# Patient Record
Sex: Male | Born: 1955 | Race: White | Hispanic: No | Smoking: Current every day smoker
Health system: Southern US, Community
[De-identification: ages and names within clinical notes are randomized; demographics above are authoritative.]

## PROBLEM LIST (undated history)

## (undated) ENCOUNTER — Emergency Department (HOSPITAL_COMMUNITY): Admission: EM | Source: Home / Self Care

## (undated) DIAGNOSIS — Z91148 Patient's other noncompliance with medication regimen for other reason: Secondary | ICD-10-CM

## (undated) DIAGNOSIS — B1920 Unspecified viral hepatitis C without hepatic coma: Secondary | ICD-10-CM

## (undated) DIAGNOSIS — G8929 Other chronic pain: Secondary | ICD-10-CM

## (undated) DIAGNOSIS — R569 Unspecified convulsions: Secondary | ICD-10-CM

## (undated) DIAGNOSIS — I10 Essential (primary) hypertension: Secondary | ICD-10-CM

## (undated) DIAGNOSIS — M1990 Unspecified osteoarthritis, unspecified site: Secondary | ICD-10-CM

## (undated) DIAGNOSIS — C61 Malignant neoplasm of prostate: Secondary | ICD-10-CM

## (undated) DIAGNOSIS — G4700 Insomnia, unspecified: Secondary | ICD-10-CM

## (undated) DIAGNOSIS — F329 Major depressive disorder, single episode, unspecified: Secondary | ICD-10-CM

## (undated) DIAGNOSIS — F1910 Other psychoactive substance abuse, uncomplicated: Secondary | ICD-10-CM

## (undated) DIAGNOSIS — F419 Anxiety disorder, unspecified: Secondary | ICD-10-CM

## (undated) DIAGNOSIS — D696 Thrombocytopenia, unspecified: Secondary | ICD-10-CM

## (undated) DIAGNOSIS — F32A Depression, unspecified: Secondary | ICD-10-CM

## (undated) DIAGNOSIS — E43 Unspecified severe protein-calorie malnutrition: Secondary | ICD-10-CM

## (undated) DIAGNOSIS — Z9114 Patient's other noncompliance with medication regimen: Secondary | ICD-10-CM

## (undated) DIAGNOSIS — M109 Gout, unspecified: Secondary | ICD-10-CM

## (undated) DIAGNOSIS — S0990XA Unspecified injury of head, initial encounter: Secondary | ICD-10-CM

## (undated) DIAGNOSIS — E119 Type 2 diabetes mellitus without complications: Secondary | ICD-10-CM

## (undated) DIAGNOSIS — M25522 Pain in left elbow: Secondary | ICD-10-CM

## (undated) HISTORY — PX: MULTIPLE TOOTH EXTRACTIONS: SHX2053

## (undated) HISTORY — PX: OTHER SURGICAL HISTORY: SHX169

## (undated) HISTORY — DX: Type 2 diabetes mellitus without complications: E11.9

## (undated) HISTORY — DX: Gout, unspecified: M10.9

## (undated) HISTORY — DX: Unspecified osteoarthritis, unspecified site: M19.90

## (undated) HISTORY — DX: Anxiety disorder, unspecified: F41.9

---

## 2002-06-02 ENCOUNTER — Emergency Department (HOSPITAL_COMMUNITY): Admission: EM | Admit: 2002-06-02 | Discharge: 2002-06-02 | Payer: Self-pay | Admitting: *Deleted

## 2002-06-02 ENCOUNTER — Encounter: Payer: Self-pay | Admitting: Emergency Medicine

## 2007-10-24 ENCOUNTER — Emergency Department (HOSPITAL_COMMUNITY): Admission: EM | Admit: 2007-10-24 | Discharge: 2007-10-24 | Payer: Self-pay | Admitting: Emergency Medicine

## 2011-03-27 LAB — BASIC METABOLIC PANEL
BUN: 17
CO2: 22
Calcium: 9.3
Chloride: 102
Creatinine, Ser: 1.35
GFR calc Af Amer: >60
GFR calc non Af Amer: 55 — ABNORMAL LOW
Glucose, Bld: 73
Potassium: 3.8
Sodium: 137

## 2011-03-27 LAB — DIFFERENTIAL
Basophils Absolute: 0
Basophils Relative: 0
Eosinophils Absolute: 0.2
Eosinophils Relative: 1
Lymphocytes Relative: 30
Lymphs Abs: 3.7
Monocytes Absolute: 1.2 — ABNORMAL HIGH
Monocytes Relative: 10
Neutro Abs: 7.2
Neutrophils Relative %: 58

## 2011-03-27 LAB — RAPID URINE DRUG SCREEN, HOSP PERFORMED
Amphetamines: POSITIVE — AB
Barbiturates: NOT DETECTED
Benzodiazepines: POSITIVE — AB
Cocaine: POSITIVE — AB
Opiates: NOT DETECTED
Tetrahydrocannabinol: POSITIVE — AB

## 2011-03-27 LAB — CBC
HCT: 44.8
Hemoglobin: 15.7
MCHC: 35.1
MCV: 95
Platelets: 182
RBC: 4.72
RDW: 13.7
WBC: 12.4 — ABNORMAL HIGH

## 2011-03-27 LAB — ETHANOL: Alcohol, Ethyl (B): 96 — ABNORMAL HIGH

## 2012-05-03 ENCOUNTER — Encounter (HOSPITAL_COMMUNITY): Payer: Self-pay

## 2012-05-03 ENCOUNTER — Emergency Department (HOSPITAL_COMMUNITY)
Admission: EM | Admit: 2012-05-03 | Discharge: 2012-05-03 | Discharge status: Against Medical Advice | Payer: Self-pay | Attending: Emergency Medicine | Admitting: Emergency Medicine

## 2012-05-03 DIAGNOSIS — Y9289 Other specified places as the place of occurrence of the external cause: Secondary | ICD-10-CM | POA: Insufficient documentation

## 2012-05-03 DIAGNOSIS — Y939 Activity, unspecified: Secondary | ICD-10-CM | POA: Insufficient documentation

## 2012-05-03 DIAGNOSIS — F1010 Alcohol abuse, uncomplicated: Secondary | ICD-10-CM | POA: Insufficient documentation

## 2012-05-03 DIAGNOSIS — IMO0002 Reserved for concepts with insufficient information to code with codable children: Secondary | ICD-10-CM | POA: Insufficient documentation

## 2012-05-03 DIAGNOSIS — S0180XA Unspecified open wound of other part of head, initial encounter: Secondary | ICD-10-CM | POA: Insufficient documentation
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
## 2013-06-12 ENCOUNTER — Other Ambulatory Visit (HOSPITAL_COMMUNITY): Payer: Self-pay | Admitting: Internal Medicine

## 2013-06-12 ENCOUNTER — Ambulatory Visit (HOSPITAL_COMMUNITY)
Admission: RE | Admit: 2013-06-12 | Discharge: 2013-06-12 | Disposition: A | Discharge status: Home / Self Care | Payer: Medicaid Other | Source: Ambulatory Visit | Attending: Internal Medicine | Admitting: Internal Medicine

## 2013-06-12 DIAGNOSIS — I10 Essential (primary) hypertension: Secondary | ICD-10-CM

## 2013-06-12 DIAGNOSIS — J40 Bronchitis, not specified as acute or chronic: Secondary | ICD-10-CM | POA: Insufficient documentation

## 2013-06-12 DIAGNOSIS — E119 Type 2 diabetes mellitus without complications: Secondary | ICD-10-CM | POA: Insufficient documentation

## 2013-06-12 DIAGNOSIS — F17200 Nicotine dependence, unspecified, uncomplicated: Secondary | ICD-10-CM | POA: Insufficient documentation

## 2013-06-22 ENCOUNTER — Other Ambulatory Visit (HOSPITAL_COMMUNITY): Payer: Self-pay | Admitting: Internal Medicine

## 2013-06-22 DIAGNOSIS — R911 Solitary pulmonary nodule: Secondary | ICD-10-CM

## 2013-06-24 ENCOUNTER — Other Ambulatory Visit (HOSPITAL_COMMUNITY): Payer: Medicaid Other

## 2013-06-29 ENCOUNTER — Other Ambulatory Visit (HOSPITAL_COMMUNITY): Payer: Medicaid Other

## 2013-06-30 ENCOUNTER — Ambulatory Visit (HOSPITAL_COMMUNITY)
Admission: RE | Admit: 2013-06-30 | Discharge: 2013-06-30 | Disposition: A | Discharge status: Home / Self Care | Payer: Medicaid Other | Source: Ambulatory Visit | Attending: Internal Medicine | Admitting: Internal Medicine

## 2013-06-30 DIAGNOSIS — R911 Solitary pulmonary nodule: Secondary | ICD-10-CM

## 2013-07-03 ENCOUNTER — Telehealth: Payer: Self-pay

## 2013-07-03 ENCOUNTER — Other Ambulatory Visit: Payer: Self-pay

## 2013-07-03 DIAGNOSIS — Z1211 Encounter for screening for malignant neoplasm of colon: Secondary | ICD-10-CM

## 2013-07-09 ENCOUNTER — Encounter (HOSPITAL_COMMUNITY): Payer: Self-pay | Admitting: Pharmacy Technician

## 2013-07-17 NOTE — Telephone Encounter (Signed)
Gastroenterology Pre-Procedure Review  Request Date: 07/03/2013 Requesting Physician: Dr. Legrand Rams  PATIENT REVIEW QUESTIONS: The patient responded to the following health history questions as indicated:    1. Diabetes Melitis: YES 2. Joint replacements in the past 12 months: no 3. Major health problems in the past 3 months: no 4. Has an artificial valve or MVP: no 5. Has a defibrillator: no 6. Has been advised in past to take antibiotics in advance of a procedure like teeth cleaning: no    MEDICATIONS & ALLERGIES:    Patient reports the following regarding taking any blood thinners:   Plavix? no Aspirin? no Coumadin? no  Patient confirms/reports the following medications:  Current Outpatient Prescriptions  Medication Sig Dispense Refill  . indomethacin (INDOCIN) 25 MG capsule Take 50 mg by mouth 3 (three) times daily.      Marland Kitchen lisinopril (PRINIVIL,ZESTRIL) 2.5 MG tablet Take 2.5 mg by mouth daily.      . metFORMIN (GLUCOPHAGE) 500 MG tablet Take 500 mg by mouth 2 (two) times daily with a meal.      . traZODone (DESYREL) 150 MG tablet Take 150 mg by mouth daily.      Marland Kitchen ALPRAZolam (XANAX) 0.5 MG tablet Take 0.5 mg by mouth at bedtime.      . meloxicam (MOBIC) 7.5 MG tablet Take 7.5 mg by mouth daily.      . nabumetone (RELAFEN) 750 MG tablet Take 750 mg by mouth daily.       No current facility-administered medications for this visit.    Patient confirms/reports the following allergies:  No Known Allergies  No orders of the defined types were placed in this encounter.    AUTHORIZATION INFORMATION Primary Insurance:   ID #:   Group #:  Pre-Cert / Auth required: Pre-Cert / Auth #:   Secondary Insurance:   ID #:  Group #:  Pre-Cert / Auth required: Pre-Cert / Auth #:   SCHEDULE INFORMATION: Procedure has been scheduled as follows:  Date: 07/24/2013                   Time: 9:30 AM  Location: [facility] Short Stay  This Gastroenterology Pre-Precedure Review Form is  being routed to the following provider(s): Barney Drain, MD

## 2013-07-17 NOTE — Telephone Encounter (Signed)
Discussed especially the fact that he has to have someone with him that is capable of signing him out and driving him home, and if not the hospital will cancel his procedure even if he shows up.

## 2013-07-21 ENCOUNTER — Other Ambulatory Visit: Payer: Self-pay

## 2013-07-21 DIAGNOSIS — Z79899 Other long term (current) drug therapy: Secondary | ICD-10-CM

## 2013-07-21 MED ORDER — PEG-KCL-NACL-NASULF-NA ASC-C 100 G PO SOLR: 1.0000 | ORAL | Status: DC

## 2013-07-21 NOTE — Telephone Encounter (Signed)
MOVI PREP SPLIT DOSING, REGULAR BREAKFAST. CLEAR LIQUIDS AFTER 9 AM.  CONTINUE GLUCOPHAGE.  Needs Phenergan 12. 5 mg iv in Preop.

## 2013-07-21 NOTE — Telephone Encounter (Signed)
Rx and instructions sent to the pharmacy.

## 2013-07-22 NOTE — Telephone Encounter (Signed)
Called the pharmacy and they said he had just came and picked up his Rx and instructions.

## 2013-07-22 NOTE — Telephone Encounter (Signed)
Phenergan order added.  

## 2013-07-22 NOTE — Telephone Encounter (Signed)
Many phone calls attempted today to let pt know his instructions were at the pharmacy. Vm full and could not leave message.

## 2013-07-23 ENCOUNTER — Telehealth: Payer: Self-pay

## 2013-07-23 NOTE — Telephone Encounter (Signed)
Per Darius Bump, Melanie called from Endo and said pt was mixed up about his appt. I called pt and he said he thought it was supposed to be next Thurs. I told him no, he was scheduled for 07/23/2013 and I had sent his instructions to the pharmacy for him. He picked it up yesterday, but he hasn't even opened the bag. I rescheduled it to 07/31/2013 per pt request at 9:15 and [facility] for Kim. He will call if any problems.He will just change the date on his paperwork.

## 2013-07-24 NOTE — Telephone Encounter (Signed)
REVIEWED.  

## 2013-07-27 ENCOUNTER — Telehealth: Payer: Self-pay

## 2013-07-27 NOTE — Telephone Encounter (Signed)
Pt called to cancel his TCS on Friday 07/31/13. He said he just does not want to have it done.

## 2013-07-31 ENCOUNTER — Encounter (HOSPITAL_COMMUNITY): Admission: RE | Payer: Self-pay | Source: Ambulatory Visit

## 2013-07-31 ENCOUNTER — Ambulatory Visit (HOSPITAL_COMMUNITY): Admission: RE | Admit: 2013-07-31 | Payer: Medicaid Other | Source: Ambulatory Visit | Admitting: Gastroenterology

## 2013-07-31 SURGERY — COLONOSCOPY
Anesthesia: Moderate Sedation

## 2013-10-15 ENCOUNTER — Ambulatory Visit (HOSPITAL_COMMUNITY)
Admission: RE | Admit: 2013-10-15 | Discharge: 2013-10-15 | Disposition: A | Discharge status: Home / Self Care | Payer: Medicaid Other | Source: Ambulatory Visit | Attending: Internal Medicine | Admitting: Internal Medicine

## 2013-10-15 ENCOUNTER — Other Ambulatory Visit (HOSPITAL_COMMUNITY): Payer: Self-pay | Admitting: Internal Medicine

## 2013-10-15 DIAGNOSIS — M549 Dorsalgia, unspecified: Secondary | ICD-10-CM

## 2013-10-15 DIAGNOSIS — M545 Low back pain, unspecified: Secondary | ICD-10-CM | POA: Insufficient documentation

## 2013-10-15 DIAGNOSIS — M47817 Spondylosis without myelopathy or radiculopathy, lumbosacral region: Secondary | ICD-10-CM | POA: Insufficient documentation

## 2013-11-05 ENCOUNTER — Ambulatory Visit (INDEPENDENT_AMBULATORY_CARE_PROVIDER_SITE_OTHER): Payer: Medicaid Other | Admitting: Orthopedic Surgery

## 2013-11-05 VITALS — BP 113/73 | Ht 67.0 in | Wt 177.0 lb

## 2013-11-05 DIAGNOSIS — M545 Low back pain, unspecified: Secondary | ICD-10-CM

## 2013-11-05 DIAGNOSIS — M47812 Spondylosis without myelopathy or radiculopathy, cervical region: Secondary | ICD-10-CM | POA: Insufficient documentation

## 2013-11-05 DIAGNOSIS — M542 Cervicalgia: Secondary | ICD-10-CM

## 2013-11-05 DIAGNOSIS — M48061 Spinal stenosis, lumbar region without neurogenic claudication: Secondary | ICD-10-CM | POA: Insufficient documentation

## 2013-11-05 MED ORDER — TRAMADOL-ACETAMINOPHEN 37.5-325 MG PO TABS: 1.0000 | ORAL_TABLET | ORAL | Status: DC | PRN

## 2013-11-05 MED ORDER — METHOCARBAMOL 500 MG PO TABS: 500.0000 mg | ORAL_TABLET | Freq: Four times a day (QID) | ORAL | Status: DC | PRN

## 2013-11-05 NOTE — Patient Instructions (Signed)
Chronic Back Pain  When back pain lasts longer than 3 months, it is called chronic back pain.People with chronic back pain often go through certain periods that are more intense (flare-ups).  CAUSES Chronic back pain can be caused by wear and tear (degeneration) on different structures in your back. These structures include:  The bones of your spine (vertebrae) and the joints surrounding your spinal cord and nerve roots (facets).  The strong, fibrous tissues that connect your vertebrae (ligaments). Degeneration of these structures may result in pressure on your nerves. This can lead to constant pain. HOME CARE INSTRUCTIONS  Avoid bending, heavy lifting, prolonged sitting, and activities which make the problem worse.  Take brief periods of rest throughout the day to reduce your pain. Lying down or standing usually is better than sitting while you are resting.  Take over-the-counter or prescription medicines only as directed by your caregiver. SEEK IMMEDIATE MEDICAL CARE IF:   You have weakness or numbness in one of your legs or feet.  You have trouble controlling your bladder or bowels.  You have nausea, vomiting, abdominal pain, shortness of breath, or fainting. Document Released: 07/26/2004 Document Revised: 09/10/2011 Document Reviewed: 06/02/2011 [facility] Patient Information 2014 [place], [place].    Physical therapy  Dose pack   ultracet   Robaxin   F/u with primary care doctor
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
## 2013-11-19 ENCOUNTER — Ambulatory Visit (HOSPITAL_COMMUNITY): Payer: Medicaid Other | Attending: Orthopedic Surgery | Admitting: Physical Therapy

## 2013-12-01 ENCOUNTER — Ambulatory Visit (HOSPITAL_COMMUNITY): Payer: Medicaid Other | Admitting: Physical Therapy

## 2013-12-07 ENCOUNTER — Ambulatory Visit (HOSPITAL_COMMUNITY): Payer: Medicaid Other | Admitting: Physical Therapy

## 2013-12-10 ENCOUNTER — Ambulatory Visit (INDEPENDENT_AMBULATORY_CARE_PROVIDER_SITE_OTHER): Payer: Medicaid Other | Admitting: Otolaryngology

## 2013-12-10 DIAGNOSIS — H6120 Impacted cerumen, unspecified ear: Secondary | ICD-10-CM

## 2013-12-10 DIAGNOSIS — H9319 Tinnitus, unspecified ear: Secondary | ICD-10-CM

## 2013-12-10 DIAGNOSIS — H903 Sensorineural hearing loss, bilateral: Secondary | ICD-10-CM

## 2013-12-28 ENCOUNTER — Other Ambulatory Visit: Payer: Self-pay | Admitting: *Deleted

## 2013-12-28 ENCOUNTER — Ambulatory Visit (HOSPITAL_COMMUNITY): Payer: Medicaid Other | Attending: Orthopedic Surgery | Admitting: Physical Therapy

## 2013-12-28 MED ORDER — TRAMADOL-ACETAMINOPHEN 37.5-325 MG PO TABS: 1.0000 | ORAL_TABLET | ORAL | Status: DC | PRN

## 2014-06-29 ENCOUNTER — Emergency Department (HOSPITAL_COMMUNITY)
Admission: EM | Admit: 2014-06-29 | Discharge: 2014-06-30 | Disposition: A | Discharge status: Home / Self Care | Payer: Medicaid Other | Attending: Emergency Medicine | Admitting: Emergency Medicine

## 2014-06-29 ENCOUNTER — Encounter (HOSPITAL_COMMUNITY): Payer: Self-pay | Admitting: *Deleted

## 2014-06-29 DIAGNOSIS — Z79899 Other long term (current) drug therapy: Secondary | ICD-10-CM | POA: Diagnosis not present

## 2014-06-29 DIAGNOSIS — E1165 Type 2 diabetes mellitus with hyperglycemia: Secondary | ICD-10-CM | POA: Diagnosis present

## 2014-06-29 DIAGNOSIS — M1990 Unspecified osteoarthritis, unspecified site: Secondary | ICD-10-CM | POA: Insufficient documentation

## 2014-06-29 DIAGNOSIS — R739 Hyperglycemia, unspecified: Secondary | ICD-10-CM

## 2014-06-29 DIAGNOSIS — Z791 Long term (current) use of non-steroidal anti-inflammatories (NSAID): Secondary | ICD-10-CM | POA: Diagnosis not present

## 2014-06-29 DIAGNOSIS — Z720 Tobacco use: Secondary | ICD-10-CM | POA: Diagnosis not present

## 2014-06-29 LAB — CBC WITH DIFFERENTIAL/PLATELET
Basophils Absolute: 0 10*3/uL (ref 0.0–0.1)
Basophils Relative: 1 % (ref 0–1)
EOS ABS: 0.1 10*3/uL (ref 0.0–0.7)
EOS PCT: 1 % (ref 0–5)
HCT: 41 % (ref 39.0–52.0)
Hemoglobin: 15.1 g/dL (ref 13.0–17.0)
Lymphocytes Relative: 35 % (ref 12–46)
Lymphs Abs: 1.9 10*3/uL (ref 0.7–4.0)
MCH: 35.9 pg — AB (ref 26.0–34.0)
MCHC: 36.8 g/dL — ABNORMAL HIGH (ref 30.0–36.0)
MCV: 97.4 fL (ref 78.0–100.0)
MONO ABS: 0.5 10*3/uL (ref 0.1–1.0)
Monocytes Relative: 9 % (ref 3–12)
NEUTROS ABS: 3 10*3/uL (ref 1.7–7.7)
Neutrophils Relative %: 54 % (ref 43–77)
Platelets: 67 10*3/uL — ABNORMAL LOW (ref 150–400)
RBC: 4.21 MIL/uL — ABNORMAL LOW (ref 4.22–5.81)
RDW: 12.8 % (ref 11.5–15.5)
WBC: 5.4 10*3/uL (ref 4.0–10.5)

## 2014-06-29 LAB — COMPREHENSIVE METABOLIC PANEL
ALT: 78 U/L — AB (ref 0–53)
AST: 74 U/L — ABNORMAL HIGH (ref 0–37)
Albumin: 3.2 g/dL — ABNORMAL LOW (ref 3.5–5.2)
Alkaline Phosphatase: 110 U/L (ref 39–117)
Anion gap: 6 (ref 5–15)
BUN: 23 mg/dL (ref 6–23)
CO2: 24 mmol/L (ref 19–32)
CREATININE: 1.58 mg/dL — AB (ref 0.50–1.35)
Calcium: 8.7 mg/dL (ref 8.4–10.5)
Chloride: 98 mEq/L (ref 96–112)
GFR calc Af Amer: 54 mL/min — ABNORMAL LOW (ref >90)
GFR calc non Af Amer: 46 mL/min — ABNORMAL LOW (ref >90)
Glucose, Bld: 661 mg/dL (ref 70–99)
Potassium: 4.6 mmol/L (ref 3.5–5.1)
Sodium: 128 mmol/L — ABNORMAL LOW (ref 135–145)
Total Bilirubin: 0.7 mg/dL (ref 0.3–1.2)
Total Protein: 6.9 g/dL (ref 6.0–8.3)

## 2014-06-29 LAB — CBG MONITORING, ED
Glucose-Capillary: 389 mg/dL — ABNORMAL HIGH (ref 70–99)
Glucose-Capillary: >600 mg/dL (ref 70–99)

## 2014-06-29 LAB — TROPONIN I: Troponin I: <0.03 ng/mL (ref <0.031)

## 2014-06-29 MED ORDER — SODIUM CHLORIDE 0.9 % IV BOLUS (SEPSIS)
2000.0000 mL | Freq: Once | INTRAVENOUS | Status: AC
  Administered 2014-06-29: 2000 mL via INTRAVENOUS

## 2014-06-29 MED ORDER — INSULIN ASPART 100 UNIT/ML IV SOLN
10.0000 [IU] | Freq: Once | INTRAVENOUS | Status: AC
  Administered 2014-06-29: 10 [IU] via INTRAVENOUS

## 2014-06-29 NOTE — ED Notes (Signed)
CRITICAL VALUE ALERT  Critical value received:  Glucose 661  Date of notification:  06/29/2014  Time of notification:  2257  Critical value read back:Yes.    Nurse who received alert:  Holli Humbles  MD notified (1st page):  Zammitt  Time of first page:  2258  MD notified (2nd page):  Time of second page:  Responding MD:  Zammitt  Time MD responded:  2258

## 2014-06-29 NOTE — ED Provider Notes (Signed)
CSN: [id number]     Arrival date & time 06/29/14  2118 History  This chart was scribed for Orpah Greek, MD by Martinique Peace, ED Scribe. The patient was seen in [place]. The patient's care was started at 11:03 PM.    Chief Complaint  Patient presents with  . Hyperglycemia      Patient is a 58 y.o. male presenting with hyperglycemia. The history is provided by the patient. No language interpreter was used.  Hyperglycemia Blood sugar level PTA:  Above 500 Onset quality:  Sudden Progression:  Unchanged Relieved by:  None tried Associated symptoms: no blurred vision, no chest pain, no fever and no shortness of breath    HPI Comments: Juan Juarez is a 58 y.o. male who presents to the Emergency Department complaining of hyperglycemia. Pt reports blood sugar was greater than 500 when he checked levels today. Pt states that he has been taking his medication like advised. He reports his legs have also been bothering him and adds that they feel as if something is "sticking him in the bottom of his feet". No complaints of cough, SOB, chest pain, fever, or diarrhea.  History of DM.   Past Medical History  Diagnosis Date  . Diabetes mellitus without complication   . Arthritis    History reviewed. No pertinent past surgical history. History reviewed. No pertinent family history. History  Substance Use Topics  . Smoking status: Current Every Day Smoker  . Smokeless tobacco: Not on file  . Alcohol Use: Yes    Review of Systems  Constitutional: Negative for fever.  Eyes: Negative for blurred vision.  Respiratory: Negative for cough and shortness of breath.   Cardiovascular: Negative for chest pain.       Hyperglycemia.   Gastrointestinal: Negative for diarrhea.  All other systems reviewed and are negative.     Allergies  Review of patient's allergies indicates no known allergies.  Home Medications   Prior to Admission medications   Medication Sig Start Date End  Date Taking? Authorizing Provider  allopurinol (ZYLOPRIM) 100 MG tablet Take 2 tablets by mouth daily as needed. For gout flare 04/09/14  Yes Historical Provider, MD  ALPRAZolam Duanne Moron) 0.5 MG tablet Take 0.5 mg by mouth at bedtime.   Yes Historical Provider, MD  lisinopril (PRINIVIL,ZESTRIL) 2.5 MG tablet Take 2.5 mg by mouth daily.   Yes Historical Provider, MD  meloxicam (MOBIC) 7.5 MG tablet Take 7.5 mg by mouth daily.   Yes Historical Provider, MD  TRADJENTA 5 MG TABS tablet Take 5 mg by mouth daily. 06/09/14  Yes Historical Provider, MD  methocarbamol (ROBAXIN) 500 MG tablet Take 1 tablet (500 mg total) by mouth every 6 (six) hours as needed for muscle spasms. Patient not taking: Reported on 06/29/2014 11/05/13   Carole Civil, MD  peg 3350 powder (MOVIPREP) 100 G SOLR Take 1 kit (200 g total) by mouth as directed. Patient not taking: Reported on 06/29/2014 07/21/13   Danie Binder, MD  traMADol-acetaminophen (ULTRACET) 37.5-325 MG per tablet Take 1 tablet by mouth every 4 (four) hours as needed. Patient not taking: Reported on 06/29/2014 12/28/13   Carole Civil, MD   BP 117/82 mmHg  Pulse 66  Temp(Src) 98 F (36.7 C) (Oral)  Resp 18  Ht $R'5\' 8"'SJ$  (1.727 m)  Wt 183 lb (83.008 kg)  BMI 27.83 kg/m2  SpO2 99% Physical Exam  Constitutional: He is oriented to person, place, and time. He appears well-developed and well-nourished. No  distress.  HENT:  Head: Normocephalic and atraumatic.  Right Ear: Hearing normal.  Left Ear: Hearing normal.  Nose: Nose normal.  Mouth/Throat: Oropharynx is clear and moist and mucous membranes are normal.  Eyes: Conjunctivae and EOM are normal. Pupils are equal, round, and reactive to light.  Neck: Normal range of motion. Neck supple.  Cardiovascular: Regular rhythm, S1 normal and S2 normal.  Exam reveals no gallop and no friction rub.   No murmur heard. Pulmonary/Chest: Effort normal and breath sounds normal. No respiratory distress. He exhibits no  tenderness.  Abdominal: Soft. Normal appearance and bowel sounds are normal. There is no hepatosplenomegaly. There is no tenderness. There is no rebound, no guarding, no tenderness at McBurney's point and negative Murphy's sign. No hernia.  Musculoskeletal: Normal range of motion.  Neurological: He is alert and oriented to person, place, and time. He has normal strength. No cranial nerve deficit or sensory deficit. Coordination normal. GCS eye subscore is 4. GCS verbal subscore is 5. GCS motor subscore is 6.  Skin: Skin is warm, dry and intact. No rash noted. No cyanosis.  Psychiatric: He has a normal mood and affect. His speech is normal and behavior is normal. Thought content normal.    ED Course  Procedures (including critical care time) Labs Review Labs Reviewed  CBC WITH DIFFERENTIAL - Abnormal; Notable for the following:    RBC 4.21 (*)    MCH 35.9 (*)    MCHC 36.8 (*)    Platelets 67 (*)    All other components within normal limits  COMPREHENSIVE METABOLIC PANEL - Abnormal; Notable for the following:    Sodium 128 (*)    Glucose, Bld 661 (*)    Creatinine, Ser 1.58 (*)    Albumin 3.2 (*)    AST 74 (*)    ALT 78 (*)    GFR calc non Af Amer 46 (*)    GFR calc Af Amer 54 (*)    All other components within normal limits  CBG MONITORING, ED - Abnormal; Notable for the following:    Glucose-Capillary >600 (*)    All other components within normal limits  I-STAT CHEM 8, ED - Abnormal; Notable for the following:    Sodium 129 (*)    BUN 25 (*)    Creatinine, Ser 1.50 (*)    Glucose, Bld 668 (*)    All other components within normal limits  CBG MONITORING, ED - Abnormal; Notable for the following:    Glucose-Capillary 389 (*)    All other components within normal limits  TROPONIN I    Imaging Review No results found.   EKG Interpretation   Date/Time:  Tuesday June 29 2014 23:12:32 EST Ventricular Rate:  69 PR Interval:  197 QRS Duration: 95 QT Interval:   409 QTC Calculation: 438 R Axis:   14 Text Interpretation:  Sinus rhythm Normal ECG Confirmed by Dia Jefferys  MD,  Eshal Propps [phone]) on 06/29/2014 11:49:03 PM     Medications  sodium chloride 0.9 % bolus 2,000 mL (0 mLs Intravenous Stopped 06/30/14 0102)  insulin aspart (novoLOG) injection 10 Units (10 Units Intravenous Given 06/29/14 2313)  insulin aspart (novoLOG) injection 15 Units (15 Units Subcutaneous Given 06/30/14 0105)   11:06 PM- Treatment plan was discussed with patient who verbalizes understanding and agrees.   MDM   Final diagnoses:  None   hyperglycemia  Patient presents to the ER for evaluation of elevated blood sugar. Elevation is of unclear etiology. Patient denies having any  recent illness. His workup was unremarkable. This included cardiac workup. He is not expressing any chest pain. He has not had any flu or upper respiratory symptoms. Patient treated with IV fluids and insulin with improvement. He does not have any evidence of ketosis or acidosis. Patient to be discharged, continue his medications and follow-up with his primary care doctor.  I personally performed the services described in this documentation, which was scribed in my presence. The recorded information has been reviewed and is accurate.   Orpah Greek, MD 06/30/14 [phone]

## 2014-06-29 NOTE — ED Notes (Signed)
Hyperglycemia , pt checked his cbg yesterday for first time in months .  Feels itchy

## 2014-06-29 NOTE — ED Notes (Signed)
Patient states blood sugar was greater than 500 today when checked at home. Patient states he has a history of diabetes, but is not compliant with following his blood sugars to regulate them properly. Patient A&OX4 at this time, no needs voiced.

## 2014-06-30 LAB — CBG MONITORING, ED
GLUCOSE-CAPILLARY: 358 mg/dL — AB (ref 70–99)
GLUCOSE-CAPILLARY: 164 mg/dL — AB (ref 70–99)

## 2014-06-30 LAB — I-STAT CHEM 8, ED
BUN: 25 mg/dL — AB (ref 6–23)
CALCIUM ION: 1.16 mmol/L (ref 1.12–1.23)
CHLORIDE: 97 meq/L (ref 96–112)
CREATININE: 1.5 mg/dL — AB (ref 0.50–1.35)
Glucose, Bld: 668 mg/dL (ref 70–99)
HCT: 46 % (ref 39.0–52.0)
Hemoglobin: 15.6 g/dL (ref 13.0–17.0)
Potassium: 4.6 mmol/L (ref 3.5–5.1)
Sodium: 129 mmol/L — ABNORMAL LOW (ref 135–145)
TCO2: 21 mmol/L (ref 0–100)

## 2014-06-30 MED ORDER — INSULIN ASPART 100 UNIT/ML ~~LOC~~ SOLN
10.0000 [IU] | Freq: Once | SUBCUTANEOUS | Status: AC
  Administered 2014-06-30: 10 [IU] via SUBCUTANEOUS
  Filled 2014-06-30: qty 1

## 2014-06-30 MED ORDER — INSULIN ASPART 100 UNIT/ML ~~LOC~~ SOLN
15.0000 [IU] | Freq: Once | SUBCUTANEOUS | Status: AC
Start: 2014-06-30 — End: 2014-06-30
  Administered 2014-06-30: 15 [IU] via SUBCUTANEOUS
  Filled 2014-06-30: qty 1

## 2014-06-30 MED ORDER — SODIUM CHLORIDE 0.9 % IV SOLN
Freq: Once | INTRAVENOUS | Status: AC
  Administered 2014-06-30: 03:00:00 via INTRAVENOUS

## 2014-06-30 NOTE — Discharge Instructions (Signed)
High Blood Sugar °High blood sugar (hyperglycemia) means that the level of sugar in your blood is higher than it should be. Signs of high blood sugar include: °· Feeling thirsty. °· Frequent peeing (urinating). °· Feeling tired or sleepy. °· Dry mouth. °· Vision changes. °· Feeling weak. °· Feeling hungry but losing weight. °· Numbness and tingling in your hands or feet. °· Headache. °When you ignore these signs, your blood sugar may keep going up. These problems may get worse, and other problems may begin. °HOME CARE °· Check your blood sugars as told by your doctor. Write down the numbers with the date and time. °· Take the right amount of insulin or diabetes pills at the right time. Write down the dose with date and time. °· Refill your insulin or diabetes pills before running out. °· Watch what you eat. Follow your meal plan. °· Drink liquids without sugar, such as water. Check with your doctor if you have kidney or heart disease. °· Follow your doctor's orders for exercise. Exercise at the same time of day. °· Keep your doctor's appointments. °GET HELP RIGHT AWAY IF:  °· You have trouble thinking or are confused. °· You have fast breathing with fruity smelling breath. °· You pass out (faint). °· You have 2 to 3 days of high blood sugars and you do not know why. °· You have chest pain. °· You are feeling sick to your stomach (nauseous) or throwing up (vomiting). °· You have sudden vision changes. °MAKE SURE YOU:  °· Understand these instructions. °· Will watch your condition. °· Will get help right away if you are not doing well or get worse. °Document Released: 04/15/2009 Document Revised: 09/10/2011 Document Reviewed: 04/15/2009 °[employer]® Patient Information ©2015 [employer]. This information is not intended to replace advice given to you by your health care provider. Make sure you discuss any questions you have with your health care provider. ° °

## 2014-06-30 NOTE — ED Notes (Signed)
Patient verbalizes understanding of discharge instructions, home care and follow up care. Patient ambulatory out of department at this time. 

## 2014-07-01 ENCOUNTER — Encounter (HOSPITAL_COMMUNITY): Payer: Self-pay | Admitting: *Deleted

## 2014-07-01 ENCOUNTER — Emergency Department (HOSPITAL_COMMUNITY)
Admission: EM | Admit: 2014-07-01 | Discharge: 2014-07-01 | Disposition: A | Discharge status: Home / Self Care | Payer: Medicaid Other | Attending: Emergency Medicine | Admitting: Emergency Medicine

## 2014-07-01 ENCOUNTER — Emergency Department (HOSPITAL_COMMUNITY): Payer: Medicaid Other

## 2014-07-01 DIAGNOSIS — Z79899 Other long term (current) drug therapy: Secondary | ICD-10-CM | POA: Insufficient documentation

## 2014-07-01 DIAGNOSIS — M1990 Unspecified osteoarthritis, unspecified site: Secondary | ICD-10-CM | POA: Diagnosis not present

## 2014-07-01 DIAGNOSIS — E1165 Type 2 diabetes mellitus with hyperglycemia: Secondary | ICD-10-CM | POA: Insufficient documentation

## 2014-07-01 DIAGNOSIS — R739 Hyperglycemia, unspecified: Secondary | ICD-10-CM

## 2014-07-01 DIAGNOSIS — Z720 Tobacco use: Secondary | ICD-10-CM | POA: Insufficient documentation

## 2014-07-01 LAB — COMPREHENSIVE METABOLIC PANEL
ALT: 89 U/L — ABNORMAL HIGH (ref 0–53)
AST: 84 U/L — ABNORMAL HIGH (ref 0–37)
Albumin: 3.3 g/dL — ABNORMAL LOW (ref 3.5–5.2)
Alkaline Phosphatase: 86 U/L (ref 39–117)
Anion gap: 6 (ref 5–15)
BILIRUBIN TOTAL: 0.9 mg/dL (ref 0.3–1.2)
BUN: 14 mg/dL (ref 6–23)
CALCIUM: 8.7 mg/dL (ref 8.4–10.5)
CO2: 25 mmol/L (ref 19–32)
Chloride: 99 mEq/L (ref 96–112)
Creatinine, Ser: 1.22 mg/dL (ref 0.50–1.35)
GFR calc Af Amer: 73 mL/min — ABNORMAL LOW (ref >90)
GFR, EST NON AFRICAN AMERICAN: 63 mL/min — AB (ref >90)
Glucose, Bld: 550 mg/dL — ABNORMAL HIGH (ref 70–99)
Potassium: 4.4 mmol/L (ref 3.5–5.1)
SODIUM: 130 mmol/L — AB (ref 135–145)
TOTAL PROTEIN: 6.9 g/dL (ref 6.0–8.3)

## 2014-07-01 LAB — CBG MONITORING, ED
GLUCOSE-CAPILLARY: 527 mg/dL — AB (ref 70–99)
GLUCOSE-CAPILLARY: 388 mg/dL — AB (ref 70–99)

## 2014-07-01 LAB — CBC WITH DIFFERENTIAL/PLATELET
BASOS ABS: 0 10*3/uL (ref 0.0–0.1)
Basophils Relative: 1 % (ref 0–1)
EOS PCT: 1 % (ref 0–5)
Eosinophils Absolute: 0 10*3/uL (ref 0.0–0.7)
HCT: 40.7 % (ref 39.0–52.0)
Hemoglobin: 14.5 g/dL (ref 13.0–17.0)
LYMPHS PCT: 36 % (ref 12–46)
Lymphs Abs: 1.8 10*3/uL (ref 0.7–4.0)
MCH: 34.9 pg — AB (ref 26.0–34.0)
MCHC: 35.6 g/dL (ref 30.0–36.0)
MCV: 97.8 fL (ref 78.0–100.0)
Monocytes Absolute: 0.5 10*3/uL (ref 0.1–1.0)
Monocytes Relative: 10 % (ref 3–12)
NEUTROS ABS: 2.6 10*3/uL (ref 1.7–7.7)
Neutrophils Relative %: 52 % (ref 43–77)
PLATELETS: 58 10*3/uL — AB (ref 150–400)
RBC: 4.16 MIL/uL — ABNORMAL LOW (ref 4.22–5.81)
RDW: 12.8 % (ref 11.5–15.5)
Smear Review: DECREASED
WBC: 5 10*3/uL (ref 4.0–10.5)

## 2014-07-01 LAB — URINE MICROSCOPIC-ADD ON: WBC, UA: NONE SEEN WBC/hpf (ref <3)

## 2014-07-01 LAB — URINALYSIS, ROUTINE W REFLEX MICROSCOPIC
BILIRUBIN URINE: NEGATIVE
Glucose, UA: >1000 mg/dL — AB
HGB URINE DIPSTICK: NEGATIVE
Ketones, ur: NEGATIVE mg/dL
Leukocytes, UA: NEGATIVE
Nitrite: NEGATIVE
PROTEIN: NEGATIVE mg/dL
Specific Gravity, Urine: 1.01 (ref 1.005–1.030)
Urobilinogen, UA: 0.2 mg/dL (ref 0.0–1.0)
pH: 6 (ref 5.0–8.0)

## 2014-07-01 MED ORDER — SODIUM CHLORIDE 0.9 % IV BOLUS (SEPSIS)
1000.0000 mL | Freq: Once | INTRAVENOUS | Status: AC
  Administered 2014-07-01: 1000 mL via INTRAVENOUS

## 2014-07-01 MED ORDER — SODIUM CHLORIDE 0.9 % IV SOLN
INTRAVENOUS | Status: DC
  Filled 2014-07-01: qty 2.5

## 2014-07-01 MED ORDER — METFORMIN HCL 500 MG PO TABS
500.0000 mg | ORAL_TABLET | Freq: Once | ORAL | Status: AC
  Administered 2014-07-01: 500 mg via ORAL
  Filled 2014-07-01: qty 1

## 2014-07-01 MED ORDER — METFORMIN HCL 1000 MG PO TABS: 500.0000 mg | ORAL_TABLET | Freq: Two times a day (BID) | ORAL | Status: DC

## 2014-07-01 NOTE — Discharge Instructions (Signed)

## 2014-07-01 NOTE — ED Notes (Signed)
Patient reports he has had high blood sugars "for some time".  States he was treated here for same 2 nights ago, but the next morning BG was over 300.  Denies any changes in medications, diet or feeling ill.  Has been on Tradjenta x 6 months and states he has been taking it every morning.

## 2014-07-01 NOTE — ED Notes (Signed)
Patient verbalizes understanding of discharge instructions, prescription medications, home care, and follow up care. Patient ambulatory out of department at this time. 

## 2014-07-01 NOTE — ED Provider Notes (Signed)
CSN: [id number]     Arrival date & time 07/01/14  2052 History  This chart was scribed for Shaune Pollack, MD by Jeanell Sparrow, ED Scribe. This patient was seen in room APA03/APA03 and the patient's care was started at 9:25 PM.   Chief Complaint  Patient presents with  . Hyperglycemia   HPI Comments: Reports baseline bs are 100s but elevated today.   Patient is a 58 y.o. male presenting with hyperglycemia. The history is provided by the patient. No language interpreter was used.  Hyperglycemia Blood sugar level PTA:  100-200 Severity:  Moderate Onset quality:  Gradual Duration:  2 days Timing:  Intermittent Progression:  Unchanged Chronicity:  Chronic Diabetes status:  Controlled with oral medications Current diabetic therapy:  Tradjenta Context: change in medication   Relieved by:  None tried Ineffective treatments:  None tried Associated symptoms: no fever    HPI Comments: Juan Juarez is a 58 y.o. male who presents to the Emergency Department complaining of hyperglycemia that started 2 days ago. He reports that he has been having high blood sugar for a while because his tradjenta he has been on for 3-4 months is not working. He states that he came to the ED 2 nights ago with a blood sugar level of 668 and sent home with tradjenta. He reports that today he checked his blood sugar today and it was high, ranging from 100 to 200. He states that his DM is usually treated with metformin. He denies any fever, chills, or cough.   Past Medical History  Diagnosis Date  . Diabetes mellitus without complication   . Arthritis    History reviewed. No pertinent past surgical history. History reviewed. No pertinent family history. History  Substance Use Topics  . Smoking status: Current Every Day Smoker  . Smokeless tobacco: Not on file  . Alcohol Use: Yes    Review of Systems  Constitutional: Negative for fever and chills.  Respiratory: Negative for cough.   All other systems  reviewed and are negative.   Allergies  Review of patient's allergies indicates no known allergies.  Home Medications   Prior to Admission medications   Medication Sig Start Date End Date Taking? Authorizing Provider  allopurinol (ZYLOPRIM) 100 MG tablet Take 2 tablets by mouth daily as needed. For gout flare 04/09/14  Yes Historical Provider, MD  ALPRAZolam Duanne Moron) 0.5 MG tablet Take 0.5 mg by mouth at bedtime.   Yes Historical Provider, MD  lisinopril (PRINIVIL,ZESTRIL) 2.5 MG tablet Take 2.5 mg by mouth daily.   Yes Historical Provider, MD  meloxicam (MOBIC) 7.5 MG tablet Take 7.5 mg by mouth daily.   Yes Historical Provider, MD  TRADJENTA 5 MG TABS tablet Take 5 mg by mouth daily. 06/09/14  Yes Historical Provider, MD  traMADol-acetaminophen (ULTRACET) 37.5-325 MG per tablet Take 1 tablet by mouth every 4 (four) hours as needed. Patient not taking: Reported on 06/29/2014 12/28/13   Carole Civil, MD   BP 120/81 mmHg  Pulse 77  Temp(Src) 98.9 F (37.2 C) (Oral)  Resp 20  Ht 5\' 8"  (1.727 m)  Wt 183 lb (83.008 kg)  BMI 27.83 kg/m2  SpO2 100% Physical Exam  Constitutional: He is oriented to person, place, and time. He appears well-developed and well-nourished. No distress.  HENT:  Head: Normocephalic and atraumatic.  Right Ear: External ear normal.  Left Ear: External ear normal.  Nose: Nose normal.  Mouth/Throat: Oropharynx is clear and moist.  Eyes: Conjunctivae and EOM  are normal. Pupils are equal, round, and reactive to light.  Neck: Normal range of motion. Neck supple. No JVD present. No tracheal deviation present. No thyromegaly present.  Cardiovascular: Normal rate, regular rhythm, normal heart sounds and intact distal pulses.   Pulmonary/Chest: Effort normal and breath sounds normal. No stridor. No respiratory distress.  Abdominal: Soft. Bowel sounds are normal. He exhibits no distension. There is no tenderness.  Musculoskeletal: Normal range of motion.   Lymphadenopathy:    He has no cervical adenopathy.  Neurological: He is alert and oriented to person, place, and time.  Skin: Skin is warm and dry.  Psychiatric: He has a normal mood and affect. His behavior is normal. Judgment and thought content normal.  Nursing note and vitals reviewed.   ED Course  Procedures (including critical care time) DIAGNOSTIC STUDIES: Oxygen Saturation is 100% on RA, normal by my interpretation.    COORDINATION OF CARE: 9:29 PM- Pt advised of plan for treatment which includes medication and labs and pt agrees.  Labs Review Labs Reviewed  CBC WITH DIFFERENTIAL - Abnormal; Notable for the following:    RBC 4.16 (*)    MCH 34.9 (*)    Platelets 58 (*)    All other components within normal limits  COMPREHENSIVE METABOLIC PANEL - Abnormal; Notable for the following:    Sodium 130 (*)    Glucose, Bld 550 (*)    Albumin 3.3 (*)    AST 84 (*)    ALT 89 (*)    GFR calc non Af Amer 63 (*)    GFR calc Af Amer 73 (*)    All other components within normal limits  CBG MONITORING, ED - Abnormal; Notable for the following:    Glucose-Capillary 527 (*)    All other components within normal limits  CBG MONITORING, ED - Abnormal; Notable for the following:    Glucose-Capillary 388 (*)    All other components within normal limits  BLOOD GAS, VENOUS  URINALYSIS, ROUTINE W REFLEX MICROSCOPIC  I-STAT TROPOININ, ED    Imaging Review No results found.   EKG Interpretation   Date/Time:  Thursday July 01 2014 21:59:36 EST Ventricular Rate:  74 PR Interval:  189 QRS Duration: 95 QT Interval:  381 QTC Calculation: 423 R Axis:   -2 Text Interpretation:  Normal sinus rhythm Normal ECG Confirmed by Adriel Desrosier MD,  Andee Poles ([id number]) on 07/01/2014 10:24:54 PM      MDM   Final diagnoses:  Hyperglycemia   58 year old male history of non-insulin-dependent diabetes who has been taking Tradjenta for his diabetes. He has had several episodes of high blood sugar  over the past week. He was seen here 2 days ago and had his blood sugar brought down but it has become very elevated again today. He denies any changes in diet, habits, or recent illnesses. Workup here reveals no evidence of DKA. Blood sugar came down to 380 with IV fluids. He thinks that his medication was changed from metformin because his doctor thought that it was causing a rash. He states that there is been no change in the rash. Plan to restart his metformin. I have discussed return precautions and need for follow-up and patient voices understanding.  I personally performed the services described in this documentation, which was scribed in my presence. The recorded information has been reviewed and considered.   Shaune Pollack, MD 07/02/14 [phone]

## 2014-10-06 ENCOUNTER — Encounter: Payer: Medicaid Other | Attending: "Endocrinology | Admitting: Nutrition

## 2014-10-06 ENCOUNTER — Encounter: Payer: Self-pay | Admitting: Nutrition

## 2014-10-06 VITALS — Ht 67.0 in | Wt 185.0 lb

## 2014-10-06 DIAGNOSIS — Z713 Dietary counseling and surveillance: Secondary | ICD-10-CM | POA: Insufficient documentation

## 2014-10-06 DIAGNOSIS — E1165 Type 2 diabetes mellitus with hyperglycemia: Secondary | ICD-10-CM

## 2014-10-06 DIAGNOSIS — E118 Type 2 diabetes mellitus with unspecified complications: Secondary | ICD-10-CM | POA: Diagnosis not present

## 2014-10-06 DIAGNOSIS — IMO0002 Reserved for concepts with insufficient information to code with codable children: Secondary | ICD-10-CM

## 2014-10-06 NOTE — Patient Instructions (Signed)
Goal:  1. Follow My Plate as discussed. 2. Eat three meals per day and do not skip meals. 3. Take medications as prescribed. 4. Increase water intake to 5 bottles per day and avoid diet sodas.. 5. Avoid snacks, cakes, cookies, sweets, desserts and junk food. 6. Test blood sugars twice a day; in the morning and at night before bed and record on blood sugar log sheet. Bring to all appointments. 7. Get A1C down to 8% in three months. 8. Walk 30-45 minutes most days of the week.

## 2014-10-06 NOTE — Progress Notes (Signed)
  Medical Nutrition Therapy:  Appt start time: 7867 end time:  1630.  Assessment:  Primary concerns today: DIabetes. LIves with his sister. He and his sister do the cooking and shopping.  Most foods are baked, broiled and fried. Not working.  Testing blood sugars twice a day. FBS 103 mg/dl. HS blood sugars are in the 120's.    Sees a therapist but is not on any antidepressant medications.  Physical activity: Likes to fish and yard chores.   Metformin 1g BID and Invokana 100 mg daily. No longer on Tradjenta since he has been taking the Invokana. .  Trying to quit smoking. On Nicoderm patch right now. Has problems with his back.  Diet is low in fresh fruits, vegetables and whole grains. Diet is high in processed foods. LImited income.  Preferred Learning Style:   Auditory  Hands on   Learning Readiness:   Ready  Change in progress   MEDICATIONS: See list   DIETARY INTAKE:   24-hr recall:  B ( AM): 2 eggs and 2 slices of toast and 1 spoon ful of grape jelly, coffee, water  Snk ( AM): none L ( PM): [employer] and water Snk ( PM): [place] ham and cheese D ( PM): BQ Chicken, broccoli with cheese,  Corn on cob, toast, water Snk ( PM): Sometimes eats bowl of cereal in middle of the night when hungry Beverages: water.  Smokes 3-5 cigarrettes, per day. Drinking beers 0-4 weekly.  Usual physical activity: none  Estimated energy needs: 1800 calories 200 g carbohydrates 135 g protein 50 g fat  Progress Towards Goal(s):  In progress.   Nutritional Diagnosis:  NB-1.1 Food and nutrition-related knowledge deficit As related to Diabetes.  As evidenced by A1C was 10.5%.    Intervention:  Nutrition counseling and nutrition education provided on diabetes disease, target ranges for blood sugars, signs/symptoms of hyper/hypoglycemia and treatment of both, My Plate, portions sizes, importance of exercise and prevention of complications of DM.  Goal:  1. Follow My Plate as  discussed. 2. Eat three meals per day and do not skip meals. 3. Take medications as prescribed. 4. Increase water intake to 5 bottles per day and avoid diet sodas.. 5. Avoid snacks, cakes, cookies, sweets, desserts and junk food. 6. Test blood sugars twice a day; in the morning and at night before bed and record on blood sugar log sheet. Bring to all appointments. 7. Get A1C down to 8% in three months. 8. Walk 30-45 minutes most days of the week.  Teaching Method Utilized:  Visual Auditory Hands on  Handouts given during visit include: The Plate Method Meal Plan Card  Barriers to learning/adherence to lifestyle change: none  Demonstrated degree of understanding via:  Teach Back   Monitoring/Evaluation:  Dietary intake, exercise, meal planning, SBG, and body weight in 1 month(s).

## 2014-11-22 ENCOUNTER — Encounter: Payer: Medicaid Other | Attending: "Endocrinology | Admitting: Nutrition

## 2014-11-22 VITALS — Ht 67.0 in | Wt 185.4 lb

## 2014-11-22 DIAGNOSIS — E118 Type 2 diabetes mellitus with unspecified complications: Secondary | ICD-10-CM | POA: Diagnosis present

## 2014-11-22 DIAGNOSIS — Z713 Dietary counseling and surveillance: Secondary | ICD-10-CM | POA: Diagnosis not present

## 2014-11-22 DIAGNOSIS — E782 Mixed hyperlipidemia: Secondary | ICD-10-CM

## 2014-11-22 DIAGNOSIS — E119 Type 2 diabetes mellitus without complications: Secondary | ICD-10-CM

## 2014-11-22 NOTE — Progress Notes (Signed)
  Medical Nutrition Therapy:  Appt start time: 6950 end time:  1600.  Assessment:  Primary concerns today: DIabetes Follow up . Most recent A1C 6.6%, down from 10.2%.. Still on Invokana and Metformin 1000 mg. He notes he is having trouble sleeping at night. Was given meds to help with sleeping medication but can't remember the name. Cut out late eating and snacks. Has eaten smaller portions. Has been exercising by walking most every day.   Doesn't have nicotine patch anymore. Has history of elevated liver enzymes and hypertriglyceridemia. No medications prescribed for those at present. BS have improved dramatically. HS blood sugars are in the low 100's.    Physical activity: Likes to fish and yard chores and has been doing some walking.  Trying to quit smoking. Diet is low in fresh fruits, vegetables and whole grains. Eating better food choices overall though.  Preferred Learning Style:   Auditory  Hands on   Learning Readiness:   Ready  Change in progress   MEDICATIONS: See list   DIETARY INTAKE:   24-hr recall:  Eating 3 meals per day. Has cut out night time eating and snacks. Feels better. Drinks water with meals.  Usual physical activity: none  Estimated energy needs: 1800 calories 200 g carbohydrates 135 g protein 50 g fat  Progress Towards Goal(s):  In progress.   Nutritional Diagnosis:  NB-1.1 Food and nutrition-related knowledge deficit As related to Diabetes.  As evidenced by A1C was 10.5%.    Intervention:  Nutrition counseling and nutrition education provided on diabetes disease, target ranges for blood sugars, signs/symptoms of hyper/hypoglycemia and treatment of both, My Plate, portions sizes, importance of exercise and prevention of complications of DM.  Plan:  Keep up the good work!! Eat three piece of fruit per day. Increase vegetables to 2 servings with meals.. Make sure you eat protein at breakfast. Aim for 3 Carb Choices per meal (45 grams)  Make  sure you eat three meals per day and DON"T SKIP meals. Goal : Keep A1C less than 7%. Quit smoking. Avoid alcohol.   Teaching Method Utilized:  Visual Auditory Hands on  Handouts given during visit include: The Plate Method Meal Plan Card  Barriers to learning/adherence to lifestyle change: none  Demonstrated degree of understanding via:  Teach Back   Monitoring/Evaluation:  Dietary intake, exercise, meal planning, SBG, and body weight in PRN.. May consider following up on elevated liver enzymes and triglycerides.

## 2014-11-22 NOTE — Patient Instructions (Addendum)
  Plan:  Keep up the good work!! Eat three piece of fruit per day. Increase vegetables to 2 servings with lunch and dinner daily.. Make sure you eat protein at breakfast. Aim for 3 Carb Choices per meal (45 grams)  Make sure you eat three meals per day and DON"T SKIP meals. Goal : Keep A1C less than 7%. Quit smoking. Avoid alcohol.

## 2015-01-14 ENCOUNTER — Emergency Department (HOSPITAL_COMMUNITY): Payer: Medicaid Other

## 2015-01-14 ENCOUNTER — Encounter (HOSPITAL_COMMUNITY): Payer: Self-pay | Admitting: *Deleted

## 2015-01-14 ENCOUNTER — Emergency Department (HOSPITAL_COMMUNITY)
Admission: EM | Admit: 2015-01-14 | Discharge: 2015-01-15 | Disposition: A | Discharge status: Home / Self Care | Payer: Medicaid Other | Attending: Emergency Medicine | Admitting: Emergency Medicine

## 2015-01-14 DIAGNOSIS — Y9389 Activity, other specified: Secondary | ICD-10-CM | POA: Insufficient documentation

## 2015-01-14 DIAGNOSIS — Z791 Long term (current) use of non-steroidal anti-inflammatories (NSAID): Secondary | ICD-10-CM | POA: Insufficient documentation

## 2015-01-14 DIAGNOSIS — S51012A Laceration without foreign body of left elbow, initial encounter: Secondary | ICD-10-CM | POA: Diagnosis not present

## 2015-01-14 DIAGNOSIS — R4781 Slurred speech: Secondary | ICD-10-CM | POA: Insufficient documentation

## 2015-01-14 DIAGNOSIS — Z720 Tobacco use: Secondary | ICD-10-CM | POA: Insufficient documentation

## 2015-01-14 DIAGNOSIS — E119 Type 2 diabetes mellitus without complications: Secondary | ICD-10-CM | POA: Insufficient documentation

## 2015-01-14 DIAGNOSIS — Z79899 Other long term (current) drug therapy: Secondary | ICD-10-CM | POA: Diagnosis not present

## 2015-01-14 DIAGNOSIS — S59902A Unspecified injury of left elbow, initial encounter: Secondary | ICD-10-CM | POA: Diagnosis present

## 2015-01-14 DIAGNOSIS — S8992XA Unspecified injury of left lower leg, initial encounter: Secondary | ICD-10-CM | POA: Insufficient documentation

## 2015-01-14 DIAGNOSIS — Y92410 Unspecified street and highway as the place of occurrence of the external cause: Secondary | ICD-10-CM | POA: Diagnosis not present

## 2015-01-14 DIAGNOSIS — F10920 Alcohol use, unspecified with intoxication, uncomplicated: Secondary | ICD-10-CM

## 2015-01-14 DIAGNOSIS — Y998 Other external cause status: Secondary | ICD-10-CM | POA: Insufficient documentation

## 2015-01-14 DIAGNOSIS — F10120 Alcohol abuse with intoxication, uncomplicated: Secondary | ICD-10-CM | POA: Insufficient documentation

## 2015-01-14 DIAGNOSIS — M1990 Unspecified osteoarthritis, unspecified site: Secondary | ICD-10-CM | POA: Insufficient documentation

## 2015-01-14 LAB — COMPREHENSIVE METABOLIC PANEL
ALBUMIN: 3.4 g/dL — AB (ref 3.5–5.0)
ALT: 53 U/L (ref 17–63)
ANION GAP: 11 (ref 5–15)
AST: 59 U/L — ABNORMAL HIGH (ref 15–41)
Alkaline Phosphatase: 65 U/L (ref 38–126)
BUN: 15 mg/dL (ref 6–20)
CO2: 22 mmol/L (ref 22–32)
Calcium: 8.1 mg/dL — ABNORMAL LOW (ref 8.9–10.3)
Chloride: 104 mmol/L (ref 101–111)
Creatinine, Ser: 0.96 mg/dL (ref 0.61–1.24)
GFR calc non Af Amer: >60 mL/min (ref >60)
Glucose, Bld: 128 mg/dL — ABNORMAL HIGH (ref 65–99)
Potassium: 3.4 mmol/L — ABNORMAL LOW (ref 3.5–5.1)
Sodium: 137 mmol/L (ref 135–145)
TOTAL PROTEIN: 7.2 g/dL (ref 6.5–8.1)
Total Bilirubin: 1.3 mg/dL — ABNORMAL HIGH (ref 0.3–1.2)

## 2015-01-14 LAB — CBC WITH DIFFERENTIAL/PLATELET
BASOS ABS: 0 10*3/uL (ref 0.0–0.1)
Basophils Relative: 0 % (ref 0–1)
EOS PCT: 2 % (ref 0–5)
Eosinophils Absolute: 0.2 10*3/uL (ref 0.0–0.7)
HCT: 41.2 % (ref 39.0–52.0)
Hemoglobin: 14.6 g/dL (ref 13.0–17.0)
LYMPHS PCT: 36 % (ref 12–46)
Lymphs Abs: 2.8 10*3/uL (ref 0.7–4.0)
MCH: 35.2 pg — ABNORMAL HIGH (ref 26.0–34.0)
MCHC: 35.4 g/dL (ref 30.0–36.0)
MCV: 99.3 fL (ref 78.0–100.0)
Monocytes Absolute: 0.7 10*3/uL (ref 0.1–1.0)
Monocytes Relative: 9 % (ref 3–12)
NEUTROS ABS: 4.1 10*3/uL (ref 1.7–7.7)
NEUTROS PCT: 54 % (ref 43–77)
Platelets: 89 10*3/uL — ABNORMAL LOW (ref 150–400)
RBC: 4.15 MIL/uL — AB (ref 4.22–5.81)
RDW: 13.3 % (ref 11.5–15.5)
WBC: 7.7 10*3/uL (ref 4.0–10.5)

## 2015-01-14 LAB — CBG MONITORING, ED: GLUCOSE-CAPILLARY: 147 mg/dL — AB (ref 65–99)

## 2015-01-14 LAB — ETHANOL: Alcohol, Ethyl (B): 120 mg/dL — ABNORMAL HIGH (ref <5)
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
## 2015-01-14 NOTE — ED Notes (Signed)
Pt up sitting on the side of the bed answering questions appropriately. Pt offered a urinal by Gabriel Cirri, NT. Pt has no complaints.
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
## 2015-01-15 MED ORDER — DOUBLE ANTIBIOTIC 500-10000 UNIT/GM EX OINT
TOPICAL_OINTMENT | Freq: Once | CUTANEOUS | Status: DC
  Filled 2015-01-15: qty 2
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
## 2015-01-15 NOTE — ED Notes (Signed)
Pt given meal tray and diet coke.

## 2015-01-15 NOTE — ED Notes (Signed)
Pt sleeping. 
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
## 2015-04-01 ENCOUNTER — Other Ambulatory Visit: Payer: Self-pay | Admitting: "Endocrinology

## 2015-05-05 LAB — HEMOGLOBIN A1C: HEMOGLOBIN A1C: 6 % (ref 4.0–6.0)

## 2015-05-13 ENCOUNTER — Encounter: Payer: Self-pay | Admitting: "Endocrinology

## 2015-05-13 ENCOUNTER — Ambulatory Visit (INDEPENDENT_AMBULATORY_CARE_PROVIDER_SITE_OTHER): Payer: Medicare Other | Admitting: "Endocrinology

## 2015-05-13 VITALS — BP 151/86 | HR 66 | Ht 62.0 in | Wt 181.0 lb

## 2015-05-13 DIAGNOSIS — E119 Type 2 diabetes mellitus without complications: Secondary | ICD-10-CM | POA: Diagnosis not present

## 2015-05-13 DIAGNOSIS — I10 Essential (primary) hypertension: Secondary | ICD-10-CM

## 2015-05-13 DIAGNOSIS — E1165 Type 2 diabetes mellitus with hyperglycemia: Secondary | ICD-10-CM | POA: Insufficient documentation

## 2015-05-13 DIAGNOSIS — Z720 Tobacco use: Secondary | ICD-10-CM

## 2015-05-13 DIAGNOSIS — F17200 Nicotine dependence, unspecified, uncomplicated: Secondary | ICD-10-CM

## 2015-05-13 MED ORDER — LISINOPRIL 10 MG PO TABS: 10.0000 mg | ORAL_TABLET | Freq: Every day | ORAL | Status: DC

## 2015-05-13 MED ORDER — CANAGLIFLOZIN 100 MG PO TABS: 100.0000 mg | ORAL_TABLET | Freq: Every day | ORAL | Status: DC

## 2015-05-13 MED ORDER — METFORMIN HCL 1000 MG PO TABS: 500.0000 mg | ORAL_TABLET | Freq: Two times a day (BID) | ORAL | Status: DC

## 2015-05-13 NOTE — Progress Notes (Signed)
Subjective:    Patient ID: Juan Juarez, male    DOB: [birth year],    Past Medical History  Diagnosis Date  . Diabetes mellitus without complication ([place])   . Arthritis   . Osteoarthritis   . Gout   . Anxiety   . Diabetes mellitus, type II ([place])    History reviewed. No pertinent past surgical history. Social History   Social History  . Marital Status: Divorced    Spouse Name: N/A  . Number of Children: N/A  . Years of Education: N/A   Social History Main Topics  . Smoking status: Current Every Day Smoker  . Smokeless tobacco: None  . Alcohol Use: Yes  . Drug Use: No  . Sexual Activity: Not Asked   Other Topics Concern  . None   Social History Narrative   Outpatient Encounter Prescriptions as of 05/13/2015  Medication Sig  . allopurinol (ZYLOPRIM) 100 MG tablet Take 2 tablets by mouth daily as needed. For gout flare  . ALPRAZolam (XANAX) 0.5 MG tablet Take 0.5 mg by mouth at bedtime.  . canagliflozin (INVOKANA) 100 MG TABS tablet Take 1 tablet (100 mg total) by mouth daily.  Marland Kitchen doxepin (SINEQUAN) 10 MG capsule Take 10 mg by mouth at bedtime.  Marland Kitchen lisinopril (PRINIVIL,ZESTRIL) 10 MG tablet Take 1 tablet (10 mg total) by mouth daily.  . meloxicam (MOBIC) 7.5 MG tablet Take 7.5 mg by mouth daily.  . metFORMIN (GLUCOPHAGE) 1000 MG tablet Take 0.5 tablets (500 mg total) by mouth 2 (two) times daily with a meal.  . sertraline (ZOLOFT) 100 MG tablet Take 100 mg by mouth daily.  . [DISCONTINUED] INVOKANA 100 MG TABS tablet TAKE ONE TABLET BY MOUTH DAILY.  . [DISCONTINUED] lisinopril (PRINIVIL,ZESTRIL) 2.5 MG tablet Take 2.5 mg by mouth daily.  . [DISCONTINUED] metFORMIN (GLUCOPHAGE) 1000 MG tablet TAKE ONE TABLET BY MOUTH TWICE DAILY.  . traMADol-acetaminophen (ULTRACET) 37.5-325 MG per tablet Take 1 tablet by mouth every 4 (four) hours as needed. (Patient not taking: Reported on 06/29/2014)  . [DISCONTINUED] TRADJENTA 5 MG TABS tablet Take 5 mg by mouth daily.   No  facility-administered encounter medications on file as of 05/13/2015.   ALLERGIES: No Known Allergies VACCINATION STATUS:  There is no immunization history on file for this patient.  Diabetes He presents for his follow-up diabetic visit. He has type 2 diabetes mellitus. Onset time: He was diagnosed at approximate age of 30 years. In this patient heavy use of alcohol or could be given in a theology factor for diabetes. His disease course has been improving. There are no hypoglycemic associated symptoms. Pertinent negatives for hypoglycemia include no confusion, headaches, pallor or seizures. There are no diabetic associated symptoms. Pertinent negatives for diabetes include no chest pain, no fatigue, no polydipsia, no polyphagia, no polyuria and no weakness. There are no hypoglycemic complications. Symptoms are improving. There are no diabetic complications. Risk factors for coronary artery disease include diabetes mellitus, dyslipidemia, male sex, hypertension, sedentary lifestyle and tobacco exposure. Current diabetic treatment includes oral agent (dual therapy). He is compliant with treatment most of the time. His weight is stable. He has had a previous visit with a dietitian. He rarely participates in exercise. An ACE inhibitor/angiotensin II receptor blocker is being taken.  Hypertension This is a chronic problem. The current episode started more than 1 year ago. Pertinent negatives include no chest pain, headaches, neck pain, palpitations or shortness of breath. Risk factors for coronary artery disease include diabetes mellitus,  male gender, sedentary lifestyle and smoking/tobacco exposure. Past treatments include ACE inhibitors.     Review of Systems  Constitutional: Negative for fatigue and unexpected weight change.  HENT: Negative for dental problem, mouth sores and trouble swallowing.   Eyes: Negative for visual disturbance.  Respiratory: Negative for cough, choking, chest tightness,  shortness of breath and wheezing.   Cardiovascular: Negative for chest pain, palpitations and leg swelling.  Gastrointestinal: Negative for nausea, vomiting, abdominal pain, diarrhea, constipation and abdominal distention.  Endocrine: Negative for polydipsia, polyphagia and polyuria.  Genitourinary: Negative for dysuria, urgency, hematuria and flank pain.  Musculoskeletal: Negative for myalgias, back pain, gait problem and neck pain.  Skin: Negative for pallor, rash and wound.  Neurological: Negative for seizures, syncope, weakness, numbness and headaches.  Psychiatric/Behavioral: Negative.  Negative for confusion and dysphoric mood.    Objective:    BP 151/86 mmHg  Pulse 66  Ht 5\' 2"  (1.575 m)  Wt 181 lb (82.101 kg)  BMI 33.10 kg/m2  SpO2 97%  Wt Readings from Last 3 Encounters:  05/13/15 181 lb (82.101 kg)  01/14/15 180 lb (81.647 kg)  11/22/14 185 lb 6.4 oz (84.097 kg)    Physical Exam  Constitutional: He is oriented to person, place, and time. He appears well-developed and well-nourished. He is cooperative. No distress.  HENT:  Head: Normocephalic and atraumatic.  Eyes: EOM are normal.  Neck: Normal range of motion. Neck supple. No tracheal deviation present. No thyromegaly present.  Cardiovascular: Normal rate, S1 normal, S2 normal and normal heart sounds.  Exam reveals no gallop.   No murmur heard. Pulses:      Dorsalis pedis pulses are 1+ on the right side, and 1+ on the left side.       Posterior tibial pulses are 1+ on the right side, and 1+ on the left side.  Pulmonary/Chest: Breath sounds normal. No respiratory distress. He has no wheezes.  Abdominal: Soft. Bowel sounds are normal. He exhibits no distension. There is no tenderness. There is no guarding and no CVA tenderness.  Musculoskeletal: He exhibits no edema.       Right shoulder: He exhibits no swelling and no deformity.  Neurological: He is alert and oriented to person, place, and time. He has normal strength  and normal reflexes. No cranial nerve deficit or sensory deficit. Gait normal.  Skin: Skin is warm and dry. No rash noted. No cyanosis. Nails show no clubbing.  Psychiatric: He has a normal mood and affect. His speech is normal and behavior is normal. Judgment and thought content normal. Cognition and memory are normal.    Results for orders placed or performed in visit on 05/13/15  Hemoglobin A1c  Result Value Ref Range   Hgb A1c MFr Bld 6.0 4.0 - 6.0 %   Complete Blood Count (Most recent): Lab Results  Component Value Date   WBC 7.7 01/14/2015   HGB 14.6 01/14/2015   HCT 41.2 01/14/2015   MCV 99.3 01/14/2015   PLT 89* 01/14/2015   Chemistry (most recent): Lab Results  Component Value Date   NA 137 01/14/2015   K 3.4* 01/14/2015   CL 104 01/14/2015   CO2 22 01/14/2015   BUN 15 01/14/2015   CREATININE 0.96 01/14/2015       Assessment & Plan:   1. Diabetes mellitus without complication ([place])  -His  diabetes is  complicated by heavy alcohol consumption and patient remains at a high risk for more acute and chronic complications of diabetes which  include CAD, CVA, CKD, retinopathy, and neuropathy. These are all discussed in detail with the patient.  Patient came with improved A1c of 6 %.   Recent labs reviewed.   - I have re-counseled the patient on diet management  by adopting a carbohydrate restricted / protein rich  Diet.  - Suggestion is made for patient to avoid simple carbohydrates   from their diet including Cakes , Desserts, Ice Cream,  Soda (  diet and regular) , Sweet Tea , Candies,  Chips, Cookies, Artificial Sweeteners,   and "Sugar-free" Products .  This will help patient to have stable blood glucose profile and potentially avoid unintended  Weight gain.  - Patient is advised to stick to a routine mealtimes to eat 3 meals  a day and avoid unnecessary snacks ( to snack only to correct hypoglycemia).  - The patient  has been  scheduled with Jearld Fenton, RDN,  CDE for individualized DM education.  - I have approached patient with the following individualized plan to manage diabetes and patient agrees.  -I will continue metformin 500 mg by mouth twice a day and Invokana 100 mg by mouth every morning. Side effects and precautions discussed in detail with patient. He is advised to maintain adequate hydration.  - He is not a good candidate for incretin therapy , due to his consumption of heavy alcohol with elevated liver enzymes. -He may need evaluation by gastroenterology if he continues to have elevated transaminases. - Patient specific target  for A1c; LDL, HDL, Triglycerides, and  Waist Circumference were discussed in detail.  2) BP/HTN: Uncontrolled. I would increase his lisinopril to 10 mg by mouth daily.  3) Lipids/HPL:  I will avoid statins for now. 4)  Weight/Diet: CDE consult in progress, exercise, and carbohydrates information provided.  5) smoking: This is as a significant health problem for him coupled with significant alcohol consumption on a regular basis. I counseled him for 15 minutes to consider smoking cessation and wean himself off of alcohol given his persistently elevated transaminases. 6) Chronic Care/Health Maintenance:  -Patient is  on ACEI medications and encouraged to continue to follow up with Ophthalmology, Podiatrist at least yearly or according to recommendations, and advised to quit smoking. I have recommended yearly flu vaccine and pneumonia vaccination at least every 5 years; moderate intensity exercise for up to 150 minutes weekly; and  sleep for at least 7 hours a day.  I advised patient to maintain close follow up with their PCP for primary care needs.  Patient is asked to bring meter and  blood glucose logs during their next visit.   Follow up plan: Return in about 3 months (around 08/13/2015) for diabetes, high blood pressure.  Glade Lloyd, MD Phone: [phone]  Fax: [phone]   05/13/2015, 4:03 PM

## 2015-05-13 NOTE — Patient Instructions (Signed)

## 2015-06-29 ENCOUNTER — Other Ambulatory Visit (HOSPITAL_COMMUNITY): Payer: Self-pay | Admitting: Internal Medicine

## 2015-06-29 DIAGNOSIS — R945 Abnormal results of liver function studies: Secondary | ICD-10-CM

## 2015-06-29 DIAGNOSIS — R7989 Other specified abnormal findings of blood chemistry: Secondary | ICD-10-CM

## 2015-07-01 ENCOUNTER — Other Ambulatory Visit (HOSPITAL_COMMUNITY): Payer: Medicaid Other

## 2015-07-06 ENCOUNTER — Ambulatory Visit (HOSPITAL_COMMUNITY): Admission: RE | Admit: 2015-07-06 | Payer: Medicare Other | Source: Ambulatory Visit

## 2015-08-08 ENCOUNTER — Other Ambulatory Visit: Payer: Self-pay | Admitting: "Endocrinology

## 2015-08-08 LAB — BASIC METABOLIC PANEL
BUN: 17 mg/dL (ref 7–25)
CALCIUM: 8.9 mg/dL (ref 8.6–10.3)
CHLORIDE: 101 mmol/L (ref 98–110)
CO2: 25 mmol/L (ref 20–31)
Creat: 0.97 mg/dL (ref 0.70–1.25)
GLUCOSE: 211 mg/dL — AB (ref 65–99)
Potassium: 4 mmol/L (ref 3.5–5.3)
Sodium: 136 mmol/L (ref 135–146)

## 2015-08-08 LAB — HEPATIC FUNCTION PANEL
ALBUMIN: 3.7 g/dL (ref 3.6–5.1)
ALT: 119 U/L — ABNORMAL HIGH (ref 9–46)
AST: 95 U/L — AB (ref 10–35)
Alkaline Phosphatase: 94 U/L (ref 40–115)
BILIRUBIN DIRECT: 0.6 mg/dL — AB (ref <=0.2)
BILIRUBIN TOTAL: 1.3 mg/dL — AB (ref 0.2–1.2)
Indirect Bilirubin: 0.7 mg/dL (ref 0.2–1.2)
Total Protein: 7.6 g/dL (ref 6.1–8.1)

## 2015-08-08 LAB — HEMOGLOBIN A1C
Hgb A1c MFr Bld: 7 % — ABNORMAL HIGH (ref <5.7)
Mean Plasma Glucose: 154 mg/dL — ABNORMAL HIGH (ref <117)

## 2015-08-15 ENCOUNTER — Encounter: Payer: Self-pay | Admitting: "Endocrinology

## 2015-08-15 ENCOUNTER — Ambulatory Visit (INDEPENDENT_AMBULATORY_CARE_PROVIDER_SITE_OTHER): Payer: Medicare Other | Admitting: "Endocrinology

## 2015-08-15 VITALS — BP 120/84 | HR 80 | Ht 62.0 in | Wt 183.0 lb

## 2015-08-15 DIAGNOSIS — I10 Essential (primary) hypertension: Secondary | ICD-10-CM | POA: Diagnosis not present

## 2015-08-15 DIAGNOSIS — E119 Type 2 diabetes mellitus without complications: Secondary | ICD-10-CM

## 2015-08-15 NOTE — Progress Notes (Signed)
Subjective:    Patient ID: Juan Juarez, male    DOB: 1956/01/11,    Past Medical History  Diagnosis Date  . Diabetes mellitus without complication ([place])   . Arthritis   . Osteoarthritis   . Gout   . Anxiety   . Diabetes mellitus, type II ([place])    No past surgical history on file. Social History   Social History  . Marital Status: Divorced    Spouse Name: N/A  . Number of Children: N/A  . Years of Education: N/A   Social History Main Topics  . Smoking status: Current Every Day Smoker  . Smokeless tobacco: None  . Alcohol Use: Yes  . Drug Use: No  . Sexual Activity: Not Asked   Other Topics Concern  . None   Social History Narrative   Outpatient Encounter Prescriptions as of 08/15/2015  Medication Sig  . allopurinol (ZYLOPRIM) 100 MG tablet Take 2 tablets by mouth daily as needed. For gout flare  . ALPRAZolam (XANAX) 0.5 MG tablet Take 0.5 mg by mouth at bedtime.  . canagliflozin (INVOKANA) 100 MG TABS tablet Take 1 tablet (100 mg total) by mouth daily.  Marland Kitchen doxepin (SINEQUAN) 10 MG capsule Take 10 mg by mouth at bedtime.  Marland Kitchen lisinopril (PRINIVIL,ZESTRIL) 10 MG tablet Take 1 tablet (10 mg total) by mouth daily.  . meloxicam (MOBIC) 7.5 MG tablet Take 7.5 mg by mouth daily.  . metFORMIN (GLUCOPHAGE) 1000 MG tablet Take 0.5 tablets (500 mg total) by mouth 2 (two) times daily with a meal.  . sertraline (ZOLOFT) 100 MG tablet Take 100 mg by mouth daily.  . traMADol-acetaminophen (ULTRACET) 37.5-325 MG per tablet Take 1 tablet by mouth every 4 (four) hours as needed. (Patient not taking: Reported on 06/29/2014)   No facility-administered encounter medications on file as of 08/15/2015.   ALLERGIES: No Known Allergies VACCINATION STATUS:  There is no immunization history on file for this patient.  Diabetes He presents for his follow-up diabetic visit. He has type 2 diabetes mellitus. Onset time: He was diagnosed at approximate age of 55 years. In this patient heavy  use of alcohol or could be given in a theology factor for diabetes. His disease course has been worsening. There are no hypoglycemic associated symptoms. Pertinent negatives for hypoglycemia include no confusion, headaches, pallor or seizures. There are no diabetic associated symptoms. Pertinent negatives for diabetes include no chest pain, no fatigue, no polydipsia, no polyphagia, no polyuria and no weakness. There are no hypoglycemic complications. Symptoms are stable. There are no diabetic complications. Risk factors for coronary artery disease include diabetes mellitus, dyslipidemia, male sex, hypertension, sedentary lifestyle and tobacco exposure. Current diabetic treatment includes oral agent (dual therapy). He is compliant with treatment most of the time. His weight is increasing steadily. He has had a previous visit with a dietitian. He rarely participates in exercise. An ACE inhibitor/angiotensin II receptor blocker is being taken.  Hypertension This is a chronic problem. The current episode started more than 1 year ago. Pertinent negatives include no chest pain, headaches, neck pain, palpitations or shortness of breath. Risk factors for coronary artery disease include diabetes mellitus, male gender, sedentary lifestyle and smoking/tobacco exposure. Past treatments include ACE inhibitors.     Review of Systems  Constitutional: Negative for fatigue and unexpected weight change.  HENT: Negative for dental problem, mouth sores and trouble swallowing.   Eyes: Negative for visual disturbance.  Respiratory: Negative for cough, choking, chest tightness, shortness of breath  and wheezing.   Cardiovascular: Negative for chest pain, palpitations and leg swelling.  Gastrointestinal: Negative for nausea, vomiting, abdominal pain, diarrhea, constipation and abdominal distention.  Endocrine: Negative for polydipsia, polyphagia and polyuria.  Genitourinary: Negative for dysuria, urgency, hematuria and flank  pain.  Musculoskeletal: Negative for myalgias, back pain, gait problem and neck pain.  Skin: Negative for pallor, rash and wound.  Neurological: Negative for seizures, syncope, weakness, numbness and headaches.  Psychiatric/Behavioral: Negative.  Negative for confusion and dysphoric mood.    Objective:    BP 120/84 mmHg  Pulse 80  Ht 5\' 2"  (1.575 m)  Wt 183 lb (83.008 kg)  BMI 33.46 kg/m2  SpO2 96%  Wt Readings from Last 3 Encounters:  08/15/15 183 lb (83.008 kg)  05/13/15 181 lb (82.101 kg)  01/14/15 180 lb (81.647 kg)    Physical Exam  Constitutional: He is oriented to person, place, and time. He appears well-developed and well-nourished. He is cooperative. No distress.  HENT:  Head: Normocephalic and atraumatic.  Eyes: EOM are normal.  Neck: Normal range of motion. Neck supple. No tracheal deviation present. No thyromegaly present.  Cardiovascular: Normal rate, S1 normal, S2 normal and normal heart sounds.  Exam reveals no gallop.   No murmur heard. Pulses:      Dorsalis pedis pulses are 1+ on the right side, and 1+ on the left side.       Posterior tibial pulses are 1+ on the right side, and 1+ on the left side.  Pulmonary/Chest: Breath sounds normal. No respiratory distress. He has no wheezes.  Abdominal: Soft. Bowel sounds are normal. He exhibits no distension. There is no tenderness. There is no guarding and no CVA tenderness.  Musculoskeletal: He exhibits no edema.       Right shoulder: He exhibits no swelling and no deformity.  Neurological: He is alert and oriented to person, place, and time. He has normal strength and normal reflexes. No cranial nerve deficit or sensory deficit. Gait normal.  Skin: Skin is warm and dry. No rash noted. No cyanosis. Nails show no clubbing.  Psychiatric: He has a normal mood and affect. His speech is normal and behavior is normal. Judgment and thought content normal. Cognition and memory are normal.    Results for orders placed or  performed in visit on [id number]  Basic metabolic panel  Result Value Ref Range   Sodium 136 135 - 146 mmol/L   Potassium 4.0 3.5 - 5.3 mmol/L   Chloride 101 98 - 110 mmol/L   CO2 25 20 - 31 mmol/L   Glucose, Bld 211 (H) 65 - 99 mg/dL   BUN 17 7 - 25 mg/dL   Creat 0.97 0.70 - 1.25 mg/dL   Calcium 8.9 8.6 - 10.3 mg/dL  Hemoglobin A1c  Result Value Ref Range   Hgb A1c MFr Bld 7.0 (H) <5.7 %   Mean Plasma Glucose 154 (H) <117 mg/dL    Assessment & Plan:   1. Diabetes mellitus without complication ([place])  -His  diabetes is  complicated by heavy alcohol consumption and patient remains at a high risk for more acute and chronic complications of diabetes which include CAD, CVA, CKD, retinopathy, and neuropathy. These are all discussed in detail with the patient.  Patient came with higher A1c of 7% from 6 %.   Recent labs reviewed.   - I have re-counseled the patient on diet management  by adopting a carbohydrate restricted / protein rich  Diet.  - Suggestion is  made for patient to avoid simple carbohydrates   from their diet including Cakes , Desserts, Ice Cream,  Soda (  diet and regular) , Sweet Tea , Candies,  Chips, Cookies, Artificial Sweeteners,   and "Sugar-free" Products .  This will help patient to have stable blood glucose profile and potentially avoid unintended  Weight gain.  - Patient is advised to stick to a routine mealtimes to eat 3 meals  a day and avoid unnecessary snacks ( to snack only to correct hypoglycemia).  - The patient  has been  scheduled with Jearld Fenton, RDN, CDE for individualized DM education.  - I have approached patient with the following individualized plan to manage diabetes and patient agrees.  -I will continue metformin 500 mg by mouth twice a day and Invokana 100 mg by mouth every morning. Side effects and precautions discussed in detail with patient. He is advised to maintain adequate hydration.  - He is not a good candidate for incretin therapy ,  due to his consumption of heavy alcohol with elevated liver enzymes. -He may need evaluation by gastroenterology if he continues to have elevated transaminases. - Patient specific target  for A1c; LDL, HDL, Triglycerides, and  Waist Circumference were discussed in detail.  2) BP/HTN: Uncontrolled. I would increase his lisinopril to 10 mg by mouth daily.  3) Lipids/HPL:  I will avoid statins for now. I will obtain fasting lipid panel along with his next blood work.  4)  Weight/Diet: CDE consult in progress, exercise, and carbohydrates information provided.  5) smoking: This is as a significant health problem for him coupled with significant alcohol consumption on a regular basis. I counseled him for 15 minutes to consider smoking cessation and wean himself off of alcohol given his persistently elevated transaminases.  6) Chronic Care/Health Maintenance:  -Patient is  on ACEI medications and encouraged to continue to follow up with Ophthalmology, Podiatrist at least yearly or according to recommendations, and advised to quit smoking. I have recommended yearly flu vaccine and pneumonia vaccination at least every 5 years; moderate intensity exercise for up to 150 minutes weekly; and  sleep for at least 7 hours a day.  I advised patient to maintain close follow up with their PCP for primary care needs.  Patient is asked to bring meter and  blood glucose logs during their next visit.   Follow up plan: Return in about 3 months (around 11/12/2015) for diabetes, high blood pressure, follow up with pre-visit labs.  Glade Lloyd, MD Phone: [phone]  Fax: [phone]   08/15/2015, 10:50 AM

## 2015-08-15 NOTE — Patient Instructions (Signed)

## 2015-08-21 ENCOUNTER — Emergency Department (HOSPITAL_COMMUNITY)
Admission: EM | Admit: 2015-08-21 | Discharge: 2015-08-21 | Disposition: A | Discharge status: Home / Self Care | Payer: Medicare Other | Attending: Emergency Medicine | Admitting: Emergency Medicine

## 2015-08-21 ENCOUNTER — Encounter (HOSPITAL_COMMUNITY): Payer: Self-pay | Admitting: Emergency Medicine

## 2015-08-21 ENCOUNTER — Emergency Department (HOSPITAL_COMMUNITY): Payer: Medicare Other

## 2015-08-21 DIAGNOSIS — W268XXA Contact with other sharp object(s), not elsewhere classified, initial encounter: Secondary | ICD-10-CM | POA: Insufficient documentation

## 2015-08-21 DIAGNOSIS — Y93H2 Activity, gardening and landscaping: Secondary | ICD-10-CM | POA: Diagnosis not present

## 2015-08-21 DIAGNOSIS — Z79899 Other long term (current) drug therapy: Secondary | ICD-10-CM | POA: Insufficient documentation

## 2015-08-21 DIAGNOSIS — S60943A Unspecified superficial injury of left middle finger, initial encounter: Secondary | ICD-10-CM | POA: Diagnosis present

## 2015-08-21 DIAGNOSIS — Y92007 Garden or yard of unspecified non-institutional (private) residence as the place of occurrence of the external cause: Secondary | ICD-10-CM | POA: Insufficient documentation

## 2015-08-21 DIAGNOSIS — S60922A Unspecified superficial injury of left hand, initial encounter: Secondary | ICD-10-CM

## 2015-08-21 DIAGNOSIS — L089 Local infection of the skin and subcutaneous tissue, unspecified: Secondary | ICD-10-CM

## 2015-08-21 DIAGNOSIS — Y998 Other external cause status: Secondary | ICD-10-CM | POA: Insufficient documentation

## 2015-08-21 DIAGNOSIS — F17210 Nicotine dependence, cigarettes, uncomplicated: Secondary | ICD-10-CM | POA: Diagnosis not present

## 2015-08-21 DIAGNOSIS — F419 Anxiety disorder, unspecified: Secondary | ICD-10-CM | POA: Insufficient documentation

## 2015-08-21 DIAGNOSIS — S61203A Unspecified open wound of left middle finger without damage to nail, initial encounter: Secondary | ICD-10-CM | POA: Diagnosis not present

## 2015-08-21 DIAGNOSIS — E119 Type 2 diabetes mellitus without complications: Secondary | ICD-10-CM | POA: Diagnosis not present

## 2015-08-21 LAB — CBG MONITORING, ED: Glucose-Capillary: 154 mg/dL — ABNORMAL HIGH (ref 65–99)

## 2015-08-21 MED ORDER — CLINDAMYCIN HCL 150 MG PO CAPS
300.0000 mg | ORAL_CAPSULE | Freq: Once | ORAL | Status: AC
  Administered 2015-08-21: 300 mg via ORAL
  Filled 2015-08-21: qty 2

## 2015-08-21 MED ORDER — CLINDAMYCIN HCL 150 MG PO CAPS: 150.0000 mg | ORAL_CAPSULE | Freq: Four times a day (QID) | ORAL | Status: DC

## 2015-08-21 MED ORDER — HYDROCODONE-ACETAMINOPHEN 5-325 MG PO TABS: 1.0000 | ORAL_TABLET | ORAL | Status: DC | PRN

## 2015-08-21 MED ORDER — HYDROCODONE-ACETAMINOPHEN 5-325 MG PO TABS
1.0000 | ORAL_TABLET | Freq: Once | ORAL | Status: AC
  Administered 2015-08-21: 1 via ORAL
  Filled 2015-08-21: qty 1

## 2015-08-21 NOTE — ED Provider Notes (Signed)
CSN: [id number]     Arrival date & time 08/21/15  1359 History  By signing my name below, I, Juan Juarez, attest that this documentation has been prepared under the direction and in the presence of Juan Jefferson, PA-C. Electronically Signed: Hansel Juarez, ED Scribe. 08/21/2015. 3:20 PM.     Chief Complaint  Patient presents with  . Wound Infection   The history is provided by the patient. No language interpreter was used.    HPI Comments: Juan Juarez is a 60 y.o. male with h/o DM who presents to the Emergency Department complaining of a gradually worsening area of pain, swelling, and redness to the left middle finger pad onset 6 days ago after getting a splinter. Pt notes that he got a wooden splinter in his finger 6 days ago while working in the garden from a [employer] and believes he removed the entire splinter. He notes that he has attempted to drain the area himself with no relief. Pt states he has taken 2 ibuprofen with no relief of pain. Tdap UTD. PCP Dr. Legrand Rams. NKDA. He denies fever, drainage.   Past Medical History  Diagnosis Date  . Diabetes mellitus without complication ([place])   . Arthritis   . Osteoarthritis   . Gout   . Anxiety   . Diabetes mellitus, type II ([place])    History reviewed. No pertinent past surgical history. Family History  Problem Relation Age of Onset  . Thyroid disease Sister    Social History  Substance Use Topics  . Smoking status: Current Every Day Smoker -- 0.50 packs/day for 50 years    Types: Cigarettes  . Smokeless tobacco: Never Used  . Alcohol Use: Yes     Comment: occasional    Review of Systems  Constitutional: Negative for fever.  Skin: Positive for color change and wound.   Allergies  Review of patient's allergies indicates no known allergies.  Home Medications   Prior to Admission medications   Medication Sig Start Date End Date Taking? Authorizing Provider  allopurinol (ZYLOPRIM) 100 MG tablet Take 2 tablets by mouth daily  as needed. For gout flare 04/09/14   Historical Provider, MD  ALPRAZolam Duanne Moron) 0.5 MG tablet Take 0.5 mg by mouth at bedtime.    Historical Provider, MD  canagliflozin (INVOKANA) 100 MG TABS tablet Take 1 tablet (100 mg total) by mouth daily. 05/13/15   Cassandria Anger, MD  clindamycin (CLEOCIN) 150 MG capsule Take 1 capsule (150 mg total) by mouth every 6 (six) hours. 08/21/15   Juan Jefferson, PA-C  doxepin (SINEQUAN) 10 MG capsule Take 10 mg by mouth at bedtime.    Historical Provider, MD  HYDROcodone-acetaminophen (NORCO/VICODIN) 5-325 MG tablet Take 1 tablet by mouth every 4 (four) hours as needed. 08/21/15   Juan Jefferson, PA-C  lisinopril (PRINIVIL,ZESTRIL) 10 MG tablet Take 1 tablet (10 mg total) by mouth daily. 05/13/15   Cassandria Anger, MD  meloxicam (MOBIC) 7.5 MG tablet Take 7.5 mg by mouth daily.    Historical Provider, MD  metFORMIN (GLUCOPHAGE) 1000 MG tablet Take 0.5 tablets (500 mg total) by mouth 2 (two) times daily with a meal. 05/13/15   Cassandria Anger, MD  sertraline (ZOLOFT) 100 MG tablet Take 100 mg by mouth daily.    Historical Provider, MD  traMADol-acetaminophen (ULTRACET) 37.5-325 MG per tablet Take 1 tablet by mouth every 4 (four) hours as needed. Patient not taking: Reported on 06/29/2014 12/28/13   Carole Civil, MD  BP 128/80 mmHg  Pulse 74  Temp(Src) 98.1 F (36.7 C) (Oral)  Resp 18  Ht 5\' 7"  (1.702 m)  Wt 83.008 kg  BMI 28.66 kg/m2  SpO2 98% Physical Exam  Constitutional: He is oriented to person, place, and time. He appears well-developed and well-nourished.  HENT:  Head: Normocephalic and atraumatic.  Neck: Normal range of motion. Neck supple.  Cardiovascular: Normal rate.   Pulmonary/Chest: Effort normal. No respiratory distress.  Musculoskeletal: Normal range of motion.  Erythema and induration of the left long finger medial surface with a scab at his DIP joint. No red streaking. Finger is tender with less than 2 sec. Distal cap refill.  There is no purulent discharge and no fluctuance. Pt can flex and extend the finger but limited secondary to swelling, does not increase pain.  Neurological: He is alert and oriented to person, place, and time.  Skin: Skin is warm and dry. There is erythema.  Psychiatric: He has a normal mood and affect. His behavior is normal.  Nursing note and vitals reviewed.   ED Course  Procedures (including critical care time) DIAGNOSTIC STUDIES: Oxygen Saturation is 98% on RA, normal by my interpretation.    COORDINATION OF CARE: 3:13 PM Discussed treatment plan with pt at bedside which includes bedside US, antibiotics and pt agreed to plan.  Informal bedside US performed, no obvious fluctuant pocket and no recognized fb.   Labs Review Labs Reviewed  CBG MONITORING, ED - Abnormal; Notable for the following:    Glucose-Capillary 154 (*)    All other components within normal limits    Imaging Review Dg Finger Middle Left  08/21/2015  CLINICAL DATA:  Left middle finger swollen, painful and red, pt stated he had a splinter in finger on Monday 2-13, pt tried to remove splinter on 2/14 EXAM: LEFT MIDDLE FINGER 2+V COMPARISON:  None. FINDINGS: No fracture. Joints are normally spaced and aligned. No arthropathic change. No bone resorption is seen to suggest osteomyelitis. There is diffuse soft tissue swelling. No soft tissue air. No radiopaque foreign body. IMPRESSION: 1. No fracture or dislocation.  No evidence of osteomyelitis. 2. No evidence of a radiopaque foreign body. Electronically Signed   By: Lajean Manes M.D.   On: 08/21/2015 15:33   I have personally reviewed and evaluated these images and lab results as part of my medical decision-making.  MDM   Final diagnoses:  Finger infection  Injury, superficial, hand with infection, left, initial encounter    Discussed with Dr. Lacinda Axon who also saw patient.  Clindamycin, close f/u with hand specialist.  No indication for need of I and D today as  there is no apparent pus pocket today, no sign of active tenosynovitis, but advised pt this infection can get worse quickly and he will need close f/u care.  Discussed with Dr. Lenon Curt, ok with clindamycin.  Pt to call his office for a recheck if this infection tomorrow.   I personally performed the services described in this documentation, which was scribed in my presence. The recorded information has been reviewed and is accurate.   Juan Jefferson, PA-C 08/21/15 1711  Nat Christen, MD 08/21/15 2153

## 2015-08-21 NOTE — ED Notes (Signed)
Patient has swelling, redness and warmth to left middle finger. Per patient got splinter in finger Monday and tried to remove it with fingernail clippers. Patient unsure of last tetanus vaccine. Patient states he is diabetic. Denies any fevers or drainage.

## 2015-08-21 NOTE — Discharge Instructions (Signed)
Fingertip Infection When an infection is around the nail, it is called a paronychia. When it appears over the tip of the finger, it is called a felon. These infections are due to minor injuries or cracks in the skin. If they are not treated properly, they can lead to bone infection and permanent damage to the fingernail. Incision and drainage is necessary if a pus pocket (an abscess) has formed. Antibiotics and pain medicine may also be needed. Keep your hand elevated for the next 2-3 days to reduce swelling and pain. If a pack was placed in the abscess, it should be removed in 1-2 days by your caregiver. Soak the finger in warm water for 20 minutes 4 times daily to help promote drainage. Keep the hands as dry as possible. Wear protective gloves with cotton liners. See your caregiver for follow-up care as recommended.  HOME CARE INSTRUCTIONS   Keep wound clean, dry and dressed as suggested by your caregiver.  Soak in warm salt water for fifteen minutes, four times per day for bacterial infections.  Your caregiver will prescribe an antibiotic if a bacterial infection is suspected. Take antibiotics as directed and finish the prescription, even if the problem appears to be improving before the medicine is gone.  Only take over-the-counter or prescription medicines for pain, discomfort, or fever as directed by your caregiver. SEEK IMMEDIATE MEDICAL CARE IF:  There is redness, swelling, or increasing pain in the wound.  Pus or any other unusual drainage is coming from the wound.  An unexplained oral temperature above 102 F (38.9 C) develops.  You notice a foul smell coming from the wound or dressing. MAKE SURE YOU:   Understand these instructions.  Monitor your condition.  Contact your caregiver if you are getting worse or not improving.   This information is not intended to replace advice given to you by your health care provider. Make sure you discuss any questions you have with your  health care provider.   Document Released: 07/26/2004 Document Revised: 09/10/2011 Document Reviewed: 12/06/2014 Elsevier Interactive Patient Education 2016 [employer]  

## 2015-09-14 ENCOUNTER — Emergency Department (HOSPITAL_COMMUNITY)
Admission: EM | Admit: 2015-09-14 | Discharge: 2015-09-14 | Disposition: A | Discharge status: Home / Self Care | Payer: Medicare Other | Attending: Emergency Medicine | Admitting: Emergency Medicine

## 2015-09-14 ENCOUNTER — Emergency Department (HOSPITAL_COMMUNITY): Payer: Medicare Other

## 2015-09-14 ENCOUNTER — Encounter (HOSPITAL_COMMUNITY): Payer: Self-pay

## 2015-09-14 DIAGNOSIS — S01112A Laceration without foreign body of left eyelid and periocular area, initial encounter: Secondary | ICD-10-CM | POA: Insufficient documentation

## 2015-09-14 DIAGNOSIS — Y939 Activity, unspecified: Secondary | ICD-10-CM | POA: Diagnosis not present

## 2015-09-14 DIAGNOSIS — Z23 Encounter for immunization: Secondary | ICD-10-CM | POA: Insufficient documentation

## 2015-09-14 DIAGNOSIS — W132XXA Fall from, out of or through roof, initial encounter: Secondary | ICD-10-CM | POA: Diagnosis not present

## 2015-09-14 DIAGNOSIS — M25552 Pain in left hip: Secondary | ICD-10-CM | POA: Diagnosis not present

## 2015-09-14 DIAGNOSIS — F17210 Nicotine dependence, cigarettes, uncomplicated: Secondary | ICD-10-CM | POA: Diagnosis not present

## 2015-09-14 DIAGNOSIS — M1990 Unspecified osteoarthritis, unspecified site: Secondary | ICD-10-CM | POA: Insufficient documentation

## 2015-09-14 DIAGNOSIS — Y999 Unspecified external cause status: Secondary | ICD-10-CM | POA: Insufficient documentation

## 2015-09-14 DIAGNOSIS — E119 Type 2 diabetes mellitus without complications: Secondary | ICD-10-CM | POA: Insufficient documentation

## 2015-09-14 DIAGNOSIS — W19XXXA Unspecified fall, initial encounter: Secondary | ICD-10-CM

## 2015-09-14 DIAGNOSIS — Y929 Unspecified place or not applicable: Secondary | ICD-10-CM | POA: Diagnosis not present

## 2015-09-14 DIAGNOSIS — S0993XA Unspecified injury of face, initial encounter: Secondary | ICD-10-CM | POA: Diagnosis present

## 2015-09-14 DIAGNOSIS — Z7984 Long term (current) use of oral hypoglycemic drugs: Secondary | ICD-10-CM | POA: Insufficient documentation

## 2015-09-14 LAB — CBC WITH DIFFERENTIAL/PLATELET
Basophils Absolute: 0 10*3/uL (ref 0.0–0.1)
Basophils Relative: 1 %
Eosinophils Absolute: 0.1 10*3/uL (ref 0.0–0.7)
Eosinophils Relative: 2 %
HEMATOCRIT: 46.7 % (ref 39.0–52.0)
HEMOGLOBIN: 16.4 g/dL (ref 13.0–17.0)
LYMPHS ABS: 1.9 10*3/uL (ref 0.7–4.0)
Lymphocytes Relative: 30 %
MCH: 35.3 pg — AB (ref 26.0–34.0)
MCHC: 35.1 g/dL (ref 30.0–36.0)
MCV: 100.4 fL — AB (ref 78.0–100.0)
MONOS PCT: 11 %
Monocytes Absolute: 0.7 10*3/uL (ref 0.1–1.0)
NEUTROS ABS: 3.5 10*3/uL (ref 1.7–7.7)
NEUTROS PCT: 57 %
Platelets: 85 10*3/uL — ABNORMAL LOW (ref 150–400)
RBC: 4.65 MIL/uL (ref 4.22–5.81)
RDW: 13.6 % (ref 11.5–15.5)
SMEAR REVIEW: DECREASED
WBC: 6.2 10*3/uL (ref 4.0–10.5)

## 2015-09-14 LAB — BASIC METABOLIC PANEL
ANION GAP: 9 (ref 5–15)
BUN: 19 mg/dL (ref 6–20)
CHLORIDE: 106 mmol/L (ref 101–111)
CO2: 22 mmol/L (ref 22–32)
Calcium: 8.6 mg/dL — ABNORMAL LOW (ref 8.9–10.3)
Creatinine, Ser: 1.27 mg/dL — ABNORMAL HIGH (ref 0.61–1.24)
GFR calc non Af Amer: 59 mL/min — ABNORMAL LOW (ref >60)
Glucose, Bld: 270 mg/dL — ABNORMAL HIGH (ref 65–99)
Potassium: 4.2 mmol/L (ref 3.5–5.1)
Sodium: 137 mmol/L (ref 135–145)

## 2015-09-14 LAB — ETHANOL: ALCOHOL ETHYL (B): 190 mg/dL — AB (ref <5)

## 2015-09-14 LAB — CBG MONITORING, ED: GLUCOSE-CAPILLARY: 250 mg/dL — AB (ref 65–99)

## 2015-09-14 MED ORDER — TETANUS-DIPHTH-ACELL PERTUSSIS 5-2.5-18.5 LF-MCG/0.5 IM SUSP
0.5000 mL | Freq: Once | INTRAMUSCULAR | Status: AC
  Administered 2015-09-14: 0.5 mL via INTRAMUSCULAR
  Filled 2015-09-14: qty 0.5

## 2015-09-14 MED ORDER — SODIUM CHLORIDE 0.9 % IV BOLUS (SEPSIS)
500.0000 mL | Freq: Once | INTRAVENOUS | Status: AC
  Administered 2015-09-14: 500 mL via INTRAVENOUS

## 2015-09-14 MED ORDER — IOHEXOL 300 MG/ML  SOLN
100.0000 mL | Freq: Once | INTRAMUSCULAR | Status: AC | PRN
  Administered 2015-09-14: 100 mL via INTRAVENOUS

## 2015-09-14 MED ORDER — HYDROGEN PEROXIDE 3 % EX SOLN
CUTANEOUS | Status: AC
  Filled 2015-09-14: qty 473

## 2015-09-14 MED ORDER — LIDOCAINE-EPINEPHRINE (PF) 2 %-1:200000 IJ SOLN
10.0000 mL | Freq: Once | INTRAMUSCULAR | Status: DC
  Filled 2015-09-14: qty 20

## 2015-09-14 NOTE — ED Notes (Signed)
Pt refusing sutures by Lily Kocher. Pt made aware of risks of not getting area sutured. Pt agreed with this and he knew the risks.

## 2015-09-14 NOTE — ED Notes (Signed)
CT tech called to report pt being uncooperative during attempt to perform CT scans/xrays. Serita Kyle, RN in route to CT to assist.
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
## 2015-09-14 NOTE — ED Notes (Signed)
Pt given meal tray.

## 2015-09-14 NOTE — ED Notes (Signed)
Pt is attempting to call for a ride.

## 2015-09-14 NOTE — Discharge Instructions (Signed)
Follow up with dr. Legrand Rams next week.   Clean forehead laceration twice a day with soap and water
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
## 2015-09-14 NOTE — ED Notes (Signed)
Pt states he feels like his sugar is dropping.
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
## 2015-11-10 ENCOUNTER — Other Ambulatory Visit: Payer: Self-pay | Admitting: "Endocrinology

## 2015-11-11 LAB — LIPID PANEL
Cholesterol: 138 mg/dL (ref 125–200)
HDL: 38 mg/dL — ABNORMAL LOW (ref >=40)
LDL CALC: 69 mg/dL (ref <130)
TRIGLYCERIDES: 156 mg/dL — AB (ref <150)
Total CHOL/HDL Ratio: 3.6 Ratio (ref <=5.0)
VLDL: 31 mg/dL — AB (ref <30)

## 2015-11-11 LAB — HEMOGLOBIN A1C
HEMOGLOBIN A1C: 7.2 % — AB (ref <5.7)
MEAN PLASMA GLUCOSE: 160 mg/dL

## 2015-11-11 LAB — BASIC METABOLIC PANEL
BUN: 17 mg/dL (ref 7–25)
CALCIUM: 8.9 mg/dL (ref 8.6–10.3)
CO2: 27 mmol/L (ref 20–31)
CREATININE: 0.92 mg/dL (ref 0.70–1.25)
Chloride: 101 mmol/L (ref 98–110)
Glucose, Bld: 147 mg/dL — ABNORMAL HIGH (ref 65–99)
Potassium: 3.9 mmol/L (ref 3.5–5.3)
SODIUM: 135 mmol/L (ref 135–146)

## 2015-11-11 LAB — MICROALBUMIN / CREATININE URINE RATIO
CREATININE, URINE: 109 mg/dL (ref 20–370)
Microalb Creat Ratio: 31 mcg/mg creat — ABNORMAL HIGH (ref <30)
Microalb, Ur: 3.4 mg/dL

## 2015-11-15 ENCOUNTER — Ambulatory Visit: Payer: Medicare Other | Admitting: "Endocrinology

## 2015-12-01 ENCOUNTER — Ambulatory Visit: Payer: Medicare Other | Admitting: "Endocrinology

## 2016-01-23 ENCOUNTER — Other Ambulatory Visit: Payer: Self-pay | Admitting: "Endocrinology

## 2016-06-04 ENCOUNTER — Other Ambulatory Visit: Payer: Self-pay | Admitting: "Endocrinology

## 2016-08-24 DIAGNOSIS — F331 Major depressive disorder, recurrent, moderate: Secondary | ICD-10-CM | POA: Diagnosis not present

## 2016-08-24 DIAGNOSIS — F411 Generalized anxiety disorder: Secondary | ICD-10-CM | POA: Diagnosis not present

## 2016-09-03 ENCOUNTER — Other Ambulatory Visit: Payer: Self-pay

## 2016-09-03 ENCOUNTER — Other Ambulatory Visit: Payer: Self-pay | Admitting: "Endocrinology

## 2016-09-03 ENCOUNTER — Telehealth: Payer: Self-pay | Admitting: "Endocrinology

## 2016-09-03 DIAGNOSIS — E118 Type 2 diabetes mellitus with unspecified complications: Principal | ICD-10-CM

## 2016-09-03 DIAGNOSIS — E1165 Type 2 diabetes mellitus with hyperglycemia: Secondary | ICD-10-CM | POA: Diagnosis not present

## 2016-09-03 NOTE — Telephone Encounter (Signed)
Patient is requesting a refill on his Invokana and also states he needs his diabetic testing supplies. His meter no longer works so he needs new meter too.

## 2016-09-04 NOTE — Telephone Encounter (Signed)
Pts last visit was 08-15-15. No labs done yet. Has appt next week. He is requesting refills on Invokana and also a meter. Can we refill?

## 2016-09-04 NOTE — Telephone Encounter (Signed)
No refills until we see his labs and visit.

## 2016-09-04 NOTE — Telephone Encounter (Signed)
Pt.notified

## 2016-09-05 ENCOUNTER — Other Ambulatory Visit: Payer: Self-pay | Admitting: "Endocrinology

## 2016-09-06 ENCOUNTER — Telehealth: Payer: Self-pay

## 2016-09-06 ENCOUNTER — Ambulatory Visit: Payer: Medicare Other | Admitting: "Endocrinology

## 2016-09-06 LAB — HEMOGLOBIN A1C
HEMOGLOBIN A1C: 12.3 % — AB (ref <5.7)
Mean Plasma Glucose: 306 mg/dL

## 2016-09-06 LAB — MICROALBUMIN / CREATININE URINE RATIO: CREATININE, URINE: 23 mg/dL (ref 20–370)

## 2016-09-06 LAB — BASIC METABOLIC PANEL
BUN: 21 mg/dL (ref 7–25)
CO2: 25 mmol/L (ref 20–31)
CREATININE: 1.15 mg/dL (ref 0.70–1.25)
Calcium: 8.8 mg/dL (ref 8.6–10.3)
Chloride: 95 mmol/L — ABNORMAL LOW (ref 98–110)
Glucose, Bld: 703 mg/dL (ref 65–99)
POTASSIUM: 5.4 mmol/L — AB (ref 3.5–5.3)
Sodium: 128 mmol/L — ABNORMAL LOW (ref 135–146)

## 2016-09-06 NOTE — Telephone Encounter (Signed)
[place] police dept went out for a wellness check. They called back and state that the pt is refusing to go to the ER but they are continuing to convince him that he does need to go.

## 2016-09-06 NOTE — Telephone Encounter (Signed)
Left message for pt to call us back concerning critical high glucose of 705. Will attempt to have pt come in today.

## 2016-09-11 ENCOUNTER — Ambulatory Visit: Payer: Medicare Other | Admitting: "Endocrinology

## 2016-09-13 ENCOUNTER — Encounter (HOSPITAL_COMMUNITY): Payer: Self-pay | Admitting: Emergency Medicine

## 2016-09-13 ENCOUNTER — Observation Stay (HOSPITAL_COMMUNITY)
Admission: EM | Admit: 2016-09-13 | Discharge: 2016-09-14 | Disposition: A | Discharge status: Home / Self Care | Payer: Medicare Other | Attending: Internal Medicine | Admitting: Internal Medicine

## 2016-09-13 DIAGNOSIS — Z9114 Patient's other noncompliance with medication regimen: Secondary | ICD-10-CM | POA: Insufficient documentation

## 2016-09-13 DIAGNOSIS — F331 Major depressive disorder, recurrent, moderate: Secondary | ICD-10-CM

## 2016-09-13 DIAGNOSIS — F1490 Cocaine use, unspecified, uncomplicated: Secondary | ICD-10-CM | POA: Insufficient documentation

## 2016-09-13 DIAGNOSIS — R7989 Other specified abnormal findings of blood chemistry: Secondary | ICD-10-CM | POA: Diagnosis present

## 2016-09-13 DIAGNOSIS — E43 Unspecified severe protein-calorie malnutrition: Secondary | ICD-10-CM | POA: Diagnosis present

## 2016-09-13 DIAGNOSIS — Z7984 Long term (current) use of oral hypoglycemic drugs: Secondary | ICD-10-CM | POA: Insufficient documentation

## 2016-09-13 DIAGNOSIS — F329 Major depressive disorder, single episode, unspecified: Secondary | ICD-10-CM | POA: Diagnosis present

## 2016-09-13 DIAGNOSIS — Z79899 Other long term (current) drug therapy: Secondary | ICD-10-CM | POA: Insufficient documentation

## 2016-09-13 DIAGNOSIS — D696 Thrombocytopenia, unspecified: Secondary | ICD-10-CM | POA: Diagnosis present

## 2016-09-13 DIAGNOSIS — F17210 Nicotine dependence, cigarettes, uncomplicated: Secondary | ICD-10-CM | POA: Insufficient documentation

## 2016-09-13 DIAGNOSIS — R945 Abnormal results of liver function studies: Secondary | ICD-10-CM

## 2016-09-13 DIAGNOSIS — F17200 Nicotine dependence, unspecified, uncomplicated: Secondary | ICD-10-CM | POA: Diagnosis present

## 2016-09-13 DIAGNOSIS — R739 Hyperglycemia, unspecified: Secondary | ICD-10-CM

## 2016-09-13 DIAGNOSIS — F1290 Cannabis use, unspecified, uncomplicated: Secondary | ICD-10-CM | POA: Diagnosis not present

## 2016-09-13 DIAGNOSIS — F1910 Other psychoactive substance abuse, uncomplicated: Secondary | ICD-10-CM | POA: Diagnosis present

## 2016-09-13 DIAGNOSIS — I10 Essential (primary) hypertension: Secondary | ICD-10-CM | POA: Diagnosis not present

## 2016-09-13 DIAGNOSIS — F32A Depression, unspecified: Secondary | ICD-10-CM | POA: Diagnosis present

## 2016-09-13 DIAGNOSIS — E1165 Type 2 diabetes mellitus with hyperglycemia: Secondary | ICD-10-CM | POA: Diagnosis not present

## 2016-09-13 HISTORY — DX: Insomnia, unspecified: G47.00

## 2016-09-13 HISTORY — DX: Major depressive disorder, single episode, unspecified: F32.9

## 2016-09-13 HISTORY — DX: Depression, unspecified: F32.A

## 2016-09-13 HISTORY — DX: Unspecified severe protein-calorie malnutrition: E43

## 2016-09-13 LAB — CBC WITH DIFFERENTIAL/PLATELET
BASOS ABS: 0 10*3/uL (ref 0.0–0.1)
BASOS PCT: 1 %
EOS ABS: 0.1 10*3/uL (ref 0.0–0.7)
Eosinophils Relative: 1 %
HCT: 40.2 % (ref 39.0–52.0)
Hemoglobin: 14.5 g/dL (ref 13.0–17.0)
Lymphocytes Relative: 26 %
Lymphs Abs: 1.6 10*3/uL (ref 0.7–4.0)
MCH: 34.9 pg — ABNORMAL HIGH (ref 26.0–34.0)
MCHC: 36.1 g/dL — ABNORMAL HIGH (ref 30.0–36.0)
MCV: 96.9 fL (ref 78.0–100.0)
MONO ABS: 0.5 10*3/uL (ref 0.1–1.0)
Monocytes Relative: 8 %
NEUTROS ABS: 4 10*3/uL (ref 1.7–7.7)
NEUTROS PCT: 64 %
Platelets: 68 10*3/uL — ABNORMAL LOW (ref 150–400)
RBC: 4.15 MIL/uL — ABNORMAL LOW (ref 4.22–5.81)
RDW: 12.9 % (ref 11.5–15.5)
WBC: 6.2 10*3/uL (ref 4.0–10.5)

## 2016-09-13 LAB — RAPID URINE DRUG SCREEN, HOSP PERFORMED
Amphetamines: NOT DETECTED
BARBITURATES: NOT DETECTED
Benzodiazepines: POSITIVE — AB
Cocaine: POSITIVE — AB
Opiates: NOT DETECTED
Tetrahydrocannabinol: NOT DETECTED

## 2016-09-13 LAB — GLUCOSE, CAPILLARY: Glucose-Capillary: 552 mg/dL (ref 65–99)

## 2016-09-13 LAB — URINALYSIS, ROUTINE W REFLEX MICROSCOPIC
BILIRUBIN URINE: NEGATIVE
Bacteria, UA: NONE SEEN
Hgb urine dipstick: NEGATIVE
KETONES UR: NEGATIVE mg/dL
LEUKOCYTES UA: NEGATIVE
NITRITE: NEGATIVE
Protein, ur: NEGATIVE mg/dL
Specific Gravity, Urine: 1.03 (ref 1.005–1.030)
pH: 6 (ref 5.0–8.0)

## 2016-09-13 LAB — I-STAT CHEM 8, ED
BUN: 22 mg/dL — ABNORMAL HIGH (ref 6–20)
CREATININE: 1 mg/dL (ref 0.61–1.24)
Calcium, Ion: 1.17 mmol/L (ref 1.15–1.40)
Chloride: 97 mmol/L — ABNORMAL LOW (ref 101–111)
Glucose, Bld: 438 mg/dL — ABNORMAL HIGH (ref 65–99)
HEMATOCRIT: 41 % (ref 39.0–52.0)
HEMOGLOBIN: 13.9 g/dL (ref 13.0–17.0)
POTASSIUM: 3.9 mmol/L (ref 3.5–5.1)
SODIUM: 133 mmol/L — AB (ref 135–145)
TCO2: 26 mmol/L (ref 0–100)

## 2016-09-13 LAB — COMPREHENSIVE METABOLIC PANEL
ALT: 86 U/L — ABNORMAL HIGH (ref 17–63)
ANION GAP: 9 (ref 5–15)
AST: 72 U/L — ABNORMAL HIGH (ref 15–41)
Albumin: 2.7 g/dL — ABNORMAL LOW (ref 3.5–5.0)
Alkaline Phosphatase: 105 U/L (ref 38–126)
BILIRUBIN TOTAL: 1.1 mg/dL (ref 0.3–1.2)
BUN: 20 mg/dL (ref 6–20)
CALCIUM: 8.8 mg/dL — AB (ref 8.9–10.3)
CO2: 25 mmol/L (ref 22–32)
CREATININE: 1.02 mg/dL (ref 0.61–1.24)
Chloride: 96 mmol/L — ABNORMAL LOW (ref 101–111)
GFR calc non Af Amer: >60 mL/min (ref >60)
GLUCOSE: 425 mg/dL — AB (ref 65–99)
Potassium: 3.7 mmol/L (ref 3.5–5.1)
Sodium: 130 mmol/L — ABNORMAL LOW (ref 135–145)
TOTAL PROTEIN: 6.7 g/dL (ref 6.5–8.1)

## 2016-09-13 LAB — CBG MONITORING, ED: Glucose-Capillary: 335 mg/dL — ABNORMAL HIGH (ref 65–99)

## 2016-09-13 LAB — ETHANOL

## 2016-09-13 LAB — GLUCOSE, RANDOM: Glucose, Bld: 490 mg/dL — ABNORMAL HIGH (ref 65–99)

## 2016-09-13 MED ORDER — INSULIN GLARGINE 100 UNIT/ML ~~LOC~~ SOLN
10.0000 [IU] | Freq: Every day | SUBCUTANEOUS | Status: DC
  Administered 2016-09-13: 10 [IU] via SUBCUTANEOUS
  Filled 2016-09-13 (×4): qty 0.1

## 2016-09-13 MED ORDER — SODIUM CHLORIDE 0.9 % IV BOLUS (SEPSIS)
2000.0000 mL | Freq: Once | INTRAVENOUS | Status: AC
  Administered 2016-09-13: 2000 mL via INTRAVENOUS

## 2016-09-13 MED ORDER — LACTATED RINGERS IV SOLN
INTRAVENOUS | Status: DC
  Administered 2016-09-13 – 2016-09-14 (×2): via INTRAVENOUS

## 2016-09-13 MED ORDER — LISINOPRIL 10 MG PO TABS
10.0000 mg | ORAL_TABLET | Freq: Every day | ORAL | Status: DC
  Administered 2016-09-14: 10 mg via ORAL
  Filled 2016-09-13 (×2): qty 1

## 2016-09-13 MED ORDER — CHLORHEXIDINE GLUCONATE 4 % EX LIQD
Freq: Once | CUTANEOUS | Status: AC
  Administered 2016-09-13: 23:00:00 via TOPICAL

## 2016-09-13 MED ORDER — DOXEPIN HCL 10 MG PO CAPS
10.0000 mg | ORAL_CAPSULE | Freq: Every day | ORAL | Status: DC
  Filled 2016-09-13 (×4): qty 1

## 2016-09-13 MED ORDER — ENSURE ENLIVE PO LIQD
237.0000 mL | Freq: Two times a day (BID) | ORAL | Status: DC
  Administered 2016-09-14: 237 mL via ORAL

## 2016-09-13 MED ORDER — ONDANSETRON HCL 4 MG/2ML IJ SOLN: 4.0000 mg | Freq: Four times a day (QID) | INTRAMUSCULAR | Status: DC | PRN

## 2016-09-13 MED ORDER — INSULIN ASPART 100 UNIT/ML ~~LOC~~ SOLN
5.0000 [IU] | Freq: Once | SUBCUTANEOUS | Status: AC
  Administered 2016-09-13: 5 [IU] via SUBCUTANEOUS

## 2016-09-13 MED ORDER — DOXEPIN HCL 10 MG PO CAPS
ORAL_CAPSULE | ORAL | Status: AC
  Filled 2016-09-13: qty 1

## 2016-09-13 MED ORDER — ACETAMINOPHEN 325 MG PO TABS
650.0000 mg | ORAL_TABLET | Freq: Four times a day (QID) | ORAL | Status: DC | PRN
  Administered 2016-09-14: 650 mg via ORAL
  Filled 2016-09-13: qty 2

## 2016-09-13 MED ORDER — ALPRAZOLAM 0.5 MG PO TABS
0.5000 mg | ORAL_TABLET | Freq: Every day | ORAL | Status: DC | PRN
  Administered 2016-09-13: 0.5 mg via ORAL
  Filled 2016-09-13: qty 1

## 2016-09-13 MED ORDER — ENOXAPARIN SODIUM 40 MG/0.4ML ~~LOC~~ SOLN: 40.0000 mg | SUBCUTANEOUS | Status: DC

## 2016-09-13 MED ORDER — NICOTINE 21 MG/24HR TD PT24
21.0000 mg | MEDICATED_PATCH | Freq: Every day | TRANSDERMAL | Status: DC
  Administered 2016-09-13 – 2016-09-14 (×2): 21 mg via TRANSDERMAL
  Filled 2016-09-13 (×2): qty 1

## 2016-09-13 MED ORDER — INSULIN ASPART 100 UNIT/ML ~~LOC~~ SOLN
0.0000 [IU] | Freq: Three times a day (TID) | SUBCUTANEOUS | Status: DC
  Administered 2016-09-14: 8 [IU] via SUBCUTANEOUS

## 2016-09-13 MED ORDER — ACETAMINOPHEN 650 MG RE SUPP
650.0000 mg | Freq: Four times a day (QID) | RECTAL | Status: DC | PRN
Start: 2016-09-13 — End: 2016-09-14

## 2016-09-13 MED ORDER — ONDANSETRON HCL 4 MG PO TABS: 4.0000 mg | ORAL_TABLET | Freq: Four times a day (QID) | ORAL | Status: DC | PRN

## 2016-09-13 NOTE — H&P (Signed)
History and Physical    Juan Juarez PPJ:[id number] DOB: 1956/01/17 DOA: 09/13/2016  PCP: Rosita Fire, MD Consultants:  Dorris Fetch - endocrinology; psychiatry - Janus Molder Patient coming from: home - lives alone; NOK: Peri Maris, [phone]  Chief Complaint: hyperglycemia  HPI: Juan Juarez is a 61 y.o. male with medical history significant of poorly controlled DM due to non-compliance, polysubstance abuse, insomnia, depression/anxiety presenting because his blood sugar is high.  He has been sleeping for about the last week - or longer, maybe even a month. Sleeps, gets up to the bathroom, eats, goes back to bed.  Same routine daily.  +depressed.  No SI.  Hasn't been taking Invokana - ran out and can't get anymore.  Hasn't been able to see either doctor in a year.  He reports taking Metformin - but doesn't keep track of time so not sure how many doses a week he misses.  40 pounds weight loss in the last 6 months, unintentional.  Rarely checks sugars, thinks his machine doesn't work. Has sores coming up on legs, buttocks, arms, everywhere.  Sores are painful.  No chest pain, abdominal pain.   ED Course:  IVF boluses for hyperglycemia and low BP, with improvement in both.  A1c 3/5 was 12.3 "which places him at high risk for DKA, concerned about pt's lack of compliance with meds and ofc appts, requests to admit pt to hospital for control of his CBG's and Social Work consult."    Review of Systems: As per HPI; otherwise 10 point review of systems reviewed and negative.   Ambulatory Status:  ambulates without assistance  Past Medical History:  Diagnosis Date  . Anxiety   . Arthritis   . Depression   . Diabetes mellitus, type II ([place])   . Gout   . Insomnia   . Osteoarthritis     History reviewed. No pertinent surgical history.  Social History   Social History  . Marital status: Divorced    Spouse name: N/A  . Number of children: N/A  . Years of education: N/A   Occupational  History  . disabled    Social History Main Topics  . Smoking status: Current Every Day Smoker    Packs/day: 0.50    Years: 50.00    Types: Cigarettes  . Smokeless tobacco: Never Used  . Alcohol use Yes     Comment: quit 08/2016 - "as much as I can get"  . Drug use: Yes    Types: Marijuana, Cocaine  . Sexual activity: Not on file   Other Topics Concern  . Not on file   Social History Narrative  . No narrative on file    No Known Allergies  Family History  Problem Relation Age of Onset  . Thyroid disease Sister     Prior to Admission medications   Medication Sig Start Date End Date Taking? Authorizing Provider  allopurinol (ZYLOPRIM) 100 MG tablet Take 2 tablets by mouth daily as needed. For gout flare 04/09/14  Yes Historical Provider, MD  alprazolam Duanne Moron) 2 MG tablet Take 0.5 tablets by mouth daily as needed. 09/05/16  Yes Historical Provider, MD  metFORMIN (GLUCOPHAGE) 1000 MG tablet Take 0.5 tablets (500 mg total) by mouth 2 (two) times daily with a meal. 05/13/15  Yes Cassandria Anger, MD  canagliflozin (INVOKANA) 100 MG TABS tablet Take 1 tablet (100 mg total) by mouth daily. Patient not taking: Reported on 09/13/2016 05/13/15   Cassandria Anger, MD  clindamycin (CLEOCIN) 150 MG capsule  Take 1 capsule (150 mg total) by mouth every 6 (six) hours. Patient not taking: Reported on 09/13/2016 08/21/15   [name], PA-C  doxepin (SINEQUAN) 10 MG capsule Take 10 mg by mouth at bedtime.    Historical Provider, MD  HYDROcodone-acetaminophen (NORCO/VICODIN) 5-325 MG tablet Take 1 tablet by mouth every 4 (four) hours as needed. Patient not taking: Reported on 09/13/2016 08/21/15   [name], PA-C  lisinopril (PRINIVIL,ZESTRIL) 10 MG tablet Take 1 tablet (10 mg total) by mouth daily. Patient not taking: Reported on 09/13/2016 05/13/15   Cassandria Anger, MD  traMADol-acetaminophen (ULTRACET) 37.5-325 MG per tablet Take 1 tablet by mouth every 4 (four) hours as  needed. Patient not taking: Reported on 06/29/2014 12/28/13   Carole Civil, MD    Physical Exam: Vitals:   09/13/16 1500 09/13/16 1530 09/13/16 1600 09/13/16 1630  BP: 121/78 119/74 127/86 122/82  Pulse: 69 68 69 70  Resp: 17 (!) 21 17 16   Temp:      TempSrc:      SpO2: 96% 98% 96% 97%  Weight:      Height:         General:  Appears calm and comfortable and is NAD, disheveled Eyes:  PERRL, EOMI, normal lids, iris ENT:  grossly normal hearing, lips & tongue, mmm Neck:  no LAD, masses or thyromegaly Cardiovascular:  RRR, no m/r/g. No LE edema.  Respiratory:  CTA bilaterally, no w/r/r. Normal respiratory effort. Abdomen:  soft, ntnd, NABS Skin:  no rash or induration seen on limited exam Musculoskeletal:  grossly normal tone BUE/BLE, good ROM, no bony abnormality Psychiatric:  grossly normal mood and affect, speech fluent and appropriate, AOx3 Neurologic:  CN 2-12 grossly intact, moves all extremities in coordinated fashion, sensation intact  Labs on Admission: I have personally reviewed following labs and imaging studies  CBC:  Recent Labs Lab 09/13/16 1441 09/13/16 1453  WBC 6.2  --   NEUTROABS 4.0  --   HGB 14.5 13.9  HCT 40.2 41.0  MCV 96.9  --   PLT 68*  --    Basic Metabolic Panel:  Recent Labs Lab 09/13/16 1441 09/13/16 1453  NA 130* 133*  K 3.7 3.9  CL 96* 97*  CO2 25  --   GLUCOSE 425* 438*  BUN 20 22*  CREATININE 1.02 1.00  CALCIUM 8.8*  --    GFR: Estimated Creatinine Clearance: 71.6 mL/min (by C-G formula based on SCr of 1 mg/dL). Liver Function Tests:  Recent Labs Lab 09/13/16 1441  AST 72*  ALT 86*  ALKPHOS 105  BILITOT 1.1  PROT 6.7  ALBUMIN 2.7*   No results for input(s): LIPASE, AMYLASE in the last 168 hours. No results for input(s): AMMONIA in the last 168 hours. Coagulation Profile: No results for input(s): INR, PROTIME in the last 168 hours. Cardiac Enzymes: No results for input(s): CKTOTAL, CKMB, CKMBINDEX,  TROPONINI in the last 168 hours. BNP (last 3 results) No results for input(s): PROBNP in the last 8760 hours. HbA1C: No results for input(s): HGBA1C in the last 72 hours. CBG:  Recent Labs Lab 09/13/16 1626  GLUCAP 335*   Lipid Profile: No results for input(s): CHOL, HDL, LDLCALC, TRIG, CHOLHDL, LDLDIRECT in the last 72 hours. Thyroid Function Tests: No results for input(s): TSH, T4TOTAL, FREET4, T3FREE, THYROIDAB in the last 72 hours. Anemia Panel: No results for input(s): VITAMINB12, FOLATE, FERRITIN, TIBC, IRON, RETICCTPCT in the last 72 hours. Urine analysis:    Component Value Date/Time  COLORURINE YELLOW 09/13/2016 1421   APPEARANCEUR CLEAR 09/13/2016 1421   LABSPEC 1.030 09/13/2016 1421   PHURINE 6.0 09/13/2016 1421   GLUCOSEU >=500 (A) 09/13/2016 1421   HGBUR NEGATIVE 09/13/2016 1421   BILIRUBINUR NEGATIVE 09/13/2016 1421   KETONESUR NEGATIVE 09/13/2016 1421   PROTEINUR NEGATIVE 09/13/2016 1421   UROBILINOGEN 0.2 07/01/2014 2244   NITRITE NEGATIVE 09/13/2016 1421   LEUKOCYTESUR NEGATIVE 09/13/2016 1421    Creatinine Clearance: Estimated Creatinine Clearance: 71.6 mL/min (by C-G formula based on SCr of 1 mg/dL).  Sepsis Labs: @LABRCNTIP (procalcitonin:4,lacticidven:4) )No results found for this or any previous visit (from the past 240 hour(s)).   Radiological Exams on Admission: No results found.  EKG: Independently reviewed.  NSR with rate 67; no evidence of acute ischemia  Assessment/Plan Principal Problem:   Diabetes mellitus with hyperglycemia (HCC) Active Problems:   Smoker   Polysubstance abuse   Elevated LFTs   Protein-calorie malnutrition, severe (HCC)   Depression   Thrombocytopenia (HCC)   Diabetes with hyperglycemia -Patient with long-standing DM -Glucose 425, 335; Hgb A1c 12.3 on 09/03/16 -UA: >500 glucose -He acknowledges poor compliance - has not seen either his PCP or Dr. Dorris Fetch in >1 year; cannot check sugars because his machine isn't  working; lost Medicaid (let it lapse?); and hasn't been able to get his Invokana. -As a result of all of the above, his prior reasonable DM control has severely declined. -He is not currently in DKA. -Dr. Dorris Fetch requested admission for diabetes control and for SW consult. -He is likely to need insulin given his marked A1c and hyperglycemia; will start Lantus at 10 units qhs. -Will also cover with SSI. -Since he does not have any critically abnormal labs, he is likely appropriate for close outpatient f/u after discharge tomorrow.  Malnutrition -Albumin 2.7 -Nutrition consult  Elevated LFTs/Thrombocytopenia -AST 72/ALT 86; 95/119 on 08/08/15 -Platelets 68 -Suspect that this is the result of long-standing alcohol dependence -Patient is not currently drinking because he remains on probation  Polysubstance abuse -UDS: BZD and cocaine positive -I have reviewed this patient in the [place] Controlled Substances Reporting System.  He is receiving Xanax monthly from only one provider and appears to be taking them as prescribed. -Obviously, the cocaine is illicit.  He acknowledges use and reports that the marijuana stays in his system too long and so he can't use it due to risk of detection by his PO, but the cocaine gets out of his system faster and so he continues to occasionally use it.  Tobacco dependence -Encourage cessation.  This was discussed with the patient and should be reviewed on an ongoing basis.   -Patch ordered at patient request.  Depression -He does appear to be depressed on my evaluation today. -He denies SI. -Will continue Doxepin and Xanax. -He would likely benefit from additional medication for mood control -He has outpatient psychiatry support; would suggest short-term follow-up  DVT prophylaxis:  SCDs Code Status:  DNR - confirmed with patient Family Communication: None present Disposition Plan:  Home once clinically improved Consults called: None  Admission status: It is  my clinical opinion that referral for OBSERVATION is reasonable and necessary in this patient based on the above information provided. The aforementioned taken together are felt to place the patient at high risk for further clinical deterioration. However it is anticipated that the patient may be medically stable for discharge from the hospital within 24 to 48 hours.    Karmen Bongo MD [facility]  If 7PM-7AM, please contact  night-coverage [email or web address] Password [email or web address]  09/13/2016, 8:25 PM

## 2016-09-13 NOTE — ED Triage Notes (Signed)
Pt reports his blood sugar has been high for several months now.  Has not taken his Invokana or seen his doctor in over a year.  Pt reports he does not take his Invokana because of commercials about law suits on TV.  Pt reports feeling tired and weak.

## 2016-09-13 NOTE — Progress Notes (Signed)
Pharmacy recommended pt receive alternate VTE other than Lovenox d/t thrombocytopenia.  Dr. Lorin Mercy notified. Herma Carson, RN notified during shift change report.

## 2016-09-13 NOTE — ED Provider Notes (Signed)
[place] DEPT Provider Note   CSN: [id number] Arrival date & time: 09/13/16  1349     History   Chief Complaint Chief Complaint  Patient presents with  . Hyperglycemia    HPI Juan Juarez is a 61 y.o. male.  HPI  Pt was seen at 1425. Per pt and his family, c/o gradual onset and persistence of constant "high blood sugars" for the past 3+ months. Pt states he has not been taking his Invokana for the past 3 months. Pt has not seen his Endocrinologist for the past year. Pt states he "has been calling" his Endocrinologist's office, and was sent for blood work. Per family: Endocrinologist office, as well as family, have been trying to get in touch with pt due to abnormal labs, "glucose over 700." Pt apparently has not been answering his phone or door. Police were sent to the house to do wellness check at request of Endocrinologist's office. Pt's sister and mother both convinced pt to come to ED for evaluation and admission. Pt's family states he has been "just laying around in bed" for the past 4 days. Pt only c/o feeling "tired" and generally weak. Denies CP/palpitations, no SOB/cough, no abd pain, no N/V/D, no back pain, no focal motor weakness, no tingling/numbness in extremities, no fevers.    Past Medical History:  Diagnosis Date  . Anxiety   . Arthritis   . Diabetes mellitus without complication ([place])   . Diabetes mellitus, type II ([place])   . Gout   . Osteoarthritis     Patient Active Problem List   Diagnosis Date Noted  . Diabetes mellitus without complication ([place]) 43/15/4008  . Essential hypertension, benign 05/13/2015  . Smoker 05/13/2015  . Back pain at L4-L5 level 11/05/2013  . Neck pain 11/05/2013  . Cervical spondylosis 11/05/2013  . Spinal stenosis of lumbar region 11/05/2013    History reviewed. No pertinent surgical history.     Home Medications    Prior to Admission medications   Medication Sig Start Date End Date Taking? Authorizing Provider    allopurinol (ZYLOPRIM) 100 MG tablet Take 2 tablets by mouth daily as needed. For gout flare 04/09/14  Yes Historical Provider, MD  alprazolam Duanne Moron) 2 MG tablet Take 0.5 tablets by mouth daily as needed. 09/05/16  Yes Historical Provider, MD  metFORMIN (GLUCOPHAGE) 1000 MG tablet Take 0.5 tablets (500 mg total) by mouth 2 (two) times daily with a meal. 05/13/15  Yes Cassandria Anger, MD  canagliflozin (INVOKANA) 100 MG TABS tablet Take 1 tablet (100 mg total) by mouth daily. Patient not taking: Reported on 09/13/2016 05/13/15   Cassandria Anger, MD  clindamycin (CLEOCIN) 150 MG capsule Take 1 capsule (150 mg total) by mouth every 6 (six) hours. Patient not taking: Reported on 09/13/2016 08/21/15   [name], PA-C  doxepin (SINEQUAN) 10 MG capsule Take 10 mg by mouth at bedtime.    Historical Provider, MD  HYDROcodone-acetaminophen (NORCO/VICODIN) 5-325 MG tablet Take 1 tablet by mouth every 4 (four) hours as needed. Patient not taking: Reported on 09/13/2016 08/21/15   [name], PA-C  lisinopril (PRINIVIL,ZESTRIL) 10 MG tablet Take 1 tablet (10 mg total) by mouth daily. Patient not taking: Reported on 09/13/2016 05/13/15   Cassandria Anger, MD  traMADol-acetaminophen (ULTRACET) 37.5-325 MG per tablet Take 1 tablet by mouth every 4 (four) hours as needed. Patient not taking: Reported on 06/29/2014 12/28/13   Carole Civil, MD    Family History Family History  Problem  Relation Age of Onset  . Thyroid disease Sister     Social History Social History  Substance Use Topics  . Smoking status: Current Every Day Smoker    Packs/day: 0.50    Years: 50.00    Types: Cigarettes  . Smokeless tobacco: Never Used  . Alcohol use Yes     Comment: quit 08/2016     Allergies   Patient has no known allergies.   Review of Systems Review of Systems ROS: Statement: All systems negative except as marked or noted in the HPI; Constitutional: Negative for fever and chills. +generalized  fatigue. ; ; Eyes: Negative for eye pain, redness and discharge. ; ; ENMT: Negative for ear pain, hoarseness, nasal congestion, sinus pressure and sore throat. ; ; Cardiovascular: Negative for chest pain, palpitations, diaphoresis, dyspnea and peripheral edema. ; ; Respiratory: Negative for cough, wheezing and stridor. ; ; Gastrointestinal: Negative for nausea, vomiting, diarrhea, abdominal pain, blood in stool, hematemesis, jaundice and rectal bleeding. . ; ; Genitourinary: Negative for dysuria, flank pain and hematuria. ; ; Musculoskeletal: Negative for back pain and neck pain. Negative for swelling and trauma.; ; Skin: Negative for pruritus, rash, abrasions, blisters, bruising and skin lesion.; ; Neuro: Negative for headache, lightheadedness and neck stiffness. Negative for altered level of consciousness, altered mental status, extremity weakness, paresthesias, involuntary movement, seizure and syncope.       Physical Exam Updated Vital Signs BP 108/70 (BP Location: Left Arm)   Pulse 61   Temp 98.4 F (36.9 C) (Oral)   Resp 18   Ht 5\' 7"  (1.702 m)   Wt 150 lb (68 kg)   SpO2 100%   BMI 23.49 kg/m    BP 121/78   Pulse 69   Temp 98.4 F (36.9 C) (Oral)   Resp 17   Ht 5\' 7"  (1.702 m)   Wt 150 lb (68 kg)   SpO2 96%   BMI 23.49 kg/m    Physical Exam 1430: Physical examination:  Nursing notes reviewed; Vital signs and O2 SAT reviewed;  Constitutional: Thin, In no acute distress; Head:  Normocephalic, atraumatic; Eyes: EOMI, PERRL, No scleral icterus; ENMT: Mouth and pharynx normal, Mucous membranes dry; Neck: Supple, Full range of motion, No lymphadenopathy; Cardiovascular: Regular rate and rhythm, No gallop; Respiratory: Breath sounds clear & equal bilaterally, No wheezes.  Speaking full sentences with ease, Normal respiratory effort/excursion; Chest: Nontender, Movement normal; Abdomen: Soft, Nontender, Nondistended, Normal bowel sounds; Genitourinary: No CVA tenderness; Extremities:  Pulses normal, No tenderness, No edema, No calf edema or asymmetry.; Neuro: AA&Ox3, Major CN grossly intact.  Speech clear. No gross focal motor or sensory deficits in extremities.; Skin: Color normal, Warm, Dry.   ED Treatments / Results  Labs (all labs ordered are listed, but only abnormal results are displayed)   EKG  EKG Interpretation  Date/Time:  Thursday September 13 2016 14:52:59 EDT Ventricular Rate:  67 PR Interval:    QRS Duration: 91 QT Interval:  388 QTC Calculation: 410 R Axis:   1 Text Interpretation:  Sinus rhythm Artifact When compared with ECG of 07/01/2014 No significant change was found Confirmed by [facility]  MD, Nunzio Cory [phone]) on 09/13/2016 2:59:50 PM       Radiology   Procedures Procedures (including critical care time)  Medications Ordered in ED Medications - No data to display   Initial Impression / Assessment and Plan / ED Course  I have reviewed the triage vital signs and the nursing notes.  Pertinent labs & imaging  results that were available during my care of the patient were reviewed by me and considered in my medical decision making (see chart for details).  MDM Reviewed: previous chart, nursing note and vitals Reviewed previous: labs and ECG Interpretation: labs and ECG      Results for orders placed or performed during the hospital encounter of 09/13/16  Comprehensive metabolic panel  Result Value Ref Range   Sodium 130 (L) 135 - 145 mmol/L   Potassium 3.7 3.5 - 5.1 mmol/L   Chloride 96 (L) 101 - 111 mmol/L   CO2 25 22 - 32 mmol/L   Glucose, Bld 425 (H) 65 - 99 mg/dL   BUN 20 6 - 20 mg/dL   Creatinine, Ser 1.02 0.61 - 1.24 mg/dL   Calcium 8.8 (L) 8.9 - 10.3 mg/dL   Total Protein 6.7 6.5 - 8.1 g/dL   Albumin 2.7 (L) 3.5 - 5.0 g/dL   AST 72 (H) 15 - 41 U/L   ALT 86 (H) 17 - 63 U/L   Alkaline Phosphatase 105 38 - 126 U/L   Total Bilirubin 1.1 0.3 - 1.2 mg/dL   GFR calc non Af Amer >60 >60 mL/min   GFR calc Af Amer >60 >60  mL/min   Anion gap 9 5 - 15  CBC with Differential  Result Value Ref Range   WBC 6.2 4.0 - 10.5 K/uL   RBC 4.15 (L) 4.22 - 5.81 MIL/uL   Hemoglobin 14.5 13.0 - 17.0 g/dL   HCT 40.2 39.0 - 52.0 %   MCV 96.9 78.0 - 100.0 fL   MCH 34.9 (H) 26.0 - 34.0 pg   MCHC 36.1 (H) 30.0 - 36.0 g/dL   RDW 12.9 11.5 - 15.5 %   Platelets PENDING 150 - 400 K/uL   Neutrophils Relative % 64 %   Neutro Abs 4.0 1.7 - 7.7 K/uL   Lymphocytes Relative 26 %   Lymphs Abs 1.6 0.7 - 4.0 K/uL   Monocytes Relative 8 %   Monocytes Absolute 0.5 0.1 - 1.0 K/uL   Eosinophils Relative 1 %   Eosinophils Absolute 0.1 0.0 - 0.7 K/uL   Basophils Relative 1 %   Basophils Absolute 0.0 0.0 - 0.1 K/uL  Ethanol  Result Value Ref Range   Alcohol, Ethyl (B) <5 <5 mg/dL  I-stat Chem 8, ED  Result Value Ref Range   Sodium 133 (L) 135 - 145 mmol/L   Potassium 3.9 3.5 - 5.1 mmol/L   Chloride 97 (L) 101 - 111 mmol/L   BUN 22 (H) 6 - 20 mg/dL   Creatinine, Ser 1.00 0.61 - 1.24 mg/dL   Glucose, Bld 438 (H) 65 - 99 mg/dL   Calcium, Ion 1.17 1.15 - 1.40 mmol/L   TCO2 26 0 - 100 mmol/L   Hemoglobin 13.9 13.0 - 17.0 g/dL   HCT 41.0 39.0 - 52.0 %    1535:  Glucose elevated today, but not ketotic. IVF boluses ordered for elevated CBG as well as low BP. BP improving, will re-check CBG. Pt's glucose on 09/03/16 was 703. Concern that pt is not taking his meds, f/u MD appointments.  T/C to Endocrinologist Dr. Dorris Fetch, case discussed, including:  HPI, pertinent PM/SHx, VS/PE, dx testing, ED course and treatment:  States he is very worried about pt and sent Police to his home, pt's Hgb A1c from 09/03/16 labs was 12.3% which places him at high risk for DKA, concerned about pt's lack of compliance with meds and ofc appts, requests to  admit pt to hospital for control of his CBG's and Social Work consult. Pt is agreeable to stay for observation admit (pt's family insistent).    1555:  T/C to [facility] Dr. Lorin Mercy, case discussed, including:  HPI, pertinent  PM/SHx, VS/PE, dx testing, ED course and treatment, as well as d/w Dr. Dorris Fetch:  Agreeable to observation admit.     Final Clinical Impressions(s) / ED Diagnoses   Final diagnoses:  None    New Prescriptions New Prescriptions   No medications on file     Francine Graven, DO 09/16/16 1530

## 2016-09-14 DIAGNOSIS — R739 Hyperglycemia, unspecified: Secondary | ICD-10-CM | POA: Diagnosis not present

## 2016-09-14 LAB — GLUCOSE, CAPILLARY
GLUCOSE-CAPILLARY: 313 mg/dL — AB (ref 65–99)
GLUCOSE-CAPILLARY: 276 mg/dL — AB (ref 65–99)
Glucose-Capillary: 461 mg/dL — ABNORMAL HIGH (ref 65–99)
Glucose-Capillary: 383 mg/dL — ABNORMAL HIGH (ref 65–99)
Glucose-Capillary: 116 mg/dL — ABNORMAL HIGH (ref 65–99)
Glucose-Capillary: 119 mg/dL — ABNORMAL HIGH (ref 65–99)

## 2016-09-14 LAB — CBC
HCT: 38.2 % — ABNORMAL LOW (ref 39.0–52.0)
Hemoglobin: 13.8 g/dL (ref 13.0–17.0)
MCH: 34.9 pg — ABNORMAL HIGH (ref 26.0–34.0)
MCHC: 36.1 g/dL — ABNORMAL HIGH (ref 30.0–36.0)
MCV: 96.7 fL (ref 78.0–100.0)
Platelets: 67 10*3/uL — ABNORMAL LOW (ref 150–400)
RBC: 3.95 MIL/uL — AB (ref 4.22–5.81)
RDW: 12.9 % (ref 11.5–15.5)
WBC: 6.1 10*3/uL (ref 4.0–10.5)

## 2016-09-14 LAB — HIV ANTIBODY (ROUTINE TESTING W REFLEX): HIV SCREEN 4TH GENERATION: NONREACTIVE

## 2016-09-14 LAB — BASIC METABOLIC PANEL
Anion gap: 6 (ref 5–15)
BUN: 19 mg/dL (ref 6–20)
CO2: 26 mmol/L (ref 22–32)
Calcium: 8.1 mg/dL — ABNORMAL LOW (ref 8.9–10.3)
Chloride: 99 mmol/L — ABNORMAL LOW (ref 101–111)
Creatinine, Ser: 0.92 mg/dL (ref 0.61–1.24)
GFR calc non Af Amer: >60 mL/min (ref >60)
Glucose, Bld: 371 mg/dL — ABNORMAL HIGH (ref 65–99)
POTASSIUM: 3.8 mmol/L (ref 3.5–5.1)
SODIUM: 131 mmol/L — AB (ref 135–145)

## 2016-09-14 MED ORDER — INSULIN GLARGINE 100 UNIT/ML ~~LOC~~ SOLN
20.0000 [IU] | Freq: Once | SUBCUTANEOUS | Status: AC
  Administered 2016-09-14: 20 [IU] via SUBCUTANEOUS
  Filled 2016-09-14: qty 0.2

## 2016-09-14 MED ORDER — INSULIN ASPART 100 UNIT/ML ~~LOC~~ SOLN
25.0000 [IU] | Freq: Once | SUBCUTANEOUS | Status: AC
  Administered 2016-09-14: 25 [IU] via SUBCUTANEOUS

## 2016-09-14 NOTE — Discharge Summary (Signed)
Physician Discharge Summary  Patient ID: Juan Juarez MRN: [id number] DOB/AGE: Sep 28, 1955 61 y.o. Primary [place], MD Admit date: 09/13/2016 Discharge date: 09/14/2016    Discharge Diagnoses:    Principal Problem:   Diabetes mellitus with hyperglycemia ([place]) Active Problems:   Smoker   Polysubstance abuse   Elevated LFTs   Protein-calorie malnutrition, severe (HCC)   Depression   Thrombocytopenia (HCC)   Allergies as of 09/14/2016   No Known Allergies     Medication List    TAKE these medications   allopurinol 100 MG tablet Commonly known as:  ZYLOPRIM Take 2 tablets by mouth daily as needed. For gout flare   alprazolam 2 MG tablet Commonly known as:  XANAX Take 0.5 tablets by mouth daily as needed.   canagliflozin 100 MG Tabs tablet Commonly known as:  INVOKANA Take 1 tablet (100 mg total) by mouth daily.   doxepin 10 MG capsule Commonly known as:  SINEQUAN Take 10 mg by mouth at bedtime.   lisinopril 10 MG tablet Commonly known as:  PRINIVIL,ZESTRIL Take 1 tablet (10 mg total) by mouth daily.   metFORMIN 1000 MG tablet Commonly known as:  GLUCOPHAGE Take 0.5 tablets (500 mg total) by mouth 2 (two) times daily with a meal.       Discharged Condition: improved     Consults: none   Significant Diagnostic Studies: No results found.  Lab Results: Basic Metabolic Panel:  Recent Labs  09/13/16 1441 09/13/16 1453 09/13/16 2214 09/14/16 0435  NA 130* 133*  --  131*  K 3.7 3.9  --  3.8  CL 96* 97*  --  99*  CO2 25  --   --  26  GLUCOSE 425* 438* 490* 371*  BUN 20 22*  --  19  CREATININE 1.02 1.00  --  0.92  CALCIUM 8.8*  --   --  8.1*   Liver Function Tests:  Recent Labs  09/13/16 1441  AST 72*  ALT 86*  ALKPHOS 105  BILITOT 1.1  PROT 6.7  ALBUMIN 2.7*     CBC:  Recent Labs  09/13/16 1441 09/13/16 1453 09/14/16 0435  WBC 6.2  --  6.1  NEUTROABS 4.0  --   --   HGB 14.5 13.9 13.8  HCT 40.2 41.0 38.2*   MCV 96.9  --  96.7  PLT 68*  --  67*    No results found for this or any previous visit (from the past 240 hour(s)).   Hospital Course:   This is a 61 years old male with history of multiple illness including diabetes Mellitus was admitted  Due to hyperglycemia. Patient is noncompliant on hiss treatment and follow up. He received insulin therapy and blood sugar improved. Case manager is arranging community support to assist him with his medications. He was strongly advised to comply on his medications and follow up.  Discharge Exam: Blood pressure 115/80, pulse 70, temperature 98.5 F (36.9 C), temperature source Oral, resp. rate 18, height 5\' 7"  (1.702 m), weight 68 kg (150 lb), SpO2 96 %.    Disposition:  home    Follow-up Information    Isidor Bromell, MD Follow up in 2 week(s).   Specialty:  Internal Medicine Contact information: [place] [address] [phone]           Signed: Rosita Fire   09/14/2016, 8:37 AM

## 2016-09-14 NOTE — Care Management Note (Signed)
Case Management Note  Patient Details  Name: Juan Juarez MRN: [id number] Date of Birth: 1956-06-03  Subjective/Objective:                  Pt admitted with hyperglycemia. He lives alone and is ind with ADL's. He reports people come into his home and steel his medications. He says his glucometer is broke and has an error message when he inserts the strips. CM instructed pt to take to [facility] where he purchased it and make sure he's using the correct strips he then says if he "messes with it for a minute it works". He admits to not going to MD appointments because he doesn't feel like he needs it. He is non-compliant with DM meds bc of the bad things he hears on TV. We discussed the consequences of not controlling DM. He asks about being put on insulin, explained this is a last resort and used when oral meds are no longer effective. He says his Anastasio Auerbach is too expensive to buy, I have called his pharmacy and they say it will cost him $3.70 to have monthly supply. I tell him this and his response is he has no re-fills filled and he has 3 refills left.  Pt reports mental illness and depression. Pt has psychiatry following outside of hospital. Pt complains of people stealing his medications, we talk about not allowing people into his home and he says he would then be isolated.  Action/Plan: Pt discharging home today with self care.   Expected Discharge Date:     09/14/2016             Expected Discharge Plan:  Home/Self Care  In-House Referral:  NA  Discharge planning Services  CM Consult  Post Acute Care Choice:  NA Choice offered to:  NA  Status of Service:  Completed, signed off  Sherald Barge, RN 09/14/2016, 1:32 PM

## 2016-09-14 NOTE — Progress Notes (Signed)
Subjective:  Patient was admitted due to hyperglycemia. Patient is noncomplaint on his treatment and his follow up. He was taking his medications regularly. He has history substance abuse.  Objective: Vital signs in last 24 hours: Temp:  [97.7 F (36.5 C)-98.5 F (36.9 C)] 98.5 F (36.9 C) (03/16 0610) Pulse Rate:  [61-70] 70 (03/16 0610) Resp:  [16-21] 18 (03/16 0610) BP: (108-130)/(70-86) 115/80 (03/16 0610) SpO2:  [96 %-100 %] 96 % (03/16 0610) Weight:  [68 kg (150 lb)] 68 kg (150 lb) (03/15 1413) Weight change:  Last BM Date: 09/13/16  Intake/Output from previous day: 03/15 0701 - 03/16 0700 In: 360 [P.O.:360] Out: -   PHYSICAL EXAM General appearance: alert and no distress Resp: clear to auscultation bilaterally Cardio: S1, S2 normal GI: soft, non-tender; bowel sounds normal; no masses,  no organomegaly Extremities: extremities normal, atraumatic, no cyanosis or edema  Lab Results:  Results for orders placed or performed during the hospital encounter of 09/13/16 (from the past 48 hour(s))  Urinalysis, Routine w reflex microscopic     Status: Abnormal   Collection Time: 09/13/16  2:21 PM  Result Value Ref Range   Color, Urine YELLOW YELLOW   APPearance CLEAR CLEAR   Specific Gravity, Urine 1.030 1.005 - 1.030   pH 6.0 5.0 - 8.0   Glucose, UA >=500 (A) NEGATIVE mg/dL   Hgb urine dipstick NEGATIVE NEGATIVE   Bilirubin Urine NEGATIVE NEGATIVE   Ketones, ur NEGATIVE NEGATIVE mg/dL   Protein, ur NEGATIVE NEGATIVE mg/dL   Nitrite NEGATIVE NEGATIVE   Leukocytes, UA NEGATIVE NEGATIVE   RBC / HPF 0-5 0 - 5 RBC/hpf   WBC, UA 0-5 0 - 5 WBC/hpf   Bacteria, UA NONE SEEN NONE SEEN  Urine rapid drug screen (hosp performed)     Status: Abnormal   Collection Time: 09/13/16  2:21 PM  Result Value Ref Range   Opiates NONE DETECTED NONE DETECTED   Cocaine POSITIVE (A) NONE DETECTED   Benzodiazepines POSITIVE (A) NONE DETECTED   Amphetamines NONE DETECTED NONE DETECTED   Tetrahydrocannabinol NONE DETECTED NONE DETECTED   Barbiturates NONE DETECTED NONE DETECTED    Comment:        DRUG SCREEN FOR MEDICAL PURPOSES ONLY.  IF CONFIRMATION IS NEEDED FOR ANY PURPOSE, NOTIFY LAB WITHIN 5 DAYS.        LOWEST DETECTABLE LIMITS FOR URINE DRUG SCREEN Drug Class       Cutoff (ng/mL) Amphetamine      1000 Barbiturate      200 Benzodiazepine   008 Tricyclics       676 Opiates          300 Cocaine          300 THC              50   Comprehensive metabolic panel     Status: Abnormal   Collection Time: 09/13/16  2:41 PM  Result Value Ref Range   Sodium 130 (L) 135 - 145 mmol/L   Potassium 3.7 3.5 - 5.1 mmol/L   Chloride 96 (L) 101 - 111 mmol/L   CO2 25 22 - 32 mmol/L   Glucose, Bld 425 (H) 65 - 99 mg/dL   BUN 20 6 - 20 mg/dL   Creatinine, Ser 1.02 0.61 - 1.24 mg/dL   Calcium 8.8 (L) 8.9 - 10.3 mg/dL   Total Protein 6.7 6.5 - 8.1 g/dL   Albumin 2.7 (L) 3.5 - 5.0 g/dL   AST 72 (H) 15 -  41 U/L   ALT 86 (H) 17 - 63 U/L   Alkaline Phosphatase 105 38 - 126 U/L   Total Bilirubin 1.1 0.3 - 1.2 mg/dL   GFR calc non Af Amer >60 >60 mL/min   GFR calc Af Amer >60 >60 mL/min    Comment: (NOTE) The eGFR has been calculated using the CKD EPI equation. This calculation has not been validated in all clinical situations. eGFR's persistently <60 mL/min signify possible Chronic Kidney Disease.    Anion gap 9 5 - 15  CBC with Differential     Status: Abnormal   Collection Time: 09/13/16  2:41 PM  Result Value Ref Range   WBC 6.2 4.0 - 10.5 K/uL   RBC 4.15 (L) 4.22 - 5.81 MIL/uL   Hemoglobin 14.5 13.0 - 17.0 g/dL   HCT 40.2 39.0 - 52.0 %   MCV 96.9 78.0 - 100.0 fL   MCH 34.9 (H) 26.0 - 34.0 pg   MCHC 36.1 (H) 30.0 - 36.0 g/dL   RDW 12.9 11.5 - 15.5 %   Platelets 68 (L) 150 - 400 K/uL    Comment: PLATELET COUNT CONFIRMED BY SMEAR LARGE PLATELETS PRESENT SPECIMEN CHECKED FOR CLOTS    Neutrophils Relative % 64 %   Neutro Abs 4.0 1.7 - 7.7 K/uL   Lymphocytes  Relative 26 %   Lymphs Abs 1.6 0.7 - 4.0 K/uL   Monocytes Relative 8 %   Monocytes Absolute 0.5 0.1 - 1.0 K/uL   Eosinophils Relative 1 %   Eosinophils Absolute 0.1 0.0 - 0.7 K/uL   Basophils Relative 1 %   Basophils Absolute 0.0 0.0 - 0.1 K/uL  Ethanol     Status: None   Collection Time: 09/13/16  2:41 PM  Result Value Ref Range   Alcohol, Ethyl (B) <5 <5 mg/dL    Comment:        LOWEST DETECTABLE LIMIT FOR SERUM ALCOHOL IS 5 mg/dL FOR MEDICAL PURPOSES ONLY   I-stat Chem 8, ED     Status: Abnormal   Collection Time: 09/13/16  2:53 PM  Result Value Ref Range   Sodium 133 (L) 135 - 145 mmol/L   Potassium 3.9 3.5 - 5.1 mmol/L   Chloride 97 (L) 101 - 111 mmol/L   BUN 22 (H) 6 - 20 mg/dL   Creatinine, Ser 1.00 0.61 - 1.24 mg/dL   Glucose, Bld 438 (H) 65 - 99 mg/dL   Calcium, Ion 1.17 1.15 - 1.40 mmol/L   TCO2 26 0 - 100 mmol/L   Hemoglobin 13.9 13.0 - 17.0 g/dL   HCT 41.0 39.0 - 52.0 %  CBG monitoring, ED     Status: Abnormal   Collection Time: 09/13/16  4:26 PM  Result Value Ref Range   Glucose-Capillary 335 (H) 65 - 99 mg/dL  HIV antibody (Routine Testing)     Status: None   Collection Time: 09/13/16  5:21 PM  Result Value Ref Range   HIV Screen 4th Generation wRfx Non Reactive Non Reactive    Comment: (NOTE) Performed At: [facility] [address] [place], [place] [id number] Lindon Romp MD QQ:[id number]   Glucose, capillary     Status: Abnormal   Collection Time: 09/13/16  9:46 PM  Result Value Ref Range   Glucose-Capillary 552 (HH) 65 - 99 mg/dL   Comment 1 Notify RN    Comment 2 Document in Chart   Glucose, random     Status: Abnormal   Collection Time: 09/13/16 10:14 PM  Result Value Ref Range   Glucose, Bld 490 (H) 65 - 99 mg/dL  Glucose, capillary     Status: Abnormal   Collection Time: 09/14/16  1:57 AM  Result Value Ref Range   Glucose-Capillary 313 (H) 65 - 99 mg/dL   Comment 1 Notify RN    Comment 2 Document in Chart   Basic metabolic  panel     Status: Abnormal   Collection Time: 09/14/16  4:35 AM  Result Value Ref Range   Sodium 131 (L) 135 - 145 mmol/L   Potassium 3.8 3.5 - 5.1 mmol/L   Chloride 99 (L) 101 - 111 mmol/L   CO2 26 22 - 32 mmol/L   Glucose, Bld 371 (H) 65 - 99 mg/dL   BUN 19 6 - 20 mg/dL   Creatinine, Ser 0.92 0.61 - 1.24 mg/dL   Calcium 8.1 (L) 8.9 - 10.3 mg/dL   GFR calc non Af Amer >60 >60 mL/min   GFR calc Af Amer >60 >60 mL/min    Comment: (NOTE) The eGFR has been calculated using the CKD EPI equation. This calculation has not been validated in all clinical situations. eGFR's persistently <60 mL/min signify possible Chronic Kidney Disease.    Anion gap 6 5 - 15  CBC     Status: Abnormal   Collection Time: 09/14/16  4:35 AM  Result Value Ref Range   WBC 6.1 4.0 - 10.5 K/uL   RBC 3.95 (L) 4.22 - 5.81 MIL/uL   Hemoglobin 13.8 13.0 - 17.0 g/dL   HCT 38.2 (L) 39.0 - 52.0 %   MCV 96.7 78.0 - 100.0 fL   MCH 34.9 (H) 26.0 - 34.0 pg   MCHC 36.1 (H) 30.0 - 36.0 g/dL   RDW 12.9 11.5 - 15.5 %   Platelets 67 (L) 150 - 400 K/uL    Comment: SPECIMEN CHECKED FOR CLOTS CONSISTENT WITH PREVIOUS RESULT   Glucose, capillary     Status: Abnormal   Collection Time: 09/14/16  7:32 AM  Result Value Ref Range   Glucose-Capillary 276 (H) 65 - 99 mg/dL    ABGS  Recent Labs  09/13/16 1453  TCO2 26   CULTURES No results found for this or any previous visit (from the past 240 hour(s)). Studies/Results: No results found.  Medications: I have reviewed the patient's current medications.  Assesment:  Principal Problem:   Diabetes mellitus with hyperglycemia ([place]) Active Problems:   Smoker   Polysubstance abuse   Elevated LFTs   Protein-calorie malnutrition, severe ([facility])   Depression   Thrombocytopenia ([facility])    Plan:  Medications  Reviewed Continue insulin therapy for now Talk to case manager to try get his medications and set with with community resource to himl  With his medications     LOS: 0 days   Juan Juarez 09/14/2016, 8:09 AM

## 2016-09-14 NOTE — Clinical Social Work Note (Signed)
Clinical Social Work Assessment  Patient Details  Name: Juan Juarez MRN: [id number] Date of Birth: May 10, 1956  Date of referral:  09/14/16               Reason for consult:  Substance Use/ETOH Abuse                Permission sought to share information with:    Permission granted to share information::     Name::        Agency::     Relationship::     Contact Information:     Housing/Transportation Living arrangements for the past 2 months:  Single Family Home Source of Information:  Patient Patient Interpreter Needed:  None Criminal Activity/Legal Involvement Pertinent to Current Situation/Hospitalization:  No - Comment as needed Significant Relationships:  Parents Lives with:  Self Do you feel safe going back to the place where you live?  Yes Need for family participation in patient care:  No (Coment)  Care giving concerns:  None reported. Pt is independent with ADLs.    Social Worker assessment / plan:  CSW met with pt at bedside. Pt's mother was present and CSW wrote on paper what consult was about. He requested that she step out during conversation. Pt states he lives alone and is independent with ADLs and plans to return home. He is on disability. Pt describes his mom as his best support. CSW asked about substance abuse and pt admits to using cocaine "a little when people come by." He states that he thought it would make him feel better, but it didn't. Pt is on probation and has to take regular drug tests. He does not feel that his substance abuse is a problem and feels he could stop on his own if he wanted to, just as he did with alcohol. Pt sees Juan Peters, NP in [place] due to depression. No other referrals needed. CSW signing off.  Employment status:  Disabled (Comment on whether or not currently receiving Disability) Insurance information:  Medicare PT Recommendations:  Not assessed at this time Information / Referral to community resources:  Outpatient  Substance Abuse Treatment Options  Patient/Family's Response to care: Pt refuses any need for outpatient referrals, but did accept list of providers if needed.   Patient/Family's Understanding of and Emotional Response to Diagnosis, Current Treatment, and Prognosis:  Pt states he is aware of how drugs may be impacting his health, but does not appear to be concerned.  Emotional Assessment Appearance:  Appears stated age Attitude/Demeanor/Rapport:  Other (Cooperative) Affect (typically observed):  Accepting Orientation:  Oriented to Self, Oriented to Place, Oriented to  Time, Oriented to Situation Alcohol / Substance use:  Illicit Drugs Psych involvement (Current and /or in the community):  Outpatient Provider  Discharge Needs  Concerns to be addressed:  Substance Abuse Concerns Readmission within the last 30 days:  No Current discharge risk:  Substance Abuse Barriers to Discharge:  No Barriers Identified   Salome Arnt, [place] 09/14/2016, 1:52 PM [phone]

## 2016-09-14 NOTE — Progress Notes (Signed)
Discharge instructions reviewed with patient and his mother and sister.  All verbalized understanding of instructions.  Discharged to home with his family.

## 2016-09-14 NOTE — Progress Notes (Signed)
CRITICAL VALUE ALERT  Critical value received:  BS 461  Date of notification:  09/14/16  Time of notification:  8412  Critical value read back:yes  Nurse who received alert:  Lenon Curt RN  MD notified (1st page):  Dr Legrand Rams  Time of first page:  55  MD notified (2nd page):  Time of second page:  Responding MD:  Thad Ranger  Time MD responded:  1130

## 2016-09-27 DIAGNOSIS — F17200 Nicotine dependence, unspecified, uncomplicated: Secondary | ICD-10-CM | POA: Diagnosis not present

## 2016-09-27 DIAGNOSIS — M1A00X0 Idiopathic chronic gout, unspecified site, without tophus (tophi): Secondary | ICD-10-CM | POA: Diagnosis not present

## 2016-09-27 DIAGNOSIS — I10 Essential (primary) hypertension: Secondary | ICD-10-CM | POA: Diagnosis not present

## 2016-09-27 DIAGNOSIS — E1165 Type 2 diabetes mellitus with hyperglycemia: Secondary | ICD-10-CM | POA: Diagnosis not present

## 2016-10-19 DIAGNOSIS — R935 Abnormal findings on diagnostic imaging of other abdominal regions, including retroperitoneum: Secondary | ICD-10-CM | POA: Diagnosis not present

## 2016-10-19 DIAGNOSIS — M5489 Other dorsalgia: Secondary | ICD-10-CM | POA: Diagnosis not present

## 2016-10-19 DIAGNOSIS — M545 Low back pain: Secondary | ICD-10-CM | POA: Diagnosis not present

## 2016-10-19 DIAGNOSIS — R52 Pain, unspecified: Secondary | ICD-10-CM | POA: Diagnosis not present

## 2016-10-19 DIAGNOSIS — R9431 Abnormal electrocardiogram [ECG] [EKG]: Secondary | ICD-10-CM | POA: Diagnosis not present

## 2017-01-15 DIAGNOSIS — F331 Major depressive disorder, recurrent, moderate: Secondary | ICD-10-CM | POA: Diagnosis not present

## 2017-01-15 DIAGNOSIS — F411 Generalized anxiety disorder: Secondary | ICD-10-CM | POA: Diagnosis not present

## 2017-01-19 DIAGNOSIS — S3992XA Unspecified injury of lower back, initial encounter: Secondary | ICD-10-CM | POA: Diagnosis not present

## 2017-01-19 DIAGNOSIS — M47816 Spondylosis without myelopathy or radiculopathy, lumbar region: Secondary | ICD-10-CM | POA: Diagnosis not present

## 2017-01-19 DIAGNOSIS — F17200 Nicotine dependence, unspecified, uncomplicated: Secondary | ICD-10-CM | POA: Diagnosis not present

## 2017-01-19 DIAGNOSIS — S199XXA Unspecified injury of neck, initial encounter: Secondary | ICD-10-CM | POA: Diagnosis not present

## 2017-01-19 DIAGNOSIS — Z79899 Other long term (current) drug therapy: Secondary | ICD-10-CM | POA: Diagnosis not present

## 2017-01-19 DIAGNOSIS — Z7984 Long term (current) use of oral hypoglycemic drugs: Secondary | ICD-10-CM | POA: Diagnosis not present

## 2017-01-19 DIAGNOSIS — M542 Cervicalgia: Secondary | ICD-10-CM | POA: Diagnosis not present

## 2017-01-19 DIAGNOSIS — M545 Low back pain: Secondary | ICD-10-CM | POA: Diagnosis not present

## 2017-01-19 DIAGNOSIS — E119 Type 2 diabetes mellitus without complications: Secondary | ICD-10-CM | POA: Diagnosis not present

## 2017-01-19 DIAGNOSIS — M47812 Spondylosis without myelopathy or radiculopathy, cervical region: Secondary | ICD-10-CM | POA: Diagnosis not present

## 2017-03-03 ENCOUNTER — Emergency Department (HOSPITAL_COMMUNITY): Payer: Medicare Other

## 2017-03-03 ENCOUNTER — Emergency Department (HOSPITAL_COMMUNITY)
Admission: EM | Admit: 2017-03-03 | Discharge: 2017-03-03 | Disposition: A | Discharge status: Home / Self Care | Payer: Medicare Other | Attending: Emergency Medicine | Admitting: Emergency Medicine

## 2017-03-03 ENCOUNTER — Encounter (HOSPITAL_COMMUNITY): Payer: Self-pay | Admitting: *Deleted

## 2017-03-03 DIAGNOSIS — S199XXA Unspecified injury of neck, initial encounter: Secondary | ICD-10-CM | POA: Diagnosis not present

## 2017-03-03 DIAGNOSIS — Z79899 Other long term (current) drug therapy: Secondary | ICD-10-CM | POA: Insufficient documentation

## 2017-03-03 DIAGNOSIS — S0083XA Contusion of other part of head, initial encounter: Secondary | ICD-10-CM

## 2017-03-03 DIAGNOSIS — Z7984 Long term (current) use of oral hypoglycemic drugs: Secondary | ICD-10-CM | POA: Diagnosis not present

## 2017-03-03 DIAGNOSIS — Y939 Activity, unspecified: Secondary | ICD-10-CM | POA: Diagnosis not present

## 2017-03-03 DIAGNOSIS — Y907 Blood alcohol level of 200-239 mg/100 ml: Secondary | ICD-10-CM | POA: Insufficient documentation

## 2017-03-03 DIAGNOSIS — Y999 Unspecified external cause status: Secondary | ICD-10-CM | POA: Insufficient documentation

## 2017-03-03 DIAGNOSIS — S0993XA Unspecified injury of face, initial encounter: Secondary | ICD-10-CM | POA: Diagnosis not present

## 2017-03-03 DIAGNOSIS — F17210 Nicotine dependence, cigarettes, uncomplicated: Secondary | ICD-10-CM | POA: Diagnosis not present

## 2017-03-03 DIAGNOSIS — I10 Essential (primary) hypertension: Secondary | ICD-10-CM | POA: Diagnosis not present

## 2017-03-03 DIAGNOSIS — F918 Other conduct disorders: Secondary | ICD-10-CM | POA: Diagnosis not present

## 2017-03-03 DIAGNOSIS — S0010XA Contusion of unspecified eyelid and periocular area, initial encounter: Secondary | ICD-10-CM | POA: Diagnosis not present

## 2017-03-03 DIAGNOSIS — S01111A Laceration without foreign body of right eyelid and periocular area, initial encounter: Secondary | ICD-10-CM | POA: Diagnosis not present

## 2017-03-03 DIAGNOSIS — F10920 Alcohol use, unspecified with intoxication, uncomplicated: Secondary | ICD-10-CM

## 2017-03-03 DIAGNOSIS — R4689 Other symptoms and signs involving appearance and behavior: Secondary | ICD-10-CM

## 2017-03-03 DIAGNOSIS — T148XXA Other injury of unspecified body region, initial encounter: Secondary | ICD-10-CM | POA: Diagnosis not present

## 2017-03-03 DIAGNOSIS — Y92410 Unspecified street and highway as the place of occurrence of the external cause: Secondary | ICD-10-CM | POA: Insufficient documentation

## 2017-03-03 DIAGNOSIS — E119 Type 2 diabetes mellitus without complications: Secondary | ICD-10-CM | POA: Insufficient documentation

## 2017-03-03 DIAGNOSIS — S0990XA Unspecified injury of head, initial encounter: Secondary | ICD-10-CM | POA: Diagnosis not present

## 2017-03-03 LAB — RAPID HIV SCREEN (HIV 1/2 AB+AG)
HIV 1/2 Antibodies: NONREACTIVE
HIV-1 P24 Antigen - HIV24: NONREACTIVE

## 2017-03-03 LAB — CBC WITH DIFFERENTIAL/PLATELET
Basophils Absolute: 0 10*3/uL (ref 0.0–0.1)
Basophils Relative: 0 %
Eosinophils Absolute: 0.1 10*3/uL (ref 0.0–0.7)
Eosinophils Relative: 2 %
HCT: 42.5 % (ref 39.0–52.0)
Hemoglobin: 15.1 g/dL (ref 13.0–17.0)
LYMPHS ABS: 2.1 10*3/uL (ref 0.7–4.0)
LYMPHS PCT: 33 %
MCH: 34.6 pg — AB (ref 26.0–34.0)
MCHC: 35.5 g/dL (ref 30.0–36.0)
MCV: 97.5 fL (ref 78.0–100.0)
MONO ABS: 0.5 10*3/uL (ref 0.1–1.0)
MONOS PCT: 7 %
Neutro Abs: 3.5 10*3/uL (ref 1.7–7.7)
Neutrophils Relative %: 57 %
PLATELETS: 83 10*3/uL — AB (ref 150–400)
RBC: 4.36 MIL/uL (ref 4.22–5.81)
RDW: 13.8 % (ref 11.5–15.5)
WBC: 6.2 10*3/uL (ref 4.0–10.5)

## 2017-03-03 LAB — COMPREHENSIVE METABOLIC PANEL
ALK PHOS: 95 U/L (ref 38–126)
ALT: 62 U/L (ref 17–63)
AST: 69 U/L — ABNORMAL HIGH (ref 15–41)
Albumin: 3.4 g/dL — ABNORMAL LOW (ref 3.5–5.0)
Anion gap: 14 (ref 5–15)
BUN: 12 mg/dL (ref 6–20)
CALCIUM: 9.1 mg/dL (ref 8.9–10.3)
CO2: 20 mmol/L — AB (ref 22–32)
CREATININE: 1.02 mg/dL (ref 0.61–1.24)
Chloride: 106 mmol/L (ref 101–111)
GFR calc non Af Amer: >60 mL/min (ref >60)
GLUCOSE: 188 mg/dL — AB (ref 65–99)
Potassium: 3.8 mmol/L (ref 3.5–5.1)
SODIUM: 140 mmol/L (ref 135–145)
Total Bilirubin: 1.1 mg/dL (ref 0.3–1.2)
Total Protein: 7.6 g/dL (ref 6.5–8.1)

## 2017-03-03 LAB — ETHANOL: Alcohol, Ethyl (B): 210 mg/dL — ABNORMAL HIGH (ref <5)

## 2017-03-03 LAB — CBG MONITORING, ED: Glucose-Capillary: 186 mg/dL — ABNORMAL HIGH (ref 65–99)

## 2017-03-03 LAB — SALICYLATE LEVEL: Salicylate Lvl: <7 mg/dL (ref 2.8–30.0)

## 2017-03-03 LAB — ACETAMINOPHEN LEVEL: Acetaminophen (Tylenol), Serum: <10 ug/mL — ABNORMAL LOW (ref 10–30)

## 2017-03-03 MED ORDER — ZIPRASIDONE MESYLATE 20 MG IM SOLR
20.0000 mg | Freq: Once | INTRAMUSCULAR | Status: AC
  Administered 2017-03-03: 20 mg via INTRAMUSCULAR

## 2017-03-03 MED ORDER — LIDOCAINE-EPINEPHRINE (PF) 2 %-1:200000 IJ SOLN
20.0000 mL | Freq: Once | INTRAMUSCULAR | Status: AC
  Administered 2017-03-03: 20 mL
  Filled 2017-03-03: qty 20

## 2017-03-03 MED ORDER — STERILE WATER FOR INJECTION IJ SOLN
INTRAMUSCULAR | Status: AC
  Administered 2017-03-03: 1.2 mL
  Filled 2017-03-03: qty 10

## 2017-03-03 MED ORDER — BACITRACIN-NEOMYCIN-POLYMYXIN 400-5-5000 EX OINT
TOPICAL_OINTMENT | CUTANEOUS | Status: AC
  Filled 2017-03-03: qty 2

## 2017-03-03 MED ORDER — ZIPRASIDONE MESYLATE 20 MG IM SOLR
INTRAMUSCULAR | Status: AC
  Administered 2017-03-03: 20 mg via INTRAMUSCULAR
  Filled 2017-03-03: qty 20
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
## 2017-03-03 NOTE — ED Notes (Signed)
Meal provided  Pt eating voraciously

## 2017-03-03 NOTE — Discharge Instructions (Signed)
Suture removal in 5 days

## 2017-03-03 NOTE — ED Provider Notes (Addendum)
Pt had been sleeping.  He will now wake up and answer questions . Discussed findings with patient.  He denies any other complaints.  Will get him up and walking and then patient should be stable for discharge.   Dorie Rank, MD 03/03/17 1053  Pt has been up and walking.  He tolerated breakfast.  Pt is ready for discharge.   Dorie Rank, MD 03/03/17 1252

## 2017-03-03 NOTE — ED Notes (Signed)
Pt has called sister to gain a ride home

## 2017-03-04 LAB — HEPATITIS C ANTIBODY (REFLEX)

## 2017-03-04 LAB — HEPATITIS B SURFACE ANTIGEN: HEP B S AG: NEGATIVE

## 2017-03-04 LAB — COMMENT2 - HEP PANEL

## 2017-03-06 LAB — HCV RNA QUANT: HCV Quantitative: UNDETERMINED IU/mL (ref >50)

## 2017-03-13 DIAGNOSIS — F17200 Nicotine dependence, unspecified, uncomplicated: Secondary | ICD-10-CM | POA: Diagnosis not present

## 2017-03-13 DIAGNOSIS — M1A00X0 Idiopathic chronic gout, unspecified site, without tophus (tophi): Secondary | ICD-10-CM | POA: Diagnosis not present

## 2017-03-13 DIAGNOSIS — I10 Essential (primary) hypertension: Secondary | ICD-10-CM | POA: Diagnosis not present

## 2017-03-13 DIAGNOSIS — E119 Type 2 diabetes mellitus without complications: Secondary | ICD-10-CM | POA: Diagnosis not present

## 2017-04-23 DIAGNOSIS — F411 Generalized anxiety disorder: Secondary | ICD-10-CM | POA: Diagnosis not present

## 2017-04-23 DIAGNOSIS — F331 Major depressive disorder, recurrent, moderate: Secondary | ICD-10-CM | POA: Diagnosis not present

## 2017-05-01 ENCOUNTER — Emergency Department (HOSPITAL_COMMUNITY): Payer: Medicare Other

## 2017-05-01 ENCOUNTER — Observation Stay (HOSPITAL_COMMUNITY)
Admission: EM | Admit: 2017-05-01 | Discharge: 2017-05-03 | Disposition: A | Discharge status: Home / Self Care | Payer: Medicare Other | Attending: Internal Medicine | Admitting: Internal Medicine

## 2017-05-01 ENCOUNTER — Encounter (HOSPITAL_COMMUNITY): Payer: Self-pay | Admitting: *Deleted

## 2017-05-01 DIAGNOSIS — E46 Unspecified protein-calorie malnutrition: Secondary | ICD-10-CM

## 2017-05-01 DIAGNOSIS — R161 Splenomegaly, not elsewhere classified: Secondary | ICD-10-CM | POA: Diagnosis not present

## 2017-05-01 DIAGNOSIS — I10 Essential (primary) hypertension: Secondary | ICD-10-CM | POA: Insufficient documentation

## 2017-05-01 DIAGNOSIS — R770 Abnormality of albumin: Secondary | ICD-10-CM | POA: Insufficient documentation

## 2017-05-01 DIAGNOSIS — D696 Thrombocytopenia, unspecified: Secondary | ICD-10-CM | POA: Diagnosis not present

## 2017-05-01 DIAGNOSIS — B1710 Acute hepatitis C without hepatic coma: Secondary | ICD-10-CM | POA: Insufficient documentation

## 2017-05-01 DIAGNOSIS — R7989 Other specified abnormal findings of blood chemistry: Secondary | ICD-10-CM | POA: Diagnosis not present

## 2017-05-01 DIAGNOSIS — E8809 Other disorders of plasma-protein metabolism, not elsewhere classified: Secondary | ICD-10-CM

## 2017-05-01 DIAGNOSIS — Z7984 Long term (current) use of oral hypoglycemic drugs: Secondary | ICD-10-CM | POA: Insufficient documentation

## 2017-05-01 DIAGNOSIS — E860 Dehydration: Secondary | ICD-10-CM | POA: Insufficient documentation

## 2017-05-01 DIAGNOSIS — R9431 Abnormal electrocardiogram [ECG] [EKG]: Secondary | ICD-10-CM | POA: Diagnosis not present

## 2017-05-01 DIAGNOSIS — Z79899 Other long term (current) drug therapy: Secondary | ICD-10-CM | POA: Insufficient documentation

## 2017-05-01 DIAGNOSIS — F17210 Nicotine dependence, cigarettes, uncomplicated: Secondary | ICD-10-CM | POA: Insufficient documentation

## 2017-05-01 DIAGNOSIS — R112 Nausea with vomiting, unspecified: Secondary | ICD-10-CM | POA: Diagnosis not present

## 2017-05-01 DIAGNOSIS — E1165 Type 2 diabetes mellitus with hyperglycemia: Secondary | ICD-10-CM | POA: Diagnosis not present

## 2017-05-01 DIAGNOSIS — R1110 Vomiting, unspecified: Secondary | ICD-10-CM | POA: Diagnosis present

## 2017-05-01 DIAGNOSIS — E871 Hypo-osmolality and hyponatremia: Secondary | ICD-10-CM | POA: Diagnosis not present

## 2017-05-01 DIAGNOSIS — F1410 Cocaine abuse, uncomplicated: Secondary | ICD-10-CM | POA: Diagnosis not present

## 2017-05-01 DIAGNOSIS — B1920 Unspecified viral hepatitis C without hepatic coma: Secondary | ICD-10-CM

## 2017-05-01 HISTORY — DX: Patient's other noncompliance with medication regimen for other reason: Z91.148

## 2017-05-01 HISTORY — DX: Unspecified viral hepatitis C without hepatic coma: B19.20

## 2017-05-01 HISTORY — DX: Thrombocytopenia, unspecified: D69.6

## 2017-05-01 HISTORY — DX: Other psychoactive substance abuse, uncomplicated: F19.10

## 2017-05-01 HISTORY — DX: Essential (primary) hypertension: I10

## 2017-05-01 HISTORY — DX: Patient's other noncompliance with medication regimen: Z91.14

## 2017-05-01 HISTORY — DX: Unspecified severe protein-calorie malnutrition: E43

## 2017-05-01 LAB — CBC WITH DIFFERENTIAL/PLATELET
BASOS PCT: 1 %
Basophils Absolute: 0.1 10*3/uL (ref 0.0–0.1)
Eosinophils Absolute: 0 10*3/uL (ref 0.0–0.7)
Eosinophils Relative: 0 %
HCT: 44.2 % (ref 39.0–52.0)
HEMOGLOBIN: 15.7 g/dL (ref 13.0–17.0)
LYMPHS PCT: 31 %
Lymphs Abs: 1.5 10*3/uL (ref 0.7–4.0)
MCH: 33.8 pg (ref 26.0–34.0)
MCHC: 35.5 g/dL (ref 30.0–36.0)
MCV: 95.3 fL (ref 78.0–100.0)
MONOS PCT: 10 %
Monocytes Absolute: 0.5 10*3/uL (ref 0.1–1.0)
NEUTROS ABS: 2.9 10*3/uL (ref 1.7–7.7)
NEUTROS PCT: 58 %
Platelets: 58 10*3/uL — ABNORMAL LOW (ref 150–400)
RBC: 4.64 MIL/uL (ref 4.22–5.81)
RDW: 13.5 % (ref 11.5–15.5)
WBC: 4.9 10*3/uL (ref 4.0–10.5)

## 2017-05-01 LAB — URINALYSIS, ROUTINE W REFLEX MICROSCOPIC
Bacteria, UA: NONE SEEN
GLUCOSE, UA: 150 mg/dL — AB
HGB URINE DIPSTICK: NEGATIVE
KETONES UR: 80 mg/dL — AB
LEUKOCYTES UA: NEGATIVE
NITRITE: NEGATIVE
PH: 5 (ref 5.0–8.0)
Protein, ur: 30 mg/dL — AB
Specific Gravity, Urine: 1.026 (ref 1.005–1.030)

## 2017-05-01 LAB — ETHANOL

## 2017-05-01 LAB — TROPONIN I

## 2017-05-01 LAB — RAPID URINE DRUG SCREEN, HOSP PERFORMED
Amphetamines: NOT DETECTED
BARBITURATES: NOT DETECTED
Benzodiazepines: POSITIVE — AB
Cocaine: POSITIVE — AB
Opiates: NOT DETECTED
Tetrahydrocannabinol: POSITIVE — AB

## 2017-05-01 LAB — COMPREHENSIVE METABOLIC PANEL
ALBUMIN: 3 g/dL — AB (ref 3.5–5.0)
ALT: 104 U/L — ABNORMAL HIGH (ref 17–63)
ANION GAP: 16 — AB (ref 5–15)
AST: 118 U/L — ABNORMAL HIGH (ref 15–41)
Alkaline Phosphatase: 101 U/L (ref 38–126)
BILIRUBIN TOTAL: 3 mg/dL — AB (ref 0.3–1.2)
BUN: 18 mg/dL (ref 6–20)
CHLORIDE: 93 mmol/L — AB (ref 101–111)
CO2: 19 mmol/L — ABNORMAL LOW (ref 22–32)
Calcium: 8.6 mg/dL — ABNORMAL LOW (ref 8.9–10.3)
Creatinine, Ser: 1.05 mg/dL (ref 0.61–1.24)
GFR calc Af Amer: >60 mL/min (ref >60)
GFR calc non Af Amer: >60 mL/min (ref >60)
GLUCOSE: 186 mg/dL — AB (ref 65–99)
POTASSIUM: 3.7 mmol/L (ref 3.5–5.1)
SODIUM: 128 mmol/L — AB (ref 135–145)
TOTAL PROTEIN: 7.6 g/dL (ref 6.5–8.1)

## 2017-05-01 LAB — GLUCOSE, CAPILLARY: GLUCOSE-CAPILLARY: 178 mg/dL — AB (ref 65–99)

## 2017-05-01 LAB — CBG MONITORING, ED
GLUCOSE-CAPILLARY: 178 mg/dL — AB (ref 65–99)
Glucose-Capillary: 197 mg/dL — ABNORMAL HIGH (ref 65–99)

## 2017-05-01 LAB — LIPASE, BLOOD: LIPASE: 42 U/L (ref 11–51)

## 2017-05-01 MED ORDER — INSULIN ASPART 100 UNIT/ML ~~LOC~~ SOLN: 0.0000 [IU] | Freq: Every day | SUBCUTANEOUS | Status: DC

## 2017-05-01 MED ORDER — SODIUM CHLORIDE 0.9 % IV SOLN
INTRAVENOUS | Status: DC
  Administered 2017-05-01: 17:00:00 via INTRAVENOUS

## 2017-05-01 MED ORDER — ONDANSETRON HCL 4 MG PO TABS: 4.0000 mg | ORAL_TABLET | Freq: Four times a day (QID) | ORAL | Status: DC | PRN

## 2017-05-01 MED ORDER — ALPRAZOLAM 1 MG PO TABS
2.0000 mg | ORAL_TABLET | Freq: Every day | ORAL | Status: DC | PRN
  Administered 2017-05-01 – 2017-05-02 (×2): 2 mg via ORAL
  Filled 2017-05-01 (×2): qty 2

## 2017-05-01 MED ORDER — ONDANSETRON HCL 4 MG/2ML IJ SOLN
4.0000 mg | Freq: Four times a day (QID) | INTRAMUSCULAR | Status: DC | PRN
  Administered 2017-05-01 – 2017-05-02 (×2): 4 mg via INTRAVENOUS
  Filled 2017-05-01 (×2): qty 2

## 2017-05-01 MED ORDER — INSULIN ASPART 100 UNIT/ML ~~LOC~~ SOLN
0.0000 [IU] | Freq: Three times a day (TID) | SUBCUTANEOUS | Status: DC
  Administered 2017-05-02 (×2): 2 [IU] via SUBCUTANEOUS
  Administered 2017-05-02 – 2017-05-03 (×2): 3 [IU] via SUBCUTANEOUS

## 2017-05-01 MED ORDER — SODIUM CHLORIDE 0.9 % IV BOLUS (SEPSIS)
1000.0000 mL | Freq: Once | INTRAVENOUS | Status: AC
  Administered 2017-05-01: 1000 mL via INTRAVENOUS

## 2017-05-01 MED ORDER — SODIUM CHLORIDE 0.9 % IV BOLUS (SEPSIS)
1000.0000 mL | Freq: Once | INTRAVENOUS | Status: DC
Start: 2017-05-01 — End: 2017-05-01

## 2017-05-01 MED ORDER — POTASSIUM CHLORIDE IN NACL 20-0.9 MEQ/L-% IV SOLN
INTRAVENOUS | Status: DC
  Administered 2017-05-01 – 2017-05-03 (×3): via INTRAVENOUS

## 2017-05-01 MED ORDER — ACETAMINOPHEN 650 MG RE SUPP: 650.0000 mg | Freq: Four times a day (QID) | RECTAL | Status: DC | PRN

## 2017-05-01 MED ORDER — ACETAMINOPHEN 325 MG PO TABS: 650.0000 mg | ORAL_TABLET | Freq: Four times a day (QID) | ORAL | Status: DC | PRN

## 2017-05-01 NOTE — ED Provider Notes (Signed)
[facility] EMERGENCY DEPARTMENT Provider Note   CSN: [id number] Arrival date & time: 05/01/17  1304     History   Chief Complaint Chief Complaint  Patient presents with  . Emesis    HPI Juan Juarez is a 61 y.o. male.  HPI Pt was seen at 1315. Per pt, c/o gradual onset and persistence of multiple intermittent episodes of N/V for the past 10 days. Pt states today he ate a cheeseburger and french fries and did not vomit afterwards.  Denies any other symptoms. States he has not checked his CBG nor taken his meds for the past 10 days.  Denies abd pain, no CP/SOB, no back pain, no fevers, no black or blood in stools or emesis.    Past Medical History:  Diagnosis Date  . Anxiety   . Arthritis   . Chronic neck and back pain   . Depression   . Diabetes mellitus, type II ([place])   . Elevated LFTs   . Gout   . Hypertension   . Insomnia   . Non compliance w medication regimen   . Osteoarthritis   . Polysubstance abuse ([place])   . Protein-calorie malnutrition, severe ([place]) 09/13/2016  . Thrombocytopenia [facility])     Patient Active Problem List   Diagnosis Date Noted  . Hyperglycemia 09/13/2016  . Polysubstance abuse ([place]) 09/13/2016  . Elevated LFTs 09/13/2016  . Protein-calorie malnutrition, severe ([place]) 09/13/2016  . Depression 09/13/2016  . Thrombocytopenia ([place]) 09/13/2016  . Diabetes mellitus with hyperglycemia ([place]) 05/13/2015  . Essential hypertension, benign 05/13/2015  . Smoker 05/13/2015  . Back pain at L4-L5 level 11/05/2013  . Neck pain 11/05/2013  . Cervical spondylosis 11/05/2013  . Spinal stenosis of lumbar region 11/05/2013    History reviewed. No pertinent surgical history.     Home Medications    Prior to Admission medications   Medication Sig Start Date End Date Taking? Authorizing Provider  allopurinol (ZYLOPRIM) 100 MG tablet Take 2 tablets by mouth daily as needed. For gout flare 04/09/14   [provider]  alprazolam Duanne Moron) 2 MG  tablet Take 0.5 tablets by mouth daily as needed. 09/05/16   [provider]  canagliflozin (INVOKANA) 100 MG TABS tablet Take 1 tablet (100 mg total) by mouth daily. Patient not taking: Reported on 09/13/2016 05/13/15   Cassandria Anger, MD  doxepin (SINEQUAN) 10 MG capsule Take 10 mg by mouth at bedtime.    [provider]  lisinopril (PRINIVIL,ZESTRIL) 10 MG tablet Take 1 tablet (10 mg total) by mouth daily. Patient not taking: Reported on 09/13/2016 05/13/15   Cassandria Anger, MD  metFORMIN (GLUCOPHAGE) 1000 MG tablet Take 0.5 tablets (500 mg total) by mouth 2 (two) times daily with a meal. 05/13/15   Nida, Marella Chimes, MD    Family History Family History  Problem Relation Age of Onset  . Thyroid disease Sister     Social History Social History  Substance Use Topics  . Smoking status: Current Every Day Smoker    Packs/day: 0.50    Years: 50.00    Types: Cigarettes  . Smokeless tobacco: Never Used  . Alcohol use Yes     Comment: quit 08/2016 - "as much as I can get"     Allergies   Patient has no known allergies.   Review of Systems Review of Systems ROS: Statement: All systems negative except as marked or noted in the HPI; Constitutional: Negative for fever and chills. ; ; Eyes: Negative  for eye pain, redness and discharge. ; ; ENMT: Negative for ear pain, hoarseness, nasal congestion, sinus pressure and sore throat. ; ; Cardiovascular: Negative for chest pain, palpitations, diaphoresis, dyspnea and peripheral edema. ; ; Respiratory: Negative for cough, wheezing and stridor. ; ; Gastrointestinal: +N/V. Negative for diarrhea, abdominal pain, blood in stool, hematemesis, jaundice and rectal bleeding. . ; ; Genitourinary: Negative for dysuria, flank pain and hematuria. ; ; Musculoskeletal: Negative for back pain and neck pain. Negative for swelling and trauma.; ; Skin: Negative for pruritus, rash, abrasions, blisters, bruising and skin lesion.; ; Neuro:  Negative for headache, lightheadedness and neck stiffness. Negative for weakness, altered level of consciousness, altered mental status, extremity weakness, paresthesias, involuntary movement, seizure and syncope.       Physical Exam Updated Vital Signs BP (!) 128/91 (BP Location: Right Arm)   Pulse 72   Temp 98.4 F (36.9 C) (Oral)   Resp 18   Ht 5\' 7"  (1.702 m)   Wt 74.8 kg (165 lb)   SpO2 99%   BMI 25.84 kg/m   Physical Exam 1320: Physical examination:  Nursing notes reviewed; Vital signs and O2 SAT reviewed;  Constitutional: Thin, In no acute distress; Head:  Normocephalic, atraumatic; Eyes: EOMI, PERRL, No scleral icterus; ENMT: Mouth and pharynx normal, Mucous membranes dry; Neck: Supple, Full range of motion, No lymphadenopathy; Cardiovascular: Regular rate and rhythm, No gallop; Respiratory: Breath sounds clear & equal bilaterally, No wheezes.  Speaking full sentences with ease, Normal respiratory effort/excursion; Chest: Nontender, Movement normal; Abdomen: Soft, Nontender, Nondistended, Normal bowel sounds; Genitourinary: No CVA tenderness; Extremities: Pulses normal, No tenderness, No edema, No calf edema or asymmetry.; Neuro: AA&Ox3, Major CN grossly intact.  Speech clear. No gross focal motor or sensory deficits in extremities. Climbs on and off stretcher easily by himself. Gait steady..; Skin: Color normal, Warm, Dry.; Psych:  Affect flat.    ED Treatments / Results  Labs (all labs ordered are listed, but only abnormal results are displayed)   EKG  EKG Interpretation  Date/Time:  Wednesday May 01 2017 13:34:49 EDT Ventricular Rate:  73 PR Interval:    QRS Duration: 94 QT Interval:  396 QTC Calculation: 437 R Axis:   4 Text Interpretation:  Sinus rhythm Baseline wander When compared with ECG of 09/13/2016 No significant change was found Confirmed by Francine Graven [phone]) on 05/01/2017 1:52:43 PM       Radiology   Procedures Procedures (including  critical care time)  Medications Ordered in ED Medications  sodium chloride 0.9 % bolus 1,000 mL (not administered)     Initial Impression / Assessment and Plan / ED Course  I have reviewed the triage vital signs and the nursing notes.  Pertinent labs & imaging results that were available during my care of the patient were reviewed by me and considered in my medical decision making (see chart for details).  MDM Reviewed: nursing note, previous chart and vitals Reviewed previous: labs and ECG Interpretation: labs, ECG and x-ray Total time providing critical care: 30-74 minutes. This excludes time spent performing separately reportable procedures and services. Consults: admitting MD   CRITICAL CARE Performed by: Alfonzo Feller Total critical care time: 35 minutes Critical care time was exclusive of separately billable procedures and treating other patients. Critical care was necessary to treat or prevent imminent or life-threatening deterioration. Critical care was time spent personally by me on the following activities: development of treatment plan with patient and/or surrogate as well as nursing, discussions with consultants,  evaluation of patient's response to treatment, examination of patient, obtaining history from patient or surrogate, ordering and performing treatments and interventions, ordering and review of laboratory studies, ordering and review of radiographic studies, pulse oximetry and re-evaluation of patient's condition.  Results for orders placed or performed during the hospital encounter of 05/01/17  Comprehensive metabolic panel  Result Value Ref Range   Sodium 128 (L) 135 - 145 mmol/L   Potassium 3.7 3.5 - 5.1 mmol/L   Chloride 93 (L) 101 - 111 mmol/L   CO2 19 (L) 22 - 32 mmol/L   Glucose, Bld 186 (H) 65 - 99 mg/dL   BUN 18 6 - 20 mg/dL   Creatinine, Ser 1.05 0.61 - 1.24 mg/dL   Calcium 8.6 (L) 8.9 - 10.3 mg/dL   Total Protein 7.6 6.5 - 8.1 g/dL    Albumin 3.0 (L) 3.5 - 5.0 g/dL   AST 118 (H) 15 - 41 U/L   ALT 104 (H) 17 - 63 U/L   Alkaline Phosphatase 101 38 - 126 U/L   Total Bilirubin 3.0 (H) 0.3 - 1.2 mg/dL   GFR calc non Af Amer >60 >60 mL/min   GFR calc Af Amer >60 >60 mL/min   Anion gap 16 (H) 5 - 15  Lipase, blood  Result Value Ref Range   Lipase 42 11 - 51 U/L  CBC with Differential  Result Value Ref Range   WBC 4.9 4.0 - 10.5 K/uL   RBC 4.64 4.22 - 5.81 MIL/uL   Hemoglobin 15.7 13.0 - 17.0 g/dL   HCT 44.2 39.0 - 52.0 %   MCV 95.3 78.0 - 100.0 fL   MCH 33.8 26.0 - 34.0 pg   MCHC 35.5 30.0 - 36.0 g/dL   RDW 13.5 11.5 - 15.5 %   Platelets 58 (L) 150 - 400 K/uL   Neutrophils Relative % 58 %   Neutro Abs 2.9 1.7 - 7.7 K/uL   Lymphocytes Relative 31 %   Lymphs Abs 1.5 0.7 - 4.0 K/uL   Monocytes Relative 10 %   Monocytes Absolute 0.5 0.1 - 1.0 K/uL   Eosinophils Relative 0 %   Eosinophils Absolute 0.0 0.0 - 0.7 K/uL   Basophils Relative 1 %   Basophils Absolute 0.1 0.0 - 0.1 K/uL  Urinalysis, Routine w reflex microscopic  Result Value Ref Range   Color, Urine AMBER (A) YELLOW   APPearance CLEAR CLEAR   Specific Gravity, Urine 1.026 1.005 - 1.030   pH 5.0 5.0 - 8.0   Glucose, UA 150 (A) NEGATIVE mg/dL   Hgb urine dipstick NEGATIVE NEGATIVE   Bilirubin Urine SMALL (A) NEGATIVE   Ketones, ur 80 (A) NEGATIVE mg/dL   Protein, ur 30 (A) NEGATIVE mg/dL   Nitrite NEGATIVE NEGATIVE   Leukocytes, UA NEGATIVE NEGATIVE   RBC / HPF 0-5 0 - 5 RBC/hpf   WBC, UA 0-5 0 - 5 WBC/hpf   Bacteria, UA NONE SEEN NONE SEEN   Squamous Epithelial / LPF 0-5 (A) NONE SEEN   Mucus PRESENT    Hyaline Casts, UA PRESENT   Urine rapid drug screen (hosp performed)  Result Value Ref Range   Opiates NONE DETECTED NONE DETECTED   Cocaine POSITIVE (A) NONE DETECTED   Benzodiazepines POSITIVE (A) NONE DETECTED   Amphetamines NONE DETECTED NONE DETECTED   Tetrahydrocannabinol POSITIVE (A) NONE DETECTED   Barbiturates NONE DETECTED NONE  DETECTED  Troponin I  Result Value Ref Range   Troponin I <0.03 <0.03 ng/mL  Ethanol  Result Value Ref Range   Alcohol, Ethyl (B) <10 <10 mg/dL  CBG monitoring, ED  Result Value Ref Range   Glucose-Capillary 197 (H) 65 - 99 mg/dL   US Abdomen Complete Result Date: 05/01/2017 CLINICAL DATA:  61 year old male with vomiting for 10 days, abnormal LFTs. EXAM: ABDOMEN ULTRASOUND COMPLETE COMPARISON:  [facility] noncontrast CT Abdomen and Pelvis 10/19/2016 FINDINGS: Gallbladder: No gallstones or wall thickening visualized. No sonographic Murphy sign noted by sonographer. Common bile duct: Diameter: 4 mm , normal Liver: No focal lesion identified. Within normal limits in parenchymal echogenicity. Portal vein is patent on color Doppler imaging with normal direction of blood flow towards the liver. IVC: No abnormality visualized. Pancreas: Visualized portion unremarkable. Spleen: Enlarged, estimated splenic volume 838 mL (normal splenic volume range 83 - 412 mL). No discrete splenic lesion. Possible perisplenic varices (Image 49). Right Kidney: Length: 12.8 cm. Echogenicity within normal limits. No mass or hydronephrosis visualized. Left Kidney: Length: 11.6 cm. Multiple simple appearing left renal cysts are superimposed on splenorenal varices (image 72). Echogenicity within normal limits. No mass or hydronephrosis visualized. Abdominal aorta: No aneurysm visualized. Other findings: No free fluid. IMPRESSION: 1. 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 Patent portal vein with hepatopetal flow direction. 2. No discrete liver lesion. 3. Negative gallbladder and other visible abdominal viscera. Electronically Signed   By: Genevie Ann M.D.   On: 05/01/2017 16:16   Dg Abd Acute W/chest Result Date: 05/01/2017 CLINICAL DATA:  Nausea, vomiting. EXAM: DG ABDOMEN ACUTE W/ 1V CHEST COMPARISON:  Radiograph January 14, 2015. FINDINGS: There is no evidence of dilated  bowel loops or free intraperitoneal air. Phleboliths are noted in the pelvis. Heart size and mediastinal contours are within normal limits. Both lungs are clear. IMPRESSION: No evidence of bowel obstruction or ileus. No acute cardiopulmonary disease. Electronically Signed   By: Marijo Conception, M.D.   On: 05/01/2017 14:47    1640:  New hyponatremia on labs, glucose normal, AG elevated: likely due to dehydration. Will continue IVF, observation admit. T/C to [facility] Dr. Roderic Palau, case discussed, including:  HPI, pertinent PM/SHx, VS/PE, dx testing, ED course and treatment:  Agreeable to admit.     Final Clinical Impressions(s) / ED Diagnoses   Final diagnoses:  None    New Prescriptions New Prescriptions   No medications on file     Francine Graven, DO 05/05/17 1337

## 2017-05-01 NOTE — Progress Notes (Signed)
CT called and asked if pt could go to have CT scan performed. Pt says he doesn't think he could tolerate the oral contrast due to nausea even following zofran dose provided earlier. Pt also said he was too tired to go have scan done now and won't go until the morning. CT made aware.

## 2017-05-01 NOTE — ED Notes (Addendum)
Pt performed Fluid/PO Challenge with delayed complications of vomiting and emesis. Fluids consumed during the fluid/PO challenged were vomited into emesis bag. IV fluids continue to infuse. Pt presents with no symptoms of aspiration.

## 2017-05-01 NOTE — ED Triage Notes (Signed)
Pt comes in because he has been vomiting the last 10 days. Today he ate a cheeseburger and french fries and that stayed down. Prior to this (around 1 year ago) pt lost 50 lb and was told he had parasites. He was placed on medication and finished that 1-2 months ago. Denies any pain.

## 2017-05-01 NOTE — H&P (Signed)
History and Physical    Juan Juarez WJX:[id number] DOB: August 21, 1955 DOA: 05/01/2017  PCP: Rosita Fire, MD  Patient coming from: Home  I have personally briefly reviewed patient's old medical records in [place]  Chief Complaint: Vomiting  HPI: Juan Juarez is a 61 y.o. male with medical history significant of polysubstance abuse including alcohol, tobacco, cocaine, diabetes, presents to the hospital with complaints of vomiting.  Patient reports that approximately 10 days ago, he had a sausage egg and biscuit from [employer].  After eating this, he began having nausea and subsequently developed vomiting.  He has been unable to keep anything down for the past 10 days.  He noticed some blood in his vomit after having several episodes of vomiting.  Vomitus is also contain bile.  He has not had any fever.  He has not had any dysuria.  No diarrhea or abdominal pain.  No shortness of breath or cough.  He is feeling somewhat weak and occasionally lightheaded.  He has not taken any of his medicines in the past week.  ED Course: Vitals were noted to be stable.  Acute abdominal series did not show any significant findings.  Lab work showed a bicarb of 19, sodium of 128.  Platelets, which are chronically low, were found to be a 58.  Since the patient is having continued vomiting, he is been referred for admission.  Review of Systems: As per HPI otherwise 10 point review of systems negative.    Past Medical History:  Diagnosis Date  . Anxiety   . Arthritis   . Chronic neck and back pain   . Depression   . Diabetes mellitus, type II ([place])   . Elevated LFTs   . Gout   . Hypertension   . Insomnia   . Non compliance w medication regimen   . Osteoarthritis   . Polysubstance abuse ([place])   . Protein-calorie malnutrition, severe ([place]) 09/13/2016  . Thrombocytopenia ([place])     History reviewed. No pertinent surgical history.   reports that he has been smoking Cigarettes.  He has a 25.00  pack-year smoking history. He has never used smokeless tobacco. He reports that he drinks alcohol. He reports that he uses drugs, including Marijuana and Cocaine.  No Known Allergies  Family History  Problem Relation Age of Onset  . Thyroid disease Sister     Prior to Admission medications   Medication Sig Start Date End Date Taking? Authorizing Provider  alprazolam Duanne Moron) 2 MG tablet Take 2 mg by mouth daily as needed for sleep.  09/05/16  Yes [provider]  canagliflozin (INVOKANA) 100 MG TABS tablet Take 1 tablet (100 mg total) by mouth daily. 05/13/15  Yes Nida, Marella Chimes, MD  doxepin (SINEQUAN) 10 MG capsule Take 10 mg by mouth daily as needed (depression).    Yes [provider]  meloxicam (MOBIC) 7.5 MG tablet Take 1 tablet by mouth daily. 04/02/17  Yes [provider]  metFORMIN (GLUCOPHAGE) 1000 MG tablet Take 0.5 tablets (500 mg total) by mouth 2 (two) times daily with a meal. Patient taking differently: Take 1,000 mg by mouth 2 (two) times daily with a meal.  05/13/15  Yes Cassandria Anger, MD    Physical Exam: Vitals:   05/01/17 1311 05/01/17 1530 05/01/17 1600 05/01/17 1630  BP: (!) 128/91 115/71 113/72 121/76  Pulse: 72 67 70 73  Resp: 18 17 19 11   Temp: 98.4 F (36.9 C)     TempSrc:  Oral     SpO2: 99% 99% 99% 99%  Weight: 74.8 kg (165 lb)     Height: 5\' 7"  (1.702 m)       Constitutional: NAD, calm, comfortable Vitals:   05/01/17 1311 05/01/17 1530 05/01/17 1600 05/01/17 1630  BP: (!) 128/91 115/71 113/72 121/76  Pulse: 72 67 70 73  Resp: 18 17 19 11   Temp: 98.4 F (36.9 C)     TempSrc: Oral     SpO2: 99% 99% 99% 99%  Weight: 74.8 kg (165 lb)     Height: 5\' 7"  (1.702 m)      Eyes: PERRL, lids and conjunctivae normal ENMT: Mucous membranes are moist. Posterior pharynx clear of any exudate or lesions.Normal dentition.  Neck: normal, supple, no masses, no thyromegaly Respiratory: clear to auscultation bilaterally, no  wheezing, no crackles. Normal respiratory effort. No accessory muscle use.  Cardiovascular: Regular rate and rhythm, no murmurs / rubs / gallops. No extremity edema. 2+ pedal pulses. No carotid bruits.  Abdomen: no tenderness, no masses palpated. No hepatosplenomegaly. Bowel sounds positive.  Musculoskeletal: no clubbing / cyanosis. No joint deformity upper and lower extremities. Good ROM, no contractures. Normal muscle tone.  Skin: no rashes, lesions, ulcers. No induration Neurologic: CN 2-12 grossly intact. Sensation intact, DTR normal. Strength 5/5 in all 4.  Psychiatric: Normal judgment and insight. Alert and oriented x 3. Normal mood.   Labs on Admission: I have personally reviewed following labs and imaging studies  CBC:  Recent Labs Lab 05/01/17 1334  WBC 4.9  NEUTROABS 2.9  HGB 15.7  HCT 44.2  MCV 95.3  PLT 58*   Basic Metabolic Panel:  Recent Labs Lab 05/01/17 1334  NA 128*  K 3.7  CL 93*  CO2 19*  GLUCOSE 186*  BUN 18  CREATININE 1.05  CALCIUM 8.6*   GFR: Estimated Creatinine Clearance: 68.2 mL/min (by C-G formula based on SCr of 1.05 mg/dL). Liver Function Tests:  Recent Labs Lab 05/01/17 1334  AST 118*  ALT 104*  ALKPHOS 101  BILITOT 3.0*  PROT 7.6  ALBUMIN 3.0*    Recent Labs Lab 05/01/17 1334  LIPASE 42   No results for input(s): AMMONIA in the last 168 hours. Coagulation Profile: No results for input(s): INR, PROTIME in the last 168 hours. Cardiac Enzymes:  Recent Labs Lab 05/01/17 1334  TROPONINI <0.03   BNP (last 3 results) No results for input(s): PROBNP in the last 8760 hours. HbA1C: No results for input(s): HGBA1C in the last 72 hours. CBG:  Recent Labs Lab 05/01/17 1353 05/01/17 1751  GLUCAP 197* 178*   Lipid Profile: No results for input(s): CHOL, HDL, LDLCALC, TRIG, CHOLHDL, LDLDIRECT in the last 72 hours. Thyroid Function Tests: No results for input(s): TSH, T4TOTAL, FREET4, T3FREE, THYROIDAB in the last 72  hours. Anemia Panel: No results for input(s): VITAMINB12, FOLATE, FERRITIN, TIBC, IRON, RETICCTPCT in the last 72 hours. Urine analysis:    Component Value Date/Time   COLORURINE AMBER (A) 05/01/2017 1515   APPEARANCEUR CLEAR 05/01/2017 1515   LABSPEC 1.026 05/01/2017 1515   PHURINE 5.0 05/01/2017 1515   GLUCOSEU 150 (A) 05/01/2017 1515   HGBUR NEGATIVE 05/01/2017 1515   BILIRUBINUR SMALL (A) 05/01/2017 1515   KETONESUR 80 (A) 05/01/2017 1515   PROTEINUR 30 (A) 05/01/2017 1515   UROBILINOGEN 0.2 07/01/2014 2244   NITRITE NEGATIVE 05/01/2017 1515   LEUKOCYTESUR NEGATIVE 05/01/2017 1515    Radiological Exams on Admission: US Abdomen Complete  Result Date: 05/01/2017 CLINICAL DATA:  61 year old male with vomiting for 10 days, abnormal LFTs. EXAM: ABDOMEN ULTRASOUND COMPLETE COMPARISON:  [facility] noncontrast CT Abdomen and Pelvis 10/19/2016 FINDINGS: Gallbladder: No gallstones or wall thickening visualized. No sonographic Murphy sign noted by sonographer. Common bile duct: Diameter: 4 mm , normal Liver: No focal lesion identified. Within normal limits in parenchymal echogenicity. Portal vein is patent on color Doppler imaging with normal direction of blood flow towards the liver. IVC: No abnormality visualized. Pancreas: Visualized portion unremarkable. Spleen: Enlarged, estimated splenic volume 838 mL (normal splenic volume range 83 - 412 mL). No discrete splenic lesion. Possible perisplenic varices (Image 49). Right Kidney: Length: 12.8 cm. Echogenicity within normal limits. No mass or hydronephrosis visualized. Left Kidney: Length: 11.6 cm. Multiple simple appearing left renal cysts are superimposed on splenorenal varices (image 72). Echogenicity within normal limits. No mass or hydronephrosis visualized. Abdominal aorta: No aneurysm visualized. Other findings: No free fluid. IMPRESSION: 1. 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  Patent portal vein with hepatopetal flow direction. 2. No discrete liver lesion. 3. Negative gallbladder and other visible abdominal viscera. Electronically Signed   By: Genevie Ann M.D.   On: 05/01/2017 16:16   Dg Abd Acute W/chest  Result Date: 05/01/2017 CLINICAL DATA:  Nausea, vomiting. EXAM: DG ABDOMEN ACUTE W/ 1V CHEST COMPARISON:  Radiograph January 14, 2015. FINDINGS: There is no evidence of dilated bowel loops or free intraperitoneal air. Phleboliths are noted in the pelvis. Heart size and mediastinal contours are within normal limits. Both lungs are clear. IMPRESSION: No evidence of bowel obstruction or ileus. No acute cardiopulmonary disease. Electronically Signed   By: Marijo Conception, M.D.   On: 05/01/2017 14:47    EKG: Independently reviewed. Sinus rhythm without any acute changes  Assessment/Plan Active Problems:   Diabetes mellitus with hyperglycemia (HCC)   Thrombocytopenia (H[facility])   Vomiting   Dehydration   Hyponatremia   Cocaine abuse ([place])    1. Persistent vomiting.  Possibly gastroenteritis.  Acute abdominal series has been unrevealing.  Abdominal ultrasound does not show any significant findings.  LFTs are mildly elevated.  Will check a acute hepatitis panel.  Patient was given a trial of food in the emergency room and continued to have vomiting.  Will check CT scan abdomen pelvis to rule out any other pathologies. 2. Diabetes.  Hold metformin and Invokana.  Start on sliding scale insulin. 3. Thrombocytopenia.  Chronic.  Likely related to chronic alcohol consumption.  Continue to monitor. 4. History of alcoholism.  Patient reports that he has not had any alcohol in over a week.  Denies any history of withdrawal.  We will continue to monitor for now. 5. Hyponatremia.  Likely related to persistent vomiting and dehydration.  Start on IV fluids. 6. Cocaine abuse.  DVT prophylaxis: SCDs Code Status: Full code Family Communication: No family present Disposition Plan: Discharge  home once improved Consults called:  Admission status: Observation, Dollene Cleveland MD [facility] Pager [phone]  If 7PM-7AM, please contact night-coverage [email or web address] Password [email or web address]  05/01/2017, 6:28 PM

## 2017-05-02 ENCOUNTER — Observation Stay (HOSPITAL_COMMUNITY): Payer: Medicare Other

## 2017-05-02 DIAGNOSIS — E1165 Type 2 diabetes mellitus with hyperglycemia: Secondary | ICD-10-CM | POA: Diagnosis not present

## 2017-05-02 DIAGNOSIS — R1114 Bilious vomiting: Secondary | ICD-10-CM | POA: Diagnosis not present

## 2017-05-02 DIAGNOSIS — R110 Nausea: Secondary | ICD-10-CM | POA: Diagnosis not present

## 2017-05-02 DIAGNOSIS — E860 Dehydration: Secondary | ICD-10-CM | POA: Diagnosis not present

## 2017-05-02 DIAGNOSIS — D696 Thrombocytopenia, unspecified: Secondary | ICD-10-CM | POA: Diagnosis not present

## 2017-05-02 DIAGNOSIS — F1410 Cocaine abuse, uncomplicated: Secondary | ICD-10-CM | POA: Diagnosis not present

## 2017-05-02 DIAGNOSIS — E871 Hypo-osmolality and hyponatremia: Secondary | ICD-10-CM | POA: Diagnosis not present

## 2017-05-02 LAB — CBC
HCT: 39.5 % (ref 39.0–52.0)
HEMOGLOBIN: 13.8 g/dL (ref 13.0–17.0)
MCH: 33.9 pg (ref 26.0–34.0)
MCHC: 34.9 g/dL (ref 30.0–36.0)
MCV: 97.1 fL (ref 78.0–100.0)
Platelets: 52 10*3/uL — ABNORMAL LOW (ref 150–400)
RBC: 4.07 MIL/uL — AB (ref 4.22–5.81)
RDW: 13.5 % (ref 11.5–15.5)
WBC: 4.2 10*3/uL (ref 4.0–10.5)

## 2017-05-02 LAB — COMPREHENSIVE METABOLIC PANEL
ALK PHOS: 91 U/L (ref 38–126)
ALT: 78 U/L — ABNORMAL HIGH (ref 17–63)
ANION GAP: 6 (ref 5–15)
AST: 93 U/L — ABNORMAL HIGH (ref 15–41)
Albumin: 2.4 g/dL — ABNORMAL LOW (ref 3.5–5.0)
BILIRUBIN TOTAL: 1.5 mg/dL — AB (ref 0.3–1.2)
BUN: 11 mg/dL (ref 6–20)
CALCIUM: 7.8 mg/dL — AB (ref 8.9–10.3)
CO2: 26 mmol/L (ref 22–32)
Chloride: 102 mmol/L (ref 101–111)
Creatinine, Ser: 0.92 mg/dL (ref 0.61–1.24)
Glucose, Bld: 200 mg/dL — ABNORMAL HIGH (ref 65–99)
Potassium: 3.6 mmol/L (ref 3.5–5.1)
Sodium: 134 mmol/L — ABNORMAL LOW (ref 135–145)
TOTAL PROTEIN: 6.1 g/dL — AB (ref 6.5–8.1)

## 2017-05-02 LAB — GLUCOSE, CAPILLARY
GLUCOSE-CAPILLARY: 130 mg/dL — AB (ref 65–99)
GLUCOSE-CAPILLARY: 132 mg/dL — AB (ref 65–99)
Glucose-Capillary: 187 mg/dL — ABNORMAL HIGH (ref 65–99)
Glucose-Capillary: 173 mg/dL — ABNORMAL HIGH (ref 65–99)

## 2017-05-02 MED ORDER — IOPAMIDOL (ISOVUE-300) INJECTION 61%
INTRAVENOUS | Status: AC
  Administered 2017-05-02: 10:00:00
  Filled 2017-05-02: qty 30

## 2017-05-02 NOTE — Care Management Obs Status (Signed)
[facility] NOTIFICATION   Patient Details  Name: Juan Juarez MRN: [id number] Date of Birth: February 18, 1956   Medicare Observation Status Notification Given:  Yes    Sherald Barge, RN 05/02/2017, 1:46 PM

## 2017-05-02 NOTE — Progress Notes (Signed)
Patient requested his IVF be unhooked so he could walk in the hallways.  I instructed him that he could push the pole to ambulate but he demanded I unhook him.  I instructed him to remain on the unit for his safety.  A few minutes later the door alarm to the [facility] alarmed and I asked security to check outside for my patient and security found him outside smoking and escorted him back to his room.  When asked why he did that patient stated I have been smoking for 50 years and I just can't stop over night.  I instructed him on the fact that this was a tobacco free facility and he said fine I will just go across the street.  I informed him that he was not supposed to leave the property while in the care of hospital staff for liability reasons.  Patient was not receptive to my discussion with him and may repeat this unsafe behavior.

## 2017-05-02 NOTE — Progress Notes (Signed)
PROGRESS NOTE    ISSAAC Juarez  YWV:[id number] DOB: Dec 17, 1955 DOA: 05/01/2017 PCP: Juan Fire, MD    Brief Narrative:  61 y/o male with history of alcoholism and cocaine use, admitted to the hospital with persistent vomiting and dehydration. Labs have improved with hydration, but persistent vomiting with po intake persists. GI consult requested.   Assessment & Plan:   Active Problems:   Diabetes mellitus with hyperglycemia (HCC)   Thrombocytopenia (HCC)   Vomiting   Dehydration   Hyponatremia   Cocaine abuse ([employer])   1. Persistent vomiting. Patient has persistent vomiting with any po intake. This has been presnt for at least 10 days prior to admission.  Acute abdominal series was unrevealing.  Abdominal ultrasound and CT abdomen did not show any significant findings.  LFTs were mildly elevated, but have improved with hydration.  Acute hepatitis panel in process. Since he has been unable to tolerate any po, will consult GI. 2. Diabetes.  Hold metformin and Invokana.  Continue on sliding scale insulin. 3. Thrombocytopenia.  Chronic.  Likely related to chronic alcohol consumption.  Continue to monitor. 4. History of alcoholism.  Patient reports that he has not had any alcohol in over a week.  Denies any history of withdrawal.  We will continue to monitor for now. 5. Alcoholic cirrhosis. Changes consistent with cirrhosis noted on imaging. 6. Hyponatremia.  Likely related to persistent vomiting and dehydration.  Improving on IV fluids. 7. Cocaine abuse. Will consult social work to give patient resources for drug rehab   DVT prophylaxis: scd Code Status: full code Family Communication: no family present Disposition Plan: discharge home once improved   Consultants:     Procedures:      Antimicrobials:    Subjective: Continued vomiting after any po intake. No diarrhea or abdominal pain  Objective: Vitals:   05/01/17 2015 05/01/17 2120 05/02/17 0427 05/02/17 1300    BP:  117/69 109/71 94/61  Pulse:  75 76 70  Resp:  18 18 19   Temp:  99 F (37.2 C) 98.9 F (37.2 C) 98.2 F (36.8 C)  TempSrc:  Oral Oral Oral  SpO2: 98% 99% 97% 100%  Weight:  70.4 kg (155 lb 3.2 oz)    Height:  5\' 7"  (1.702 m)      Intake/Output Summary (Last 24 hours) at 05/02/17 1701 Last data filed at 05/02/17 1500  Gross per 24 hour  Intake           3287.5 ml  Output              750 ml  Net           2537.5 ml   Filed Weights   05/01/17 1311 05/01/17 2120  Weight: 74.8 kg (165 lb) 70.4 kg (155 lb 3.2 oz)    Examination:  General exam: Appears calm and comfortable  Respiratory system: Clear to auscultation. Respiratory effort normal. Cardiovascular system: S1 & S2 heard, RRR. No JVD, murmurs, rubs, gallops or clicks. No pedal edema. Gastrointestinal system: Abdomen is nondistended, soft and nontender. No organomegaly or masses felt. Normal bowel sounds heard. Central nervous system: Alert and oriented. No focal neurological deficits. Extremities: Symmetric 5 x 5 power. Skin: No rashes, lesions or ulcers Psychiatry: Judgement and insight appear normal. Mood & affect appropriate.     Data Reviewed: I have personally reviewed following labs and imaging studies  CBC:  Recent Labs Lab 05/01/17 1334 05/02/17 0414  WBC 4.9 4.2  NEUTROABS 2.9  --  HGB 15.7 13.8  HCT 44.2 39.5  MCV 95.3 97.1  PLT 58* 52*   Basic Metabolic Panel:  Recent Labs Lab 05/01/17 1334 05/02/17 0414  NA 128* 134*  K 3.7 3.6  CL 93* 102  CO2 19* 26  GLUCOSE 186* 200*  BUN 18 11  CREATININE 1.05 0.92  CALCIUM 8.6* 7.8*   GFR: Estimated Creatinine Clearance: 77.8 mL/min (by C-G formula based on SCr of 0.92 mg/dL). Liver Function Tests:  Recent Labs Lab 05/01/17 1334 05/02/17 0414  AST 118* 93*  ALT 104* 78*  ALKPHOS 101 91  BILITOT 3.0* 1.5*  PROT 7.6 6.1*  ALBUMIN 3.0* 2.4*    Recent Labs Lab 05/01/17 1334  LIPASE 42   No results for input(s): AMMONIA in  the last 168 hours. Coagulation Profile: No results for input(s): INR, PROTIME in the last 168 hours. Cardiac Enzymes:  Recent Labs Lab 05/01/17 1334  TROPONINI <0.03   BNP (last 3 results) No results for input(s): PROBNP in the last 8760 hours. HbA1C: No results for input(s): HGBA1C in the last 72 hours. CBG:  Recent Labs Lab 05/01/17 1751 05/01/17 2119 05/02/17 0752 05/02/17 1144 05/02/17 1640  GLUCAP 178* 178* 130* 132* 187*   Lipid Profile: No results for input(s): CHOL, HDL, LDLCALC, TRIG, CHOLHDL, LDLDIRECT in the last 72 hours. Thyroid Function Tests: No results for input(s): TSH, T4TOTAL, FREET4, T3FREE, THYROIDAB in the last 72 hours. Anemia Panel: No results for input(s): VITAMINB12, FOLATE, FERRITIN, TIBC, IRON, RETICCTPCT in the last 72 hours. Sepsis Labs: No results for input(s): PROCALCITON, LATICACIDVEN in the last 168 hours.  No results found for this or any previous visit (from the past 240 hour(s)).       Radiology Studies: Ct Abdomen Pelvis Wo Contrast  Result Date: 05/02/2017 CLINICAL DATA:  Nausea, bilious vomiting x 10 days after eating a biscuit. History of DM, HTN. EXAM: CT ABDOMEN AND PELVIS WITHOUT CONTRAST TECHNIQUE: Multidetector CT imaging of the abdomen and pelvis was performed following the standard protocol without IV contrast. COMPARISON:  Ultrasound, 05/01/2017.  Abdomen pelvis CT, 10/19/2016. FINDINGS: Lower chest: No acute abnormality. Hepatobiliary: Liver normal in overall size and attenuation. There is mild central volume loss with relative enlargement of the lateral segment left lobe. Subtle surface nodularity is suggested. These findings are similar to the prior exam, raising the possibility of cirrhosis. No liver mass or focal lesion. Gallbladder is unremarkable. No bile duct dilation. Pancreas: Unremarkable. No pancreatic ductal dilatation or surrounding inflammatory changes. Spleen: Spleen mildly enlarged measuring 14.7 x 5.3 x  14.1 cm. No splenic mass or focal lesion. Adrenals/Urinary Tract: Normal right adrenal gland. Left adrenal gland appears enlarged by multiple nodules. The apparent nodules may reflect varices compressing the left adrenal gland. This appearance is stable. Two left lower pole renal cysts. The more superior measures 2.3 cm and the more inferior measures 3.3 cm. Small calcification noted along the anterior wall of the larger cyst. These are stable. No other renal masses. No convincing collecting system stones and no hydronephrosis. Ureters are normal course and in caliber. Bladder is unremarkable. Stomach/Bowel: Stomach is unremarkable. No bowel dilation or wall thickening. No adjacent inflammation. Normal appendix visualized. Vascular/Lymphatic: Multiple venous collaterals noted the upper abdomen. Collaterals extending from the spleen to the left renal vein. Collaterals are noted along the lesser curvature of the stomach. These collaterals or evident on the recent ultrasound. There are also stable from the prior CT. Aortic atherosclerotic calcifications.  No aneurysm. No enlarged lymph nodes.  Reproductive: Prostate is mildly enlarged. Other: No ascites.  No abdominal wall hernia. Musculoskeletal: No fracture or acute finding. No osteoblastic or osteolytic lesions. IMPRESSION: 1. No acute findings within the abdomen or pelvis. No evidence of bowel obstruction or inflammation. 2. Splenomegaly with venous collaterals as noted on the recent ultrasound are consistent with portal venous hypertension. There are liver morphologic changes raising the possibility of cirrhosis. 3. Two stable left renal cysts. 4. Aortic atherosclerosis. Electronically Signed   By: Lajean Manes M.D.   On: 05/02/2017 15:02   US Abdomen Complete  Result Date: 05/01/2017 CLINICAL DATA:  61 year old male with vomiting for 10 days, abnormal LFTs. EXAM: ABDOMEN ULTRASOUND COMPLETE COMPARISON:  [facility] noncontrast CT Abdomen and  Pelvis 10/19/2016 FINDINGS: Gallbladder: No gallstones or wall thickening visualized. No sonographic Murphy sign noted by sonographer. Common bile duct: Diameter: 4 mm , normal Liver: No focal lesion identified. Within normal limits in parenchymal echogenicity. Portal vein is patent on color Doppler imaging with normal direction of blood flow towards the liver. IVC: No abnormality visualized. Pancreas: Visualized portion unremarkable. Spleen: Enlarged, estimated splenic volume 838 mL (normal splenic volume range 83 - 412 mL). No discrete splenic lesion. Possible perisplenic varices (Image 49). Right Kidney: Length: 12.8 cm. Echogenicity within normal limits. No mass or hydronephrosis visualized. Left Kidney: Length: 11.6 cm. Multiple simple appearing left renal cysts are superimposed on splenorenal varices (image 72). Echogenicity within normal limits. No mass or hydronephrosis visualized. Abdominal aorta: No aneurysm visualized. Other findings: No free fluid. IMPRESSION: 1. 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 Patent portal vein with hepatopetal flow direction. 2. No discrete liver lesion. 3. Negative gallbladder and other visible abdominal viscera. Electronically Signed   By: Genevie Ann M.D.   On: 05/01/2017 16:16   Dg Abd Acute W/chest  Result Date: 05/01/2017 CLINICAL DATA:  Nausea, vomiting. EXAM: DG ABDOMEN ACUTE W/ 1V CHEST COMPARISON:  Radiograph January 14, 2015. FINDINGS: There is no evidence of dilated bowel loops or free intraperitoneal air. Phleboliths are noted in the pelvis. Heart size and mediastinal contours are within normal limits. Both lungs are clear. IMPRESSION: No evidence of bowel obstruction or ileus. No acute cardiopulmonary disease. Electronically Signed   By: Marijo Conception, M.D.   On: 05/01/2017 14:47        Scheduled Meds: . insulin aspart  0-15 Units Subcutaneous TID WC  . insulin aspart  0-5 Units Subcutaneous QHS  .  iopamidol       Continuous Infusions: . 0.9 % NaCl with KCl 20 mEq / L 125 mL/hr at 05/02/17 0519     LOS: 0 days    Time spent: 55mins    Juan Juarez,JEHANZEB, MD [facility] Pager [phone]  If 7PM-7AM, please contact night-coverage [email or web address] Password [email or web address] 05/02/2017, 5:01 PM

## 2017-05-03 ENCOUNTER — Encounter (HOSPITAL_COMMUNITY): Payer: Self-pay | Admitting: Gastroenterology

## 2017-05-03 DIAGNOSIS — D696 Thrombocytopenia, unspecified: Secondary | ICD-10-CM | POA: Diagnosis not present

## 2017-05-03 DIAGNOSIS — E1165 Type 2 diabetes mellitus with hyperglycemia: Secondary | ICD-10-CM

## 2017-05-03 DIAGNOSIS — E8809 Other disorders of plasma-protein metabolism, not elsewhere classified: Secondary | ICD-10-CM

## 2017-05-03 DIAGNOSIS — B1920 Unspecified viral hepatitis C without hepatic coma: Secondary | ICD-10-CM

## 2017-05-03 DIAGNOSIS — B182 Chronic viral hepatitis C: Secondary | ICD-10-CM | POA: Diagnosis not present

## 2017-05-03 DIAGNOSIS — E871 Hypo-osmolality and hyponatremia: Secondary | ICD-10-CM

## 2017-05-03 DIAGNOSIS — F1410 Cocaine abuse, uncomplicated: Secondary | ICD-10-CM | POA: Diagnosis not present

## 2017-05-03 DIAGNOSIS — R161 Splenomegaly, not elsewhere classified: Secondary | ICD-10-CM | POA: Diagnosis not present

## 2017-05-03 DIAGNOSIS — E860 Dehydration: Secondary | ICD-10-CM

## 2017-05-03 DIAGNOSIS — E46 Unspecified protein-calorie malnutrition: Secondary | ICD-10-CM

## 2017-05-03 DIAGNOSIS — R112 Nausea with vomiting, unspecified: Secondary | ICD-10-CM

## 2017-05-03 LAB — GLUCOSE, CAPILLARY
GLUCOSE-CAPILLARY: 128 mg/dL — AB (ref 65–99)
GLUCOSE-CAPILLARY: 200 mg/dL — AB (ref 65–99)
GLUCOSE-CAPILLARY: 96 mg/dL (ref 65–99)

## 2017-05-03 LAB — CBC WITH DIFFERENTIAL/PLATELET
BASOS ABS: 0.1 10*3/uL (ref 0.0–0.1)
Basophils Relative: 2 %
EOS ABS: 0 10*3/uL (ref 0.0–0.7)
EOS PCT: 0 %
HCT: 36.3 % — ABNORMAL LOW (ref 39.0–52.0)
HEMOGLOBIN: 12.7 g/dL — AB (ref 13.0–17.0)
LYMPHS PCT: 46 %
Lymphs Abs: 1.9 10*3/uL (ref 0.7–4.0)
MCH: 33.5 pg (ref 26.0–34.0)
MCHC: 35 g/dL (ref 30.0–36.0)
MCV: 95.8 fL (ref 78.0–100.0)
Monocytes Absolute: 0.3 10*3/uL (ref 0.1–1.0)
Monocytes Relative: 8 %
NEUTROS PCT: 44 %
Neutro Abs: 1.8 10*3/uL (ref 1.7–7.7)
PLATELETS: 48 10*3/uL — AB (ref 150–400)
RBC: 3.79 MIL/uL — AB (ref 4.22–5.81)
RDW: 13.5 % (ref 11.5–15.5)
WBC: 4.2 10*3/uL (ref 4.0–10.5)

## 2017-05-03 LAB — COMPREHENSIVE METABOLIC PANEL
ALT: 79 U/L — AB (ref 17–63)
ANION GAP: 6 (ref 5–15)
AST: 100 U/L — ABNORMAL HIGH (ref 15–41)
Albumin: 2.4 g/dL — ABNORMAL LOW (ref 3.5–5.0)
Alkaline Phosphatase: 82 U/L (ref 38–126)
BUN: 7 mg/dL (ref 6–20)
CHLORIDE: 102 mmol/L (ref 101–111)
CO2: 25 mmol/L (ref 22–32)
CREATININE: 0.83 mg/dL (ref 0.61–1.24)
Calcium: 7.9 mg/dL — ABNORMAL LOW (ref 8.9–10.3)
Glucose, Bld: 149 mg/dL — ABNORMAL HIGH (ref 65–99)
POTASSIUM: 3.8 mmol/L (ref 3.5–5.1)
SODIUM: 133 mmol/L — AB (ref 135–145)
Total Bilirubin: 1.7 mg/dL — ABNORMAL HIGH (ref 0.3–1.2)
Total Protein: 5.7 g/dL — ABNORMAL LOW (ref 6.5–8.1)

## 2017-05-03 LAB — HEPATITIS PANEL, ACUTE
HEP A IGM: NEGATIVE
HEP B C IGM: NEGATIVE
HEP B S AG: NEGATIVE

## 2017-05-03 MED ORDER — ONDANSETRON HCL 4 MG/2ML IJ SOLN
4.0000 mg | Freq: Three times a day (TID) | INTRAMUSCULAR | Status: DC
  Administered 2017-05-03 (×2): 4 mg via INTRAVENOUS
  Filled 2017-05-03 (×2): qty 2

## 2017-05-03 MED ORDER — ONDANSETRON 8 MG PO TBDP: 8.0000 mg | ORAL_TABLET | Freq: Three times a day (TID) | ORAL | 0 refills | Status: DC | PRN

## 2017-05-03 MED ORDER — PANTOPRAZOLE SODIUM 40 MG IV SOLR
40.0000 mg | Freq: Two times a day (BID) | INTRAVENOUS | Status: DC
  Administered 2017-05-03 (×2): 40 mg via INTRAVENOUS
  Filled 2017-05-03 (×2): qty 40

## 2017-05-03 MED ORDER — PANTOPRAZOLE SODIUM 40 MG PO TBEC: 40.0000 mg | DELAYED_RELEASE_TABLET | Freq: Two times a day (BID) | ORAL | 1 refills | Status: DC

## 2017-05-03 NOTE — Discharge Summary (Signed)
Physician Discharge Summary  Juan Juarez YCX:[id number] DOB: 11/27/1955 DOA: 05/01/2017  PCP: Rosita Fire, MD  Admit date: 05/01/2017 Discharge date: 05/03/2017  Admitted From: Home Disposition: Home  Recommendations for Outpatient Follow-up:  1. Follow up with PCP in 1-2 weeks 2. Please obtain BMP/CBC in one week 3. Follow-up with GI as an outpatient for further treatment/workup of hep C cirrhosis   Home Health: Equipment/Devices:  Discharge Condition: Stable CODE STATUS full code Diet recommendation: Heart Healthy   Brief/Interim Summary: 61 y/o male with history of alcoholism and cocaine use, admitted to the hospital with persistent vomiting and dehydration. Labs have improved with hydration, but persistent vomiting with po intake persists. GI consult requested.  Discharge Diagnoses:  Active Problems:   Diabetes mellitus with hyperglycemia (HCC)   Thrombocytopenia (HCC)   Vomiting   Dehydration   Hyponatremia   Cocaine abuse (HCC)   Nausea and vomiting in adult   Hepatitis C virus infection without hepatic coma   Splenomegaly   Hypoalbuminemia  1. Persistent vomiting. Patient complained of persistent vomiting with any po intake. This had been presnt for at least 10 days prior to admission. Acute abdominal series was unrevealing. Abdominal ultrasound and CT abdomen did not show any significant findings. LFTs are mildly elevated. Acute hepatitis panel positive for hepatitis C antibody.  GI was consulted and change the patient's diet to full liquid and started on PPI.  On further discussion with nursing staff, it was noted that none of the staff members and actually seen the patient vomit after eating.  The patient was told that nursing staff would need to document vomiting and therefore he should inform the staff every time he vomits so that this could be documented.  Once patient was informed of this request, no further vomiting was reported.  He was tolerating solid  diet prior to discharge.  He will be continued on PPI as well as antiemetics as needed.  I suspect that he may have a gastritis from alcohol use.  With his ongoing cocaine use, he would not be a candidate for EGD at this time unless he was actively bleeding. 2. Diabetes. Restart metformin and Invokana on discharge. 3. Thrombocytopenia. Chronic. Likely related to chronic alcohol consumption/cirrhosis.  4. History of alcoholism. Patient reports that he has not had any alcohol in over a week. Denies any history of withdrawal. No signs of withdrawal during his hospitalization. 5. Alcoholic cirrhosis. Changes consistent with cirrhosis noted on imaging.  He has also tested positive for hepatitis C.  He will need further follow-up with gastroenterology. 6. Hyponatremia. Likely related to persistent vomiting and dehydration. Improving on IV fluids. 7. Cocaine abuse.  Social work consulted to give patient resources for drug rehab  Discharge Instructions  Discharge Instructions    Diet - low sodium heart healthy    Complete by:  As directed    Increase activity slowly    Complete by:  As directed      Allergies as of 05/03/2017   No Known Allergies     Medication List    STOP taking these medications   meloxicam 7.5 MG tablet Commonly known as:  MOBIC     TAKE these medications   alprazolam 2 MG tablet Commonly known as:  XANAX Take 2 mg by mouth daily as needed for sleep.   canagliflozin 100 MG Tabs tablet Commonly known as:  INVOKANA Take 1 tablet (100 mg total) by mouth daily.   doxepin 10 MG capsule Commonly known as:  SINEQUAN Take 10 mg by mouth daily as needed (depression).   metFORMIN 1000 MG tablet Commonly known as:  GLUCOPHAGE Take 0.5 tablets (500 mg total) by mouth 2 (two) times daily with a meal. What changed:  how much to take   ondansetron 8 MG disintegrating tablet Commonly known as:  ZOFRAN ODT Take 1 tablet (8 mg total) by mouth every 8 (eight) hours as  needed for nausea or vomiting.   pantoprazole 40 MG tablet Commonly known as:  PROTONIX Take 1 tablet (40 mg total) by mouth 2 (two) times daily before a meal.       No Known Allergies  Consultations:  Gastroenterology   Procedures/Studies: Ct Abdomen Pelvis Wo Contrast  Result Date: 05/02/2017 CLINICAL DATA:  Nausea, bilious vomiting x 10 days after eating a biscuit. History of DM, HTN. EXAM: CT ABDOMEN AND PELVIS WITHOUT CONTRAST TECHNIQUE: Multidetector CT imaging of the abdomen and pelvis was performed following the standard protocol without IV contrast. COMPARISON:  Ultrasound, 05/01/2017.  Abdomen pelvis CT, 10/19/2016. FINDINGS: Lower chest: No acute abnormality. Hepatobiliary: Liver normal in overall size and attenuation. There is mild central volume loss with relative enlargement of the lateral segment left lobe. Subtle surface nodularity is suggested. These findings are similar to the prior exam, raising the possibility of cirrhosis. No liver mass or focal lesion. Gallbladder is unremarkable. No bile duct dilation. Pancreas: Unremarkable. No pancreatic ductal dilatation or surrounding inflammatory changes. Spleen: Spleen mildly enlarged measuring 14.7 x 5.3 x 14.1 cm. No splenic mass or focal lesion. Adrenals/Urinary Tract: Normal right adrenal gland. Left adrenal gland appears enlarged by multiple nodules. The apparent nodules may reflect varices compressing the left adrenal gland. This appearance is stable. Two left lower pole renal cysts. The more superior measures 2.3 cm and the more inferior measures 3.3 cm. Small calcification noted along the anterior wall of the larger cyst. These are stable. No other renal masses. No convincing collecting system stones and no hydronephrosis. Ureters are normal course and in caliber. Bladder is unremarkable. Stomach/Bowel: Stomach is unremarkable. No bowel dilation or wall thickening. No adjacent inflammation. Normal appendix visualized.  Vascular/Lymphatic: Multiple venous collaterals noted the upper abdomen. Collaterals extending from the spleen to the left renal vein. Collaterals are noted along the lesser curvature of the stomach. These collaterals or evident on the recent ultrasound. There are also stable from the prior CT. Aortic atherosclerotic calcifications.  No aneurysm. No enlarged lymph nodes. Reproductive: Prostate is mildly enlarged. Other: No ascites.  No abdominal wall hernia. Musculoskeletal: No fracture or acute finding. No osteoblastic or osteolytic lesions. IMPRESSION: 1. No acute findings within the abdomen or pelvis. No evidence of bowel obstruction or inflammation. 2. Splenomegaly with venous collaterals as noted on the recent ultrasound are consistent with portal venous hypertension. There are liver morphologic changes raising the possibility of cirrhosis. 3. Two stable left renal cysts. 4. Aortic atherosclerosis. Electronically Signed   By: Lajean Manes M.D.   On: 05/02/2017 15:02   US Abdomen Complete  Result Date: 05/01/2017 CLINICAL DATA:  61 year old male with vomiting for 10 days, abnormal LFTs. EXAM: ABDOMEN ULTRASOUND COMPLETE COMPARISON:  [facility] noncontrast CT Abdomen and Pelvis 10/19/2016 FINDINGS: Gallbladder: No gallstones or wall thickening visualized. No sonographic Murphy sign noted by sonographer. Common bile duct: Diameter: 4 mm , normal Liver: No focal lesion identified. Within normal limits in parenchymal echogenicity. Portal vein is patent on color Doppler imaging with normal direction of blood flow towards the liver. IVC: No abnormality  visualized. Pancreas: Visualized portion unremarkable. Spleen: Enlarged, estimated splenic volume 838 mL (normal splenic volume range 83 - 412 mL). No discrete splenic lesion. Possible perisplenic varices (Image 49). Right Kidney: Length: 12.8 cm. Echogenicity within normal limits. No mass or hydronephrosis visualized. Left Kidney: Length: 11.6 cm.  Multiple simple appearing left renal cysts are superimposed on splenorenal varices (image 72). Echogenicity within normal limits. No mass or hydronephrosis visualized. Abdominal aorta: No aneurysm visualized. Other findings: No free fluid. IMPRESSION: 1. 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 Patent portal vein with hepatopetal flow direction. 2. No discrete liver lesion. 3. Negative gallbladder and other visible abdominal viscera. Electronically Signed   By: Genevie Ann M.D.   On: 05/01/2017 16:16   Dg Abd Acute W/chest  Result Date: 05/01/2017 CLINICAL DATA:  Nausea, vomiting. EXAM: DG ABDOMEN ACUTE W/ 1V CHEST COMPARISON:  Radiograph January 14, 2015. FINDINGS: There is no evidence of dilated bowel loops or free intraperitoneal air. Phleboliths are noted in the pelvis. Heart size and mediastinal contours are within normal limits. Both lungs are clear. IMPRESSION: No evidence of bowel obstruction or ileus. No acute cardiopulmonary disease. Electronically Signed   By: Marijo Conception, M.D.   On: 05/01/2017 14:47       Subjective: Tolerating solid food.  No further vomiting today.  Discharge Exam: Vitals:   05/03/17 0553 05/03/17 1300  BP: 109/71 110/62  Pulse: 78 80  Resp: 18 19  Temp: 99.6 F (37.6 C) 98.5 F (36.9 C)  SpO2: 94% 97%   Vitals:   05/02/17 1942 05/02/17 1950 05/03/17 0553 05/03/17 1300  BP:  (!) 90/56 109/71 110/62  Pulse:  79 78 80  Resp:  18 18 19   Temp:  (!) 100.5 F (38.1 C) 99.6 F (37.6 C) 98.5 F (36.9 C)  TempSrc:  Oral Oral Oral  SpO2: 95% 98% 94% 97%  Weight:      Height:        General: Pt is alert, awake, not in acute distress Cardiovascular: RRR, S1/S2 +, no rubs, no gallops Respiratory: CTA bilaterally, no wheezing, no rhonchi Abdominal: Soft, NT, ND, bowel sounds + Extremities: no edema, no cyanosis    The results of significant diagnostics from this hospitalization (including imaging,  microbiology, ancillary and laboratory) are listed below for reference.     Microbiology: No results found for this or any previous visit (from the past 240 hour(s)).   Labs: BNP (last 3 results) No results for input(s): BNP in the last 8760 hours. Basic Metabolic Panel:  Recent Labs Lab 05/01/17 1334 05/02/17 0414 05/03/17 0505  NA 128* 134* 133*  K 3.7 3.6 3.8  CL 93* 102 102  CO2 19* 26 25  GLUCOSE 186* 200* 149*  BUN 18 11 7   CREATININE 1.05 0.92 0.83  CALCIUM 8.6* 7.8* 7.9*   Liver Function Tests:  Recent Labs Lab 05/01/17 1334 05/02/17 0414 05/03/17 0505  AST 118* 93* 100*  ALT 104* 78* 79*  ALKPHOS 101 91 82  BILITOT 3.0* 1.5* 1.7*  PROT 7.6 6.1* 5.7*  ALBUMIN 3.0* 2.4* 2.4*    Recent Labs Lab 05/01/17 1334  LIPASE 42   No results for input(s): AMMONIA in the last 168 hours. CBC:  Recent Labs Lab 05/01/17 1334 05/02/17 0414 05/03/17 0949  WBC 4.9 4.2 4.2  NEUTROABS 2.9  --  1.8  HGB 15.7 13.8 12.7*  HCT 44.2 39.5 36.3*  MCV 95.3 97.1 95.8  PLT 58* 52* 48*  Cardiac Enzymes:  Recent Labs Lab 05/01/17 1334  TROPONINI <0.03   BNP: Invalid input(s): POCBNP CBG:  Recent Labs Lab 05/02/17 1640 05/02/17 1954 05/03/17 0735 05/03/17 1139 05/03/17 1654  GLUCAP 187* 173* 128* 200* 96   D-Dimer No results for input(s): DDIMER in the last 72 hours. Hgb A1c No results for input(s): HGBA1C in the last 72 hours. Lipid Profile No results for input(s): CHOL, HDL, LDLCALC, TRIG, CHOLHDL, LDLDIRECT in the last 72 hours. Thyroid function studies No results for input(s): TSH, T4TOTAL, T3FREE, THYROIDAB in the last 72 hours.  Invalid input(s): FREET3 Anemia work up No results for input(s): VITAMINB12, FOLATE, FERRITIN, TIBC, IRON, RETICCTPCT in the last 72 hours. Urinalysis    Component Value Date/Time   COLORURINE AMBER (A) 05/01/2017 1515   APPEARANCEUR CLEAR 05/01/2017 1515   LABSPEC 1.026 05/01/2017 1515   PHURINE 5.0 05/01/2017  1515   GLUCOSEU 150 (A) 05/01/2017 1515   HGBUR NEGATIVE 05/01/2017 1515   BILIRUBINUR SMALL (A) 05/01/2017 1515   KETONESUR 80 (A) 05/01/2017 1515   PROTEINUR 30 (A) 05/01/2017 1515   UROBILINOGEN 0.2 07/01/2014 2244   NITRITE NEGATIVE 05/01/2017 1515   LEUKOCYTESUR NEGATIVE 05/01/2017 1515   Sepsis Labs Invalid input(s): PROCALCITONIN,  WBC,  LACTICIDVEN Microbiology No results found for this or any previous visit (from the past 240 hour(s)).   Time coordinating discharge: Over 30 minutes  SIGNED:   Kathie Dike, MD  [facility] 05/03/2017, 7:29 PM Pager   If 7PM-7AM, please contact night-coverage [email or web address] Password [email or web address]

## 2017-05-03 NOTE — Consult Note (Signed)
Referring Provider: [facility] Primary Care Physician:  Rosita Fire, MD Primary Gastroenterologist:  Dr. Oneida Alar (previously unassigned)  Date of Admission: 05/01/2017 Date of Consultation: 05/03/2017  Reason for Consultation:  Persistent vomiting, hematemesis  HPI:  Juan Juarez is a 61 y.o. male with a past medical history of anxiety, chronic pain, depression, DM, elevated LFTs, medical non-compliance (alcohol, tobacco, cocaine), polysubstance abuse, and thrombocytopenia.  He presented to the emergency department 05/01/2017 with complaints of persistent vomiting that started 10 days prior after eating a sausage and egg biscuit from [employer].  When he stated he was unable to keep down any food or fluids for 10 days.  He also noted some hematemesis after several episodes of vomiting.  No fever, diarrhea, abdominal pain.  He was admitted with probable gastroenteritis and acute abdominal series was unremarkable, abdominal ultrasound with sequela of portal venous hypertension including splenomegaly and perisplenic varices, otherwise unremarkable.  LFTs were mildly elevated and a acute hepatitis panel was checked.  He was rehydrated and his labs improved related to dehydration, but he continued to have persistent vomiting.  On admission he had elevated LFTs including AST/ALT at 118/104, bilirubin 3.0.  This improved with hydration and today he remains mildly elevated with AST/ALT of 100/79, bilirubin 1.7, normal alkaline phosphatase of 82.  When reviewing his historical labs he does have intermittent LFT elevations as far back as our system tracts to 2015.  He has also had low albumin ranging from 2.4-3.4 and at same time period.  Lipase was normal on admission.  All normal on admission.  Acute hepatitis panel did come back positive hepatitis C antibody greater than 11.  Reviewing his CBCs his platelets are persistently depressed and are 52 today.  They have ranged from the 50s to the 80s  since 2015.  Today he states he is not having any abdominal pain.  He has had persistent nausea and vomiting for about 10 days since having a biscuit from [employer].  He is still unable to keep down food or fluids.  When vomiting at home he had a couple episodes of hematemesis, but also notes his last 4-5 emesis episodes have had blood in them as well.  Denies diarrhea or fever.  He was a heavy drinker, consuming up to a case and a half a day but recently has cut back.  He has not had any alcohol in 2 weeks.  He has a history of polysubstance abuse, admits injection cocaine use remotely.  He states he was recently told he was hepatitis C negative, about 1 year ago.  Denies any other GI symptoms at this time.  Past Medical History:  Diagnosis Date  . Anxiety   . Arthritis   . Chronic neck and back pain   . Depression   . Diabetes mellitus, type II ([place])   . Elevated LFTs   . Gout   . Hypertension   . Insomnia   . Non compliance w medication regimen   . Osteoarthritis   . Polysubstance abuse ([place])   . Protein-calorie malnutrition, severe ([place]) 09/13/2016  . Thrombocytopenia ([place])     History reviewed. No pertinent surgical history.  Prior to Admission medications   Medication Sig Start Date End Date Taking? Authorizing Provider  alprazolam Duanne Moron) 2 MG tablet Take 2 mg by mouth daily as needed for sleep.  09/05/16  Yes [provider]  canagliflozin (INVOKANA) 100 MG TABS tablet Take 1 tablet (100 mg total) by mouth daily. 05/13/15  Yes Nida,  Marella Chimes, MD  doxepin (SINEQUAN) 10 MG capsule Take 10 mg by mouth daily as needed (depression).    Yes [provider]  meloxicam (MOBIC) 7.5 MG tablet Take 1 tablet by mouth daily. 04/02/17  Yes [provider]  metFORMIN (GLUCOPHAGE) 1000 MG tablet Take 0.5 tablets (500 mg total) by mouth 2 (two) times daily with a meal. Patient taking differently: Take 1,000 mg by mouth 2 (two) times daily with a meal.  05/13/15   Yes Nida, Marella Chimes, MD    Current Facility-Administered Medications  Medication Dose Route Frequency Provider Last Rate Last Dose  . 0.9 % NaCl with KCl 20 mEq/ L  infusion   Intravenous Continuous Kathie Dike, MD 125 mL/hr at 05/03/17 0058    . acetaminophen (TYLENOL) tablet 650 mg  650 mg Oral Q6H PRN Kathie Dike, MD       Or  . acetaminophen (TYLENOL) suppository 650 mg  650 mg Rectal Q6H PRN Kathie Dike, MD      . ALPRAZolam Duanne Moron) tablet 2 mg  2 mg Oral Daily PRN Kathie Dike, MD   2 mg at 05/02/17 2105  . insulin aspart (novoLOG) injection 0-15 Units  0-15 Units Subcutaneous TID WC Kathie Dike, MD   3 Units at 05/02/17 1729  . insulin aspart (novoLOG) injection 0-5 Units  0-5 Units Subcutaneous QHS Kathie Dike, MD      . ondansetron (ZOFRAN) tablet 4 mg  4 mg Oral Q6H PRN Kathie Dike, MD       Or  . ondansetron (ZOFRAN) injection 4 mg  4 mg Intravenous Q6H PRN Kathie Dike, MD   4 mg at 05/02/17 1203    Allergies as of 05/01/2017  . (No Known Allergies)    Family History  Problem Relation Age of Onset  . Thyroid disease Sister     Social History   Social History  . Marital status: Divorced    Spouse name: N/A  . Number of children: N/A  . Years of education: N/A   Occupational History  . disabled    Social History Main Topics  . Smoking status: Current Every Day Smoker    Packs/day: 0.50    Years: 50.00    Types: Cigarettes  . Smokeless tobacco: Never Used  . Alcohol use Yes     Comment: quit 08/2016 - "as much as I can get"  . Drug use: Yes    Types: Marijuana, Cocaine  . Sexual activity: Not on file   Other Topics Concern  . Not on file   Social History Narrative  . No narrative on file    Review of Systems: General: Negative for anorexia, weight loss, fever, chills, fatigue, weakness. Eyes: Negative for vision changes.  ENT: Negative for hoarseness, difficulty swallowing , nasal congestion. CV: Negative for  chest pain, angina, palpitations, dyspnea on exertion, peripheral edema.  Respiratory: Negative for dyspnea at rest, dyspnea on exertion, cough, sputum, wheezing.  GI: See history of present illness. GU:  Negative for dysuria, hematuria, urinary incontinence, urinary frequency, nocturnal urination.  MS: Negative for joint pain, low back pain.  Derm: Negative for rash or itching.  Neuro: Negative for weakness, abnormal sensation, seizure, frequent headaches, memory loss, confusion.  Psych: Negative for anxiety, depression, suicidal ideation, hallucinations.  Endo: Negative for unusual weight change.  Heme: Negative for bruising or bleeding. Allergy: Negative for rash or hives.  Physical Exam: Vital signs in last 24 hours: Temp:  [98.2 F (36.8 C)-100.5 F (38.1 C)]  99.6 F (37.6 C) (11/02 0553) Pulse Rate:  [70-79] 78 (11/02 0553) Resp:  [18-19] 18 (11/02 0553) BP: (90-109)/(56-71) 109/71 (11/02 0553) SpO2:  [94 %-100 %] 94 % (11/02 0553) Last BM Date: 05/01/17 General:   Alert,  Well-developed, well-nourished, pleasant and cooperative in NAD Head:  Normocephalic and atraumatic. Eyes:  Sclera clear, no icterus.   Conjunctiva pink. Ears:  Normal auditory acuity. Nose:  No deformity, discharge,  or lesions. Mouth:  No deformity or lesions, dentition normal. Neck:  Supple; no masses or thyromegaly. Lungs:  Clear throughout to auscultation.   No wheezes, crackles, or rhonchi. No acute distress. Heart:  Regular rate and rhythm; no murmurs, clicks, rubs,  or gallops. Abdomen:  Soft, nontender and nondistended. No masses, hepatosplenomegaly or hernias noted. Normal bowel sounds, without guarding, and without rebound.   Rectal:  Deferred until time of colonoscopy.   Msk:  Symmetrical without gross deformities. Normal posture. Pulses:  Normal pulses noted. Extremities:  Without clubbing or edema. Neurologic:  Alert and  oriented x4;  grossly normal neurologically. Skin:  Intact without  significant lesions or rashes. Cervical Nodes:  No significant cervical adenopathy. Psych:  Alert and cooperative. Normal mood and affect.  Intake/Output from previous day: 11/01 0701 - 11/02 0700 In: 3200 [P.O.:1700; I.V.:1500] Out: 1050 [Urine:1050] Intake/Output this shift: No intake/output data recorded.  Lab Results:  Recent Labs  05/01/17 1334 05/02/17 0414  WBC 4.9 4.2  HGB 15.7 13.8  HCT 44.2 39.5  PLT 58* 52*   BMET  Recent Labs  05/01/17 1334 05/02/17 0414 05/03/17 0505  NA 128* 134* 133*  K 3.7 3.6 3.8  CL 93* 102 102  CO2 19* 26 25  GLUCOSE 186* 200* 149*  BUN _0 CREATININE 1.05 0.92 0.83  CALCIUM 8.6* 7.8* 7.9*   LFT  Recent Labs  05/01/17 1334 05/02/17 0414 05/03/17 0505  PROT 7.6 6.1* 5.7*  ALBUMIN 3.0* 2.4* 2.4*  AST 118* 93* 100*  ALT 104* 78* 79*  ALKPHOS 101 91 82  BILITOT 3.0* 1.5* 1.7*   PT/INR No results for input(s): LABPROT, INR in the last 72 hours. Hepatitis Panel  Recent Labs  05/01/17 1334  HEPBSAG Negative  HCVAB >11.0*  HEPAIGM Negative  HEPBIGM Negative   C-Diff No results for input(s): CDIFFTOX in the last 72 hours.  Studies/Results: Ct Abdomen Pelvis Wo Contrast  Result Date: 05/02/2017 CLINICAL DATA:  Nausea, bilious vomiting x 10 days after eating a biscuit. History of DM, HTN. EXAM: CT ABDOMEN AND PELVIS WITHOUT CONTRAST TECHNIQUE: Multidetector CT imaging of the abdomen and pelvis was performed following the standard protocol without IV contrast. COMPARISON:  Ultrasound, 05/01/2017.  Abdomen pelvis CT, 10/19/2016. FINDINGS: Lower chest: No acute abnormality. Hepatobiliary: Liver normal in overall size and attenuation. There is mild central volume loss with relative enlargement of the lateral segment left lobe. Subtle surface nodularity is suggested. These findings are similar to the prior exam, raising the possibility of cirrhosis. No liver mass or focal lesion. Gallbladder is unremarkable. No bile duct  dilation. Pancreas: Unremarkable. No pancreatic ductal dilatation or surrounding inflammatory changes. Spleen: Spleen mildly enlarged measuring 14.7 x 5.3 x 14.1 cm. No splenic mass or focal lesion. Adrenals/Urinary Tract: Normal right adrenal gland. Left adrenal gland appears enlarged by multiple nodules. The apparent nodules may reflect varices compressing the left adrenal gland. This appearance is stable. Two left lower pole renal cysts. The more superior measures 2.3 cm and the more inferior measures 3.3 cm. Small  calcification noted along the anterior wall of the larger cyst. These are stable. No other renal masses. No convincing collecting system stones and no hydronephrosis. Ureters are normal course and in caliber. Bladder is unremarkable. Stomach/Bowel: Stomach is unremarkable. No bowel dilation or wall thickening. No adjacent inflammation. Normal appendix visualized. Vascular/Lymphatic: Multiple venous collaterals noted the upper abdomen. Collaterals extending from the spleen to the left renal vein. Collaterals are noted along the lesser curvature of the stomach. These collaterals or evident on the recent ultrasound. There are also stable from the prior CT. Aortic atherosclerotic calcifications.  No aneurysm. No enlarged lymph nodes. Reproductive: Prostate is mildly enlarged. Other: No ascites.  No abdominal wall hernia. Musculoskeletal: No fracture or acute finding. No osteoblastic or osteolytic lesions. IMPRESSION: 1. No acute findings within the abdomen or pelvis. No evidence of bowel obstruction or inflammation. 2. Splenomegaly with venous collaterals as noted on the recent ultrasound are consistent with portal venous hypertension. There are liver morphologic changes raising the possibility of cirrhosis. 3. Two stable left renal cysts. 4. Aortic atherosclerosis. Electronically Signed   By: Lajean Manes M.D.   On: 05/02/2017 15:02   US Abdomen Complete  Result Date: 05/01/2017 CLINICAL DATA:   61 year old male with vomiting for 10 days, abnormal LFTs. EXAM: ABDOMEN ULTRASOUND COMPLETE COMPARISON:  [facility] noncontrast CT Abdomen and Pelvis 10/19/2016 FINDINGS: Gallbladder: No gallstones or wall thickening visualized. No sonographic Murphy sign noted by sonographer. Common bile duct: Diameter: 4 mm , normal Liver: No focal lesion identified. Within normal limits in parenchymal echogenicity. Portal vein is patent on color Doppler imaging with normal direction of blood flow towards the liver. IVC: No abnormality visualized. Pancreas: Visualized portion unremarkable. Spleen: Enlarged, estimated splenic volume 838 mL (normal splenic volume range 83 - 412 mL). No discrete splenic lesion. Possible perisplenic varices (Image 49). Right Kidney: Length: 12.8 cm. Echogenicity within normal limits. No mass or hydronephrosis visualized. Left Kidney: Length: 11.6 cm. Multiple simple appearing left renal cysts are superimposed on splenorenal varices (image 72). Echogenicity within normal limits. No mass or hydronephrosis visualized. Abdominal aorta: No aneurysm visualized. Other findings: No free fluid. IMPRESSION: 1. 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 Patent portal vein with hepatopetal flow direction. 2. No discrete liver lesion. 3. Negative gallbladder and other visible abdominal viscera. Electronically Signed   By: Genevie Ann M.D.   On: 05/01/2017 16:16   Dg Abd Acute W/chest  Result Date: 05/01/2017 CLINICAL DATA:  Nausea, vomiting. EXAM: DG ABDOMEN ACUTE W/ 1V CHEST COMPARISON:  Radiograph January 14, 2015. FINDINGS: There is no evidence of dilated bowel loops or free intraperitoneal air. Phleboliths are noted in the pelvis. Heart size and mediastinal contours are within normal limits. Both lungs are clear. IMPRESSION: No evidence of bowel obstruction or ileus. No acute cardiopulmonary disease. Electronically Signed   By: Marijo Conception, M.D.   On:  05/01/2017 14:47    Impression: 61 year old male patient with likely undiagnosed cirrhosis given his laboratory findings and imaging findings.  He has hepatitis C positive.  He was previously a heavy drinker.  He began having nausea and vomiting about 10 days ago.  He is on a full diet currently.  There are multiple possible etiologies for his symptoms including gastritis, esophagitis, gastric outlet obstruction, gastroenteritis, sequela of liver disease.  Hematemesis possibly due to gastric congestion/portal gastropathy, gastritis.  Also possible Mallory-Weiss tear.  Doubt variceal bleed given the small amount of bleeding that has occurred.  Plan:  1. Downgrade his diet 2. Begin Protonix. 3. CBC today for any anemia 4. Drink alcohol avoidance 5. Quit using drugs 6. Will eventually need outpatient evaluation for possible hepatitis C treatment 7. We will continue to assess over the weekend for any acute changes 8. We will evaluate him on Monday and if he has persistent symptoms despite adequate PPI, full liquid diet then we can consider upper endoscopy on propofol/MAC given polysubstance abuse 9. Supportive measures 10. Monitor H&H   Thank you for allowing [place] to participate in the care of Stoy, DNP, AGNP-C Adult & Gerontological Nurse Practitioner [facility]    LOS: 0 days     05/03/2017, 8:21 AM

## 2017-05-03 NOTE — Progress Notes (Signed)
Patient discharging home. Waiting on ride. IV removed and site intact. Patient discharged with personal belongings and prescriptions.

## 2017-05-03 NOTE — Progress Notes (Signed)
PROGRESS NOTE    Juan Juarez  BMW:[id number] DOB: 10-Jul-1955 DOA: 05/01/2017 PCP: Rosita Fire, MD    Brief Narrative:  61 y/o male with history of alcoholism and cocaine use, admitted to the hospital with persistent vomiting and dehydration. Labs have improved with hydration, but persistent vomiting with po intake persists. GI consult requested.   Assessment & Plan:   Active Problems:   Diabetes mellitus with hyperglycemia (HCC)   Thrombocytopenia (HCC)   Vomiting   Dehydration   Hyponatremia   Cocaine abuse (HCC)   Nausea and vomiting in adult   Hepatitis C virus infection without hepatic coma   Splenomegaly   Hypoalbuminemia   1. Persistent vomiting. Patient has persistent vomiting with any po intake. This has been presnt for at least 10 days prior to admission.  Acute abdominal series was unrevealing.  Abdominal ultrasound and CT abdomen did not show any significant findings.  LFTs are mildly elevated. Acute hepatitis panel positive for hepatitis C antibody. Since he has been having persistent vomiting after any p.o. intake, GI has been consulted.  Diet has been downgraded to full liquids.  Started on Protonix. 2. Diabetes.  Hold metformin and Invokana.  Continue on sliding scale insulin. 3. Thrombocytopenia.  Chronic.  Likely related to chronic alcohol consumption/cirrhosis.  Continue to monitor. 4. History of alcoholism.  Patient reports that he has not had any alcohol in over a week.  Denies any history of withdrawal.  We will continue to monitor for now. 5. Alcoholic cirrhosis. Changes consistent with cirrhosis noted on imaging.  He is also tested positive for hepatitis C.  He will need further follow-up with gastroenterology. 6. Hyponatremia.  Likely related to persistent vomiting and dehydration.  Improving on IV fluids. 7. Cocaine abuse. Will consult social work to give patient resources for drug rehab   DVT prophylaxis: scd Code Status: full code Family  Communication: no family present Disposition Plan: discharge home once improved   Consultants:   Gastroenterology  Procedures:      Antimicrobials:    Subjective: Reports continued vomiting after any p.o. intake.  No abdominal pain.  No diarrhea.  Objective: Vitals:   05/02/17 1942 05/02/17 1950 05/03/17 0553 05/03/17 1300  BP:  (!) 90/56 109/71 110/62  Pulse:  79 78 80  Resp:  18 18 19   Temp:  (!) 100.5 F (38.1 C) 99.6 F (37.6 C) 98.5 F (36.9 C)  TempSrc:  Oral Oral Oral  SpO2: 95% 98% 94% 97%  Weight:      Height:        Intake/Output Summary (Last 24 hours) at 05/03/17 1545 Last data filed at 05/03/17 1300  Gross per 24 hour  Intake             1580 ml  Output             1100 ml  Net              480 ml   Filed Weights   05/01/17 1311 05/01/17 2120  Weight: 74.8 kg (165 lb) 70.4 kg (155 lb 3.2 oz)    Examination:  General exam: Appears calm and comfortable  Respiratory system: Clear to auscultation. Respiratory effort normal. Cardiovascular system: S1 & S2 heard, RRR. No JVD, murmurs, rubs, gallops or clicks. No pedal edema. Gastrointestinal system: Abdomen is nondistended, soft and nontender. No organomegaly or masses felt. Normal bowel sounds heard. Central nervous system: Alert and oriented. No focal neurological deficits. Extremities: Symmetric 5 x 5  power. Skin: No rashes, lesions or ulcers Psychiatry: Judgement and insight appear normal. Mood & affect appropriate.     Data Reviewed: I have personally reviewed following labs and imaging studies  CBC:  Recent Labs Lab 05/01/17 1334 05/02/17 0414 05/03/17 0949  WBC 4.9 4.2 4.2  NEUTROABS 2.9  --  1.8  HGB 15.7 13.8 12.7*  HCT 44.2 39.5 36.3*  MCV 95.3 97.1 95.8  PLT 58* 52* 48*   Basic Metabolic Panel:  Recent Labs Lab 05/01/17 1334 05/02/17 0414 05/03/17 0505  NA 128* 134* 133*  K 3.7 3.6 3.8  CL 93* 102 102  CO2 19* 26 25  GLUCOSE 186* 200* 149*  BUN 18 11 7     CREATININE 1.05 0.92 0.83  CALCIUM 8.6* 7.8* 7.9*   GFR: Estimated Creatinine Clearance: 86.3 mL/min (by C-G formula based on SCr of 0.83 mg/dL). Liver Function Tests:  Recent Labs Lab 05/01/17 1334 05/02/17 0414 05/03/17 0505  AST 118* 93* 100*  ALT 104* 78* 79*  ALKPHOS 101 91 82  BILITOT 3.0* 1.5* 1.7*  PROT 7.6 6.1* 5.7*  ALBUMIN 3.0* 2.4* 2.4*    Recent Labs Lab 05/01/17 1334  LIPASE 42   No results for input(s): AMMONIA in the last 168 hours. Coagulation Profile: No results for input(s): INR, PROTIME in the last 168 hours. Cardiac Enzymes:  Recent Labs Lab 05/01/17 1334  TROPONINI <0.03   BNP (last 3 results) No results for input(s): PROBNP in the last 8760 hours. HbA1C: No results for input(s): HGBA1C in the last 72 hours. CBG:  Recent Labs Lab 05/02/17 1144 05/02/17 1640 05/02/17 1954 05/03/17 0735 05/03/17 1139  GLUCAP 132* 187* 173* 128* 200*   Lipid Profile: No results for input(s): CHOL, HDL, LDLCALC, TRIG, CHOLHDL, LDLDIRECT in the last 72 hours. Thyroid Function Tests: No results for input(s): TSH, T4TOTAL, FREET4, T3FREE, THYROIDAB in the last 72 hours. Anemia Panel: No results for input(s): VITAMINB12, FOLATE, FERRITIN, TIBC, IRON, RETICCTPCT in the last 72 hours. Sepsis Labs: No results for input(s): PROCALCITON, LATICACIDVEN in the last 168 hours.  No results found for this or any previous visit (from the past 240 hour(s)).       Radiology Studies: Ct Abdomen Pelvis Wo Contrast  Result Date: 05/02/2017 CLINICAL DATA:  Nausea, bilious vomiting x 10 days after eating a biscuit. History of DM, HTN. EXAM: CT ABDOMEN AND PELVIS WITHOUT CONTRAST TECHNIQUE: Multidetector CT imaging of the abdomen and pelvis was performed following the standard protocol without IV contrast. COMPARISON:  Ultrasound, 05/01/2017.  Abdomen pelvis CT, 10/19/2016. FINDINGS: Lower chest: No acute abnormality. Hepatobiliary: Liver normal in overall size and  attenuation. There is mild central volume loss with relative enlargement of the lateral segment left lobe. Subtle surface nodularity is suggested. These findings are similar to the prior exam, raising the possibility of cirrhosis. No liver mass or focal lesion. Gallbladder is unremarkable. No bile duct dilation. Pancreas: Unremarkable. No pancreatic ductal dilatation or surrounding inflammatory changes. Spleen: Spleen mildly enlarged measuring 14.7 x 5.3 x 14.1 cm. No splenic mass or focal lesion. Adrenals/Urinary Tract: Normal right adrenal gland. Left adrenal gland appears enlarged by multiple nodules. The apparent nodules may reflect varices compressing the left adrenal gland. This appearance is stable. Two left lower pole renal cysts. The more superior measures 2.3 cm and the more inferior measures 3.3 cm. Small calcification noted along the anterior wall of the larger cyst. These are stable. No other renal masses. No convincing collecting system stones and no hydronephrosis.  Ureters are normal course and in caliber. Bladder is unremarkable. Stomach/Bowel: Stomach is unremarkable. No bowel dilation or wall thickening. No adjacent inflammation. Normal appendix visualized. Vascular/Lymphatic: Multiple venous collaterals noted the upper abdomen. Collaterals extending from the spleen to the left renal vein. Collaterals are noted along the lesser curvature of the stomach. These collaterals or evident on the recent ultrasound. There are also stable from the prior CT. Aortic atherosclerotic calcifications.  No aneurysm. No enlarged lymph nodes. Reproductive: Prostate is mildly enlarged. Other: No ascites.  No abdominal wall hernia. Musculoskeletal: No fracture or acute finding. No osteoblastic or osteolytic lesions. IMPRESSION: 1. No acute findings within the abdomen or pelvis. No evidence of bowel obstruction or inflammation. 2. Splenomegaly with venous collaterals as noted on the recent ultrasound are consistent with  portal venous hypertension. There are liver morphologic changes raising the possibility of cirrhosis. 3. Two stable left renal cysts. 4. Aortic atherosclerosis. Electronically Signed   By: Lajean Manes M.D.   On: 05/02/2017 15:02   US Abdomen Complete  Result Date: 05/01/2017 CLINICAL DATA:  61 year old male with vomiting for 10 days, abnormal LFTs. EXAM: ABDOMEN ULTRASOUND COMPLETE COMPARISON:  [facility] noncontrast CT Abdomen and Pelvis 10/19/2016 FINDINGS: Gallbladder: No gallstones or wall thickening visualized. No sonographic Murphy sign noted by sonographer. Common bile duct: Diameter: 4 mm , normal Liver: No focal lesion identified. Within normal limits in parenchymal echogenicity. Portal vein is patent on color Doppler imaging with normal direction of blood flow towards the liver. IVC: No abnormality visualized. Pancreas: Visualized portion unremarkable. Spleen: Enlarged, estimated splenic volume 838 mL (normal splenic volume range 83 - 412 mL). No discrete splenic lesion. Possible perisplenic varices (Image 49). Right Kidney: Length: 12.8 cm. Echogenicity within normal limits. No mass or hydronephrosis visualized. Left Kidney: Length: 11.6 cm. Multiple simple appearing left renal cysts are superimposed on splenorenal varices (image 72). Echogenicity within normal limits. No mass or hydronephrosis visualized. Abdominal aorta: No aneurysm visualized. Other findings: No free fluid. IMPRESSION: 1. 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 Patent portal vein with hepatopetal flow direction. 2. No discrete liver lesion. 3. Negative gallbladder and other visible abdominal viscera. Electronically Signed   By: Genevie Ann M.D.   On: 05/01/2017 16:16        Scheduled Meds: . insulin aspart  0-15 Units Subcutaneous TID WC  . insulin aspart  0-5 Units Subcutaneous QHS  . ondansetron (ZOFRAN) IV  4 mg Intravenous TID WC & HS  . pantoprazole  (PROTONIX) IV  40 mg Intravenous BID AC   Continuous Infusions: . 0.9 % NaCl with KCl 20 mEq / L 50 mL/hr at 05/03/17 1135     LOS: 0 days    Time spent: 68mins    Gaynel Schaafsma, MD [facility] Pager [phone]  If 7PM-7AM, please contact night-coverage [email or web address] Password [facility] 05/03/2017, 3:45 PM

## 2017-05-07 LAB — HEPATITIS C GENOTYPE

## 2017-05-07 LAB — HCV RNA QUANT RFLX ULTRA OR GENOTYP
HCV RNA QNT(LOG COPY/ML): 6.393 {Log_IU}/mL
HepC Qn: 2470000 IU/mL

## 2017-05-20 ENCOUNTER — Encounter: Payer: Self-pay | Admitting: Nurse Practitioner

## 2017-05-20 NOTE — Progress Notes (Signed)
PATIENT SCHEDULED  °

## 2017-05-20 NOTE — Progress Notes (Signed)
PT is aware and OK to schedule the OV appt. Forwarding to [place].

## 2017-07-09 DIAGNOSIS — F411 Generalized anxiety disorder: Secondary | ICD-10-CM | POA: Diagnosis not present

## 2017-07-09 DIAGNOSIS — F331 Major depressive disorder, recurrent, moderate: Secondary | ICD-10-CM | POA: Diagnosis not present

## 2017-07-16 ENCOUNTER — Ambulatory Visit: Payer: Medicare Other | Admitting: Nurse Practitioner

## 2017-07-22 ENCOUNTER — Ambulatory Visit: Payer: Medicare Other | Admitting: Gastroenterology

## 2017-07-27 ENCOUNTER — Emergency Department (HOSPITAL_COMMUNITY)
Admission: EM | Admit: 2017-07-27 | Discharge: 2017-07-27 | Disposition: A | Discharge status: Home / Self Care | Payer: Medicare Other | Attending: Emergency Medicine | Admitting: Emergency Medicine

## 2017-07-27 ENCOUNTER — Other Ambulatory Visit: Payer: Self-pay

## 2017-07-27 ENCOUNTER — Encounter (HOSPITAL_COMMUNITY): Payer: Self-pay | Admitting: Emergency Medicine

## 2017-07-27 DIAGNOSIS — I10 Essential (primary) hypertension: Secondary | ICD-10-CM | POA: Insufficient documentation

## 2017-07-27 DIAGNOSIS — J3489 Other specified disorders of nose and nasal sinuses: Secondary | ICD-10-CM | POA: Diagnosis not present

## 2017-07-27 DIAGNOSIS — Z7984 Long term (current) use of oral hypoglycemic drugs: Secondary | ICD-10-CM | POA: Diagnosis not present

## 2017-07-27 DIAGNOSIS — E119 Type 2 diabetes mellitus without complications: Secondary | ICD-10-CM | POA: Insufficient documentation

## 2017-07-27 DIAGNOSIS — J0110 Acute frontal sinusitis, unspecified: Secondary | ICD-10-CM

## 2017-07-27 DIAGNOSIS — Z79899 Other long term (current) drug therapy: Secondary | ICD-10-CM | POA: Insufficient documentation

## 2017-07-27 DIAGNOSIS — R0982 Postnasal drip: Secondary | ICD-10-CM | POA: Diagnosis not present

## 2017-07-27 DIAGNOSIS — F17210 Nicotine dependence, cigarettes, uncomplicated: Secondary | ICD-10-CM | POA: Diagnosis not present

## 2017-07-27 DIAGNOSIS — R0981 Nasal congestion: Secondary | ICD-10-CM | POA: Diagnosis not present

## 2017-07-27 MED ORDER — AMOXICILLIN-POT CLAVULANATE 875-125 MG PO TABS
1.0000 | ORAL_TABLET | Freq: Once | ORAL | Status: AC
  Administered 2017-07-27: 1 via ORAL
  Filled 2017-07-27: qty 1

## 2017-07-27 MED ORDER — AMOXICILLIN-POT CLAVULANATE 875-125 MG PO TABS: 1.0000 | ORAL_TABLET | Freq: Two times a day (BID) | ORAL | 0 refills | Status: DC

## 2017-07-27 MED ORDER — PREDNISONE 10 MG PO TABS: ORAL_TABLET | ORAL | 0 refills | Status: DC

## 2017-07-27 NOTE — ED Notes (Signed)
Pt up for DC  meds are ordered  Awaiting DC instructions

## 2017-07-27 NOTE — ED Triage Notes (Signed)
Pt reports sinus pressure and drainage for a month, but has turned green.

## 2017-07-27 NOTE — ED Provider Notes (Signed)
[facility] EMERGENCY DEPARTMENT Provider Note   CSN: [id number] Arrival date & time: 07/27/17  1509     History   Chief Complaint Chief Complaint  Patient presents with  . Nasal Congestion    HPI Juan Juarez is a 62 y.o. male presenting with a one-month history of upper respiratory infections which he suspects has developed into a sinus infection.  He has had nasal congestion with clear rhinorrhea which has turned to a thick green nasal discharge over the past week.  He also endorses pain and pressure in his right frontal sinus, reports copious postnasal drip along with purulent productive sputum with coughing as well.  He denies wheezing, shortness of breath or chest pain and denies any fevers.  He has had no medications prior to arrival for his symptoms.  He had an appointment with his primary doctor for this problem, unfortunately missed it.  He denies dizziness, ear pain, neck pain.  HPI  Past Medical History:  Diagnosis Date  . Anxiety   . Depression   . Diabetes mellitus, type II ([place])   . Gout   . Hepatitis C without hepatic coma   . Hypertension   . Insomnia   . Non compliance w medication regimen   . Osteoarthritis   . Polysubstance abuse ([place])   . Protein-calorie malnutrition, severe ([place]) 09/13/2016  . Thrombocytopenia [employer])     Patient Active Problem List   Diagnosis Date Noted  . Nausea and vomiting in adult   . Hepatitis C virus infection without hepatic coma   . Splenomegaly   . Hypoalbuminemia   . Vomiting 05/01/2017  . Dehydration 05/01/2017  . Hyponatremia 05/01/2017  . Cocaine abuse ([place]) 05/01/2017  . Hyperglycemia 09/13/2016  . Polysubstance abuse ([place]) 09/13/2016  . Elevated LFTs 09/13/2016  . Protein-calorie malnutrition, severe ([place]) 09/13/2016  . Depression 09/13/2016  . Thrombocytopenia ([place]) 09/13/2016  . Diabetes mellitus with hyperglycemia ([place]) 05/13/2015  . Essential hypertension, benign 05/13/2015  . Smoker 05/13/2015  .  Back pain at L4-L5 level 11/05/2013  . Neck pain 11/05/2013  . Cervical spondylosis 11/05/2013  . Spinal stenosis of lumbar region 11/05/2013    History reviewed. No pertinent surgical history.     Home Medications    Prior to Admission medications   Medication Sig Start Date End Date Taking? Authorizing Provider  alprazolam Duanne Moron) 2 MG tablet Take 2 mg by mouth daily as needed for sleep.  09/05/16   [provider]  amoxicillin-clavulanate (AUGMENTIN) 875-125 MG tablet Take 1 tablet by mouth every 12 (twelve) hours. 07/27/17   [name], PA-C  canagliflozin (INVOKANA) 100 MG TABS tablet Take 1 tablet (100 mg total) by mouth daily. 05/13/15   Cassandria Anger, MD  doxepin (SINEQUAN) 10 MG capsule Take 10 mg by mouth daily as needed (depression).     [provider]  metFORMIN (GLUCOPHAGE) 1000 MG tablet Take 0.5 tablets (500 mg total) by mouth 2 (two) times daily with a meal. Patient taking differently: Take 1,000 mg by mouth 2 (two) times daily with a meal.  05/13/15   Nida, Marella Chimes, MD  ondansetron (ZOFRAN ODT) 8 MG disintegrating tablet Take 1 tablet (8 mg total) by mouth every 8 (eight) hours as needed for nausea or vomiting. 05/03/17   Kathie Dike, MD  pantoprazole (PROTONIX) 40 MG tablet Take 1 tablet (40 mg total) by mouth 2 (two) times daily before a meal. 05/03/17 05/03/18  Kathie Dike, MD    Family History Family  History  Problem Relation Age of Onset  . Thyroid disease Sister     Social History Social History   Tobacco Use  . Smoking status: Current Every Day Smoker    Packs/day: 0.50    Years: 50.00    Pack years: 25.00    Types: Cigarettes  . Smokeless tobacco: Never Used  Substance Use Topics  . Alcohol use: Yes    Comment: quit 08/2016 - "as much as I can get"  . Drug use: Yes    Types: Marijuana, Cocaine     Allergies   Patient has no known allergies.   Review of Systems Review of Systems  Constitutional:  Negative for chills and fever.  HENT: Positive for congestion, postnasal drip, rhinorrhea and sinus pain. Negative for ear pain, hearing loss, sore throat, trouble swallowing and voice change.   Eyes: Negative for discharge.  Respiratory: Positive for cough. Negative for chest tightness, shortness of breath, wheezing and stridor.   Cardiovascular: Negative for chest pain.  Gastrointestinal: Negative for abdominal pain.  Genitourinary: Negative.      Physical Exam Updated Vital Signs BP (!) 138/99 (BP Location: Right Arm)   Pulse 73   Temp (!) 97.4 F (36.3 C) (Oral)   Resp 12   Ht 5\' 7"  (1.702 m)   Wt 70.3 kg (155 lb)   SpO2 100%   BMI 24.28 kg/m   Physical Exam  Constitutional: He is oriented to person, place, and time. He appears well-developed and well-nourished.  HENT:  Head: Normocephalic and atraumatic.  Right Ear: Tympanic membrane and ear canal normal.  Left Ear: Tympanic membrane and ear canal normal.  Nose: Mucosal edema and rhinorrhea present. Right sinus exhibits frontal sinus tenderness.  Mouth/Throat: Uvula is midline, oropharynx is clear and moist and mucous membranes are normal. No oropharyngeal exudate, posterior oropharyngeal edema, posterior oropharyngeal erythema or tonsillar abscesses.  Eyes: Conjunctivae are normal.  Cardiovascular: Normal rate and normal heart sounds.  Pulmonary/Chest: Effort normal. No respiratory distress. He has no wheezes. He has no rales.  Musculoskeletal: Normal range of motion.  Neurological: He is alert and oriented to person, place, and time.  Skin: Skin is warm and dry. No rash noted.  Psychiatric: He has a normal mood and affect.     ED Treatments / Results  Labs (all labs ordered are listed, but only abnormal results are displayed) Labs Reviewed - No data to display  EKG  EKG Interpretation None       Radiology No results found.  Procedures Procedures (including critical care time)  Medications Ordered in  ED Medications  amoxicillin-clavulanate (AUGMENTIN) 875-125 MG per tablet 1 tablet (not administered)     Initial Impression / Assessment and Plan / ED Course  I have reviewed the triage vital signs and the nursing notes.  Pertinent labs & imaging results that were available during my care of the patient were reviewed by me and considered in my medical decision making (see chart for details).    Pt with exam c/w acute sinusitis.  Denies dizziness, vision changes, ear pain.  Pain localizes to right frontal sinus.  Augmentin, warm compresses, discussed home tx for decongestant purposes, decongestant not prescribed given past medical hx and initial elevated bp here.  Plan f/u with his pcp this week if sx persist.   Final Clinical Impressions(s) / ED Diagnoses   Final diagnoses:  Acute frontal sinusitis, recurrence not specified    ED Discharge Orders        Ordered  amoxicillin-clavulanate (AUGMENTIN) 875-125 MG tablet  Every 12 hours,   Status:  Discontinued     07/27/17 1634    predniSONE (DELTASONE) 10 MG tablet  Status:  Discontinued     07/27/17 1634    amoxicillin-clavulanate (AUGMENTIN) 875-125 MG tablet  Every 12 hours     07/27/17 1636       [name], PA-C 07/27/17 1649    Mesner, Corene Cornea, MD 07/27/17 1739

## 2017-07-27 NOTE — Discharge Instructions (Signed)
Take the entire course of antibiotics.  You may try vicks vapor rub (or an inhaler stick ) which can also help with nasal congestion. Warm steam can also help with nasal congestion (sit in a steamy bathroom).

## 2017-07-27 NOTE — ED Notes (Signed)
Pt reports sinus problems for 1 month  Worse today   Here for eval

## 2017-07-29 ENCOUNTER — Other Ambulatory Visit: Payer: Self-pay | Admitting: Gastroenterology

## 2017-07-29 ENCOUNTER — Encounter: Payer: Self-pay | Admitting: Gastroenterology

## 2017-07-29 ENCOUNTER — Ambulatory Visit (INDEPENDENT_AMBULATORY_CARE_PROVIDER_SITE_OTHER): Payer: Medicare Other | Admitting: Gastroenterology

## 2017-07-29 VITALS — BP 123/77 | HR 67 | Temp 97.0°F | Ht 67.0 in | Wt 161.6 lb

## 2017-07-29 DIAGNOSIS — B182 Chronic viral hepatitis C: Secondary | ICD-10-CM

## 2017-07-29 DIAGNOSIS — Z79899 Other long term (current) drug therapy: Secondary | ICD-10-CM | POA: Diagnosis not present

## 2017-07-29 NOTE — Progress Notes (Signed)
Referring Provider: Rosita Fire, MD Primary Care Physician:  Rosita Fire, MD Primary GI: Dr. Oneida Alar   Chief Complaint  Patient presents with  . Hepatitis C    HPI:   Juan Juarez is a 62 y.o. male presenting today in hospital follow-up, seen Nov 2018 with hematemesis. Found to have positive Hep C antibody, positive RNA with genotype 1a. History of chronic ETOH abuse. History of cocaine use as well. Imaging reveals changes of cirrhosis, portal venous hypertension.   Just found out about Hep C diagnosis recently. No further hematemesis. No confusion or abdominal pain. No jaundice. No rectal bleeding. No prior colonoscopy. Quit drinking ETOH about 2 months ago. Believes last use of cocaine was about a month ago. Doesn't want to utilize AA or NA. Feels he can do this on his own. He is declining colonoscopy/EGD.   HIV negative, Hep B surface antigen negative. Needs further labs  Past Medical History:  Diagnosis Date  . Anxiety   . Depression   . Diabetes mellitus, type II ([place])   . Gout   . Hepatitis C without hepatic coma   . Hypertension   . Insomnia   . Non compliance w medication regimen   . Osteoarthritis   . Polysubstance abuse ([place])   . Protein-calorie malnutrition, severe ([place]) 09/13/2016  . Thrombocytopenia ([place])     Past Surgical History:  Procedure Laterality Date  . None      Current Outpatient Medications  Medication Sig Dispense Refill  . alprazolam (XANAX) 2 MG tablet Take 2 mg by mouth daily as needed for sleep.   2  . amoxicillin-clavulanate (AUGMENTIN) 875-125 MG tablet Take 1 tablet by mouth every 12 (twelve) hours. 14 tablet 0  . doxepin (SINEQUAN) 10 MG capsule Take 10 mg by mouth daily as needed (depression).     . meloxicam (MOBIC) 7.5 MG tablet Take 7.5 mg by mouth as needed for pain.    . metFORMIN (GLUCOPHAGE) 1000 MG tablet Take 0.5 tablets (500 mg total) by mouth 2 (two) times daily with a meal. (Patient taking differently: Take 1,000  mg by mouth 2 (two) times daily with a meal. ) 60 tablet 2   No current facility-administered medications for this visit.     Allergies as of 07/29/2017  . (No Known Allergies)    Family History  Problem Relation Age of Onset  . Thyroid disease Sister   . Colon cancer Neg Hx   . Colon polyps Neg Hx     Social History   Socioeconomic History  . Marital status: Divorced    Spouse name: None  . Number of children: None  . Years of education: None  . Highest education level: None  Social Needs  . Financial resource strain: None  . Food insecurity - worry: None  . Food insecurity - inability: None  . Transportation needs - medical: None  . Transportation needs - non-medical: None  Occupational History  . Occupation: disabled  Tobacco Use  . Smoking status: Current Every Day Smoker    Packs/day: 0.50    Years: 50.00    Pack years: 25.00    Types: Cigarettes  . Smokeless tobacco: Never Used  Substance and Sexual Activity  . Alcohol use: Yes    Frequency: Never    Comment: quit alcohol Nov 2018   . Drug use: No    Comment: 07/29/17-states he quit  . Sexual activity: None  Other Topics Concern  . None  Social History  Narrative  . None    Review of Systems: Gen: Denies fever, chills, anorexia. Denies fatigue, weakness, weight loss.  CV: Denies chest pain, palpitations, syncope, peripheral edema, and claudication. Resp: Denies dyspnea at rest, cough, wheezing, coughing up blood, and pleurisy. GI: see HPI  Derm: Denies rash, itching, dry skin Psych: Denies depression, anxiety, memory loss, confusion. No homicidal or suicidal ideation.  Heme: Denies bruising, bleeding, and enlarged lymph nodes.  Physical Exam: BP 123/77   Pulse 67   Temp (!) 97 F (36.1 C) (Oral)   Ht 5\' 7"  (1.702 m)   Wt 161 lb 9.6 oz (73.3 kg)   BMI 25.31 kg/m  General:   Alert and oriented. No distress noted. Pleasant and cooperative.  Head:  Normocephalic and atraumatic. Eyes:  Conjuctiva  clear without scleral icterus. Mouth:  Oral mucosa pink and moist.  Abdomen:  +BS, soft, non-tender and non-distended. No rebound or guarding. No HSM or masses noted. Msk:  Symmetrical without gross deformities. Normal posture. Extremities:  Without edema. Neurologic:  Alert and  oriented x4 Psych:  Alert and cooperative. Normal mood and affect.

## 2017-07-29 NOTE — Patient Instructions (Addendum)
Please have blood work done today. I have also ordered a drug screen.   Continue to avoid alcohol indefinitely and all forms of illegal substances.   I recommend an upper endoscopy and colonoscopy as we talked about, so just let us know if you would like to pursue this in the future.  You will have another ultrasound in May 2019.   We will see what blood work looks like and then decide on treatment plan.   Hepatitis C Hepatitis C is a liver infection. It is caused by a germ that can spread through blood and other bodily fluids. Your doctor will use blood and liver tests to:  Check for this infection.  Decide how to treat you.  Check your health after treatment.  Follow these instructions at home:  Rest.  Do not take any medicine unless your doctor says it is okay. This includes over-the-counter medicine and birth control pills.  Do not drink alcohol.  Do not have sex until your doctor says it is okay.  Do not share toothbrushes, nail clippers, razors, or needles.  Take all medicines as told by your doctor. Contact a doctor if:  You have a fever.  Your belly (abdomen) hurts.  Your pee (urine) is dark.  Your poop (bowel movement) is the color of clay.  You have joint pain. Get help right away if:  You feel more and more tired (fatigued).  You feel more and more weak.  You do not feel like eating.  You feel sick to your stomach (nauseous) or throw up (vomit).  Your skin or the whites of your eyes turn yellow (jaundice) or turn more yellow than they were before.  You bruise or bleed easily. This information is not intended to replace advice given to you by your health care provider. Make sure you discuss any questions you have with your health care provider. Document Released: 05/31/2008 Document Revised: 11/24/2015 Document Reviewed: 09/30/2013 Elsevier Interactive Patient Education  2017 Elsevier Inc.  Cirrhosis Cirrhosis is long-term (chronic) liver  injury. The liver is your largest internal organ, and it performs many functions. The liver converts food into energy, removes toxic material from your blood, makes important proteins, and absorbs necessary vitamins from your diet. If you have cirrhosis, it means many of your healthy liver cells have been replaced by scar tissue. This prevents blood from flowing through your liver, which makes it difficult for your liver to function. This scarring is not reversible, but treatment can prevent it from getting worse. What are the causes? Hepatitis C and long-term alcohol abuse are the most common causes of cirrhosis. Other causes include:  Nonalcoholic fatty liver disease.  Hepatitis B infection.  Autoimmune hepatitis.  Diseases that cause blockage of ducts inside the liver.  Inherited liver diseases.  Reactions to certain long-term medicines.  Parasitic infections.  Long-term exposure to certain toxins.  What increases the risk? You may have a higher risk of cirrhosis if you:  Have certain hepatitis viruses.  Abuse alcohol, especially if you are male.  Are overweight.  Share needles.  Have unprotected sex with someone who has hepatitis.  What are the signs or symptoms? You may not have any signs and symptoms at first. Symptoms may not develop until the damage to your liver starts to get worse. Signs and symptoms of cirrhosis may include:  Tenderness in the right-upper part of your abdomen.  Weakness and tiredness (fatigue).  Loss of appetite.  Nausea.  Weight loss and muscle loss.  Itchiness.  Yellow skin and eyes (jaundice).  Buildup of fluid in the abdomen (ascites).  Swelling of the feet and ankles (edema).  Appearance of tiny blood vessels under the skin.  Mental confusion.  Easy bruising and bleeding.  How is this diagnosed? Your health care provider may suspect cirrhosis based on your symptoms and medical history, especially if you have other  medical conditions or a history of alcohol abuse. Your health care provider will do a physical exam to feel your liver and check for signs of cirrhosis. Your health care provider may perform other tests, including:  Blood tests to check: ? Whether you have hepatitis B or C. ? Kidney function. ? Liver function.  Imaging tests such as: ? MRI or CT scan to look for changes seen in advanced cirrhosis. ? Ultrasound to see if normal liver tissue is being replaced by scar tissue.  A procedure using a long needle to take a sample of liver tissue (biopsy) for examination under a microscope. Liver biopsy can confirm the diagnosis of cirrhosis.  How is this treated? Treatment depends on how damaged your liver is and what caused the damage. Treatment may include treating cirrhosis symptoms or treating the underlying causes of the condition to try to slow the progression of the damage. Treatment may include:  Making lifestyle changes, such as: ? Eating a healthy diet. ? Restricting salt intake. ? Maintaining a healthy weight. ? Not abusing drugs or alcohol.  Taking medicines to: ? Treat liver infections or other infections. ? Control itching. ? Reduce fluid buildup. ? Reduce certain blood toxins. ? Reduce risk of bleeding from enlarged blood vessels in the stomach or esophagus (varices).  If varices are causing bleeding problems, you may need treatment with a procedure that ties up the vessels causing them to fall off (band ligation).  If cirrhosis is causing your liver to fail, your health care provider may recommend a liver transplant.  Other treatments may be recommended depending on any complications of cirrhosis, such as liver-related kidney failure (hepatorenal syndrome).  Follow these instructions at home:  Take medicines only as directed by your health care provider. Do not use drugs that are toxic to your liver. Ask your health care provider before taking any new medicines, including  over-the-counter medicines.  Rest as needed.  Eat a well-balanced diet. Ask your health care provider or dietitian for more information.  You may have to follow a low-salt diet or restrict your water intake as directed.  Do not drink alcohol. This is especially important if you are taking acetaminophen.  Keep all follow-up visits as directed by your health care provider. This is important. Contact a health care provider if:  You have fatigue or weakness that is getting worse.  You develop swelling of the hands, feet, legs, or face.  You have a fever.  You develop loss of appetite.  You have nausea or vomiting.  You develop jaundice.  You develop easy bruising or bleeding. Get help right away if:  You vomit bright red blood or a material that looks like coffee grounds.  You have blood in your stools.  Your stools appear black and tarry.  You become confused.  You have chest pain or trouble breathing. This information is not intended to replace advice given to you by your health care provider. Make sure you discuss any questions you have with your health care provider. Document Released: 06/18/2005 Document Revised: 10/27/2015 Document Reviewed: 02/24/2014 Elsevier Interactive Patient Education  2018  [employer].

## 2017-07-31 ENCOUNTER — Other Ambulatory Visit: Payer: Self-pay | Admitting: *Deleted

## 2017-07-31 ENCOUNTER — Telehealth: Payer: Self-pay | Admitting: Gastroenterology

## 2017-07-31 DIAGNOSIS — R772 Abnormality of alphafetoprotein: Secondary | ICD-10-CM

## 2017-07-31 LAB — CBC WITH DIFFERENTIAL/PLATELET
BASOS PCT: 0.4 %
Basophils Absolute: 28 cells/uL (ref 0–200)
Eosinophils Absolute: 78 cells/uL (ref 15–500)
Eosinophils Relative: 1.1 %
HEMATOCRIT: 41 % (ref 38.5–50.0)
HEMOGLOBIN: 14.7 g/dL (ref 13.2–17.1)
LYMPHS ABS: 2137 {cells}/uL (ref 850–3900)
MCH: 33.4 pg — ABNORMAL HIGH (ref 27.0–33.0)
MCHC: 35.9 g/dL (ref 32.0–36.0)
MCV: 93.2 fL (ref 80.0–100.0)
MPV: 10 fL (ref 7.5–12.5)
Monocytes Relative: 6.8 %
NEUTROS PCT: 61.6 %
Neutro Abs: 4374 cells/uL (ref 1500–7800)
Platelets: 99 10*3/uL — ABNORMAL LOW (ref 140–400)
RBC: 4.4 10*6/uL (ref 4.20–5.80)
RDW: 12.4 % (ref 11.0–15.0)
Total Lymphocyte: 30.1 %
WBC: 7.1 10*3/uL (ref 3.8–10.8)
WBCMIX: 483 {cells}/uL (ref 200–950)

## 2017-07-31 LAB — COMPLETE METABOLIC PANEL WITH GFR
AG Ratio: 0.5 (calc) — ABNORMAL LOW (ref 1.0–2.5)
ALKALINE PHOSPHATASE (APISO): 91 U/L (ref 40–115)
ALT: 33 U/L (ref 9–46)
AST: 52 U/L — ABNORMAL HIGH (ref 10–35)
Albumin: 2.6 g/dL — ABNORMAL LOW (ref 3.6–5.1)
BUN: 15 mg/dL (ref 7–25)
CALCIUM: 8.7 mg/dL (ref 8.6–10.3)
CO2: 26 mmol/L (ref 20–32)
CREATININE: 0.79 mg/dL (ref 0.70–1.25)
Chloride: 100 mmol/L (ref 98–110)
GFR, Est African American: 112 mL/min/{1.73_m2} (ref >=60)
GFR, Est Non African American: 96 mL/min/{1.73_m2} (ref >=60)
GLUCOSE: 131 mg/dL — AB (ref 65–99)
Globulin: 5.4 g/dL (calc) — ABNORMAL HIGH (ref 1.9–3.7)
Potassium: 4.4 mmol/L (ref 3.5–5.3)
Sodium: 132 mmol/L — ABNORMAL LOW (ref 135–146)
TOTAL PROTEIN: 8 g/dL (ref 6.1–8.1)
Total Bilirubin: 1 mg/dL (ref 0.2–1.2)

## 2017-07-31 LAB — PROTIME-INR
INR: 1.2 — ABNORMAL HIGH
PROTHROMBIN TIME: 12.8 s — AB (ref 9.0–11.5)

## 2017-07-31 LAB — HEPATITIS B SURFACE ANTIBODY,QUALITATIVE: HEP B S AB: NONREACTIVE

## 2017-07-31 LAB — DRUGS OF ABUSE SCREEN W/O ALC, ROUTINE URINE
AMPHETAMINES (1000 ng/mL SCRN): NEGATIVE
BARBITURATES: NEGATIVE
BENZODIAZEPINES: NEGATIVE
COCAINE METABOLITES: NEGATIVE
MARIJUANA MET (50 ng/mL SCRN): NEGATIVE
METHADONE: NEGATIVE
METHAQUALONE: NEGATIVE
OPIATES: NEGATIVE
PHENCYCLIDINE: NEGATIVE
PROPOXYPHENE: NEGATIVE

## 2017-07-31 LAB — AFP TUMOR MARKER: AFP-Tumor Marker: 17.2 ng/mL — ABNORMAL HIGH (ref <6.1)

## 2017-07-31 NOTE — Telephone Encounter (Signed)
To Vicente Males.

## 2017-07-31 NOTE — Telephone Encounter (Signed)
Reviewed patient's labs. Drug screen is negative: this is great news! I applaud him on his abstinence. However, outcomes for treatment will be best if we can get him established with an outpatient treatment/counseling program here in [place] (such as [facility]?) whereby he can have the support to continue drug and alcohol abstinence.   [facility] clinical pool: please see if he is willing to do an outpatient program (we have done this before for other patients, whereby they do group sessions, one on one meetings, etc). Then, return in 2 months. We will recheck drug screen then. At that point, if he has continued to meet and drug screen is negative, it would be great to pursue Hep C treatment. We also need to get an MRI of liver with eovist due to elevated AFP in setting of cirrhosis.    Doris: he needs Hep A/B vaccinations. His AFP tumor marker was elevated, but this is likely secondary to chronic hep C. However, we should get a baseline MRI liver with E[facility] to ensure nothing else is going on.   Return in 2 months.

## 2017-07-31 NOTE — Telephone Encounter (Signed)
Spoke with pt and is aware MRI scheduled for 08/07/17 at 2:00pm, arrival time 1:30pm, NPO 4 hours prior to test. He verbalized understanding

## 2017-07-31 NOTE — Telephone Encounter (Signed)
Pt is aware I am mailing him the order for the Hep A and B vaccinations. He is aware that most likely he will need to take to pharmacy to get done.

## 2017-07-31 NOTE — Telephone Encounter (Signed)
Called spoke with and he reports he feels he would be able to do this on his on. He reports [place] have transportation [place] He feels he is doing fine right now. He did agree to the MRI. Sending to Vicente Males as an FYI

## 2017-07-31 NOTE — Assessment & Plan Note (Addendum)
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 History of alcohol abuse and cocaine. Declining assistance such as AA or NA for continued support. However, he denies drinking alcohol since hospitalization. Last used cocaine about a month ago (smoking). He is declining EGD for variceal screening or colonoscopy for routine screening. HIV negative, Hep B surface antigen negative.  Will order AFP, CBC, CMP, INR, Hep B surface antibody and core antibody, and drug screen today. Would benefit from Hep C treatment, but I would recommend establishing care with a treatment program due to high likelihood of recidivism. Will touch base with scheduling coordinators to see what kind of local program here he could attend as an outpatient. May 2019 US abdomen due.   Return in 2 months: if he continues to report abstinence from alcohol and drug screens negative, along with getting involved in some type of support/treatment group, then we will pursue treatment.

## 2017-07-31 NOTE — Progress Notes (Signed)
MELD Na 15. AFP elevated but likely secondary to chronic Hep C. We are pursuing MRI liver. Recommending outpatient treatment program. Consider Hep C treatment in 2 months. Negative drug screen!! Also needs Hep A/B vaccination.

## 2017-08-01 NOTE — Progress Notes (Signed)
cc'ed to pcp °

## 2017-08-07 ENCOUNTER — Ambulatory Visit (HOSPITAL_COMMUNITY)
Admission: RE | Admit: 2017-08-07 | Discharge: 2017-08-07 | Disposition: A | Discharge status: Home / Self Care | Payer: Medicare Other | Source: Ambulatory Visit | Attending: Gastroenterology | Admitting: Gastroenterology

## 2017-08-07 DIAGNOSIS — R161 Splenomegaly, not elsewhere classified: Secondary | ICD-10-CM | POA: Diagnosis not present

## 2017-08-07 DIAGNOSIS — R772 Abnormality of alphafetoprotein: Secondary | ICD-10-CM | POA: Diagnosis not present

## 2017-08-07 DIAGNOSIS — I700 Atherosclerosis of aorta: Secondary | ICD-10-CM | POA: Insufficient documentation

## 2017-08-07 DIAGNOSIS — Q6102 Congenital multiple renal cysts: Secondary | ICD-10-CM | POA: Diagnosis not present

## 2017-08-07 DIAGNOSIS — N281 Cyst of kidney, acquired: Secondary | ICD-10-CM | POA: Diagnosis not present

## 2017-08-07 MED ORDER — GADOXETATE DISODIUM 0.25 MMOL/ML IV SOLN
8.0000 mL | Freq: Once | INTRAVENOUS | Status: AC | PRN
  Administered 2017-08-07: 8 mL via INTRAVENOUS

## 2017-08-27 ENCOUNTER — Telehealth: Payer: Self-pay | Admitting: Gastroenterology

## 2017-08-27 NOTE — Telephone Encounter (Signed)
Juan Juarez, please advise!

## 2017-08-27 NOTE — Telephone Encounter (Signed)
See phone note dated 2/26.

## 2017-08-27 NOTE — Telephone Encounter (Signed)
Please tell him thank you for calling. I am so sorry and apologize, as I had reviewed this earlier and thought that I had sent results with the labs when they were completed. Regardless, he has cirrhosis but nothing concerning regarding lesions. He has 2 mildly complex renal cysts, same as prior studies.   All other labs as noted previously. I would like for him to commit to no ETOH or drug use, and return to see me in 6 weeks. We will do another drug screen prior to that appt (please arrange). If this remains negative and he has verbalized cessation from ETOH, we can submit for Harvoni.

## 2017-08-27 NOTE — Telephone Encounter (Signed)
Patient calling for results from his MRI, he said he had it almost 3 weeks ago

## 2017-08-27 NOTE — Telephone Encounter (Signed)
PT is aware and he is fine with that. Ok to schedule OV in 6 weeks and I will mail order for him to do drug screen prior to procedure.

## 2017-08-28 ENCOUNTER — Encounter: Payer: Self-pay | Admitting: Gastroenterology

## 2017-08-28 ENCOUNTER — Other Ambulatory Visit: Payer: Self-pay

## 2017-08-28 DIAGNOSIS — F1910 Other psychoactive substance abuse, uncomplicated: Secondary | ICD-10-CM

## 2017-08-28 NOTE — Telephone Encounter (Signed)
PATIENT SCHEDULED  °

## 2017-08-28 NOTE — Telephone Encounter (Signed)
Order on file to do drug screen prior to OV.

## 2017-10-17 DIAGNOSIS — F331 Major depressive disorder, recurrent, moderate: Secondary | ICD-10-CM | POA: Diagnosis not present

## 2017-10-17 DIAGNOSIS — F411 Generalized anxiety disorder: Secondary | ICD-10-CM | POA: Diagnosis not present

## 2017-10-23 ENCOUNTER — Telehealth: Payer: Self-pay

## 2017-10-23 ENCOUNTER — Ambulatory Visit (INDEPENDENT_AMBULATORY_CARE_PROVIDER_SITE_OTHER): Payer: Medicare Other | Admitting: Gastroenterology

## 2017-10-23 ENCOUNTER — Telehealth: Payer: Self-pay | Admitting: *Deleted

## 2017-10-23 ENCOUNTER — Encounter: Payer: Self-pay | Admitting: Gastroenterology

## 2017-10-23 VITALS — BP 128/77 | HR 69 | Temp 97.1°F | Ht 67.0 in | Wt 158.0 lb

## 2017-10-23 DIAGNOSIS — B182 Chronic viral hepatitis C: Secondary | ICD-10-CM | POA: Diagnosis not present

## 2017-10-23 DIAGNOSIS — K7460 Unspecified cirrhosis of liver: Secondary | ICD-10-CM | POA: Diagnosis not present

## 2017-10-23 NOTE — Telephone Encounter (Signed)
Telephone call from [facility] and she said she called and got everything straightened out of the charges. Pt is coming in tomorrow for the labs.

## 2017-10-23 NOTE — Patient Instructions (Signed)
Please have blood work and urine test done today.  Call us on April 29th with how things are going!   Further recommendations to follow once blood work is reviewed!  It was a pleasure to see you today. I strive to create trusting relationships with patients to provide genuine, compassionate, and quality care. I value your feedback. If you receive a survey regarding your visit,  I greatly appreciate you taking time to fill this out.   Annitta Needs, PhD, ANP-BC [facility]

## 2017-10-23 NOTE — Progress Notes (Addendum)
REVIEWED. PT NEED REFERRAL TO LIVER TRANSPLANT CLINIC(MELD > 12). NEEDS TO ABSTAIN FROM ETOH FOR 6 MOS PRIOR TO HEP C TREATMENT. NEEDS TO ENROLL IN NA OR AA DUE TO HISTORY OF SUBSTANCE ABUSE. SHOULD COMPLETE EGD/TCS W/ MAC. WILL NEED UDS ONCE A MO WHILE RECEIVING TREATMENT FOR HEP C.  Primary Care Physician:  Rosita Fire, MD  Primary GI: Dr. Oneida Alar   Chief Complaint  Patient presents with  . Hepatitis C    HPI:   Juan Juarez is a 62 y.o. male presenting today with a history of chronic Hep C, genotype 1a. History of chronic ETOH abuse and polysubstance abuse (cocaine). Cirrhosis on imaging. No prior colonoscopy or EGD for screening. Declined this at last visit. HIV negative, Hep B surface antigen negative, AFP elevated but negative MRI. Negative drug screen recently. Needs Hep A/B vaccination. MELD Na 15. Child Pugh B.    No ETOH use or drug use per his report in Jan 2019. Feels like he has no energy. Easter weekend had finally gotten his sleeping aid filled after being with out it, and states he slept through [place] and didn't even know it. No confusion or mental status changes. No abdominal pain. No hematochezia or melena. No constipation or diarrhea. Appetite is pretty good. Still declining EGD/TCS.   Mobic is on his list of meds but states "haven't taken it in awhile". States it's the only thing that eases the pain of his wrist. Only takes once a week.   Past Medical History:  Diagnosis Date  . Anxiety   . Depression   . Diabetes mellitus, type II ([place])   . Gout   . Hepatitis C without hepatic coma   . Hypertension   . Insomnia   . Non compliance w medication regimen   . Osteoarthritis   . Polysubstance abuse ([place])   . Protein-calorie malnutrition, severe ([place]) 09/13/2016  . Thrombocytopenia ([place])     Past Surgical History:  Procedure Laterality Date  . None      Current Outpatient Medications  Medication Sig Dispense Refill  . alprazolam (XANAX) 2 MG tablet  Take 2 mg by mouth daily as needed for sleep.   2  . doxepin (SINEQUAN) 10 MG capsule Take 10 mg by mouth daily as needed (depression).     . metFORMIN (GLUCOPHAGE) 1000 MG tablet Take 0.5 tablets (500 mg total) by mouth 2 (two) times daily with a meal. (Patient taking differently: Take 1,000 mg by mouth 2 (two) times daily with a meal. ) 60 tablet 2  . meloxicam (MOBIC) 7.5 MG tablet Take 7.5 mg by mouth as needed for pain.     No current facility-administered medications for this visit.     Allergies as of 10/23/2017  . (No Known Allergies)    Family History  Problem Relation Age of Onset  . Thyroid disease Sister   . Colon cancer Neg Hx   . Colon polyps Neg Hx     Social History   Socioeconomic History  . Marital status: Divorced    Spouse name: Not on file  . Number of children: Not on file  . Years of education: Not on file  . Highest education level: Not on file  Occupational History  . Occupation: disabled  Social Needs  . Financial resource strain: Not on file  . Food insecurity:    Worry: Not on file    Inability: Not on file  . Transportation needs:    Medical: Not  on file    Non-medical: Not on file  Tobacco Use  . Smoking status: Current Every Day Smoker    Packs/day: 0.50    Years: 50.00    Pack years: 25.00    Types: Cigarettes  . Smokeless tobacco: Never Used  Substance and Sexual Activity  . Alcohol use: Not Currently    Frequency: Never    Comment: quit alcohol Nov 2018; denied 10/23/17  . Drug use: No    Types: Marijuana, Cocaine    Comment: 07/29/17-states he quit; denied 10/23/17  . Sexual activity: Not on file  Lifestyle  . Physical activity:    Days per week: Not on file    Minutes per session: Not on file  . Stress: Not on file  Relationships  . Social connections:    Talks on phone: Not on file    Gets together: Not on file    Attends religious service: Not on file    Active member of club or organization: Not on file    Attends  meetings of clubs or organizations: Not on file    Relationship status: Not on file  Other Topics Concern  . Not on file  Social History Narrative  . Not on file    Review of Systems: Gen: see HPI  CV: Denies chest pain, palpitations, syncope, peripheral edema, and claudication. Resp: Denies dyspnea at rest, cough, wheezing, coughing up blood, and pleurisy. GI: see HPI  Derm: Denies rash, itching, dry skin Psych: Denies depression, anxiety, memory loss, confusion. No homicidal or suicidal ideation.  Heme: Denies bruising, bleeding, and enlarged lymph nodes.  Physical Exam: BP 128/77   Pulse 69   Temp (!) 97.1 F (36.2 C) (Oral)   Ht _0  (1.702 m)   Wt 158 lb (71.7 kg)   BMI 24.75 kg/m  General:   Alert and oriented. No distress noted. Pleasant and cooperative.  Head:  Normocephalic and atraumatic. Eyes:  Conjuctiva clear without scleral icterus. Mouth:  Oral mucosa pink and moist.  Abdomen:  +BS, soft, non-tender and non-distended. No rebound or guarding. +splenomegaly.  Msk:  Symmetrical without gross deformities. Normal posture. Extremities:  Without edema. Neurologic:  Alert and  oriented x4 Psych:  Alert and cooperative. Normal mood and affect.

## 2017-10-23 NOTE — Telephone Encounter (Signed)
I called [name] and gave her the code to use for the urine drug screen.[id number]) She will call if problems.

## 2017-10-23 NOTE — Telephone Encounter (Signed)
T/C from [place] at [facility], saying the pt had some of these same labs done for [place] in Jan 2019 and Medicare did not cover and he was left with a balance of $272.95.  In order for them to do these, please give different diagnosis codes.   Doylene Bode can be reached at [phone].

## 2017-10-23 NOTE — Telephone Encounter (Signed)
Unfortunately, it may be related to his Medicare plan. If they are saying certain diagnoses did not cover, we need to have documentation of that. He has cirrhosis and Hepatitis C, so all of the things ordered were related to this. I don't have any other diagnosis that would be appropriate.

## 2017-10-23 NOTE — Telephone Encounter (Signed)
Per [place], the main one is the urine drug screen.

## 2017-10-23 NOTE — Telephone Encounter (Signed)
error 

## 2017-10-24 DIAGNOSIS — B182 Chronic viral hepatitis C: Secondary | ICD-10-CM | POA: Diagnosis not present

## 2017-10-24 DIAGNOSIS — Z79899 Other long term (current) drug therapy: Secondary | ICD-10-CM | POA: Diagnosis not present

## 2017-10-24 DIAGNOSIS — K7460 Unspecified cirrhosis of liver: Secondary | ICD-10-CM | POA: Diagnosis not present

## 2017-10-24 LAB — COMPLETE METABOLIC PANEL WITH GFR
AG Ratio: 0.6 (calc) — ABNORMAL LOW (ref 1.0–2.5)
ALBUMIN MSPROF: 2.7 g/dL — AB (ref 3.6–5.1)
ALKALINE PHOSPHATASE (APISO): 80 U/L (ref 40–115)
ALT: 39 U/L (ref 9–46)
AST: 47 U/L — ABNORMAL HIGH (ref 10–35)
BUN: 16 mg/dL (ref 7–25)
CO2: 29 mmol/L (ref 20–32)
CREATININE: 0.79 mg/dL (ref 0.70–1.25)
Calcium: 8.7 mg/dL (ref 8.6–10.3)
Chloride: 96 mmol/L — ABNORMAL LOW (ref 98–110)
GFR, EST AFRICAN AMERICAN: 111 mL/min/{1.73_m2} (ref >=60)
GFR, Est Non African American: 96 mL/min/{1.73_m2} (ref >=60)
Globulin: 4.7 g/dL (calc) — ABNORMAL HIGH (ref 1.9–3.7)
Glucose, Bld: 354 mg/dL — ABNORMAL HIGH (ref 65–139)
Potassium: 4 mmol/L (ref 3.5–5.3)
Sodium: 130 mmol/L — ABNORMAL LOW (ref 135–146)
Total Bilirubin: 1.7 mg/dL — ABNORMAL HIGH (ref 0.2–1.2)
Total Protein: 7.4 g/dL (ref 6.1–8.1)

## 2017-10-24 LAB — CBC WITH DIFFERENTIAL/PLATELET
BASOS ABS: 53 {cells}/uL (ref 0–200)
Basophils Relative: 0.6 %
EOS ABS: 150 {cells}/uL (ref 15–500)
EOS PCT: 1.7 %
HEMATOCRIT: 41.6 % (ref 38.5–50.0)
HEMOGLOBIN: 14.9 g/dL (ref 13.2–17.1)
LYMPHS ABS: 2596 {cells}/uL (ref 850–3900)
MCH: 33.7 pg — AB (ref 27.0–33.0)
MCHC: 35.8 g/dL (ref 32.0–36.0)
MCV: 94.1 fL (ref 80.0–100.0)
MPV: 9.7 fL (ref 7.5–12.5)
Monocytes Relative: 8.3 %
NEUTROS ABS: 5271 {cells}/uL (ref 1500–7800)
Neutrophils Relative %: 59.9 %
Platelets: 79 10*3/uL — ABNORMAL LOW (ref 140–400)
RBC: 4.42 10*6/uL (ref 4.20–5.80)
RDW: 13.5 % (ref 11.0–15.0)
Total Lymphocyte: 29.5 %
WBC mixed population: 730 cells/uL (ref 200–950)
WBC: 8.8 10*3/uL (ref 3.8–10.8)

## 2017-10-24 LAB — PROTIME-INR
INR: 1.2 — AB
Prothrombin Time: 12.5 s — ABNORMAL HIGH (ref 9.0–11.5)

## 2017-10-25 LAB — HEPATITIS A ANTIBODY, TOTAL: Hepatitis A AB,Total: REACTIVE — AB

## 2017-10-25 LAB — HEPATITIS B CORE ANTIBODY, TOTAL: Hep B Core Total Ab: NONREACTIVE

## 2017-10-25 NOTE — Assessment & Plan Note (Signed)
Genotype 1a. Prior MELD Na 15, Child Pugh B. Continue to abstain from drugs and alcohol. He has shown compliance and desire to pursue what is needed for his health. Further labs ordered today including drug screen. Further recommendations to follow.

## 2017-10-25 NOTE — Assessment & Plan Note (Addendum)
Secondary to chronic Hep C, +ETOH abuse in past. Remains sober from both ETOH and polysubstance abuse. Last drug screen negative. Need to update drug screen prior to consideration for treatment. Next imaging due in Aug 2019. Elevated AFP secondary to Hep C, cirrhosis, and MRI liver negative. Additional labs ordered today. Continues to decline EGD for variceal screening and routine screening colonoscopy. Discussed avoidance of Mobic in setting of chronic liver disease.

## 2017-10-28 ENCOUNTER — Telehealth: Payer: Self-pay | Admitting: *Deleted

## 2017-10-28 ENCOUNTER — Telehealth: Payer: Self-pay | Admitting: Gastroenterology

## 2017-10-28 NOTE — Telephone Encounter (Signed)
Patient called stating his legal case was put off until 01/20/18. He was too call today and let Vicente Males know.

## 2017-10-28 NOTE — Progress Notes (Signed)
CC'D TO PCP °

## 2017-10-29 NOTE — Telephone Encounter (Signed)
Great.  Thanks

## 2017-10-31 LAB — DRUGS OF ABUSE SCREEN W/O ALC, ROUTINE URINE
AMPHETAMINES (1000 ng/mL SCRN): NEGATIVE
BARBITURATES: NEGATIVE
BENZODIAZEPINES: NEGATIVE
COCAINE METABOLITES: NEGATIVE
MARIJUANA MET (50 NG/ML SCRN): NEGATIVE
METHADONE: NEGATIVE
METHAQUALONE: NEGATIVE
OPIATES: NEGATIVE
PHENCYCLIDINE: NEGATIVE
PROPOXYPHENE: NEGATIVE

## 2017-10-31 NOTE — Telephone Encounter (Signed)
Patient called back and wants to know if he needs to have any further testing done? He had labs done recently.

## 2017-11-01 NOTE — Progress Notes (Signed)
He needs Hep B vaccination only, not Hep A. Drug screen remains negative. He is MELD Na 18, Child Pugh B. He will need either Harvoni or Epclusa with ribavirin (as he is Child Pugh B) for 12 weeks or if not eligible for RBV would be a 24 week course of Harvoni or Epclusa. I am talking with Dr. Oneida Alar about which regimen. He told me he is unable to travel to [place]. Is there any way this has changed? Working on best way to get him treated.

## 2017-11-01 NOTE — Telephone Encounter (Signed)
Not right now. I am talking with Dr. Oneida Alar about best treatment regimen for him.

## 2017-11-01 NOTE — Telephone Encounter (Signed)
Called made patient aware 

## 2017-11-01 NOTE — Telephone Encounter (Signed)
Called pt, NA, VM not set up 

## 2017-11-04 ENCOUNTER — Telehealth: Payer: Self-pay | Admitting: Gastroenterology

## 2017-11-04 DIAGNOSIS — B182 Chronic viral hepatitis C: Secondary | ICD-10-CM

## 2017-11-04 NOTE — Telephone Encounter (Signed)
Discussed with Dr. Oneida Alar. Patient needs referral to [place], NP. She is recommending ETOH abstinence for 6 months. His last ETOH intake was in Jan 2019. He has had 2 negative drug screens. She is also recommending enrollment in NA or AA (as I had originally recommended at first visit) but I believe transportation has been an issue. Still recommending procedures (colonoscopy/EGD) but he has declined this.   Please refer to Roosevelt L[place], NP, at [facility] for further Hep C treatment due to Child Pugh B. MELD Na 15. I know transportation can be an issue, but does [facility] help with this? Hopefully there are resources he can use to achieve this. I know he is motivated for treatment.

## 2017-11-04 NOTE — Progress Notes (Signed)
Tried to call. VM not set up. Mailing a letter to call.  

## 2017-11-04 NOTE — Telephone Encounter (Signed)
Unable to reach pt by phone. Letter previously mailed today for pt to call.

## 2017-11-04 NOTE — Progress Notes (Signed)
Per Vicente Males, she has discussed with Dr. Oneida Alar, pt will need to be treated in [place].  See other note when pt calls.

## 2017-11-05 NOTE — Telephone Encounter (Signed)
Called pt, phone goes straight to VM, which is not set up yet. Juan Juarez has already mailed letter advising patient to call the office.

## 2017-11-07 ENCOUNTER — Inpatient Hospital Stay (HOSPITAL_COMMUNITY)
Admission: EM | Admit: 2017-11-07 | Discharge: 2017-11-08 | DRG: 638 | Discharge status: Against Medical Advice | Payer: Medicare Other | Attending: Internal Medicine | Admitting: Internal Medicine

## 2017-11-07 ENCOUNTER — Encounter (HOSPITAL_COMMUNITY): Payer: Self-pay | Admitting: *Deleted

## 2017-11-07 ENCOUNTER — Other Ambulatory Visit: Payer: Self-pay

## 2017-11-07 DIAGNOSIS — D696 Thrombocytopenia, unspecified: Secondary | ICD-10-CM | POA: Diagnosis present

## 2017-11-07 DIAGNOSIS — Z79899 Other long term (current) drug therapy: Secondary | ICD-10-CM

## 2017-11-07 DIAGNOSIS — I10 Essential (primary) hypertension: Secondary | ICD-10-CM | POA: Diagnosis present

## 2017-11-07 DIAGNOSIS — F1911 Other psychoactive substance abuse, in remission: Secondary | ICD-10-CM | POA: Diagnosis present

## 2017-11-07 DIAGNOSIS — F419 Anxiety disorder, unspecified: Secondary | ICD-10-CM | POA: Diagnosis present

## 2017-11-07 DIAGNOSIS — E7220 Disorder of urea cycle metabolism, unspecified: Secondary | ICD-10-CM | POA: Diagnosis not present

## 2017-11-07 DIAGNOSIS — B1920 Unspecified viral hepatitis C without hepatic coma: Secondary | ICD-10-CM | POA: Diagnosis not present

## 2017-11-07 DIAGNOSIS — B182 Chronic viral hepatitis C: Secondary | ICD-10-CM | POA: Diagnosis not present

## 2017-11-07 DIAGNOSIS — Z5321 Procedure and treatment not carried out due to patient leaving prior to being seen by health care provider: Secondary | ICD-10-CM | POA: Diagnosis present

## 2017-11-07 DIAGNOSIS — E0865 Diabetes mellitus due to underlying condition with hyperglycemia: Secondary | ICD-10-CM | POA: Diagnosis not present

## 2017-11-07 DIAGNOSIS — Z794 Long term (current) use of insulin: Secondary | ICD-10-CM | POA: Diagnosis not present

## 2017-11-07 DIAGNOSIS — F329 Major depressive disorder, single episode, unspecified: Secondary | ICD-10-CM | POA: Diagnosis present

## 2017-11-07 DIAGNOSIS — Z7984 Long term (current) use of oral hypoglycemic drugs: Secondary | ICD-10-CM | POA: Diagnosis not present

## 2017-11-07 DIAGNOSIS — E8809 Other disorders of plasma-protein metabolism, not elsewhere classified: Secondary | ICD-10-CM | POA: Diagnosis present

## 2017-11-07 DIAGNOSIS — D6959 Other secondary thrombocytopenia: Secondary | ICD-10-CM | POA: Diagnosis present

## 2017-11-07 DIAGNOSIS — Z720 Tobacco use: Secondary | ICD-10-CM | POA: Diagnosis present

## 2017-11-07 DIAGNOSIS — K7460 Unspecified cirrhosis of liver: Secondary | ICD-10-CM | POA: Diagnosis not present

## 2017-11-07 DIAGNOSIS — F17210 Nicotine dependence, cigarettes, uncomplicated: Secondary | ICD-10-CM | POA: Diagnosis present

## 2017-11-07 DIAGNOSIS — G9341 Metabolic encephalopathy: Secondary | ICD-10-CM | POA: Diagnosis not present

## 2017-11-07 DIAGNOSIS — E1165 Type 2 diabetes mellitus with hyperglycemia: Secondary | ICD-10-CM | POA: Diagnosis not present

## 2017-11-07 DIAGNOSIS — E1100 Type 2 diabetes mellitus with hyperosmolarity without nonketotic hyperglycemic-hyperosmolar coma (NKHHC): Secondary | ICD-10-CM | POA: Diagnosis not present

## 2017-11-07 DIAGNOSIS — M1990 Unspecified osteoarthritis, unspecified site: Secondary | ICD-10-CM | POA: Diagnosis present

## 2017-11-07 DIAGNOSIS — E1065 Type 1 diabetes mellitus with hyperglycemia: Secondary | ICD-10-CM | POA: Diagnosis not present

## 2017-11-07 DIAGNOSIS — E46 Unspecified protein-calorie malnutrition: Secondary | ICD-10-CM | POA: Diagnosis present

## 2017-11-07 DIAGNOSIS — M109 Gout, unspecified: Secondary | ICD-10-CM | POA: Diagnosis present

## 2017-11-07 DIAGNOSIS — R739 Hyperglycemia, unspecified: Secondary | ICD-10-CM

## 2017-11-07 DIAGNOSIS — F1910 Other psychoactive substance abuse, uncomplicated: Secondary | ICD-10-CM | POA: Diagnosis not present

## 2017-11-07 DIAGNOSIS — E871 Hypo-osmolality and hyponatremia: Secondary | ICD-10-CM | POA: Diagnosis not present

## 2017-11-07 DIAGNOSIS — N390 Urinary tract infection, site not specified: Secondary | ICD-10-CM | POA: Diagnosis not present

## 2017-11-07 DIAGNOSIS — R4182 Altered mental status, unspecified: Secondary | ICD-10-CM | POA: Diagnosis not present

## 2017-11-07 DIAGNOSIS — F32A Depression, unspecified: Secondary | ICD-10-CM | POA: Diagnosis present

## 2017-11-07 DIAGNOSIS — R531 Weakness: Secondary | ICD-10-CM | POA: Diagnosis not present

## 2017-11-07 LAB — COMPREHENSIVE METABOLIC PANEL
ALBUMIN: 2.5 g/dL — AB (ref 3.5–5.0)
ALK PHOS: 106 U/L (ref 38–126)
ALT: 49 U/L (ref 17–63)
ANION GAP: 9 (ref 5–15)
AST: 55 U/L — ABNORMAL HIGH (ref 15–41)
BILIRUBIN TOTAL: 1.3 mg/dL — AB (ref 0.3–1.2)
BUN: 24 mg/dL — ABNORMAL HIGH (ref 6–20)
CALCIUM: 8.9 mg/dL (ref 8.9–10.3)
CO2: 23 mmol/L (ref 22–32)
Chloride: 92 mmol/L — ABNORMAL LOW (ref 101–111)
Creatinine, Ser: 0.88 mg/dL (ref 0.61–1.24)
GLUCOSE: 692 mg/dL — AB (ref 65–99)
Potassium: 4.4 mmol/L (ref 3.5–5.1)
Sodium: 124 mmol/L — ABNORMAL LOW (ref 135–145)
TOTAL PROTEIN: 7.3 g/dL (ref 6.5–8.1)

## 2017-11-07 LAB — CBC WITH DIFFERENTIAL/PLATELET
BASOS ABS: 0 10*3/uL (ref 0.0–0.1)
Basophils Relative: 0 %
Eosinophils Absolute: 0.1 10*3/uL (ref 0.0–0.7)
Eosinophils Relative: 1 %
HEMATOCRIT: 38.1 % — AB (ref 39.0–52.0)
Hemoglobin: 13.3 g/dL (ref 13.0–17.0)
LYMPHS PCT: 39 %
Lymphs Abs: 2.2 10*3/uL (ref 0.7–4.0)
MCH: 33.9 pg (ref 26.0–34.0)
MCHC: 34.9 g/dL (ref 30.0–36.0)
MCV: 97.2 fL (ref 78.0–100.0)
Monocytes Absolute: 0.4 10*3/uL (ref 0.1–1.0)
Monocytes Relative: 7 %
NEUTROS ABS: 3 10*3/uL (ref 1.7–7.7)
Neutrophils Relative %: 53 %
PLATELETS: 88 10*3/uL — AB (ref 150–400)
RBC: 3.92 MIL/uL — ABNORMAL LOW (ref 4.22–5.81)
RDW: 14.3 % (ref 11.5–15.5)
WBC: 5.7 10*3/uL (ref 4.0–10.5)

## 2017-11-07 LAB — GLUCOSE, CAPILLARY
GLUCOSE-CAPILLARY: 434 mg/dL — AB (ref 65–99)
Glucose-Capillary: 333 mg/dL — ABNORMAL HIGH (ref 65–99)

## 2017-11-07 LAB — BLOOD GAS, VENOUS
ACID-BASE DEFICIT: 1.5 mmol/L (ref 0.0–2.0)
Bicarbonate: 23.2 mmol/L (ref 20.0–28.0)
O2 SAT: 97.7 %
PCO2 VEN: 38.4 mmHg — AB (ref 44.0–60.0)
PO2 VEN: 106 mmHg — AB (ref 32.0–45.0)
Patient temperature: 37
pH, Ven: 7.389 (ref 7.250–7.430)

## 2017-11-07 LAB — URINALYSIS, ROUTINE W REFLEX MICROSCOPIC
Bacteria, UA: NONE SEEN
Bilirubin Urine: NEGATIVE
KETONES UR: NEGATIVE mg/dL
LEUKOCYTES UA: NEGATIVE
Nitrite: NEGATIVE
PROTEIN: NEGATIVE mg/dL
Specific Gravity, Urine: 1.026 (ref 1.005–1.030)
pH: 6 (ref 5.0–8.0)

## 2017-11-07 LAB — CBG MONITORING, ED

## 2017-11-07 MED ORDER — ACETAMINOPHEN 650 MG RE SUPP: 650.0000 mg | Freq: Four times a day (QID) | RECTAL | Status: DC | PRN

## 2017-11-07 MED ORDER — ONDANSETRON HCL 4 MG PO TABS: 4.0000 mg | ORAL_TABLET | Freq: Four times a day (QID) | ORAL | Status: DC | PRN

## 2017-11-07 MED ORDER — SODIUM CHLORIDE 0.45 % IV SOLN
INTRAVENOUS | Status: DC
  Administered 2017-11-07: 23:00:00 via INTRAVENOUS

## 2017-11-07 MED ORDER — LIVING WELL WITH DIABETES BOOK
Freq: Once | Status: AC
  Administered 2017-11-08: 03:00:00
  Filled 2017-11-07: qty 1

## 2017-11-07 MED ORDER — SODIUM CHLORIDE 0.9 % IV BOLUS
1000.0000 mL | Freq: Once | INTRAVENOUS | Status: AC
  Administered 2017-11-07: 1000 mL via INTRAVENOUS

## 2017-11-07 MED ORDER — SODIUM CHLORIDE 0.9 % IV SOLN
INTRAVENOUS | Status: DC
  Administered 2017-11-07: 21:00:00 via INTRAVENOUS

## 2017-11-07 MED ORDER — METFORMIN HCL 500 MG PO TABS
1000.0000 mg | ORAL_TABLET | Freq: Two times a day (BID) | ORAL | Status: DC
Start: 2017-11-08 — End: 2017-11-08

## 2017-11-07 MED ORDER — DEXTROSE 50 % IV SOLN: 25.0000 mL | INTRAVENOUS | Status: DC | PRN

## 2017-11-07 MED ORDER — INSULIN ASPART 100 UNIT/ML ~~LOC~~ SOLN: 8.0000 [IU] | Freq: Once | SUBCUTANEOUS | Status: DC

## 2017-11-07 MED ORDER — ONDANSETRON HCL 4 MG/2ML IJ SOLN: 4.0000 mg | Freq: Four times a day (QID) | INTRAMUSCULAR | Status: DC | PRN

## 2017-11-07 MED ORDER — INSULIN REGULAR BOLUS VIA INFUSION
0.0000 [IU] | Freq: Three times a day (TID) | INTRAVENOUS | Status: DC
  Filled 2017-11-07: qty 10

## 2017-11-07 MED ORDER — SODIUM CHLORIDE 0.9 % IV SOLN
INTRAVENOUS | Status: DC
  Filled 2017-11-07: qty 1

## 2017-11-07 MED ORDER — SODIUM CHLORIDE 0.9 % IV SOLN
INTRAVENOUS | Status: DC
  Administered 2017-11-07: 5.4 [IU]/h via INTRAVENOUS
  Filled 2017-11-07 (×2): qty 1

## 2017-11-07 MED ORDER — DOXEPIN HCL 10 MG PO CAPS
10.0000 mg | ORAL_CAPSULE | Freq: Every day | ORAL | Status: DC | PRN
  Filled 2017-11-07 (×3): qty 1

## 2017-11-07 MED ORDER — DEXTROSE-NACL 5-0.45 % IV SOLN
INTRAVENOUS | Status: DC
  Administered 2017-11-08: 03:00:00 via INTRAVENOUS

## 2017-11-07 MED ORDER — ACETAMINOPHEN 325 MG PO TABS: 650.0000 mg | ORAL_TABLET | Freq: Four times a day (QID) | ORAL | Status: DC | PRN

## 2017-11-07 MED ORDER — DEXTROSE-NACL 5-0.45 % IV SOLN: INTRAVENOUS | Status: DC

## 2017-11-07 MED ORDER — ALPRAZOLAM 0.5 MG PO TABS
2.0000 mg | ORAL_TABLET | Freq: Every day | ORAL | Status: DC | PRN
Start: 2017-11-07 — End: 2017-11-08
  Administered 2017-11-07: 2 mg via ORAL
  Filled 2017-11-07: qty 4

## 2017-11-07 NOTE — ED Triage Notes (Signed)
Came in due to high blood glucose, greater than 450

## 2017-11-07 NOTE — ED Notes (Signed)
Date and time results received: 11/07/17 1910 (use smartphrase ".now" to insert current time)  Test: CBG Critical Value: >600  Name of Provider Notified: Alvino Chapel  Orders Received? Or Actions Taken?: at bedside

## 2017-11-07 NOTE — H&P (Signed)
History and Physical    Juan Juarez EYC:[id number] DOB: 1955-12-03 DOA: 11/07/2017  PCP: Rosita Fire, MD   Patient coming from: Home.  I have personally briefly reviewed patient's old medical records in [place]  Chief Complaint: High blood sugar.  HPI: Juan Juarez is a 62 y.o. male with medical history significant of anxiety, depression, insomnia, polysubstance abuse, type 2 diabetes, gout, chronic hepatitis C, thrombocytopenia, protein calorie malnutrition, hypertension, osteoarthritis who is coming to the emergency department with complaints of elevated glucose despite using his metformin 1000 mg p.o. twice daily.  his last hemoglobin A1c on our records was 12.3% and 09/03/2016. He mentions that he has been taking metformin for the past 5 years with partial results.  However, in the past week or so, he has noticed that he has had blurred vision, dizziness, along with significantly increased thirst and increased urination.  He was on this about the lack of compliance with diet, but states that this is very difficult since he lives alone.  He denies fever, chills, sore throat, wheezing, dyspnea, chest pain, palpitations, diaphoresis, PND, orthopnea or pitting edema of the lower extremities.  No abdominal pain, nausea, emesis, diarrhea, constipation, melena or hematochezia.  No dysuria, frequency or hematuria.  No heat or cold intolerance.  Denies rashes or pruritus.  ED Course: Initial vital signs temperature 36.5 C (97.7 F, pulse 74, respirations 17, blood pressure 115/74 mmHg and O2 sat 95% on room air.  He received 2 L of normal saline bolus and started on an insulin infusion in the ED.  Urine analysis shows glucosuria more than 500 mg/dL and small hemoglobinuria.  Microscopic shows 11-20 WBC per hpf.  The rest of the urinalysis values are normal.  White count was 5.7 with a normal differential, hemoglobin 13.3 and platelets 88.  A venous blood gas showed a normal pH  7.3889.  Sodium is 124, potassium 4.4, chloride 92 and CO2 23 mmol/L.  BUN 24, creatinine 0.88 and glucose 692 mg/dL.  Albumin was 2.5 and total protein 7.3 g/dL.  AST was 55, ALT 49 and alkaline phosphatase of 106 U/L.  Total bilirubin was 1.3 mg/dL.  Review of Systems: As per HPI otherwise 10 point review of systems negative.    Past Medical History:  Diagnosis Date  . Anxiety   . Depression   . Diabetes mellitus, type II ([place])   . Gout   . Hepatitis C without hepatic coma   . Hypertension   . Insomnia   . Non compliance w medication regimen   . Osteoarthritis   . Polysubstance abuse ([place])   . Protein-calorie malnutrition, severe ([place]) 09/13/2016  . Thrombocytopenia ([place])     Past Surgical History:  Procedure Laterality Date  . None       reports that he has been smoking cigarettes.  He has a 25.00 pack-year smoking history. He has never used smokeless tobacco. He reports that he drank alcohol. He reports that he does not use drugs.  No Known Allergies  Family History  Problem Relation Age of Onset  . Thyroid disease Sister   . Colon cancer Neg Hx   . Colon polyps Neg Hx     Prior to Admission medications   Medication Sig Start Date End Date Taking? Authorizing Provider  alprazolam Duanne Moron) 2 MG tablet Take 2 mg by mouth daily as needed for sleep.  09/05/16  Yes [provider]  doxepin (SINEQUAN) 10 MG  capsule Take 10 mg by mouth daily as needed (depression).    Yes [provider]  meloxicam (MOBIC) 7.5 MG tablet Take 7.5 mg by mouth as needed for pain.   Yes [provider]  metFORMIN (GLUCOPHAGE) 1000 MG tablet Take 0.5 tablets (500 mg total) by mouth 2 (two) times daily with a meal. Patient taking differently: Take 1,000 mg by mouth 2 (two) times daily with a meal.  05/13/15  Yes Cassandria Anger, MD    Physical Exam: Vitals:   11/07/17 1859 11/07/17 1931 11/07/17 2009 11/07/17 2100  BP:  115/74 112/77 125/89  Pulse:  74 72 77    Resp:  17 18 20   Temp: 97.7 F (36.5 C)  98.1 F (36.7 C)   TempSrc: Oral  Oral   SpO2:  95% 94% 97%  Weight: 72.6 kg (160 lb)     Height: 5\' 7"  (1.702 m)       Constitutional: NAD, calm, comfortable Eyes: PERRL, lids and conjunctivae normal ENMT: Mucous membranes are dry. Posterior pharynx clear of any exudate or lesions. Neck: normal, supple, no masses, no thyromegaly Respiratory: clear to auscultation bilaterally, no wheezing, no crackles. Normal respiratory effort. No accessory muscle use.  Cardiovascular: Regular rate and rhythm, no murmurs / rubs / gallops. No extremity edema. 2+ pedal pulses. No carotid bruits.  Abdomen: Soft, no tenderness, no masses palpated. No hepatosplenomegaly. Bowel sounds positive.  Musculoskeletal: no clubbing / cyanosis. Good ROM, no contractures. Normal muscle tone.  Skin: no clinically significant rashes, lesions, ulcers on very limited dermatological examination Neurologic: CN 2-12 grossly intact. Sensation intact, DTR normal. Strength 5/5 in all 4.  Psychiatric: Normal judgment and insight. Alert and oriented x 3. Normal mood.    Labs on Admission: I have personally reviewed following labs and imaging studies  CBC: Recent Labs  Lab 11/07/17 1930  WBC 5.7  NEUTROABS 3.0  HGB 13.3  HCT 38.1*  MCV 97.2  PLT 88*   Basic Metabolic Panel: Recent Labs  Lab 11/07/17 1930  NA 124*  K 4.4  CL 92*  CO2 23  GLUCOSE 692*  BUN 24*  CREATININE 0.88  CALCIUM 8.9   GFR: Estimated Creatinine Clearance: 80.3 mL/min (by C-G formula based on SCr of 0.88 mg/dL). Liver Function Tests: Recent Labs  Lab 11/07/17 1930  AST 55*  ALT 49  ALKPHOS 106  BILITOT 1.3*  PROT 7.3  ALBUMIN 2.5*   No results for input(s): LIPASE, AMYLASE in the last 168 hours. No results for input(s): AMMONIA in the last 168 hours. Coagulation Profile: No results for input(s): INR, PROTIME in the last 168 hours. Cardiac Enzymes: No results for input(s): CKTOTAL,  CKMB, CKMBINDEX, TROPONINI in the last 168 hours. BNP (last 3 results) No results for input(s): PROBNP in the last 8760 hours. HbA1C: No results for input(s): HGBA1C in the last 72 hours. CBG: Recent Labs  Lab 11/07/17 1904  GLUCAP >600*   Lipid Profile: No results for input(s): CHOL, HDL, LDLCALC, TRIG, CHOLHDL, LDLDIRECT in the last 72 hours. Thyroid Function Tests: No results for input(s): TSH, T4TOTAL, FREET4, T3FREE, THYROIDAB in the last 72 hours. Anemia Panel: No results for input(s): VITAMINB12, FOLATE, FERRITIN, TIBC, IRON, RETICCTPCT in the last 72 hours. Urine analysis:    Component Value Date/Time   COLORURINE AMBER (A) 05/01/2017 1515   APPEARANCEUR CLEAR 05/01/2017 1515   LABSPEC 1.026 05/01/2017 1515   PHURINE 5.0 05/01/2017 1515   GLUCOSEU 150 (A) 05/01/2017 1515   HGBUR NEGATIVE  05/01/2017 1515   BILIRUBINUR SMALL (A) 05/01/2017 1515   KETONESUR 80 (A) 05/01/2017 1515   PROTEINUR 30 (A) 05/01/2017 1515   UROBILINOGEN 0.2 07/01/2014 2244   NITRITE NEGATIVE 05/01/2017 [place] 05/01/2017 1515    Radiological Exams on Admission: No results found.  EKG: Independently reviewed. Vent. rate 74 BPM PR interval * ms QRS duration 94 ms QT/QTc 405/450 ms P-R-T axes 48 -13 15 Sinus rhythm Atrial premature complex.  Assessment/Plan Principal Problem:   Diabetic hyperosmolar non-ketotic state ([place]) Admit to stepdown/inpatient. Continue IV fluids. Continue insulin infusion. Monitor CBG hourly. BMP every 4 hours x 3. Check magnesium and phosphorus. Check hemoglobin A1c in the morning. Consider nutritionist evaluation. May need insulin on a regular basis.  Active Problems:   Essential hypertension, benign Normotensive at this time. May have resolved with weight loss. Monitor blood pressure.    Depression On as needed doxepin? Continue doxepin 10 mg p.o. Daily PRN. Continue alprazolam at bedtime as needed.    Thrombocytopenia  ([place]) Secondary to hep C. May benefit from GI evaluation. Monitor platelet level.    Hypoalbuminemia Secondary to liver cirrhosis. Monitor albumin level. May benefit from nutrition and evaluation.    Osteoarthritis Has been using meloxicam. Worried about use of NSAIDs in a patient with liver cirrhosis. May benefit from GI evaluation as an outpatient. May benefit from PPI or H2 blocker therapy.    Tobacco use  Declined nicotine replacement therapy. Staff to provide tobacco cessation information.    DVT prophylaxis: SCDs. Code Status: Full code. Family Communication: None at bedside. Disposition Plan: Admit to stepdown to continue insulin infusion treatment. Consults called: Admission status: Inpatient/stepdown.   Reubin [name] MD [facility] Pager [phone].  If 7PM-7AM, please contact night-coverage [email or web address] Password [email or web address]  11/07/2017, 9:29 PM

## 2017-11-07 NOTE — ED Notes (Signed)
Date and time results received: 11/07/17 2016 (use smartphrase ".now" to insert current time)  Test: blood glucose  Critical Value: 692  Name of Provider Notified: pickering   Orders Received? Or Actions Taken?:

## 2017-11-08 ENCOUNTER — Emergency Department (HOSPITAL_COMMUNITY): Payer: Medicare Other

## 2017-11-08 ENCOUNTER — Encounter (HOSPITAL_COMMUNITY): Payer: Self-pay | Admitting: *Deleted

## 2017-11-08 ENCOUNTER — Other Ambulatory Visit: Payer: Self-pay

## 2017-11-08 ENCOUNTER — Inpatient Hospital Stay (HOSPITAL_COMMUNITY)
Admission: EM | Admit: 2017-11-08 | Discharge: 2017-11-11 | Disposition: A | Discharge status: Home Health | Payer: Medicare Other | Source: Home / Self Care | Attending: Internal Medicine | Admitting: Internal Medicine

## 2017-11-08 DIAGNOSIS — E7220 Disorder of urea cycle metabolism, unspecified: Secondary | ICD-10-CM

## 2017-11-08 DIAGNOSIS — E1100 Type 2 diabetes mellitus with hyperosmolarity without nonketotic hyperglycemic-hyperosmolar coma (NKHHC): Secondary | ICD-10-CM | POA: Diagnosis present

## 2017-11-08 DIAGNOSIS — I10 Essential (primary) hypertension: Secondary | ICD-10-CM

## 2017-11-08 DIAGNOSIS — D696 Thrombocytopenia, unspecified: Secondary | ICD-10-CM | POA: Diagnosis present

## 2017-11-08 DIAGNOSIS — R739 Hyperglycemia, unspecified: Secondary | ICD-10-CM

## 2017-11-08 DIAGNOSIS — Z720 Tobacco use: Secondary | ICD-10-CM

## 2017-11-08 DIAGNOSIS — B1920 Unspecified viral hepatitis C without hepatic coma: Secondary | ICD-10-CM | POA: Diagnosis present

## 2017-11-08 DIAGNOSIS — R4182 Altered mental status, unspecified: Secondary | ICD-10-CM

## 2017-11-08 DIAGNOSIS — E871 Hypo-osmolality and hyponatremia: Secondary | ICD-10-CM | POA: Diagnosis present

## 2017-11-08 DIAGNOSIS — G9341 Metabolic encephalopathy: Secondary | ICD-10-CM

## 2017-11-08 DIAGNOSIS — E1165 Type 2 diabetes mellitus with hyperglycemia: Secondary | ICD-10-CM

## 2017-11-08 DIAGNOSIS — K7460 Unspecified cirrhosis of liver: Secondary | ICD-10-CM | POA: Diagnosis present

## 2017-11-08 DIAGNOSIS — N390 Urinary tract infection, site not specified: Secondary | ICD-10-CM | POA: Diagnosis present

## 2017-11-08 DIAGNOSIS — F1910 Other psychoactive substance abuse, uncomplicated: Secondary | ICD-10-CM | POA: Diagnosis present

## 2017-11-08 LAB — BASIC METABOLIC PANEL
ANION GAP: 6 (ref 5–15)
Anion gap: 9 (ref 5–15)
BUN: 19 mg/dL (ref 6–20)
BUN: 19 mg/dL (ref 6–20)
CALCIUM: 8.3 mg/dL — AB (ref 8.9–10.3)
CO2: 26 mmol/L (ref 22–32)
CO2: 24 mmol/L (ref 22–32)
CREATININE: 0.74 mg/dL (ref 0.61–1.24)
Calcium: 8.2 mg/dL — ABNORMAL LOW (ref 8.9–10.3)
Chloride: 99 mmol/L — ABNORMAL LOW (ref 101–111)
Chloride: 102 mmol/L (ref 101–111)
Creatinine, Ser: 0.57 mg/dL — ABNORMAL LOW (ref 0.61–1.24)
GFR calc non Af Amer: >60 mL/min (ref >60)
GLUCOSE: 354 mg/dL — AB (ref 65–99)
Glucose, Bld: 168 mg/dL — ABNORMAL HIGH (ref 65–99)
Potassium: 3.5 mmol/L (ref 3.5–5.1)
Potassium: 3.1 mmol/L — ABNORMAL LOW (ref 3.5–5.1)
Sodium: 132 mmol/L — ABNORMAL LOW (ref 135–145)
Sodium: 134 mmol/L — ABNORMAL LOW (ref 135–145)

## 2017-11-08 LAB — HEMOGLOBIN A1C
HEMOGLOBIN A1C: 12.4 % — AB (ref 4.8–5.6)
MEAN PLASMA GLUCOSE: 309.18 mg/dL

## 2017-11-08 LAB — COMPREHENSIVE METABOLIC PANEL
ALT: 47 U/L (ref 17–63)
ANION GAP: 7 (ref 5–15)
AST: 70 U/L — ABNORMAL HIGH (ref 15–41)
Albumin: 2.1 g/dL — ABNORMAL LOW (ref 3.5–5.0)
Alkaline Phosphatase: 71 U/L (ref 38–126)
BUN: 17 mg/dL (ref 6–20)
CO2: 24 mmol/L (ref 22–32)
Calcium: 8 mg/dL — ABNORMAL LOW (ref 8.9–10.3)
Chloride: 100 mmol/L — ABNORMAL LOW (ref 101–111)
Creatinine, Ser: 0.74 mg/dL (ref 0.61–1.24)
GFR calc Af Amer: >60 mL/min (ref >60)
GFR calc non Af Amer: >60 mL/min (ref >60)
GLUCOSE: 498 mg/dL — AB (ref 65–99)
POTASSIUM: 3.9 mmol/L (ref 3.5–5.1)
SODIUM: 131 mmol/L — AB (ref 135–145)
Total Bilirubin: 1.1 mg/dL (ref 0.3–1.2)
Total Protein: 6.3 g/dL — ABNORMAL LOW (ref 6.5–8.1)

## 2017-11-08 LAB — BLOOD GAS, ARTERIAL
ACID-BASE DEFICIT: 0.8 mmol/L (ref 0.0–2.0)
Bicarbonate: 23.7 mmol/L (ref 20.0–28.0)
DRAWN BY: 221791
FIO2: 21
O2 Saturation: 93.2 %
pCO2 arterial: 38.8 mmHg (ref 32.0–48.0)
pH, Arterial: 7.397 (ref 7.350–7.450)
pO2, Arterial: 71.4 mmHg — ABNORMAL LOW (ref 83.0–108.0)

## 2017-11-08 LAB — CBC
HEMATOCRIT: 34.7 % — AB (ref 39.0–52.0)
HEMOGLOBIN: 12.3 g/dL — AB (ref 13.0–17.0)
MCH: 34 pg (ref 26.0–34.0)
MCHC: 35.4 g/dL (ref 30.0–36.0)
MCV: 95.9 fL (ref 78.0–100.0)
Platelets: 76 10*3/uL — ABNORMAL LOW (ref 150–400)
RBC: 3.62 MIL/uL — AB (ref 4.22–5.81)
RDW: 14.2 % (ref 11.5–15.5)
WBC: 5.6 10*3/uL (ref 4.0–10.5)

## 2017-11-08 LAB — RAPID URINE DRUG SCREEN, HOSP PERFORMED
AMPHETAMINES: NOT DETECTED
BARBITURATES: NOT DETECTED
Benzodiazepines: POSITIVE — AB
Cocaine: NOT DETECTED
Opiates: NOT DETECTED
Tetrahydrocannabinol: NOT DETECTED

## 2017-11-08 LAB — CBC WITH DIFFERENTIAL/PLATELET
Basophils Absolute: 0 10*3/uL (ref 0.0–0.1)
Basophils Relative: 0 %
Eosinophils Absolute: 0.1 10*3/uL (ref 0.0–0.7)
Eosinophils Relative: 1 %
HEMATOCRIT: 33.7 % — AB (ref 39.0–52.0)
HEMOGLOBIN: 12.1 g/dL — AB (ref 13.0–17.0)
LYMPHS PCT: 32 %
Lymphs Abs: 1.6 10*3/uL (ref 0.7–4.0)
MCH: 34.5 pg — ABNORMAL HIGH (ref 26.0–34.0)
MCHC: 35.9 g/dL (ref 30.0–36.0)
MCV: 96 fL (ref 78.0–100.0)
MONO ABS: 0.4 10*3/uL (ref 0.1–1.0)
MONOS PCT: 8 %
NEUTROS ABS: 2.8 10*3/uL (ref 1.7–7.7)
NEUTROS PCT: 59 %
Platelets: 72 10*3/uL — ABNORMAL LOW (ref 150–400)
RBC: 3.51 MIL/uL — ABNORMAL LOW (ref 4.22–5.81)
RDW: 14.4 % (ref 11.5–15.5)
WBC: 4.9 10*3/uL (ref 4.0–10.5)

## 2017-11-08 LAB — PROTIME-INR
INR: 1.21
Prothrombin Time: 15.2 seconds (ref 11.4–15.2)

## 2017-11-08 LAB — URINALYSIS, COMPLETE (UACMP) WITH MICROSCOPIC
BILIRUBIN URINE: NEGATIVE
Bacteria, UA: NONE SEEN
Glucose, UA: >=500 mg/dL — AB
Ketones, ur: NEGATIVE mg/dL
NITRITE: NEGATIVE
PROTEIN: NEGATIVE mg/dL
Specific Gravity, Urine: 1.025 (ref 1.005–1.030)
pH: 6 (ref 5.0–8.0)

## 2017-11-08 LAB — GLUCOSE, CAPILLARY
GLUCOSE-CAPILLARY: 150 mg/dL — AB (ref 65–99)
GLUCOSE-CAPILLARY: 153 mg/dL — AB (ref 65–99)
GLUCOSE-CAPILLARY: 343 mg/dL — AB (ref 65–99)
GLUCOSE-CAPILLARY: 439 mg/dL — AB (ref 65–99)
Glucose-Capillary: 328 mg/dL — ABNORMAL HIGH (ref 65–99)
Glucose-Capillary: 197 mg/dL — ABNORMAL HIGH (ref 65–99)
Glucose-Capillary: 154 mg/dL — ABNORMAL HIGH (ref 65–99)
Glucose-Capillary: 160 mg/dL — ABNORMAL HIGH (ref 65–99)
Glucose-Capillary: 150 mg/dL — ABNORMAL HIGH (ref 65–99)

## 2017-11-08 LAB — CBG MONITORING, ED
GLUCOSE-CAPILLARY: 405 mg/dL — AB (ref 65–99)
Glucose-Capillary: 458 mg/dL — ABNORMAL HIGH (ref 65–99)

## 2017-11-08 LAB — ETHANOL

## 2017-11-08 LAB — MRSA PCR SCREENING: MRSA by PCR: POSITIVE — AB

## 2017-11-08 LAB — AMMONIA: AMMONIA: 61 umol/L — AB (ref 9–35)

## 2017-11-08 MED ORDER — SODIUM CHLORIDE 0.9 % IV SOLN
INTRAVENOUS | Status: DC
  Administered 2017-11-08: 13:00:00 via INTRAVENOUS

## 2017-11-08 MED ORDER — GLIPIZIDE 5 MG PO TABS: 5.0000 mg | ORAL_TABLET | Freq: Every day | ORAL | 0 refills | Status: DC

## 2017-11-08 MED ORDER — INSULIN ASPART 100 UNIT/ML ~~LOC~~ SOLN: 0.0000 [IU] | Freq: Every day | SUBCUTANEOUS | Status: DC

## 2017-11-08 MED ORDER — INSULIN ASPART 100 UNIT/ML ~~LOC~~ SOLN
10.0000 [IU] | Freq: Once | SUBCUTANEOUS | Status: AC
  Administered 2017-11-08: 10 [IU] via SUBCUTANEOUS
  Filled 2017-11-08: qty 1

## 2017-11-08 MED ORDER — INSULIN ASPART 100 UNIT/ML ~~LOC~~ SOLN
0.0000 [IU] | Freq: Three times a day (TID) | SUBCUTANEOUS | Status: DC
  Administered 2017-11-09: 11 [IU] via SUBCUTANEOUS

## 2017-11-08 MED ORDER — INSULIN GLARGINE 100 UNIT/ML ~~LOC~~ SOLN
10.0000 [IU] | Freq: Every day | SUBCUTANEOUS | Status: DC
  Administered 2017-11-08: 10 [IU] via SUBCUTANEOUS
  Filled 2017-11-08 (×3): qty 0.1

## 2017-11-08 MED ORDER — POTASSIUM CHLORIDE CRYS ER 20 MEQ PO TBCR: 40.0000 meq | EXTENDED_RELEASE_TABLET | Freq: Once | ORAL | Status: DC

## 2017-11-08 MED ORDER — ALPRAZOLAM 1 MG PO TABS
1.0000 mg | ORAL_TABLET | Freq: Every evening | ORAL | Status: DC | PRN
  Administered 2017-11-08 – 2017-11-10 (×3): 1 mg via ORAL
  Filled 2017-11-08 (×3): qty 1

## 2017-11-08 MED ORDER — MUPIROCIN 2 % EX OINT
1.0000 "application " | TOPICAL_OINTMENT | Freq: Two times a day (BID) | CUTANEOUS | Status: DC
  Administered 2017-11-08 – 2017-11-11 (×6): 1 via NASAL
  Filled 2017-11-08: qty 22

## 2017-11-08 MED ORDER — CHLORHEXIDINE GLUCONATE CLOTH 2 % EX PADS
6.0000 | MEDICATED_PAD | Freq: Every day | CUTANEOUS | Status: DC
  Administered 2017-11-11: 6 via TOPICAL

## 2017-11-08 MED ORDER — NALOXONE HCL 2 MG/2ML IJ SOSY
1.0000 mg | PREFILLED_SYRINGE | Freq: Once | INTRAMUSCULAR | Status: AC
  Administered 2017-11-08: 1 mg via INTRAVENOUS
  Filled 2017-11-08: qty 2

## 2017-11-08 MED ORDER — CHLORHEXIDINE GLUCONATE CLOTH 2 % EX PADS: 6.0000 | MEDICATED_PAD | Freq: Every day | CUTANEOUS | Status: DC

## 2017-11-08 MED ORDER — MUPIROCIN 2 % EX OINT: 1.0000 "application " | TOPICAL_OINTMENT | Freq: Two times a day (BID) | CUTANEOUS | Status: DC

## 2017-11-08 MED ORDER — DOXEPIN HCL 25 MG PO CAPS
25.0000 mg | ORAL_CAPSULE | Freq: Every evening | ORAL | Status: DC | PRN
  Administered 2017-11-08 – 2017-11-10 (×3): 25 mg via ORAL
  Filled 2017-11-08 (×3): qty 1

## 2017-11-08 MED ORDER — SODIUM CHLORIDE 0.9 % IV BOLUS
1000.0000 mL | Freq: Once | INTRAVENOUS | Status: AC
  Administered 2017-11-08: 1000 mL via INTRAVENOUS

## 2017-11-08 MED ORDER — SODIUM CHLORIDE 0.9 % IV SOLN
INTRAVENOUS | Status: DC
  Administered 2017-11-08: 17:00:00 via INTRAVENOUS

## 2017-11-08 MED ORDER — CEFTRIAXONE SODIUM 1 G IJ SOLR
1.0000 g | Freq: Once | INTRAMUSCULAR | Status: AC
  Administered 2017-11-08: 1 g via INTRAVENOUS
  Filled 2017-11-08: qty 10

## 2017-11-08 MED ORDER — INSULIN GLARGINE 100 UNIT/ML ~~LOC~~ SOLN
10.0000 [IU] | Freq: Every day | SUBCUTANEOUS | Status: DC
  Filled 2017-11-08 (×4): qty 0.1

## 2017-11-08 MED ORDER — SODIUM CHLORIDE 0.9 % IV SOLN
1.0000 g | INTRAVENOUS | Status: DC
  Administered 2017-11-09 – 2017-11-10 (×2): 1 g via INTRAVENOUS
  Filled 2017-11-08: qty 10
  Filled 2017-11-08 (×2): qty 1

## 2017-11-08 MED ORDER — INSULIN ASPART 100 UNIT/ML ~~LOC~~ SOLN: 0.0000 [IU] | Freq: Three times a day (TID) | SUBCUTANEOUS | Status: DC

## 2017-11-08 NOTE — ED Triage Notes (Addendum)
Pt c/o feeling weak and "disoriented". Pt reports he was admitted to the hospital yesterday due to his blood sugar being up to 700 and he left AMA this morning due to "personal reasons". Pt then felt like he needed to come back because he started feeling very weak and "disoriented". Pt lethargic in triage with slow speech. Pt denies ETOH and drug use. Pt reports his blood sugar was down to 130 when he left the hospital this morning. CBG 408 in triage.

## 2017-11-08 NOTE — Progress Notes (Signed)
eLink Physician-Brief Progress Note Patient Name: Juan Juarez DOB: Apr 22, 1956 MRN: [id number]   Date of Service  11/08/2017  HPI/Events of Note  hypokalemia  eICU Interventions  Replace po     Intervention Category Minor Interventions: Electrolytes abnormality - evaluation and management  Sharia Reeve 11/08/2017, 5:59 AM

## 2017-11-08 NOTE — Discharge Summary (Signed)
Physician Discharge Summary  Juan Juarez KDX:[id number] DOB: [birth year] DOA: 11/07/2017  PCP: Rosita Fire, MD  Admit date: 11/07/2017 Discharge date: 11/08/2017  Admitted From:Home Disposition:  AGAINST MEDICAL ADVICE      Discharge Condition: AGAINST MEDICAL ADVICE     Brief/Interim Summary: 62 year old male with a history of anxiety/depression, substance abuse, diarrhea mellitus, chronic hepatitis C, thrombus cytopenia, hypertension presenting because of polydipsia and polyuria for the past week.  He began complaining of some blurry vision and dizziness.  He endorses dietary indiscretion.  The patient checked his CBGs and noted to be " high".  As result, the patient presented for further evaluation and treatment.  At the time presentation, the patient was noted to have a serum glucose of 692 with normal anion gap.  The patient was started on IV insulin and IV fluids for a hyperosmolar nonketotic state.  The patient improved clinically with improvement of his serum glucose.  He was transitioned off of IV insulin.  On the morning of 11/08/2017, the patient wanted to leave [place].  The patient had not yet been started on a diet, nor had he received his subcutaneous insulin which was ordered prior to his discharge.  The patient refused to stay to receive his dose of Lantus.  He ultimately left AGAINST MEDICAL ADVICE.  The patient stated that he had to leave because he had to move his mobile home or else he would lose possession of it.  Discharge Diagnoses:  Hyperosmolar Nonketotic State -pt endorses poor compliance with diet -patient started on IV insulin with q 1 hour CBG check and q 4 hour BMPs -pt started on aggressive fluid resuscitation -Electrolytes were monitored and repleted -transitioned to [place] insulin once anion gap closed -diet was advanced once anion gap closed -HbA1C--pending at time of d/c -pt will likely need insulin--however, labs still pending to help  determine need for insulin after d/c -11/08/17--pt wanted to leave AMA for social situation -In speaking with Claudia Pollock, he demonstrated the ability to understand the serious nature and consequences of leaving the hospital prior to completion of an adequate course of treatment. He was able to articulate the consequences of leaving prior to completion of adequate therapy, and he understood that leaving could result in bodily harm or even death.  -added glipizide to metformin for d/c -Patient likely needs insulin, but hemoglobin A1c results pending and patient refused to stay for continued glucose monitoring   In the morning of 11/08/2017,I was informed that Juan Juarez wished to leave the hospital against medical advice. Juan Juarez stated that he wished to leave the hospital prior to completing medical therapy because he felt much better, was able to eat and drink and was uncomfortable and did not want to stay. We dicussed with him the nature of his present illness, that he was being kept in the hospital due to ongoing active issues which included: Intermittent difficulties tolerating p.o, and management of hyperglycemia. It was explained that inpatient therapy was still warranted because of the risk of recurrence of DKA  that the risks of leaving prior to completion of treatment and preparation of a safe discharge plan would be recurrence of DKA worsening possibly leading to deathUltimately, Juan Juarez chose to forgo further treatment in the inpatient setting.   Thrombocytopenia -chronic dating back to 2015 -suspect liver cirrhosis due to hep c -left AMA before more work up could be done  Depression/Anxiety -continue home alprazolam, sinequan  Tobacco abuse I have discussed tobacco cessation  with the patient.  I have counseled the patient regarding the negative impacts of continued tobacco use including but not limited to lung cancer, COPD, and cardiovascular disease.  I have discussed  alternatives to tobacco and modalities that may help facilitate tobacco cessation including but not limited to biofeedback, hypnosis, and medications.  Total time spent with tobacco counseling was 4 minutes.  Polysubstance abuse -10/24/2017 urine drug screen was negative  Discharge Instructions   Allergies as of 11/08/2017   No Known Allergies     Medication List    TAKE these medications   alprazolam 2 MG tablet Commonly known as:  XANAX Take 2 mg by mouth daily as needed for sleep.   doxepin 10 MG capsule Commonly known as:  SINEQUAN Take 10 mg by mouth daily as needed (depression).   glipiZIDE 5 MG tablet Commonly known as:  GLUCOTROL Take 1 tablet (5 mg total) by mouth daily before breakfast.   meloxicam 7.5 MG tablet Commonly known as:  MOBIC Take 7.5 mg by mouth as needed for pain.   metFORMIN 1000 MG tablet Commonly known as:  GLUCOPHAGE Take 0.5 tablets (500 mg total) by mouth 2 (two) times daily with a meal. What changed:  how much to take       No Known Allergies  Consultations:  none   Procedures/Studies: No results found.      Discharge Exam: Vitals:   11/08/17 0615 11/08/17 0630  BP: 108/70   Pulse: (!) 56 60  Resp: (!) 5 15  Temp:    SpO2: 95% 97%   Vitals:   11/08/17 0545 11/08/17 0600 11/08/17 0615 11/08/17 0630  BP: 105/69 126/82 108/70   Pulse: (!) 59 65 (!) 56 60  Resp: 19 19 (!) 5 15  Temp:      TempSrc:      SpO2: 93% 97% 95% 97%  Weight:      Height:        General: Pt is alert, awake, not in acute distress Cardiovascular: RRR, S1/S2 +, no rubs, no gallops Respiratory: CTA bilaterally, no wheezing, no rhonchi Abdominal: Soft, NT, ND, bowel sounds + Extremities: no edema, no cyanosis   The results of significant diagnostics from this hospitalization (including imaging, microbiology, ancillary and laboratory) are listed below for reference.    Significant Diagnostic Studies: No results  found.   Microbiology: Recent Results (from the past 240 hour(s))  MRSA PCR Screening     Status: Abnormal   Collection Time: 11/07/17 10:03 PM  Result Value Ref Range Status   MRSA by PCR POSITIVE (A) NEGATIVE Final    Comment:        The GeneXpert MRSA Assay (FDA approved for NASAL specimens only), is one component of a comprehensive MRSA colonization surveillance program. It is not intended to diagnose MRSA infection nor to guide or monitor treatment for MRSA infections. RESULT CALLED TO, READ BACK BY AND VERIFIED WITH: WAGGONER,R AT [id number] BY HUFFINES,S ON 11/08/17. Performed at [facility], [address], [place], [place] [address]      Labs: Basic Metabolic Panel: Recent Labs  Lab 11/07/17 1930 11/08/17 0050 11/08/17 0441  NA 124* 132* 134*  K 4.4 3.5 3.1*  CL 92* 99* 102  CO2 23 24 26   GLUCOSE 692* 354* 168*  BUN 24* 19 19  CREATININE 0.88 0.74 0.57*  CALCIUM 8.9 8.3* 8.2*   Liver Function Tests: Recent Labs  Lab 11/07/17 1930  AST 55*  ALT 49  ALKPHOS 106  BILITOT 1.3*  PROT 7.3  ALBUMIN 2.5*   No results for input(s): LIPASE, AMYLASE in the last 168 hours. No results for input(s): AMMONIA in the last 168 hours. CBC: Recent Labs  Lab 11/07/17 1930 11/08/17 0441  WBC 5.7 5.6  NEUTROABS 3.0  --   HGB 13.3 12.3*  HCT 38.1* 34.7*  MCV 97.2 95.9  PLT 88* 76*   Cardiac Enzymes: No results for input(s): CKTOTAL, CKMB, CKMBINDEX, TROPONINI in the last 168 hours. BNP: Invalid input(s): POCBNP CBG: Recent Labs  Lab 11/08/17 0413 11/08/17 0506 11/08/17 0607 11/08/17 0656 11/08/17 0807  GLUCAP 153* 154* 160* 150* 150*    Time coordinating discharge:  36 minutes  Signed:  Orson Eva, DO [facility] Pager: [phone] 11/08/2017, 9:49 AM

## 2017-11-08 NOTE — ED Provider Notes (Signed)
[facility] Provider Note   CSN: [id number] Arrival date & time: 11/08/17  1055    5 caveat: Altered mental status History   Chief Complaint Chief Complaint  Patient presents with  . Hyperglycemia    HPI Juan Juarez is a 62 y.o. male.  HPI Patient presents to the emergency room for altered mental status.  According to the medical records, the patient was admitted to the hospital yesterday for hyperglycemia.  Patient was treated with IV fluids and insulin and his blood sugar improved.  According to the medical records the patient ended up leaving AMA from the hospital this morning.  Few hours later the patient returned to the emergency room stating that he was not feeling well.  Patient is very somnolent and is difficult to get any history for him.  He told the nurses that he was not feeling well.  Patient denies any alcohol or drug abuse.  Denies any recent injuries.  When I asked him what brought him back to the emergency room is difficult to understand what he saying. Past Medical History:  Diagnosis Date  . Anxiety   . Depression   . Diabetes mellitus, type II ([place])   . Gout   . Hepatitis C without hepatic coma   . Hypertension   . Insomnia   . Non compliance w medication regimen   . Osteoarthritis   . Polysubstance abuse ([place])   . Protein-calorie malnutrition, severe ([place]) 09/13/2016  . Thrombocytopenia [facility])     Patient Active Problem List   Diagnosis Date Noted  . Diabetic hyperosmolar non-ketotic state ([place]) 11/07/2017  . Osteoarthritis 11/07/2017  . Tobacco use 11/07/2017  . 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 Nausea and vomiting in adult   . Hepatitis C virus infection without hepatic coma   . Splenomegaly   . Hypoalbuminemia   . Vomiting 05/01/2017  . Dehydration 05/01/2017  . Hyponatremia 05/01/2017  . Cocaine abuse ([place]) 05/01/2017  . Hyperglycemia 09/13/2016  . Polysubstance abuse ([place]) 09/13/2016  . Elevated LFTs 09/13/2016   . Protein-calorie malnutrition, severe ([place]) 09/13/2016  . Depression 09/13/2016  . Thrombocytopenia ([place]) 09/13/2016  . Diabetes mellitus with hyperglycemia ([place]) 05/13/2015  . Essential hypertension, benign 05/13/2015  . Smoker 05/13/2015  . Back pain at L4-L5 level 11/05/2013  . Neck pain 11/05/2013  . Cervical spondylosis 11/05/2013  . Spinal stenosis of lumbar region 11/05/2013    Past Surgical History:  Procedure Laterality Date  . None          Home Medications    Prior to Admission medications   Medication Sig Start Date End Date Taking? Authorizing Provider  alprazolam Duanne Moron) 2 MG tablet Take 2 mg by mouth daily as needed for sleep.  09/05/16   [provider]  doxepin (SINEQUAN) 10 MG capsule Take 10 mg by mouth daily as needed (depression).     [provider]  glipiZIDE (GLUCOTROL) 5 MG tablet Take 1 tablet (5 mg total) by mouth daily before breakfast. 11/08/17   Tat, Shanon Brow, MD  meloxicam (MOBIC) 7.5 MG tablet Take 7.5 mg by mouth as needed for pain.    [provider]  metFORMIN (GLUCOPHAGE) 1000 MG tablet Take 0.5 tablets (500 mg total) by mouth 2 (two) times daily with a meal. Patient taking differently: Take 1,000 mg by mouth 2 (two) times daily with a meal.  05/13/15   Nida, Marella Chimes, MD    Family History Family History  Problem Relation Age of  Onset  . Thyroid disease Sister   . Colon cancer Neg Hx   . Colon polyps Neg Hx     Social History Social History   Tobacco Use  . Smoking status: Current Every Day Smoker    Packs/day: 0.50    Years: 50.00    Pack years: 25.00    Types: Cigarettes  . Smokeless tobacco: Never Used  Substance Use Topics  . Alcohol use: Not Currently    Frequency: Never    Comment: quit alcohol Nov 2018; denied 11/08/17  . Drug use: No    Types: Marijuana, Cocaine    Comment: 07/29/17-states he quit; denied 11/08/17     Allergies   Patient has no known allergies.   Review of  Systems Review of Systems  All other systems reviewed and are negative.    Physical Exam Updated Vital Signs BP 99/68   Pulse 63   Temp 97.8 F (36.6 C) (Oral)   Resp 19   Ht 1.702 m (5\' 7" )   Wt 72.6 kg (160 lb)   SpO2 95%   BMI 25.06 kg/m   Physical Exam  Constitutional: He appears listless.  HENT:  Head: Normocephalic and atraumatic.  Right Ear: External ear normal.  Left Ear: External ear normal.  Eyes: Conjunctivae are normal. Right eye exhibits no discharge. Left eye exhibits no discharge. No scleral icterus.  Neck: Neck supple. No tracheal deviation present.  Cardiovascular: Normal rate, regular rhythm and intact distal pulses.  Pulmonary/Chest: Effort normal and breath sounds normal. No stridor. No respiratory distress. He has no wheezes. He has no rales.  Abdominal: Soft. Bowel sounds are normal. He exhibits no distension. There is no tenderness. There is no rebound and no guarding.  Musculoskeletal: He exhibits no edema or tenderness.  Neurological: He appears listless. A cranial nerve deficit (no facial droop, extraocular movements intact,speech is garbled) is present. No sensory deficit. He exhibits normal muscle tone. He displays no seizure activity. Coordination normal. GCS eye subscore is 4. GCS verbal subscore is 3. GCS motor subscore is 6.  Skin: Skin is warm and dry. No rash noted.  Psychiatric: He has a normal mood and affect.  Nursing note and vitals reviewed.    ED Treatments / Results  Labs (all labs ordered are listed, but only abnormal results are displayed) Labs Reviewed  COMPREHENSIVE METABOLIC PANEL - Abnormal; Notable for the following components:      Result Value   Sodium 131 (*)    Chloride 100 (*)    Glucose, Bld 498 (*)    Calcium 8.0 (*)    Total Protein 6.3 (*)    Albumin 2.1 (*)    AST 70 (*)    All other components within normal limits  CBC WITH DIFFERENTIAL/PLATELET - Abnormal; Notable for the following components:   RBC 3.51  (*)    Hemoglobin 12.1 (*)    HCT 33.7 (*)    MCH 34.5 (*)    Platelets 72 (*)    All other components within normal limits  URINALYSIS, COMPLETE (UACMP) WITH MICROSCOPIC - Abnormal; Notable for the following components:   APPearance HAZY (*)    Glucose, UA >=500 (*)    Hgb urine dipstick MODERATE (*)    Leukocytes, UA SMALL (*)    WBC, UA >50 (*)    All other components within normal limits  BLOOD GAS, ARTERIAL - Abnormal; Notable for the following components:   pO2, Arterial 71.4 (*)    All other components  within normal limits  AMMONIA - Abnormal; Notable for the following components:   Ammonia 61 (*)    All other components within normal limits  RAPID URINE DRUG SCREEN, HOSP PERFORMED - Abnormal; Notable for the following components:   Benzodiazepines POSITIVE (*)    All other components within normal limits  CBG MONITORING, ED - Abnormal; Notable for the following components:   Glucose-Capillary 405 (*)    All other components within normal limits  URINE CULTURE  CULTURE, BLOOD (ROUTINE X 2)  CULTURE, BLOOD (ROUTINE X 2)  ETHANOL    EKG None  Radiology Ct Head Wo Contrast  Result Date: 11/08/2017 CLINICAL DATA:  62 year old male with a history of weakness EXAM: CT HEAD WITHOUT CONTRAST TECHNIQUE: Contiguous axial images were obtained from the base of the skull through the vertex without intravenous contrast. COMPARISON:  03/03/2017 FINDINGS: Brain: No acute intracranial hemorrhage. No midline shift or mass effect. Gray-white differentiation maintained. Unremarkable appearance of the ventricular system. Vascular: Unremarkable. Skull: No acute fracture.  No aggressive bone lesion identified. Sinuses/Orbits: Unremarkable appearance of the orbits. Mastoid air cells clear. No middle ear effusion. No significant sinus disease. Other: None IMPRESSION: Negative head CT for acute intracranial abnormality Electronically Signed   By: Corrie Mckusick D.O.   On: 11/08/2017 13:18     Procedures Procedures (including critical care time)  Medications Ordered in ED Medications  sodium chloride 0.9 % bolus 1,000 mL (1,000 mLs Intravenous New Bag/Given 11/08/17 1232)    And  0.9 %  sodium chloride infusion ( Intravenous New Bag/Given 11/08/17 1242)  sodium chloride 0.9 % bolus 1,000 mL (0 mLs Intravenous Stopped 11/08/17 1402)  naloxone (NARCAN) injection 1 mg (1 mg Intravenous Given 11/08/17 1313)  insulin aspart (novoLOG) injection 10 Units (10 Units Subcutaneous Given 11/08/17 1441)  cefTRIAXone (ROCEPHIN) 1 g in sodium chloride 0.9 % 100 mL IVPB (1 g Intravenous New Bag/Given 11/08/17 1441)     Initial Impression / Assessment and Plan / ED Course  I have reviewed the triage vital signs and the nursing notes.  Pertinent labs & imaging results that were available during my care of the patient were reviewed by me and considered in my medical decision making (see chart for details).  Clinical Course as of Nov 08 1509  Fri Nov 08, 2017  1213 Suspect sx are metabolic in nature.  ?drug ingestion, will check ammonia and electrolytes.  Will ct head considering the mental status change   [JK]  1306 Similar to previous results.  Thrombocytopenia is chronic  CBC WITH DIFFERENTIAL(!) [JK]  1424 Insulin ordered for hyperglycemia.  No signs of dka.  Ammonia elevated and benzo positive   [JK]  1425 ?uti with leukocytes.  Will send culture   [QI]  3474 Patient is more alert and he was earlier.  No fever however blood pressure is still in the lower range of normal.   [JK]    Clinical Course User Index [JK] Dorie Rank, MD    Patient presented to the emergency room for recurrent altered mental status.  Patient initially left the hospital this morning he stated for personal reasons.  When I have examined the patient he was very lethargic and listless.  He denied any alcohol or drug use.  ED work-up is notable for possible UTI as well as hyperglycemia without signs of DKA.   Patient's blood pressure has been on the lower end of normal but he is afebrile and no signs of sepsis.  Patient's mental status  has improved somewhat during his stay in the ED.  I benzodiazepine use is a contributing factor.  Considering his UTI and lethargy initially I will consult the medical service to see if they want to bring him back in the hospital as he initially did leave AMA.  Final Clinical Impressions(s) / ED Diagnoses   Final diagnoses:  Hyperglycemia  Altered mental status, unspecified altered mental status type  Urinary tract infection without hematuria, site unspecified      Dorie Rank, MD 11/08/17 [phone]

## 2017-11-08 NOTE — ED Notes (Signed)
Pt given dinner tray.

## 2017-11-08 NOTE — H&P (Signed)
[facility] H&P    Patient Demographics:    Juan Juarez, is a 62 y.o. male  MRN: [id number]  DOB - [birth year]  Admit Date - 11/08/2017  Referring MD/NP/PA: Dr. Tomi Bamberger  Outpatient Primary MD for the patient is Rosita Fire, MD  Patient coming from: Home  Chief complaint-generalized weakness   HPI:    Juan Juarez  is a 63 y.o. male, with history of diabetes mellitus, hepatitis C, depression, polysubstance abuse, thrombocytopenia, hypertension who left AMA earlier this morning.  Patient was admitted to the hospital on 11/07/2017 and told left AMA.  Patient says that he felt generalized weakness and wanted to come back to the hospital.  In the ED he was found to be hyperglycemic with blood glucose of 400 and also found to have abnormal UA.  Patient started on ceftriaxone. He denies any symptoms today.   Denies chest pain or shortness of breath. No nausea vomiting or diarrhea. Denies abdominal pain, dysuria. Denies fever or chills.    Review of systems:      All other systems reviewed and are negative.   With Past History of the following :    Past Medical History:  Diagnosis Date  . Anxiety   . Depression   . Diabetes mellitus, type II ([place])   . Gout   . Hepatitis C without hepatic coma   . Hypertension   . Insomnia   . Non compliance w medication regimen   . Osteoarthritis   . Polysubstance abuse ([place])   . Protein-calorie malnutrition, severe ([place]) 09/13/2016  . Thrombocytopenia ([place])       Past Surgical History:  Procedure Laterality Date  . None        Social History:      Social History   Tobacco Use  . Smoking status: Current Every Day Smoker    Packs/day: 0.50    Years: 50.00    Pack years: 25.00    Types: Cigarettes  . Smokeless tobacco: Never Used  Substance Use Topics  . Alcohol use: Not Currently    Frequency: Never    Comment: quit alcohol Nov 2018; denied 11/08/17         Family History :     Family History  Problem Relation Age of Onset  . Thyroid disease Sister   . Colon cancer Neg Hx   . Colon polyps Neg Hx       Home Medications:   Prior to Admission medications   Medication Sig Start Date End Date Taking? Authorizing Provider  ALPRAZolam Duanne Moron) 1 MG tablet Take 1 mg by mouth at bedtime as needed for anxiety or sleep.   Yes [provider]  doxepin (SINEQUAN) 25 MG capsule Take 25 mg by mouth at bedtime as needed (sleep and anxiety).   Yes [provider]  glipiZIDE (GLUCOTROL) 5 MG tablet Take 1 tablet (5 mg total) by mouth daily before breakfast. 11/08/17  Yes Tat, Shanon Brow, MD  meloxicam (MOBIC) 7.5 MG tablet Take 7.5 mg by mouth as needed for pain.   Yes [provider]  metFORMIN (GLUCOPHAGE) 1000 MG tablet Take 0.5 tablets (500 mg total) by mouth 2 (two) times daily with a meal. Patient taking differently: Take 1,000 mg by mouth 2 (two) times daily with a meal.  05/13/15  Yes Nida, Marella Chimes, MD     Allergies:    No Known Allergies   Physical Exam:   Vitals  Blood pressure 114/74, pulse 63, temperature 97.8 F (36.6 C), temperature source Oral, resp. rate (!) 0, height 5\' 7"  (1.702 m), weight 72.6 kg (160 lb), SpO2 97 %.  1.  General: Appears in no acute distress  2. Psychiatric:  Intact judgement and  insight, awake alert, oriented x 3.  3. Neurologic: No focal neurological deficits, all cranial nerves intact.Strength 5/5 all 4 extremities, sensation intact all 4 extremities, plantars down going.  4. Eyes :  anicteric sclerae, moist conjunctivae with no lid lag. PERRLA.  5. ENMT:  Oropharynx clear with moist mucous membranes and good dentition  6. Neck:  supple, no cervical lymphadenopathy appriciated, No thyromegaly  7. Respiratory : Normal respiratory effort, good air movement bilaterally,clear to  auscultation bilaterally  8. Cardiovascular : RRR, no gallops, rubs or murmurs,  no leg edema  9. Gastrointestinal:  Positive bowel sounds, abdomen soft, non-tender to palpation,no hepatosplenomegaly, no rigidity or guarding       10. Skin:  No cyanosis, normal texture and turgor, no rash, lesions or ulcers  11.Musculoskeletal:  Good muscle tone,  joints appear normal , no effusions,  normal range of motion    Data Review:    CBC Recent Labs  Lab 11/07/17 1930 11/08/17 0441 11/08/17 1217  WBC 5.7 5.6 4.9  HGB 13.3 12.3* 12.1*  HCT 38.1* 34.7* 33.7*  PLT 88* 76* 72*  MCV 97.2 95.9 96.0  MCH 33.9 34.0 34.5*  MCHC 34.9 35.4 35.9  RDW 14.3 14.2 14.4  LYMPHSABS 2.2  --  1.6  MONOABS 0.4  --  0.4  EOSABS 0.1  --  0.1  BASOSABS 0.0  --  0.0   ------------------------------------------------------------------------------------------------------------------  Chemistries  Recent Labs  Lab 11/07/17 1930 11/08/17 0050 11/08/17 0441 11/08/17 1217  NA 124* 132* 134* 131*  K 4.4 3.5 3.1* 3.9  CL 92* 99* 102 100*  CO2 23 24 26 24   GLUCOSE 692* 354* 168* 498*  BUN 24* 19 19 17   CREATININE 0.88 0.74 0.57* 0.74  CALCIUM 8.9 8.3* 8.2* 8.0*  AST 55*  --   --  70*  ALT 49  --   --  47  ALKPHOS 106  --   --  71  BILITOT 1.3*  --   --  1.1   ------------------------------------------------------------------------------------------------------------------  ------------------------------------------------------------------------------------------------------------------ GFR: Estimated Creatinine Clearance: 88.4 mL/min (by C-G formula based on SCr of 0.74 mg/dL). Liver Function Tests: Recent Labs  Lab 11/07/17 1930 11/08/17 1217  AST 55* 70*  ALT 49 47  ALKPHOS 106 71  BILITOT 1.3* 1.1  PROT 7.3 6.3*  ALBUMIN 2.5* 2.1*   No results for input(s): LIPASE, AMYLASE in the last 168 hours. Recent Labs  Lab 11/08/17 1217  AMMONIA 61*   Coagulation Profile: Recent Labs  Lab 11/08/17 0441  INR 1.21   Cardiac Enzymes: No results for input(s):  CKTOTAL, CKMB, CKMBINDEX, TROPONINI in the last 168 hours. BNP (last 3 results) No results for input(s): PROBNP in the last 8760 hours. HbA1C: Recent Labs    11/07/17 2214  HGBA1C 12.4*   CBG: Recent Labs  Lab 11/08/17 0506 11/08/17 6295  11/08/17 0656 11/08/17 0807 11/08/17 1103  GLUCAP 154* 160* 150* 150* 405*   Lipid Profile: No results for input(s): CHOL, HDL, LDLCALC, TRIG, CHOLHDL, LDLDIRECT in the last 72 hours. Thyroid Function Tests: No results for input(s): TSH, T4TOTAL, FREET4, T3FREE, THYROIDAB in the last 72 hours. Anemia Panel: No results for input(s): VITAMINB12, FOLATE, FERRITIN, TIBC, IRON, RETICCTPCT in the last 72 hours.  --------------------------------------------------------------------------------------------------------------- Urine analysis:    Component Value Date/Time   COLORURINE YELLOW 11/08/2017 1313   APPEARANCEUR HAZY (A) 11/08/2017 1313   LABSPEC 1.025 11/08/2017 1313   PHURINE 6.0 11/08/2017 1313   GLUCOSEU >=500 (A) 11/08/2017 1313   HGBUR MODERATE (A) 11/08/2017 1313   BILIRUBINUR NEGATIVE 11/08/2017 1313   KETONESUR NEGATIVE 11/08/2017 1313   PROTEINUR NEGATIVE 11/08/2017 1313   UROBILINOGEN 0.2 07/01/2014 2244   NITRITE NEGATIVE 11/08/2017 1313   LEUKOCYTESUR SMALL (A) 11/08/2017 1313      Imaging Results:    Ct Head Wo Contrast  Result Date: 11/08/2017 CLINICAL DATA:  62 year old male with a history of weakness EXAM: CT HEAD WITHOUT CONTRAST TECHNIQUE: Contiguous axial images were obtained from the base of the skull through the vertex without intravenous contrast. COMPARISON:  03/03/2017 FINDINGS: Brain: No acute intracranial hemorrhage. No midline shift or mass effect. Gray-white differentiation maintained. Unremarkable appearance of the ventricular system. Vascular: Unremarkable. Skull: No acute fracture.  No aggressive bone lesion identified. Sinuses/Orbits: Unremarkable appearance of the orbits. Mastoid air cells clear. No  middle ear effusion. No significant sinus disease. Other: None IMPRESSION: Negative head CT for acute intracranial abnormality Electronically Signed   By: Corrie Mckusick D.O.   On: 11/08/2017 13:18      Assessment & Plan:    Active Problems:   Diabetes mellitus with hyperglycemia (HCC)   Thrombocytopenia ([facility])   UTI (urinary tract infection)   1. UTI-patient has a normal UA, urine culture has been ordered.  Patient started on ceftriaxone.  Will continue with ceftriaxone per pharmacy consultation.  Follow urine culture results. 2. Uncontrolled diabetes mellitus-patient's last hemoglobin A1c was 12.4.  Will start Lantus 10 units subcu daily at bedtime, moderate sliding scale insulin with NovoLog.  We will hold oral hypoglycemic agents at this time. 3. 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  Will consult hematology for further recommendations. 4. Depression/anxiety-continue home medications alprazolam, doxepin 5.    DVT Prophylaxis-   SCds  AM Labs Ordered, also please review Full Orders  Family Communication: Admission, patients condition and plan of care including tests being ordered have been discussed with the patient  who indicate understanding and agree with the plan and Code Status.  Code Status:   full code  Admission status: Inpatient  Time spent in minutes : 60 minutes   Oswald Hillock M.D on 11/08/2017 at 4:04 PM  Between 7am to 7pm - Pager - [phone]. After 7pm go to [email or web address] - password [facility]  [facility] - Office  [phone]

## 2017-11-08 NOTE — ED Provider Notes (Signed)
[facility] Provider Note   CSN: [id number] Arrival date & time: 11/07/17  1853     History   Chief Complaint Chief Complaint  Patient presents with  . Hyperglycemia    HPI Juan Juarez is a 62 y.o. male.  HPI Patient presents with hyperglycemia.  He is on metformin 1000 mg twice a day.  States he has been taking it but not being good with his diet.  States he has not checked his blood sugar in a couple months.  States that he has been getting up around 25 times a night to urinate.  States he has lost some weight.  No fevers.  Feels a little dizzy.  No abdominal pain.  History of cirrhosis. Past Medical History:  Diagnosis Date  . Anxiety   . Depression   . Diabetes mellitus, type II ([place])   . Gout   . Hepatitis C without hepatic coma   . Hypertension   . Insomnia   . Non compliance w medication regimen   . Osteoarthritis   . Polysubstance abuse ([place])   . Protein-calorie malnutrition, severe ([place]) 09/13/2016  . Thrombocytopenia [facility])     Patient Active Problem List   Diagnosis Date Noted  . Diabetic hyperosmolar non-ketotic state ([place]) 11/07/2017  . Osteoarthritis 11/07/2017  . Tobacco use 11/07/2017  . Hepatic cirrhosis ([place]) 10/23/2017  . Nausea and vomiting in adult   . Hepatitis C virus infection without hepatic coma   . Splenomegaly   . Hypoalbuminemia   . Vomiting 05/01/2017  . Dehydration 05/01/2017  . Hyponatremia 05/01/2017  . Cocaine abuse ([place]) 05/01/2017  . Hyperglycemia 09/13/2016  . Polysubstance abuse ([place]) 09/13/2016  . Elevated LFTs 09/13/2016  . Protein-calorie malnutrition, severe ([place]) 09/13/2016  . Depression 09/13/2016  . Thrombocytopenia ([place]) 09/13/2016  . Diabetes mellitus with hyperglycemia ([place]) 05/13/2015  . Essential hypertension, benign 05/13/2015  . Smoker 05/13/2015  . Back pain at L4-L5 level 11/05/2013  . Neck pain 11/05/2013  . Cervical spondylosis 11/05/2013  . Spinal stenosis of lumbar region  11/05/2013    Past Surgical History:  Procedure Laterality Date  . None          Home Medications    Prior to Admission medications   Medication Sig Start Date End Date Taking? Authorizing Provider  alprazolam Duanne Moron) 2 MG tablet Take 2 mg by mouth daily as needed for sleep.  09/05/16  Yes [provider]  doxepin (SINEQUAN) 10 MG capsule Take 10 mg by mouth daily as needed (depression).    Yes [provider]  meloxicam (MOBIC) 7.5 MG tablet Take 7.5 mg by mouth as needed for pain.   Yes [provider]  metFORMIN (GLUCOPHAGE) 1000 MG tablet Take 0.5 tablets (500 mg total) by mouth 2 (two) times daily with a meal. Patient taking differently: Take 1,000 mg by mouth 2 (two) times daily with a meal.  05/13/15  Yes Nida, Marella Chimes, MD    Family History Family History  Problem Relation Age of Onset  . Thyroid disease Sister   . Colon cancer Neg Hx   . Colon polyps Neg Hx     Social History Social History   Tobacco Use  . Smoking status: Current Every Day Smoker    Packs/day: 0.50    Years: 50.00    Pack years: 25.00    Types: Cigarettes  . Smokeless tobacco: Never Used  Substance Use Topics  . Alcohol use: Not Currently  Frequency: Never    Comment: quit alcohol Nov 2018; denied 10/23/17  . Drug use: No    Types: Marijuana, Cocaine    Comment: 07/29/17-states he quit; denied 10/23/17     Allergies   Patient has no known allergies.   Review of Systems Review of Systems  Constitutional: Positive for unexpected weight change. Negative for appetite change.  HENT: Negative for congestion.   Respiratory: Negative for shortness of breath.   Cardiovascular: Negative for chest pain.  Gastrointestinal: Negative for abdominal pain.  Endocrine: Positive for polydipsia, polyphagia and polyuria.  Genitourinary: Negative for flank pain.  Musculoskeletal: Negative for back pain.  Skin: Negative for rash.  Neurological: Positive for  light-headedness.  Hematological: Negative for adenopathy.  Psychiatric/Behavioral: Negative for confusion.     Physical Exam Updated Vital Signs BP 131/86   Pulse 75   Temp 98.2 F (36.8 C) (Oral)   Resp 18   Ht 5\' 7"  (1.702 m)   Wt 68.3 kg (150 lb 9.2 oz)   SpO2 99%   BMI 23.58 kg/m   Physical Exam  Constitutional: He appears well-developed.  HENT:  Mucous membranes are dry  Eyes: Pupils are equal, round, and reactive to light.  Neck: Neck supple.  Cardiovascular: Normal rate.  Pulmonary/Chest: Effort normal.  Abdominal: There is no tenderness.  Musculoskeletal: He exhibits no edema.  Neurological: He is alert.  Skin: Skin is warm. Capillary refill takes less than 2 seconds.     ED Treatments / Results  Labs (all labs ordered are listed, but only abnormal results are displayed) Labs Reviewed  BLOOD GAS, VENOUS - Abnormal; Notable for the following components:      Result Value   pCO2, Ven 38.4 (*)    pO2, Ven 106.0 (*)    All other components within normal limits  COMPREHENSIVE METABOLIC PANEL - Abnormal; Notable for the following components:   Sodium 124 (*)    Chloride 92 (*)    Glucose, Bld 692 (*)    BUN 24 (*)    Albumin 2.5 (*)    AST 55 (*)    Total Bilirubin 1.3 (*)    All other components within normal limits  CBC WITH DIFFERENTIAL/PLATELET - Abnormal; Notable for the following components:   RBC 3.92 (*)    HCT 38.1 (*)    Platelets 88 (*)    All other components within normal limits  URINALYSIS, ROUTINE W REFLEX MICROSCOPIC - Abnormal; Notable for the following components:   Color, Urine STRAW (*)    Glucose, UA >=500 (*)    Hgb urine dipstick SMALL (*)    All other components within normal limits  GLUCOSE, CAPILLARY - Abnormal; Notable for the following components:   Glucose-Capillary 434 (*)    All other components within normal limits  GLUCOSE, CAPILLARY - Abnormal; Notable for the following components:   Glucose-Capillary 333 (*)     All other components within normal limits  CBG MONITORING, ED - Abnormal; Notable for the following components:   Glucose-Capillary >600 (*)    All other components within normal limits  MRSA PCR SCREENING  BASIC METABOLIC PANEL  BASIC METABOLIC PANEL  BASIC METABOLIC PANEL  CBC  PROTIME-INR  HEMOGLOBIN A1C    EKG None  Radiology No results found.  Procedures Procedures (including critical care time)  Medications Ordered in ED Medications  dextrose 5 %-0.45 % sodium chloride infusion (has no administration in time range)  insulin regular (NOVOLIN R,HUMULIN R) 100 Units in sodium chloride  0.9 % 100 mL (1 Units/mL) infusion (5.4 Units/hr Intravenous Transfusing/Transfer 11/07/17 2154)  ALPRAZolam (XANAX) tablet 2 mg (2 mg Oral Given 11/07/17 2300)  dextrose 5 %-0.45 % sodium chloride infusion (has no administration in time range)  insulin regular bolus via infusion 0-10 Units (has no administration in time range)  insulin regular (NOVOLIN R,HUMULIN R) 100 Units in sodium chloride 0.9 % 100 mL (1 Units/mL) infusion (has no administration in time range)  dextrose 50 % solution 25 mL (has no administration in time range)  0.45 % sodium chloride infusion ( Intravenous New Bag/Given 11/07/17 2304)  ondansetron (ZOFRAN) tablet 4 mg (has no administration in time range)    Or  ondansetron (ZOFRAN) injection 4 mg (has no administration in time range)  acetaminophen (TYLENOL) tablet 650 mg (has no administration in time range)    Or  acetaminophen (TYLENOL) suppository 650 mg (has no administration in time range)  doxepin (SINEQUAN) capsule 10 mg (10 mg Oral Given 11/07/17 2300)  metFORMIN (GLUCOPHAGE) tablet 1,000 mg (has no administration in time range)  living well with diabetes book MISC (has no administration in time range)  sodium chloride 0.9 % bolus 1,000 mL (0 mLs Intravenous Stopped 11/07/17 2011)  sodium chloride 0.9 % bolus 1,000 mL (0 mLs Intravenous Stopped 11/07/17 2141)      Initial Impression / Assessment and Plan / ED Course  I have reviewed the triage vital signs and the nursing notes.  Pertinent labs & imaging results that were available during my care of the patient were reviewed by me and considered in my medical decision making (see chart for details).     Patient presents with hyperglycemia.  Sugar of 700.  Not in DKA.  However with the severe hyperglycemia I feel the patient benefit from overnight observation for fluid resuscitation, control of his hyperglycemia and adjustment of his medications.  Admitted to hospitalist.  Final Clinical Impressions(s) / ED Diagnoses   Final diagnoses:  Hyperglycemia    ED Discharge Orders    None       Davonna Belling, MD 11/08/17 [phone]

## 2017-11-08 NOTE — Progress Notes (Signed)
Pt stated that he needed to go because his RV was being moved and that he wanted something to eat. Explained to pt that I would get him a diet order asap. Disconnected from Insulin drip and IV fluids. Pt stated that he was leaving and that if he had to rip all these cords off and walk out he would. He decided to leave AMA. This nurse tried to explain to the pt that his BG is not stable and that if he leaves he will probably have to come back due to DM. Dr. Carles Collet paged and came to bedside wrote orders for Lantus but pt did not wait long enough to take lantus. Pt did not wait for IV to be covered and bled all over the floor. This nurse wrapped pt's arm with Kerlix and tape. Pt signed [facility] paperwork and left the floor.

## 2017-11-09 DIAGNOSIS — F1910 Other psychoactive substance abuse, uncomplicated: Secondary | ICD-10-CM

## 2017-11-09 DIAGNOSIS — K7460 Unspecified cirrhosis of liver: Secondary | ICD-10-CM

## 2017-11-09 DIAGNOSIS — G9341 Metabolic encephalopathy: Secondary | ICD-10-CM

## 2017-11-09 DIAGNOSIS — E7220 Disorder of urea cycle metabolism, unspecified: Secondary | ICD-10-CM

## 2017-11-09 DIAGNOSIS — B182 Chronic viral hepatitis C: Secondary | ICD-10-CM

## 2017-11-09 DIAGNOSIS — E871 Hypo-osmolality and hyponatremia: Secondary | ICD-10-CM

## 2017-11-09 DIAGNOSIS — E1100 Type 2 diabetes mellitus with hyperosmolarity without nonketotic hyperglycemic-hyperosmolar coma (NKHHC): Principal | ICD-10-CM

## 2017-11-09 LAB — CBC
HCT: 34.9 % — ABNORMAL LOW (ref 39.0–52.0)
HEMOGLOBIN: 12.4 g/dL — AB (ref 13.0–17.0)
MCH: 34.5 pg — AB (ref 26.0–34.0)
MCHC: 35.5 g/dL (ref 30.0–36.0)
MCV: 97.2 fL (ref 78.0–100.0)
Platelets: 72 10*3/uL — ABNORMAL LOW (ref 150–400)
RBC: 3.59 MIL/uL — ABNORMAL LOW (ref 4.22–5.81)
RDW: 14.6 % (ref 11.5–15.5)
WBC: 4.7 10*3/uL (ref 4.0–10.5)

## 2017-11-09 LAB — COMPREHENSIVE METABOLIC PANEL
ALK PHOS: 62 U/L (ref 38–126)
ALT: 51 U/L (ref 17–63)
ANION GAP: 5 (ref 5–15)
AST: 81 U/L — ABNORMAL HIGH (ref 15–41)
Albumin: 2 g/dL — ABNORMAL LOW (ref 3.5–5.0)
BUN: 15 mg/dL (ref 6–20)
CALCIUM: 7.8 mg/dL — AB (ref 8.9–10.3)
CO2: 26 mmol/L (ref 22–32)
Chloride: 105 mmol/L (ref 101–111)
Creatinine, Ser: 0.67 mg/dL (ref 0.61–1.24)
GFR calc non Af Amer: >60 mL/min (ref >60)
Glucose, Bld: 324 mg/dL — ABNORMAL HIGH (ref 65–99)
Potassium: 3.7 mmol/L (ref 3.5–5.1)
SODIUM: 136 mmol/L (ref 135–145)
TOTAL PROTEIN: 5.9 g/dL — AB (ref 6.5–8.1)
Total Bilirubin: 1.1 mg/dL (ref 0.3–1.2)

## 2017-11-09 LAB — GLUCOSE, CAPILLARY
GLUCOSE-CAPILLARY: 360 mg/dL — AB (ref 65–99)
Glucose-Capillary: 318 mg/dL — ABNORMAL HIGH (ref 65–99)
Glucose-Capillary: 322 mg/dL — ABNORMAL HIGH (ref 65–99)
Glucose-Capillary: 351 mg/dL — ABNORMAL HIGH (ref 65–99)

## 2017-11-09 MED ORDER — INSULIN ASPART 100 UNIT/ML ~~LOC~~ SOLN
0.0000 [IU] | Freq: Three times a day (TID) | SUBCUTANEOUS | Status: DC
  Administered 2017-11-09: 11 [IU] via SUBCUTANEOUS
  Administered 2017-11-09: 15 [IU] via SUBCUTANEOUS

## 2017-11-09 MED ORDER — INSULIN GLARGINE 100 UNIT/ML ~~LOC~~ SOLN
20.0000 [IU] | Freq: Every day | SUBCUTANEOUS | Status: DC
  Administered 2017-11-09: 20 [IU] via SUBCUTANEOUS
  Filled 2017-11-09 (×3): qty 0.2

## 2017-11-09 MED ORDER — INSULIN ASPART 100 UNIT/ML ~~LOC~~ SOLN
0.0000 [IU] | Freq: Every day | SUBCUTANEOUS | Status: DC
  Administered 2017-11-09: 5 [IU] via SUBCUTANEOUS

## 2017-11-09 NOTE — Consult Note (Signed)
I have reviewed his labs. He has chronic stable thrombocytopenia since 2015, likely related to Chronic Hep C, Cirrhosis and splenomegaly. I spoke with Dr. Carles Collet, and confirmed that he is not actively bleeding. Since this is a chronic problem, we will be glad to see him as an outpatient in our office. Upon discharge, please make an office appointment.   Juan Jack, MD [phone]

## 2017-11-09 NOTE — Progress Notes (Signed)
PROGRESS NOTE  Juan Juarez WER:[id number] DOB: 1956-06-16 DOA: 11/08/2017 PCP: Rosita Fire, MD  Brief History:  62 year old male with a history of polysubstance abuse, diabetes mellitus, chronic hepatitis C, anxiety/depression presented with altered mental status.  The patient was previously admitted to the hospital on 11/07/2017 for hyperosmolar state.  He left AGAINST MEDICAL ADVICE in the early morning 11/08/2017.  At that time, the patient stated that he had to move his RV or else he would lose it.  When asked why he returned, the patient stated "I just felt bad, and I was staggering and could not walk straight."  The patient denied any illicit drug use.  He states that he has not used cocaine for over 5 months nor drinking alcohol for 6 months.  However reviewed the medical record shows that he last drank alcohol in January 2019.  He previously used cannabis, but has not smoked for nearly 6 months. Patient denies fevers, chills, headache, chest pain, dyspnea, nausea, vomiting, diarrhea, abdominal pain, dysuria, hematuria, hematochezia, and melena. In the emergency department, the patient was lethargic and there was difficulty obtaining history.  He was noted to have serum glucose 498 with anion gap of 7.  Patient will start IV fluids.  Urinalysis showed >50 WBC.  He was started on IV ceftriaxone.  Ammonia was 61.  Assessment/Plan: Acute metabolic encephalopathy -Multifactorial including UTI and hyperosmolar state -Mental status improving with antibiotics and IV fluids  Hyperosmolar nonketotic state -Improved with IV fluids -11/07/2017 hemoglobin A1c 12.4 -Continue IV fluids  Diabetes mellitus type 2, uncontrolled -11/07/2017 hemoglobin A1c 12.4 -Increase Lantus to 20 units daily -NovoLog sliding scale -Patient will need to be discharged with insulin.  Insulin education provided; however, patient appears to have poor health literacy  Pyuria -Continue ceftriaxone pending culture  data  Hyperammonemia -Uncertain clinical significance as the patient's mental status is improved -Monitor clinically  Polysubstance abuse, in remission -Previously used alcohol, cocaine, THC  Chronic hepatitis C -Patient follows with [facility] -Outpatient GI follow-up  Thrombocytopenia -Secondary to chronic liver disease and alcohol     Disposition Plan:   Home in 1-2 days  Family Communication:   No Family at bedside  Consultants:  none  Code Status:  FULL   DVT Prophylaxis:  SCDs   Procedures: As Listed in Progress Note Above  Antibiotics: None    Subjective: Patient denies fevers, chills, headache, chest pain, dyspnea, nausea, vomiting, diarrhea, abdominal pain, dysuria, hematuria, hematochezia, and melena.   Objective: Vitals:   11/08/17 1630 11/08/17 1706 11/08/17 2131 11/09/17 0523  BP: 124/79 118/76 116/78 118/66  Pulse:  67 68 65  Resp: 20 18 18 19   Temp:  (!) 97.5 F (36.4 C) 98.4 F (36.9 C)   TempSrc:  Oral Oral   SpO2:  100% 98% 97%  Weight:  72.6 kg (160 lb)    Height:  5\' 7"  (1.702 m)      Intake/Output Summary (Last 24 hours) at 11/09/2017 0843 Last data filed at 11/09/2017 0500 Gross per 24 hour  Intake 3004.34 ml  Output 3350 ml  Net -345.66 ml   Weight change:  Exam:   General:  Pt is alert, follows commands appropriately, not in acute distress  HEENT: No icterus, No thrush, No neck mass, [place]/AT  Cardiovascular: RRR, S1/S2, no rubs, no gallops  Respiratory: CTA bilaterally, no wheezing, no crackles, no rhonchi  Abdomen: Soft/+BS, non tender, non distended, no guarding  Extremities:  No edema, No lymphangitis, No petechiae, No rashes, no synovitis   Data Reviewed: I have personally reviewed following labs and imaging studies Basic Metabolic Panel: Recent Labs  Lab 11/07/17 1930 11/08/17 0050 11/08/17 0441 11/08/17 1217 11/09/17 0605  NA 124* 132* 134* 131* 136  K 4.4 3.5 3.1* 3.9 3.7  CL 92* 99* 102 100* 105    CO2 23 24 26 24 26   GLUCOSE 692* 354* 168* 498* 324*  BUN 24* 19 19 17 15   CREATININE 0.88 0.74 0.57* 0.74 0.67  CALCIUM 8.9 8.3* 8.2* 8.0* 7.8*   Liver Function Tests: Recent Labs  Lab 11/07/17 1930 11/08/17 1217 11/09/17 0605  AST 55* 70* 81*  ALT 49 47 51  ALKPHOS 106 71 62  BILITOT 1.3* 1.1 1.1  PROT 7.3 6.3* 5.9*  ALBUMIN 2.5* 2.1* 2.0*   No results for input(s): LIPASE, AMYLASE in the last 168 hours. Recent Labs  Lab 11/08/17 1217  AMMONIA 61*   Coagulation Profile: Recent Labs  Lab 11/08/17 0441  INR 1.21   CBC: Recent Labs  Lab 11/07/17 1930 11/08/17 0441 11/08/17 1217 11/09/17 0605  WBC 5.7 5.6 4.9 4.7  NEUTROABS 3.0  --  2.8  --   HGB 13.3 12.3* 12.1* 12.4*  HCT 38.1* 34.7* 33.7* 34.9*  MCV 97.2 95.9 96.0 97.2  PLT 88* 76* 72* 72*   Cardiac Enzymes: No results for input(s): CKTOTAL, CKMB, CKMBINDEX, TROPONINI in the last 168 hours. BNP: Invalid input(s): POCBNP CBG: Recent Labs  Lab 11/08/17 0807 11/08/17 1103 11/08/17 1643 11/08/17 2140 11/09/17 0743  GLUCAP 150* 405* 458* 439* 318*   HbA1C: Recent Labs    11/07/17 2214  HGBA1C 12.4*   Urine analysis:    Component Value Date/Time   COLORURINE YELLOW 11/08/2017 1313   APPEARANCEUR HAZY (A) 11/08/2017 1313   LABSPEC 1.025 11/08/2017 1313   PHURINE 6.0 11/08/2017 1313   GLUCOSEU >=500 (A) 11/08/2017 1313   HGBUR MODERATE (A) 11/08/2017 1313   BILIRUBINUR NEGATIVE 11/08/2017 1313   [place] 11/08/2017 1313   PROTEINUR NEGATIVE 11/08/2017 1313   UROBILINOGEN 0.2 07/01/2014 2244   NITRITE NEGATIVE 11/08/2017 1313   LEUKOCYTESUR SMALL (A) 11/08/2017 1313   Sepsis Labs: @LABRCNTIP (procalcitonin:4,lacticidven:4) ) Recent Results (from the past 240 hour(s))  MRSA PCR Screening     Status: Abnormal   Collection Time: 11/07/17 10:03 PM  Result Value Ref Range Status   MRSA by PCR POSITIVE (A) NEGATIVE Final    Comment:        The GeneXpert MRSA Assay (FDA approved  for NASAL specimens only), is one component of a comprehensive MRSA colonization surveillance program. It is not intended to diagnose MRSA infection nor to guide or monitor treatment for MRSA infections. RESULT CALLED TO, READ BACK BY AND VERIFIED WITH: WAGGONER,R AT 0623 BY HUFFINES,S ON 11/08/17. Performed at [facility], [address] [place], [place] [address]   Blood culture (routine x 2)     Status: None (Preliminary result)   Collection Time: 11/08/17  2:40 PM  Result Value Ref Range Status   Specimen Description   Final    LEFT ANTECUBITAL BOTTLES DRAWN AEROBIC AND ANAEROBIC   Special Requests Blood Culture adequate volume  Final   Culture   Final    NO GROWTH < 24 HOURS Performed at [facility], [address] [place], [place] [address]    Report Status PENDING  Incomplete  Blood culture (routine x 2)     Status: None (Preliminary result)  Collection Time: 11/08/17  2:45 PM  Result Value Ref Range Status   Specimen Description   Final    BLOOD LEFT HAND BOTTLES DRAWN AEROBIC AND ANAEROBIC   Special Requests Blood Culture adequate volume  Final   Culture   Final    NO GROWTH < 24 HOURS Performed at [facility], [address], [place], [place] [address]    Report Status PENDING  Incomplete     Scheduled Meds: . Chlorhexidine Gluconate Cloth  6 each Topical Q0600  . insulin aspart  0-15 Units Subcutaneous TID WC  . insulin aspart  0-5 Units Subcutaneous QHS  . insulin glargine  20 Units Subcutaneous Daily  . mupirocin ointment  1 application Nasal BID   Continuous Infusions: . sodium chloride 10 mL/hr at 11/08/17 1714  . cefTRIAXone (ROCEPHIN)  IV      Procedures/Studies: Ct Head Wo Contrast  Result Date: 11/08/2017 CLINICAL DATA:  62 year old male with a history of weakness EXAM: CT HEAD WITHOUT CONTRAST TECHNIQUE: Contiguous axial images were obtained from the base of the skull through the vertex without intravenous contrast. COMPARISON:  03/03/2017  FINDINGS: Brain: No acute intracranial hemorrhage. No midline shift or mass effect. Gray-white differentiation maintained. Unremarkable appearance of the ventricular system. Vascular: Unremarkable. Skull: No acute fracture.  No aggressive bone lesion identified. Sinuses/Orbits: Unremarkable appearance of the orbits. Mastoid air cells clear. No middle ear effusion. No significant sinus disease. Other: None IMPRESSION: Negative head CT for acute intracranial abnormality Electronically Signed   By: Corrie Mckusick D.O.   On: 11/08/2017 13:18    Orson Eva, DO  [facility] Pager [phone]  If 7PM-7AM, please contact night-coverage [email or web address] Password [email or web address] 11/09/2017, 8:43 AM   LOS: 1 day

## 2017-11-10 DIAGNOSIS — Z794 Long term (current) use of insulin: Secondary | ICD-10-CM

## 2017-11-10 DIAGNOSIS — B1920 Unspecified viral hepatitis C without hepatic coma: Secondary | ICD-10-CM

## 2017-11-10 DIAGNOSIS — E0865 Diabetes mellitus due to underlying condition with hyperglycemia: Secondary | ICD-10-CM

## 2017-11-10 LAB — MAGNESIUM: Magnesium: 1.5 mg/dL — ABNORMAL LOW (ref 1.7–2.4)

## 2017-11-10 LAB — URINE CULTURE: CULTURE: NO GROWTH

## 2017-11-10 LAB — BASIC METABOLIC PANEL
Anion gap: 4 — ABNORMAL LOW (ref 5–15)
BUN: 16 mg/dL (ref 6–20)
CHLORIDE: 104 mmol/L (ref 101–111)
CO2: 24 mmol/L (ref 22–32)
CREATININE: 0.75 mg/dL (ref 0.61–1.24)
Calcium: 7.8 mg/dL — ABNORMAL LOW (ref 8.9–10.3)
GFR calc Af Amer: >60 mL/min (ref >60)
GFR calc non Af Amer: >60 mL/min (ref >60)
GLUCOSE: 442 mg/dL — AB (ref 65–99)
POTASSIUM: 3.9 mmol/L (ref 3.5–5.1)
Sodium: 132 mmol/L — ABNORMAL LOW (ref 135–145)

## 2017-11-10 LAB — GLUCOSE, CAPILLARY
GLUCOSE-CAPILLARY: 411 mg/dL — AB (ref 65–99)
GLUCOSE-CAPILLARY: 263 mg/dL — AB (ref 65–99)
Glucose-Capillary: 344 mg/dL — ABNORMAL HIGH (ref 65–99)
Glucose-Capillary: 292 mg/dL — ABNORMAL HIGH (ref 65–99)

## 2017-11-10 MED ORDER — INSULIN ASPART 100 UNIT/ML ~~LOC~~ SOLN
0.0000 [IU] | Freq: Every day | SUBCUTANEOUS | Status: DC
  Administered 2017-11-10: 3 [IU] via SUBCUTANEOUS

## 2017-11-10 MED ORDER — INSULIN ASPART 100 UNIT/ML ~~LOC~~ SOLN
5.0000 [IU] | Freq: Three times a day (TID) | SUBCUTANEOUS | Status: DC
  Administered 2017-11-10: 5 [IU] via SUBCUTANEOUS

## 2017-11-10 MED ORDER — INSULIN ASPART 100 UNIT/ML ~~LOC~~ SOLN
25.0000 [IU] | Freq: Once | SUBCUTANEOUS | Status: AC
  Administered 2017-11-10: 25 [IU] via SUBCUTANEOUS

## 2017-11-10 MED ORDER — INSULIN ASPART 100 UNIT/ML ~~LOC~~ SOLN
0.0000 [IU] | Freq: Three times a day (TID) | SUBCUTANEOUS | Status: DC
  Administered 2017-11-10: 11 [IU] via SUBCUTANEOUS
  Administered 2017-11-10: 15 [IU] via SUBCUTANEOUS
  Administered 2017-11-11: 20 [IU] via SUBCUTANEOUS

## 2017-11-10 MED ORDER — INSULIN GLARGINE 100 UNIT/ML ~~LOC~~ SOLN
45.0000 [IU] | Freq: Every day | SUBCUTANEOUS | Status: DC
  Administered 2017-11-10: 45 [IU] via SUBCUTANEOUS
  Filled 2017-11-10 (×2): qty 0.45

## 2017-11-10 MED ORDER — INSULIN ASPART 100 UNIT/ML ~~LOC~~ SOLN: 20.0000 [IU] | Freq: Once | SUBCUTANEOUS | Status: DC

## 2017-11-10 NOTE — Progress Notes (Signed)
PROGRESS NOTE  Juan Juarez DTO:[id number] DOB: [birth year] DOA: 11/08/2017 PCP: Rosita Fire, MD  Brief History:  62 year old male with a history of polysubstance abuse, diabetes mellitus, chronic hepatitis C, anxiety/depression presented with altered mental status.  The patient was previously admitted to the hospital on 11/07/2017 for hyperosmolar state.  He left AGAINST MEDICAL ADVICE in the early morning 11/08/2017.  At that time, the patient stated that he had to move his RV or else he would lose it.  When asked why he returned, the patient stated "I just felt bad, and I was staggering and could not walk straight."  The patient denied any illicit drug use.  He states that he has not used cocaine for over 5 months nor drinking alcohol for 6 months.  However reviewed the medical record shows that he last drank alcohol in January 2019.  He previously used cannabis, but has not smoked for nearly 6 months. Patient denies fevers, chills, headache, chest pain, dyspnea, nausea, vomiting, diarrhea, abdominal pain, dysuria, hematuria, hematochezia, and melena. In the emergency department, the patient was lethargic and there was difficulty obtaining history.  He was noted to have serum glucose 498 with anion gap of 7.  Patient will start IV fluids.  Urinalysis showed >50 WBC.  He was started on IV ceftriaxone.  Ammonia was 61.  Assessment/Plan: Acute metabolic encephalopathy -Multifactorial including UTI and hyperosmolar state -Mental status improving with antibiotics and IV fluids -back to baseline  Hyperosmolar nonketotic state -Improved with IV fluids -11/07/2017 hemoglobin A1c 12.4 -now tolerating diet  Diabetes mellitus type 2, uncontrolled -11/07/2017 hemoglobin A1c 12.4 -CBGs remain in 400s -Increase Lantus to 45 units daily -add novolog 5 units with meals -NovoLog sliding scale--increase to resistant scale -Patient will need to be discharged with insulin.  Insulin education  provided; however, patient appears to have poor health literacy  Pyuria -received 3 days of ceftriaxone -urine culture neg -d/c ceftriaxone  Hyperammonemia -Uncertain clinical significance as the patient's mental status is improved -Monitor clinically  Polysubstance abuse, in remission -Previously used alcohol, cocaine, THC  Chronic hepatitis C -Patient follows with [facility] -Outpatient GI follow-up  Thrombocytopenia -Secondary to chronic liver disease and alcohol     Disposition Plan:   Home 5/13 if stable Family Communication:   No Family at bedside  Consultants:  none  Code Status:  FULL   DVT Prophylaxis:  SCDs   Procedures: As Listed in Progress Note Above  Antibiotics: None      Subjective: Patient denies fevers, chills, headache, chest pain, dyspnea, nausea, vomiting, diarrhea, abdominal pain, dysuria, hematuria, hematochezia, and melena.   Objective: Vitals:   11/09/17 1346 11/09/17 2051 11/10/17 0553 11/10/17 1329  BP: 104/66 114/73 104/65 111/78  Pulse: 67 75 68 77  Resp: 14 16 16 16   Temp: (!) 97.4 F (36.3 C)  98.4 F (36.9 C) 98.3 F (36.8 C)  TempSrc: Oral   Oral  SpO2: 95% 96% 99% 98%  Weight:      Height:        Intake/Output Summary (Last 24 hours) at 11/10/2017 1513 Last data filed at 11/10/2017 1000 Gross per 24 hour  Intake 540 ml  Output 1050 ml  Net -510 ml   Weight change:  Exam:   General:  Pt is alert, follows commands appropriately, not in acute distress  HEENT: No icterus, No thrush, No neck mass, [place]/AT  Cardiovascular: RRR, S1/S2, no rubs, no gallops  Respiratory:  CTA bilaterally, no wheezing, no crackles, no rhonchi  Abdomen: Soft/+BS, non tender, non distended, no guarding  Extremities: No edema, No lymphangitis, No petechiae, No rashes, no synovitis   Data Reviewed: I have personally reviewed following labs and imaging studies Basic Metabolic Panel: Recent Labs  Lab  11/08/17 0050 11/08/17 0441 11/08/17 1217 11/09/17 0605 11/10/17 0851  NA 132* 134* 131* 136 132*  K 3.5 3.1* 3.9 3.7 3.9  CL 99* 102 100* 105 104  CO2 24 26 24 26 24   GLUCOSE 354* 168* 498* 324* 442*  BUN 19 19 17 15 16   CREATININE 0.74 0.57* 0.74 0.67 0.75  CALCIUM 8.3* 8.2* 8.0* 7.8* 7.8*  MG  --   --   --   --  1.5*   Liver Function Tests: Recent Labs  Lab 11/07/17 1930 11/08/17 1217 11/09/17 0605  AST 55* 70* 81*  ALT 49 47 51  ALKPHOS 106 71 62  BILITOT 1.3* 1.1 1.1  PROT 7.3 6.3* 5.9*  ALBUMIN 2.5* 2.1* 2.0*   No results for input(s): LIPASE, AMYLASE in the last 168 hours. Recent Labs  Lab 11/08/17 1217  AMMONIA 61*   Coagulation Profile: Recent Labs  Lab 11/08/17 0441  INR 1.21   CBC: Recent Labs  Lab 11/07/17 1930 11/08/17 0441 11/08/17 1217 11/09/17 0605  WBC 5.7 5.6 4.9 4.7  NEUTROABS 3.0  --  2.8  --   HGB 13.3 12.3* 12.1* 12.4*  HCT 38.1* 34.7* 33.7* 34.9*  MCV 97.2 95.9 96.0 97.2  PLT 88* 76* 72* 72*   Cardiac Enzymes: No results for input(s): CKTOTAL, CKMB, CKMBINDEX, TROPONINI in the last 168 hours. BNP: Invalid input(s): POCBNP CBG: Recent Labs  Lab 11/09/17 1118 11/09/17 1602 11/09/17 2050 11/10/17 0759 11/10/17 1123  GLUCAP 360* 322* 351* 411* 344*   HbA1C: Recent Labs    11/07/17 2214  HGBA1C 12.4*   Urine analysis:    Component Value Date/Time   COLORURINE YELLOW 11/08/2017 1313   APPEARANCEUR HAZY (A) 11/08/2017 1313   LABSPEC 1.025 11/08/2017 1313   PHURINE 6.0 11/08/2017 1313   GLUCOSEU >=500 (A) 11/08/2017 1313   HGBUR MODERATE (A) 11/08/2017 1313   BILIRUBINUR NEGATIVE 11/08/2017 1313   [place] 11/08/2017 1313   PROTEINUR NEGATIVE 11/08/2017 1313   UROBILINOGEN 0.2 07/01/2014 2244   NITRITE NEGATIVE 11/08/2017 1313   LEUKOCYTESUR SMALL (A) 11/08/2017 1313   Sepsis Labs: @LABRCNTIP (procalcitonin:4,lacticidven:4) ) Recent Results (from the past 240 hour(s))  Urine Culture     Status: None    Collection Time: 11/07/17  8:46 PM  Result Value Ref Range Status   Specimen Description   Final    URINE, CLEAN CATCH Performed at [facility], [address] [place], [place] [address]    Special Requests   Final    NONE Performed at [facility], [address], [place], [place] [address]    Culture   Final    NO GROWTH Performed at [facility], [place] [address], [place], [place] [address]    Report Status 11/10/2017 FINAL  Final  MRSA PCR Screening     Status: Abnormal   Collection Time: 11/07/17 10:03 PM  Result Value Ref Range Status   MRSA by PCR POSITIVE (A) NEGATIVE Final    Comment:        The GeneXpert MRSA Assay (FDA approved for NASAL specimens only), is one component of a comprehensive MRSA colonization surveillance program. It is not intended to diagnose MRSA infection nor to guide  or monitor treatment for MRSA infections. RESULT CALLED TO, READ BACK BY AND VERIFIED WITH: WAGGONER,R AT 1941 BY HUFFINES,S ON 11/08/17. Performed at [facility], [address], [place], [place] [address]   Blood culture (routine x 2)     Status: None (Preliminary result)   Collection Time: 11/08/17  2:40 PM  Result Value Ref Range Status   Specimen Description   Final    LEFT ANTECUBITAL BOTTLES DRAWN AEROBIC AND ANAEROBIC   Special Requests Blood Culture adequate volume  Final   Culture   Final    NO GROWTH 2 DAYS Performed at [facility], [address], [place], [place] [address]    Report Status PENDING  Incomplete  Blood culture (routine x 2)     Status: None (Preliminary result)   Collection Time: 11/08/17  2:45 PM  Result Value Ref Range Status   Specimen Description   Final    BLOOD LEFT HAND BOTTLES DRAWN AEROBIC AND ANAEROBIC   Special Requests Blood Culture adequate volume  Final   Culture   Final    NO GROWTH 2 DAYS Performed at [facility], [address] [place], [place] [address]    Report Status PENDING  Incomplete     Scheduled Meds: .  Chlorhexidine Gluconate Cloth  6 each Topical Q0600  . insulin aspart  0-20 Units Subcutaneous TID WC  . insulin aspart  0-5 Units Subcutaneous QHS  . insulin aspart  5 Units Subcutaneous TID WC  . insulin glargine  45 Units Subcutaneous Daily  . mupirocin ointment  1 application Nasal BID   Continuous Infusions: . sodium chloride 10 mL/hr at 11/08/17 1714  . cefTRIAXone (ROCEPHIN)  IV Stopped (11/09/17 1613)    Procedures/Studies: Ct Head Wo Contrast  Result Date: 11/08/2017 CLINICAL DATA:  62 year old male with a history of weakness EXAM: CT HEAD WITHOUT CONTRAST TECHNIQUE: Contiguous axial images were obtained from the base of the skull through the vertex without intravenous contrast. COMPARISON:  03/03/2017 FINDINGS: Brain: No acute intracranial hemorrhage. No midline shift or mass effect. Gray-white differentiation maintained. Unremarkable appearance of the ventricular system. Vascular: Unremarkable. Skull: No acute fracture.  No aggressive bone lesion identified. Sinuses/Orbits: Unremarkable appearance of the orbits. Mastoid air cells clear. No middle ear effusion. No significant sinus disease. Other: None IMPRESSION: Negative head CT for acute intracranial abnormality Electronically Signed   By: Corrie Mckusick D.O.   On: 11/08/2017 13:18    Orson Eva, DO  [facility] Pager [phone]  If 7PM-7AM, please contact night-coverage [email or web address] Password [email or web address] 11/10/2017, 3:13 PM   LOS: 2 days

## 2017-11-11 LAB — BASIC METABOLIC PANEL
Anion gap: 4 — ABNORMAL LOW (ref 5–15)
BUN: 17 mg/dL (ref 6–20)
CO2: 25 mmol/L (ref 22–32)
CREATININE: 0.87 mg/dL (ref 0.61–1.24)
Calcium: 7.7 mg/dL — ABNORMAL LOW (ref 8.9–10.3)
Chloride: 102 mmol/L (ref 101–111)
Glucose, Bld: 447 mg/dL — ABNORMAL HIGH (ref 65–99)
Potassium: 4 mmol/L (ref 3.5–5.1)
SODIUM: 131 mmol/L — AB (ref 135–145)

## 2017-11-11 LAB — GLUCOSE, CAPILLARY
GLUCOSE-CAPILLARY: 355 mg/dL — AB (ref 65–99)
Glucose-Capillary: 465 mg/dL — ABNORMAL HIGH (ref 65–99)

## 2017-11-11 MED ORDER — METFORMIN HCL 1000 MG PO TABS: 1000.0000 mg | ORAL_TABLET | Freq: Two times a day (BID) | ORAL | 1 refills | Status: DC

## 2017-11-11 MED ORDER — LIVING WELL WITH DIABETES BOOK
Freq: Once | Status: AC
  Administered 2017-11-11: 12:00:00
  Filled 2017-11-11: qty 1

## 2017-11-11 MED ORDER — INSULIN GLARGINE 100 UNIT/ML ~~LOC~~ SOLN
60.0000 [IU] | Freq: Every day | SUBCUTANEOUS | Status: DC
  Administered 2017-11-11: 60 [IU] via SUBCUTANEOUS
  Filled 2017-11-11 (×2): qty 0.6

## 2017-11-11 MED ORDER — INSULIN GLARGINE 100 UNIT/ML ~~LOC~~ SOLN: 60.0000 [IU] | Freq: Every day | SUBCUTANEOUS | 1 refills | Status: DC

## 2017-11-11 MED ORDER — INSULIN ASPART 100 UNIT/ML ~~LOC~~ SOLN
8.0000 [IU] | Freq: Three times a day (TID) | SUBCUTANEOUS | Status: DC
  Administered 2017-11-11: 8 [IU] via SUBCUTANEOUS

## 2017-11-11 MED ORDER — INSULIN STARTER KIT- PEN NEEDLES (ENGLISH)
1.0000 | Freq: Once | Status: AC
  Administered 2017-11-11: 1
  Filled 2017-11-11: qty 1

## 2017-11-11 MED ORDER — INSULIN ASPART 100 UNIT/ML ~~LOC~~ SOLN
30.0000 [IU] | Freq: Once | SUBCUTANEOUS | Status: AC
  Administered 2017-11-11: 30 [IU] via SUBCUTANEOUS

## 2017-11-11 NOTE — Care Management Important Message (Signed)
Important Message  Patient Details  Name: ELVAN EBRON MRN: [id number] Date of Birth: [birth year]   Medicare Important Message Given:  Yes    Shelda Altes 11/11/2017, 1:06 PM

## 2017-11-11 NOTE — Care Management Note (Signed)
Case Management Note  Patient Details  Name: Juan Juarez MRN: [id number] Date of Birth: August 13, 1955  Subjective/Objective:         Pt admitted UTI. Will DC with SQ insulin. Pt has used insulin before with vile and syringe. Not sure he will be able to use a pen. Pt got CBG meter from pharmacy recently was unable to get supplies d/t not having Rx.            Action/Plan: DC home today with [facility] referral for nursing f/u. Pt has no preference of provider. Okay with using [facility]. Pt aware they have 48 hrs to make fist visit. Juliann Pulse, [facility] rep, aware of referral and will pull pt info. Benefits check done and results detailed below. Pt will need to be prescribed Basaglar SQ and have Rx for supplies.   Expected Discharge Date:       11/11/17           Expected Discharge Plan:  [place]  In-House Referral:  NA  Discharge planning Services  CM Consult  Post Acute Care Choice:  Home Health Choice offered to:  Patient  HH Arranged:  RN [facility] Agency:  [place]  Status of Service:  Completed, signed off  Additional Comments:  Benefits Check:  Pen SQ daily no covered by plan would have to PA @[phone] to get it approved.   Basaglar SQ is covered $8.50 for 90 or 30 day supply.  No PA required.  Pharmacy in-network are : [facility] ([facility] Order for 90 day supply).  [facility], [employer].   Sherald Barge, RN 11/11/2017, 12:48 PM

## 2017-11-11 NOTE — Progress Notes (Addendum)
Inpatient Diabetes Program Recommendations  AACE/ADA: New Consensus Statement on Inpatient Glycemic Control (2015)  Target Ranges:  Prepandial:   less than 140 mg/dL      Peak postprandial:   less than 180 mg/dL (1-2 hours)      Critically ill patients:  140 - 180 mg/dL   Results for Juan Juarez, Juan Juarez (MRN [id number]) as of 11/11/2017 09:20  Ref. Range 11/10/2017 07:59 11/10/2017 11:23 11/10/2017 16:18 11/10/2017 21:23 11/11/2017 07:35  Glucose-Capillary Latest Ref Range: 65 - 99 mg/dL 411 (H) 344 (H) 263 (H) 292 (H) 465 (H)  Results for Juan Juarez, Juan Juarez (MRN [id number]) as of 11/11/2017 09:20  Ref. Range 11/10/2015 12:48 09/03/2016 14:55 11/07/2017 22:14  Hemoglobin A1C Latest Ref Range: 4.8 - 5.6 % 7.2 (H) 12.3 (H) 12.4 (H)   Review of Glycemic Control  Diabetes history: DM2 Outpatient Diabetes medications: Glipizide 5 mg QAM, Metformin 1000 mg BID Current orders for Inpatient glycemic control: Lantus 60 units daily, Novolog 8 units TID with meals, Novolog 0-20 units TID with meals, Novolog 0-5 units QHS  Inpatient Diabetes Program Recommendations: Insulin - Basal: Noted Lantus was increased from 45 to 60 units to start today. Insulin - Meal Coverage: Please consider increasing meal coverage to Novolog 12 units TID with meals. HgbA1C: A1C 12.4% on 11/07/17 indicating an average glucose of 309 mg/dl over the past 2-3 months.   NOTE: Patient has DM2 hx and per chart patient is taking 2 oral DM medications as an outpatient. Patient was admitted on on 11/07/17 and left AMA on 11/08/17 and came back to the hospital on the same day (11/08/17). Per MD notes, patient will be discharged new to insulin. Patient will need to be educated on insulin by bedside RNs. Ordered insulin starter kit, living well with DM book, and patient education by RNs. Talked with Lattie Haw, RN and asked that nursing work with patient on insulin injections. Diabetes Coordinator is not on Anton Ruiz today but will be on 11/12/17. Will plan to  see patient on 11/12/17.  Addendum 11/11/17_0 :15-Spoke with patient about diabetes and home regimen for diabetes control. Patient reports that he is followed by PCP for diabetes management and he was taking Metformin as an outpatient for diabetes control. Inquired about any prior use of insulin. Patient states that he use to take insulin the past (Lantus) when he was first dx with DM 4-5 years ago. Patient reports that he used vial/syringe and would prefer to continue using vial/syringe if he needed to use insulin at home again. Patient states that he recently got a new glucometer but needs a lancet device. Informed patient he could go to a pharmacy and purchase a lancet device for less than $10 and patient states that he will plan to go purchase one. Reviewed Living Well with Diabetes book. Dicussed A1C results (12.4% on 11/07/17) and explained that his current A1C indicates an average glucose of 309 mg/dl over the past 2-3 months. Discussed glucose and A1C goals. Reviewed basic pathophysiology of DM.  Discussed importance of checking CBGs and maintaining good CBG control to prevent long-term and short-term complications. Explained how hyperglycemia leads to damage within blood vessels which lead to the common complications seen with uncontrolled diabetes.  Discussed impact of nutrition, exercise, stress, sickness, and medications on diabetes control.Patient reports that he has erectile dysfunction which is a concern for him. Stressed importance of improving DM control to decrease risk of further complications.  Discussed carbohydrates, carbohydrate goals per day and meal, along with portion  sizes. Encouraged patient to check his glucose 3-4 times per day (before meals and at bedtime) and to keep a log book of glucose readings and insulin taken which he will need to take to doctor appointments. Explained how the doctor he follows up with can use the log book to continue to make insulin adjustments if needed.  Patient states that he has seen Dr. Nida in the past and plans to ask PCP to refer him to Dr. Nida again since it has been a couple of years since he seen Dr. Nida. Discussed Lantus, Novolog insulin, Glipizide, and Metformin in more detail and how each one works to improve DM control. Discussed insulin vial/syringe versus insulin pens and patient states that he would prefer to use vial/syringe since that is what he is comfortable with.   Patient is willing to take insulin at home and appears motivated to make changes to get DM better controlled. Asked patient to demonstrate proper technique of drawing up and injecting insulin.  Patient successfully demonstrated competency with insulin administration by vial and syringe. Patient verbalized understanding of information discussed and he states that he has no further questions at this time related to diabetes.  Thanks, Marie , RN, MSN, CDE Diabetes Coordinator Inpatient Diabetes Program [phone] (Team Pager from 8am to 5pm)     

## 2017-11-11 NOTE — Progress Notes (Signed)
Pt's IV catheter removed and intact. Pt's IV site clean dry and intact. Discharge instructions including medications and follow up appointments were reviewed and discussed with patient. Diabetes education performed with patient and pt verbalized understanding. Handout given and no further questions at this time. Pt in stable condition and in no acute distress at time of discharge. Pt will be escorted by nurse tech.

## 2017-11-11 NOTE — Discharge Summary (Signed)
Physician Discharge Summary  Juan Juarez:[id number] DOB: [birth year] DOA: 11/08/2017  PCP: Rosita Fire, MD  Admit date: 11/08/2017 Discharge date: 11/11/2017  Admitted From: home Disposition:  Home   Recommendations for Outpatient Follow-up:  1. Follow up with PCP in 1-2 weeks 2. Please obtain BMP/CBC in one week   Discharge Condition: Stable CODE STATUS: FULL Diet recommendation: Heart Healthy / Carb Modified   Brief/Interim Summary: 62 year old male with a history of polysubstance abuse, diabetes mellitus, chronic hepatitis C, anxiety/depression presented with altered mental status. The patient was previously admitted to the hospital on 11/07/2017 for hyperosmolar state. He left AGAINST MEDICAL ADVICE in the early morning 11/08/2017. At that time, the patient stated that he had to move his RV or else he would lose it. When asked why he returned, the patient stated "I just felt bad,and I was staggering and could not walk straight."The patient denied any illicit drug use. He states that he has not used cocaine for over 5 months nor drinking alcohol for 6 months. However reviewed the medical record shows that he last drank alcohol in January 2019. He previously used cannabis, but has not smoked for nearly 6 months. Patient denies fevers, chills, headache, chest pain, dyspnea, nausea, vomiting, diarrhea, abdominal pain, dysuria, hematuria, hematochezia, and melena. In the emergency department, the patient was lethargic and there was difficulty obtaining history. He was noted to have serum glucose 498 with anion gap of 7. Patient will start IV fluids. Urinalysis showed >50 WBC.He was started on IV ceftriaxone. Ammonia was 61.  However, the patient's mental status was at baseline and he was lucid.  The patient's insulin was gradually adjusted; however, the patient's CBGs continue to be significantly elevated.  The patient endorsed that he had family/friends bring in candy bars  and other assorted snacks to him while he has been in the hospital.  The patient was seen by diabetic educator.  He was provided education regarding diet and proper use of insulin which he expressed understanding.    Discharge Diagnoses:  Acute metabolic encephalopathy -Multifactorial including UTI and hyperosmolar state -Mental status improving with antibiotics and IV fluids -back to baseline  Hyperosmolar nonketotic state -Improved with IV fluids -11/07/2017 hemoglobin A1c 12.4 -now tolerating diet  Diabetes mellitus type 2, uncontrolled -11/07/2017 hemoglobin A1c 12.4 -CBGs remain in 400s -Increase Lantus to 60 units daily -add novolog 8 units with meals -NovoLog sliding scale--increase to resistant scale -Patient will need to be discharged with insulin. Insulin education provided;however, patient appears to have poor health literacy  Pyuria -received 3 days of ceftriaxone -urine culture neg -d/c ceftriaxone  Hyperammonemia -Uncertain clinical significance as the patient's mental status is improved -Monitor clinically  Polysubstance abuse,in remission -Previously used alcohol, cocaine, THC  Chronic hepatitis C -[facility]  Thrombocytopenia -Secondary to chronic liver disease and alcohol     Discharge Instructions   Allergies as of 11/11/2017   No Known Allergies     Medication List    STOP taking these medications   glipiZIDE 5 MG tablet Commonly known as:  GLUCOTROL   meloxicam 7.5 MG tablet Commonly known as:  MOBIC     TAKE these medications   ALPRAZolam 1 MG tablet Commonly known as:  XANAX Take 1 mg by mouth at bedtime as needed for anxiety or sleep.   doxepin 25 MG capsule Commonly known as:  SINEQUAN Take 25 mg by mouth at bedtime as needed (sleep and anxiety).   insulin glargine  100 UNIT/ML injection Commonly known as:  LANTUS Inject 0.6 mLs (60 Units total) into the skin  daily. Start taking on:  11/12/2017   metFORMIN 1000 MG tablet Commonly known as:  GLUCOPHAGE Take 1 tablet (1,000 mg total) by mouth 2 (two) times daily with a meal.      Follow-up Information    [facility].   Specialty:  Home Health Services Contact information: [place] [address] [phone]          No Known Allergies  Consultations:  none   Procedures/Studies: Ct Head Wo Contrast  Result Date: 11/08/2017 CLINICAL DATA:  62 year old male with a history of weakness EXAM: CT HEAD WITHOUT CONTRAST TECHNIQUE: Contiguous axial images were obtained from the base of the skull through the vertex without intravenous contrast. COMPARISON:  03/03/2017 FINDINGS: Brain: No acute intracranial hemorrhage. No midline shift or mass effect. Gray-white differentiation maintained. Unremarkable appearance of the ventricular system. Vascular: Unremarkable. Skull: No acute fracture.  No aggressive bone lesion identified. Sinuses/Orbits: Unremarkable appearance of the orbits. Mastoid air cells clear. No middle ear effusion. No significant sinus disease. Other: None IMPRESSION: Negative head CT for acute intracranial abnormality Electronically Signed   By: Corrie Mckusick D.O.   On: 11/08/2017 13:18         Discharge Exam: Vitals:   11/11/17 0452 11/11/17 1414  BP: 113/75 123/80  Pulse: 67 74  Resp: 16 16  Temp: 98.6 F (37 C)   SpO2: 98% 97%   Vitals:   11/10/17 1329 11/10/17 2125 11/11/17 0452 11/11/17 1414  BP: 111/78 123/76 113/75 123/80  Pulse: 77 79 67 74  Resp: 16 17 16 16   Temp: 98.3 F (36.8 C) 98.4 F (36.9 C) 98.6 F (37 C)   TempSrc: Oral Oral Oral   SpO2: 98% 96% 98% 97%  Weight:      Height:        General: Pt is alert, awake, not in acute distress Cardiovascular: RRR, S1/S2 +, no rubs, no gallops Respiratory: CTA bilaterally, no wheezing, no rhonchi Abdominal: Soft, NT, ND, bowel sounds + Extremities:  no edema, no cyanosis   The results of significant diagnostics from this hospitalization (including imaging, microbiology, ancillary and laboratory) are listed below for reference.    Significant Diagnostic Studies: Ct Head Wo Contrast  Result Date: 11/08/2017 CLINICAL DATA:  62 year old male with a history of weakness EXAM: CT HEAD WITHOUT CONTRAST TECHNIQUE: Contiguous axial images were obtained from the base of the skull through the vertex without intravenous contrast. COMPARISON:  03/03/2017 FINDINGS: Brain: No acute intracranial hemorrhage. No midline shift or mass effect. Gray-white differentiation maintained. Unremarkable appearance of the ventricular system. Vascular: Unremarkable. Skull: No acute fracture.  No aggressive bone lesion identified. Sinuses/Orbits: Unremarkable appearance of the orbits. Mastoid air cells clear. No middle ear effusion. No significant sinus disease. Other: None IMPRESSION: Negative head CT for acute intracranial abnormality Electronically Signed   By: Corrie Mckusick D.O.   On: 11/08/2017 13:18     Microbiology: Recent Results (from the past 240 hour(s))  Urine Culture     Status: None   Collection Time: 11/07/17  8:46 PM  Result Value Ref Range Status   Specimen Description   Final    URINE, CLEAN CATCH Performed at [facility], [address], [place], [place] [address]    Special Requests   Final    NONE Performed at [facility], [address] [place], [place] [address]  Culture   Final    NO GROWTH Performed at [facility], [place] [address] [place], [place] [address]    Report Status 11/10/2017 FINAL  Final  MRSA PCR Screening     Status: Abnormal   Collection Time: 11/07/17 10:03 PM  Result Value Ref Range Status   MRSA by PCR POSITIVE (A) NEGATIVE Final    Comment:        The GeneXpert MRSA Assay (FDA approved for NASAL specimens only), is one component of a comprehensive MRSA colonization surveillance program. It is  not intended to diagnose MRSA infection nor to guide or monitor treatment for MRSA infections. RESULT CALLED TO, READ BACK BY AND VERIFIED WITH: WAGGONER,R AT 4008 BY HUFFINES,S ON 11/08/17. Performed at [facility], [address] [place], [place] [address]   Blood culture (routine x 2)     Status: None (Preliminary result)   Collection Time: 11/08/17  2:40 PM  Result Value Ref Range Status   Specimen Description   Final    LEFT ANTECUBITAL BOTTLES DRAWN AEROBIC AND ANAEROBIC   Special Requests Blood Culture adequate volume  Final   Culture   Final    NO GROWTH 3 DAYS Performed at [facility], [address] [place], [place] [address]    Report Status PENDING  Incomplete  Blood culture (routine x 2)     Status: None (Preliminary result)   Collection Time: 11/08/17  2:45 PM  Result Value Ref Range Status   Specimen Description   Final    BLOOD LEFT HAND BOTTLES DRAWN AEROBIC AND ANAEROBIC   Special Requests Blood Culture adequate volume  Final   Culture   Final    NO GROWTH 3 DAYS Performed at [facility], [address], [place], [place] [address]    Report Status PENDING  Incomplete     Labs: Basic Metabolic Panel: Recent Labs  Lab 11/08/17 0441 11/08/17 1217 11/09/17 0605 11/10/17 0851 11/11/17 0631  NA 134* 131* 136 132* 131*  K 3.1* 3.9 3.7 3.9 4.0  CL 102 100* 105 104 102  CO2 26 24 26 24 25   GLUCOSE 168* 498* 324* 442* 447*  BUN 19 17 15 16 17   CREATININE 0.57* 0.74 0.67 0.75 0.87  CALCIUM 8.2* 8.0* 7.8* 7.8* 7.7*  MG  --   --   --  1.5*  --    Liver Function Tests: Recent Labs  Lab 11/07/17 1930 11/08/17 1217 11/09/17 0605  AST 55* 70* 81*  ALT 49 47 51  ALKPHOS 106 71 62  BILITOT 1.3* 1.1 1.1  PROT 7.3 6.3* 5.9*  ALBUMIN 2.5* 2.1* 2.0*   No results for input(s): LIPASE, AMYLASE in the last 168 hours. Recent Labs  Lab 11/08/17 1217  AMMONIA 61*   CBC: Recent Labs  Lab 11/07/17 1930 11/08/17 0441 11/08/17 1217 11/09/17 0605  WBC  5.7 5.6 4.9 4.7  NEUTROABS 3.0  --  2.8  --   HGB 13.3 12.3* 12.1* 12.4*  HCT 38.1* 34.7* 33.7* 34.9*  MCV 97.2 95.9 96.0 97.2  PLT 88* 76* 72* 72*   Cardiac Enzymes: No results for input(s): CKTOTAL, CKMB, CKMBINDEX, TROPONINI in the last 168 hours. BNP: Invalid input(s): POCBNP CBG: Recent Labs  Lab 11/10/17 1123 11/10/17 1618 11/10/17 2123 11/11/17 0735 11/11/17 1142  GLUCAP 344* 263* 292* 465* 355*    Time coordinating discharge:  36 minutes  Signed:  Orson Eva, DO [facility] Pager: [phone] 11/11/2017, 2:49 PM

## 2017-11-13 LAB — CULTURE, BLOOD (ROUTINE X 2)
CULTURE: NO GROWTH
CULTURE: NO GROWTH
SPECIAL REQUESTS: ADEQUATE
Special Requests: ADEQUATE

## 2017-11-26 NOTE — Telephone Encounter (Signed)
Pt called today. Said OK to make referral to [facility]. He does not want the TCS and EGD.  His phone number is correct.

## 2017-11-26 NOTE — Addendum Note (Signed)
Addended by: Inge Rise on: 11/26/2017 03:43 PM   Modules accepted: Orders

## 2017-11-26 NOTE — Telephone Encounter (Signed)
Referral faxed to [facility]. 

## 2017-12-09 NOTE — Telephone Encounter (Signed)
PT called and said he has appt with Dawn Wed this week, but does not have transportation. I gave him her phone number to see if he can reschedule, since he said his mom might could take him on a different day.

## 2017-12-23 DIAGNOSIS — T1490XA Injury, unspecified, initial encounter: Secondary | ICD-10-CM | POA: Diagnosis not present

## 2017-12-30 DIAGNOSIS — R569 Unspecified convulsions: Secondary | ICD-10-CM

## 2017-12-30 DIAGNOSIS — S0990XA Unspecified injury of head, initial encounter: Secondary | ICD-10-CM

## 2017-12-30 HISTORY — DX: Unspecified injury of head, initial encounter: S09.90XA

## 2017-12-30 HISTORY — DX: Unspecified convulsions: R56.9

## 2017-12-31 ENCOUNTER — Emergency Department (HOSPITAL_COMMUNITY): Payer: Medicare Other

## 2017-12-31 ENCOUNTER — Encounter (HOSPITAL_COMMUNITY): Payer: Self-pay | Admitting: *Deleted

## 2017-12-31 ENCOUNTER — Inpatient Hospital Stay (HOSPITAL_COMMUNITY)
Admission: EM | Admit: 2017-12-31 | Discharge: 2018-01-01 | DRG: 084 | Disposition: A | Discharge status: Home / Self Care | Payer: Medicare Other | Attending: Family Medicine | Admitting: Family Medicine

## 2017-12-31 ENCOUNTER — Other Ambulatory Visit: Payer: Self-pay

## 2017-12-31 DIAGNOSIS — M5489 Other dorsalgia: Secondary | ICD-10-CM | POA: Diagnosis not present

## 2017-12-31 DIAGNOSIS — S30810A Abrasion of lower back and pelvis, initial encounter: Secondary | ICD-10-CM | POA: Diagnosis not present

## 2017-12-31 DIAGNOSIS — F1910 Other psychoactive substance abuse, uncomplicated: Secondary | ICD-10-CM | POA: Diagnosis present

## 2017-12-31 DIAGNOSIS — R402362 Coma scale, best motor response, obeys commands, at arrival to emergency department: Secondary | ICD-10-CM | POA: Diagnosis present

## 2017-12-31 DIAGNOSIS — K7030 Alcoholic cirrhosis of liver without ascites: Secondary | ICD-10-CM | POA: Diagnosis present

## 2017-12-31 DIAGNOSIS — R402252 Coma scale, best verbal response, oriented, at arrival to emergency department: Secondary | ICD-10-CM | POA: Diagnosis present

## 2017-12-31 DIAGNOSIS — F17210 Nicotine dependence, cigarettes, uncomplicated: Secondary | ICD-10-CM | POA: Diagnosis present

## 2017-12-31 DIAGNOSIS — E1165 Type 2 diabetes mellitus with hyperglycemia: Secondary | ICD-10-CM | POA: Diagnosis present

## 2017-12-31 DIAGNOSIS — F1020 Alcohol dependence, uncomplicated: Secondary | ICD-10-CM | POA: Diagnosis present

## 2017-12-31 DIAGNOSIS — Z720 Tobacco use: Secondary | ICD-10-CM | POA: Diagnosis not present

## 2017-12-31 DIAGNOSIS — F17200 Nicotine dependence, unspecified, uncomplicated: Secondary | ICD-10-CM | POA: Diagnosis present

## 2017-12-31 DIAGNOSIS — I10 Essential (primary) hypertension: Secondary | ICD-10-CM | POA: Diagnosis present

## 2017-12-31 DIAGNOSIS — G4700 Insomnia, unspecified: Secondary | ICD-10-CM | POA: Diagnosis present

## 2017-12-31 DIAGNOSIS — F419 Anxiety disorder, unspecified: Secondary | ICD-10-CM | POA: Diagnosis present

## 2017-12-31 DIAGNOSIS — R1110 Vomiting, unspecified: Secondary | ICD-10-CM | POA: Diagnosis not present

## 2017-12-31 DIAGNOSIS — Z794 Long term (current) use of insulin: Secondary | ICD-10-CM

## 2017-12-31 DIAGNOSIS — S065X9A Traumatic subdural hemorrhage with loss of consciousness of unspecified duration, initial encounter: Principal | ICD-10-CM | POA: Diagnosis present

## 2017-12-31 DIAGNOSIS — D696 Thrombocytopenia, unspecified: Secondary | ICD-10-CM

## 2017-12-31 DIAGNOSIS — R402142 Coma scale, eyes open, spontaneous, at arrival to emergency department: Secondary | ICD-10-CM | POA: Diagnosis present

## 2017-12-31 DIAGNOSIS — G4489 Other headache syndrome: Secondary | ICD-10-CM | POA: Diagnosis not present

## 2017-12-31 DIAGNOSIS — R51 Headache: Secondary | ICD-10-CM | POA: Diagnosis not present

## 2017-12-31 DIAGNOSIS — E43 Unspecified severe protein-calorie malnutrition: Secondary | ICD-10-CM | POA: Diagnosis present

## 2017-12-31 DIAGNOSIS — S065XAA Traumatic subdural hemorrhage with loss of consciousness status unknown, initial encounter: Secondary | ICD-10-CM | POA: Diagnosis present

## 2017-12-31 DIAGNOSIS — S065X0A Traumatic subdural hemorrhage without loss of consciousness, initial encounter: Secondary | ICD-10-CM | POA: Diagnosis not present

## 2017-12-31 DIAGNOSIS — I6201 Nontraumatic acute subdural hemorrhage: Secondary | ICD-10-CM | POA: Diagnosis not present

## 2017-12-31 DIAGNOSIS — B182 Chronic viral hepatitis C: Secondary | ICD-10-CM

## 2017-12-31 LAB — COMPREHENSIVE METABOLIC PANEL
ALT: 51 U/L — AB (ref 0–44)
ANION GAP: 7 (ref 5–15)
AST: 59 U/L — AB (ref 15–41)
Albumin: 2.7 g/dL — ABNORMAL LOW (ref 3.5–5.0)
Alkaline Phosphatase: 89 U/L (ref 38–126)
BUN: 17 mg/dL (ref 8–23)
CHLORIDE: 101 mmol/L (ref 98–111)
CO2: 25 mmol/L (ref 22–32)
Calcium: 8.8 mg/dL — ABNORMAL LOW (ref 8.9–10.3)
Creatinine, Ser: 0.71 mg/dL (ref 0.61–1.24)
Glucose, Bld: 285 mg/dL — ABNORMAL HIGH (ref 70–99)
Potassium: 4.1 mmol/L (ref 3.5–5.1)
Sodium: 133 mmol/L — ABNORMAL LOW (ref 135–145)
Total Bilirubin: 1.5 mg/dL — ABNORMAL HIGH (ref 0.3–1.2)
Total Protein: 7.9 g/dL (ref 6.5–8.1)

## 2017-12-31 LAB — MRSA PCR SCREENING: MRSA BY PCR: POSITIVE — AB

## 2017-12-31 LAB — GLUCOSE, CAPILLARY
GLUCOSE-CAPILLARY: 213 mg/dL — AB (ref 70–99)
GLUCOSE-CAPILLARY: 328 mg/dL — AB (ref 70–99)

## 2017-12-31 LAB — CBC
HCT: 35.5 % — ABNORMAL LOW (ref 39.0–52.0)
Hemoglobin: 12.7 g/dL — ABNORMAL LOW (ref 13.0–17.0)
MCH: 34.2 pg — ABNORMAL HIGH (ref 26.0–34.0)
MCHC: 35.8 g/dL (ref 30.0–36.0)
MCV: 95.7 fL (ref 78.0–100.0)
Platelets: 102 10*3/uL — ABNORMAL LOW (ref 150–400)
RBC: 3.71 MIL/uL — AB (ref 4.22–5.81)
RDW: 14.1 % (ref 11.5–15.5)
WBC: 7.8 10*3/uL (ref 4.0–10.5)

## 2017-12-31 LAB — PROTIME-INR
INR: 1.18
PROTHROMBIN TIME: 14.9 s (ref 11.4–15.2)

## 2017-12-31 LAB — CBG MONITORING, ED: GLUCOSE-CAPILLARY: 266 mg/dL — AB (ref 70–99)

## 2017-12-31 LAB — HEMOGLOBIN A1C
Hgb A1c MFr Bld: 7.8 % — ABNORMAL HIGH (ref 4.8–5.6)
Mean Plasma Glucose: 177.16 mg/dL

## 2017-12-31 LAB — ETHANOL

## 2017-12-31 LAB — I-STAT CG4 LACTIC ACID, ED: Lactic Acid, Venous: 1.58 mmol/L (ref 0.5–1.9)

## 2017-12-31 MED ORDER — THIAMINE HCL 100 MG/ML IJ SOLN
100.0000 mg | Freq: Every day | INTRAMUSCULAR | Status: DC
  Administered 2017-12-31 – 2018-01-01 (×2): 100 mg via INTRAVENOUS
  Filled 2017-12-31 (×2): qty 2

## 2017-12-31 MED ORDER — ONDANSETRON HCL 4 MG/2ML IJ SOLN: 4.0000 mg | Freq: Four times a day (QID) | INTRAMUSCULAR | Status: DC | PRN

## 2017-12-31 MED ORDER — FENTANYL CITRATE (PF) 100 MCG/2ML IJ SOLN
50.0000 ug | INTRAMUSCULAR | Status: DC | PRN
  Administered 2017-12-31: 50 ug via INTRAVENOUS
  Filled 2017-12-31: qty 2

## 2017-12-31 MED ORDER — KETOROLAC TROMETHAMINE 15 MG/ML IJ SOLN
15.0000 mg | Freq: Four times a day (QID) | INTRAMUSCULAR | Status: DC | PRN
  Administered 2017-12-31 – 2018-01-01 (×3): 15 mg via INTRAVENOUS
  Filled 2017-12-31 (×3): qty 1

## 2017-12-31 MED ORDER — INSULIN ASPART 100 UNIT/ML ~~LOC~~ SOLN
0.0000 [IU] | Freq: Three times a day (TID) | SUBCUTANEOUS | Status: DC
  Administered 2017-12-31: 5 [IU] via SUBCUTANEOUS
  Administered 2017-12-31 – 2018-01-01 (×2): 3 [IU] via SUBCUTANEOUS
  Administered 2018-01-01: 5 [IU] via SUBCUTANEOUS
  Filled 2017-12-31: qty 1

## 2017-12-31 MED ORDER — HYDROCODONE-ACETAMINOPHEN 5-325 MG PO TABS
1.0000 | ORAL_TABLET | Freq: Once | ORAL | Status: AC
  Administered 2017-12-31: 1 via ORAL
  Filled 2017-12-31: qty 1

## 2017-12-31 MED ORDER — CHLORHEXIDINE GLUCONATE CLOTH 2 % EX PADS
6.0000 | MEDICATED_PAD | Freq: Every day | CUTANEOUS | Status: DC
  Administered 2018-01-01: 6 via TOPICAL

## 2017-12-31 MED ORDER — ONDANSETRON HCL 4 MG PO TABS: 4.0000 mg | ORAL_TABLET | Freq: Four times a day (QID) | ORAL | Status: DC | PRN

## 2017-12-31 MED ORDER — FOLIC ACID 5 MG/ML IJ SOLN
1.0000 mg | Freq: Every day | INTRAMUSCULAR | Status: DC
  Administered 2017-12-31 – 2018-01-01 (×2): 1 mg via INTRAVENOUS
  Filled 2017-12-31 (×3): qty 0.2

## 2017-12-31 MED ORDER — INSULIN DETEMIR 100 UNIT/ML ~~LOC~~ SOLN
45.0000 [IU] | Freq: Every day | SUBCUTANEOUS | Status: DC
  Administered 2017-12-31: 45 [IU] via SUBCUTANEOUS
  Filled 2017-12-31 (×2): qty 0.45

## 2017-12-31 MED ORDER — MUPIROCIN 2 % EX OINT
1.0000 "application " | TOPICAL_OINTMENT | Freq: Two times a day (BID) | CUTANEOUS | Status: DC
  Administered 2017-12-31 – 2018-01-01 (×2): 1 via NASAL
  Filled 2017-12-31: qty 22

## 2017-12-31 MED ORDER — SENNOSIDES-DOCUSATE SODIUM 8.6-50 MG PO TABS: 1.0000 | ORAL_TABLET | Freq: Every evening | ORAL | Status: DC | PRN

## 2017-12-31 MED ORDER — FENTANYL CITRATE (PF) 100 MCG/2ML IJ SOLN
50.0000 ug | Freq: Once | INTRAMUSCULAR | Status: AC
  Administered 2017-12-31: 50 ug via INTRAVENOUS
  Filled 2017-12-31: qty 2

## 2017-12-31 MED ORDER — INSULIN ASPART 100 UNIT/ML ~~LOC~~ SOLN
0.0000 [IU] | Freq: Every day | SUBCUTANEOUS | Status: DC
  Administered 2017-12-31: 4 [IU] via SUBCUTANEOUS

## 2017-12-31 MED ORDER — SODIUM CHLORIDE 0.9 % IV SOLN
INTRAVENOUS | Status: DC
  Administered 2017-12-31 – 2018-01-01 (×4): via INTRAVENOUS

## 2017-12-31 MED ORDER — ALPRAZOLAM 0.5 MG PO TABS
1.0000 mg | ORAL_TABLET | Freq: Every evening | ORAL | Status: DC | PRN
  Administered 2017-12-31: 1 mg via ORAL
  Filled 2017-12-31: qty 2

## 2017-12-31 NOTE — Progress Notes (Signed)
Patient transferred here from [facility].  Discussed with Dr. Jerilee Hoh earlier today.  Her H&P reviewed.  Patient was seen at bedside.  Patient denies any headaches currently.  He feels better compared to this morning.  Denies any visual disturbances.  No focal weakness.  No other complaints offered.  His vital signs all appear to be stable.  Called Dr. Arnoldo Morale office to let them know that the patient has arrived at [facility].  We will continue to follow.  Bonnielee Haff 12/31/2017 2:40 PM

## 2017-12-31 NOTE — ED Notes (Signed)
Family at bedside. 
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
## 2018-01-01 ENCOUNTER — Inpatient Hospital Stay (HOSPITAL_COMMUNITY): Payer: Medicare Other

## 2018-01-01 DIAGNOSIS — S065X9A Traumatic subdural hemorrhage with loss of consciousness of unspecified duration, initial encounter: Principal | ICD-10-CM

## 2018-01-01 LAB — CBC
HEMATOCRIT: 32.4 % — AB (ref 39.0–52.0)
Hemoglobin: 11.2 g/dL — ABNORMAL LOW (ref 13.0–17.0)
MCH: 33.7 pg (ref 26.0–34.0)
MCHC: 34.6 g/dL (ref 30.0–36.0)
MCV: 97.6 fL (ref 78.0–100.0)
Platelets: 99 10*3/uL — ABNORMAL LOW (ref 150–400)
RBC: 3.32 MIL/uL — ABNORMAL LOW (ref 4.22–5.81)
RDW: 13.9 % (ref 11.5–15.5)
WBC: 7.5 10*3/uL (ref 4.0–10.5)

## 2018-01-01 LAB — COMPREHENSIVE METABOLIC PANEL
ALBUMIN: 2.1 g/dL — AB (ref 3.5–5.0)
ALT: 43 U/L (ref 0–44)
ANION GAP: 4 — AB (ref 5–15)
AST: 49 U/L — AB (ref 15–41)
Alkaline Phosphatase: 97 U/L (ref 38–126)
BUN: 18 mg/dL (ref 8–23)
CO2: 27 mmol/L (ref 22–32)
Calcium: 7.9 mg/dL — ABNORMAL LOW (ref 8.9–10.3)
Chloride: 102 mmol/L (ref 98–111)
Creatinine, Ser: 0.94 mg/dL (ref 0.61–1.24)
GFR calc Af Amer: >60 mL/min (ref >60)
GFR calc non Af Amer: >60 mL/min (ref >60)
GLUCOSE: 386 mg/dL — AB (ref 70–99)
POTASSIUM: 4.3 mmol/L (ref 3.5–5.1)
SODIUM: 133 mmol/L — AB (ref 135–145)
Total Bilirubin: 0.9 mg/dL (ref 0.3–1.2)
Total Protein: 6.6 g/dL (ref 6.5–8.1)

## 2018-01-01 LAB — HEMOGLOBIN A1C
Hgb A1c MFr Bld: 7.7 % — ABNORMAL HIGH (ref 4.8–5.6)
Mean Plasma Glucose: 174.29 mg/dL

## 2018-01-01 LAB — GLUCOSE, CAPILLARY
GLUCOSE-CAPILLARY: 289 mg/dL — AB (ref 70–99)
Glucose-Capillary: 202 mg/dL — ABNORMAL HIGH (ref 70–99)

## 2018-01-01 MED ORDER — SENNOSIDES-DOCUSATE SODIUM 8.6-50 MG PO TABS: 1.0000 | ORAL_TABLET | Freq: Every evening | ORAL | 0 refills | Status: DC | PRN

## 2018-01-01 MED ORDER — FOLIC ACID 1 MG PO TABS: 1.0000 mg | ORAL_TABLET | Freq: Every day | ORAL | Status: DC

## 2018-01-01 MED ORDER — VITAMIN B-1 100 MG PO TABS: 100.0000 mg | ORAL_TABLET | Freq: Every day | ORAL | Status: DC

## 2018-01-01 MED ORDER — ACETAMINOPHEN 500 MG PO TABS
500.0000 mg | ORAL_TABLET | Freq: Once | ORAL | Status: AC
Start: 2018-01-01 — End: 2018-01-01
  Administered 2018-01-01: 500 mg via ORAL
  Filled 2018-01-01: qty 1
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
## 2018-01-01 NOTE — Progress Notes (Signed)
Covering UR Case Manager Georgina Snell made aware of potential Code 44.  She is aware pt has discharge order, and states she will address.  Reinaldo Raddle, RN, BSN  Trauma/Neuro ICU Case Manager [phone]
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
## 2018-01-06 ENCOUNTER — Encounter: Payer: Self-pay | Admitting: Gastroenterology

## 2018-01-06 NOTE — Telephone Encounter (Signed)
Please arrange routine follow-up in 3-4 months with [place].

## 2018-01-06 NOTE — Telephone Encounter (Signed)
PATIENT SCHEDULED AND LETTER SENT  °

## 2018-01-11 DIAGNOSIS — Z79899 Other long term (current) drug therapy: Secondary | ICD-10-CM | POA: Diagnosis not present

## 2018-01-11 DIAGNOSIS — R29818 Other symptoms and signs involving the nervous system: Secondary | ICD-10-CM | POA: Diagnosis not present

## 2018-01-11 DIAGNOSIS — E1165 Type 2 diabetes mellitus with hyperglycemia: Secondary | ICD-10-CM | POA: Diagnosis not present

## 2018-01-11 DIAGNOSIS — R402441 Other coma, without documented Glasgow coma scale score, or with partial score reported, in the field [EMT or ambulance]: Secondary | ICD-10-CM | POA: Diagnosis not present

## 2018-01-11 DIAGNOSIS — R21 Rash and other nonspecific skin eruption: Secondary | ICD-10-CM | POA: Diagnosis not present

## 2018-01-11 DIAGNOSIS — R4182 Altered mental status, unspecified: Secondary | ICD-10-CM | POA: Diagnosis not present

## 2018-01-11 DIAGNOSIS — E119 Type 2 diabetes mellitus without complications: Secondary | ICD-10-CM | POA: Diagnosis not present

## 2018-01-11 DIAGNOSIS — R531 Weakness: Secondary | ICD-10-CM | POA: Diagnosis not present

## 2018-01-11 DIAGNOSIS — R0689 Other abnormalities of breathing: Secondary | ICD-10-CM | POA: Diagnosis not present

## 2018-01-11 DIAGNOSIS — S065X9A Traumatic subdural hemorrhage with loss of consciousness of unspecified duration, initial encounter: Secondary | ICD-10-CM | POA: Diagnosis not present

## 2018-01-11 DIAGNOSIS — R569 Unspecified convulsions: Secondary | ICD-10-CM | POA: Diagnosis not present

## 2018-01-11 DIAGNOSIS — F17200 Nicotine dependence, unspecified, uncomplicated: Secondary | ICD-10-CM | POA: Diagnosis not present

## 2018-01-11 DIAGNOSIS — S065X0A Traumatic subdural hemorrhage without loss of consciousness, initial encounter: Secondary | ICD-10-CM | POA: Diagnosis not present

## 2018-01-11 DIAGNOSIS — I6200 Nontraumatic subdural hemorrhage, unspecified: Secondary | ICD-10-CM | POA: Diagnosis not present

## 2018-01-11 DIAGNOSIS — Z7984 Long term (current) use of oral hypoglycemic drugs: Secondary | ICD-10-CM | POA: Diagnosis not present

## 2018-01-11 DIAGNOSIS — R0789 Other chest pain: Secondary | ICD-10-CM | POA: Diagnosis not present

## 2018-01-12 DIAGNOSIS — G9389 Other specified disorders of brain: Secondary | ICD-10-CM | POA: Diagnosis not present

## 2018-01-12 DIAGNOSIS — G935 Compression of brain: Secondary | ICD-10-CM | POA: Diagnosis not present

## 2018-01-12 DIAGNOSIS — I639 Cerebral infarction, unspecified: Secondary | ICD-10-CM | POA: Diagnosis not present

## 2018-01-14 DIAGNOSIS — G9340 Encephalopathy, unspecified: Secondary | ICD-10-CM | POA: Diagnosis not present

## 2018-01-14 DIAGNOSIS — S065X9A Traumatic subdural hemorrhage with loss of consciousness of unspecified duration, initial encounter: Secondary | ICD-10-CM | POA: Diagnosis not present

## 2018-01-14 DIAGNOSIS — Z720 Tobacco use: Secondary | ICD-10-CM | POA: Diagnosis not present

## 2018-01-14 DIAGNOSIS — G40219 Localization-related (focal) (partial) symptomatic epilepsy and epileptic syndromes with complex partial seizures, intractable, without status epilepticus: Secondary | ICD-10-CM | POA: Diagnosis not present

## 2018-01-14 DIAGNOSIS — E1165 Type 2 diabetes mellitus with hyperglycemia: Secondary | ICD-10-CM | POA: Diagnosis not present

## 2018-01-14 DIAGNOSIS — Z794 Long term (current) use of insulin: Secondary | ICD-10-CM | POA: Diagnosis not present

## 2018-01-16 ENCOUNTER — Other Ambulatory Visit (HOSPITAL_COMMUNITY): Payer: Self-pay | Admitting: Student

## 2018-01-16 DIAGNOSIS — S0990XA Unspecified injury of head, initial encounter: Secondary | ICD-10-CM

## 2018-01-17 DIAGNOSIS — R569 Unspecified convulsions: Secondary | ICD-10-CM | POA: Diagnosis not present

## 2018-01-17 DIAGNOSIS — R262 Difficulty in walking, not elsewhere classified: Secondary | ICD-10-CM | POA: Diagnosis not present

## 2018-01-17 DIAGNOSIS — E1165 Type 2 diabetes mellitus with hyperglycemia: Secondary | ICD-10-CM | POA: Diagnosis not present

## 2018-01-20 ENCOUNTER — Inpatient Hospital Stay (HOSPITAL_COMMUNITY): Payer: Medicare Other

## 2018-01-20 ENCOUNTER — Emergency Department (HOSPITAL_COMMUNITY): Payer: Medicare Other

## 2018-01-20 ENCOUNTER — Encounter (HOSPITAL_COMMUNITY): Payer: Self-pay | Admitting: Emergency Medicine

## 2018-01-20 ENCOUNTER — Inpatient Hospital Stay (HOSPITAL_COMMUNITY)
Admission: EM | Admit: 2018-01-20 | Discharge: 2018-01-20 | DRG: 101 | Discharge status: Against Medical Advice | Payer: Medicare Other | Attending: Internal Medicine | Admitting: Internal Medicine

## 2018-01-20 DIAGNOSIS — G4700 Insomnia, unspecified: Secondary | ICD-10-CM | POA: Diagnosis present

## 2018-01-20 DIAGNOSIS — B1920 Unspecified viral hepatitis C without hepatic coma: Secondary | ICD-10-CM | POA: Diagnosis present

## 2018-01-20 DIAGNOSIS — S065X9A Traumatic subdural hemorrhage with loss of consciousness of unspecified duration, initial encounter: Secondary | ICD-10-CM

## 2018-01-20 DIAGNOSIS — F17210 Nicotine dependence, cigarettes, uncomplicated: Secondary | ICD-10-CM | POA: Diagnosis not present

## 2018-01-20 DIAGNOSIS — R400 Somnolence: Secondary | ICD-10-CM | POA: Diagnosis not present

## 2018-01-20 DIAGNOSIS — Z79899 Other long term (current) drug therapy: Secondary | ICD-10-CM

## 2018-01-20 DIAGNOSIS — I6201 Nontraumatic acute subdural hemorrhage: Secondary | ICD-10-CM | POA: Diagnosis not present

## 2018-01-20 DIAGNOSIS — F1010 Alcohol abuse, uncomplicated: Secondary | ICD-10-CM | POA: Diagnosis not present

## 2018-01-20 DIAGNOSIS — G40909 Epilepsy, unspecified, not intractable, without status epilepticus: Secondary | ICD-10-CM | POA: Diagnosis not present

## 2018-01-20 DIAGNOSIS — Z7984 Long term (current) use of oral hypoglycemic drugs: Secondary | ICD-10-CM

## 2018-01-20 DIAGNOSIS — R569 Unspecified convulsions: Secondary | ICD-10-CM | POA: Diagnosis not present

## 2018-01-20 DIAGNOSIS — E119 Type 2 diabetes mellitus without complications: Secondary | ICD-10-CM | POA: Diagnosis present

## 2018-01-20 DIAGNOSIS — I6200 Nontraumatic subdural hemorrhage, unspecified: Secondary | ICD-10-CM | POA: Diagnosis present

## 2018-01-20 DIAGNOSIS — K7460 Unspecified cirrhosis of liver: Secondary | ICD-10-CM | POA: Diagnosis not present

## 2018-01-20 DIAGNOSIS — F419 Anxiety disorder, unspecified: Secondary | ICD-10-CM | POA: Diagnosis not present

## 2018-01-20 DIAGNOSIS — D696 Thrombocytopenia, unspecified: Secondary | ICD-10-CM | POA: Diagnosis not present

## 2018-01-20 DIAGNOSIS — R52 Pain, unspecified: Secondary | ICD-10-CM | POA: Diagnosis not present

## 2018-01-20 DIAGNOSIS — S065XAA Traumatic subdural hemorrhage with loss of consciousness status unknown, initial encounter: Secondary | ICD-10-CM

## 2018-01-20 DIAGNOSIS — I10 Essential (primary) hypertension: Secondary | ICD-10-CM | POA: Diagnosis present

## 2018-01-20 DIAGNOSIS — R4781 Slurred speech: Secondary | ICD-10-CM | POA: Diagnosis not present

## 2018-01-20 DIAGNOSIS — R4182 Altered mental status, unspecified: Secondary | ICD-10-CM | POA: Diagnosis not present

## 2018-01-20 DIAGNOSIS — S065X0A Traumatic subdural hemorrhage without loss of consciousness, initial encounter: Secondary | ICD-10-CM | POA: Diagnosis not present

## 2018-01-20 DIAGNOSIS — R0989 Other specified symptoms and signs involving the circulatory and respiratory systems: Secondary | ICD-10-CM | POA: Diagnosis not present

## 2018-01-20 LAB — MRSA PCR SCREENING: MRSA by PCR: NEGATIVE

## 2018-01-20 LAB — URINALYSIS, ROUTINE W REFLEX MICROSCOPIC
BACTERIA UA: NONE SEEN
Bilirubin Urine: NEGATIVE
Glucose, UA: 50 mg/dL — AB
HGB URINE DIPSTICK: NEGATIVE
KETONES UR: NEGATIVE mg/dL
Leukocytes, UA: NEGATIVE
NITRITE: NEGATIVE
PROTEIN: 100 mg/dL — AB
Specific Gravity, Urine: 1.021 (ref 1.005–1.030)
pH: 6 (ref 5.0–8.0)

## 2018-01-20 LAB — MAGNESIUM
MAGNESIUM: 1.5 mg/dL — AB (ref 1.7–2.4)
MAGNESIUM: 1.4 mg/dL — AB (ref 1.7–2.4)

## 2018-01-20 LAB — CBC WITH DIFFERENTIAL/PLATELET
BASOS PCT: 0 %
Basophils Absolute: 0 10*3/uL (ref 0.0–0.1)
EOS ABS: 0.1 10*3/uL (ref 0.0–0.7)
Eosinophils Relative: 1 %
HEMATOCRIT: 38.6 % — AB (ref 39.0–52.0)
HEMOGLOBIN: 13.6 g/dL (ref 13.0–17.0)
Lymphocytes Relative: 36 %
Lymphs Abs: 2.5 10*3/uL (ref 0.7–4.0)
MCH: 34.3 pg — ABNORMAL HIGH (ref 26.0–34.0)
MCHC: 35.2 g/dL (ref 30.0–36.0)
MCV: 97.2 fL (ref 78.0–100.0)
MONOS PCT: 9 %
Monocytes Absolute: 0.6 10*3/uL (ref 0.1–1.0)
NEUTROS ABS: 3.7 10*3/uL (ref 1.7–7.7)
NEUTROS PCT: 54 %
Platelets: 120 10*3/uL — ABNORMAL LOW (ref 150–400)
RBC: 3.97 MIL/uL — AB (ref 4.22–5.81)
RDW: 14.6 % (ref 11.5–15.5)
WBC: 6.8 10*3/uL (ref 4.0–10.5)

## 2018-01-20 LAB — I-STAT CHEM 8, ED
BUN: 12 mg/dL (ref 8–23)
CHLORIDE: 102 mmol/L (ref 98–111)
CREATININE: 0.7 mg/dL (ref 0.61–1.24)
Calcium, Ion: 1.17 mmol/L (ref 1.15–1.40)
Glucose, Bld: 174 mg/dL — ABNORMAL HIGH (ref 70–99)
HEMATOCRIT: 37 % — AB (ref 39.0–52.0)
Hemoglobin: 12.6 g/dL — ABNORMAL LOW (ref 13.0–17.0)
POTASSIUM: 4 mmol/L (ref 3.5–5.1)
SODIUM: 136 mmol/L (ref 135–145)
TCO2: 23 mmol/L (ref 22–32)

## 2018-01-20 LAB — COMPREHENSIVE METABOLIC PANEL
ALBUMIN: 2.5 g/dL — AB (ref 3.5–5.0)
ALK PHOS: 110 U/L (ref 38–126)
ALT: 37 U/L (ref 0–44)
AST: 45 U/L — ABNORMAL HIGH (ref 15–41)
Anion gap: 6 (ref 5–15)
BUN: 14 mg/dL (ref 8–23)
CALCIUM: 8.8 mg/dL — AB (ref 8.9–10.3)
CO2: 25 mmol/L (ref 22–32)
CREATININE: 0.72 mg/dL (ref 0.61–1.24)
Chloride: 103 mmol/L (ref 98–111)
GFR calc Af Amer: >60 mL/min (ref >60)
GLUCOSE: 174 mg/dL — AB (ref 70–99)
POTASSIUM: 4 mmol/L (ref 3.5–5.1)
Sodium: 134 mmol/L — ABNORMAL LOW (ref 135–145)
TOTAL PROTEIN: 7.6 g/dL (ref 6.5–8.1)
Total Bilirubin: 1.2 mg/dL (ref 0.3–1.2)

## 2018-01-20 LAB — RAPID URINE DRUG SCREEN, HOSP PERFORMED
AMPHETAMINES: NOT DETECTED
Benzodiazepines: NOT DETECTED
Cocaine: NOT DETECTED
OPIATES: NOT DETECTED
Tetrahydrocannabinol: NOT DETECTED

## 2018-01-20 LAB — PROTIME-INR
INR: 1.11
Prothrombin Time: 14.3 seconds (ref 11.4–15.2)

## 2018-01-20 LAB — CBG MONITORING, ED: Glucose-Capillary: 176 mg/dL — ABNORMAL HIGH (ref 70–99)

## 2018-01-20 LAB — ETHANOL: Alcohol, Ethyl (B): <10 mg/dL (ref <10)

## 2018-01-20 MED ORDER — LORAZEPAM 2 MG/ML IJ SOLN: 1.0000 mg | INTRAMUSCULAR | Status: DC | PRN

## 2018-01-20 MED ORDER — LEVETIRACETAM IN NACL 500 MG/100ML IV SOLN
500.0000 mg | Freq: Two times a day (BID) | INTRAVENOUS | Status: DC
  Filled 2018-01-20: qty 100

## 2018-01-20 MED ORDER — LORAZEPAM 2 MG/ML IJ SOLN
1.0000 mg | Freq: Once | INTRAMUSCULAR | Status: AC
  Administered 2018-01-20: 1 mg via INTRAVENOUS

## 2018-01-20 MED ORDER — ACETAMINOPHEN 650 MG RE SUPP: 650.0000 mg | Freq: Four times a day (QID) | RECTAL | Status: DC | PRN

## 2018-01-20 MED ORDER — INSULIN ASPART 100 UNIT/ML ~~LOC~~ SOLN: 0.0000 [IU] | SUBCUTANEOUS | Status: DC

## 2018-01-20 MED ORDER — LEVETIRACETAM IN NACL 1000 MG/100ML IV SOLN
1000.0000 mg | Freq: Once | INTRAVENOUS | Status: AC
  Administered 2018-01-20: 1000 mg via INTRAVENOUS
  Filled 2018-01-20: qty 100

## 2018-01-20 MED ORDER — LEVALBUTEROL HCL 0.63 MG/3ML IN NEBU
0.6300 mg | INHALATION_SOLUTION | Freq: Four times a day (QID) | RESPIRATORY_TRACT | Status: DC | PRN
Start: 2018-01-20 — End: 2018-01-20

## 2018-01-20 MED ORDER — ACETAMINOPHEN 325 MG PO TABS: 650.0000 mg | ORAL_TABLET | Freq: Four times a day (QID) | ORAL | Status: DC | PRN

## 2018-01-20 MED ORDER — ONDANSETRON HCL 4 MG/2ML IJ SOLN: 4.0000 mg | Freq: Four times a day (QID) | INTRAMUSCULAR | Status: DC | PRN

## 2018-01-20 MED ORDER — SODIUM CHLORIDE 0.9 % IV SOLN
INTRAVENOUS | Status: DC
  Administered 2018-01-20: 16:00:00 via INTRAVENOUS

## 2018-01-20 MED ORDER — LORAZEPAM 2 MG/ML IJ SOLN
INTRAMUSCULAR | Status: AC
  Filled 2018-01-20: qty 1

## 2018-01-20 MED ORDER — LORAZEPAM 2 MG/ML IJ SOLN: 2.0000 mg | INTRAMUSCULAR | Status: DC | PRN

## 2018-01-20 MED ORDER — ONDANSETRON HCL 4 MG PO TABS: 4.0000 mg | ORAL_TABLET | Freq: Four times a day (QID) | ORAL | Status: DC | PRN

## 2018-01-20 MED ORDER — DEXTROSE 5 % IV SOLN
3.0000 g | Freq: Once | INTRAVENOUS | Status: AC
  Administered 2018-01-20: 3 g via INTRAVENOUS
  Filled 2018-01-20: qty 6

## 2018-01-20 MED ORDER — INSULIN GLARGINE 100 UNIT/ML ~~LOC~~ SOLN
5.0000 [IU] | Freq: Every day | SUBCUTANEOUS | Status: DC
  Filled 2018-01-20: qty 0.05

## 2018-01-20 MED ORDER — FENTANYL CITRATE (PF) 100 MCG/2ML IJ SOLN
INTRAMUSCULAR | Status: AC
  Administered 2018-01-20: 100 ug
  Filled 2018-01-20: qty 2
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
## 2018-01-20 NOTE — Progress Notes (Signed)
Nurse went to patient room because bed alarm was sounding. Patient was trying to get out of bed stating "I am leaving because I am hungry". Patient repeated this over and over again. Nurse educated patient on why he was here and the need for [place] to treat him for seizures. Nurse then tried to redirect patient's attention while Charge Nurse paged Physician.   Dr. Allyson Sabal called back and said she would put in an order for food.   Nurse tried to redirect patient attention and educated that food would be coming within the hour. Patient proceeded to get dressed making comments about "[facility] in [place] has better treatment" "they gave me all sorts of food while I was there" and "what kind of hospital doesn't feed their patients?".   Patient states that he has not eaten since yesterday and stopped at an ATM so "has more than enough money to get him something to eat.    Cab was called for patient.   Patient AMA form is signed and in his file.

## 2018-01-20 NOTE — ED Notes (Signed)
Pt returned from CT °

## 2018-01-20 NOTE — Progress Notes (Signed)
Patient's mother called Valene Bors) for an update on patient.   Nurse explained that he was not willing to stay and wait for his food tray to be delivered based on new orders per Dr. Allyson Sabal.   Mother stated "he has done this before" and apologized "if patient was any trouble".  Dr. Allyson Sabal was paged again and given an update on patient leaving AMA.

## 2018-01-20 NOTE — Progress Notes (Signed)
Patient arrived from [facility] with [place]. Vitals stable. Admitting paged to assign Attending.

## 2018-01-20 NOTE — Progress Notes (Signed)
Films reviewed, no change. Most likely seizure activity is responsible for mental status change. Will see when patient returns to floor.
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
## 2018-01-20 NOTE — ED Notes (Signed)
Patient unable to sign transfer request. Patient verbalizes consent to be transferred to [facility].  Medically indicated transfer to neurologic intervention.
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
## 2018-01-21 ENCOUNTER — Inpatient Hospital Stay (HOSPITAL_COMMUNITY)
Admission: EM | Admit: 2018-01-21 | Discharge: 2018-02-03 | DRG: 025 | Disposition: A | Discharge status: Home Health | Payer: Medicare Other | Attending: Internal Medicine | Admitting: Internal Medicine

## 2018-01-21 ENCOUNTER — Encounter (HOSPITAL_COMMUNITY): Payer: Self-pay | Admitting: Emergency Medicine

## 2018-01-21 ENCOUNTER — Emergency Department (HOSPITAL_COMMUNITY): Payer: Medicare Other

## 2018-01-21 DIAGNOSIS — R402124 Coma scale, eyes open, to pain, 24 hours or more after hospital admission: Secondary | ICD-10-CM | POA: Diagnosis not present

## 2018-01-21 DIAGNOSIS — G40101 Localization-related (focal) (partial) symptomatic epilepsy and epileptic syndromes with simple partial seizures, not intractable, with status epilepticus: Secondary | ICD-10-CM | POA: Diagnosis present

## 2018-01-21 DIAGNOSIS — Z781 Physical restraint status: Secondary | ICD-10-CM

## 2018-01-21 DIAGNOSIS — B1920 Unspecified viral hepatitis C without hepatic coma: Secondary | ICD-10-CM | POA: Diagnosis present

## 2018-01-21 DIAGNOSIS — N390 Urinary tract infection, site not specified: Secondary | ICD-10-CM | POA: Diagnosis not present

## 2018-01-21 DIAGNOSIS — Z4659 Encounter for fitting and adjustment of other gastrointestinal appliance and device: Secondary | ICD-10-CM

## 2018-01-21 DIAGNOSIS — G40909 Epilepsy, unspecified, not intractable, without status epilepticus: Secondary | ICD-10-CM | POA: Diagnosis not present

## 2018-01-21 DIAGNOSIS — S065XAA Traumatic subdural hemorrhage with loss of consciousness status unknown, initial encounter: Secondary | ICD-10-CM | POA: Diagnosis present

## 2018-01-21 DIAGNOSIS — R569 Unspecified convulsions: Secondary | ICD-10-CM | POA: Diagnosis not present

## 2018-01-21 DIAGNOSIS — T420X5A Adverse effect of hydantoin derivatives, initial encounter: Secondary | ICD-10-CM | POA: Diagnosis not present

## 2018-01-21 DIAGNOSIS — B9620 Unspecified Escherichia coli [E. coli] as the cause of diseases classified elsewhere: Secondary | ICD-10-CM | POA: Diagnosis not present

## 2018-01-21 DIAGNOSIS — R52 Pain, unspecified: Secondary | ICD-10-CM | POA: Diagnosis not present

## 2018-01-21 DIAGNOSIS — E872 Acidosis: Secondary | ICD-10-CM | POA: Diagnosis present

## 2018-01-21 DIAGNOSIS — F14129 Cocaine abuse with intoxication, unspecified: Secondary | ICD-10-CM | POA: Diagnosis present

## 2018-01-21 DIAGNOSIS — G40901 Epilepsy, unspecified, not intractable, with status epilepticus: Secondary | ICD-10-CM | POA: Diagnosis present

## 2018-01-21 DIAGNOSIS — R4020 Unspecified coma: Secondary | ICD-10-CM | POA: Diagnosis not present

## 2018-01-21 DIAGNOSIS — Z9119 Patient's noncompliance with other medical treatment and regimen: Secondary | ICD-10-CM

## 2018-01-21 DIAGNOSIS — R402354 Coma scale, best motor response, localizes pain, 24 hours or more after hospital admission: Secondary | ICD-10-CM | POA: Diagnosis not present

## 2018-01-21 DIAGNOSIS — K7030 Alcoholic cirrhosis of liver without ascites: Secondary | ICD-10-CM | POA: Diagnosis present

## 2018-01-21 DIAGNOSIS — D539 Nutritional anemia, unspecified: Secondary | ICD-10-CM | POA: Diagnosis present

## 2018-01-21 DIAGNOSIS — S065X9A Traumatic subdural hemorrhage with loss of consciousness of unspecified duration, initial encounter: Secondary | ICD-10-CM | POA: Diagnosis not present

## 2018-01-21 DIAGNOSIS — Y900 Blood alcohol level of less than 20 mg/100 ml: Secondary | ICD-10-CM | POA: Diagnosis present

## 2018-01-21 DIAGNOSIS — D696 Thrombocytopenia, unspecified: Secondary | ICD-10-CM | POA: Diagnosis present

## 2018-01-21 DIAGNOSIS — J9811 Atelectasis: Secondary | ICD-10-CM | POA: Diagnosis not present

## 2018-01-21 DIAGNOSIS — I952 Hypotension due to drugs: Secondary | ICD-10-CM | POA: Diagnosis not present

## 2018-01-21 DIAGNOSIS — F1910 Other psychoactive substance abuse, uncomplicated: Secondary | ICD-10-CM | POA: Diagnosis present

## 2018-01-21 DIAGNOSIS — I493 Ventricular premature depolarization: Secondary | ICD-10-CM | POA: Diagnosis present

## 2018-01-21 DIAGNOSIS — T4275XA Adverse effect of unspecified antiepileptic and sedative-hypnotic drugs, initial encounter: Secondary | ICD-10-CM | POA: Diagnosis not present

## 2018-01-21 DIAGNOSIS — F10239 Alcohol dependence with withdrawal, unspecified: Secondary | ICD-10-CM | POA: Diagnosis not present

## 2018-01-21 DIAGNOSIS — I6200 Nontraumatic subdural hemorrhage, unspecified: Secondary | ICD-10-CM | POA: Diagnosis not present

## 2018-01-21 DIAGNOSIS — E119 Type 2 diabetes mellitus without complications: Secondary | ICD-10-CM | POA: Diagnosis present

## 2018-01-21 DIAGNOSIS — F1410 Cocaine abuse, uncomplicated: Secondary | ICD-10-CM | POA: Diagnosis present

## 2018-01-21 DIAGNOSIS — R402224 Coma scale, best verbal response, incomprehensible words, 24 hours or more after hospital admission: Secondary | ICD-10-CM | POA: Diagnosis not present

## 2018-01-21 DIAGNOSIS — E7220 Disorder of urea cycle metabolism, unspecified: Secondary | ICD-10-CM | POA: Diagnosis present

## 2018-01-21 DIAGNOSIS — R4182 Altered mental status, unspecified: Secondary | ICD-10-CM | POA: Diagnosis not present

## 2018-01-21 DIAGNOSIS — Z794 Long term (current) use of insulin: Secondary | ICD-10-CM

## 2018-01-21 DIAGNOSIS — R471 Dysarthria and anarthria: Secondary | ICD-10-CM | POA: Diagnosis not present

## 2018-01-21 DIAGNOSIS — R531 Weakness: Secondary | ICD-10-CM | POA: Diagnosis not present

## 2018-01-21 DIAGNOSIS — R0602 Shortness of breath: Secondary | ICD-10-CM

## 2018-01-21 DIAGNOSIS — J9600 Acute respiratory failure, unspecified whether with hypoxia or hypercapnia: Secondary | ICD-10-CM

## 2018-01-21 DIAGNOSIS — E43 Unspecified severe protein-calorie malnutrition: Secondary | ICD-10-CM | POA: Diagnosis present

## 2018-01-21 DIAGNOSIS — I10 Essential (primary) hypertension: Secondary | ICD-10-CM | POA: Diagnosis present

## 2018-01-21 DIAGNOSIS — Z6823 Body mass index (BMI) 23.0-23.9, adult: Secondary | ICD-10-CM

## 2018-01-21 DIAGNOSIS — G9341 Metabolic encephalopathy: Secondary | ICD-10-CM | POA: Diagnosis present

## 2018-01-21 DIAGNOSIS — Y92230 Patient room in hospital as the place of occurrence of the external cause: Secondary | ICD-10-CM | POA: Diagnosis not present

## 2018-01-21 DIAGNOSIS — R0902 Hypoxemia: Secondary | ICD-10-CM | POA: Diagnosis not present

## 2018-01-21 DIAGNOSIS — F17210 Nicotine dependence, cigarettes, uncomplicated: Secondary | ICD-10-CM | POA: Diagnosis present

## 2018-01-21 LAB — RAPID URINE DRUG SCREEN, HOSP PERFORMED
Amphetamines: NOT DETECTED
BENZODIAZEPINES: POSITIVE — AB
Barbiturates: NOT DETECTED
Cocaine: POSITIVE — AB
Opiates: NOT DETECTED
Tetrahydrocannabinol: NOT DETECTED

## 2018-01-21 LAB — COMPREHENSIVE METABOLIC PANEL
ALK PHOS: 121 U/L (ref 38–126)
ALT: 41 U/L (ref 0–44)
ANION GAP: 5 (ref 5–15)
AST: 57 U/L — ABNORMAL HIGH (ref 15–41)
Albumin: 2.4 g/dL — ABNORMAL LOW (ref 3.5–5.0)
BILIRUBIN TOTAL: 1 mg/dL (ref 0.3–1.2)
BUN: 15 mg/dL (ref 8–23)
CALCIUM: 8.5 mg/dL — AB (ref 8.9–10.3)
CO2: 26 mmol/L (ref 22–32)
CREATININE: 0.86 mg/dL (ref 0.61–1.24)
Chloride: 105 mmol/L (ref 98–111)
GFR calc non Af Amer: >60 mL/min (ref >60)
GLUCOSE: 181 mg/dL — AB (ref 70–99)
Potassium: 4.7 mmol/L (ref 3.5–5.1)
SODIUM: 136 mmol/L (ref 135–145)
TOTAL PROTEIN: 7.2 g/dL (ref 6.5–8.1)

## 2018-01-21 LAB — URINALYSIS, ROUTINE W REFLEX MICROSCOPIC
Bacteria, UA: NONE SEEN
Bilirubin Urine: NEGATIVE
Glucose, UA: NEGATIVE mg/dL
KETONES UR: NEGATIVE mg/dL
Leukocytes, UA: NEGATIVE
Nitrite: NEGATIVE
PROTEIN: NEGATIVE mg/dL
Specific Gravity, Urine: 1.013 (ref 1.005–1.030)
pH: 6 (ref 5.0–8.0)

## 2018-01-21 LAB — PROTIME-INR
INR: 1.15
PROTHROMBIN TIME: 14.7 s (ref 11.4–15.2)

## 2018-01-21 LAB — CBC WITH DIFFERENTIAL/PLATELET
ABS IMMATURE GRANULOCYTES: 0 10*3/uL (ref 0.0–0.1)
BASOS ABS: 0 10*3/uL (ref 0.0–0.1)
Basophils Relative: 0 %
EOS ABS: 0.1 10*3/uL (ref 0.0–0.7)
EOS PCT: 2 %
HEMATOCRIT: 38 % — AB (ref 39.0–52.0)
HEMOGLOBIN: 12.7 g/dL — AB (ref 13.0–17.0)
Immature Granulocytes: 0 %
LYMPHS PCT: 33 %
Lymphs Abs: 1.8 10*3/uL (ref 0.7–4.0)
MCH: 33.6 pg (ref 26.0–34.0)
MCHC: 33.4 g/dL (ref 30.0–36.0)
MCV: 100.5 fL — ABNORMAL HIGH (ref 78.0–100.0)
Monocytes Absolute: 0.6 10*3/uL (ref 0.1–1.0)
Monocytes Relative: 10 %
Neutro Abs: 3.1 10*3/uL (ref 1.7–7.7)
Neutrophils Relative %: 55 %
Platelets: 102 10*3/uL — ABNORMAL LOW (ref 150–400)
RBC: 3.78 MIL/uL — ABNORMAL LOW (ref 4.22–5.81)
RDW: 14.3 % (ref 11.5–15.5)
WBC: 5.6 10*3/uL (ref 4.0–10.5)

## 2018-01-21 LAB — CBG MONITORING, ED: Glucose-Capillary: 164 mg/dL — ABNORMAL HIGH (ref 70–99)

## 2018-01-21 LAB — AMMONIA: Ammonia: 37 umol/L — ABNORMAL HIGH (ref 9–35)

## 2018-01-21 MED ORDER — SODIUM CHLORIDE 0.9 % IV SOLN
20.0000 mg/kg | Freq: Once | INTRAVENOUS | Status: AC
  Administered 2018-01-21: 1360 mg via INTRAVENOUS
  Filled 2018-01-21: qty 27.2

## 2018-01-21 MED ORDER — LORAZEPAM 2 MG/ML IJ SOLN
2.0000 mg | Freq: Once | INTRAMUSCULAR | Status: AC
  Administered 2018-01-21: 2 mg via INTRAVENOUS
  Filled 2018-01-21: qty 1

## 2018-01-21 MED ORDER — LEVETIRACETAM IN NACL 1000 MG/100ML IV SOLN
1000.0000 mg | Freq: Once | INTRAVENOUS | Status: AC
  Administered 2018-01-21: 1000 mg via INTRAVENOUS
  Filled 2018-01-21: qty 100

## 2018-01-21 MED ORDER — HALOPERIDOL LACTATE 5 MG/ML IJ SOLN
3.0000 mg | Freq: Once | INTRAMUSCULAR | Status: AC
  Administered 2018-01-22: 3 mg via INTRAVENOUS
  Filled 2018-01-21: qty 1

## 2018-01-21 MED ORDER — SODIUM CHLORIDE 0.9 % IV BOLUS
1000.0000 mL | Freq: Once | INTRAVENOUS | Status: AC
  Administered 2018-01-21: 1000 mL via INTRAVENOUS

## 2018-01-21 MED ORDER — LORAZEPAM 2 MG/ML IJ SOLN
INTRAMUSCULAR | Status: AC
  Filled 2018-01-21: qty 1

## 2018-01-21 MED ORDER — LORAZEPAM 2 MG/ML IJ SOLN
2.0000 mg | Freq: Once | INTRAMUSCULAR | Status: AC
  Administered 2018-01-21: 2 mg via INTRAVENOUS

## 2018-01-21 MED ORDER — HALOPERIDOL LACTATE 5 MG/ML IJ SOLN
2.0000 mg | Freq: Once | INTRAMUSCULAR | Status: AC
  Administered 2018-01-21: 2 mg via INTRAVENOUS
  Filled 2018-01-21: qty 1

## 2018-01-21 NOTE — ED Notes (Signed)
Pt continuing to attempt to get up and out of bed, unable to redirect. MD notified, see new orders.
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
## 2018-01-21 NOTE — ED Notes (Signed)
Pt unable to tolerate CT despite multiple attempts. Hypotensive at this time, Manual BP 86/46. Up to 91/56 when stimulated and laid on his back after 500ML NS given. Pt continues to be unable to tolerate laying on his back despite sedatives. MD made aware.

## 2018-01-21 NOTE — ED Notes (Signed)
Patient transported to CT 
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
## 2018-01-21 NOTE — ED Notes (Signed)
Pt alert but speech in incomprehensible, difficult to understand and redirect.

## 2018-01-22 ENCOUNTER — Emergency Department (HOSPITAL_COMMUNITY): Payer: Medicare Other

## 2018-01-22 ENCOUNTER — Encounter (HOSPITAL_COMMUNITY): Payer: Self-pay | Admitting: Internal Medicine

## 2018-01-22 ENCOUNTER — Inpatient Hospital Stay (HOSPITAL_COMMUNITY): Payer: Medicare Other | Admitting: Certified Registered Nurse Anesthetist

## 2018-01-22 ENCOUNTER — Encounter (HOSPITAL_COMMUNITY)
Admission: EM | Disposition: A | Discharge status: Home Health | Payer: Self-pay | Source: Home / Self Care | Attending: Pulmonary Disease

## 2018-01-22 ENCOUNTER — Other Ambulatory Visit (HOSPITAL_COMMUNITY): Payer: Medicare Other

## 2018-01-22 DIAGNOSIS — R569 Unspecified convulsions: Secondary | ICD-10-CM | POA: Diagnosis not present

## 2018-01-22 DIAGNOSIS — Z794 Long term (current) use of insulin: Secondary | ICD-10-CM | POA: Diagnosis not present

## 2018-01-22 DIAGNOSIS — S065X9A Traumatic subdural hemorrhage with loss of consciousness of unspecified duration, initial encounter: Principal | ICD-10-CM

## 2018-01-22 DIAGNOSIS — K7030 Alcoholic cirrhosis of liver without ascites: Secondary | ICD-10-CM | POA: Diagnosis not present

## 2018-01-22 DIAGNOSIS — I619 Nontraumatic intracerebral hemorrhage, unspecified: Secondary | ICD-10-CM | POA: Diagnosis not present

## 2018-01-22 DIAGNOSIS — R561 Post traumatic seizures: Secondary | ICD-10-CM | POA: Diagnosis not present

## 2018-01-22 DIAGNOSIS — E871 Hypo-osmolality and hyponatremia: Secondary | ICD-10-CM | POA: Diagnosis not present

## 2018-01-22 DIAGNOSIS — I493 Ventricular premature depolarization: Secondary | ICD-10-CM | POA: Diagnosis present

## 2018-01-22 DIAGNOSIS — Z4682 Encounter for fitting and adjustment of non-vascular catheter: Secondary | ICD-10-CM | POA: Diagnosis not present

## 2018-01-22 DIAGNOSIS — F14129 Cocaine abuse with intoxication, unspecified: Secondary | ICD-10-CM | POA: Diagnosis present

## 2018-01-22 DIAGNOSIS — E7220 Disorder of urea cycle metabolism, unspecified: Secondary | ICD-10-CM | POA: Diagnosis not present

## 2018-01-22 DIAGNOSIS — E872 Acidosis: Secondary | ICD-10-CM | POA: Diagnosis present

## 2018-01-22 DIAGNOSIS — F1410 Cocaine abuse, uncomplicated: Secondary | ICD-10-CM

## 2018-01-22 DIAGNOSIS — Y92230 Patient room in hospital as the place of occurrence of the external cause: Secondary | ICD-10-CM | POA: Diagnosis not present

## 2018-01-22 DIAGNOSIS — T4275XA Adverse effect of unspecified antiepileptic and sedative-hypnotic drugs, initial encounter: Secondary | ICD-10-CM | POA: Diagnosis not present

## 2018-01-22 DIAGNOSIS — G40901 Epilepsy, unspecified, not intractable, with status epilepticus: Secondary | ICD-10-CM | POA: Diagnosis not present

## 2018-01-22 DIAGNOSIS — I10 Essential (primary) hypertension: Secondary | ICD-10-CM | POA: Diagnosis not present

## 2018-01-22 DIAGNOSIS — Y900 Blood alcohol level of less than 20 mg/100 ml: Secondary | ICD-10-CM | POA: Diagnosis present

## 2018-01-22 DIAGNOSIS — Z781 Physical restraint status: Secondary | ICD-10-CM | POA: Diagnosis not present

## 2018-01-22 DIAGNOSIS — E43 Unspecified severe protein-calorie malnutrition: Secondary | ICD-10-CM | POA: Diagnosis present

## 2018-01-22 DIAGNOSIS — I952 Hypotension due to drugs: Secondary | ICD-10-CM | POA: Diagnosis not present

## 2018-01-22 DIAGNOSIS — G40109 Localization-related (focal) (partial) symptomatic epilepsy and epileptic syndromes with simple partial seizures, not intractable, without status epilepticus: Secondary | ICD-10-CM | POA: Diagnosis not present

## 2018-01-22 DIAGNOSIS — S069X3S Unspecified intracranial injury with loss of consciousness of 1 hour to 5 hours 59 minutes, sequela: Secondary | ICD-10-CM | POA: Diagnosis not present

## 2018-01-22 DIAGNOSIS — S065X0A Traumatic subdural hemorrhage without loss of consciousness, initial encounter: Secondary | ICD-10-CM | POA: Diagnosis not present

## 2018-01-22 DIAGNOSIS — Z4659 Encounter for fitting and adjustment of other gastrointestinal appliance and device: Secondary | ICD-10-CM | POA: Diagnosis not present

## 2018-01-22 DIAGNOSIS — B9620 Unspecified Escherichia coli [E. coli] as the cause of diseases classified elsewhere: Secondary | ICD-10-CM | POA: Diagnosis not present

## 2018-01-22 DIAGNOSIS — Z9119 Patient's noncompliance with other medical treatment and regimen: Secondary | ICD-10-CM | POA: Diagnosis not present

## 2018-01-22 DIAGNOSIS — I6200 Nontraumatic subdural hemorrhage, unspecified: Secondary | ICD-10-CM | POA: Diagnosis not present

## 2018-01-22 DIAGNOSIS — R4182 Altered mental status, unspecified: Secondary | ICD-10-CM | POA: Diagnosis not present

## 2018-01-22 DIAGNOSIS — N390 Urinary tract infection, site not specified: Secondary | ICD-10-CM | POA: Diagnosis not present

## 2018-01-22 DIAGNOSIS — R0602 Shortness of breath: Secondary | ICD-10-CM | POA: Diagnosis not present

## 2018-01-22 DIAGNOSIS — I517 Cardiomegaly: Secondary | ICD-10-CM | POA: Diagnosis not present

## 2018-01-22 DIAGNOSIS — D539 Nutritional anemia, unspecified: Secondary | ICD-10-CM | POA: Diagnosis present

## 2018-01-22 DIAGNOSIS — B1920 Unspecified viral hepatitis C without hepatic coma: Secondary | ICD-10-CM | POA: Diagnosis present

## 2018-01-22 DIAGNOSIS — E119 Type 2 diabetes mellitus without complications: Secondary | ICD-10-CM | POA: Diagnosis present

## 2018-01-22 DIAGNOSIS — F17210 Nicotine dependence, cigarettes, uncomplicated: Secondary | ICD-10-CM | POA: Diagnosis present

## 2018-01-22 DIAGNOSIS — R471 Dysarthria and anarthria: Secondary | ICD-10-CM | POA: Diagnosis not present

## 2018-01-22 DIAGNOSIS — F1910 Other psychoactive substance abuse, uncomplicated: Secondary | ICD-10-CM | POA: Diagnosis not present

## 2018-01-22 DIAGNOSIS — F10239 Alcohol dependence with withdrawal, unspecified: Secondary | ICD-10-CM | POA: Diagnosis not present

## 2018-01-22 DIAGNOSIS — G9341 Metabolic encephalopathy: Secondary | ICD-10-CM | POA: Diagnosis present

## 2018-01-22 DIAGNOSIS — D696 Thrombocytopenia, unspecified: Secondary | ICD-10-CM | POA: Diagnosis not present

## 2018-01-22 DIAGNOSIS — G40101 Localization-related (focal) (partial) symptomatic epilepsy and epileptic syndromes with simple partial seizures, not intractable, with status epilepticus: Secondary | ICD-10-CM | POA: Diagnosis present

## 2018-01-22 DIAGNOSIS — J9600 Acute respiratory failure, unspecified whether with hypoxia or hypercapnia: Secondary | ICD-10-CM | POA: Diagnosis not present

## 2018-01-22 DIAGNOSIS — J9811 Atelectasis: Secondary | ICD-10-CM | POA: Diagnosis not present

## 2018-01-22 HISTORY — PX: BURR HOLE: SHX908

## 2018-01-22 LAB — HEPATIC FUNCTION PANEL
ALT: 36 U/L (ref 0–44)
AST: 49 U/L — ABNORMAL HIGH (ref 15–41)
Albumin: 2.1 g/dL — ABNORMAL LOW (ref 3.5–5.0)
Alkaline Phosphatase: 104 U/L (ref 38–126)
Bilirubin, Direct: 0.3 mg/dL — ABNORMAL HIGH (ref 0.0–0.2)
Indirect Bilirubin: 0.5 mg/dL (ref 0.3–0.9)
Total Bilirubin: 0.8 mg/dL (ref 0.3–1.2)
Total Protein: 6.5 g/dL (ref 6.5–8.1)

## 2018-01-22 LAB — BASIC METABOLIC PANEL
Anion gap: 6 (ref 5–15)
BUN: 15 mg/dL (ref 8–23)
CALCIUM: 8.1 mg/dL — AB (ref 8.9–10.3)
CO2: 22 mmol/L (ref 22–32)
Chloride: 110 mmol/L (ref 98–111)
Creatinine, Ser: 0.79 mg/dL (ref 0.61–1.24)
GFR calc Af Amer: >60 mL/min (ref >60)
GFR calc non Af Amer: >60 mL/min (ref >60)
Glucose, Bld: 136 mg/dL — ABNORMAL HIGH (ref 70–99)
Potassium: 4.2 mmol/L (ref 3.5–5.1)
Sodium: 138 mmol/L (ref 135–145)

## 2018-01-22 LAB — CBC WITH DIFFERENTIAL/PLATELET
Abs Immature Granulocytes: 0 10*3/uL (ref 0.0–0.1)
BASOS PCT: 0 %
Basophils Absolute: 0 10*3/uL (ref 0.0–0.1)
EOS ABS: 0.1 10*3/uL (ref 0.0–0.7)
Eosinophils Relative: 1 %
HCT: 34.5 % — ABNORMAL LOW (ref 39.0–52.0)
Hemoglobin: 11.8 g/dL — ABNORMAL LOW (ref 13.0–17.0)
IMMATURE GRANULOCYTES: 0 %
Lymphocytes Relative: 41 %
Lymphs Abs: 2.6 10*3/uL (ref 0.7–4.0)
MCH: 33.8 pg (ref 26.0–34.0)
MCHC: 34.2 g/dL (ref 30.0–36.0)
MCV: 98.9 fL (ref 78.0–100.0)
Monocytes Absolute: 0.5 10*3/uL (ref 0.1–1.0)
Monocytes Relative: 8 %
Neutro Abs: 3.2 10*3/uL (ref 1.7–7.7)
Neutrophils Relative %: 50 %
PLATELETS: 102 10*3/uL — AB (ref 150–400)
RBC: 3.49 MIL/uL — ABNORMAL LOW (ref 4.22–5.81)
RDW: 14.3 % (ref 11.5–15.5)
WBC: 6.4 10*3/uL (ref 4.0–10.5)

## 2018-01-22 LAB — AMMONIA: Ammonia: 41 umol/L — ABNORMAL HIGH (ref 9–35)

## 2018-01-22 LAB — FERRITIN: FERRITIN: 256 ng/mL (ref 24–336)

## 2018-01-22 LAB — IRON AND TIBC
IRON: 110 ug/dL (ref 45–182)
Saturation Ratios: 37 % (ref 17.9–39.5)
TIBC: 300 ug/dL (ref 250–450)
UIBC: 190 ug/dL

## 2018-01-22 LAB — VITAMIN B12: Vitamin B-12: 644 pg/mL (ref 180–914)

## 2018-01-22 LAB — GLUCOSE, CAPILLARY
GLUCOSE-CAPILLARY: 151 mg/dL — AB (ref 70–99)
Glucose-Capillary: 106 mg/dL — ABNORMAL HIGH (ref 70–99)
Glucose-Capillary: 108 mg/dL — ABNORMAL HIGH (ref 70–99)
Glucose-Capillary: 122 mg/dL — ABNORMAL HIGH (ref 70–99)
Glucose-Capillary: 156 mg/dL — ABNORMAL HIGH (ref 70–99)
Glucose-Capillary: 161 mg/dL — ABNORMAL HIGH (ref 70–99)

## 2018-01-22 LAB — MAGNESIUM
MAGNESIUM: 1.5 mg/dL — AB (ref 1.7–2.4)
Magnesium: 1.4 mg/dL — ABNORMAL LOW (ref 1.7–2.4)

## 2018-01-22 LAB — RETICULOCYTES
RBC.: 3.64 MIL/uL — ABNORMAL LOW (ref 4.22–5.81)
RETIC CT PCT: 2.3 % (ref 0.4–3.1)
Retic Count, Absolute: 83.7 10*3/uL (ref 19.0–186.0)

## 2018-01-22 LAB — LACTIC ACID, PLASMA: LACTIC ACID, VENOUS: 1.7 mmol/L (ref 0.5–1.9)

## 2018-01-22 LAB — ETHANOL: Alcohol, Ethyl (B): <10 mg/dL (ref <10)

## 2018-01-22 LAB — FOLATE: Folate: 13 ng/mL (ref >5.9)

## 2018-01-22 SURGERY — CREATION, CRANIAL BURR HOLE
Anesthesia: General | Laterality: Right

## 2018-01-22 MED ORDER — INSULIN ASPART 100 UNIT/ML ~~LOC~~ SOLN
0.0000 [IU] | SUBCUTANEOUS | Status: DC
Start: 2018-01-22 — End: 2018-01-31
  Administered 2018-01-23 (×3): 1 [IU] via SUBCUTANEOUS
  Administered 2018-01-23: 2 [IU] via SUBCUTANEOUS
  Administered 2018-01-24 – 2018-01-25 (×5): 1 [IU] via SUBCUTANEOUS
  Administered 2018-01-26: 2 [IU] via SUBCUTANEOUS
  Administered 2018-01-26: 1 [IU] via SUBCUTANEOUS
  Administered 2018-01-26 – 2018-01-28 (×8): 2 [IU] via SUBCUTANEOUS
  Administered 2018-01-28: 1 [IU] via SUBCUTANEOUS
  Administered 2018-01-28: 3 [IU] via SUBCUTANEOUS
  Administered 2018-01-28: 1 [IU] via SUBCUTANEOUS
  Administered 2018-01-28: 3 [IU] via SUBCUTANEOUS
  Administered 2018-01-28 – 2018-01-29 (×2): 2 [IU] via SUBCUTANEOUS
  Administered 2018-01-29: 3 [IU] via SUBCUTANEOUS
  Administered 2018-01-29: 2 [IU] via SUBCUTANEOUS
  Administered 2018-01-29 (×2): 1 [IU] via SUBCUTANEOUS
  Administered 2018-01-29: 3 [IU] via SUBCUTANEOUS
  Administered 2018-01-30: 5 [IU] via SUBCUTANEOUS
  Administered 2018-01-30: 3 [IU] via SUBCUTANEOUS
  Administered 2018-01-30: 1 [IU] via SUBCUTANEOUS
  Administered 2018-01-30 (×3): 3 [IU] via SUBCUTANEOUS
  Administered 2018-01-30: 2 [IU] via SUBCUTANEOUS
  Administered 2018-01-31: 1 [IU] via SUBCUTANEOUS

## 2018-01-22 MED ORDER — CEFAZOLIN SODIUM-DEXTROSE 2-4 GM/100ML-% IV SOLN
INTRAVENOUS | Status: AC
  Filled 2018-01-22: qty 100

## 2018-01-22 MED ORDER — ACETAMINOPHEN 325 MG PO TABS: 650.0000 mg | ORAL_TABLET | Freq: Four times a day (QID) | ORAL | Status: DC | PRN

## 2018-01-22 MED ORDER — ACETAMINOPHEN 325 MG PO TABS
650.0000 mg | ORAL_TABLET | ORAL | Status: DC | PRN
  Administered 2018-01-29 – 2018-01-31 (×3): 650 mg via ORAL
  Filled 2018-01-22 (×3): qty 2

## 2018-01-22 MED ORDER — INSULIN ASPART 100 UNIT/ML ~~LOC~~ SOLN: 0.0000 [IU] | Freq: Three times a day (TID) | SUBCUTANEOUS | Status: DC

## 2018-01-22 MED ORDER — FOLIC ACID 1 MG PO TABS
1.0000 mg | ORAL_TABLET | Freq: Every day | ORAL | Status: DC
  Administered 2018-01-22: 1 mg via ORAL
  Filled 2018-01-22: qty 1

## 2018-01-22 MED ORDER — ALPRAZOLAM 0.5 MG PO TABS: 1.0000 mg | ORAL_TABLET | Freq: Every evening | ORAL | Status: DC | PRN

## 2018-01-22 MED ORDER — BACITRACIN ZINC 500 UNIT/GM EX OINT
TOPICAL_OINTMENT | CUTANEOUS | Status: AC
  Filled 2018-01-22: qty 28.35

## 2018-01-22 MED ORDER — ADULT MULTIVITAMIN W/MINERALS CH
1.0000 | ORAL_TABLET | Freq: Every day | ORAL | Status: DC
  Administered 2018-01-22: 1 via ORAL
  Filled 2018-01-22: qty 1

## 2018-01-22 MED ORDER — ORAL CARE MOUTH RINSE
15.0000 mL | Freq: Two times a day (BID) | OROMUCOSAL | Status: DC
  Administered 2018-01-22 – 2018-01-27 (×9): 15 mL via OROMUCOSAL

## 2018-01-22 MED ORDER — FENTANYL CITRATE (PF) 250 MCG/5ML IJ SOLN
INTRAMUSCULAR | Status: AC
  Filled 2018-01-22: qty 5

## 2018-01-22 MED ORDER — MORPHINE SULFATE (PF) 2 MG/ML IV SOLN
1.0000 mg | INTRAVENOUS | Status: DC | PRN
  Administered 2018-01-22 – 2018-01-25 (×11): 2 mg via INTRAVENOUS
  Administered 2018-01-26 – 2018-01-27 (×2): 1 mg via INTRAVENOUS
  Filled 2018-01-22 (×14): qty 1

## 2018-01-22 MED ORDER — BACITRACIN ZINC 500 UNIT/GM EX OINT
TOPICAL_OINTMENT | CUTANEOUS | Status: DC | PRN
  Administered 2018-01-22: 1 via TOPICAL

## 2018-01-22 MED ORDER — ONDANSETRON HCL 4 MG/2ML IJ SOLN: 4.0000 mg | Freq: Four times a day (QID) | INTRAMUSCULAR | Status: DC | PRN

## 2018-01-22 MED ORDER — FENTANYL CITRATE (PF) 100 MCG/2ML IJ SOLN
INTRAMUSCULAR | Status: DC | PRN
  Administered 2018-01-22: 100 ug via INTRAVENOUS

## 2018-01-22 MED ORDER — LEVETIRACETAM IN NACL 500 MG/100ML IV SOLN: 500.0000 mg | Freq: Two times a day (BID) | INTRAVENOUS | Status: DC

## 2018-01-22 MED ORDER — ONDANSETRON HCL 4 MG PO TABS: 4.0000 mg | ORAL_TABLET | Freq: Four times a day (QID) | ORAL | Status: DC | PRN

## 2018-01-22 MED ORDER — BUPIVACAINE-EPINEPHRINE (PF) 0.25% -1:200000 IJ SOLN
INTRAMUSCULAR | Status: DC | PRN
  Administered 2018-01-22: 10 mL via PERINEURAL

## 2018-01-22 MED ORDER — INSULIN ASPART 100 UNIT/ML ~~LOC~~ SOLN
0.0000 [IU] | SUBCUTANEOUS | Status: DC
  Administered 2018-01-22: 2 [IU] via SUBCUTANEOUS

## 2018-01-22 MED ORDER — LABETALOL HCL 5 MG/ML IV SOLN: 10.0000 mg | INTRAVENOUS | Status: DC | PRN

## 2018-01-22 MED ORDER — SUGAMMADEX SODIUM 200 MG/2ML IV SOLN
INTRAVENOUS | Status: DC | PRN
  Administered 2018-01-22: 400 mg via INTRAVENOUS

## 2018-01-22 MED ORDER — THIAMINE HCL 100 MG/ML IJ SOLN
100.0000 mg | Freq: Every day | INTRAMUSCULAR | Status: DC
  Administered 2018-01-23 – 2018-01-25 (×3): 100 mg via INTRAVENOUS
  Filled 2018-01-22 (×3): qty 2

## 2018-01-22 MED ORDER — HEMOSTATIC AGENTS (NO CHARGE) OPTIME
TOPICAL | Status: DC | PRN
  Administered 2018-01-22: 1 via TOPICAL

## 2018-01-22 MED ORDER — VITAMIN B-1 100 MG PO TABS
100.0000 mg | ORAL_TABLET | Freq: Every day | ORAL | Status: DC
  Administered 2018-01-22: 100 mg via ORAL
  Filled 2018-01-22: qty 1

## 2018-01-22 MED ORDER — BUPIVACAINE-EPINEPHRINE (PF) 0.25% -1:200000 IJ SOLN
INTRAMUSCULAR | Status: AC
  Filled 2018-01-22: qty 30

## 2018-01-22 MED ORDER — POTASSIUM CHLORIDE IN NACL 20-0.9 MEQ/L-% IV SOLN
INTRAVENOUS | Status: DC
  Administered 2018-01-22 – 2018-01-26 (×7): via INTRAVENOUS
  Administered 2018-01-26 – 2018-01-27 (×2): 1000 mL via INTRAVENOUS
  Administered 2018-01-27: 20:00:00 via INTRAVENOUS
  Filled 2018-01-22 (×11): qty 1000

## 2018-01-22 MED ORDER — LEVETIRACETAM IN NACL 1000 MG/100ML IV SOLN
1000.0000 mg | Freq: Two times a day (BID) | INTRAVENOUS | Status: DC
  Administered 2018-01-22: 1000 mg via INTRAVENOUS
  Filled 2018-01-22 (×3): qty 100

## 2018-01-22 MED ORDER — ONDANSETRON HCL 4 MG/2ML IJ SOLN: 4.0000 mg | INTRAMUSCULAR | Status: DC | PRN

## 2018-01-22 MED ORDER — ROCURONIUM BROMIDE 100 MG/10ML IV SOLN
INTRAVENOUS | Status: DC | PRN
  Administered 2018-01-22: 50 mg via INTRAVENOUS

## 2018-01-22 MED ORDER — THROMBIN (RECOMBINANT) 20000 UNITS EX SOLR
CUTANEOUS | Status: AC
  Filled 2018-01-22: qty 20000

## 2018-01-22 MED ORDER — LORAZEPAM 1 MG PO TABS: 1.0000 mg | ORAL_TABLET | Freq: Four times a day (QID) | ORAL | Status: AC | PRN

## 2018-01-22 MED ORDER — CEFAZOLIN SODIUM-DEXTROSE 2-3 GM-%(50ML) IV SOLR
INTRAVENOUS | Status: DC | PRN
  Administered 2018-01-22: 2 g via INTRAVENOUS

## 2018-01-22 MED ORDER — ACETAMINOPHEN 650 MG RE SUPP
650.0000 mg | RECTAL | Status: DC | PRN
  Administered 2018-01-23: 650 mg via RECTAL
  Filled 2018-01-22: qty 1

## 2018-01-22 MED ORDER — ACETAMINOPHEN 650 MG RE SUPP: 650.0000 mg | Freq: Four times a day (QID) | RECTAL | Status: DC | PRN

## 2018-01-22 MED ORDER — HYDRALAZINE HCL 20 MG/ML IJ SOLN: 10.0000 mg | INTRAMUSCULAR | Status: DC | PRN

## 2018-01-22 MED ORDER — FENTANYL CITRATE (PF) 100 MCG/2ML IJ SOLN: 25.0000 ug | INTRAMUSCULAR | Status: DC | PRN

## 2018-01-22 MED ORDER — PHENYTOIN SODIUM 50 MG/ML IJ SOLN
100.0000 mg | Freq: Three times a day (TID) | INTRAMUSCULAR | Status: DC
  Administered 2018-01-22 – 2018-01-26 (×13): 100 mg via INTRAVENOUS
  Filled 2018-01-22 (×13): qty 2

## 2018-01-22 MED ORDER — SODIUM CHLORIDE 0.9 % IV SOLN
INTRAVENOUS | Status: DC | PRN
  Administered 2018-01-22: 500 mL

## 2018-01-22 MED ORDER — ONDANSETRON HCL 4 MG/2ML IJ SOLN
INTRAMUSCULAR | Status: DC | PRN
  Administered 2018-01-22: 4 mg via INTRAVENOUS

## 2018-01-22 MED ORDER — PROPOFOL 10 MG/ML IV BOLUS
INTRAVENOUS | Status: AC
  Filled 2018-01-22: qty 20

## 2018-01-22 MED ORDER — ALBUMIN HUMAN 5 % IV SOLN
INTRAVENOUS | Status: DC | PRN
  Administered 2018-01-22: 17:00:00 via INTRAVENOUS

## 2018-01-22 MED ORDER — SODIUM CHLORIDE 0.9 % IV SOLN
INTRAVENOUS | Status: DC
  Administered 2018-01-22 (×2): via INTRAVENOUS

## 2018-01-22 MED ORDER — MAGNESIUM SULFATE 2 GM/50ML IV SOLN
2.0000 g | Freq: Once | INTRAVENOUS | Status: AC
  Administered 2018-01-22: 2 g via INTRAVENOUS
  Filled 2018-01-22: qty 50

## 2018-01-22 MED ORDER — SUGAMMADEX SODIUM 500 MG/5ML IV SOLN
INTRAVENOUS | Status: AC
  Filled 2018-01-22: qty 5

## 2018-01-22 MED ORDER — CEFAZOLIN SODIUM-DEXTROSE 2-4 GM/100ML-% IV SOLN
2.0000 g | Freq: Three times a day (TID) | INTRAVENOUS | Status: AC
  Administered 2018-01-22 – 2018-01-23 (×2): 2 g via INTRAVENOUS
  Filled 2018-01-22 (×3): qty 100

## 2018-01-22 MED ORDER — FAMOTIDINE IN NACL 20-0.9 MG/50ML-% IV SOLN
20.0000 mg | Freq: Two times a day (BID) | INTRAVENOUS | Status: DC
  Administered 2018-01-22 – 2018-02-02 (×22): 20 mg via INTRAVENOUS
  Filled 2018-01-22 (×22): qty 50

## 2018-01-22 MED ORDER — LORAZEPAM 2 MG/ML IJ SOLN
0.0000 mg | Freq: Four times a day (QID) | INTRAMUSCULAR | Status: DC
  Administered 2018-01-22 – 2018-01-24 (×5): 2 mg via INTRAVENOUS
  Filled 2018-01-22: qty 1
  Filled 2018-01-22: qty 2
  Filled 2018-01-22 (×3): qty 1

## 2018-01-22 MED ORDER — LORAZEPAM 2 MG/ML IJ SOLN
0.0000 mg | Freq: Two times a day (BID) | INTRAMUSCULAR | Status: DC
  Administered 2018-01-24: 2 mg via INTRAVENOUS
  Filled 2018-01-22: qty 1

## 2018-01-22 MED ORDER — 0.9 % SODIUM CHLORIDE (POUR BTL) OPTIME
TOPICAL | Status: DC | PRN
Start: 2018-01-22 — End: 2018-01-22
  Administered 2018-01-22 (×3): 1000 mL

## 2018-01-22 MED ORDER — INSULIN GLARGINE 100 UNIT/ML ~~LOC~~ SOLN: 20.0000 [IU] | Freq: Every day | SUBCUTANEOUS | Status: DC

## 2018-01-22 MED ORDER — ONDANSETRON HCL 4 MG/2ML IJ SOLN: 4.0000 mg | Freq: Once | INTRAMUSCULAR | Status: DC | PRN

## 2018-01-22 MED ORDER — PROPOFOL 10 MG/ML IV BOLUS
INTRAVENOUS | Status: DC | PRN
  Administered 2018-01-22: 140 mg via INTRAVENOUS

## 2018-01-22 MED ORDER — PROMETHAZINE HCL 25 MG PO TABS: 12.5000 mg | ORAL_TABLET | ORAL | Status: DC | PRN

## 2018-01-22 MED ORDER — ONDANSETRON HCL 4 MG PO TABS: 4.0000 mg | ORAL_TABLET | ORAL | Status: DC | PRN

## 2018-01-22 MED ORDER — FOSPHENYTOIN SODIUM 500 MG PE/10ML IJ SOLN
20.0000 mg/kg | Freq: Once | INTRAMUSCULAR | Status: AC
  Administered 2018-01-22: 1464 mg via INTRAVENOUS
  Filled 2018-01-22: qty 29.28

## 2018-01-22 MED ORDER — THROMBIN 5000 UNITS EX SOLR
OROMUCOSAL | Status: DC | PRN
  Administered 2018-01-22: 17:00:00 via TOPICAL

## 2018-01-22 MED ORDER — LORAZEPAM 2 MG/ML IJ SOLN
1.0000 mg | Freq: Four times a day (QID) | INTRAMUSCULAR | Status: AC | PRN
  Administered 2018-01-22 – 2018-01-25 (×3): 1 mg via INTRAVENOUS
  Filled 2018-01-22 (×5): qty 1

## 2018-01-22 MED ORDER — PHENYLEPHRINE HCL 10 MG/ML IJ SOLN
INTRAMUSCULAR | Status: DC | PRN
  Administered 2018-01-22 (×7): 120 ug via INTRAVENOUS

## 2018-01-22 MED ORDER — SUCCINYLCHOLINE CHLORIDE 20 MG/ML IJ SOLN
INTRAMUSCULAR | Status: DC | PRN
  Administered 2018-01-22: 160 mg via INTRAVENOUS

## 2018-01-22 MED ORDER — THROMBIN 5000 UNITS EX SOLR
CUTANEOUS | Status: AC
  Filled 2018-01-22: qty 5000

## 2018-01-22 MED ORDER — LIDOCAINE HCL (CARDIAC) PF 100 MG/5ML IV SOSY
PREFILLED_SYRINGE | INTRAVENOUS | Status: DC | PRN
  Administered 2018-01-22: 40 mg via INTRAVENOUS

## 2018-01-22 MED ORDER — PHENYLEPHRINE 40 MCG/ML (10ML) SYRINGE FOR IV PUSH (FOR BLOOD PRESSURE SUPPORT)
PREFILLED_SYRINGE | INTRAVENOUS | Status: AC
  Filled 2018-01-22: qty 30

## 2018-01-22 SURGICAL SUPPLY — 64 items
BAG DECANTER FOR FLEXI CONT (MISCELLANEOUS) ×2 IMPLANT
BIT DRILL WIRE PASS 1.3MM (BIT) IMPLANT
BLADE CLIPPER SPEC (BLADE) ×2 IMPLANT
BUR PRECISION FLUTE 6.0 (BURR) ×2 IMPLANT
BUR SPIRAL ROUTER 2.3 (BUR) IMPLANT
CANISTER SUCT 3000ML PPV (MISCELLANEOUS) ×4 IMPLANT
CARTRIDGE OIL MAESTRO DRILL (MISCELLANEOUS) ×1 IMPLANT
CLIP VESOCCLUDE MED 6/CT (CLIP) IMPLANT
COVER BACK TABLE 60X90IN (DRAPES) IMPLANT
DIFFUSER DRILL AIR PNEUMATIC (MISCELLANEOUS) ×2 IMPLANT
DRAPE NEUROLOGICAL W/INCISE (DRAPES) ×2 IMPLANT
DRAPE SURG 17X23 STRL (DRAPES) IMPLANT
DRAPE WARM FLUID 44X44 (DRAPE) ×2 IMPLANT
DRILL WIRE PASS 1.3MM (BIT)
DRSG OPSITE POSTOP 3X4 (GAUZE/BANDAGES/DRESSINGS) ×4 IMPLANT
ELECT REM PT RETURN 9FT ADLT (ELECTROSURGICAL) ×2
ELECTRODE REM PT RTRN 9FT ADLT (ELECTROSURGICAL) ×1 IMPLANT
EVACUATOR 1/8 PVC DRAIN (DRAIN) IMPLANT
EVACUATOR SILICONE 100CC (DRAIN) IMPLANT
GAUZE SPONGE 4X4 12PLY STRL (GAUZE/BANDAGES/DRESSINGS) IMPLANT
GAUZE SPONGE 4X4 16PLY XRAY LF (GAUZE/BANDAGES/DRESSINGS) IMPLANT
GLOVE BIO SURGEON STRL SZ8 (GLOVE) ×2 IMPLANT
GLOVE BIO SURGEON STRL SZ8.5 (GLOVE) ×2 IMPLANT
GLOVE BIOGEL PI IND STRL 6.5 (GLOVE) ×1 IMPLANT
GLOVE BIOGEL PI IND STRL 7.0 (GLOVE) ×1 IMPLANT
GLOVE BIOGEL PI IND STRL 7.5 (GLOVE) ×1 IMPLANT
GLOVE BIOGEL PI INDICATOR 6.5 (GLOVE) ×1
GLOVE BIOGEL PI INDICATOR 7.0 (GLOVE) ×1
GLOVE BIOGEL PI INDICATOR 7.5 (GLOVE) ×1
GLOVE EXAM NITRILE LRG STRL (GLOVE) IMPLANT
GLOVE EXAM NITRILE XL STR (GLOVE) IMPLANT
GLOVE EXAM NITRILE XS STR PU (GLOVE) IMPLANT
GLOVE SURG SS PI 6.0 STRL IVOR (GLOVE) ×2 IMPLANT
GOWN STRL REUS W/ TWL LRG LVL3 (GOWN DISPOSABLE) ×1 IMPLANT
GOWN STRL REUS W/ TWL XL LVL3 (GOWN DISPOSABLE) ×2 IMPLANT
GOWN STRL REUS W/TWL LRG LVL3 (GOWN DISPOSABLE) ×1
GOWN STRL REUS W/TWL XL LVL3 (GOWN DISPOSABLE) ×2
KIT BASIN OR (CUSTOM PROCEDURE TRAY) ×2 IMPLANT
KIT TURNOVER KIT B (KITS) ×2 IMPLANT
MARKER SKIN DUAL TIP RULER LAB (MISCELLANEOUS) IMPLANT
NEEDLE HYPO 22GX1.5 SAFETY (NEEDLE) ×2 IMPLANT
NS IRRIG 1000ML POUR BTL (IV SOLUTION) ×4 IMPLANT
OIL CARTRIDGE MAESTRO DRILL (MISCELLANEOUS) ×2
PACK CRANIOTOMY CUSTOM (CUSTOM PROCEDURE TRAY) ×2 IMPLANT
PAD ARMBOARD 7.5X6 YLW CONV (MISCELLANEOUS) ×6 IMPLANT
PATTIES SURGICAL .25X.25 (GAUZE/BANDAGES/DRESSINGS) IMPLANT
PATTIES SURGICAL .5 X.5 (GAUZE/BANDAGES/DRESSINGS) IMPLANT
PATTIES SURGICAL .5 X3 (DISPOSABLE) IMPLANT
PATTIES SURGICAL 1X1 (DISPOSABLE) IMPLANT
PIN MAYFIELD SKULL DISP (PIN) ×2 IMPLANT
RUBBERBAND STERILE (MISCELLANEOUS) IMPLANT
SPONGE NEURO XRAY DETECT 1X3 (DISPOSABLE) IMPLANT
STAPLER SKIN PROX WIDE 3.9 (STAPLE) ×2 IMPLANT
SUT ETHILON 3 0 FSL (SUTURE) IMPLANT
SUT NURALON 4 0 TR CR/8 (SUTURE) ×2 IMPLANT
SUT PROLENE 6 0 BV (SUTURE) IMPLANT
SUT VIC AB 2-0 CP2 18 (SUTURE) ×2 IMPLANT
SUT VIC AB 3-0 FS2 27 (SUTURE) IMPLANT
SUT VICRYL 4-0 PS2 18IN ABS (SUTURE) IMPLANT
TOWEL GREEN STERILE (TOWEL DISPOSABLE) ×2 IMPLANT
TOWEL GREEN STERILE FF (TOWEL DISPOSABLE) ×2 IMPLANT
TRAY FOLEY MTR SLVR 16FR STAT (SET/KITS/TRAYS/PACK) IMPLANT
UNDERPAD 30X30 (UNDERPADS AND DIAPERS) IMPLANT
WATER STERILE IRR 1000ML POUR (IV SOLUTION) ×2 IMPLANT

## 2018-01-22 NOTE — Progress Notes (Signed)
Patient's girlfriend April called however she is not on HIPPA list so explained I am unable to give her any information. She states Mr Maker's brother and mother are on the way to the hospital and should be arriving around noon. Will page Dr Arnoldo Morale upon arrival. Patient has remained NPO this shift.

## 2018-01-22 NOTE — Progress Notes (Signed)
The patient's mother and sister have arrived and are at the bedside.  I have discussed the situation with him.  We have discussed his seizures.  There are metabolic reasons why could be having seizures such as his hepatic disease, history of alcohol abuse, etc.  But of course the seizures could be coming from his right subdural hematoma.  We have discussed the various treatment options including right bur holes for evacuation of the subdural hematoma.  I have described that surgery to them.  We have discussed the risks of surgery including risk of anesthesia, hemorrhage, infection, adequate risk, recurrent subdural hematoma, seizures, etc.  I have answered all their questions.  They have consented on behalf of the patient.

## 2018-01-22 NOTE — Progress Notes (Addendum)
Subjective: Current time patient is in a four-point restraint.  He is very agitated and continues to perseverate on the fact that he is hungry and wants something to eat.  He does not answer any my questions and follows minimal commands.  He is holding his eyes completely shut and will not let me look at his eyes.  He does wiggle his toes to command.  Again when I asked how he is doing he just perseverates on the fact that he is hungry.  Exam: Vitals:   01/22/18 0315 01/22/18 0740  BP:  121/69  Pulse: 62 68  Resp: 19 18  Temp:  97.8 F (36.6 C)  SpO2: 100%     Physical Exam   HEENT-  Normocephalic, no lesions, without obvious abnormality.  Normal external eye and conjunctiva.   Cardiovascular- S1-S2 audible, pulses palpable throughout   Musculoskeletal-no joint tenderness, deformity or swelling Skin-warm and dry, no hyperpigmentation, vitiligo, or suspicious lesions    Neuro:  Mental Status: Patient is not oriented and when asking orientation he basically does not answer any questions as far as orientation.  As noted above he perseverates on the fact that he is hungry and at times difficult to understand as he is extremely dysarthric voice.  He is trying to get out of his four-point restraints. Cranial Nerves: II: When eyelids are held open he does blink to threat bilaterally III,IV, VI: Difficult to assess as patient is keeping his eyes closed and resisting me opening his eyes.  I did note that his pupils are equal round reactive and approximately 2 mm.  He has no gaze deviation at this time.   V,VII: Face symmetric, no response to when asked if he can feel bilaterally and minimal response to noxious stimuli on both face and trapezius VIII: hearing normal bilaterally IX,X: Not able to visualize XI: bilateral shoulder shrug XII: Not able to visualize Motor:  Did not formally test his strength as he is in a four-point restraint and if was taken out of these I fear that he would try  to get out of bed and be aggressive. Tone and bulk:normal tone throughout; no atrophy noted Sensory: Winces to pain up in the internal thigh Deep Tendon Reflexes: 2+ and symmetric throughout upper extremities I could not elicit any deep tendon reflexes in the lower extremities Plantars: Right: downgoing   Left: downgoing Cerebellar: Not able to assess secondary to four-point restraint and severe agitation Gait: Not tested    Medications:  Scheduled: . folic acid  1 mg Oral Daily  . insulin aspart  0-9 Units Subcutaneous Q4H  . LORazepam  0-4 mg Intravenous Q6H   Followed by  . [START ON 01/24/2018] LORazepam  0-4 mg Intravenous Q12H  . multivitamin with minerals  1 tablet Oral Daily  . thiamine  100 mg Oral Daily   Or  . thiamine  100 mg Intravenous Daily   Continuous: . sodium chloride 75 mL/hr at 01/22/18 0534  . levETIRAcetam 1,000 mg (01/22/18 0544)   GUY:[id number] **OR** acetaminophen, hydrALAZINE, LORazepam **OR** LORazepam, ondansetron **OR** ondansetron (ZOFRAN) IV  Pertinent Labs/Diagnostics: Ammonia 41 B12 644 CBC 6.44 white blood cell count    Ct Head Wo Contrast  Result Date: 01/22/2018 CLINICAL DATA:  Status epilepticus recent hospitalization for subdural EXAM: CT HEAD WITHOUT CONTRAST TECHNIQUE: Contiguous axial images were obtained from the base of the skull through the vertex without intravenous contrast. COMPARISON:  01/20/2018, 01/11/2018, 01/01/2018 FINDINGS: Brain: Motion degraded study. This limits the examination.  Right holo hemispheric mixed density subdural hematoma, grossly similar in thickness, measuring 14 mm maximum on coronal images. Similar appearance of 6 mm midline shift to the left. Mass effect on the right lateral ventricle. Mild asymmetric enlargement of left lateral ventricle. Generalized sulcal effacement on the right consistent with edema. Basilar cisterns are patent. No mass is visualized. Vascular: No hyperdense vessels.  Carotid  vascular calcification Skull: No fracture Sinuses/Orbits: No acute finding. Other: None IMPRESSION: 1. Limited study secondary to motion degradation. 2. Known right complex holo hemispheric subdural hematoma on the right is grossly similar in size compared to the most recent CT, measuring up to 14 mm. Similar degree of 6 mm midline shift to the left with mass effect on the right lateral ventricle. Generalized sulcal effacement/edema within the right hemisphere as before. Electronically Signed   By: Donavan Foil M.D.   On: 01/22/2018 00:55   Ct Head Wo Contrast  Result Date: 01/20/2018 CLINICAL DATA:  62 year old male with altered mental status. EXAM: CT HEAD WITHOUT CONTRAST TECHNIQUE: Contiguous axial images were obtained from the base of the skull through the vertex without intravenous contrast. COMPARISON:  Several prior exams most recent 01/20/2018 10:46 a.m. FINDINGS: Brain: Large right-sided subdural hematoma with septation with maximal thickness of 15.2 mm with mass effect upon the right lateral ventricle with midline shift to the left by 6.6 mm similar to the recent exam. No new intracranial hemorrhage. No CT evidence of large acute infarct. No intracranial mass lesion noted on this unenhanced exam. Vascular: Vascular calcifications Skull: No acute abnormality Sinuses/Orbits: No acute orbital abnormality. Visualized paranasal sinuses are clear. Other: Mastoid air cells and middle ear cavities are clear. IMPRESSION: When compared to examination performed earlier today, there is a relatively stable appearance of large complex right-sided subdural hematoma which causes mass effect upon the right lateral ventricle and midline shift to the left as detailed above. No new intracranial hemorrhage or evidence of thrombotic infarct. Electronically Signed   By: Genia Del M.D.   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 Pocket of mildly increased thickness and mass effect along the right anterior frontal convexity. Midline shift has increased from 6 to 7 mm. Electronically Signed   By: Monte Fantasia M.D.   On: 01/20/2018 11:08      Etta Quill PA-C [facility] [phone]   Assessment: 62 year old male presenting with status epilepticus which had resolved.  He does have a right subdural hematoma which at this point time secondary to agitation and patient refusing for evacuation will be on hold.  Patient currently agitated but does not appear to be in status and on 1000 mg Keppra twice daily.  As noted in previous impression is unclear if he has been compliant with antiepileptics.  Impression: Status epilepticus secondary to right subdural hematoma and possible noncompliance of antiepileptics.   Recommendations: #Continue Keppra 1 g twice daily #EEG #Neurology will continue to follow     01/22/2018, 9:22 AM   NEUROHOSPITALIST ADDENDUM Seen and examined the patient today. I have reviewed the contents of history and physical exam as documented by PA/ARNP/Resident and agree with above documentation.   Please review my note later in the afternoon. Pt was having seizure on my exam and separate and recommendation mentioned on separate note.     Karena Addison Aroor MD [facility] [phone]   If 7pm to 7am,  please call on call as listed on [facility].

## 2018-01-22 NOTE — Progress Notes (Signed)
Pt highly agitated and combative, cursing and hitting staff members, follows no commands.Notified provider on call.

## 2018-01-22 NOTE — Consult Note (Addendum)
Neurology Consultation Reason for Consult: Status epilepticus Referring Physician: Tegeler, C  CC: Left-sided weakness  History is obtained from: Chart review  HPI: Juan Juarez is a 62 y.o. male with a history of recent hospitalizations due to a right-sided subdural hematoma with seizures who presents with ongoing seizure activity.  He apparently has been seizing for about 1.5 hours at the time of arrival.  My initial exam, he clearly had focal status epilepticus affecting his left side, but he was still following commands with the right. Following 2mg  ativan as well as 5mg  versed in the field plus Keppra 1 g his status epilepticus broke and he began improving.   ROS: Unable to obtain due to altered mental status.   Past Medical History:  Diagnosis Date  . Anxiety   . Depression   . Diabetes mellitus, type II ([place])   . Gout   . Hepatitis C without hepatic coma   . Hypertension   . Insomnia   . Non compliance w medication regimen   . Osteoarthritis   . Polysubstance abuse ([place])   . Protein-calorie malnutrition, severe ([place]) 09/13/2016  . Thrombocytopenia ([place])      Family History  Problem Relation Age of Onset  . Thyroid disease Sister   . Colon cancer Neg Hx   . Colon polyps Neg Hx      Social History:  reports that he has been smoking cigarettes.  He has a 25.00 pack-year smoking history. He has never used smokeless tobacco. He reports that he drinks alcohol. He reports that he has current or past drug history. Drugs: Marijuana and Cocaine.   Exam: Current vital signs: BP (!) 91/56   Pulse 84   Temp 98 F (36.7 C)   Resp 20   SpO2 100%  Vital signs in last 24 hours: Temp:  [98 F (36.7 C)] 98 F (36.7 C) (07/23 2056) Pulse Rate:  [67-84] 84 (07/23 2335) Resp:  [14-21] 20 (07/23 2335) BP: (80-139)/(56-83) 91/56 (07/23 2335) SpO2:  [98 %-100 %] 100 % (07/23 2335)   Physical Exam  Constitutional: Appears well-developed and well-nourished.  Psych: He is  not very talkative Eyes: No scleral injection HENT: No OP obstrucion Head: Normocephalic.  Cardiovascular: Normal rate and regular rhythm.  Respiratory: Effort normal, non-labored breathing GI: Soft.  No distension. There is no tenderness.  Skin: WDI  Neuro: Mental Status: Patient is awake, he is able to answer simple questions and make statements such as "I am hungry" Cranial Nerves: II: Visual Fields are full. Pupils are equal, round, and reactive to light.   III,IV, VI: Initially with leftward gaze deviation, subsequently VII: Left facial twitching followeed by improving weakness.  VIII: hearing is intact to voice Motor: Initially he has no movement on his left side, however following cessation of the seizure he gradually began following commands and moving it with at least 4/5 strength Sensory: Initially responds less on the left than the right Cerebellar: Does not perform  I have reviewed labs in epic and the results pertinent to this consultation are: UDS is positive for cocaine CMP-unremarkable  I have reviewed the images obtained: CT head from 7/22-right subdural hematoma  Impression: 62 year old male with status epilepticus that is now resolved secondary to right subdural hematoma. He apparently has been refusing evacuation repeatedly in the past. 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 I'm not certain of his compliance and be hesitant to start a second antiepileptic until he can ask  about it.  Recommendations: 1) Keppra 1 g twice a day  2) EEG in the a.m. 3) CT head 4) neurology will continue to follow  Roland Rack, MD [facility] [phone]  If 7pm- 7am, please page neurology on call as listed in [place].
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
## 2018-01-22 NOTE — Anesthesia Preprocedure Evaluation (Addendum)
Anesthesia Evaluation  Patient identified by MRN, date of birth, ID band Patient unresponsive    Reviewed: Patient's Chart, lab work & pertinent test results, Unable to perform ROS - Chart review only  Airway Mallampati: II  TM Distance: >3 FB     Dental   Pulmonary Current Smoker,     + decreased breath sounds      Cardiovascular hypertension,  Rhythm:Regular Rate:Normal     Neuro/Psych    GI/Hepatic   Endo/Other  diabetes  Renal/GU      Musculoskeletal   Abdominal   Peds  Hematology   Anesthesia Other Findings   Reproductive/Obstetrics                            Anesthesia Physical Anesthesia Plan  ASA: IV and emergent  Anesthesia Plan: General   Post-op Pain Management:    Induction: Intravenous  PONV Risk Score and Plan: Ondansetron  Airway Management Planned: Oral ETT  Additional Equipment:   Intra-op Plan:   Post-operative Plan: Possible Post-op intubation/ventilation  Informed Consent: I have reviewed the patients History and Physical, chart, labs and discussed the procedure including the risks, benefits and alternatives for the proposed anesthesia with the patient or authorized representative who has indicated his/her understanding and acceptance.     Plan Discussed with: CRNA and Anesthesiologist  Anesthesia Plan Comments:         Anesthesia Quick Evaluation
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
## 2018-01-22 NOTE — Progress Notes (Signed)
[facility]  Patient Status: Active (receiving services up to time of hospitalization)  Referred for services but readmitted to the Hospital prior to start of care.  [facility] is providing the following services: PT  If patient discharges after hours, please call [phone].   Janae Sauce 01/22/2018, 12:54 PM

## 2018-01-22 NOTE — Transfer of Care (Signed)
Immediate Anesthesia Transfer of Care Note  Patient: Juan Juarez  Procedure(s) Performed: RIGHT BURR HOLES (Right )  Patient Location: PACU  Anesthesia Type:General  Level of Consciousness: pateint uncooperative  Airway & Oxygen Therapy: Patient Spontanous Breathing and Patient connected to nasal cannula oxygen  Post-op Assessment: Report given to RN, Post -op Vital signs reviewed and stable and Patient moving all extremities  Post vital signs: Reviewed and stable  Last Vitals:  Vitals Value Taken Time  BP 102/86 01/22/2018  5:37 PM  Temp    Pulse 57 01/22/2018  5:41 PM  Resp 19 01/22/2018  5:41 PM  SpO2 93 % 01/22/2018  5:41 PM  Vitals shown include unvalidated device data.  Last Pain:  Vitals:   01/22/18 0740  TempSrc: Oral  PainSc:          Complications: No apparent anesthesia complications

## 2018-01-22 NOTE — Progress Notes (Signed)
Called to see if pt is available for his EEG. Nurse states the pt is on his way to surgery. Will attempt EEG on a different day when schedule permits and pt's condition as well.
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
## 2018-01-22 NOTE — Anesthesia Postprocedure Evaluation (Signed)
Anesthesia Post Note  Patient: Juan Juarez  Procedure(s) Performed: RIGHT BURR HOLES (Right )     Patient location during evaluation: PACU Anesthesia Type: General Level of consciousness: awake and alert Pain management: pain level controlled Vital Signs Assessment: post-procedure vital signs reviewed and stable Respiratory status: spontaneous breathing, nonlabored ventilation, respiratory function stable and patient connected to nasal cannula oxygen Cardiovascular status: blood pressure returned to baseline and stable Postop Assessment: no apparent nausea or vomiting Anesthetic complications: no    Last Vitals:  Vitals:   01/22/18 1509 01/22/18 1740  BP: (!) 106/55 102/86  Pulse: 69 66  Resp: 13 17  Temp:  (!) 36.3 C  SpO2: 100% 95%    Last Pain:  Vitals:   01/22/18 0740  TempSrc: Oral  PainSc:                  Juan Juarez
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
## 2018-01-22 NOTE — Progress Notes (Signed)
Subjective: The patient is sedated, agitated and restrained.  He will answer simple questions.  Objective: Vital signs in last 24 hours: Temp:  [97.8 F (36.6 C)-98 F (36.7 C)] 97.8 F (36.6 C) (07/24 0740) Pulse Rate:  [62-84] 68 (07/24 0740) Resp:  [14-23] 18 (07/24 0740) BP: (80-139)/(56-83) 121/69 (07/24 0740) SpO2:  [97 %-100 %] 100 % (07/24 0315) Weight:  [73.2 kg (161 lb 6 oz)] 73.2 kg (161 lb 6 oz) (07/24 0500) Estimated body mass index is 25.28 kg/m as calculated from the following:   Height as of 01/20/18: 5\' 7"  (1.702 m).   Weight as of this encounter: 73.2 kg (161 lb 6 oz).   Intake/Output from previous day: 07/23 0701 - 07/24 0700 In: 3000 [I.V.:2000; IV Piggyback:1000] Out: -  Intake/Output this shift: No intake/output data recorded.  Physical exam the patient is sedated but easily arousable.  He thinks he is at [facility].  His speech is a bit slurred.  He is moving all 4 extremities.  The patient's follow-up head CT demonstrates a right subdural hematoma with some mass-effect.  Lab Results: Recent Labs    01/21/18 2109 01/22/18 0455  WBC 5.6 6.4  HGB 12.7* 11.8*  HCT 38.0* 34.5*  PLT 102* 102*   BMET Recent Labs    01/21/18 2109 01/22/18 0455  NA 136 138  K 4.7 4.2  CL 105 110  CO2 26 22  GLUCOSE 181* 136*  BUN 15 15  CREATININE 0.86 0.79  CALCIUM 8.5* 8.1*    Studies/Results: Ct Head Wo Contrast  Result Date: 01/22/2018 CLINICAL DATA:  Status epilepticus recent hospitalization for subdural EXAM: CT HEAD WITHOUT CONTRAST TECHNIQUE: Contiguous axial images were obtained from the base of the skull through the vertex without intravenous contrast. COMPARISON:  01/20/2018, 01/11/2018, 01/01/2018 FINDINGS: Brain: Motion degraded study. This limits the examination. Right holo hemispheric mixed density subdural hematoma, grossly similar in thickness, measuring 14 mm maximum on coronal images. Similar appearance of 6 mm midline shift to the  left. Mass effect on the right lateral ventricle. Mild asymmetric enlargement of left lateral ventricle. Generalized sulcal effacement on the right consistent with edema. Basilar cisterns are patent. No mass is visualized. Vascular: No hyperdense vessels.  Carotid vascular calcification Skull: No fracture Sinuses/Orbits: No acute finding. Other: None IMPRESSION: 1. Limited study secondary to motion degradation. 2. Known right complex holo hemispheric subdural hematoma on the right is grossly similar in size compared to the most recent CT, measuring up to 14 mm. Similar degree of 6 mm midline shift to the left with mass effect on the right lateral ventricle. Generalized sulcal effacement/edema within the right hemisphere as before. Electronically Signed   By: Donavan Foil M.D.   On: 01/22/2018 00:55   Ct Head Wo Contrast  Result Date: 01/20/2018 CLINICAL DATA:  62 year old male with altered mental status. EXAM: CT HEAD WITHOUT CONTRAST TECHNIQUE: Contiguous axial images were obtained from the base of the skull through the vertex without intravenous contrast. COMPARISON:  Several prior exams most recent 01/20/2018 10:46 a.m. FINDINGS: Brain: Large right-sided subdural hematoma with septation with maximal thickness of 15.2 mm with mass effect upon the right lateral ventricle with midline shift to the left by 6.6 mm similar to the recent exam. No new intracranial hemorrhage. No CT evidence of large acute infarct. No intracranial mass lesion noted on this unenhanced exam. Vascular: Vascular calcifications Skull: No acute abnormality Sinuses/Orbits: No acute orbital abnormality. Visualized paranasal sinuses are clear. Other: Mastoid air cells  and middle ear cavities are clear. IMPRESSION: When compared to examination performed earlier today, there is a relatively stable appearance of large complex right-sided subdural hematoma which causes mass effect upon the right lateral ventricle and midline shift to the left as  detailed above. No new intracranial hemorrhage or evidence of thrombotic infarct. Electronically Signed   By: Genia Del M.D.   On: 01/20/2018 18:12   Ct Head Wo Contrast  Result Date: 01/20/2018 CLINICAL DATA:  Seizure, new, acute, history of trauma. EXAM: CT HEAD WITHOUT CONTRAST TECHNIQUE: Contiguous axial images were obtained from the base of the skull through the vertex without intravenous contrast. COMPARISON:  01/11/2018 FINDINGS: Brain: Known subdural collection along the right cerebral convexity with stable predominately low-density. There are stable high-density septations seen in the mid in dorsal portions. There has been increase thickness along the right anterior frontal convexity, see image 2:19, with greater mass effect at this level. The collection measures 15 mm in thickness today as compared to 13 mm previously. Midline shift measures 7 mm, previously 6 mm. No entrapment, infarct, or acute blood products. Vascular: No hyperdense vessel.  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 Pocket of mildly increased thickness and mass effect along the right anterior frontal convexity. Midline shift has increased from 6 to 7 mm. Electronically Signed   By: Monte Fantasia M.D.   On: 01/20/2018 11:08   Dg Chest Portable 1 View  Result Date: 01/21/2018 CLINICAL DATA:  Recent seizure activity EXAM: PORTABLE CHEST 1 VIEW COMPARISON:  01/20/2018 FINDINGS: The heart size and mediastinal contours are within normal limits. Both lungs are clear. The visualized skeletal structures are unremarkable. IMPRESSION: No active disease. Electronically Signed   By: Inez Catalina M.D.   On: 01/21/2018 21:25   Portable Chest 1 View  Result Date: 01/20/2018 CLINICAL DATA:  62 year old male. Somnolence. Subsequent encounter. EXAM: PORTABLE CHEST 1 VIEW COMPARISON:  01/11/2018 chest  x-ray. FINDINGS: No infiltrate, congestive heart failure or pneumothorax. No plain film evidence of pulmonary malignancy. Heart size within normal limits. Aortic calcifications. No acute osseous abnormality. IMPRESSION: No acute cardiopulmonary abnormality. Aortic Atherosclerosis (ICD10-I70.0). Electronically Signed   By: Genia Del M.D.   On: 01/20/2018 18:33    Assessment/Plan: Right subdural hematoma, seizures: It looks like the patient will need surgery.  He is not in a condition to get sent presently.  I will wait until he is more oriented or family members arrive to give consent.  I will possibly plan bur holes tomorrow.  LOS: 0 days     Ophelia Charter 01/22/2018, 8:20 AM

## 2018-01-22 NOTE — Progress Notes (Signed)
Patient had forced gaze deviation and was unresponsive when I walked into the room at 1.14 pm.  Stepped out to call the nurse to administer Ativan, when I came back to the room in 68min, patient was no longer seizing. Appeared confused and drowsy.   Will load with Fosphenytoin 20mg /kg. Start 100mg  TID for maintenance. Continue Keppra.  May consider LTM EEG, however will  hold as patient maybe going for decompression of SDH today.

## 2018-01-22 NOTE — Care Management Note (Signed)
Case Management Note  Patient Details  Name: Juan Juarez MRN: [id number] Date of Birth: [birth year]  Subjective/Objective:     Pt admitted with seizures. He is from home alone.                Action/Plan: Neurosurgery recommending [facility] for subdural hematoma. Awaiting family to arrive to hospital vs pt to become more oriented for consent. CM following for d/c needs post [employer].   Expected Discharge Date:                  Expected Discharge Plan:     In-House Referral:     Discharge planning Services     Post Acute Care Choice:    Choice offered to:     DME Arranged:    DME Agency:     HH Arranged:    HH Agency:     Status of Service:  In process, will continue to follow  If discussed at Long Length of Stay Meetings, dates discussed:    Additional Comments:  Pollie Friar, RN 01/22/2018, 11:33 AM

## 2018-01-22 NOTE — Anesthesia Procedure Notes (Signed)
Procedure Name: Intubation Date/Time: 01/22/2018 4:09 PM Performed by: Jamel Dunton T, CRNA Pre-anesthesia Checklist: Patient identified, Emergency Drugs available, Suction available and Patient being monitored Patient Re-evaluated:Patient Re-evaluated prior to induction Oxygen Delivery Method: Circle system utilized Preoxygenation: Pre-oxygenation with 100% oxygen Induction Type: IV induction Ventilation: Mask ventilation without difficulty Laryngoscope Size: Miller and 3 Grade View: Grade II Tube size: 7.5 mm Number of attempts: 1 Airway Equipment and Method: Patient positioned with wedge pillow and Stylet Placement Confirmation: ETT inserted through vocal cords under direct vision,  positive ETCO2 and breath sounds checked- equal and bilateral Secured at: 22 cm Tube secured with: Tape Dental Injury: Teeth and Oropharynx as per pre-operative assessment

## 2018-01-22 NOTE — ED Notes (Signed)
Nurse will collect etoh

## 2018-01-23 ENCOUNTER — Other Ambulatory Visit (HOSPITAL_COMMUNITY): Payer: Medicare Other

## 2018-01-23 ENCOUNTER — Encounter (HOSPITAL_COMMUNITY): Payer: Self-pay | Admitting: Neurosurgery

## 2018-01-23 ENCOUNTER — Inpatient Hospital Stay (HOSPITAL_COMMUNITY): Payer: Medicare Other

## 2018-01-23 LAB — BASIC METABOLIC PANEL
Anion gap: 9 (ref 5–15)
BUN: 7 mg/dL — ABNORMAL LOW (ref 8–23)
CALCIUM: 8.2 mg/dL — AB (ref 8.9–10.3)
CO2: 23 mmol/L (ref 22–32)
CREATININE: 0.84 mg/dL (ref 0.61–1.24)
Chloride: 105 mmol/L (ref 98–111)
Glucose, Bld: 109 mg/dL — ABNORMAL HIGH (ref 70–99)
Potassium: 4 mmol/L (ref 3.5–5.1)
Sodium: 137 mmol/L (ref 135–145)

## 2018-01-23 LAB — CBC
HEMATOCRIT: 35.5 % — AB (ref 39.0–52.0)
HEMOGLOBIN: 12.1 g/dL — AB (ref 13.0–17.0)
MCH: 33.2 pg (ref 26.0–34.0)
MCHC: 34.1 g/dL (ref 30.0–36.0)
MCV: 97.5 fL (ref 78.0–100.0)
PLATELETS: 103 10*3/uL — AB (ref 150–400)
RBC: 3.64 MIL/uL — ABNORMAL LOW (ref 4.22–5.81)
RDW: 14.1 % (ref 11.5–15.5)
WBC: 8.4 10*3/uL (ref 4.0–10.5)

## 2018-01-23 LAB — GLUCOSE, CAPILLARY
GLUCOSE-CAPILLARY: 110 mg/dL — AB (ref 70–99)
GLUCOSE-CAPILLARY: 132 mg/dL — AB (ref 70–99)
Glucose-Capillary: 143 mg/dL — ABNORMAL HIGH (ref 70–99)
Glucose-Capillary: 124 mg/dL — ABNORMAL HIGH (ref 70–99)
Glucose-Capillary: 120 mg/dL — ABNORMAL HIGH (ref 70–99)

## 2018-01-23 LAB — URINE CULTURE: CULTURE: NO GROWTH

## 2018-01-23 LAB — MAGNESIUM: Magnesium: 1.6 mg/dL — ABNORMAL LOW (ref 1.7–2.4)

## 2018-01-23 MED ORDER — LACTULOSE 10 GM/15ML PO SOLN: 30.0000 g | Freq: Three times a day (TID) | ORAL | Status: DC

## 2018-01-23 MED ORDER — LEVETIRACETAM IN NACL 1000 MG/100ML IV SOLN
1000.0000 mg | Freq: Two times a day (BID) | INTRAVENOUS | Status: DC
  Administered 2018-01-23 – 2018-02-02 (×22): 1000 mg via INTRAVENOUS
  Filled 2018-01-23 (×23): qty 100

## 2018-01-23 MED ORDER — MAGNESIUM SULFATE 2 GM/50ML IV SOLN
2.0000 g | Freq: Once | INTRAVENOUS | Status: AC
  Administered 2018-01-23: 2 g via INTRAVENOUS
  Filled 2018-01-23: qty 50

## 2018-01-23 MED ORDER — HALOPERIDOL LACTATE 5 MG/ML IJ SOLN
2.0000 mg | Freq: Once | INTRAMUSCULAR | Status: AC
  Administered 2018-01-24: 2 mg via INTRAVENOUS
  Filled 2018-01-23: qty 1

## 2018-01-23 MED FILL — Gelatin Absorbable Sponge Size 100: CUTANEOUS | Qty: 1 | Status: AC

## 2018-01-23 MED FILL — Thrombin (Recombinant) For Soln 20000 Unit: CUTANEOUS | Qty: 1 | Status: AC

## 2018-01-23 NOTE — Progress Notes (Signed)
Subjective: The patient is somnolent but arousable.  He is in no apparent distress.  By report he has not had any more seizures.  He has been sedated with Ativan and morphine for his CT scan.  Objective: Vital signs in last 24 hours: Temp:  [97.3 F (36.3 C)-98.3 F (36.8 C)] 98.3 F (36.8 C) (07/25 0800) Pulse Rate:  [53-99] 78 (07/25 0900) Resp:  [12-36] 14 (07/25 0900) BP: (102-159)/(55-108) 117/89 (07/25 0900) SpO2:  [92 %-100 %] 99 % (07/25 0900) Weight:  [73.2 kg (161 lb 6 oz)] 73.2 kg (161 lb 6 oz) (07/24 1502) Estimated body mass index is 25.28 kg/m as calculated from the following:   Height as of this encounter: 5\' 7"  (1.702 m).   Weight as of this encounter: 73.2 kg (161 lb 6 oz).   Intake/Output from previous day: 07/24 0701 - 07/25 0700 In: 2064.6 [I.V.:1364.5; IV Piggyback:700.1] Out: 4450 [Urine:4400; Blood:50] Intake/Output this shift: Total I/O In: 149.9 [I.V.:149.9] Out: -   Physical exam Glascow coma scale 9, E2M5V2.  The patient localizes bilaterally.  He moans to stimulation.  His dressings are clean and dry.  I have reviewed the patient's follow-up CT performed today.  It looks good with much less subdural hematoma and shift.  Lab Results: Recent Labs    01/22/18 0455 01/23/18 0345  WBC 6.4 8.4  HGB 11.8* 12.1*  HCT 34.5* 35.5*  PLT 102* 103*   BMET Recent Labs    01/22/18 0455 01/23/18 0345  NA 138 137  K 4.2 4.0  CL 110 105  CO2 22 23  GLUCOSE 136* 109*  BUN 15 7*  CREATININE 0.79 0.84  CALCIUM 8.1* 8.2*    Studies/Results: Ct Head Wo Contrast  Result Date: 01/23/2018 CLINICAL DATA:  Follow-up of intracranial hemorrhage EXAM: CT HEAD WITHOUT CONTRAST TECHNIQUE: Contiguous axial images were obtained from the base of the skull through the vertex without intravenous contrast. COMPARISON:  01/22/2018 head CT FINDINGS: Brain: Interval transcranial drainage of right convexity subdural hematoma, which has decreased in size with thickness  now measuring 3 mm, previously 10 mm. Leftward midline shift has decreased, now measuring 3 mm, previously 6 mm. There is no hydrocephalus. Mild mass effect on the right lateral ventricle has resolved. No new site of hemorrhage. Expected postoperative pneumocephalus without tension effects. Vascular: No abnormal hyperdensity of the major intracranial arteries or dural venous sinuses. No intracranial atherosclerosis. Skull: There are 2 right parietal burr holes. Sinuses/Orbits: No fluid levels or advanced mucosal thickening of the visualized paranasal sinuses. No mastoid or middle ear effusion. The orbits are normal. IMPRESSION: Transcranial evacuation of right convexity subdural hematoma, decreased in size, with improvement of midline shift. Electronically Signed   By: Ulyses Jarred M.D.   On: 01/23/2018 06:44   Ct Head Wo Contrast  Result Date: 01/22/2018 CLINICAL DATA:  Status epilepticus recent hospitalization for subdural EXAM: CT HEAD WITHOUT CONTRAST TECHNIQUE: Contiguous axial images were obtained from the base of the skull through the vertex without intravenous contrast. COMPARISON:  01/20/2018, 01/11/2018, 01/01/2018 FINDINGS: Brain: Motion degraded study. This limits the examination. Right holo hemispheric mixed density subdural hematoma, grossly similar in thickness, measuring 14 mm maximum on coronal images. Similar appearance of 6 mm midline shift to the left. Mass effect on the right lateral ventricle. Mild asymmetric enlargement of left lateral ventricle. Generalized sulcal effacement on the right consistent with edema. Basilar cisterns are patent. No mass is visualized. Vascular: No hyperdense vessels.  Carotid vascular calcification Skull: No fracture  Sinuses/Orbits: No acute finding. Other: None IMPRESSION: 1. Limited study secondary to motion degradation. 2. Known right complex holo hemispheric subdural hematoma on the right is grossly similar in size compared to the most recent CT, measuring  up to 14 mm. Similar degree of 6 mm midline shift to the left with mass effect on the right lateral ventricle. Generalized sulcal effacement/edema within the right hemisphere as before. Electronically Signed   By: Donavan Foil M.D.   On: 01/22/2018 00:55   Dg Chest Portable 1 View  Result Date: 01/21/2018 CLINICAL DATA:  Recent seizure activity EXAM: PORTABLE CHEST 1 VIEW COMPARISON:  01/20/2018 FINDINGS: The heart size and mediastinal contours are within normal limits. Both lungs are clear. The visualized skeletal structures are unremarkable. IMPRESSION: No active disease. Electronically Signed   By: Inez Catalina M.D.   On: 01/21/2018 21:25    Assessment/Plan: Postop day 1: Patient's follow-up scan looks good.  By report he has not had any more seizures.  I would expect him to look better clinically.  I suspect his decreased mental status may be largely secondary to metabolic issues.  I have discussed the case with Dr. Bonner Puna.  From my point of view he is okay to move out of the ICU.  LOS: 1 day     Ophelia Charter 01/23/2018, 9:35 AM
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
## 2018-01-23 NOTE — Progress Notes (Signed)
SLP Cancellation Note  Patient Details Name: Juan Juarez MRN: [id number] DOB: 1955-09-21   Cancelled treatment:       Reason Eval/Treat Not Completed: Fatigue/lethargy limiting ability to participate. Per RN, patient too lethargic for pos. Will f/u.   Gabriel Rainwater MA, CCC-SLP     Juan Juarez 01/23/2018, 11:51 AM

## 2018-01-23 NOTE — Progress Notes (Signed)
Reason for consult: Seizures  Subjective: Patient examined in the morning, confused and severely dysarthric. Moving all 4 extremities and no obvious neglect.   Re- examined in the evening, remains agitated. Not following commands However, he apparently followed commands to family and nurse at times.    ROS:  Unable to obtain due to poor mental status  Examination  Vital signs in last 24 hours: Temp:  [98.2 F (36.8 C)-99.8 F (37.7 C)] 99.8 F (37.7 C) (07/25 1600) Pulse Rate:  [53-99] 98 (07/25 1903) Resp:  [13-36] 21 (07/25 1903) BP: (105-159)/(60-108) 139/77 (07/25 1903) SpO2:  [92 %-100 %] 99 % (07/25 1903)  General: Appears agitated,  CVS: pulse-normal rate and rhythm RS: breathing comfortably Extremities: normal   Neuro: MS: agitated, not following commands. Trying to get out of the bed.  CN: pupils equal and reactive,  EOM: tracks bilaterally,  face symmetric, tongue midline,  Motor: good  strength in all 4 extremities Coordination: no obvious ataxia Gait: not tested  Basic Metabolic Panel: Recent Labs  Lab 01/20/18 1020 01/20/18 1055 01/20/18 1620 01/21/18 10/09/2105 01/21/18 10-10-2107 01/22/18 0455 01/23/18 0345  NA 134* 136  --   --  136 138 137  K 4.0 4.0  --   --  4.7 4.2 4.0  CL 103 102  --   --  105 110 105  CO2 25  --   --   --  26 22 23   GLUCOSE 174* 174*  --   --  181* 136* 109*  BUN 14 12  --   --  15 15 7*  CREATININE 0.72 0.70  --   --  0.86 0.79 0.84  CALCIUM 8.8*  --   --   --  8.5* 8.1* 8.2*  MG 1.5*  --  1.4* 1.5*  --  1.4* 1.6*    CBC: Recent Labs  Lab 01/20/18 1020 01/20/18 1055 01/21/18 10-Oct-2107 01/22/18 0455 01/23/18 0345  WBC 6.8  --  5.6 6.4 8.4  NEUTROABS 3.7  --  3.1 3.2  --   HGB 13.6 12.6* 12.7* 11.8* 12.1*  HCT 38.6* 37.0* 38.0* 34.5* 35.5*  MCV 97.2  --  100.5* 98.9 97.5  PLT 120*  --  102* 102* 103*     Coagulation Studies: Recent Labs    01/21/18 Oct 10, 2107  LABPROT 14.7  INR 1.15    Imaging Reviewed:     ASSESSMENT  AND PLAN  89 Y Male with large right SDH with midline shift presents with status epilepticus. He also has history of alcohol abuse, cocaine abuse.  Seizures resolved after keppra, however later in the day noted by me to have partial seizure when rounding. Loaded with Dilantin and was taken to the OR.  Following surgical decompression, patient has been severely dysarthric, following commands intermittently and agitated.   Impression  Status epilepticus, likely resolved Alcohol withdrawal  Right SDH with midline shift s/p craniotomy  Recommendations  Sedation as needed, consider scheduled benzos MRI brain to rule out stroke due to dysarthria EEG once surgical dressing removed, will try tomorrow Seizure precuations Continue Keppra  And Dilantin, check Phenytoin level tomorrow     This patient is neurologically critically ill due to recurrent seizures,  Subdural hemorrhage s/p craniotomy.  He is at risk for significant risk of neurological worsening from  Worsening seizures, infection, respiratory failure, stroke. This patient's care requires constant monitoring of vital signs, hemodynamics, respiratory and cardiac monitoring, review of multiple databases, neurological assessment, discussion with family, other specialists  and medical decision making of high complexity.  I spent 45  minutes of neurocritical time in the care of this patient.     Karena Addison Gyasi Hazzard [facility] Pager Number [phone] For questions after 7pm please refer to AMION to reach the Neurologist on call
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
## 2018-01-24 ENCOUNTER — Inpatient Hospital Stay (HOSPITAL_COMMUNITY): Payer: Medicare Other

## 2018-01-24 LAB — COMPREHENSIVE METABOLIC PANEL
ALT: 33 U/L (ref 0–44)
AST: 52 U/L — AB (ref 15–41)
Albumin: 2.6 g/dL — ABNORMAL LOW (ref 3.5–5.0)
Alkaline Phosphatase: 101 U/L (ref 38–126)
Anion gap: 9 (ref 5–15)
BUN: 9 mg/dL (ref 8–23)
CO2: 24 mmol/L (ref 22–32)
Calcium: 8.3 mg/dL — ABNORMAL LOW (ref 8.9–10.3)
Chloride: 102 mmol/L (ref 98–111)
Creatinine, Ser: 0.95 mg/dL (ref 0.61–1.24)
Glucose, Bld: 141 mg/dL — ABNORMAL HIGH (ref 70–99)
POTASSIUM: 4.2 mmol/L (ref 3.5–5.1)
SODIUM: 135 mmol/L (ref 135–145)
Total Bilirubin: 1.9 mg/dL — ABNORMAL HIGH (ref 0.3–1.2)
Total Protein: 7.3 g/dL (ref 6.5–8.1)

## 2018-01-24 LAB — CBC
HEMATOCRIT: 36.1 % — AB (ref 39.0–52.0)
Hemoglobin: 12.4 g/dL — ABNORMAL LOW (ref 13.0–17.0)
MCH: 33.7 pg (ref 26.0–34.0)
MCHC: 34.3 g/dL (ref 30.0–36.0)
MCV: 98.1 fL (ref 78.0–100.0)
Platelets: 97 10*3/uL — ABNORMAL LOW (ref 150–400)
RBC: 3.68 MIL/uL — AB (ref 4.22–5.81)
RDW: 13.8 % (ref 11.5–15.5)
WBC: 9.5 10*3/uL (ref 4.0–10.5)

## 2018-01-24 LAB — GLUCOSE, CAPILLARY
GLUCOSE-CAPILLARY: 127 mg/dL — AB (ref 70–99)
GLUCOSE-CAPILLARY: 132 mg/dL — AB (ref 70–99)
GLUCOSE-CAPILLARY: 142 mg/dL — AB (ref 70–99)
GLUCOSE-CAPILLARY: 90 mg/dL (ref 70–99)
Glucose-Capillary: 103 mg/dL — ABNORMAL HIGH (ref 70–99)
Glucose-Capillary: 124 mg/dL — ABNORMAL HIGH (ref 70–99)
Glucose-Capillary: 81 mg/dL (ref 70–99)

## 2018-01-24 LAB — MAGNESIUM: MAGNESIUM: 1.6 mg/dL — AB (ref 1.7–2.4)

## 2018-01-24 MED ORDER — MAGNESIUM SULFATE 2 GM/50ML IV SOLN
2.0000 g | Freq: Once | INTRAVENOUS | Status: AC
  Administered 2018-01-24: 2 g via INTRAVENOUS
  Filled 2018-01-24: qty 50

## 2018-01-24 MED ORDER — LACOSAMIDE 200 MG/20ML IV SOLN
200.0000 mg | Freq: Once | INTRAVENOUS | Status: AC
  Administered 2018-01-24: 200 mg via INTRAVENOUS
  Filled 2018-01-24: qty 20

## 2018-01-24 MED ORDER — SODIUM CHLORIDE 0.9 % IV SOLN
INTRAVENOUS | Status: DC
  Administered 2018-01-24 – 2018-01-30 (×4): via INTRAVENOUS

## 2018-01-24 NOTE — Progress Notes (Signed)
PT Cancellation Note  Patient Details Name: Juan Juarez MRN: [id number] DOB: 01-09-1956   Cancelled Treatment:    Reason Eval/Treat Not Completed: Medical issues which prohibited therapy.  Per RN increased restlessness and seizure activity seen on EEG.  PT to hold today.   Thanks,    Barbarann Ehlers. [place], [place], DPT [phone]   01/24/2018, 3:09 PM

## 2018-01-24 NOTE — Progress Notes (Signed)
SLP Cancellation Note  Patient Details Name: Juan Juarez MRN: [id number] DOB: 29-Sep-1955   Cancelled treatment:       Reason Eval/Treat Not Completed: Fatigue/lethargy limiting ability to participate  Deneise Lever, [place], [place] Speech-Language Pathologist [phone]  Aliene Altes 01/24/2018, 9:11 AM

## 2018-01-24 NOTE — Procedures (Signed)
ELECTROENCEPHALOGRAM REPORT   Patient: Juan Juarez       Room #: 4N20C EEG No. ID: [id number] Age: 62 y.o.        Sex: male Referring Physician: Aroor Report Date:  01/24/2018        Interpreting Physician: Alexis Goodell  History: Juan Juarez is an 62 y.o. male with history of seizures and recent SDH now presenting with altered mental status  Medications:  Pepcid, MVI, B1, Dilantin, Keppra, Lactulose, Folvite, Insulin  Conditions of Recording:  This is a 21 channel routine scalp EEG performed with bipolar and monopolar montages arranged in accordance to the international 10/20 system of electrode placement. One channel was dedicated to EKG recording.  The patient is in the awake and drowsy states.  Description:  The background activity is asymmetric.  For the entire recording the background activity over the right hemisphere is markedly attenuated.  There are intermittent sharp transients that are of highest voltage in the right occipital area.  They occur at a rate of 1/second noted.  Although these are sustained over the right hemisphere at times they are noted synchronously over the left hemisphere as well but of lower voltage and less well sustained.  When the patient is more alert or talking the background activity over the left hemisphere is no longer noted to have the sharp transients but remains low voltage and with a superimposed beta activity.  Hyperventilation and intermittent photic stimulation were not performed.   IMPRESSION: This is an abnormal electroencephalogram secondary to right hemispheric PLEDS activity that at times generalizes to GPEDS activity.     Alexis Goodell, MD Neurology [phone] 01/24/2018, 6:03 PM

## 2018-01-24 NOTE — Progress Notes (Signed)
Subjective: The patient is slightly more alert today.  He is otherwise without change.  He is restrained and agitated.  Objective: Vital signs in last 24 hours: Temp:  [98 F (36.7 C)-99.8 F (37.7 C)] 98 F (36.7 C) (07/26 0400) Pulse Rate:  [72-98] 86 (07/26 0600) Resp:  [13-28] 20 (07/26 0600) BP: (103-160)/(60-97) 103/79 (07/26 0600) SpO2:  [95 %-100 %] 100 % (07/26 0600) Estimated body mass index is 25.28 kg/m as calculated from the following:   Height as of this encounter: 5\' 7"  (1.702 m).   Weight as of this encounter: 73.2 kg (161 lb 6 oz).   Intake/Output from previous day: 07/25 0701 - 07/26 0700 In: 1888.4 [I.V.:1669.2; IV Piggyback:219.2] Out: 8185 [Urine:4050] Intake/Output this shift: Total I/O In: 875 [I.V.:825; IV Piggyback:50] Out: 950 [Urine:950]  Physical exam Glasgow Coma Scale 10, E2M5V3.  He mumbles occasionally is somewhat coherent.  Moves all 4 extremities well.  His dressings are dry.  Lab Results: Recent Labs    01/23/18 0345 01/24/18 0325  WBC 8.4 9.5  HGB 12.1* 12.4*  HCT 35.5* 36.1*  PLT 103* 97*   BMET Recent Labs    01/23/18 0345 01/24/18 0325  NA 137 135  K 4.0 4.2  CL 105 102  CO2 23 24  GLUCOSE 109* 141*  BUN 7* 9  CREATININE 0.84 0.95  CALCIUM 8.2* 8.3*    Studies/Results: Ct Head Wo Contrast  Result Date: 01/24/2018 CLINICAL DATA:  Intracranial hemorrhage follow up EXAM: CT HEAD WITHOUT CONTRAST TECHNIQUE: Contiguous axial images were obtained from the base of the skull through the vertex without intravenous contrast. COMPARISON:  01/23/2018 FINDINGS: Brain: Unchanged size of right convexity subdural hematoma, status post transcranial evacuation. 3 mm leftward midline shift is unchanged. The size and configuration of the ventricles are unchanged. No new site of hemorrhage. Mild persistent pneumocephalus. Vascular: No hyperdense vessel or unexpected calcification. Skull: 2 parietal burr holes on the right. Overlying soft  tissue swelling. Sinuses/Orbits: Paranasal sinuses and mastoids are clear. Normal orbits. Other: None. IMPRESSION: Unchanged right convexity subdural hematoma, status post evacuation, with unchanged 3 mm leftward midline shift. Electronically Signed   By: Ulyses Jarred M.D.   On: 01/24/2018 05:07   Ct Head Wo Contrast  Result Date: 01/23/2018 CLINICAL DATA:  Follow-up of intracranial hemorrhage EXAM: CT HEAD WITHOUT CONTRAST TECHNIQUE: Contiguous axial images were obtained from the base of the skull through the vertex without intravenous contrast. COMPARISON:  01/22/2018 head CT FINDINGS: Brain: Interval transcranial drainage of right convexity subdural hematoma, which has decreased in size with thickness now measuring 3 mm, previously 10 mm. Leftward midline shift has decreased, now measuring 3 mm, previously 6 mm. There is no hydrocephalus. Mild mass effect on the right lateral ventricle has resolved. No new site of hemorrhage. Expected postoperative pneumocephalus without tension effects. Vascular: No abnormal hyperdensity of the major intracranial arteries or dural venous sinuses. No intracranial atherosclerosis. Skull: There are 2 right parietal burr holes. Sinuses/Orbits: No fluid levels or advanced mucosal thickening of the visualized paranasal sinuses. No mastoid or middle ear effusion. The orbits are normal. IMPRESSION: Transcranial evacuation of right convexity subdural hematoma, decreased in size, with improvement of midline shift. Electronically Signed   By: Ulyses Jarred M.D.   On: 01/23/2018 06:44    Assessment/Plan: Stop day #2: I suspect were dealing with a combination of call and drug withdrawal, metabolic encephalopathy, status post bur holes for subdural hematoma.  We will continue with supportive care.  LOS: 2  days     Ophelia Charter 01/24/2018, 6:54 AM

## 2018-01-24 NOTE — Progress Notes (Signed)
EEG routine complete, results pending. Per neuro findings, LTM order is in place for continuous monitoring.
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
## 2018-01-24 NOTE — Progress Notes (Signed)
vLTM EEG running. Pt is very agitated during hookup. Notified neuro.

## 2018-01-24 NOTE — Progress Notes (Signed)
OT Cancellation Note  Patient Details Name: Juan Juarez MRN: [id number] DOB: 1955-11-04   Cancelled Treatment:    Reason Eval/Treat Not Completed: Medical issues which prohibited therapy. Pt with seizure activity currently, very restless, OT will continue to follow and eval when medically appropriate.   [place] 01/24/2018, 3:07 PM  Hulda Humphrey OTR/L [phone]

## 2018-01-24 NOTE — Progress Notes (Signed)
Neurology MD aware that we were unable to obtain MRI tonight due to agitation. Patient was given haldol with no improvement. Head CT ordered by Dr. Leonel Ramsay. Will continue to monitor.

## 2018-01-24 NOTE — Progress Notes (Addendum)
Reason for consult:   Subjective: Very agitated, not following commands and dysarthric and with constant moaning and try to get up out of bed.  Dressing was removed and patient was connected to EEG.  ROS:  Unable to obtain due to poor mental status  Examination  Vital signs in last 24 hours: Temp:  [97.7 F (36.5 C)-98.3 F (36.8 C)] 98.1 F (36.7 C) (07/26 2000) Pulse Rate:  [66-93] 67 (07/26 1900) Resp:  [12-27] 19 (07/26 1900) BP: (103-160)/(60-107) 114/74 (07/26 1900) SpO2:  [96 %-100 %] 100 % (07/26 1900)  General: Not in distress, cooperative CVS: pulse-normal rate and rhythm RS: breathing comfortably Extremities: normal   Neuro: Mental status: Patient is agitated, not following any commands. Cranial nerves: Pupils are equal and reactive, tracks bilaterally, left facial droop Motor: Moves all 4 extremities with good strength   Basic Metabolic Panel: Recent Labs  Lab 01/20/18 1020 01/20/18 1055 01/20/18 1620 01/21/18 2107 01/21/18 2109 01/22/18 0455 01/23/18 0345 01/24/18 0325  NA 134* 136  --   --  136 138 137 135  K 4.0 4.0  --   --  4.7 4.2 4.0 4.2  CL 103 102  --   --  105 110 105 102  CO2 25  --   --   --  26 22 23 24   GLUCOSE 174* 174*  --   --  181* 136* 109* 141*  BUN 14 12  --   --  15 15 7* 9  CREATININE 0.72 0.70  --   --  0.86 0.79 0.84 0.95  CALCIUM 8.8*  --   --   --  8.5* 8.1* 8.2* 8.3*  MG 1.5*  --  1.4* 1.5*  --  1.4* 1.6* 1.6*    CBC: Recent Labs  Lab 01/20/18 1020 01/20/18 1055 01/21/18 2109 01/22/18 0455 01/23/18 0345 01/24/18 0325  WBC 6.8  --  5.6 6.4 8.4 9.5  NEUTROABS 3.7  --  3.1 3.2  --   --   HGB 13.6 12.6* 12.7* 11.8* 12.1* 12.4*  HCT 38.6* 37.0* 38.0* 34.5* 35.5* 36.1*  MCV 97.2  --  100.5* 98.9 97.5 98.1  PLT 120*  --  102* 102* 103* 97*     Coagulation Studies: No results for input(s): LABPROT, INR in the last 72 hours.  Imaging Reviewed:   EEG was reviewed: This is an abnormal electroencephalogram  secondary to right hemispheric PLEDS activity that at times generalizes to GPEDS activity.      ASSESSMENT AND PLAN  82 Y Male with large right SDH with midline shift presents with status epilepticus. He also has history of alcohol abuse, cocaine abuse.  Seizures resolved after keppra, however later in the day noted by me to have partial seizure when rounding. Loaded with Dilantin and was taken to the OR.  Following surgical decompression, patient has been severely dysarthric, following commands intermittently and agitated   EEG concerning for frequent PLD and poor exam.  Will load with Vimpat in addition to Keppra and Dilantin. Will connect to LTM EEG   Status epilepticus, resolved-however EEG showing frequent PLDs Alcohol withdrawal Right subdural hemorrhage with midline shift status post craniotomy   PLDs maybe reflective of encephalopathy, injury in the setting of subdural hemorrhage and frequent seizures.  They are PLDS proper pattern which is less ictal in intal-interictal spectrum and therefore will not treat aggressively.  I loaded the patient with Vimpat which did not have an effect on the EEG.  Plan is to  continue Keppra and Dilantin.  I will not suppress this patient unless he starts having clinical events concerning for seizures.    Karena Addison Aroor [facility] Pager Number [phone] For questions after 7pm please refer to AMION to reach the Neurologist on call    This patient is neurologically critically ill due to recurrent seizures,  Subdural hemorrhage s/p craniotomy.  He is at risk for significant risk of neurological worsening from  Worsening seizures, infection, respiratory failure, stroke. This patient's care requires constant monitoring of vital signs, hemodynamics, respiratory and cardiac monitoring, review of multiple databases, neurological assessment, discussion with family, other specialists and medical decision making of high complexity.  I  spent40 minutes of neurocritical time in the care of this patient.

## 2018-01-25 ENCOUNTER — Inpatient Hospital Stay (HOSPITAL_COMMUNITY): Payer: Medicare Other

## 2018-01-25 DIAGNOSIS — E43 Unspecified severe protein-calorie malnutrition: Secondary | ICD-10-CM

## 2018-01-25 LAB — GLUCOSE, CAPILLARY
GLUCOSE-CAPILLARY: 103 mg/dL — AB (ref 70–99)
Glucose-Capillary: 97 mg/dL (ref 70–99)
Glucose-Capillary: 100 mg/dL — ABNORMAL HIGH (ref 70–99)
Glucose-Capillary: 114 mg/dL — ABNORMAL HIGH (ref 70–99)
Glucose-Capillary: 110 mg/dL — ABNORMAL HIGH (ref 70–99)
Glucose-Capillary: 145 mg/dL — ABNORMAL HIGH (ref 70–99)

## 2018-01-25 LAB — RENAL FUNCTION PANEL
ANION GAP: 12 (ref 5–15)
Albumin: 2.5 g/dL — ABNORMAL LOW (ref 3.5–5.0)
BUN: 10 mg/dL (ref 8–23)
CO2: 23 mmol/L (ref 22–32)
Calcium: 8.2 mg/dL — ABNORMAL LOW (ref 8.9–10.3)
Chloride: 99 mmol/L (ref 98–111)
Creatinine, Ser: 0.86 mg/dL (ref 0.61–1.24)
GFR calc Af Amer: >60 mL/min (ref >60)
GFR calc non Af Amer: >60 mL/min (ref >60)
GLUCOSE: 105 mg/dL — AB (ref 70–99)
POTASSIUM: 4.5 mmol/L (ref 3.5–5.1)
Phosphorus: 3.3 mg/dL (ref 2.5–4.6)
Sodium: 134 mmol/L — ABNORMAL LOW (ref 135–145)

## 2018-01-25 LAB — PHENYTOIN LEVEL, TOTAL: Phenytoin Lvl: 36 ug/mL (ref 10.0–20.0)

## 2018-01-25 LAB — RPR: RPR: NONREACTIVE

## 2018-01-25 LAB — TSH: TSH: 0.941 u[IU]/mL (ref 0.350–4.500)

## 2018-01-25 LAB — MAGNESIUM: Magnesium: 1.5 mg/dL — ABNORMAL LOW (ref 1.7–2.4)

## 2018-01-25 MED ORDER — LORAZEPAM 1 MG PO TABS: 1.0000 mg | ORAL_TABLET | Freq: Four times a day (QID) | ORAL | Status: DC | PRN

## 2018-01-25 MED ORDER — JEVITY 1.2 CAL PO LIQD
1000.0000 mL | ORAL | Status: DC
  Administered 2018-01-25 – 2018-01-29 (×6): 1000 mL
  Filled 2018-01-25 (×11): qty 1000

## 2018-01-25 MED ORDER — DEXMEDETOMIDINE HCL IN NACL 400 MCG/100ML IV SOLN
0.4000 ug/kg/h | INTRAVENOUS | Status: DC
  Administered 2018-01-25 (×3): 0.4 ug/kg/h via INTRAVENOUS
  Administered 2018-01-26: 0.6 ug/kg/h via INTRAVENOUS
  Administered 2018-01-26: 0.5 ug/kg/h via INTRAVENOUS
  Administered 2018-01-27: 0.8 ug/kg/h via INTRAVENOUS
  Administered 2018-01-27: 0.7 ug/kg/h via INTRAVENOUS
  Administered 2018-01-27: 0.9 ug/kg/h via INTRAVENOUS
  Administered 2018-01-28: 1.2 ug/kg/h via INTRAVENOUS
  Administered 2018-01-28: 0.8 ug/kg/h via INTRAVENOUS
  Administered 2018-01-29 (×2): 1.2 ug/kg/h via INTRAVENOUS
  Administered 2018-01-29: 1 ug/kg/h via INTRAVENOUS
  Administered 2018-01-29 – 2018-01-30 (×3): 1.2 ug/kg/h via INTRAVENOUS
  Administered 2018-01-30: 0.8 ug/kg/h via INTRAVENOUS
  Administered 2018-01-30: 0.5 ug/kg/h via INTRAVENOUS
  Administered 2018-01-30: 1.2 ug/kg/h via INTRAVENOUS
  Filled 2018-01-25 (×19): qty 100

## 2018-01-25 MED ORDER — LACTULOSE 10 GM/15ML PO SOLN
10.0000 g | Freq: Three times a day (TID) | ORAL | Status: DC
  Administered 2018-01-25 – 2018-01-28 (×11): 10 g
  Filled 2018-01-25 (×11): qty 15

## 2018-01-25 MED ORDER — FOLIC ACID 1 MG PO TABS
1.0000 mg | ORAL_TABLET | Freq: Every day | ORAL | Status: DC
  Administered 2018-01-26 – 2018-02-02 (×8): 1 mg
  Filled 2018-01-25 (×8): qty 1

## 2018-01-25 MED ORDER — THIAMINE HCL 100 MG/ML IJ SOLN
100.0000 mg | Freq: Every day | INTRAMUSCULAR | Status: DC
  Administered 2018-02-01: 100 mg via INTRAVENOUS
  Filled 2018-01-25 (×2): qty 2

## 2018-01-25 MED ORDER — SODIUM CHLORIDE 0.9 % IV BOLUS
1000.0000 mL | Freq: Once | INTRAVENOUS | Status: AC
  Administered 2018-01-25: 1000 mL via INTRAVENOUS

## 2018-01-25 MED ORDER — PRO-STAT SUGAR FREE PO LIQD
30.0000 mL | Freq: Every day | ORAL | Status: DC
  Administered 2018-01-25 – 2018-01-30 (×6): 30 mL
  Filled 2018-01-25 (×6): qty 30

## 2018-01-25 MED ORDER — LORAZEPAM 2 MG/ML IJ SOLN
2.0000 mg | Freq: Once | INTRAMUSCULAR | Status: AC
  Administered 2018-01-25: 2 mg via INTRAVENOUS

## 2018-01-25 MED ORDER — LORAZEPAM 2 MG/ML IJ SOLN
1.0000 mg | Freq: Four times a day (QID) | INTRAMUSCULAR | Status: DC | PRN
  Filled 2018-01-25: qty 1

## 2018-01-25 MED ORDER — VITAMIN B-1 100 MG PO TABS
100.0000 mg | ORAL_TABLET | Freq: Every day | ORAL | Status: DC
  Administered 2018-01-26 – 2018-02-02 (×7): 100 mg
  Filled 2018-01-25 (×8): qty 1

## 2018-01-25 MED ORDER — MAGNESIUM SULFATE 2 GM/50ML IV SOLN
2.0000 g | Freq: Two times a day (BID) | INTRAVENOUS | Status: AC
  Administered 2018-01-25 – 2018-01-26 (×3): 2 g via INTRAVENOUS
  Filled 2018-01-25 (×3): qty 50

## 2018-01-25 MED ORDER — LORAZEPAM 1 MG PO TABS: 1.0000 mg | ORAL_TABLET | Freq: Four times a day (QID) | ORAL | Status: AC | PRN

## 2018-01-25 MED ORDER — LORAZEPAM 2 MG/ML IJ SOLN
1.0000 mg | Freq: Four times a day (QID) | INTRAMUSCULAR | Status: AC | PRN
  Administered 2018-01-26 (×3): 1 mg via INTRAVENOUS
  Filled 2018-01-25 (×3): qty 1

## 2018-01-25 MED ORDER — ADULT MULTIVITAMIN W/MINERALS CH
1.0000 | ORAL_TABLET | Freq: Every day | ORAL | Status: DC
  Administered 2018-01-26 – 2018-02-02 (×8): 1
  Filled 2018-01-25 (×8): qty 1

## 2018-01-25 NOTE — Procedures (Signed)
Electroencephalography report.  Long-term monitoring  Number of study :day 1  Recording begins 01/24/2018 at 13 23 Recording and 01/25/2018 at 8:03 AM  Data acquisition: International 10-20 for eligible placement.  18 channels EEG with additional EKG channel  Background activities marked by continuous background activity slowing.  Superimposed right hemispheric lateralized periodic epileptiform discharges present in a continuous fashion alternating with very frequent electrographic seizures across right hemisphere.  Maximum negativity of right periodic lateralized epileptiform discharges and electrographic seizures in the right central parietal cortex.  Negative field extend to midline and at times to the paracentral left hemispheric cortex.  As recording progressed there is a resolution of subclinical electrographic seizures with last brief seizure around 2300.  During second half of the recording improvement noted with less prominent right hemispheric periodic lateralized epileptiform discharges and background activities away activity.  No seizures were present during second half of the recording.  Clinical interpretation: This day 1 of intensive EEG monitoring with simultaneous video monitoring recorded multiple brief electrographic seizures across right hemisphere particularly maximum negativity right central parietal cortex and right hemispheric periodic lateralized epileptiform discharges -all suggestive of neuronal dysfunction and cortical irritability in that region.  There is improvement during second half of the recording as discussed above with resolution of electrographic seizures and improvement in right hemispheric PLEDs aperients.  Clinical correlation is advised.

## 2018-01-25 NOTE — Progress Notes (Signed)
eLink Physician-Brief Progress Note Patient Name: Kostantinos Tallman DOB: 02-25-1956 MRN: [id number]   Date of Service  01/25/2018  HPI/Events of Note  Hypotension - BP = 83/56 with MAP = 66. Precedex IV infusion now on hold.   eICU Interventions  Will order: 1. 0.9 NaCl 1 liter IV over 1 hour now.      Intervention Category Intermediate Interventions: Hypotension - evaluation and management  Reyes Aldaco Eugene 01/25/2018, 8:16 PM

## 2018-01-25 NOTE — Progress Notes (Signed)
PT Cancellation Note  Patient Details Name: Juan Juarez MRN: [id number] DOB: 20-Jan-1956   Cancelled Treatment:    Reason Eval/Treat Not Completed: Patient not medically ready.  Pt agitated, restless and unable to participate with therapies today per RN. 01/25/2018  Donnella Sham, PT [phone]  (pager)   Juan Juarez 01/25/2018, 12:46 PM

## 2018-01-25 NOTE — Progress Notes (Signed)
LTM EEG checked, no skin breakdown noted.  

## 2018-01-25 NOTE — Progress Notes (Signed)
Reason for consult:  Non convulsive status epilepticus  Subjective: Patient is drowsy this morning.     ROS: negative except above   Examination  Vital signs in last 24 hours: Temp:  [98 F (36.7 C)-98.6 F (37 C)] 98 F (36.7 C) (07/27 0808) Pulse Rate:  [66-84] 79 (07/27 0700) Resp:  [10-25] 10 (07/27 0700) BP: (107-145)/(67-107) 145/81 (07/27 0700) SpO2:  [94 %-100 %] 100 % (07/27 0700) Weight:  [67.2 kg (148 lb 2.4 oz)] 67.2 kg (148 lb 2.4 oz) (07/27 0500)  General: Not in distress, cooperative CVS: pulse-normal rate and rhythm RS: breathing comfortably Extremities: normal   Neuro: MS: Drowsy, arousable Cranial nerves: Pupils are equal and reactive Motor: Withdraws in all 4 extremities equally  Basic Metabolic Panel: Recent Labs  Lab 01/21/18 2107 01/21/18 2109 01/22/18 0455 01/23/18 0345 01/24/18 0325 01/25/18 0155  NA  --  136 138 137 135 134*  K  --  4.7 4.2 4.0 4.2 4.5  CL  --  105 110 105 102 99  CO2  --  26 22 23 24 23   GLUCOSE  --  181* 136* 109* 141* 105*  BUN  --  15 15 7* 9 10  CREATININE  --  0.86 0.79 0.84 0.95 0.86  CALCIUM  --  8.5* 8.1* 8.2* 8.3* 8.2*  MG 1.5*  --  1.4* 1.6* 1.6* 1.5*  PHOS  --   --   --   --   --  3.3    CBC: Recent Labs  Lab 01/20/18 1020 01/20/18 1055 01/21/18 2109 01/22/18 0455 01/23/18 0345 01/24/18 0325  WBC 6.8  --  5.6 6.4 8.4 9.5  NEUTROABS 3.7  --  3.1 3.2  --   --   HGB 13.6 12.6* 12.7* 11.8* 12.1* 12.4*  HCT 38.6* 37.0* 38.0* 34.5* 35.5* 36.1*  MCV 97.2  --  100.5* 98.9 97.5 98.1  PLT 120*  --  102* 102* 103* 97*     Coagulation Studies: No results for input(s): LABPROT, INR in the last 72 hours.  Imaging Reviewed:     ASSESSMENT AND PLAN   47 Y Male with large right SDH with midline shift presents with status epilepticus. He also has history of alcohol abuse, cocaine abuse.  Seizures resolved after keppra, however later in the day noted by me to have partial seizure when rounding. Loaded  with Dilantin and was taken to the OR.  Following surgical decompression, patient has been severely dysarthric, following commands intermittently and agitated   EEG concerning for frequent PLD and poor exam.  Will load with Vimpat in addition to Keppra and Dilantin.Will connect to LTM EEG  Status epilepticus, resolved- EEG showed frequent PLDS and brief seizures yesterday, now showing improving Right hemispheric PLEDs Alcohol withdrawal Right subdural hemorrhage with midline shift status post craniotomy   EEG showed PLEDs with GPDs yesterday, prompting video EEG monitoring.   I loaded the patient with Vimpat which did not have a significant effect on the EEG.  Continuous EEG was read as seizures, however has improved and has not had any electrographic seizures since 11 PM last night.  We will continue EEG for 1 more day.     Plan  Continue Keppra and Dilantin.  Will order phenytoin level today   Continue LTM EEG Ativan for alcohol withdrawal Management of SDH per Neurosurgery MRI brain w.o contrast after EEG   Colleen Donahoe [facility] Pager Number [phone] For questions after 7pm please refer to AMION to reach  the Neurologist on call    This patient is neurologically critically ill due torecurrent seizures, Subdural hemorrhage s/p craniotomy. He is at risk for significant risk of neurological worsening from Worsening seizures,infection, respiratory failure, stroke. This patient's care requires constant monitoring of vital signs, hemodynamics, respiratory and cardiac monitoring, review of multiple databases, neurological assessment, discussion with family, other specialists and medical decision making of high complexity.  I spent71minutes of neurocritical time in the care of this patient.    Juan Juarez [facility] Pager Number [phone] For questions after 7pm please refer to AMION to reach the Neurologist on call

## 2018-01-25 NOTE — Progress Notes (Signed)
Patient ID: Juan Juarez, male   DOB: December 04, 1955, 62 y.o.   MRN: [id number] Patient appears to be neurologically stable agitated but awake will follow some simple commands and moves all extremities well

## 2018-01-25 NOTE — Progress Notes (Addendum)
Cortrak Tube Team Note:  Consult received to place a Cortrak feeding tube.   A 10 F Cortrak tube was placed in the L nare and secured with a nasal bridle at 80 cm. Per the Cortrak monitor reading the tube tip is distal stomach/d1  Placement took significant effortt/time. Required multiple staff members to hold in addition to existing wrists and abdomen restraints. Cortrak monitor reading somewhat atypical. Did not flush. X-ray is required, abdominal x-ray has been ordered by the Cortrak team. Please confirm tube placement before using the Cortrak tube. Will finish securing bridle/flushing once placement confirmed.   If the tube becomes dislodged please keep the tube and contact the Cortrak team at [email or web address] (password [email or web address]) for replacement.  If after hours and replacement cannot be delayed, place a NG tube and confirm placement with an abdominal x-ray.   Addendum: Placement confirmed. MD gave v/o to begin TF. Will use recs left from RD encounter 7/25.   Burtis Junes RD, LDN, CNSC Clinical Nutrition Available Tues-Sat via Pager: [phone] 01/25/2018 12:31 PM
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
## 2018-01-25 NOTE — Progress Notes (Signed)
SLP Cancellation Note  Patient Details Name: Juan Juarez MRN: [id number] DOB: Jun 06, 1956   Cancelled treatment:       Reason Eval/Treat Not Completed: Patient's level of consciousness. SLP attempted to see patient at 0950 and 1440, but patient was not alert, did not open eyes, not following commands.    Sonia Baller, MA, CCC-SLP 01/25/18 5:09 PM

## 2018-01-26 DIAGNOSIS — G40901 Epilepsy, unspecified, not intractable, with status epilepticus: Secondary | ICD-10-CM

## 2018-01-26 LAB — GLUCOSE, CAPILLARY
GLUCOSE-CAPILLARY: 128 mg/dL — AB (ref 70–99)
GLUCOSE-CAPILLARY: 159 mg/dL — AB (ref 70–99)
Glucose-Capillary: 117 mg/dL — ABNORMAL HIGH (ref 70–99)
Glucose-Capillary: 155 mg/dL — ABNORMAL HIGH (ref 70–99)
Glucose-Capillary: 115 mg/dL — ABNORMAL HIGH (ref 70–99)

## 2018-01-26 LAB — AMMONIA: Ammonia: 60 umol/L — ABNORMAL HIGH (ref 9–35)

## 2018-01-26 NOTE — Progress Notes (Signed)
NEUROSURGERY PROGRESS NOTE  Patient resting comfortably in bed. MAE well, occasionally follow simple commands   Temp:  [97.5 F (36.4 C)-98.1 F (36.7 C)] 97.8 F (36.6 C) (07/28 0800) Pulse Rate:  [51-83] 52 (07/28 0700) Resp:  [5-27] 23 (07/28 0700) BP: (73-147)/(52-110) 77/52 (07/28 0700) SpO2:  [96 %-100 %] 98 % (07/28 0700) Weight:  [69.6 kg (153 lb 7 oz)] 69.6 kg (153 lb 7 oz) (07/28 0400)    Eleonore Chiquito, NP 01/26/2018 8:15 AM
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
## 2018-01-26 NOTE — Progress Notes (Signed)
Rehab Admissions Coordinator Note:  Patient was screened by Cleatrice Burke for appropriateness for an Inpatient Acute Rehab Consult per PT and OT recommendations.  At this time, we are recommending Inpatient Rehab consult.  Cleatrice Burke 01/26/2018, 7:37 PM  I can be reached at [phone].

## 2018-01-26 NOTE — Evaluation (Signed)
Occupational Therapy Evaluation Patient Details Name: Juan Juarez MRN: [id number] DOB: 1955-12-15 Today's Date: 01/26/2018    History of Present Illness 62 y.o. male with a history of hepatic cirrhosis, hepatitis C, polysubstance abuse, IDDM2, and HTN who was admitted 01/21/18 with seizure. He had been admitted 3 weeks prior to this admission with SDH.  Repeat CT head showed stable right SDH with 27mm R>L midline shift.  Neurosurgery performed SDH evacuation with bur holes x2 on 01/22/18.     Clinical Impression   Pt independent with ADL and mobility PTA. Currently pt requires total assist +2 for ADL and functional mobility. Pt awake but restless, confused, not appropriately following commands, and with poor safety awareness. Recommending CIR level therapies to maximize independence and safety with ADL and functional mobility prior to return home. Pt would benefit from continued skilled OT to address established goals.    Follow Up Recommendations  CIR;Supervision/Assistance - 24 hour    Equipment Recommendations  Other (comment)(TBD at next venue)    Recommendations for Other Services Rehab consult     Precautions / Restrictions Precautions Precautions: Fall;Other (comment) Precaution Comments: restraints Restrictions Weight Bearing Restrictions: No      Mobility Bed Mobility Overal bed mobility: Needs Assistance Bed Mobility: Rolling;Sidelying to Sit;Sit to Supine Rolling: Max assist;+2 for physical assistance;+2 for safety/equipment Sidelying to sit: Max assist;+2 for physical assistance;+2 for safety/equipment   Sit to supine: Max assist;+2 for physical assistance;+2 for safety/equipment   General bed mobility comments: Max assist for safety and impulsivity, poor ability to follow commands and perform task  Transfers Overall transfer level: Needs assistance Equipment used: 2 person hand held assist(wrap around support with chuck pad) Transfers: Sit to/from Stand Sit to  Stand: Total assist;+2 physical assistance;+2 safety/equipment;From elevated surface         General transfer comment: unable to come to upright, modest clearance of buttocks from surface    Balance Overall balance assessment: Needs assistance Sitting-balance support: Feet supported Sitting balance-Leahy Scale: Poor Sitting balance - Comments: max assist to maintain sitting balance Postural control: (anterior lean, impulsive unable to maintain safely EOB +2)   Standing balance-Leahy Scale: Zero                             ADL either performed or assessed with clinical judgement   ADL Overall ADL's : Needs assistance/impaired   Eating/Feeding Details (indicate cue type and reason): SLP assisting with feeding trials at EOB; pt requires total assist with max cues for initiation of swallow and attention to task                                   General ADL Comments: Pt requires total assist for ADL at this time with max multimodal cues for attention, sequencing, initiation, and safety.     Vision   Additional Comments: Unsure, difficult to assess due to impaired cognition. Needs further assessment     Perception     Praxis      Pertinent Vitals/Pain Pain Assessment: Faces Faces Pain Scale: Hurts little more Pain Location: generalized Pain Intervention(s): Monitored during session;Repositioned     Hand Dominance     Extremity/Trunk Assessment Upper Extremity Assessment Upper Extremity Assessment: Generalized weakness;Difficult to assess due to impaired cognition(moving bil UEs but unable to formally assess)   Lower Extremity Assessment Lower Extremity Assessment: Defer to PT evaluation  Cervical / Trunk Assessment Cervical / Trunk Assessment: Normal   Communication Communication Communication: Expressive difficulties   Cognition Arousal/Alertness: Awake/alert Behavior During Therapy: Restless;Impulsive;Anxious Overall Cognitive Status:  Impaired/Different from baseline Area of Impairment: Orientation;Attention;Memory;Following commands;Safety/judgement;Awareness;Problem solving                 Orientation Level: Disoriented to;Place;Time;Situation Current Attention Level: Focused Memory: Decreased recall of precautions;Decreased short-term memory Following Commands: Follows one step commands inconsistently Safety/Judgement: Decreased awareness of safety;Decreased awareness of deficits Awareness: (pre intellectual) Problem Solving: Requires tactile cues;Requires verbal cues;Difficulty sequencing;Slow processing General Comments: patient extremely confused throughout session, max attempts to direct to task. Patient with significant language of confusion and perseveration.    General Comments       Exercises     Shoulder Instructions      Home Living Family/patient expects to be discharged to:: Unsure Living Arrangements: Spouse/significant other                                      Prior Functioning/Environment Level of Independence: Independent                 OT Problem List: Decreased strength;Decreased activity tolerance;Impaired balance (sitting and/or standing);Impaired vision/perception;Decreased coordination;Decreased cognition;Decreased safety awareness;Decreased knowledge of use of DME or AE;Impaired UE functional use;Pain      OT Treatment/Interventions: Self-care/ADL training;Therapeutic exercise;Neuromuscular education;Energy conservation;DME and/or AE instruction;Therapeutic activities;Cognitive remediation/compensation;Visual/perceptual remediation/compensation;Patient/family education;Balance training    OT Goals(Current goals can be found in the care plan section) Acute Rehab OT Goals Patient Stated Goal: none stated OT Goal Formulation: Patient unable to participate in goal setting Time For Goal Achievement: 02/09/18 Potential to Achieve Goals: Good ADL Goals Pt  Will Perform Grooming: with mod assist;sitting Additional ADL Goal #1: Pt will follow one step command 3/5 trials in a non distracting environment. Additional ADL Goal #2: Pt will sit EOB with min assist x10 minutes during ADL. Additional ADL Goal #3: Pt will perform bed mobility with min assist as precursor to ADL.  OT Frequency: Min 3X/week   Barriers to D/C:            Co-evaluation PT/OT/SLP Co-Evaluation/Treatment: Yes Reason for Co-Treatment: Complexity of the patient's impairments (multi-system involvement);For patient/therapist safety;Necessary to address cognition/behavior during functional activity PT goals addressed during session: Mobility/safety with mobility OT goals addressed during session: ADL's and self-care;Strengthening/ROM SLP goals addressed during session: Cognition;Swallowing    AM-PAC PT "6 Clicks" Daily Activity     Outcome Measure Help from another person eating meals?: Total Help from another person taking care of personal grooming?: Total Help from another person toileting, which includes using toliet, bedpan, or urinal?: Total Help from another person bathing (including washing, rinsing, drying)?: Total Help from another person to put on and taking off regular upper body clothing?: Total Help from another person to put on and taking off regular lower body clothing?: Total 6 Click Score: 6   End of Session Nurse Communication: Mobility status(RN present for session)  Activity Tolerance: Patient tolerated treatment well Patient left: in bed;with call bell/phone within reach;with nursing/sitter in room;with restraints reapplied;with SCD's reapplied  OT Visit Diagnosis: Unsteadiness on feet (R26.81);Other abnormalities of gait and mobility (R26.89);Muscle weakness (generalized) (M62.81);Cognitive communication deficit (R41.841)                Time: 2703-5009 OT Time Calculation (min): 20 min Charges:  OT General Charges $OT Visit: 1 Visit OT  Evaluation $OT Eval Moderate Complexity: 1 Mod  Ignacio Lowder A. Ulice Brilliant, M.S., OTR/L Acute Rehab Department: [phone]  Binnie Kand 01/26/2018, 1:01 PM

## 2018-01-26 NOTE — Procedures (Signed)
Electroencephalography report.  Long-term monitoring  Number of study :day 2  Recording begins 01/25/2018 at 08 03 am  Recording and 01/26/2018 at 8:03 AM  Data acquisition: [employer] 10-20 for eligible placement.  18 channels EEG with additional EKG channel  Day 1 Background activities marked by continuous background activity slowing.  Superimposed right hemispheric lateralized periodic epileptiform discharges present in a continuous fashion alternating with very frequent electrographic seizures across right hemisphere.  Maximum negativity of right periodic lateralized epileptiform discharges and electrographic seizures in the right central parietal cortex.  Negative field extend to midline and at times to the paracentral left hemispheric cortex.  As recording progressed there is a resolution of subclinical electrographic seizures with last brief seizure around 2300.  During second half of the recording improvement noted with less prominent right hemispheric periodic lateralized epileptiform discharges and background activities away activity.  No seizures were present during second half of the recording.  Day 2: No clinical or subclinical seizures present throughout the recording.  Continues background activities with occasionally right hemispheric sharp waves.  And largely resolution of prominent right hemispheric periodic lateralized epileptiform discharges.  Clinical interpretation: This day 2 of intensive EEG monitoring with simultaneous video monitoring demonstrating improvement and resolution of  subclinical electrographic seizures.   continuous background activities marked by improvement and largely resolution of right periodic lateralized epileptiform discharges.  Occasionally right hemispheric sharp waves still present maximum negativity in the right central parietal cortex suggestive of some degree of cortical irritability still present across right hemisphere.

## 2018-01-26 NOTE — Evaluation (Signed)
Physical Therapy Evaluation Patient Details Name: Juan Juarez MRN: [id number] DOB: [birth year] Today's Date: 01/26/2018   History of Present Illness  62 y.o. male with a history of hepatic cirrhosis, hepatitis C, polysubstance abuse, IDDM2, and HTN who was admitted 01/21/18 with seizure. He had been admitted 3 weeks prior to this admission with SDH.  Repeat CT head showed stable right SDH with 46mm R>L midline shift.  Neurosurgery performed SDH evacuation with bur holes x2 on 01/22/18.    Clinical Impression  Orders received for PT evaluation. Patient demonstrates deficits in functional mobility as indicated below. Will benefit from continued skilled PT to address deficits and maximize function. Will see as indicated and progress as tolerated.  At this time, patient presenting with significant cognitive deficits and limited physical mobility requiring +2 physical assist. Anticipate patient will need comprehensive therapies upon acute discharge to maximize potential for recovery. Will recommend CIR consult at this time and follow as indicated.    Follow Up Recommendations CIR    Equipment Recommendations  Other (comment)(TBD)    Recommendations for Other Services       Precautions / Restrictions Precautions Precautions: Fall;Other (comment) Precaution Comments: restraints Restrictions Weight Bearing Restrictions: No      Mobility  Bed Mobility Overal bed mobility: Needs Assistance Bed Mobility: Rolling;Sidelying to Sit;Sit to Supine Rolling: Max assist;+2 for physical assistance;+2 for safety/equipment Sidelying to sit: Max assist;+2 for physical assistance;+2 for safety/equipment   Sit to supine: Max assist;+2 for physical assistance;+2 for safety/equipment   General bed mobility comments: Max assist for safety and impulsivity, poor ability to follow commands and perform task  Transfers Overall transfer level: Needs assistance Equipment used: 2 person hand held assist(wrap  around support with chuck pad) Transfers: Sit to/from Stand Sit to Stand: Total assist;+2 physical assistance;+2 safety/equipment;From elevated surface         General transfer comment: unable to come to upright, modest clearance of buttocks from surface  Ambulation/Gait             General Gait Details: unable  Stairs            Wheelchair Mobility    Modified Rankin (Stroke Patients Only) Modified Rankin (Stroke Patients Only) Pre-Morbid Rankin Score: No symptoms Modified Rankin: Severe disability     Balance Overall balance assessment: Needs assistance Sitting-balance support: Feet supported Sitting balance-Leahy Scale: Poor Sitting balance - Comments: max assist to maintain sitting balance Postural control: (anterior lean, impulsive unable to maintain safely EOB +2)                                   Pertinent Vitals/Pain Pain Assessment: Faces Faces Pain Scale: Hurts little more Pain Intervention(s): Monitored during session;Repositioned    Home Living Family/patient expects to be discharged to:: Unsure Living Arrangements: Spouse/significant other                    Prior Function Level of Independence: Independent               Hand Dominance        Extremity/Trunk Assessment   Upper Extremity Assessment Upper Extremity Assessment: Generalized weakness;Difficult to assess due to impaired cognition(moving extremities but unable to assess formally)    Lower Extremity Assessment Lower Extremity Assessment: Generalized weakness;Difficult to assess due to impaired cognition(moving extremities but unable to assess formally)       Communication   Communication: Expressive difficulties  Cognition Arousal/Alertness: Awake/alert Behavior During Therapy: Restless;Impulsive;Anxious Overall Cognitive Status: Impaired/Different from baseline Area of Impairment: Orientation;Attention;Memory;Following  commands;Safety/judgement;Awareness;Problem solving                 Orientation Level: Disoriented to;Place;Time;Situation Current Attention Level: Focused Memory: Decreased recall of precautions;Decreased short-term memory Following Commands: Follows one step commands inconsistently Safety/Judgement: Decreased awareness of safety;Decreased awareness of deficits Awareness: (pre-intellectual) Problem Solving: Requires tactile cues;Requires verbal cues;Difficulty sequencing;Slow processing General Comments: patient extremely confused throughout session, max attempts to direct to task. Patient with significant language of confusion and perseveration.       General Comments      Exercises     Assessment/Plan    PT Assessment Patient needs continued PT services  PT Problem List Decreased strength;Decreased activity tolerance;Decreased balance;Decreased mobility;Decreased coordination;Decreased cognition;Decreased safety awareness;Pain       PT Treatment Interventions Functional mobility training;Therapeutic exercise;Therapeutic activities;Balance training;Neuromuscular re-education;Cognitive remediation;Patient/family education;Gait training    PT Goals (Current goals can be found in the Care Plan section)  Acute Rehab PT Goals PT Goal Formulation: Patient unable to participate in goal setting Time For Goal Achievement: 02/09/18    Frequency Min 3X/week   Barriers to discharge Decreased caregiver support      Co-evaluation PT/OT/SLP Co-Evaluation/Treatment: Yes Reason for Co-Treatment: Complexity of the patient's impairments (multi-system involvement) PT goals addressed during session: Mobility/safety with mobility OT goals addressed during session: ADL's and self-care SLP goals addressed during session: Cognition;Swallowing     AM-PAC PT "6 Clicks" Daily Activity  Outcome Measure Difficulty turning over in bed (including adjusting bedclothes, sheets and blankets)?:  Unable Difficulty moving from lying on back to sitting on the side of the bed? : Unable Difficulty sitting down on and standing up from a chair with arms (e.g., wheelchair, bedside commode, etc,.)?: Unable Help needed moving to and from a bed to chair (including a wheelchair)?: A Lot Help needed walking in hospital room?: Total Help needed climbing 3-5 steps with a railing? : Total 6 Click Score: 7    End of Session   Activity Tolerance: Treatment limited secondary to agitation Patient left: in bed;with call bell/phone within reach;with bed alarm set;with restraints reapplied;with nursing/sitter in room Nurse Communication: Mobility status;Precautions PT Visit Diagnosis: Difficulty in walking, not elsewhere classified (R26.2);Other symptoms and signs involving the nervous system (R29.898)    Time: 9242-6834 PT Time Calculation (min) (ACUTE ONLY): 25 min   Charges:   PT Evaluation $PT Eval Moderate Complexity: 1 Mod          Alben Deeds, PT DPT  Board Certified Neurologic Specialist [place] 01/26/2018, 12:33 PM

## 2018-01-26 NOTE — Progress Notes (Signed)
Reason for consult:   Subjective: Patient continues to be altered, however intermittently follows commands. Phenytoinlevels checked yesterday was significantly high at 36, corrected at 60.  Being held today  ROS:  Unable to obtain due to poor mental status  Examination  Vital signs in last 24 hours: Temp:  [97 F (36.1 C)-98.1 F (36.7 C)] 97 F (36.1 C) (07/28 1155) Pulse Rate:  [51-75] 75 (07/28 1300) Resp:  [5-27] 15 (07/28 1300) BP: (73-128)/(52-89) 115/58 (07/28 1300) SpO2:  [96 %-100 %] 98 % (07/28 1300) Weight:  [69.6 kg (153 lb 7 oz)] 69.6 kg (153 lb 7 oz) (07/28 0400)  General: Not in distress, cooperative Head: Sutures over scalp  CVS: pulse-normal rate and rhythm RS: breathing comfortably Extremities: normal   Neuro: MS: Patient more awake today, follows commands intermittently.  Speech remains incoherent. CN: Pupils are equal and reactive, tracks examiner in the room.  Face is symmetric. Motor: Moves with good strength in all 4 extremities.  Is restrained to the bed Gait: not tested  Basic Metabolic Panel: Recent Labs  Lab 01/21/18 2107 01/21/18 2109 01/22/18 0455 01/23/18 0345 01/24/18 0325 01/25/18 0155  NA  --  136 138 137 135 134*  K  --  4.7 4.2 4.0 4.2 4.5  CL  --  105 110 105 102 99  CO2  --  26 22 23 24 23   GLUCOSE  --  181* 136* 109* 141* 105*  BUN  --  15 15 7* 9 10  CREATININE  --  0.86 0.79 0.84 0.95 0.86  CALCIUM  --  8.5* 8.1* 8.2* 8.3* 8.2*  MG 1.5*  --  1.4* 1.6* 1.6* 1.5*  PHOS  --   --   --   --   --  3.3    CBC: Recent Labs  Lab 01/20/18 1020 01/20/18 1055 01/21/18 2109 01/22/18 0455 01/23/18 0345 01/24/18 0325  WBC 6.8  --  5.6 6.4 8.4 9.5  NEUTROABS 3.7  --  3.1 3.2  --   --   HGB 13.6 12.6* 12.7* 11.8* 12.1* 12.4*  HCT 38.6* 37.0* 38.0* 34.5* 35.5* 36.1*  MCV 97.2  --  100.5* 98.9 97.5 98.1  PLT 120*  --  102* 102* 103* 97*     Coagulation Studies: No results for input(s): LABPROT, INR in the last 72  hours.  Imaging Reviewed:   Clinical interpretation: This day 2 of intensive EEG monitoring with simultaneous video monitoring demonstrating improvement and resolution of  subclinical electrographic seizures.   continuous background activities marked by improvement and largely resolution of right periodic lateralized epileptiform discharges.  Occasionally right hemispheric sharp waves still present maximum negativity in the right central parietal cortex suggestive of some degree of cortical irritability still present across right hemisphere.    ASSESSMENT AND PLAN   50 Y Male with large right SDH with midline shift presents with status epilepticus. He also has history of alcohol abuse, cocaine abuse.  Seizures resolved after keppra, however on the following day noted to have partial seizure when rounding. Loaded with Dilantin and was taken to the OR on 01/22/18.  Following surgical decompression, patient has been severely dysarthric, following commands intermittently and agitated. EEG initially showed lites and brief seizures.  Has gradually improved.  On Dilantin and Keppra.  Dilantin level was checked yesterday supratherapeutic-has been held.   Status epilepticus, resolved- EEG showing improved PLEDs Alcohol withdrawal Right subdural hemorrhage with midline shift status postcraniotomy Mildy elevated ammonia on lactulose Phenytoin toxicity  Plan D/C LTM EEG Continue Keppra  Recheck Dilantin level tomorrow morning F/U on repeat Ammonia Ativan for alcohol withdrawal Management of SDH per Neurosurgery MRI brain w.o contrast after EEG       Karima Carrell [facility] Pager Number [phone] For questions after 7pm please refer to AMION to reach the Neurologist on call
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
## 2018-01-27 ENCOUNTER — Inpatient Hospital Stay: Payer: Self-pay

## 2018-01-27 ENCOUNTER — Inpatient Hospital Stay (HOSPITAL_COMMUNITY): Payer: Medicare Other

## 2018-01-27 DIAGNOSIS — R4182 Altered mental status, unspecified: Secondary | ICD-10-CM

## 2018-01-27 LAB — BASIC METABOLIC PANEL
ANION GAP: 5 (ref 5–15)
BUN: 11 mg/dL (ref 8–23)
CALCIUM: 8 mg/dL — AB (ref 8.9–10.3)
CO2: 22 mmol/L (ref 22–32)
Chloride: 111 mmol/L (ref 98–111)
Creatinine, Ser: 0.74 mg/dL (ref 0.61–1.24)
GFR calc non Af Amer: >60 mL/min (ref >60)
GLUCOSE: 153 mg/dL — AB (ref 70–99)
Potassium: 4.2 mmol/L (ref 3.5–5.1)
Sodium: 138 mmol/L (ref 135–145)

## 2018-01-27 LAB — GLUCOSE, CAPILLARY
GLUCOSE-CAPILLARY: 155 mg/dL — AB (ref 70–99)
GLUCOSE-CAPILLARY: 173 mg/dL — AB (ref 70–99)
GLUCOSE-CAPILLARY: 174 mg/dL — AB (ref 70–99)
GLUCOSE-CAPILLARY: 178 mg/dL — AB (ref 70–99)
Glucose-Capillary: 157 mg/dL — ABNORMAL HIGH (ref 70–99)
Glucose-Capillary: 166 mg/dL — ABNORMAL HIGH (ref 70–99)
Glucose-Capillary: 177 mg/dL — ABNORMAL HIGH (ref 70–99)

## 2018-01-27 LAB — MAGNESIUM: Magnesium: 1.6 mg/dL — ABNORMAL LOW (ref 1.7–2.4)

## 2018-01-27 LAB — PHENYTOIN LEVEL, TOTAL: Phenytoin Lvl: 26.2 ug/mL — ABNORMAL HIGH (ref 10.0–20.0)

## 2018-01-27 LAB — CBC
HEMATOCRIT: 32.5 % — AB (ref 39.0–52.0)
HEMOGLOBIN: 11.3 g/dL — AB (ref 13.0–17.0)
MCH: 33.8 pg (ref 26.0–34.0)
MCHC: 34.8 g/dL (ref 30.0–36.0)
MCV: 97.3 fL (ref 78.0–100.0)
Platelets: 111 10*3/uL — ABNORMAL LOW (ref 150–400)
RBC: 3.34 MIL/uL — ABNORMAL LOW (ref 4.22–5.81)
RDW: 13.7 % (ref 11.5–15.5)
WBC: 6 10*3/uL (ref 4.0–10.5)

## 2018-01-27 LAB — PHOSPHORUS: Phosphorus: 2.2 mg/dL — ABNORMAL LOW (ref 2.5–4.6)

## 2018-01-27 MED ORDER — CHLORHEXIDINE GLUCONATE 0.12 % MT SOLN
15.0000 mL | Freq: Two times a day (BID) | OROMUCOSAL | Status: DC
  Administered 2018-01-27 – 2018-01-30 (×8): 15 mL via OROMUCOSAL
  Filled 2018-01-27 (×5): qty 15

## 2018-01-27 MED ORDER — MIDAZOLAM HCL 2 MG/2ML IJ SOLN
INTRAMUSCULAR | Status: AC
  Filled 2018-01-27: qty 2

## 2018-01-27 MED ORDER — MAGNESIUM SULFATE 2 GM/50ML IV SOLN
2.0000 g | Freq: Once | INTRAVENOUS | Status: AC
  Administered 2018-01-27: 2 g via INTRAVENOUS
  Filled 2018-01-27: qty 50

## 2018-01-27 MED ORDER — SODIUM CHLORIDE 0.9% FLUSH: 10.0000 mL | INTRAVENOUS | Status: DC | PRN

## 2018-01-27 MED ORDER — SODIUM CHLORIDE 0.9% FLUSH
10.0000 mL | Freq: Two times a day (BID) | INTRAVENOUS | Status: DC
  Administered 2018-01-27 – 2018-01-30 (×6): 10 mL
  Administered 2018-01-31: 20 mL
  Administered 2018-01-31: 10 mL

## 2018-01-27 MED ORDER — CHLORHEXIDINE GLUCONATE CLOTH 2 % EX PADS
6.0000 | MEDICATED_PAD | Freq: Every day | CUTANEOUS | Status: DC
  Administered 2018-01-27 – 2018-01-31 (×5): 6 via TOPICAL

## 2018-01-27 MED ORDER — FUROSEMIDE 10 MG/ML IJ SOLN
20.0000 mg | Freq: Once | INTRAMUSCULAR | Status: AC
  Administered 2018-01-27: 20 mg via INTRAVENOUS
  Filled 2018-01-27: qty 2

## 2018-01-27 MED ORDER — MIDAZOLAM HCL 2 MG/2ML IJ SOLN
1.0000 mg | Freq: Once | INTRAMUSCULAR | Status: AC
  Administered 2018-01-27: 1 mg via INTRAVENOUS

## 2018-01-27 MED ORDER — K PHOS MONO-SOD PHOS DI & MONO 155-852-130 MG PO TABS
500.0000 mg | ORAL_TABLET | Freq: Two times a day (BID) | ORAL | Status: AC
  Administered 2018-01-27 (×2): 500 mg via ORAL
  Filled 2018-01-27 (×2): qty 2

## 2018-01-27 MED ORDER — MIDAZOLAM HCL 2 MG/2ML IJ SOLN: 1.0000 mg | Freq: Once | INTRAMUSCULAR | Status: DC

## 2018-01-27 MED ORDER — ORAL CARE MOUTH RINSE
15.0000 mL | Freq: Two times a day (BID) | OROMUCOSAL | Status: DC
  Administered 2018-01-27 – 2018-01-28 (×3): 15 mL via OROMUCOSAL

## 2018-01-27 NOTE — Progress Notes (Signed)
SLP Cancellation Note  Patient Details Name: Ulysess Witz MRN: [id number] DOB: 08-28-1955   Cancelled treatment:       Reason Eval/Treat Not Completed: Patient at procedure or test/unavailable   Tor Tsuda, Katherene Ponto 01/27/2018, 12:03 PM

## 2018-01-27 NOTE — Progress Notes (Signed)
Patient ID: Juan Juarez, male   DOB: 06-22-1956, 62 y.o.   MRN: [id number] Patient is sedated no exam right now reportedly maintains encephalopathy moving all extremities  No new neurosurgical recommendations
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
## 2018-01-27 NOTE — Progress Notes (Signed)
Peripherally Inserted Central Catheter/Midline Placement  The IV Nurse has discussed with the patient and/or persons authorized to consent for the patient, the purpose of this procedure and the potential benefits and risks involved with this procedure.  The benefits include less needle sticks, lab draws from the catheter, and the patient may be discharged home with the catheter. Risks include, but not limited to, infection, bleeding, blood clot (thrombus formation), and puncture of an artery; nerve damage and irregular heartbeat and possibility to perform a PICC exchange if needed/ordered by physician.  Alternatives to this procedure were also discussed.  Bard Power PICC patient education guide, fact sheet on infection prevention and patient information card has been provided to patient /or left at bedside.  Consent signed by mother via telephone consent.  PICC/Midline Placement Documentation  PICC Triple Lumen 64/15/83 PICC Right Basilic 37 cm 0 cm (Active)  Indication for Insertion or Continuance of Line Vasoactive infusions 01/27/2018 12:48 PM  Exposed Catheter (cm) 0 cm 01/27/2018 12:48 PM  Site Assessment Clean;Dry;Intact 01/27/2018 12:48 PM  Lumen #1 Status Flushed;Saline locked;Blood return noted 01/27/2018 12:48 PM  Lumen #2 Status Flushed;Saline locked;Blood return noted 01/27/2018 12:48 PM  Lumen #3 Status Flushed;Saline locked;Blood return noted 01/27/2018 12:48 PM  Dressing Type Transparent 01/27/2018 12:48 PM  Dressing Status Clean;Intact;Dry;Antimicrobial disc in place 01/27/2018 12:48 PM  Dressing Change Due 02/03/18 01/27/2018 12:48 PM       Gordan Payment 01/27/2018, 12:49 PM

## 2018-01-28 ENCOUNTER — Inpatient Hospital Stay (HOSPITAL_COMMUNITY): Payer: Medicare Other

## 2018-01-28 DIAGNOSIS — R0602 Shortness of breath: Secondary | ICD-10-CM

## 2018-01-28 DIAGNOSIS — I517 Cardiomegaly: Secondary | ICD-10-CM

## 2018-01-28 LAB — COMPREHENSIVE METABOLIC PANEL
ALT: 24 U/L (ref 0–44)
AST: 39 U/L (ref 15–41)
Albumin: 2 g/dL — ABNORMAL LOW (ref 3.5–5.0)
Alkaline Phosphatase: 90 U/L (ref 38–126)
Anion gap: 2 — ABNORMAL LOW (ref 5–15)
BILIRUBIN TOTAL: 0.9 mg/dL (ref 0.3–1.2)
BUN: 10 mg/dL (ref 8–23)
CO2: 21 mmol/L — ABNORMAL LOW (ref 22–32)
Calcium: 6.9 mg/dL — ABNORMAL LOW (ref 8.9–10.3)
Chloride: 116 mmol/L — ABNORMAL HIGH (ref 98–111)
Creatinine, Ser: 0.54 mg/dL — ABNORMAL LOW (ref 0.61–1.24)
GFR calc Af Amer: >60 mL/min (ref >60)
Glucose, Bld: 150 mg/dL — ABNORMAL HIGH (ref 70–99)
Potassium: 5.5 mmol/L — ABNORMAL HIGH (ref 3.5–5.1)
Sodium: 139 mmol/L (ref 135–145)
TOTAL PROTEIN: 6 g/dL — AB (ref 6.5–8.1)

## 2018-01-28 LAB — POCT I-STAT 3, ART BLOOD GAS (G3+)
Acid-base deficit: 3 mmol/L — ABNORMAL HIGH (ref 0.0–2.0)
Bicarbonate: 21.1 mmol/L (ref 20.0–28.0)
O2 SAT: 96 %
PCO2 ART: 33.5 mmHg (ref 32.0–48.0)
Patient temperature: 98.6
TCO2: 22 mmol/L (ref 22–32)
pH, Arterial: 7.408 (ref 7.350–7.450)
pO2, Arterial: 82 mmHg — ABNORMAL LOW (ref 83.0–108.0)

## 2018-01-28 LAB — GLUCOSE, CAPILLARY
GLUCOSE-CAPILLARY: 136 mg/dL — AB (ref 70–99)
GLUCOSE-CAPILLARY: 183 mg/dL — AB (ref 70–99)
GLUCOSE-CAPILLARY: 239 mg/dL — AB (ref 70–99)
GLUCOSE-CAPILLARY: 135 mg/dL — AB (ref 70–99)
GLUCOSE-CAPILLARY: 134 mg/dL — AB (ref 70–99)
Glucose-Capillary: 224 mg/dL — ABNORMAL HIGH (ref 70–99)

## 2018-01-28 LAB — AMYLASE: Amylase: 66 U/L (ref 28–100)

## 2018-01-28 LAB — CBC WITH DIFFERENTIAL/PLATELET
Abs Immature Granulocytes: 0 10*3/uL (ref 0.0–0.1)
BASOS PCT: 1 %
Basophils Absolute: 0 10*3/uL (ref 0.0–0.1)
EOS ABS: 0.1 10*3/uL (ref 0.0–0.7)
Eosinophils Relative: 1 %
HCT: 28.5 % — ABNORMAL LOW (ref 39.0–52.0)
Hemoglobin: 9.5 g/dL — ABNORMAL LOW (ref 13.0–17.0)
Immature Granulocytes: 0 %
Lymphocytes Relative: 34 %
Lymphs Abs: 1.9 10*3/uL (ref 0.7–4.0)
MCH: 33.3 pg (ref 26.0–34.0)
MCHC: 33.3 g/dL (ref 30.0–36.0)
MCV: 100 fL (ref 78.0–100.0)
Monocytes Absolute: 0.5 10*3/uL (ref 0.1–1.0)
Monocytes Relative: 9 %
Neutro Abs: 3.1 10*3/uL (ref 1.7–7.7)
Neutrophils Relative %: 55 %
Platelets: 84 10*3/uL — ABNORMAL LOW (ref 150–400)
RBC: 2.85 MIL/uL — ABNORMAL LOW (ref 4.22–5.81)
RDW: 13.9 % (ref 11.5–15.5)
WBC: 5.5 10*3/uL (ref 4.0–10.5)

## 2018-01-28 LAB — MAGNESIUM: Magnesium: 1.5 mg/dL — ABNORMAL LOW (ref 1.7–2.4)

## 2018-01-28 LAB — PHENYTOIN LEVEL, TOTAL: Phenytoin Lvl: 19.2 ug/mL (ref 10.0–20.0)

## 2018-01-28 LAB — LIPASE, BLOOD: Lipase: 58 U/L — ABNORMAL HIGH (ref 11–51)

## 2018-01-28 LAB — ECHOCARDIOGRAM COMPLETE
HEIGHTINCHES: 67 in
WEIGHTICAEL: 2444.46 [oz_av]

## 2018-01-28 LAB — PHOSPHORUS: Phosphorus: 2.5 mg/dL (ref 2.5–4.6)

## 2018-01-28 LAB — AMMONIA: AMMONIA: 38 umol/L — AB (ref 9–35)

## 2018-01-28 MED ORDER — MIDAZOLAM HCL 2 MG/2ML IJ SOLN
2.0000 mg | Freq: Once | INTRAMUSCULAR | Status: AC
  Administered 2018-01-28: 2 mg via INTRAVENOUS

## 2018-01-28 MED ORDER — OLANZAPINE 2.5 MG PO TABS
5.0000 mg | ORAL_TABLET | Freq: Every day | ORAL | Status: DC
  Administered 2018-01-28 – 2018-02-03 (×7): 5 mg via ORAL
  Filled 2018-01-28 (×4): qty 1
  Filled 2018-01-28 (×2): qty 2
  Filled 2018-01-28: qty 1

## 2018-01-28 MED ORDER — FUROSEMIDE 10 MG/ML IJ SOLN
20.0000 mg | Freq: Two times a day (BID) | INTRAMUSCULAR | Status: DC
  Administered 2018-01-28 (×2): 20 mg via INTRAVENOUS
  Filled 2018-01-28 (×2): qty 2

## 2018-01-28 MED ORDER — LACTATED RINGERS IV SOLN
INTRAVENOUS | Status: DC
  Administered 2018-01-28: 10:00:00 via INTRAVENOUS

## 2018-01-28 MED ORDER — MORPHINE SULFATE (PF) 2 MG/ML IV SOLN
1.0000 mg | INTRAVENOUS | Status: DC | PRN
  Administered 2018-01-28 – 2018-02-02 (×7): 2 mg via INTRAVENOUS
  Filled 2018-01-28 (×7): qty 1

## 2018-01-28 MED ORDER — MIDAZOLAM HCL 2 MG/2ML IJ SOLN
INTRAMUSCULAR | Status: AC
  Filled 2018-01-28: qty 2

## 2018-01-28 NOTE — Progress Notes (Signed)
01/28/18 1002  PT Visit Information  Last PT Received On 01/28/18  Assistance Needed +2  PT/OT/SLP Co-Evaluation/Treatment Yes  Reason for Co-Treatment Complexity of the patient's impairments (multi-system involvement);Necessary to address cognition/behavior during functional activity;For patient/therapist safety  PT goals addressed during session Mobility/safety with mobility  OT goals addressed during session ADL's and self-care  History of Present Illness 62 y.o. male with a history of hepatic cirrhosis, hepatitis C, polysubstance abuse, IDDM2, and HTN who was admitted 01/21/18 with seizure. He had been admitted 3 weeks prior to this admission with SDH.  Repeat CT head showed stable right SDH with 3mm R>L midline shift.  Neurosurgery performed SDH evacuation with bur holes x2 on 01/22/18.    Subjective Data  Patient Stated Goal "get out of here"  Precautions  Precautions Fall;Other (comment)  Precaution Comments restraints  Restrictions  Weight Bearing Restrictions No  Pain Assessment  Pain Assessment Faces  Faces Pain Scale 0  Cognition  Arousal/Alertness Awake/alert  Behavior During Therapy Restless;Impulsive;Agitated  Overall Cognitive Status Impaired/Different from baseline  Area of Impairment Orientation;Attention;Memory;Following commands;Safety/judgement;Awareness;Problem solving  Orientation Level Disoriented to;Place;Time;Situation  Current Attention Level Focused  Memory Decreased recall of precautions;Decreased short-term memory  Following Commands Follows one step commands inconsistently  Safety/Judgement Decreased awareness of safety;Decreased awareness of deficits  Awareness  (pre intellectual)  Problem Solving Requires tactile cues;Requires verbal cues;Difficulty sequencing;Slow processing  General Comments Pt following one step commands inconsistently today, able to read name badge and identify objects on command. Pt with periods of clear, calm, and following  commands and then period of agitation with poor safety awareness  Bed Mobility  Overal bed mobility Needs Assistance  Bed Mobility Supine to Sit;Sit to Supine  Supine to sit Max assist;+2 for physical assistance  Sit to supine Max assist;+2 for physical assistance  General bed mobility comments Continues to require max assist +2 due to impulsivity and poor safety awareness with all aspects of bed mobility. Pt initiating movement more today though  Transfers  Overall transfer level Needs assistance  Equipment used 2 person hand held assist  Transfers Sit to/from Stand  Sit to Stand Max assist;+2 physical assistance  General transfer comment Pt initiating sit to stand, spontaneously perfoms sit to stand with less than max assist +2 but pt inconsistent with following commands and continues to require max assist +2 for safety  Modified Rankin (Stroke Patients Only)  Pre-Morbid Rankin Score 0  Modified Rankin 5  Balance  Overall balance assessment Needs assistance  Sitting-balance support Feet supported  Sitting balance-Leahy Scale Poor  Sitting balance - Comments brief periods of min guard to max assist for sitting balance. Pt with anterior lean, will spontaneously correct but not to command  Standing balance support Bilateral upper extremity supported  Standing balance-Leahy Scale Poor  Standing balance comment Does initiate stand and holds himself in static standing but continues to require max assist for safety due to impulsivity  PT - End of Session  Activity Tolerance Treatment limited secondary to agitation  Patient left in bed;with call bell/phone within reach;with bed alarm set;with restraints reapplied;with nursing/sitter in room  Nurse Communication Mobility status;Precautions   PT - Assessment/Plan  PT Plan Current plan remains appropriate  PT Visit Diagnosis Difficulty in walking, not elsewhere classified (R26.2);Other symptoms and signs involving the nervous system (R29.898)   PT Frequency (ACUTE ONLY) Min 3X/week  Follow Up Recommendations CIR  PT equipment Other (comment) (TBD)  AM-PAC PT "6 Clicks" Daily Activity Outcome Measure  Difficulty turning  over in bed (including adjusting bedclothes, sheets and blankets)? 1  Difficulty moving from lying on back to sitting on the side of the bed?  1  Difficulty sitting down on and standing up from a chair with arms (e.g., wheelchair, bedside commode, etc,.)? 1  Help needed moving to and from a bed to chair (including a wheelchair)? 2  Help needed walking in hospital room? 1  Help needed climbing 3-5 steps with a railing?  1  6 Click Score 7  Mobility G Code  CM  PT Goal Progression  Progress towards PT goals Progressing toward goals  Acute Rehab PT Goals  PT Goal Formulation Patient unable to participate in goal setting  Time For Goal Achievement 02/09/18  PT Time Calculation  PT Start Time (ACUTE ONLY) 0927  PT Stop Time (ACUTE ONLY) 0957  PT Time Calculation (min) (ACUTE ONLY) 30 min  PT General Charges  $$ ACUTE PT VISIT 1 Visit  PT Treatments  $Therapeutic Activity 8-22 mins    Alben Deeds, PT DPT  Board Certified Neurologic Specialist [phone]

## 2018-01-28 NOTE — Progress Notes (Signed)
  Echocardiogram 2D Echocardiogram has been performed.  Juan Juarez G Audreana Hancox 01/28/2018, 2:07 PM

## 2018-01-28 NOTE — Progress Notes (Signed)
  Echocardiogram 2D Echocardiogram has been attempted. Patient going to CT. Nurse asked to return at a later time.  Juan Juarez G [name] 01/28/2018, 10:30 AM
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
## 2018-01-28 NOTE — Care Management Note (Signed)
Case Management Note  Patient Details  Name: Juan Juarez MRN: [id number] Date of Birth: 07-17-1955  Subjective/Objective:     Pt admitted with seizures. He is from home with significant other.                 Action/Plan: Neurosurgery recommending [facility] for subdural hematoma. Awaiting family to arrive to hospital vs pt to become more oriented for consent. CM following for d/c needs post [employer].   Expected Discharge Date:                  Expected Discharge Plan:  [place]  In-House Referral:     Discharge planning Services  CM Consult  Post Acute Care Choice:    Choice offered to:     DME Arranged:    DME Agency:     HH Arranged:    [place] Agency:     Status of Service:  In process, will continue to follow  If discussed at Long Length of Stay Meetings, dates discussed:    Additional Comments:  01/28/18 J. Adell Koval, RN, BSN Pt s/p RT Bur holes procedure on 01/22/18 for evacuation of SDH; now in suspected ETOH withdrawal on Precedex drip.  PT/OT recommending CIR once withdrawal symptoms improved.  Will continue to follow as pt progresses.     Reinaldo Raddle, RN, BSN  Trauma/Neuro ICU Case Manager [phone]

## 2018-01-28 NOTE — Progress Notes (Signed)
SLP Cancellation Note  Patient Details Name: Juan Juarez MRN: [id number] DOB: 16-Jul-1955   Cancelled treatment:       Reason Eval/Treat Not Completed: Patient at procedure or test/unavailable. Attempted x2 this am. Pt in procedures.    Maryssa Giampietro, Katherene Ponto 01/28/2018, 1:10 PM

## 2018-01-28 NOTE — Progress Notes (Signed)
Occupational Therapy Treatment Patient Details Name: Juan Juarez MRN: [id number] DOB: 03-03-1956 Today's Date: 01/28/2018    History of present illness 62 y.o. male with a history of hepatic cirrhosis, hepatitis C, polysubstance abuse, IDDM2, and HTN who was admitted 01/21/18 with seizure. He had been admitted 3 weeks prior to this admission with SDH.  Repeat CT head showed stable right SDH with 20mm R>L midline shift.  Neurosurgery performed SDH evacuation with bur holes x2 on 01/22/18.     OT comments  Pt with mild improvements in cognition today; inconsistently following one step commands, able to identify objects, and read therapist name tag to command. Pt spontaneously performed sit to stand with less than max assist +2 but continues to require that level of assist for safe transfers. Pt able to sit EOB and perform grooming task with mod assist. D/c plan remains appropriate. Will continue to follow acutely.   Follow Up Recommendations  CIR;Supervision/Assistance - 24 hour    Equipment Recommendations  Other (comment)(TBD at next venue)    Recommendations for Other Services Rehab consult    Precautions / Restrictions Precautions Precautions: Fall;Other (comment) Precaution Comments: restraints Restrictions Weight Bearing Restrictions: No       Mobility Bed Mobility Overal bed mobility: Needs Assistance Bed Mobility: Supine to Sit;Sit to Supine     Supine to sit: Max assist;+2 for physical assistance Sit to supine: Max assist;+2 for physical assistance   General bed mobility comments: Continues to require max assist +2 due to impulsivity and poor safety awareness with all aspects of bed mobility. Pt initiating movement more today though  Transfers Overall transfer level: Needs assistance Equipment used: 2 person hand held assist Transfers: Sit to/from Stand Sit to Stand: Max assist;+2 physical assistance         General transfer comment: Pt initiating sit to stand,  spontaneously perfoms sit to stand with less than max assist +2 but pt inconsistent with following commands and continues to require max assist +2 for safety    Balance Overall balance assessment: Needs assistance Sitting-balance support: Feet supported Sitting balance-Leahy Scale: Poor Sitting balance - Comments: brief periods of min guard to max assist for sitting balance. Pt with anterior lean, will spontaneously correct but not to command   Standing balance support: Bilateral upper extremity supported Standing balance-Leahy Scale: Poor Standing balance comment: Does initiate stand and holds himself in static standing but continues to require max assist for safety due to impulsivity                           ADL either performed or assessed with clinical judgement   ADL Overall ADL's : Needs assistance/impaired     Grooming: Moderate assistance;Sitting;Wash/dry face;Cueing for sequencing;Cueing for safety Grooming Details (indicate cue type and reason): Min assist to support arm, assist for sitting balance at EOB. Cues throughout for safety and sequencing but pt able to identify "washcloth" and wash face with increased time                             Functional mobility during ADLs: Maximal assistance;+2 for physical assistance(for sit to stand) General ADL Comments: Pt initiating more movement today; initiates sit to stand and performs static standing but continues to require max assist +2 due to impulsivity and poor safety awareness     Vision   Additional Comments: Pt able to read "Occupational Therapist" on bottom of name  tag and able to read therapist name from badge on command   Perception     Praxis      Cognition Arousal/Alertness: Awake/alert Behavior During Therapy: Restless;Impulsive;Agitated Overall Cognitive Status: Impaired/Different from baseline Area of Impairment: Orientation;Attention;Memory;Following  commands;Safety/judgement;Awareness;Problem solving                 Orientation Level: Disoriented to;Place;Time;Situation Current Attention Level: Focused Memory: Decreased recall of precautions;Decreased short-term memory Following Commands: Follows one step commands inconsistently Safety/Judgement: Decreased awareness of safety;Decreased awareness of deficits Awareness: (pre intellectual) Problem Solving: Requires tactile cues;Requires verbal cues;Difficulty sequencing;Slow processing General Comments: Pt following one step commands inconsistently today, able to read name badge and identify objects on command. Pt with periods of clear, calm, and following commands and then period of agitation with poor safety awareness        Exercises     Shoulder Instructions       General Comments      Pertinent Vitals/ Pain       Pain Assessment: Faces Faces Pain Scale: No hurt  Home Living                                          Prior Functioning/Environment              Frequency  Min 3X/week        Progress Toward Goals  OT Goals(current goals can now be found in the care plan section)  Progress towards OT goals: Progressing toward goals  Acute Rehab OT Goals Patient Stated Goal: "get out of here" OT Goal Formulation: With patient  Plan Discharge plan remains appropriate    Co-evaluation    PT/OT/SLP Co-Evaluation/Treatment: Yes Reason for Co-Treatment: Complexity of the patient's impairments (multi-system involvement);Necessary to address cognition/behavior during functional activity;For patient/therapist safety   OT goals addressed during session: ADL's and self-care      AM-PAC PT "6 Clicks" Daily Activity     Outcome Measure   Help from another person eating meals?: Total Help from another person taking care of personal grooming?: A Lot Help from another person toileting, which includes using toliet, bedpan, or urinal?: A  Lot Help from another person bathing (including washing, rinsing, drying)?: A Lot Help from another person to put on and taking off regular upper body clothing?: A Lot Help from another person to put on and taking off regular lower body clothing?: A Lot 6 Click Score: 11    End of Session    OT Visit Diagnosis: Unsteadiness on feet (R26.81);Other abnormalities of gait and mobility (R26.89);Muscle weakness (generalized) (M62.81);Cognitive communication deficit (R41.841)   Activity Tolerance Patient tolerated treatment well   Patient Left in bed;with call bell/phone within reach;with restraints reapplied   Nurse Communication Mobility status        Time: 1601-0932 OT Time Calculation (min): 26 min  Charges: OT General Charges $OT Visit: 1 Visit OT Treatments $Self Care/Home Management : 8-22 mins  Lundynn Cohoon A. Ulice Brilliant, M.S., OTR/L Acute Rehab Department: [phone]   Binnie Kand 01/28/2018, 10:13 AM
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
## 2018-01-28 NOTE — Progress Notes (Signed)
Patient ID: Juan Juarez, male   DOB: 1955/12/11, 62 y.o.   MRN: [id number]    Patient remains encephalopathic but moves all extremities well.  Neurology plans to order MRI scan of his brain

## 2018-01-29 ENCOUNTER — Inpatient Hospital Stay (HOSPITAL_COMMUNITY): Payer: Medicare Other

## 2018-01-29 LAB — URINALYSIS, ROUTINE W REFLEX MICROSCOPIC
Bilirubin Urine: NEGATIVE
Glucose, UA: NEGATIVE mg/dL
HGB URINE DIPSTICK: NEGATIVE
Ketones, ur: NEGATIVE mg/dL
Nitrite: NEGATIVE
PROTEIN: NEGATIVE mg/dL
Specific Gravity, Urine: 1.015 (ref 1.005–1.030)
pH: 5 (ref 5.0–8.0)

## 2018-01-29 LAB — COMPREHENSIVE METABOLIC PANEL
ALBUMIN: 2 g/dL — AB (ref 3.5–5.0)
ALT: 22 U/L (ref 0–44)
ANION GAP: 8 (ref 5–15)
AST: 35 U/L (ref 15–41)
Alkaline Phosphatase: 79 U/L (ref 38–126)
BUN: 10 mg/dL (ref 8–23)
CALCIUM: 7.3 mg/dL — AB (ref 8.9–10.3)
CHLORIDE: 104 mmol/L (ref 98–111)
CO2: 24 mmol/L (ref 22–32)
Creatinine, Ser: 0.66 mg/dL (ref 0.61–1.24)
GFR calc non Af Amer: >60 mL/min (ref >60)
GLUCOSE: 176 mg/dL — AB (ref 70–99)
POTASSIUM: 3.1 mmol/L — AB (ref 3.5–5.1)
SODIUM: 136 mmol/L (ref 135–145)
Total Bilirubin: 0.7 mg/dL (ref 0.3–1.2)
Total Protein: 5.7 g/dL — ABNORMAL LOW (ref 6.5–8.1)

## 2018-01-29 LAB — CBC
HEMATOCRIT: 25.6 % — AB (ref 39.0–52.0)
Hemoglobin: 8.8 g/dL — ABNORMAL LOW (ref 13.0–17.0)
MCH: 34.1 pg — ABNORMAL HIGH (ref 26.0–34.0)
MCHC: 34.4 g/dL (ref 30.0–36.0)
MCV: 99.2 fL (ref 78.0–100.0)
Platelets: 81 10*3/uL — ABNORMAL LOW (ref 150–400)
RBC: 2.58 MIL/uL — ABNORMAL LOW (ref 4.22–5.81)
RDW: 14 % (ref 11.5–15.5)
WBC: 7.6 10*3/uL (ref 4.0–10.5)

## 2018-01-29 LAB — CBC WITH DIFFERENTIAL/PLATELET
ABS IMMATURE GRANULOCYTES: 0 10*3/uL (ref 0.0–0.1)
Basophils Absolute: 0 10*3/uL (ref 0.0–0.1)
Basophils Relative: 0 %
EOS ABS: 0 10*3/uL (ref 0.0–0.7)
Eosinophils Relative: 0 %
HCT: 25.6 % — ABNORMAL LOW (ref 39.0–52.0)
Hemoglobin: 8.7 g/dL — ABNORMAL LOW (ref 13.0–17.0)
IMMATURE GRANULOCYTES: 0 %
LYMPHS PCT: 31 %
Lymphs Abs: 2 10*3/uL (ref 0.7–4.0)
MCH: 34 pg (ref 26.0–34.0)
MCHC: 34 g/dL (ref 30.0–36.0)
MCV: 100 fL (ref 78.0–100.0)
Monocytes Absolute: 0.6 10*3/uL (ref 0.1–1.0)
Monocytes Relative: 10 %
NEUTROS ABS: 3.9 10*3/uL (ref 1.7–7.7)
NEUTROS PCT: 59 %
PLATELETS: 77 10*3/uL — AB (ref 150–400)
RBC: 2.56 MIL/uL — AB (ref 4.22–5.81)
RDW: 14 % (ref 11.5–15.5)
WBC: 6.6 10*3/uL (ref 4.0–10.5)

## 2018-01-29 LAB — PHENYTOIN LEVEL, TOTAL: PHENYTOIN LVL: 13.7 ug/mL (ref 10.0–20.0)

## 2018-01-29 LAB — PHOSPHORUS: PHOSPHORUS: 3.4 mg/dL (ref 2.5–4.6)

## 2018-01-29 LAB — GLUCOSE, CAPILLARY
GLUCOSE-CAPILLARY: 191 mg/dL — AB (ref 70–99)
GLUCOSE-CAPILLARY: 192 mg/dL — AB (ref 70–99)
Glucose-Capillary: 171 mg/dL — ABNORMAL HIGH (ref 70–99)
Glucose-Capillary: 202 mg/dL — ABNORMAL HIGH (ref 70–99)
Glucose-Capillary: 207 mg/dL — ABNORMAL HIGH (ref 70–99)
Glucose-Capillary: 134 mg/dL — ABNORMAL HIGH (ref 70–99)

## 2018-01-29 LAB — PROTIME-INR
INR: 1.58
PROTHROMBIN TIME: 18.8 s — AB (ref 11.4–15.2)

## 2018-01-29 LAB — AMMONIA: Ammonia: 45 umol/L — ABNORMAL HIGH (ref 9–35)

## 2018-01-29 LAB — LIPASE, BLOOD: Lipase: 48 U/L (ref 11–51)

## 2018-01-29 LAB — MAGNESIUM: MAGNESIUM: 1.2 mg/dL — AB (ref 1.7–2.4)

## 2018-01-29 MED ORDER — SODIUM CHLORIDE 0.9% IV SOLUTION: Freq: Once | INTRAVENOUS | Status: DC

## 2018-01-29 MED ORDER — MAGNESIUM SULFATE 50 % IJ SOLN
3.0000 g | Freq: Once | INTRAVENOUS | Status: AC
  Administered 2018-01-29: 3 g via INTRAVENOUS
  Filled 2018-01-29: qty 6

## 2018-01-29 MED ORDER — VANCOMYCIN HCL IN DEXTROSE 750-5 MG/150ML-% IV SOLN
750.0000 mg | Freq: Three times a day (TID) | INTRAVENOUS | Status: DC
  Administered 2018-01-29 – 2018-01-31 (×5): 750 mg via INTRAVENOUS
  Filled 2018-01-29 (×6): qty 150

## 2018-01-29 MED ORDER — PIPERACILLIN-TAZOBACTAM 3.375 G IVPB 30 MIN: 3.3750 g | Freq: Three times a day (TID) | INTRAVENOUS | Status: DC

## 2018-01-29 MED ORDER — VANCOMYCIN HCL 10 G IV SOLR
1250.0000 mg | Freq: Once | INTRAVENOUS | Status: AC
  Administered 2018-01-29: 1250 mg via INTRAVENOUS
  Filled 2018-01-29: qty 1250

## 2018-01-29 MED ORDER — PIPERACILLIN-TAZOBACTAM 3.375 G IVPB
3.3750 g | Freq: Three times a day (TID) | INTRAVENOUS | Status: DC
  Administered 2018-01-29 – 2018-02-02 (×13): 3.375 g via INTRAVENOUS
  Filled 2018-01-29 (×15): qty 50

## 2018-01-29 MED ORDER — LACTULOSE 10 GM/15ML PO SOLN
20.0000 g | Freq: Three times a day (TID) | ORAL | Status: DC
  Administered 2018-01-29 – 2018-02-01 (×7): 20 g
  Filled 2018-01-29 (×9): qty 30

## 2018-01-29 MED ORDER — LORAZEPAM 2 MG/ML IJ SOLN
2.0000 mg | Freq: Once | INTRAMUSCULAR | Status: DC | PRN
  Filled 2018-01-29: qty 1

## 2018-01-29 MED ORDER — PHYTONADIONE 1 MG/0.5 ML ORAL SOLUTION
1.0000 mg | Freq: Once | ORAL | Status: AC
  Administered 2018-01-29: 1 mg via ORAL
  Filled 2018-01-29: qty 0.5

## 2018-01-29 MED ORDER — SODIUM CHLORIDE 0.9 % IV SOLN
INTRAVENOUS | Status: DC
  Administered 2018-01-29 – 2018-02-01 (×2): via INTRAVENOUS

## 2018-01-29 MED ORDER — FUROSEMIDE 10 MG/ML IJ SOLN
20.0000 mg | Freq: Two times a day (BID) | INTRAMUSCULAR | Status: AC
  Administered 2018-01-29 – 2018-01-30 (×4): 20 mg via INTRAVENOUS
  Filled 2018-01-29 (×4): qty 2

## 2018-01-29 MED ORDER — POTASSIUM CHLORIDE 10 MEQ/50ML IV SOLN
10.0000 meq | INTRAVENOUS | Status: AC
  Administered 2018-01-29 (×5): 10 meq via INTRAVENOUS
  Filled 2018-01-29 (×5): qty 50
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
## 2018-01-29 NOTE — Progress Notes (Signed)
Patient had fever spike 101 Will get culture Will start vanco and zosyn Monitor clinically
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
## 2018-01-29 NOTE — Progress Notes (Signed)
Spoke with Dr. Cheral Marker and RN Katie regarding the status of the patient. It was determined the patient is still too confused/altered to have MRI. CT was severely degraded by motion.  If patient progresses we can try again. Pt currently in 5 restraints per RN. Pt will have to remain still or be sedated for MRI in order to obtain partial study.  Notes taken from MRI since 7/29 from various techs..... XXX 7/31 @4PM  Per Dr. Cheral Marker we will not do tonight due to status of pt.... 7/31 @6 :04am, pt Same as yesterday per rn... XXX 7/30 @10 :44am per RN exam on hold check back in morning to see if pt condition has changed... 2/// 7/30 @0324  spoke with RN check back after am rounds with neuro team maybe possibly to be cancelled check back btw [phone] pt will need sedation if the MD'S still want exam done.......7/29 @3 :30pm pt is not going to be able to hold still per RN, please check with RN later tonight to see if he has improved or if the MD wants to sedate him for MRI.
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
## 2018-01-29 NOTE — Progress Notes (Signed)
01/29/18 1100  Clinical Encounter Type  Visited With Patient  Visit Type Follow-up;Spiritual support;Social support  Referral From Nurse  Consult/Referral To Chaplain  Spiritual Encounters  Spiritual Needs Emotional  Stress Factors  Patient Stress Factors Health changes  Chaplain visited with PT who continues to yell and scream continuously.  The PT request was some water and a blanket.  PT stopped yelling for a few minutes but then went back to yelling shortly after getting his requests met.  PT does not open his eyes and PT is also restrained.  He was willing to talk with the Chaplain but used very few sentences, just one word responses.

## 2018-01-29 NOTE — Progress Notes (Signed)
Pharmacy Antibiotic Note  Juan Juarez is a 62 y.o. male admitted on 01/21/2018 with sepsis.  Pharmacy has been consulted for vancomycin and Zosyn dosing. Patient spiked a fever of 101.2 F with increased HR and RR. WBC wnl. SCr 0.66. CrCl ~ 80-85 mL/min.   Plan: Zosyn 3.375 gm IV Q 8 hours (EI infusion) per MD Vancomycin 1250 mg IV once, then start vancomycin 750 mg IV Q 8 hours Monitor CBC, renal fx, cultures and clinical progress VT at SS   Height: 5\' 7"  (170.2 cm) Weight: 151 lb 7.3 oz (68.7 kg) IBW/kg (Calculated) : 66.1  Temp (24hrs), Avg:99.3 F (37.4 C), Min:98.3 F (36.8 C), Max:101.2 F (38.4 C)  Recent Labs  Lab 01/23/18 0345 01/24/18 0325 01/25/18 0155 01/27/18 0240 01/28/18 0500 01/29/18 0503  WBC 8.4 9.5  --  6.0 5.5 6.6  CREATININE 0.84 0.95 0.86 0.74 0.54* 0.66    Estimated Creatinine Clearance: 88.4 mL/min (by C-G formula based on SCr of 0.66 mg/dL).    No Known Allergies  Antimicrobials this admission: Vanc 7/31 >>  Zosyn 7/31 >>   Dose adjustments this admission: None   Microbiology results: 7/31 UCx >>  7/31 BCx >>   Thank you for allowing pharmacy to be a part of this patient's care.  Albertina Parr, PharmD., BCPS Clinical Pharmacist Clinical phone for 01/29/18 until 3:30pm: [phone] If after 3:30pm, please refer to [facility] for unit-specific pharmacist

## 2018-01-30 ENCOUNTER — Inpatient Hospital Stay (HOSPITAL_COMMUNITY): Payer: Medicare Other

## 2018-01-30 DIAGNOSIS — Z4659 Encounter for fitting and adjustment of other gastrointestinal appliance and device: Secondary | ICD-10-CM

## 2018-01-30 LAB — COMPREHENSIVE METABOLIC PANEL
ALK PHOS: 90 U/L (ref 38–126)
ALT: 24 U/L (ref 0–44)
AST: 38 U/L (ref 15–41)
Albumin: 2.1 g/dL — ABNORMAL LOW (ref 3.5–5.0)
Anion gap: 7 (ref 5–15)
BILIRUBIN TOTAL: 1 mg/dL (ref 0.3–1.2)
BUN: 14 mg/dL (ref 8–23)
CALCIUM: 7.8 mg/dL — AB (ref 8.9–10.3)
CO2: 27 mmol/L (ref 22–32)
CREATININE: 0.83 mg/dL (ref 0.61–1.24)
Chloride: 103 mmol/L (ref 98–111)
Glucose, Bld: 104 mg/dL — ABNORMAL HIGH (ref 70–99)
Potassium: 3.6 mmol/L (ref 3.5–5.1)
Sodium: 137 mmol/L (ref 135–145)
TOTAL PROTEIN: 6.6 g/dL (ref 6.5–8.1)

## 2018-01-30 LAB — CBC WITH DIFFERENTIAL/PLATELET
ABS IMMATURE GRANULOCYTES: 0 10*3/uL (ref 0.0–0.1)
BASOS PCT: 0 %
Basophils Absolute: 0 10*3/uL (ref 0.0–0.1)
Eosinophils Absolute: 0.1 10*3/uL (ref 0.0–0.7)
Eosinophils Relative: 1 %
HCT: 29.8 % — ABNORMAL LOW (ref 39.0–52.0)
HEMOGLOBIN: 10.1 g/dL — AB (ref 13.0–17.0)
Immature Granulocytes: 1 %
Lymphocytes Relative: 23 %
Lymphs Abs: 1.9 10*3/uL (ref 0.7–4.0)
MCH: 34 pg (ref 26.0–34.0)
MCHC: 33.9 g/dL (ref 30.0–36.0)
MCV: 100.3 fL — AB (ref 78.0–100.0)
MONO ABS: 0.7 10*3/uL (ref 0.1–1.0)
MONOS PCT: 9 %
NEUTROS ABS: 5.7 10*3/uL (ref 1.7–7.7)
Neutrophils Relative %: 66 %
PLATELETS: 91 10*3/uL — AB (ref 150–400)
RBC: 2.97 MIL/uL — ABNORMAL LOW (ref 4.22–5.81)
RDW: 14.4 % (ref 11.5–15.5)
WBC: 8.5 10*3/uL (ref 4.0–10.5)

## 2018-01-30 LAB — PREPARE PLATELET PHERESIS: Unit division: 0

## 2018-01-30 LAB — AMMONIA: Ammonia: 48 umol/L — ABNORMAL HIGH (ref 9–35)

## 2018-01-30 LAB — PHENYTOIN LEVEL, TOTAL: Phenytoin Lvl: 12.1 ug/mL (ref 10.0–20.0)

## 2018-01-30 LAB — GLUCOSE, CAPILLARY
GLUCOSE-CAPILLARY: 230 mg/dL — AB (ref 70–99)
Glucose-Capillary: 145 mg/dL — ABNORMAL HIGH (ref 70–99)
Glucose-Capillary: 273 mg/dL — ABNORMAL HIGH (ref 70–99)

## 2018-01-30 LAB — BPAM PLATELET PHERESIS
BLOOD PRODUCT EXPIRATION DATE: 201907312359
ISSUE DATE / TIME: 201907311245
UNIT TYPE AND RH: 600

## 2018-01-30 LAB — PHOSPHORUS: PHOSPHORUS: 3.4 mg/dL (ref 2.5–4.6)

## 2018-01-30 LAB — PROTIME-INR
INR: 1.34
Prothrombin Time: 16.4 seconds — ABNORMAL HIGH (ref 11.4–15.2)

## 2018-01-30 LAB — MAGNESIUM: MAGNESIUM: 1.5 mg/dL — AB (ref 1.7–2.4)

## 2018-01-30 MED ORDER — MAGNESIUM SULFATE 2 GM/50ML IV SOLN
2.0000 g | Freq: Once | INTRAVENOUS | Status: AC
  Administered 2018-01-30: 2 g via INTRAVENOUS
  Filled 2018-01-30: qty 50
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
## 2018-01-30 NOTE — Progress Notes (Signed)
Inpatient Rehabilitation Admissions Coordinator  Patient with continued improvement with cognition today. Worked well with therapy. Precedex weaning. Eating well. I asked RN, Lattie Haw, to pursue an inpt rehab consult from Attending MD at this time to assess rehab venue options.  Danne Baxter, RN, MSN Rehab Admissions Coordinator [phone] 01/30/2018 11:20 AM

## 2018-01-30 NOTE — Progress Notes (Addendum)
NEUROSURGERY PROGRESS NOTE  Patient continues to remain stable, MRI has not been completed yet. No new nsgy recom at this time.   Temp:  [98.4 F (36.9 C)-101.7 F (38.7 C)] 98.4 F (36.9 C) (08/01 0400) Pulse Rate:  [64-117] 65 (08/01 0718) Resp:  [14-28] 28 (08/01 0718) BP: (82-154)/(53-88) 99/71 (08/01 0718) SpO2:  [95 %-100 %] 98 % (08/01 0718) Weight:  [74.5 kg (164 lb 3.9 oz)] 74.5 kg (164 lb 3.9 oz) (08/01 0500)    Eleonore Chiquito, NP 01/30/2018 7:40 AM  Agree with above

## 2018-01-30 NOTE — Progress Notes (Signed)
Physical Therapy Treatment Patient Details Name: Juan Juarez MRN: [id number] DOB: [birth year] Today's Date: 01/30/2018    History of Present Illness 62 y.o. male with a history of hepatic cirrhosis, hepatitis C, polysubstance abuse, IDDM2, and HTN who was admitted 01/21/18 with seizure. He had been admitted 3 weeks prior to this admission with SDH.  Repeat CT head showed stable right SDH with 65mm R>L midline shift.  Neurosurgery performed SDH evacuation with bur holes x2 on 01/22/18.      PT Comments    Pt was able to participate in co session with PT/OT emerging goal directed behaviors and improving cognition.  We walked a short distance around the room with two person assist for safety and line management.  He is not yet safe enough to be up in the chair unsupervised, but soon may be.    Follow Up Recommendations  CIR     Equipment Recommendations  None recommended by PT    Recommendations for Other Services   NA     Precautions / Restrictions Precautions Precautions: Fall;Other (comment) Precaution Comments: restraints: just arms and legs today, no mittens or posey    Mobility  Bed Mobility Overal bed mobility: Needs Assistance       Supine to sit: Min assist;+2 for safety/equipment Sit to supine: Mod assist;+2 for safety/equipment   General bed mobility comments: Min hand held assist to come to sitting and mod assist to help lift both legs back into bed.  Second person helpful both for line management and safety as pt is quick to move/impulsive with decreased safety awareness.   Transfers Overall transfer level: Needs assistance Equipment used: 2 person hand held assist Transfers: Sit to/from Stand Sit to Stand: +2 physical assistance;Min assist;+2 safety/equipment         General transfer comment: Two person heavy min assist to stand at EOB and from recliner chair.  Assit to support trunk to power up and to anteriorly translate trunk (a bit of an initial posterior  leaner), once up, pt frustrated with the amount of assist and can get down to one person hand held assist with second close by for safety.   Ambulation/Gait Ambulation/Gait assistance: Mod assist;Min assist;+2 safety/equipment Gait Distance (Feet): 15 Feet Assistive device: 1 person hand held assist Gait Pattern/deviations: Step-through pattern;Staggering left;Staggering right;Leaning posteriorly     General Gait Details: Pt with min mod hand held assist during gait, again asking therapists to get out of his way, unaware of his balance deficits, assist needed for balance and second person very close for safey and line management.        Modified Rankin (Stroke Patients Only) Modified Rankin (Stroke Patients Only) Pre-Morbid Rankin Score: No symptoms Modified Rankin: Moderately severe disability     Balance Overall balance assessment: Needs assistance Sitting-balance support: Feet supported;Bilateral upper extremity supported;No upper extremity supported Sitting balance-Leahy Scale: Poor Sitting balance - Comments: More assist to ensure he doesn't come up off of the bed before therapists are ready   Standing balance support: Bilateral upper extremity supported;No upper extremity supported;Single extremity supported Standing balance-Leahy Scale: Poor Standing balance comment: assist needed for balance in standing.                             Cognition Arousal/Alertness: Awake/alert Behavior During Therapy: Restless;Impulsive Overall Cognitive Status: Impaired/Different from baseline Area of Impairment: Orientation;Attention;Memory;Following commands;Safety/judgement;Awareness;Problem solving  Orientation Level: Disoriented to;Time;Situation;Place(he reported [facility] (but at least he knew hospit) Current Attention Level: Sustained Memory: Decreased recall of precautions;Decreased short-term memory Following Commands: Follows one step  commands consistently Safety/Judgement: Decreased awareness of safety;Decreased awareness of deficits Awareness: Intellectual Problem Solving: Difficulty sequencing;Requires verbal cues;Requires tactile cues General Comments: Pt is quick to move and has no awareness of his deficits, he consistantly is telling therapists to "move out of my way" despite balance deficits and significant number of lines,  he knows he is at a hosptial despite naming the wrong hospital and he had some awareness of the holes in head.               Pertinent Vitals/Pain Pain Assessment: No/denies pain           PT Goals (current goals can now be found in the care plan section) Acute Rehab PT Goals Patient Stated Goal: to get OOB Progress towards PT goals: Progressing toward goals    Frequency    Min 3X/week      PT Plan Current plan remains appropriate    Co-evaluation PT/OT/SLP Co-Evaluation/Treatment: Yes Reason for Co-Treatment: Complexity of the patient's impairments (multi-system involvement);Necessary to address cognition/behavior during functional activity;To address functional/ADL transfers;For patient/therapist safety PT goals addressed during session: Mobility/safety with mobility;Balance        AM-PAC PT "6 Clicks" Daily Activity  Outcome Measure  Difficulty turning over in bed (including adjusting bedclothes, sheets and blankets)?: A Little Difficulty moving from lying on back to sitting on the side of the bed? : Unable Difficulty sitting down on and standing up from a chair with arms (e.g., wheelchair, bedside commode, etc,.)?: Unable Help needed moving to and from a bed to chair (including a wheelchair)?: A Little Help needed walking in hospital room?: A Lot Help needed climbing 3-5 steps with a railing? : A Lot 6 Click Score: 12    End of Session Equipment Utilized During Treatment: Gait belt Activity Tolerance: Patient tolerated treatment well Patient left: in bed;with  call bell/phone within reach;with bed alarm set;with restraints reapplied   PT Visit Diagnosis: Difficulty in walking, not elsewhere classified (R26.2);Other symptoms and signs involving the nervous system (G01.749)     Time: 4496-7591 PT Time Calculation (min) (ACUTE ONLY): 30 min  Charges:  $Therapeutic Activity: 8-22 mins          Gracyn Santillanes B. Haiden Rawlinson, PT, DPT [phone]             01/30/2018, 11:35 AM

## 2018-01-30 NOTE — Progress Notes (Signed)
Occupational Therapy Treatment Patient Details Name: Juan Juarez MRN: [id number] DOB: [birth year] Today's Date: 01/30/2018    History of present illness 62 y.o. male with a history of hepatic cirrhosis, hepatitis C, polysubstance abuse, IDDM2, and HTN who was admitted 01/21/18 with seizure. He had been admitted 3 weeks prior to this admission with SDH.  Repeat CT head showed stable right SDH with 74mm R>L midline shift.  Neurosurgery performed SDH evacuation with bur holes x2 on 01/22/18.     OT comments  Pt progressing towards OT goals this session, improving cognition and behaviors. Pt was able to identify his personal goal, and carry out task moving around room with heavy min A +2 for mobility and line management. Max A for LB dressing this session. CIR remains appropriate and essential for safety and to maximize independence in ADL.    Follow Up Recommendations  CIR;Supervision/Assistance - 24 hour    Equipment Recommendations  Other (comment)(defer to next venue)    Recommendations for Other Services      Precautions / Restrictions Precautions Precautions: Fall;Other (comment) Precaution Comments: restraints: just arms and legs today, no mittens or posey Restrictions Weight Bearing Restrictions: No       Mobility Bed Mobility Overal bed mobility: Needs Assistance Bed Mobility: Supine to Sit;Sit to Supine     Supine to sit: Min assist;+2 for safety/equipment Sit to supine: Mod assist;+2 for safety/equipment   General bed mobility comments: Min hand held assist to come to sitting and mod assist to help lift both legs back into bed.  Second person helpful both for line management and safety as pt is quick to move/impulsive with decreased safety awareness.   Transfers Overall transfer level: Needs assistance Equipment used: 2 person hand held assist Transfers: Sit to/from Stand Sit to Stand: +2 physical assistance;Min assist;+2 safety/equipment         General transfer  comment: Two person heavy min assist to stand at EOB and from recliner chair.  Assit to support trunk to power up and to anteriorly translate trunk (a bit of an initial posterior leaner), once up, pt frustrated with the amount of assist and can get down to one person hand held assist with second close by for safety.     Balance Overall balance assessment: Needs assistance Sitting-balance support: Feet supported;Bilateral upper extremity supported;No upper extremity supported Sitting balance-Leahy Scale: Poor Sitting balance - Comments: More assist to ensure he doesn't come up off of the bed before therapists are ready   Standing balance support: Bilateral upper extremity supported;No upper extremity supported;Single extremity supported Standing balance-Leahy Scale: Poor Standing balance comment: assist needed for balance in standing.                            ADL either performed or assessed with clinical judgement   ADL Overall ADL's : Needs assistance/impaired Eating/Feeding: Set up;Sitting     Grooming Details (indicate cue type and reason): declined any and all offers for grooming activities at sink             Lower Body Dressing: Maximal assistance;Bed level Lower Body Dressing Details (indicate cue type and reason): followed cues to lift BLE to assist with donning socks Toilet Transfer: Minimal assistance;+2 for physical assistance;+2 for safety/equipment;Ambulation   Toileting- Clothing Manipulation and Hygiene: Total assistance Toileting - Clothing Manipulation Details (indicate cue type and reason): Pt currently with condom catheter and flexi seal     Functional mobility during  ADLs: Minimal assistance;+2 for physical assistance;+2 for safety/equipment;Cueing for safety General ADL Comments: Pt with definite opinions on what he did and did not want to do, but with reasoning behind why he did not want to do them     Vision       Perception     Praxis       Cognition Arousal/Alertness: Awake/alert Behavior During Therapy: Restless;Impulsive Overall Cognitive Status: Impaired/Different from baseline Area of Impairment: Orientation;Attention;Memory;Following commands;Safety/judgement;Awareness;Problem solving                 Orientation Level: Disoriented to;Time;Situation;Place(he reported [name] (but at least he knew hospital)) Current Attention Level: Sustained Memory: Decreased recall of precautions;Decreased short-term memory Following Commands: Follows one step commands consistently Safety/Judgement: Decreased awareness of safety;Decreased awareness of deficits Awareness: Intellectual Problem Solving: Difficulty sequencing;Requires verbal cues;Requires tactile cues General Comments: Pt is quick to move and has no awareness of his deficits, he consistantly is telling therapists to "move out of my way" despite balance deficits and significant number of lines,  he knows he is at a hosptial despite naming the wrong hospital and he had some awareness of the holes in head.          Exercises     Shoulder Instructions       General Comments      Pertinent Vitals/ Pain       Pain Assessment: No/denies pain  Home Living                                          Prior Functioning/Environment              Frequency  Min 3X/week        Progress Toward Goals  OT Goals(current goals can now be found in the care plan section)  Progress towards OT goals: Progressing toward goals  Acute Rehab OT Goals Patient Stated Goal: to get OOB OT Goal Formulation: With patient Time For Goal Achievement: 02/09/18 Potential to Achieve Goals: Good  Plan Discharge plan remains appropriate    Co-evaluation    PT/OT/SLP Co-Evaluation/Treatment: Yes Reason for Co-Treatment: Complexity of the patient's impairments (multi-system involvement);Necessary to address cognition/behavior during functional  activity;For patient/therapist safety;To address functional/ADL transfers PT goals addressed during session: Mobility/safety with mobility;Balance OT goals addressed during session: ADL's and self-care;Strengthening/ROM      AM-PAC PT "6 Clicks" Daily Activity     Outcome Measure   Help from another person eating meals?: A Little Help from another person taking care of personal grooming?: A Little Help from another person toileting, which includes using toliet, bedpan, or urinal?: A Lot Help from another person bathing (including washing, rinsing, drying)?: A Little Help from another person to put on and taking off regular upper body clothing?: A Little Help from another person to put on and taking off regular lower body clothing?: A Lot 6 Click Score: 16    End of Session Equipment Utilized During Treatment: Gait belt  OT Visit Diagnosis: Unsteadiness on feet (R26.81);Other abnormalities of gait and mobility (R26.89);Muscle weakness (generalized) (M62.81);Cognitive communication deficit (R41.841)   Activity Tolerance Patient tolerated treatment well   Patient Left in bed;with call bell/phone within reach;with bed alarm set;with restraints reapplied   Nurse Communication Mobility status        Time: 9169-4503 OT Time Calculation (min): 29 min  Charges: OT General Charges $OT Visit:  1 Visit OT Treatments $Therapeutic Activity: 8-22 mins  Hulda Humphrey OTR/L [place] 01/30/2018, 11:59 AM
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
## 2018-01-31 ENCOUNTER — Other Ambulatory Visit: Payer: Self-pay

## 2018-01-31 DIAGNOSIS — S069X3S Unspecified intracranial injury with loss of consciousness of 1 hour to 5 hours 59 minutes, sequela: Secondary | ICD-10-CM

## 2018-01-31 LAB — CBC WITH DIFFERENTIAL/PLATELET
Abs Immature Granulocytes: 0.1 10*3/uL (ref 0.0–0.1)
BASOS ABS: 0 10*3/uL (ref 0.0–0.1)
BASOS PCT: 0 %
EOS ABS: 0 10*3/uL (ref 0.0–0.7)
EOS PCT: 1 %
HCT: 27.9 % — ABNORMAL LOW (ref 39.0–52.0)
Hemoglobin: 9.5 g/dL — ABNORMAL LOW (ref 13.0–17.0)
Immature Granulocytes: 0 %
Lymphocytes Relative: 20 %
Lymphs Abs: 1.6 10*3/uL (ref 0.7–4.0)
MCH: 33.5 pg (ref 26.0–34.0)
MCHC: 34.1 g/dL (ref 30.0–36.0)
MCV: 98.2 fL (ref 78.0–100.0)
MONO ABS: 0.7 10*3/uL (ref 0.1–1.0)
Monocytes Relative: 9 %
Neutro Abs: 5.6 10*3/uL (ref 1.7–7.7)
Neutrophils Relative %: 70 %
Platelets: 87 10*3/uL — ABNORMAL LOW (ref 150–400)
RBC: 2.84 MIL/uL — AB (ref 4.22–5.81)
RDW: 14.1 % (ref 11.5–15.5)
WBC: 8 10*3/uL (ref 4.0–10.5)

## 2018-01-31 LAB — AMMONIA: AMMONIA: 39 umol/L — AB (ref 9–35)

## 2018-01-31 LAB — BASIC METABOLIC PANEL
Anion gap: 10 (ref 5–15)
BUN: 12 mg/dL (ref 8–23)
CALCIUM: 7.4 mg/dL — AB (ref 8.9–10.3)
CO2: 25 mmol/L (ref 22–32)
CREATININE: 0.9 mg/dL (ref 0.61–1.24)
Chloride: 97 mmol/L — ABNORMAL LOW (ref 98–111)
GFR calc Af Amer: >60 mL/min (ref >60)
Glucose, Bld: 341 mg/dL — ABNORMAL HIGH (ref 70–99)
POTASSIUM: 3.7 mmol/L (ref 3.5–5.1)
SODIUM: 132 mmol/L — AB (ref 135–145)

## 2018-01-31 LAB — PROTIME-INR
INR: 1.37
Prothrombin Time: 16.8 seconds — ABNORMAL HIGH (ref 11.4–15.2)

## 2018-01-31 LAB — URINE CULTURE: Culture: >=100000 — AB

## 2018-01-31 LAB — MAGNESIUM: MAGNESIUM: 1.6 mg/dL — AB (ref 1.7–2.4)

## 2018-01-31 LAB — GLUCOSE, CAPILLARY
GLUCOSE-CAPILLARY: 57 mg/dL — AB (ref 70–99)
GLUCOSE-CAPILLARY: 149 mg/dL — AB (ref 70–99)
Glucose-Capillary: 226 mg/dL — ABNORMAL HIGH (ref 70–99)
Glucose-Capillary: 231 mg/dL — ABNORMAL HIGH (ref 70–99)
Glucose-Capillary: 249 mg/dL — ABNORMAL HIGH (ref 70–99)

## 2018-01-31 LAB — PHOSPHORUS: Phosphorus: 2.5 mg/dL (ref 2.5–4.6)

## 2018-01-31 MED ORDER — INSULIN ASPART 100 UNIT/ML ~~LOC~~ SOLN
0.0000 [IU] | Freq: Three times a day (TID) | SUBCUTANEOUS | Status: DC
  Administered 2018-01-31: 3 [IU] via SUBCUTANEOUS
  Administered 2018-01-31 (×2): 5 [IU] via SUBCUTANEOUS
  Administered 2018-02-01: 3 [IU] via SUBCUTANEOUS
  Administered 2018-02-01: 1 [IU] via SUBCUTANEOUS
  Administered 2018-02-01 – 2018-02-02 (×2): 5 [IU] via SUBCUTANEOUS
  Administered 2018-02-02: 2 [IU] via SUBCUTANEOUS
  Administered 2018-02-02: 5 [IU] via SUBCUTANEOUS
  Administered 2018-02-03: 2 [IU] via SUBCUTANEOUS
  Administered 2018-02-03: 7 [IU] via SUBCUTANEOUS

## 2018-01-31 NOTE — Progress Notes (Signed)
NEURO HOSPITALIST PROGRESS NOTE   Subjective: Patient awake, alert sitting up in bed, NAD. Tells me that he was told that he had a seizure, but he doesn't remember anything about his last seizure. Says he had a seizure where his leg was twitching, but his girlfriend thought he was just playing and he did not know what happened.   Exam: Vitals:   01/31/18 0800 01/31/18 0833  BP: 118/75   Pulse: 72   Resp: 20   Temp:  98.8 F (37.1 C)  SpO2: 100%     Physical Exam   HEENT-  Normocephalic with head shaved on right side. 2 surgical sites present with edges approximated with staples. Normal external eye and conjunctiva.   Cardiovascular- S1-S2 audible, pulses palpable throughout   Lungs-no rhonchi or wheezing noted, no excessive working breathing.  Saturations within normal limits on RA Abdomen- BS present x 4 quadrants Extremities- Warm, dry and intact Musculoskeletal-no joint tenderness, deformity or swelling Skin-warm and dry, surgical incisions present on head.    Neuro:  Mental Status: Alert, oriented,to name/age/ year/month/ place/ situation.  Speech fluent without evidence of aphasia.  Able to follow commands without difficulty. precedex not currently running.  Cranial Nerves: II: Visual fields grossly normal,  III,IV, VI: ptosis not present, extra-ocular motions intact bilaterally pupils equal, round, reactive to light and accommodation V,VII: smile symmetric, facial light touch sensation normal bilaterally VIII: hearing normal bilaterally IX,X: uvula rises symmetrically XI: bilateral shoulder shrug XII: midline tongue extension Motor: Right : Upper extremity   5/5    Left:     Upper extremity   5/5  Lower extremity   4/5     Lower extremity   4/5 Tone and bulk:normal tone throughout; no atrophy noted Sensory:  light touch intact throughout, bilaterally Deep Tendon Reflexes: 2+ and symmetric biceps, patella Plantars: Right: downgoing   Left:  downgoing Cerebellar: normal finger-to-nose, normal rapid alternating movements and normal heel-to-shin test Gait: Deferred    Medications:  Scheduled: . chlorhexidine  15 mL Mouth Rinse BID  . Chlorhexidine Gluconate Cloth  6 each Topical Daily  . folic acid  1 mg Per Tube Daily  . insulin aspart  0-9 Units Subcutaneous TID WC  . lactulose  20 g Per Tube TID  . mouth rinse  15 mL Mouth Rinse q12n4p  . multivitamin with minerals  1 tablet Per Tube Daily  . OLANZapine  5 mg Oral Daily  . sodium chloride flush  10-40 mL Intracatheter Q12H  . thiamine  100 mg Per Tube Daily   Or  . thiamine  100 mg Intravenous Daily   Continuous: . sodium chloride Stopped (01/31/18 0534)  . sodium chloride 10 mL/hr at 01/29/18 1008  . dexmedetomidine (PRECEDEX) IV infusion Stopped (01/31/18 0730)  . famotidine (PEPCID) IV Stopped (01/30/18 2304)  . levETIRAcetam Stopped (01/31/18 1751)  . piperacillin-tazobactam (ZOSYN)  IV 3.375 g (01/31/18 0534)  . vancomycin 750 mg (01/31/18 0615)   [facility]:ENIDPOEUMPNTI **OR** acetaminophen, hydrALAZINE, labetalol, LORazepam, morphine injection, ondansetron **OR** ondansetron (ZOFRAN) IV, sodium chloride flush  Pertinent Labs/Diagnostics:   Dg Chest Port 1 View  Result Date: 01/30/2018 CLINICAL DATA:  Shortness of Breath EXAM: PORTABLE CHEST 1 VIEW COMPARISON:  01/29/2018 FINDINGS: Cardiac shadow is stable. Feeding catheter and right-sided PICC line are again seen and stable. Lungs are well aerated with some mild persistent basilar changes stable from  the prior exam. No new focal infiltrate is seen. No bony abnormality is noted. IMPRESSION: Stable bibasilar changes when compared with the prior exam. Tubes and lines as described. Electronically Signed   By: Inez Catalina M.D.   On: 01/30/2018 09:17    Impression: 1. Delirium, now resolved.    2.Status epilepticus, resolved.  3. Right subdural hemorrhage with midline shift status postcraniotomy 4. History of  polysubstance abuse. Cocaine positive on admission 5. Liver cirrhosis secondary to hepatitis C  Recommendations: --Continue Keppraat 1000 mg BID. --Pharmacy to dose Dilantin --Continue thiamine supplementation.  --Continue lactulose --Continue CIWA protocol --Neurology will sign off. Please call if there are additional questions.   Laurey Morale, MSN, NP-C [facility] [phone]  Electronically signed: Dr. Kerney Elbe 01/31/2018, 9:35 AM

## 2018-01-31 NOTE — Progress Notes (Signed)
Patient's blood sugar low at 57. Hypoglycemic protocol initiated. Will recheck blood sugar in an hour.
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
## 2018-01-31 NOTE — Plan of Care (Signed)
Pr ambulated around the unit (about 254m) today; sat up in chair for greater than 4 hours today

## 2018-01-31 NOTE — Progress Notes (Signed)
Physical Therapy Treatment Patient Details Name: Juan Juarez MRN: [id number] DOB: 09-Nov-1955 Today's Date: 01/31/2018    History of Present Illness 62 y.o. male with a history of hepatic cirrhosis, hepatitis C, polysubstance abuse, IDDM2, and HTN who was admitted 01/21/18 with seizure. He had been admitted 3 weeks prior to this admission with SDH.  Repeat CT head showed stable right SDH with 31mm R>L midline shift.  Neurosurgery performed SDH evacuation with bur holes x2 on 01/22/18.      PT Comments    Patient progressing with ambulation with one assist and walker this session in hallway.  Remains confused with decreased deficit awareness and high risk for falls.  Noted rehab coordinator note so pt will need SNF level rehab at d/c as no family can safely provide 24 hour assist at d/c.    Follow Up Recommendations  SNF;Supervision/Assistance - 24 hour     Equipment Recommendations  Other (comment)(tba)    Recommendations for Other Services       Precautions / Restrictions Precautions Precautions: Fall Precaution Comments: flexiseal    Mobility  Bed Mobility Overal bed mobility: Needs Assistance Bed Mobility: Supine to Sit     Supine to sit: Min assist     General bed mobility comments: cues for laterality of the bed to come up; assist for safety/lines  Transfers Overall transfer level: Needs assistance Equipment used: Rolling walker (2 wheeled);1 person hand held assist Transfers: Sit to/from [facility] Sit to Stand: Mod assist Stand pivot transfers: Mod assist       General transfer comment: initially pt just s/p PICC line removal and dressing reinforcement so assisted RN to get pt to chair and then returned later to assist with ambulation   Ambulation/Gait Ambulation/Gait assistance: Mod assist Gait Distance (Feet): 150 Feet Assistive device: Rolling walker (2 wheeled) Gait Pattern/deviations: Step-to pattern;Step-through pattern;Decreased stride  length;Wide base of support;Drifts right/left;Trunk flexed;Ataxic     General Gait Details: difficulty initially keeping L foot in walker, and assist for L turns throughout with running into doorfacings and walls on L side and x1 on R side with assist for walker safety and cues for balance, L foot in walker, etc.      Stairs             Wheelchair Mobility    Modified Rankin (Stroke Patients Only)       Balance Overall balance assessment: Needs assistance Sitting-balance support: Feet supported Sitting balance-Leahy Scale: Fair Sitting balance - Comments: tends to lean down and to L in sitting and looks up with his head cocked to L, but no LOB   Standing balance support: Bilateral upper extremity supported;No upper extremity supported;During functional activity Standing balance-Leahy Scale: Poor Standing balance comment: standing at sink for extended time to brush teeth and perseverates on feeling of something under L side of his upper lip. minguard for balance and to avoid hitting head on paper towel dispenser                            Cognition Arousal/Alertness: Awake/alert Behavior During Therapy: Restless;Impulsive Overall Cognitive Status: Impaired/Different from baseline Area of Impairment: Orientation;Attention;Problem solving;Memory;Safety/judgement;Following commands               Rancho Levels of Cognitive Functioning Rancho Los Amigos Scales of Cognitive Functioning: Confused/inappropriate/non-agitated Orientation Level: Disoriented to;Situation;Time Current Attention Level: Sustained Memory: Decreased short-term memory;Decreased recall of precautions Following Commands: Follows one step commands with increased time  Safety/Judgement: Decreased awareness of safety;Decreased awareness of deficits   Problem Solving: Difficulty sequencing;Requires verbal cues;Slow processing        Exercises      General Comments General comments (skin  integrity, edema, etc.): patient needs much cueing and encouragement to sit as he is unaware of deficits despite stating he felt he was "off" and desiring to go to bathroom on his own; also assisted to towel hair to try to get glue out of his hair with comb per his request      Pertinent Vitals/Pain Pain Assessment: No/denies pain    Home Living                      Prior Function            PT Goals (current goals can now be found in the care plan section) Progress towards PT goals: Progressing toward goals    Frequency    Min 3X/week      PT Plan Discharge plan needs to be updated    Co-evaluation              AM-PAC PT "6 Clicks" Daily Activity  Outcome Measure  Difficulty turning over in bed (including adjusting bedclothes, sheets and blankets)?: A Little Difficulty moving from lying on back to sitting on the side of the bed? : Unable Difficulty sitting down on and standing up from a chair with arms (e.g., wheelchair, bedside commode, etc,.)?: Unable Help needed moving to and from a bed to chair (including a wheelchair)?: A Little Help needed walking in hospital room?: A Little Help needed climbing 3-5 steps with a railing? : A Lot 6 Click Score: 13    End of Session Equipment Utilized During Treatment: Gait belt Activity Tolerance: Patient tolerated treatment well Patient left: in chair;with chair alarm set Nurse Communication: Mobility status PT Visit Diagnosis: Difficulty in walking, not elsewhere classified (R26.2);Other symptoms and signs involving the nervous system (R29.898)     Time: 1400-1446 PT Time Calculation (min) (ACUTE ONLY): 46 min  Charges:  $Gait Training: 8-22 mins $Therapeutic Activity: 23-37 mins                     [place], [place] [phone] 01/31/2018    Reginia Naas 01/31/2018, 4:05 PM

## 2018-01-31 NOTE — Progress Notes (Signed)
Patient ID: Juan Juarez, male   DOB: 08/17/1955, 62 y.o.   MRN: [id number] Patient with significant improvement mental status.  Awake alert converses moves all extremities well awaiting rehab no new neurosurgical recommendations.

## 2018-02-01 LAB — GLUCOSE, CAPILLARY
GLUCOSE-CAPILLARY: 140 mg/dL — AB (ref 70–99)
Glucose-Capillary: 289 mg/dL — ABNORMAL HIGH (ref 70–99)

## 2018-02-01 LAB — PHENYTOIN LEVEL, TOTAL: Phenytoin Lvl: 3.6 ug/mL — ABNORMAL LOW (ref 10.0–20.0)

## 2018-02-01 MED ORDER — SODIUM CHLORIDE 0.9 % IV SOLN
300.0000 mg | Freq: Once | INTRAVENOUS | Status: AC
  Administered 2018-02-01: 300 mg via INTRAVENOUS
  Filled 2018-02-01: qty 6

## 2018-02-01 MED ORDER — PHENYTOIN SODIUM 50 MG/ML IJ SOLN
100.0000 mg | Freq: Two times a day (BID) | INTRAMUSCULAR | Status: DC
  Administered 2018-02-02: 100 mg via INTRAVENOUS
  Filled 2018-02-01: qty 2

## 2018-02-01 NOTE — Plan of Care (Signed)
Pt ate greater than 75% of all 3 of his meals today
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
## 2018-02-02 DIAGNOSIS — E7220 Disorder of urea cycle metabolism, unspecified: Secondary | ICD-10-CM

## 2018-02-02 DIAGNOSIS — K7030 Alcoholic cirrhosis of liver without ascites: Secondary | ICD-10-CM

## 2018-02-02 DIAGNOSIS — F1910 Other psychoactive substance abuse, uncomplicated: Secondary | ICD-10-CM

## 2018-02-02 LAB — GLUCOSE, CAPILLARY
GLUCOSE-CAPILLARY: 334 mg/dL — AB (ref 70–99)
GLUCOSE-CAPILLARY: 219 mg/dL — AB (ref 70–99)
GLUCOSE-CAPILLARY: 251 mg/dL — AB (ref 70–99)
GLUCOSE-CAPILLARY: 267 mg/dL — AB (ref 70–99)
GLUCOSE-CAPILLARY: 193 mg/dL — AB (ref 70–99)
Glucose-Capillary: 286 mg/dL — ABNORMAL HIGH (ref 70–99)
Glucose-Capillary: 205 mg/dL — ABNORMAL HIGH (ref 70–99)
Glucose-Capillary: 267 mg/dL — ABNORMAL HIGH (ref 70–99)
Glucose-Capillary: 266 mg/dL — ABNORMAL HIGH (ref 70–99)
Glucose-Capillary: 167 mg/dL — ABNORMAL HIGH (ref 70–99)
Glucose-Capillary: 256 mg/dL — ABNORMAL HIGH (ref 70–99)
Glucose-Capillary: 242 mg/dL — ABNORMAL HIGH (ref 70–99)

## 2018-02-02 LAB — TYPE AND SCREEN
ABO/RH(D): A POS
Antibody Screen: POSITIVE
DAT, IgG: NEGATIVE
PT AG Type: NEGATIVE
UNIT DIVISION: 0
Unit division: 0

## 2018-02-02 LAB — BPAM RBC
BLOOD PRODUCT EXPIRATION DATE: 201908152359
BLOOD PRODUCT EXPIRATION DATE: 201908212359
UNIT TYPE AND RH: 6200
Unit Type and Rh: 6200

## 2018-02-02 MED ORDER — LACTULOSE 10 GM/15ML PO SOLN: 20.0000 g | Freq: Three times a day (TID) | ORAL | Status: DC

## 2018-02-02 MED ORDER — CEPHALEXIN 500 MG PO CAPS
500.0000 mg | ORAL_CAPSULE | Freq: Two times a day (BID) | ORAL | Status: DC
  Administered 2018-02-02 – 2018-02-03 (×2): 500 mg via ORAL
  Filled 2018-02-02 (×2): qty 1

## 2018-02-02 MED ORDER — ALPRAZOLAM 0.5 MG PO TABS
0.5000 mg | ORAL_TABLET | Freq: Every evening | ORAL | Status: DC | PRN
Start: 2018-02-02 — End: 2018-02-03
  Administered 2018-02-02 (×2): 0.5 mg via ORAL
  Filled 2018-02-02 (×3): qty 1

## 2018-02-02 MED ORDER — ADULT MULTIVITAMIN W/MINERALS CH
1.0000 | ORAL_TABLET | Freq: Every day | ORAL | Status: DC
  Administered 2018-02-03: 1 via ORAL
  Filled 2018-02-02: qty 1

## 2018-02-02 MED ORDER — THIAMINE HCL 100 MG/ML IJ SOLN: 100.0000 mg | Freq: Every day | INTRAMUSCULAR | Status: DC

## 2018-02-02 MED ORDER — PHENYTOIN 50 MG PO CHEW
100.0000 mg | CHEWABLE_TABLET | Freq: Two times a day (BID) | ORAL | Status: DC
  Administered 2018-02-02 – 2018-02-03 (×2): 100 mg via ORAL
  Filled 2018-02-02 (×3): qty 2

## 2018-02-02 MED ORDER — LEVETIRACETAM 500 MG PO TABS
1000.0000 mg | ORAL_TABLET | Freq: Two times a day (BID) | ORAL | Status: DC
  Administered 2018-02-02 – 2018-02-03 (×2): 1000 mg via ORAL
  Filled 2018-02-02 (×2): qty 2

## 2018-02-02 MED ORDER — MORPHINE SULFATE (PF) 2 MG/ML IV SOLN
1.0000 mg | INTRAVENOUS | Status: DC | PRN
  Administered 2018-02-02: 1 mg via INTRAVENOUS
  Filled 2018-02-02: qty 1

## 2018-02-02 MED ORDER — VITAMIN B-1 100 MG PO TABS
100.0000 mg | ORAL_TABLET | Freq: Every day | ORAL | Status: DC
  Administered 2018-02-03: 100 mg via ORAL
  Filled 2018-02-02: qty 1

## 2018-02-02 MED ORDER — FOLIC ACID 1 MG PO TABS: 1.0000 mg | ORAL_TABLET | Freq: Every day | ORAL | Status: DC

## 2018-02-02 MED ORDER — LORAZEPAM 2 MG/ML IJ SOLN: 0.5000 mg | Freq: Once | INTRAMUSCULAR | Status: DC | PRN

## 2018-02-02 MED ORDER — FAMOTIDINE 20 MG PO TABS
20.0000 mg | ORAL_TABLET | Freq: Two times a day (BID) | ORAL | Status: DC
Start: 2018-02-02 — End: 2018-02-03
  Administered 2018-02-02 – 2018-02-03 (×2): 20 mg via ORAL
  Filled 2018-02-02 (×2): qty 1

## 2018-02-02 MED ORDER — INSULIN DETEMIR 100 UNIT/ML ~~LOC~~ SOLN
10.0000 [IU] | Freq: Every day | SUBCUTANEOUS | Status: DC
  Administered 2018-02-02: 10 [IU] via SUBCUTANEOUS
  Filled 2018-02-02 (×2): qty 0.1

## 2018-02-02 NOTE — Progress Notes (Signed)
PROGRESS NOTE    Juan Juarez  MAU:[id number] DOB: 1955-09-08 DOA: 01/21/2018 PCP: Rosita Fire, MD    Brief Narrative:  62 year old male who presented with a seizure. He does have significant past medical history of cirrhosis of the liver due to hepatitis C, polysubstance abuse, abdominal diabetes mellitus, hypertension and a recent subdural hematoma ( 3 weeks prior to hospitalization.) Patient failed to follow-up as an outpatient, he developed seizures, mostly focal in the left side. On the initial physical examination blood pressure 117/68, heart rate 77, respiratory rate 23, oxygen saturation 97%.moist mucous membranes, lungs clear to auscultation bilaterally, heart S1-S2 present and rhythmic, abdomen soft nontender, no lower extremity edema. Patient was lethargic but nonfocal with equal and reactive pupils. Sodium 136, potassium 4.7, chloride 105, bicarbonate 26, glucose 181, BUN 15, creatinine 0.86, White cell count 5.6, hemoglobin 12.7, hematocrit 38.0, platelets 102. Urinalysis specific gravity 1.021, 100 protein, 0-5 white cells, 0-5 RBCs. Drug screen positive for benzodiazepines and cocaine. 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 Mildly increased thickness and mass effect along the right anterior frontal convexity. Midline shift has increased from 6 to 7 mm. Chest x-ray, hypoinflated with basilar atelectasis. EKG with normal sinus rhythm.  Patient was admitted to the hospital working diagnosis of uncontrolled seizures, related to right subdural hematoma.   Patient underwent evacuation of subdural hematoma with bur holes on July 24.  Admitted to the intensive care unit July 27 due to withdrawal symptoms, required Precedex infusion.   Assessment & Plan:   Principal Problem:   Status epilepticus ([place]) Active Problems:   Polysubstance abuse (HCC)   Protein-calorie malnutrition, severe (HCC)   Thrombocytopenia  (HCC)   Cocaine abuse (HCC)   Hyperammonemia (HCC)   Subdural hematoma (HCC)   Alcoholic cirrhosis of liver without ascites (HCC)   Macrocytic anemia   Seizure (HCC)   1. Uncontrolled seizures due to right subdural hematoma. Improved mentation, will continue keppra and dilantin per neurology recommendations, out of bed as tolerated and continue physical therapy evaluation.   2. Metabolic/ toxic encephalopathy related to withdrawal. Patient more awake and alert, continue as needed alprazolam antipyschotic therapy with olanzapine 5 mg daily. Will continue neuro checks, thiamine and multivitamins.   3. Urine tract infection due to E coli, not present on admission. E coli pansensitive, will de-escalate antibiotic therapy to cephalexin to complete antibiotic therapy.  4.  T2DM. Capillary glucose 219, 251, 289, 267, 193. Continue insulin sliding scale, patient tolerating po well, will resume basal insulin with 10 units daily, at home is on 60 units daily. Continue to hold on glipizide for now.   5. HTN. Blood pressure systolic 563 mmHg, at home on lisinopril.    DVT prophylaxis: scd  Code Status:  full Family Communication: no family at the bedside  Disposition Plan/ discharge barriers: pending SNF    Consultants:   Neurology   Procedures:     Antimicrobials:   Cephalexin    Subjective: Patient having headache, moderate in intensity on the right, no radiation, no nausea or vomiting.   Objective: Vitals:   02/01/18 2355 02/02/18 0405 02/02/18 0759 02/02/18 1134  BP: 125/78 135/74 135/77 121/75  Pulse: 74 72 64 70  Resp: 14 16  17   Temp: 98.5 F (36.9 C)  98.7 F (37.1 C) 98.6 F (37 C)  TempSrc: Oral  Oral   SpO2: 99% 98% 98% 99%  Weight:      Height:  Intake/Output Summary (Last 24 hours) at 02/02/2018 1221 Last data filed at 02/02/2018 0600 Gross per 24 hour  Intake 1298.35 ml  Output 2100 ml  Net -801.65 ml   Filed Weights   01/29/18 0500 01/30/18 0500  01/31/18 0500  Weight: 68.7 kg (151 lb 7.3 oz) 74.5 kg (164 lb 3.9 oz) 76.4 kg (168 lb 6.9 oz)    Examination:   General: Not in pain or dyspnea, deconditioned  Neurology: Awake and alert, non focal  E ENT: no pallor, no icterus, oral mucosa moist Cardiovascular: No JVD. S1-S2 present, rhythmic, no gallops, rubs, or murmurs. No lower extremity edema. Pulmonary: vesicular breath sounds bilaterally, adequate air movement, no wheezing, rhonchi or rales. Gastrointestinal. Abdomen with no organomegaly, non tender, no rebound or guarding Skin. No rashes Musculoskeletal: no joint deformities     Data Reviewed: I have personally reviewed following labs and imaging studies  CBC: Recent Labs  Lab 01/28/18 0500 01/29/18 0503 01/29/18 1658 01/30/18 0518 01/31/18 0521  WBC 5.5 6.6 7.6 8.5 8.0  NEUTROABS 3.1 3.9  --  5.7 5.6  HGB 9.5* 8.7* 8.8* 10.1* 9.5*  HCT 28.5* 25.6* 25.6* 29.8* 27.9*  MCV 100.0 100.0 99.2 100.3* 98.2  PLT 84* 77* 81* 91* 87*   Basic Metabolic Panel: Recent Labs  Lab 01/27/18 0240 01/28/18 0500 01/29/18 0503 01/30/18 0518 01/31/18 0521  NA 138 139 136 137 132*  K 4.2 5.5* 3.1* 3.6 3.7  CL 111 116* 104 103 97*  CO2 22 21* 24 27 25   GLUCOSE 153* 150* 176* 104* 341*  BUN 11 10 10 14 12   CREATININE 0.74 0.54* 0.66 0.83 0.90  CALCIUM 8.0* 6.9* 7.3* 7.8* 7.4*  MG 1.6* 1.5* 1.2* 1.5* 1.6*  PHOS 2.2* 2.5 3.4 3.4 2.5   GFR: Estimated Creatinine Clearance: 78.5 mL/min (by C-G formula based on SCr of 0.9 mg/dL). Liver Function Tests: Recent Labs  Lab 01/28/18 0500 01/29/18 0503 01/30/18 0518  AST 39 35 38  ALT 24 22 24   ALKPHOS 90 79 90  BILITOT 0.9 0.7 1.0  PROT 6.0* 5.7* 6.6  ALBUMIN 2.0* 2.0* 2.1*   Recent Labs  Lab 01/28/18 0500 01/29/18 0503  LIPASE 58* 48  AMYLASE 66  --    Recent Labs  Lab 01/26/18 1707 01/28/18 0500 01/29/18 0503 01/30/18 0518 01/31/18 0521  AMMONIA 60* 38* 45* 48* 39*   Coagulation Profile: Recent Labs  Lab  01/29/18 0503 01/30/18 0518 01/31/18 0521  INR 1.58 1.34 1.37   Cardiac Enzymes: No results for input(s): CKTOTAL, CKMB, CKMBINDEX, TROPONINI in the last 168 hours. BNP (last 3 results) No results for input(s): PROBNP in the last 8760 hours. HbA1C: No results for input(s): HGBA1C in the last 72 hours. CBG: Recent Labs  Lab 02/01/18 1424 02/01/18 1852 02/01/18 2133 02/02/18 0652 02/02/18 1131  GLUCAP 219* 251* 289* 267* 193*   Lipid Profile: No results for input(s): CHOL, HDL, LDLCALC, TRIG, CHOLHDL, LDLDIRECT in the last 72 hours. Thyroid Function Tests: No results for input(s): TSH, T4TOTAL, FREET4, T3FREE, THYROIDAB in the last 72 hours. Anemia Panel: No results for input(s): VITAMINB12, FOLATE, FERRITIN, TIBC, IRON, RETICCTPCT in the last 72 hours.    Radiology Studies: I have reviewed all of the imaging during this hospital visit personally     Scheduled Meds: . folic acid  1 mg Per Tube Daily  . insulin aspart  0-9 Units Subcutaneous TID WC  . lactulose  20 g Per Tube TID  . multivitamin with minerals  1 tablet Per Tube Daily  . OLANZapine  5 mg Oral Daily  . phenytoin (DILANTIN) IV  100 mg Intravenous Q12H  . sodium chloride flush  10-40 mL Intracatheter Q12H  . thiamine  100 mg Per Tube Daily   Or  . thiamine  100 mg Intravenous Daily   Continuous Infusions: . sodium chloride 10 mL/hr at 02/01/18 2219  . dexmedetomidine (PRECEDEX) IV infusion Stopped (01/31/18 0723)  . famotidine (PEPCID) IV 20 mg (02/02/18 0837)  . levETIRAcetam 1,000 mg (02/02/18 0259)  . piperacillin-tazobactam (ZOSYN)  IV 3.375 g (02/02/18 0548)     LOS: 11 days        Mauricio Gerome Apley, MD [facility] Pager [phone]

## 2018-02-02 NOTE — Clinical Social Work Note (Signed)
Clinical Social Work Assessment  Patient Details  Name: Juan Juarez MRN: [id number] Date of Birth: [birth year]  Date of referral:  02/02/18               Reason for consult:  Facility Placement, Discharge Planning                Permission sought to share information with:  Other Permission granted to share information::  No  Name::     none given   Agency::  none given  Relationship::  none given  Contact Information:  none given   Housing/Transportation Living arrangements for the past 2 months:  Single Family Home(with friend ) Source of Information:  Patient Patient Interpreter Needed:  None Criminal Activity/Legal Involvement Pertinent to Current Situation/Hospitalization:  No - Comment as needed Significant Relationships:  Significant Other Lives with:  Significant Other Do you feel safe going back to the place where you live?  Yes Need for family participation in patient care:  Yes (Comment)  Care giving concerns:  CSW consulted for SNF placement at the time of discharge.    Social Worker assessment / plan:  CSW spoke with pt at bedside to discuss placement options. Pt expressed that pt feels that pt will not need SNF at the time of discharge. Pt reports that pt has support from a friend who lives with pt. Pt also expressed a disinterested SNF due to pt feeling that pt can not afford it.   Employment status:  Other (Comment) Insurance information:  Medicare PT Recommendations:  [place] / Referral to community resources:  [place]  Patient/Family's Response to care:  Pt appeared to be not interested in SNF placement at this time. Only wants to go home and take a shower.  Patient/Family's Understanding of and Emotional Response to Diagnosis, Current Treatment, and Prognosis:  No further questions or concerns have been presented to CSW at this time.   Emotional Assessment Appearance:  Appears stated  age Attitude/Demeanor/Rapport:  Engaged Affect (typically observed):  Accepting, Calm, Pleasant Orientation:  Oriented to Situation, Oriented to Self, Oriented to Place, Oriented to  Time Alcohol / Substance use:  Not Applicable Psych involvement (Current and /or in the community):  No (Comment)  Discharge Needs  Concerns to be addressed:  Denies Needs/Concerns at this time Readmission within the last 30 days:  No Current discharge risk:  Dependent with Mobility Barriers to Discharge:  Continued Medical Work up   [employer], [place] 02/02/2018, 10:40 AM

## 2018-02-03 ENCOUNTER — Inpatient Hospital Stay (HOSPITAL_COMMUNITY): Payer: Medicare Other

## 2018-02-03 DIAGNOSIS — D696 Thrombocytopenia, unspecified: Secondary | ICD-10-CM

## 2018-02-03 LAB — CBC WITH DIFFERENTIAL/PLATELET
ABS IMMATURE GRANULOCYTES: 0.1 10*3/uL (ref 0.0–0.1)
BASOS ABS: 0 10*3/uL (ref 0.0–0.1)
Basophils Relative: 1 %
Eosinophils Absolute: 0.1 10*3/uL (ref 0.0–0.7)
Eosinophils Relative: 2 %
HCT: 31.8 % — ABNORMAL LOW (ref 39.0–52.0)
HEMOGLOBIN: 11.1 g/dL — AB (ref 13.0–17.0)
IMMATURE GRANULOCYTES: 1 %
LYMPHS ABS: 2.2 10*3/uL (ref 0.7–4.0)
LYMPHS PCT: 37 %
MCH: 33.6 pg (ref 26.0–34.0)
MCHC: 34.9 g/dL (ref 30.0–36.0)
MCV: 96.4 fL (ref 78.0–100.0)
Monocytes Absolute: 0.6 10*3/uL (ref 0.1–1.0)
Monocytes Relative: 10 %
NEUTROS ABS: 3 10*3/uL (ref 1.7–7.7)
Neutrophils Relative %: 49 %
Platelets: 125 10*3/uL — ABNORMAL LOW (ref 150–400)
RBC: 3.3 MIL/uL — AB (ref 4.22–5.81)
RDW: 13.9 % (ref 11.5–15.5)
WBC: 6 10*3/uL (ref 4.0–10.5)

## 2018-02-03 LAB — CULTURE, BLOOD (ROUTINE X 2)
CULTURE: NO GROWTH
Culture: NO GROWTH
SPECIAL REQUESTS: ADEQUATE
Special Requests: ADEQUATE

## 2018-02-03 LAB — BASIC METABOLIC PANEL
ANION GAP: 7 (ref 5–15)
BUN: 12 mg/dL (ref 8–23)
CHLORIDE: 101 mmol/L (ref 98–111)
CO2: 25 mmol/L (ref 22–32)
Calcium: 8.1 mg/dL — ABNORMAL LOW (ref 8.9–10.3)
Creatinine, Ser: 0.8 mg/dL (ref 0.61–1.24)
GFR calc Af Amer: >60 mL/min (ref >60)
Glucose, Bld: 260 mg/dL — ABNORMAL HIGH (ref 70–99)
POTASSIUM: 4.1 mmol/L (ref 3.5–5.1)
Sodium: 133 mmol/L — ABNORMAL LOW (ref 135–145)

## 2018-02-03 LAB — GLUCOSE, CAPILLARY
GLUCOSE-CAPILLARY: 317 mg/dL — AB (ref 70–99)
Glucose-Capillary: 196 mg/dL — ABNORMAL HIGH (ref 70–99)

## 2018-02-03 MED ORDER — HALOPERIDOL LACTATE 5 MG/ML IJ SOLN
5.0000 mg | INTRAMUSCULAR | Status: AC
  Administered 2018-02-03: 5 mg via INTRAMUSCULAR
  Filled 2018-02-03: qty 1

## 2018-02-03 MED ORDER — LORAZEPAM 1 MG PO TABS
1.0000 mg | ORAL_TABLET | Freq: Once | ORAL | Status: DC | PRN
  Filled 2018-02-03: qty 1

## 2018-02-03 MED ORDER — PHENYTOIN 50 MG PO CHEW: 100.0000 mg | CHEWABLE_TABLET | Freq: Two times a day (BID) | ORAL | 0 refills | Status: DC

## 2018-02-03 MED ORDER — LEVETIRACETAM 1000 MG PO TABS: 1000.0000 mg | ORAL_TABLET | Freq: Two times a day (BID) | ORAL | 0 refills | Status: DC

## 2018-02-03 NOTE — Care Management Note (Signed)
Case Management Note  Patient Details  Name: Even Budlong MRN: [id number] Date of Birth: 10-21-1955  Subjective/Objective:                    Action/Plan: Pt discharging home with orders for [facility] services. CM provided choice to patient and his girlfriend, April, and [facility] was selected. Cory with [facility] notified and accepted the referral.  Pt with orders for walker. James with [facility] notified and will deliver to the room. April states that patients mother will provide transportation home. CM reached out to the mother without a response, message left.   Expected Discharge Date:  02/03/18               Expected Discharge Plan:  [place]  In-House Referral:     Discharge planning Services  CM Consult  Post Acute Care Choice:  Durable Medical Equipment, Home Health Choice offered to:  Patient  DME Arranged:  Walker rolling DME Agency:  [place] Arranged:  RN, PT, OT, Nurse's Aide, Social Work [place] Agency:  [place]  Status of Service:  Completed, signed off  If discussed at [employer] of Stay Meetings, dates discussed:    Additional Comments:  Pollie Friar, RN 02/03/2018, 1:30 PM

## 2018-02-03 NOTE — Progress Notes (Signed)
Sent for patient to be done.  Transport got to room and called [place] stating RN Sherlynn Stalls says Doctors do not want exam today.  Patients MRI still on hold from 7/28

## 2018-02-03 NOTE — Progress Notes (Addendum)
Occupational Therapy Treatment Patient Details Name: Juan Juarez MRN: [id number] DOB: 1955/08/15 Today's Date: 02/03/2018    History of present illness 62 y.o. male with a history of hepatic cirrhosis, hepatitis C, polysubstance abuse, IDDM2, and HTN who was admitted 01/21/18 with seizure. He had been admitted 3 weeks prior to this admission with SDH.  Repeat CT head showed stable right SDH with 75mm R>L midline shift.  Neurosurgery performed SDH evacuation with bur holes x2 on 01/22/18.     OT comments  Pt demonstrating progress towards OT goals, though minimally participative this session. Pt completed toileting and standing grooming ADLs with supervision-minguard assist. Pt continues to remain unsteady with mobility in room; continues to demonstrate cognitive deficits including decreased safety awareness and decreased insight into current deficits placing him at a higher risk for falls. Given pt's current cognitive status and fall risk recommend SNF level therapies at time of discharge. Will continue to follow acutely.    Follow Up Recommendations  SNF;Supervision/Assistance - 24 hour    Equipment Recommendations  None recommended by OT          Precautions / Restrictions Precautions Precautions: Fall Restrictions Weight Bearing Restrictions: No       Mobility Bed Mobility Overal bed mobility: Modified Independent Bed Mobility: Supine to Sit;Sit to Supine           General bed mobility comments: increased time and effort  Transfers Overall transfer level: Needs assistance Equipment used: None Transfers: Sit to/from Stand Sit to Stand: Min guard         General transfer comment: min guard for safety    Balance Overall balance assessment: Needs assistance Sitting-balance support: Feet supported Sitting balance-Leahy Scale: Good     Standing balance support: During functional activity Standing balance-Leahy Scale: Fair                              ADL either performed or assessed with clinical judgement   ADL Overall ADL's : Needs assistance/impaired     Grooming: Min guard;Standing;Wash/dry Geophysical data processor Transfer: Min guard;Supervision/safety;Ambulation;Regular Museum/gallery exhibitions officer and Hygiene: Min guard;Supervision/safety;Sit to/from stand       Functional mobility during ADLs: Min guard General ADL Comments: pt completing room level mobility, toileting and standing grooming ADLs with minguard assist; declined further ADL completion or hallway level mobility; attempted additional focus on cognitive tasks including questions related to home safety, pt minimally participative at this time, laying in bed facing away from therapist      Vision       Perception     Praxis      Cognition Arousal/Alertness: Awake/alert Behavior During Therapy: Restless;Impulsive Overall Cognitive Status: No family/caregiver present to determine baseline cognitive functioning Area of Impairment: Attention;Problem solving;Memory;Safety/judgement;Following commands               Rancho Levels of Cognitive Functioning Rancho Los Amigos Scales of Cognitive Functioning: Confused/inappropriate/non-agitated   Current Attention Level: Sustained Memory: Decreased short-term memory Following Commands: Follows one step commands inconsistently;Follows one step commands with increased time Safety/Judgement: Decreased awareness of safety;Decreased awareness of deficits   Problem Solving: Requires verbal cues General Comments: pt continues to be unaware of deficits and upset throughout session as he wishes to return home; attempted to provide questions related to higher level cognition including safety at home. When asked  what pt would do if there were a fire in the home he reported he would use a fire extinguisher if it were a small fire, reports he would leave the home if it were larger, when  cued/questioned if he would call anyone pt reporting no, "they will see it if it's big enough". Pt also asking where he is and not answering when cueing pt to provided answer, when asked what the year is pt reports it's the "year of the cat"         Exercises     Shoulder Instructions       General Comments      Pertinent Vitals/ Pain       Pain Assessment: No/denies pain  Home Living                                          Prior Functioning/Environment              Frequency           Progress Toward Goals  OT Goals(current goals can now be found in the care plan section)  Progress towards OT goals: Progressing toward goals  Acute Rehab OT Goals Patient Stated Goal: to go home  OT Goal Formulation: With patient Time For Goal Achievement: 02/09/18 Potential to Achieve Goals: Good  Plan Discharge plan needs to be updated    Co-evaluation                 AM-PAC PT "6 Clicks" Daily Activity     Outcome Measure   Help from another person eating meals?: A Little Help from another person taking care of personal grooming?: A Little Help from another person toileting, which includes using toliet, bedpan, or urinal?: A Lot Help from another person bathing (including washing, rinsing, drying)?: A Little Help from another person to put on and taking off regular upper body clothing?: A Little Help from another person to put on and taking off regular lower body clothing?: A Lot 6 Click Score: 16    End of Session    OT Visit Diagnosis: Unsteadiness on feet (R26.81);Other abnormalities of gait and mobility (R26.89);Muscle weakness (generalized) (M62.81);Cognitive communication deficit (R41.841)   Activity Tolerance Treatment limited secondary to agitation;Other (comment)(pt with decreased willingness to participate )   Patient Left in bed;with call bell/phone within reach;with nursing/sitter in room;Other (comment)(sitter present )   Nurse  Communication Mobility status        Time: 2353-6144 OT Time Calculation (min): 16 min  Charges: OT General Charges $OT Visit: 1 Visit OT Treatments $Self Care/Home Management : 8-22 mins  Lou Cal, OT Pager [phone] 02/03/2018    Raymondo Band 02/03/2018, 4:26 PM

## 2018-02-03 NOTE — Progress Notes (Signed)
Pt all dressed up in the hall way with his belonging in a bag trying to leave the unit against medical advice. MD on call paged and talked with pt. But pt refused to leave. Pt was asked to sighed  AMA form. Mr not in his rightful mind to go home alone.. MD called and IVC but pt changed his mind to stay overnight. Due medication given snack offered. . RN will continue to monitor.

## 2018-02-03 NOTE — Progress Notes (Signed)
Physical Therapy Treatment Patient Details Name: Juan Juarez MRN: [id number] DOB: [birth year] Today's Date: 02/03/2018    History of Present Illness 62 y.o. male with a history of hepatic cirrhosis, hepatitis C, polysubstance abuse, IDDM2, and HTN who was admitted 01/21/18 with seizure. He had been admitted 3 weeks prior to this admission with SDH.  Repeat CT head showed stable right SDH with 36mm R>L midline shift.  Neurosurgery performed SDH evacuation with bur holes x2 on 01/22/18.      PT Comments    Patient is making progress toward mobility goals however pt does continue to demonstrate impaired balance and cognition increasing risk for falls. Continue to progress as tolerated.    Follow Up Recommendations  SNF;Supervision/Assistance - 24 hour     Equipment Recommendations  Other (comment)(tba)    Recommendations for Other Services       Precautions / Restrictions Precautions Precautions: Fall Restrictions Weight Bearing Restrictions: No    Mobility  Bed Mobility Overal bed mobility: Modified Independent Bed Mobility: Supine to Sit;Sit to Supine           General bed mobility comments: increased time and effort  Transfers Overall transfer level: Needs assistance Equipment used: None Transfers: Sit to/from Stand Sit to Stand: Min guard         General transfer comment: min guard for safety  Ambulation/Gait Ambulation/Gait assistance: Min guard Gait Distance (Feet): (hallway ambulation) Assistive device: None Gait Pattern/deviations: Step-through pattern;Decreased stride length;Wide base of support;Drifts right/left;Staggering left;Staggering right;Trunk flexed     General Gait Details: pt with impulsive behavior despite cues for safe gait speed and cues for navigating environment pt with frequently changing gait speeds and tendency to lose balance laterally; pt running into objects on L side    Stairs Stairs: Yes Stairs assistance: Min guard Stair  Management: One rail Right;Alternating pattern;Forwards Number of Stairs: 3 General stair comments: pt impulsive on stairs and not very receptive to cues for safety   Wheelchair Mobility    Modified Rankin (Stroke Patients Only) Modified Rankin (Stroke Patients Only) Pre-Morbid Rankin Score: No symptoms Modified Rankin: Moderately severe disability     Balance Overall balance assessment: Needs assistance Sitting-balance support: Feet supported Sitting balance-Leahy Scale: Good     Standing balance support: During functional activity Standing balance-Leahy Scale: Fair                              Cognition Arousal/Alertness: Awake/alert Behavior During Therapy: Restless;Impulsive Overall Cognitive Status: No family/caregiver present to determine baseline cognitive functioning Area of Impairment: Attention;Problem solving;Memory;Safety/judgement;Following commands               Rancho Levels of Cognitive Functioning Rancho Los Amigos Scales of Cognitive Functioning: Confused/inappropriate/non-agitated   Current Attention Level: Sustained Memory: Decreased short-term memory Following Commands: Follows one step commands inconsistently;Follows one step commands with increased time Safety/Judgement: Decreased awareness of safety;Decreased awareness of deficits   Problem Solving: Requires verbal cues General Comments: pt continues to be unaware of deficits and upset throughout session as he wishes to return home       Exercises      General Comments        Pertinent Vitals/Pain Pain Assessment: No/denies pain    Home Living                      Prior Function            PT Goals (current  goals can now be found in the care plan section) Acute Rehab PT Goals Patient Stated Goal: to get OOB PT Goal Formulation: Patient unable to participate in goal setting Time For Goal Achievement: 02/09/18 Progress towards PT goals: Progressing toward  goals    Frequency    Min 3X/week      PT Plan Current plan remains appropriate    Co-evaluation              AM-PAC PT "6 Clicks" Daily Activity  Outcome Measure  Difficulty turning over in bed (including adjusting bedclothes, sheets and blankets)?: A Little Difficulty moving from lying on back to sitting on the side of the bed? : A Little Difficulty sitting down on and standing up from a chair with arms (e.g., wheelchair, bedside commode, etc,.)?: A Little Help needed moving to and from a bed to chair (including a wheelchair)?: A Little Help needed walking in hospital room?: A Little Help needed climbing 3-5 steps with a railing? : A Little 6 Click Score: 18    End of Session Equipment Utilized During Treatment: Gait belt Activity Tolerance: Patient tolerated treatment well Patient left: in bed;with call bell/phone within reach;with bed alarm set;with nursing/sitter in room Nurse Communication: Mobility status PT Visit Diagnosis: Difficulty in walking, not elsewhere classified (R26.2);Other symptoms and signs involving the nervous system (R29.898)     Time:  - 09:10-09:20    Charges:  $Gait Training: 8-22 mins                     Earney Navy, PTA Pager: [phone]     Darliss Cheney 02/03/2018, 11:46 AM

## 2018-02-03 NOTE — Progress Notes (Signed)
Pt calmed and resting quietly  In bed,  mother made aware of pt status. Will continue to monitor.,

## 2018-02-03 NOTE — Discharge Summary (Signed)
Physician Discharge Summary  Juan Juarez HAL:[id number] DOB: 10/30/1955 DOA: 01/21/2018  PCP: Rosita Fire, MD  Admit date: 01/21/2018 Discharge date: 02/03/2018  Admitted From: Home  Disposition:  Home   Recommendations for Outpatient Follow-up and new medication changes:  1. Follow up Dr. Legrand Rams in 7 days 2. Patient will continue Keppra and Dilantin has been added 3. Follow up with neurosurgery in 2 weeks for suture removal   Home Health: Yes   Equipment/Devices: no   Discharge Condition: Stable  CODE STATUS: full Diet recommendation:  Regular   Brief/Interim Summary: 62 year old male who presented with a seizures. He does have significant past medical history of cirrhosis of the liver due to hepatitis C, polysubstance abuse, type 2 diabetes mellitus, hypertension and a recent subdural hematoma ( 3 weeks prior to hospitalization.) Patient failed to follow-up as an outpatient, he developed seizures, mostly focal in the left side. On the initial physical examination blood pressure 117/68, heart rate 77, respiratory rate 23, oxygen saturation 97%.moist mucous membranes, lungs clear to auscultation bilaterally, heart S1-S2 present and rhythmic, abdomen soft nontender, no lower extremity edema. Patient was lethargic but nonfocal with equal and reactive pupils. Sodium 136, potassium 4.7, chloride 105, bicarbonate 26, glucose 181, BUN 15, creatinine 0.86, White cell count 5.6, hemoglobin 12.7, hematocrit 38.0, platelets 102. Urinalysis specific gravity 1.021, 100 protein, 0-5 white cells, 0-5 RBCs. Drug screen positive for benzodiazepines and cocaine. 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 Mildly increased thickness and mass effect along the right anterior frontal convexity. Midline shift has increased from 6 to 7 mm. Chest x-ray, hypoinflated with basilar atelectasis. EKG with normal sinus rhythm.  Patient was admitted to the  hospital with the working diagnosis of uncontrolled seizures, related to right subdural hematoma.   Patient underwent evacuation of subdural hematoma with bur holes on July 24.  Admitted to the intensive care unit July 27 due to withdrawal symptoms, required Precedex infusion  1.  Uncontrolled seizures due to right subdural hematoma complicated by acute metabolic encephalopathy/withdrawal syndrome.  Patient was seen by neurology, he received a load with fosphenytoin and Keppra. In the setting of mild mass-effect and recurrent seizures, decision was made to proceed with a right bur holes for evacuation of subdural hematoma. Further work-up with electroencephalography (July 27) resulted in abnormal study secondary to right hemispheric PLEDS activity that at times generalizes to a GPEDS activity. July 28, 2 day intensive EEG monitoring with simultaneous video monitoring demonstrated improvement and resolution of subclinical electrographic seizures. Patient continued to be altered postprocedure, positive agitation and signs of withdrawal.  He received benzodiazepines with no significant improvement, he was transferred to the intensive care unit for Precedex infusion.  After a prolonged hospital stay his mentation progressively improved and he was successfully transferred to the medical ward.  Patient will continue taking Keppra and Dilantin at discharge.  Will need to follow-up with neurosurgery in 2 weeks for suture removal.  2.  Urinary tract infection due to E. coli, not present on admission.  Urinalysis tested positive for urine tract infection, culture returned positive for E. coli, he was treated with appropriate antibiotic therapy for 5 days.   3.  Type 2 diabetes mellitus.  Patient was placed on insulin sliding scale for glucose coverage and monitoring, basal insulin with 10 units of Levemir, at home he will resume his usual dose of Levemir, glipizide and metformin.   4.  Hypertension.  Initially  antihypertensive agents were  discontinued due to acute illness, posteriorly lisinopril was resumed with good toleration.  Discharge Diagnoses:  Principal Problem:   Status epilepticus ([place]) Active Problems:   Polysubstance abuse ([place])   Protein-calorie malnutrition, severe ([facility])   Thrombocytopenia ([facility])   Cocaine abuse ([place])   Hyperammonemia ([facility])   Subdural hematoma ([facility])   Alcoholic cirrhosis of liver without ascites ([facility])   Macrocytic anemia   Seizure ([place])    Discharge Instructions   Allergies as of 02/03/2018   No Known Allergies     Medication List    TAKE these medications   ALPRAZolam 1 MG tablet Commonly known as:  XANAX Take 1 mg by mouth at bedtime as needed for anxiety or sleep.   doxepin 25 MG capsule Commonly known as:  SINEQUAN Take 25 mg by mouth at bedtime as needed (sleep and anxiety).   glipiZIDE 5 MG tablet Commonly known as:  GLUCOTROL Take 1 tablet by mouth daily.   LEVEMIR 100 UNIT/ML injection Generic drug:  insulin detemir Inject 60 Units as directed daily.   levETIRAcetam 1000 MG tablet Commonly known as:  KEPPRA Take 1 tablet (1,000 mg total) by mouth 2 (two) times daily. What changed:    medication strength  how much to take   lisinopril 20 MG tablet Commonly known as:  PRINIVIL,ZESTRIL Take 1 tablet by mouth daily.   metFORMIN 1000 MG tablet Commonly known as:  GLUCOPHAGE Take 1 tablet (1,000 mg total) by mouth 2 (two) times daily with a meal.   phenytoin 50 MG tablet Commonly known as:  DILANTIN Chew 2 tablets (100 mg total) by mouth 2 (two) times daily.   senna-docusate 8.6-50 MG tablet Commonly known as:  Senokot-S Take 1 tablet by mouth at bedtime as needed for mild constipation.            Durable Medical Equipment  (From admission, onward)        Start     Ordered   02/03/18 1228  For home use only DME Gilford Rile  [facility])  Once    Question:  Patient needs a walker to treat with the following condition   Answer:  Ambulatory dysfunction   02/03/18 1228      No Known Allergies  Consultations:  Neurology   Neurosurgery    Procedures/Studies: Ct Head Wo Contrast  Result Date: 01/28/2018 CLINICAL DATA:  Followup subdural hematoma evacuation. EXAM: CT HEAD WITHOUT CONTRAST TECHNIQUE: Contiguous axial images were obtained from the base of the skull through the vertex without intravenous contrast. COMPARISON:  01/24/2018 FINDINGS: Brain: This is a severely motion degraded examination. Compared to the study of 4 days ago, I do not think that there is any worsening or new finding. The patient has had previous a vacuo a shin of a right convexity subdural hematoma. There is a small amount of residual subdural fluid and blood but I do not think there is further accumulation or fresh bleeding. There is no significant midline shift detected. No focal brain abnormality is seen, but as noted there is extensive motion degradation. Vascular: No primary vascular finding. Skull: 2 burr holes on the right. Sinuses/Orbits: Negative Other: None IMPRESSION: Severely motion degraded exam. There does not appear to be any new or worsening finding. Small amount of residual subdural material on the right without evidence of increase or fresh bleeding. Electronically Signed   By: Nelson Chimes M.D.   On: 01/28/2018 12:53   Ct Head Wo Contrast  Result Date: 01/24/2018 CLINICAL DATA:  Intracranial hemorrhage  follow up EXAM: CT HEAD WITHOUT CONTRAST TECHNIQUE: Contiguous axial images were obtained from the base of the skull through the vertex without intravenous contrast. COMPARISON:  01/23/2018 FINDINGS: Brain: Unchanged size of right convexity subdural hematoma, status post transcranial evacuation. 3 mm leftward midline shift is unchanged. The size and configuration of the ventricles are unchanged. No new site of hemorrhage. Mild persistent pneumocephalus. Vascular: No hyperdense vessel or unexpected calcification. Skull: 2  parietal burr holes on the right. Overlying soft tissue swelling. Sinuses/Orbits: Paranasal sinuses and mastoids are clear. Normal orbits. Other: None. IMPRESSION: Unchanged right convexity subdural hematoma, status post evacuation, with unchanged 3 mm leftward midline shift. Electronically Signed   By: Ulyses Jarred M.D.   On: 01/24/2018 05:07   Ct Head Wo Contrast  Result Date: 01/23/2018 CLINICAL DATA:  Follow-up of intracranial hemorrhage EXAM: CT HEAD WITHOUT CONTRAST TECHNIQUE: Contiguous axial images were obtained from the base of the skull through the vertex without intravenous contrast. COMPARISON:  01/22/2018 head CT FINDINGS: Brain: Interval transcranial drainage of right convexity subdural hematoma, which has decreased in size with thickness now measuring 3 mm, previously 10 mm. Leftward midline shift has decreased, now measuring 3 mm, previously 6 mm. There is no hydrocephalus. Mild mass effect on the right lateral ventricle has resolved. No new site of hemorrhage. Expected postoperative pneumocephalus without tension effects. Vascular: No abnormal hyperdensity of the major intracranial arteries or dural venous sinuses. No intracranial atherosclerosis. Skull: There are 2 right parietal burr holes. Sinuses/Orbits: No fluid levels or advanced mucosal thickening of the visualized paranasal sinuses. No mastoid or middle ear effusion. The orbits are normal. IMPRESSION: Transcranial evacuation of right convexity subdural hematoma, decreased in size, with improvement of midline shift. Electronically Signed   By: Ulyses Jarred M.D.   On: 01/23/2018 06:44   Ct Head Wo Contrast  Result Date: 01/22/2018 CLINICAL DATA:  Status epilepticus recent hospitalization for subdural EXAM: CT HEAD WITHOUT CONTRAST TECHNIQUE: Contiguous axial images were obtained from the base of the skull through the vertex without intravenous contrast. COMPARISON:  01/20/2018, 01/11/2018, 01/01/2018 FINDINGS: Brain: Motion degraded  study. This limits the examination. Right holo hemispheric mixed density subdural hematoma, grossly similar in thickness, measuring 14 mm maximum on coronal images. Similar appearance of 6 mm midline shift to the left. Mass effect on the right lateral ventricle. Mild asymmetric enlargement of left lateral ventricle. Generalized sulcal effacement on the right consistent with edema. Basilar cisterns are patent. No mass is visualized. Vascular: No hyperdense vessels.  Carotid vascular calcification Skull: No fracture Sinuses/Orbits: No acute finding. Other: None IMPRESSION: 1. Limited study secondary to motion degradation. 2. Known right complex holo hemispheric subdural hematoma on the right is grossly similar in size compared to the most recent CT, measuring up to 14 mm. Similar degree of 6 mm midline shift to the left with mass effect on the right lateral ventricle. Generalized sulcal effacement/edema within the right hemisphere as before. Electronically Signed   By: Donavan Foil M.D.   On: 01/22/2018 00:55   Ct Head Wo Contrast  Result Date: 01/20/2018 CLINICAL DATA:  62 year old male with altered mental status. EXAM: CT HEAD WITHOUT CONTRAST TECHNIQUE: Contiguous axial images were obtained from the base of the skull through the vertex without intravenous contrast. COMPARISON:  Several prior exams most recent 01/20/2018 10:46 a.m. FINDINGS: Brain: Large right-sided subdural hematoma with septation with maximal thickness of 15.2 mm with mass effect upon the right lateral ventricle with midline shift to the left by 6.6  mm similar to the recent exam. No new intracranial hemorrhage. No CT evidence of large acute infarct. No intracranial mass lesion noted on this unenhanced exam. Vascular: Vascular calcifications Skull: No acute abnormality Sinuses/Orbits: No acute orbital abnormality. Visualized paranasal sinuses are clear. Other: Mastoid air cells and middle ear cavities are clear. IMPRESSION: When compared to  examination performed earlier today, there is a relatively stable appearance of large complex right-sided subdural hematoma which causes mass effect upon the right lateral ventricle and midline shift to the left as detailed above. No new intracranial hemorrhage or evidence of thrombotic infarct. Electronically Signed   By: Genia Del M.D.   On: 01/20/2018 18:12   Ct Head Wo Contrast  Result Date: 01/20/2018 CLINICAL DATA:  Seizure, new, acute, history of trauma. EXAM: CT HEAD WITHOUT CONTRAST TECHNIQUE: Contiguous axial images were obtained from the base of the skull through the vertex without intravenous contrast. COMPARISON:  01/11/2018 FINDINGS: Brain: Known subdural collection along the right cerebral convexity with stable predominately low-density. There are stable high-density septations seen in the mid in dorsal portions. There has been increase thickness along the right anterior frontal convexity, see image 2:19, with greater mass effect at this level. The collection measures 15 mm in thickness today as compared to 13 mm previously. Midline shift measures 7 mm, previously 6 mm. No entrapment, infarct, or acute blood products. Vascular: No hyperdense vessel.  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 Pocket of mildly increased thickness and mass effect along the right anterior frontal convexity. Midline shift has increased from 6 to 7 mm. Electronically Signed   By: Monte Fantasia M.D.   On: 01/20/2018 11:08   Dg Chest Port 1 View  Result Date: 01/30/2018 CLINICAL DATA:  Shortness of Breath EXAM: PORTABLE CHEST 1 VIEW COMPARISON:  01/29/2018 FINDINGS: Cardiac shadow is stable. Feeding catheter and right-sided PICC line are again seen and stable. Lungs are well aerated with some mild persistent basilar changes stable from the prior exam. No new focal infiltrate is  seen. No bony abnormality is noted. IMPRESSION: Stable bibasilar changes when compared with the prior exam. Tubes and lines as described. Electronically Signed   By: Inez Catalina M.D.   On: 01/30/2018 09:17   Dg Chest Port 1 View  Result Date: 01/29/2018 CLINICAL DATA:  Shortness of breath. EXAM: PORTABLE CHEST 1 VIEW COMPARISON:  Radiograph of January 28, 2018. FINDINGS: The heart size and mediastinal contours are within normal limits. No pneumothorax or pleural effusion is noted. Stable position of right-sided PICC line and feeding tube. Stable bilateral interstitial densities are noted throughout both lungs concerning for edema or atypical inflammation. The visualized skeletal structures are unremarkable. IMPRESSION: Stable support apparatus. Stable bilateral lung opacities as described above. Electronically Signed   By: Marijo Conception, M.D.   On: 01/29/2018 07:29   Dg Chest Port 1 View  Result Date: 01/28/2018 CLINICAL DATA:  Shortness of breath EXAM: PORTABLE CHEST 1 VIEW COMPARISON:  Yesterday FINDINGS: A feeding tube at least reaches the stomach. Right upper extremity PICC with tip at the SVC. Low volume chest with generalized interstitial coarsening. Normal heart size for technique. No visible effusion or pneumothorax. IMPRESSION: Stable interstitial opacity accentuated by low volumes. This appearance could be from vascular congestion or bronchitis. New right upper extremity PICC in good position. Electronically Signed   By: Monte Fantasia M.D.   On: 01/28/2018 09:19   Dg  Chest Port 1 View  Result Date: 01/27/2018 CLINICAL DATA:  62 year old male with a history of acute respiratory failure EXAM: PORTABLE CHEST 1 VIEW COMPARISON:  01/21/2018, 01/20/2018, 01/11/2018 FINDINGS: Cardiomediastinal silhouette unchanged in size and contour. No evidence of central vascular congestion. No interlobular septal thickening. Low lung volumes accentuate the interstitium. Interval placement of enteric feeding tube  which terminates out of the field of view within the abdomen. No pneumothorax. No new confluent airspace disease. IMPRESSION: Low lung volumes accentuate the interstitium/vasculature, with mild atelectasis/consolidation. Interval placement of enteric feeding tube terminating out of the field of view. Electronically Signed   By: Corrie Mckusick D.O.   On: 01/27/2018 07:31   Dg Chest Portable 1 View  Result Date: 01/21/2018 CLINICAL DATA:  Recent seizure activity EXAM: PORTABLE CHEST 1 VIEW COMPARISON:  01/20/2018 FINDINGS: The heart size and mediastinal contours are within normal limits. Both lungs are clear. The visualized skeletal structures are unremarkable. IMPRESSION: No active disease. Electronically Signed   By: Inez Catalina M.D.   On: 01/21/2018 21:25   Portable Chest 1 View  Result Date: 01/20/2018 CLINICAL DATA:  62 year old male. Somnolence. Subsequent encounter. EXAM: PORTABLE CHEST 1 VIEW COMPARISON:  01/11/2018 chest x-ray. FINDINGS: No infiltrate, congestive heart failure or pneumothorax. No plain film evidence of pulmonary malignancy. Heart size within normal limits. Aortic calcifications. No acute osseous abnormality. IMPRESSION: No acute cardiopulmonary abnormality. Aortic Atherosclerosis (ICD10-I70.0). Electronically Signed   By: Genia Del M.D.   On: 01/20/2018 18:33   Dg Abd Portable 1v  Result Date: 01/25/2018 CLINICAL DATA:  Feeding tube placement. EXAM: PORTABLE ABDOMEN - 1 VIEW COMPARISON:  None. FINDINGS: Feeding catheter terminates at the expected location of the gastric antrum/pyloric region. The bowel gas pattern is normal. No radio-opaque calculi or other significant radiographic abnormality are seen. IMPRESSION: Feeding catheter terminates at the expected location of the gastric antrum/pyloric region. Electronically Signed   By: Fidela Salisbury M.D.   On: 01/25/2018 13:08   [place] Ekg Site Rite  Result Date: 01/27/2018 If Site Rite image not attached, placement could  not be confirmed due to current cardiac rhythm.      Subjective: Patient is feeling well, no nausea or vomiting, no further headache, has been out of bed and tolerating well po.   Discharge Exam: Vitals:   02/03/18 0625 02/03/18 0911  BP: 113/72 122/71  Pulse: 84 72  Resp: 20 18  Temp: 98.2 F (36.8 C) 98.8 F (37.1 C)  SpO2: 98% 97%   Vitals:   02/02/18 2015 02/03/18 0000 02/03/18 0625 02/03/18 0911  BP: 137/87 125/75 113/72 122/71  Pulse: 70 67 84 72  Resp: 18  20 18   Temp: 98.5 F (36.9 C) 98.1 F (36.7 C) 98.2 F (36.8 C) 98.8 F (37.1 C)  TempSrc: Oral Oral Oral Oral  SpO2: 99% 95% 98% 97%  Weight:      Height:        General: Not in pain or dyspnea Neurology: Awake and alert, non focal  E ENT: no pallor, no icterus, oral mucosa moist Cardiovascular: No JVD. S1-S2 present, rhythmic, no gallops, rubs, or murmurs. No lower extremity edema. Pulmonary: vesicular breath sounds bilaterally, adequate air movement, no wheezing, rhonchi or rales. Gastrointestinal. Abdomen with no organomegaly, non tender, no rebound or guarding Skin. No rashes Musculoskeletal: no joint deformities   The results of significant diagnostics from this hospitalization (including imaging, microbiology, ancillary and laboratory) are listed below for reference.     Microbiology: Recent Results (from  the past 240 hour(s))  Culture, Urine     Status: Abnormal   Collection Time: 01/29/18  4:24 PM  Result Value Ref Range Status   Specimen Description URINE, CLEAN CATCH  Final   Special Requests   Final    NONE Performed at [facility], [place] [address] [place], [place] [address]    Culture >=100,000 COLONIES/mL ESCHERICHIA COLI (A)  Final   Report Status 01/31/2018 FINAL  Final   Organism ID, Bacteria ESCHERICHIA COLI (A)  Final      Susceptibility   Escherichia coli - MIC*    AMPICILLIN <=2 SENSITIVE Sensitive     CEFAZOLIN <=4 SENSITIVE Sensitive     CEFTRIAXONE <=1 SENSITIVE  Sensitive     CIPROFLOXACIN <=0.25 SENSITIVE Sensitive     GENTAMICIN <=1 SENSITIVE Sensitive     IMIPENEM <=0.25 SENSITIVE Sensitive     NITROFURANTOIN <=16 SENSITIVE Sensitive     TRIMETH/SULFA <=20 SENSITIVE Sensitive     AMPICILLIN/SULBACTAM <=2 SENSITIVE Sensitive     PIP/TAZO <=4 SENSITIVE Sensitive     Extended ESBL NEGATIVE Sensitive     * >=100,000 COLONIES/mL ESCHERICHIA COLI  Culture, blood (routine x 2)     Status: None (Preliminary result)   Collection Time: 01/29/18  7:19 PM  Result Value Ref Range Status   Specimen Description BLOOD LEFT ANTECUBITAL  Final   Special Requests   Final    BOTTLES DRAWN AEROBIC AND ANAEROBIC Blood Culture adequate volume   Culture   Final    NO GROWTH 4 DAYS Performed at [facility], [address], [place], [place] [address]    Report Status PENDING  Incomplete  Culture, blood (routine x 2)     Status: None (Preliminary result)   Collection Time: 01/29/18  7:19 PM  Result Value Ref Range Status   Specimen Description BLOOD BLOOD LEFT HAND  Final   Special Requests   Final    BOTTLES DRAWN AEROBIC AND ANAEROBIC Blood Culture adequate volume   Culture   Final    NO GROWTH 4 DAYS Performed at [facility], [place] [address] [place], [place] [address]    Report Status PENDING  Incomplete     Labs: BNP (last 3 results) No results for input(s): BNP in the last 8760 hours. Basic Metabolic Panel: Recent Labs  Lab 01/28/18 0500 01/29/18 0503 01/30/18 0518 01/31/18 0521 02/03/18 0323  NA 139 136 137 132* 133*  K 5.5* 3.1* 3.6 3.7 4.1  CL 116* 104 103 97* 101  CO2 21* 24 27 25 25   GLUCOSE 150* 176* 104* 341* 260*  BUN 10 10 14 12 12   CREATININE 0.54* 0.66 0.83 0.90 0.80  CALCIUM 6.9* 7.3* 7.8* 7.4* 8.1*  MG 1.5* 1.2* 1.5* 1.6*  --   PHOS 2.5 3.4 3.4 2.5  --    Liver Function Tests: Recent Labs  Lab 01/28/18 0500 01/29/18 0503 01/30/18 0518  AST 39 35 38  ALT 24 22 24   ALKPHOS 90 79 90  BILITOT 0.9 0.7 1.0   PROT 6.0* 5.7* 6.6  ALBUMIN 2.0* 2.0* 2.1*   Recent Labs  Lab 01/28/18 0500 01/29/18 0503  LIPASE 58* 48  AMYLASE 66  --    Recent Labs  Lab 01/28/18 0500 01/29/18 0503 01/30/18 0518 01/31/18 0521  AMMONIA 38* 45* 48* 39*   CBC: Recent Labs  Lab 01/28/18 0500 01/29/18 0503 01/29/18 1658 01/30/18 0518 01/31/18 0521 02/03/18 0323  WBC 5.5 6.6 7.6 8.5  8.0 6.0  NEUTROABS 3.1 3.9  --  5.7 5.6 3.0  HGB 9.5* 8.7* 8.8* 10.1* 9.5* 11.1*  HCT 28.5* 25.6* 25.6* 29.8* 27.9* 31.8*  MCV 100.0 100.0 99.2 100.3* 98.2 96.4  PLT 84* 77* 81* 91* 87* 125*   Cardiac Enzymes: No results for input(s): CKTOTAL, CKMB, CKMBINDEX, TROPONINI in the last 168 hours. BNP: Invalid input(s): POCBNP CBG: Recent Labs  Lab 02/02/18 1131 02/02/18 1638 02/02/18 2135 02/03/18 0624 02/03/18 1134  GLUCAP 193* 256* 242* 196* 317*   D-Dimer No results for input(s): DDIMER in the last 72 hours. Hgb A1c No results for input(s): HGBA1C in the last 72 hours. Lipid Profile No results for input(s): CHOL, HDL, LDLCALC, TRIG, CHOLHDL, LDLDIRECT in the last 72 hours. Thyroid function studies No results for input(s): TSH, T4TOTAL, T3FREE, THYROIDAB in the last 72 hours.  Invalid input(s): FREET3 Anemia work up No results for input(s): VITAMINB12, FOLATE, FERRITIN, TIBC, IRON, RETICCTPCT in the last 72 hours. Urinalysis    Component Value Date/Time   COLORURINE YELLOW 01/29/2018 1624   APPEARANCEUR HAZY (A) 01/29/2018 1624   LABSPEC 1.015 01/29/2018 1624   PHURINE 5.0 01/29/2018 1624   GLUCOSEU NEGATIVE 01/29/2018 1624   HGBUR NEGATIVE 01/29/2018 1624   BILIRUBINUR NEGATIVE 01/29/2018 1624   KETONESUR NEGATIVE 01/29/2018 1624   PROTEINUR NEGATIVE 01/29/2018 1624   UROBILINOGEN 0.2 07/01/2014 2244   NITRITE NEGATIVE 01/29/2018 1624   LEUKOCYTESUR MODERATE (A) 01/29/2018 1624   Sepsis Labs Invalid input(s): PROCALCITONIN,  WBC,  LACTICIDVEN Microbiology Recent Results (from the past 240  hour(s))  Culture, Urine     Status: Abnormal   Collection Time: 01/29/18  4:24 PM  Result Value Ref Range Status   Specimen Description URINE, CLEAN CATCH  Final   Special Requests   Final    NONE Performed at [facility], [place] [address], [place], [place] [address]    Culture >=100,000 COLONIES/mL ESCHERICHIA COLI (A)  Final   Report Status 01/31/2018 FINAL  Final   Organism ID, Bacteria ESCHERICHIA COLI (A)  Final      Susceptibility   Escherichia coli - MIC*    AMPICILLIN <=2 SENSITIVE Sensitive     CEFAZOLIN <=4 SENSITIVE Sensitive     CEFTRIAXONE <=1 SENSITIVE Sensitive     CIPROFLOXACIN <=0.25 SENSITIVE Sensitive     GENTAMICIN <=1 SENSITIVE Sensitive     IMIPENEM <=0.25 SENSITIVE Sensitive     NITROFURANTOIN <=16 SENSITIVE Sensitive     TRIMETH/SULFA <=20 SENSITIVE Sensitive     AMPICILLIN/SULBACTAM <=2 SENSITIVE Sensitive     PIP/TAZO <=4 SENSITIVE Sensitive     Extended ESBL NEGATIVE Sensitive     * >=100,000 COLONIES/mL ESCHERICHIA COLI  Culture, blood (routine x 2)     Status: None (Preliminary result)   Collection Time: 01/29/18  7:19 PM  Result Value Ref Range Status   Specimen Description BLOOD LEFT ANTECUBITAL  Final   Special Requests   Final    BOTTLES DRAWN AEROBIC AND ANAEROBIC Blood Culture adequate volume   Culture   Final    NO GROWTH 4 DAYS Performed at [facility], [address], [place], [place] [address]    Report Status PENDING  Incomplete  Culture, blood (routine x 2)     Status: None (Preliminary result)   Collection Time: 01/29/18  7:19 PM  Result Value Ref Range Status   Specimen Description BLOOD BLOOD LEFT HAND  Final   Special Requests   Final    BOTTLES  DRAWN AEROBIC AND ANAEROBIC Blood Culture adequate volume   Culture   Final    NO GROWTH 4 DAYS Performed at [facility], [place] [address], [place], [place] [address]    Report Status PENDING  Incomplete     Time coordinating discharge: 45  minutes  SIGNED:   Tawni Millers, MD  [facility] 02/03/2018, 11:57 AM Pager [phone]  If 7PM-7AM, please contact night-coverage [email or web address] Password [email or web address]

## 2018-02-03 NOTE — Progress Notes (Signed)
Juan Juarez wake up this morning and started acting up again that he wants to go home and smoke and take care of other businesses at home. MRI called  for scan but pt refused stating he is going home. It took the whole crew of nursing staff to explained to pt that he is  3 nursing staffs had to explained to pt that  due to his medical condition.  He is not save to go home Pt insisted leaving the unit . Security offers called  MD also notified and pt put on involuntary committment as pet is at risk and endager to self. . Pt's mother, Juan Juarez made aware of pt's  situation and risk for self harm. And agreed with the plan of care at present. . With the help of security officers at bed side, Pt was medicated and all needed care rendered,  Safety sitter now at bedside for pt safety and close monitoring.

## 2018-02-25 DIAGNOSIS — F17210 Nicotine dependence, cigarettes, uncomplicated: Secondary | ICD-10-CM | POA: Diagnosis not present

## 2018-02-25 DIAGNOSIS — Z6825 Body mass index (BMI) 25.0-25.9, adult: Secondary | ICD-10-CM | POA: Diagnosis not present

## 2018-02-25 DIAGNOSIS — R569 Unspecified convulsions: Secondary | ICD-10-CM | POA: Diagnosis not present

## 2018-02-25 DIAGNOSIS — E119 Type 2 diabetes mellitus without complications: Secondary | ICD-10-CM | POA: Diagnosis not present

## 2018-02-25 DIAGNOSIS — Z299 Encounter for prophylactic measures, unspecified: Secondary | ICD-10-CM | POA: Diagnosis not present

## 2018-02-25 DIAGNOSIS — F419 Anxiety disorder, unspecified: Secondary | ICD-10-CM | POA: Diagnosis not present

## 2018-02-25 DIAGNOSIS — Z720 Tobacco use: Secondary | ICD-10-CM | POA: Diagnosis not present

## 2018-02-28 ENCOUNTER — Ambulatory Visit (INDEPENDENT_AMBULATORY_CARE_PROVIDER_SITE_OTHER): Payer: Medicare Other | Admitting: Gastroenterology

## 2018-02-28 ENCOUNTER — Encounter: Payer: Self-pay | Admitting: Gastroenterology

## 2018-02-28 VITALS — BP 133/83 | HR 95 | Temp 98.0°F | Ht 66.0 in | Wt 160.2 lb

## 2018-02-28 DIAGNOSIS — E1165 Type 2 diabetes mellitus with hyperglycemia: Secondary | ICD-10-CM | POA: Diagnosis not present

## 2018-02-28 DIAGNOSIS — B182 Chronic viral hepatitis C: Secondary | ICD-10-CM | POA: Diagnosis not present

## 2018-02-28 NOTE — Assessment & Plan Note (Addendum)
62 year old male with history of chronic Hep C, genotype 1a. Child Pugh B and prior MELD Na earlier this year 66. He tells me he has abstained from drugs and alcohol since Jan 2019. However, positive screen for cocaine July 2019. We have recommended treatment in [place] due to higher MELD and Child Pugh B; however, transportation is an issue. I will see if there are any programs that can assist with this. In the interim, check routine labs. Further imaging after review of labs, as he is due for surveillance.  Return in Nov/Dec to arrange initial screening colonoscopy/EGD. HE WILL NEED DRUG SCREEN PRIOR. He will be seeing neurology in October 2019. Need to ensure he is stable prior to pursuing elective procedures.

## 2018-02-28 NOTE — Patient Instructions (Signed)
Please complete the blood work when you are able.  You need Hepatitis A and B vaccination through your primary care.   I will order imaging of your liver after review of blood work.  We will see you back in November to arrange colonoscopy and upper endoscopy for screening purposes.  We are looking into transportation options for you to get to [place].  I enjoyed seeing you again today! As you know, I value our relationship and want to provide genuine, compassionate, and quality care. I welcome your feedback. If you receive a survey regarding your visit,  I greatly appreciate you taking time to fill this out. See you next time!  Annitta Needs, PhD, ANP-BC [facility]
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
## 2018-03-04 NOTE — Progress Notes (Signed)
CC'D TO PCP °

## 2018-03-11 DIAGNOSIS — Z299 Encounter for prophylactic measures, unspecified: Secondary | ICD-10-CM | POA: Diagnosis not present

## 2018-03-11 DIAGNOSIS — M79602 Pain in left arm: Secondary | ICD-10-CM | POA: Diagnosis not present

## 2018-03-11 DIAGNOSIS — Z6825 Body mass index (BMI) 25.0-25.9, adult: Secondary | ICD-10-CM | POA: Diagnosis not present

## 2018-03-11 DIAGNOSIS — R569 Unspecified convulsions: Secondary | ICD-10-CM | POA: Diagnosis not present

## 2018-03-11 DIAGNOSIS — Z713 Dietary counseling and surveillance: Secondary | ICD-10-CM | POA: Diagnosis not present

## 2018-03-13 DIAGNOSIS — M25522 Pain in left elbow: Secondary | ICD-10-CM | POA: Diagnosis not present

## 2018-03-13 DIAGNOSIS — M25529 Pain in unspecified elbow: Secondary | ICD-10-CM | POA: Diagnosis not present

## 2018-03-24 DIAGNOSIS — D6959 Other secondary thrombocytopenia: Secondary | ICD-10-CM | POA: Diagnosis not present

## 2018-03-24 DIAGNOSIS — E1142 Type 2 diabetes mellitus with diabetic polyneuropathy: Secondary | ICD-10-CM | POA: Diagnosis not present

## 2018-03-24 DIAGNOSIS — G540 Brachial plexus disorders: Secondary | ICD-10-CM | POA: Diagnosis not present

## 2018-03-24 DIAGNOSIS — G40803 Other epilepsy, intractable, with status epilepticus: Secondary | ICD-10-CM | POA: Diagnosis not present

## 2018-04-17 DIAGNOSIS — M10022 Idiopathic gout, left elbow: Secondary | ICD-10-CM | POA: Diagnosis not present

## 2018-04-17 DIAGNOSIS — G540 Brachial plexus disorders: Secondary | ICD-10-CM | POA: Diagnosis not present

## 2018-04-17 DIAGNOSIS — E1142 Type 2 diabetes mellitus with diabetic polyneuropathy: Secondary | ICD-10-CM | POA: Diagnosis not present

## 2018-04-22 ENCOUNTER — Ambulatory Visit: Payer: Medicare Other | Admitting: Gastroenterology

## 2018-05-15 ENCOUNTER — Emergency Department (HOSPITAL_COMMUNITY)
Admission: EM | Admit: 2018-05-15 | Discharge: 2018-05-15 | Disposition: A | Discharge status: Home / Self Care | Payer: Medicare Other | Attending: Emergency Medicine | Admitting: Emergency Medicine

## 2018-05-15 ENCOUNTER — Encounter (HOSPITAL_COMMUNITY): Payer: Self-pay | Admitting: Emergency Medicine

## 2018-05-15 ENCOUNTER — Emergency Department (HOSPITAL_COMMUNITY): Payer: Medicare Other

## 2018-05-15 ENCOUNTER — Other Ambulatory Visit: Payer: Self-pay

## 2018-05-15 DIAGNOSIS — L02414 Cutaneous abscess of left upper limb: Secondary | ICD-10-CM | POA: Diagnosis not present

## 2018-05-15 DIAGNOSIS — E1142 Type 2 diabetes mellitus with diabetic polyneuropathy: Secondary | ICD-10-CM | POA: Diagnosis not present

## 2018-05-15 DIAGNOSIS — M25522 Pain in left elbow: Secondary | ICD-10-CM | POA: Diagnosis not present

## 2018-05-15 DIAGNOSIS — F329 Major depressive disorder, single episode, unspecified: Secondary | ICD-10-CM | POA: Diagnosis not present

## 2018-05-15 DIAGNOSIS — M10022 Idiopathic gout, left elbow: Secondary | ICD-10-CM | POA: Diagnosis not present

## 2018-05-15 DIAGNOSIS — L0291 Cutaneous abscess, unspecified: Secondary | ICD-10-CM

## 2018-05-15 DIAGNOSIS — I10 Essential (primary) hypertension: Secondary | ICD-10-CM | POA: Insufficient documentation

## 2018-05-15 DIAGNOSIS — F17210 Nicotine dependence, cigarettes, uncomplicated: Secondary | ICD-10-CM | POA: Diagnosis not present

## 2018-05-15 DIAGNOSIS — E119 Type 2 diabetes mellitus without complications: Secondary | ICD-10-CM | POA: Diagnosis not present

## 2018-05-15 DIAGNOSIS — F419 Anxiety disorder, unspecified: Secondary | ICD-10-CM | POA: Diagnosis not present

## 2018-05-15 DIAGNOSIS — D6959 Other secondary thrombocytopenia: Secondary | ICD-10-CM | POA: Diagnosis not present

## 2018-05-15 DIAGNOSIS — L03114 Cellulitis of left upper limb: Secondary | ICD-10-CM | POA: Diagnosis not present

## 2018-05-15 LAB — COMPREHENSIVE METABOLIC PANEL
ALBUMIN: 2.3 g/dL — AB (ref 3.5–5.0)
ALT: 34 U/L (ref 0–44)
ANION GAP: 10 (ref 5–15)
AST: 54 U/L — AB (ref 15–41)
Alkaline Phosphatase: 79 U/L (ref 38–126)
BUN: 13 mg/dL (ref 8–23)
CHLORIDE: 95 mmol/L — AB (ref 98–111)
CO2: 22 mmol/L (ref 22–32)
Calcium: 8.2 mg/dL — ABNORMAL LOW (ref 8.9–10.3)
Creatinine, Ser: 0.83 mg/dL (ref 0.61–1.24)
GFR calc Af Amer: >60 mL/min (ref >60)
GFR calc non Af Amer: >60 mL/min (ref >60)
Glucose, Bld: 425 mg/dL — ABNORMAL HIGH (ref 70–99)
POTASSIUM: 4.5 mmol/L (ref 3.5–5.1)
Sodium: 127 mmol/L — ABNORMAL LOW (ref 135–145)
Total Bilirubin: 1.1 mg/dL (ref 0.3–1.2)
Total Protein: 7.2 g/dL (ref 6.5–8.1)

## 2018-05-15 LAB — C-REACTIVE PROTEIN: CRP: <0.8 mg/dL (ref <1.0)

## 2018-05-15 LAB — CBC
HCT: 38 % — ABNORMAL LOW (ref 39.0–52.0)
Hemoglobin: 13.1 g/dL (ref 13.0–17.0)
MCH: 32.4 pg (ref 26.0–34.0)
MCHC: 34.5 g/dL (ref 30.0–36.0)
MCV: 94.1 fL (ref 80.0–100.0)
NRBC: 0 % (ref 0.0–0.2)
PLATELETS: 120 10*3/uL — AB (ref 150–400)
RBC: 4.04 MIL/uL — ABNORMAL LOW (ref 4.22–5.81)
RDW: 14.6 % (ref 11.5–15.5)
WBC: 5.5 10*3/uL (ref 4.0–10.5)

## 2018-05-15 LAB — SEDIMENTATION RATE: SED RATE: 56 mm/h — AB (ref 0–16)

## 2018-05-15 MED ORDER — CEPHALEXIN 500 MG PO CAPS: 500.0000 mg | ORAL_CAPSULE | Freq: Three times a day (TID) | ORAL | 0 refills | Status: AC

## 2018-05-15 MED ORDER — OXYCODONE HCL 5 MG PO TABS
5.0000 mg | ORAL_TABLET | Freq: Once | ORAL | Status: AC
  Administered 2018-05-15: 5 mg via ORAL
  Filled 2018-05-15: qty 1

## 2018-05-15 MED ORDER — INSULIN ASPART 100 UNIT/ML ~~LOC~~ SOLN
2.0000 [IU] | Freq: Once | SUBCUTANEOUS | Status: AC
  Administered 2018-05-15: 2 [IU] via SUBCUTANEOUS
  Filled 2018-05-15: qty 1

## 2018-05-15 MED ORDER — SULFAMETHOXAZOLE-TRIMETHOPRIM 800-160 MG PO TABS: 1.0000 | ORAL_TABLET | Freq: Two times a day (BID) | ORAL | 0 refills | Status: AC

## 2018-05-15 NOTE — ED Triage Notes (Signed)
Pt here for gout in his LT arm. States that it has been bothering him for 5 months. Pt also c/o abscess to LT elbow. Reports redness to area, but no fever/chills.

## 2018-05-15 NOTE — Discharge Instructions (Addendum)
You were evaluated in the Emergency Department and after careful evaluation, we did not find any emergent condition requiring admission or further testing in the hospital.  Your symptoms today seem to be due to an abscess or infection of the skin and soft tissues, which is now actively draining.  This does not seem to be related to your several months of pain in the elbow and decreased range of motion.  Please take the antibiotics provided as directed and follow-up with the orthopedic surgeons.   Please return to the Emergency Department if you experience any worsening of your condition.  We encourage you to follow up with a primary care provider.  Thank you for allowing [place] to be a part of your care.

## 2018-05-15 NOTE — ED Provider Notes (Signed)
[facility] Emergency Department Provider Note MRN:  [id number]  Arrival date & time: 05/15/18     Chief Complaint   Arm Pain   History of Present Illness   Juan Juarez is a 62 y.o. year-old male with a history of diabetes, substance abuse, hepatitis C, gout presenting to the ED with chief complaint of elbow pain.  Pain is located in the left elbow.  The pain is been present for 5 months.  Became worse 2 to 3 days ago.  Noticed a wound or boil on the elbow 2 days ago, which "came to ahead".  Has been expressing watery purulent material.  Patient denies fever, no cough, no headache or vision change, no chest pain or shortness of breath.  Endorsing swelling and pain to the left arm.  Denies any IV drug use for the past 4 months.  Review of Systems  A complete 10 system review of systems was obtained and all systems are negative except as noted in the HPI and PMH.   Patient's Health History    Past Medical History:  Diagnosis Date  . Anxiety   . Depression   . Diabetes mellitus, type II ([place])   . Gout   . Hepatitis C without hepatic coma   . Hypertension   . Insomnia   . Non compliance w medication regimen   . Osteoarthritis   . Polysubstance abuse ([place])   . Protein-calorie malnutrition, severe ([place]) 09/13/2016  . Thrombocytopenia ([place])     Past Surgical History:  Procedure Laterality Date  . BURR HOLE Right 01/22/2018   Procedure: RIGHT BURR HOLES;  Surgeon: Newman Pies, MD;  Location: [facility];  Service: Neurosurgery;  Laterality: Right;  . MULTIPLE TOOTH EXTRACTIONS    . None      Family History  Problem Relation Age of Onset  . Thyroid disease Sister   . Colon cancer Neg Hx   . Colon polyps Neg Hx     Social History   Socioeconomic History  . Marital status: Divorced    Spouse name: Not on file  . Number of children: Not on file  . Years of education: Not on file  . Highest education level: Not on file  Occupational History  . Occupation:  disabled  Social Needs  . Financial resource strain: Not on file  . Food insecurity:    Worry: Not on file    Inability: Not on file  . Transportation needs:    Medical: Not on file    Non-medical: Not on file  Tobacco Use  . Smoking status: Current Every Day Smoker    Packs/day: 0.50    Years: 50.00    Pack years: 25.00    Types: Cigarettes  . Smokeless tobacco: Never Used  Substance and Sexual Activity  . Alcohol use: Not Currently    Frequency: Never  . Drug use: Not Currently    Types: Marijuana, Cocaine    Comment: denied 05/15/18  . Sexual activity: Not on file  Lifestyle  . Physical activity:    Days per week: Not on file    Minutes per session: Not on file  . Stress: Not on file  Relationships  . Social connections:    Talks on phone: Not on file    Gets together: Not on file    Attends religious service: Not on file    Active member of club or organization: Not on file    Attends meetings of clubs or organizations: Not  on file    Relationship status: Not on file  . Intimate partner violence:    Fear of current or ex partner: Not on file    Emotionally abused: Not on file    Physically abused: Not on file    Forced sexual activity: Not on file  Other Topics Concern  . Not on file  Social History Narrative  . Not on file     Physical Exam  Vital Signs and Nursing Notes reviewed Vitals:   05/15/18 1730 05/15/18 1800  BP: 129/82 136/86  Pulse: 91 92  Resp:    Temp:    SpO2: 97% 95%    CONSTITUTIONAL: Chronically ill-appearing, NAD NEURO:  Alert and oriented x 3, no focal deficits EYES:  eyes equal and reactive ENT/NECK:  no LAD, no JVD CARDIO: Tachycardic rate, well-perfused, normal S1 and S2 PULM:  CTAB no wheezing or rhonchi GI/GU:  normal bowel sounds, non-distended, non-tender MSK/SPINE:  No gross deformities, no edema SKIN:  no rash, atraumatic PSYCH:  Appropriate speech and behavior  Diagnostic and Interventional Summary    Labs  Reviewed  CBC - Abnormal; Notable for the following components:      Result Value   RBC 4.04 (*)    HCT 38.0 (*)    Platelets 120 (*)    All other components within normal limits  COMPREHENSIVE METABOLIC PANEL - Abnormal; Notable for the following components:   Sodium 127 (*)    Chloride 95 (*)    Glucose, Bld 425 (*)    Calcium 8.2 (*)    Albumin 2.3 (*)    AST 54 (*)    All other components within normal limits  SEDIMENTATION RATE - Abnormal; Notable for the following components:   Sed Rate 56 (*)    All other components within normal limits  BODY FLUID CULTURE  C-REACTIVE PROTEIN    DG Elbow 2 Views Left  Final Result      Medications  oxyCODONE (Oxy IR/ROXICODONE) immediate release tablet 5 mg (5 mg Oral Given 05/15/18 1654)  insulin aspart (novoLOG) injection 2 Units (2 Units Subcutaneous Given 05/15/18 1901)     Procedures  EMERGENCY DEPARTMENT US SOFT TISSUE INTERPRETATION "Study: Limited Soft Tissue Ultrasound"  INDICATIONS: Soft tissue infection Multiple views of the body part were obtained in real-time with a multi-frequency linear probe  PERFORMED BY: Myself IMAGES ARCHIVED?: Yes SIDE:Left BODY PART:Elbow INTERPRETATION:  No evidence of joint effusion, no evidence of abscess or bursitis.  No evidence of walled off or retained fluid or abscess.    Critical Care  ED Course and Medical Decision Making  I have reviewed the triage vital signs and the nursing notes.  Pertinent labs & imaging results that were available during my care of the patient were reviewed by me and considered in my medical decision making (see below for details).  Question of soft tissue abscess versus septic joint versus bursitis versus gout in this 62 year old male with a history of gout.  History of IV drug use but no IV drug use for the past 4 months.  No murmur heard on exam.  Afebrile here.  Vital signs stable.  Was able to collect drainage from the wound, will send to lab.  Labs  and x-ray pending, will likely ultrasound at bedside to determine need for joint aspiration.  Labs reveal normal white count, mild to moderately elevated ESR.  Patient explains that his elbow pain and his decreased range of motion have been constant for 5  months.  The wound is the only thing that is new, which is small, already actively draining.  Favoring superficial abscess given the ultrasound findings above.  Septic bursitis still possible but thought to be unlikely.  Given the lack of joint effusion, very low concern for septic joint.  Patient without fever or systemic symptoms, appropriate for outpatient management.  Prescription for Keflex and Bactrim, referred to orthopedic surgery given the chronic issues with his left shoulder.  After the discussed management above, the patient was determined to be safe for discharge.  The patient was in agreement with this plan and all questions regarding their care were answered.  ED return precautions were discussed and the patient will return to the ED with any significant worsening of condition.  Barth Kirks. [place] Small, [place] [email or web address]  Final Clinical Impressions(s) / ED Diagnoses     ICD-10-CM   1. Left elbow pain M25.522   2. Abscess L02.91     ED Discharge Orders         Ordered    sulfamethoxazole-trimethoprim (BACTRIM DS,SEPTRA DS) 800-160 MG tablet  2 times daily     05/15/18 1906    cephALEXin (KEFLEX) 500 MG capsule  3 times daily     05/15/18 1906             Maudie Flakes, MD 05/15/18 [phone]

## 2018-05-16 LAB — CBG MONITORING, ED: GLUCOSE-CAPILLARY: 304 mg/dL — AB (ref 70–99)

## 2018-05-19 ENCOUNTER — Telehealth: Payer: Self-pay | Admitting: *Deleted

## 2018-05-19 LAB — BODY FLUID CULTURE

## 2018-05-19 NOTE — Telephone Encounter (Signed)
Post ED Visit - Positive Culture Follow-up  Culture report reviewed by antimicrobial stewardship pharmacist:  []  Elenor Quinones, Pharm.D. []  Heide Guile, Pharm.D., BCPS AQ-ID []  Parks Neptune, Pharm.D., BCPS []  Alycia Rossetti, Pharm.D., BCPS []  [place], Pharm.D., BCPS, AAHIVP []  Legrand Como, Pharm.D., BCPS, AAHIVP [x]  Salome Arnt, PharmD, BCPS []  Johnnette Gourd, PharmD, BCPS []  Hughes Better, PharmD, BCPS []  Leeroy Cha, PharmD  Positive body fluid culture, reviewed by Okey Regal, PA Treated with Sulfamethoxazole-Trimethoprim and Cephalexin and no further patient follow-up is required at this time.  Harlon Flor [facility] 05/19/2018, 11:28 AM

## 2018-05-20 ENCOUNTER — Telehealth: Payer: Self-pay | Admitting: Emergency Medicine

## 2018-05-20 NOTE — Telephone Encounter (Signed)
Post ED Visit - Positive Culture Follow-up  Culture report reviewed by antimicrobial stewardship pharmacist:  []  Elenor Quinones, Pharm.D. []  Heide Guile, Pharm.D., BCPS AQ-ID []  Parks Neptune, Pharm.D., BCPS []  Alycia Rossetti, Pharm.D., BCPS []  [place], Pharm.D., BCPS, AAHIVP []  Legrand Como, Pharm.D., BCPS, AAHIVP [x]  Salome Arnt, PharmD, BCPS []  Johnnette Gourd, PharmD, BCPS []  Hughes Better, PharmD, BCPS []  Leeroy Cha, PharmD  Positive body fluid culture Treated with sulfamethoxazole-trimethoprim, organism sensitive to the same and no further patient follow-up is required at this time.  Catharina Pica 05/20/2018, 11:01 AM

## 2018-06-05 DIAGNOSIS — M25529 Pain in unspecified elbow: Secondary | ICD-10-CM | POA: Diagnosis not present

## 2018-06-05 DIAGNOSIS — Z6826 Body mass index (BMI) 26.0-26.9, adult: Secondary | ICD-10-CM | POA: Diagnosis not present

## 2018-06-05 DIAGNOSIS — Z299 Encounter for prophylactic measures, unspecified: Secondary | ICD-10-CM | POA: Diagnosis not present

## 2018-06-05 DIAGNOSIS — R569 Unspecified convulsions: Secondary | ICD-10-CM | POA: Diagnosis not present

## 2018-06-11 DIAGNOSIS — Z6826 Body mass index (BMI) 26.0-26.9, adult: Secondary | ICD-10-CM | POA: Diagnosis not present

## 2018-06-11 DIAGNOSIS — R569 Unspecified convulsions: Secondary | ICD-10-CM | POA: Diagnosis not present

## 2018-06-11 DIAGNOSIS — Z299 Encounter for prophylactic measures, unspecified: Secondary | ICD-10-CM | POA: Diagnosis not present

## 2018-06-11 DIAGNOSIS — F17210 Nicotine dependence, cigarettes, uncomplicated: Secondary | ICD-10-CM | POA: Diagnosis not present

## 2018-06-11 DIAGNOSIS — K7460 Unspecified cirrhosis of liver: Secondary | ICD-10-CM | POA: Diagnosis not present

## 2018-06-11 DIAGNOSIS — M79602 Pain in left arm: Secondary | ICD-10-CM | POA: Diagnosis not present

## 2018-06-12 ENCOUNTER — Telehealth: Payer: Self-pay

## 2018-06-12 NOTE — Telephone Encounter (Signed)
Pt has an appointment this Tuesday 06/17/18 and turned in his script for hep A & B vaccine to his pharmacy. [facility] said the script is too old and he wouldn't be able to get it done by his appointment. Would you like to update RX when pt comes in for his appointment?

## 2018-06-12 NOTE — Telephone Encounter (Signed)
Sure, that's fine.

## 2018-06-12 NOTE — Telephone Encounter (Signed)
Noted  

## 2018-06-17 ENCOUNTER — Telehealth: Payer: Self-pay | Admitting: *Deleted

## 2018-06-17 ENCOUNTER — Other Ambulatory Visit: Payer: Self-pay | Admitting: *Deleted

## 2018-06-17 ENCOUNTER — Ambulatory Visit (INDEPENDENT_AMBULATORY_CARE_PROVIDER_SITE_OTHER): Payer: Medicare Other | Admitting: Gastroenterology

## 2018-06-17 ENCOUNTER — Encounter: Payer: Self-pay | Admitting: *Deleted

## 2018-06-17 ENCOUNTER — Encounter: Payer: Self-pay | Admitting: Gastroenterology

## 2018-06-17 VITALS — BP 122/71 | HR 78 | Temp 98.3°F | Ht 67.0 in | Wt 161.8 lb

## 2018-06-17 DIAGNOSIS — K7460 Unspecified cirrhosis of liver: Secondary | ICD-10-CM

## 2018-06-17 DIAGNOSIS — Z1211 Encounter for screening for malignant neoplasm of colon: Secondary | ICD-10-CM

## 2018-06-17 DIAGNOSIS — F1910 Other psychoactive substance abuse, uncomplicated: Secondary | ICD-10-CM

## 2018-06-17 DIAGNOSIS — B182 Chronic viral hepatitis C: Secondary | ICD-10-CM

## 2018-06-17 MED ORDER — PEG 3350-KCL-NA BICARB-NACL 420 G PO SOLR: 4000.0000 mL | Freq: Once | ORAL | 0 refills | Status: AC

## 2018-06-17 NOTE — Telephone Encounter (Signed)
Pre-op scheduled for 09/02/18 at 12:45pm. Patient was still here in the office and letter provided to him

## 2018-06-17 NOTE — Patient Instructions (Signed)
We are arranging a colonoscopy and upper endoscopy with Dr. Oneida Alar. You will have a drug screen prior.   We are also ordering an ultrasound to check your liver.   I am trying to get Home Health back to see you.   We will see you in 3 months!  I enjoyed seeing you again today! As you know, I value our relationship and want to provide genuine, compassionate, and quality care. I welcome your feedback. If you receive a survey regarding your visit,  I greatly appreciate you taking time to fill this out. See you next time!  Annitta Needs, PhD, ANP-BC [facility]

## 2018-06-17 NOTE — Telephone Encounter (Signed)
Neldon Newport, CMA        Good afternoon Lynnette Pote,   Thank you for this referral.   Unfortunately, our Care Management Team will not be able to assist you with this patient.   The patient is not on the current member enrollment roster for any of the [facility] risk contracted plans. The payer plans send [place] this roster regularly and we have checked the list for this member.   Please call us if you need further clarification at [phone].   Thank you,   Darvin Neighbours  [facility] Care Management Assistant    FYI to AB

## 2018-06-17 NOTE — Progress Notes (Signed)
Primary Care Physician:  Glenda Chroman, MD Primary GI: Dr. Oneida Alar   Chief Complaint  Patient presents with  . Hepatitis C    HPI:   Juan Juarez is a 62 y.o. male presenting today with a history of chronic Hep C, genotype 1a. Child Pugh B and prior MELD NA earlier this year was 5. Recommending treatment in [place] due to higher MELD and Child Pugh B, but transportation remains an issue. Needs initial screening colonoscopy and EGD with drug screen prior. He is due for ultrasound.   Still waiting on court date, so he doesn't want to start treatment until he knows outcome so he doesn't miss any doses.   "Slipped up" with cocaine a few weeks ago. No IV drugs. No alcohol in 9-10 months. No abdominal pain, N/V, changes in bowel habits, rectal bleeding.   Asked what would happen if he didn't do anything for his health, such as screenings, blood work, Hepatitis C treatment.     Past Medical History:  Diagnosis Date  . Anxiety   . Depression   . Diabetes mellitus, type II ([place])   . Gout   . Hepatitis C without hepatic coma   . Hypertension   . Insomnia   . Non compliance w medication regimen   . Osteoarthritis   . Polysubstance abuse ([place])   . Protein-calorie malnutrition, severe ([place]) 09/13/2016  . Thrombocytopenia ([place])     Past Surgical History:  Procedure Laterality Date  . BURR HOLE Right 01/22/2018   Procedure: RIGHT BURR HOLES;  Surgeon: Newman Pies, MD;  Location: [facility];  Service: Neurosurgery;  Laterality: Right;  . MULTIPLE TOOTH EXTRACTIONS    . None      Current Outpatient Medications  Medication Sig Dispense Refill  . doxepin (SINEQUAN) 25 MG capsule Take 25 mg by mouth at bedtime as needed (sleep and anxiety).    Marland Kitchen glipiZIDE (GLUCOTROL) 5 MG tablet Take 1 tablet by mouth daily.    Marland Kitchen HYDROcodone-acetaminophen (NORCO/VICODIN) 5-325 MG tablet Take 1 tablet by mouth as needed for moderate pain.    Marland Kitchen LEVEMIR 100 UNIT/ML injection Inject 60 Units as  directed daily.    . metFORMIN (GLUCOPHAGE) 1000 MG tablet Take 1 tablet (1,000 mg total) by mouth 2 (two) times daily with a meal. 60 tablet 1   No current facility-administered medications for this visit.     Allergies as of 06/17/2018  . (No Known Allergies)    Family History  Problem Relation Age of Onset  . Thyroid disease Sister   . Colon cancer Neg Hx   . Colon polyps Neg Hx     Social History   Socioeconomic History  . Marital status: Divorced    Spouse name: Not on file  . Number of children: Not on file  . Years of education: Not on file  . Highest education level: Not on file  Occupational History  . Occupation: disabled  Social Needs  . Financial resource strain: Not on file  . Food insecurity:    Worry: Not on file    Inability: Not on file  . Transportation needs:    Medical: Not on file    Non-medical: Not on file  Tobacco Use  . Smoking status: Current Every Day Smoker    Packs/day: 0.50    Years: 50.00    Pack years: 25.00    Types: Cigarettes  . Smokeless tobacco: Never Used  Substance and Sexual Activity  . Alcohol use: Not  Currently    Frequency: Never  . Drug use: Yes    Types: Marijuana, Cocaine    Comment: 06/17/18-cocaine about a month ago  . Sexual activity: Not on file  Lifestyle  . Physical activity:    Days per week: Not on file    Minutes per session: Not on file  . Stress: Not on file  Relationships  . Social connections:    Talks on phone: Not on file    Gets together: Not on file    Attends religious service: Not on file    Active member of club or organization: Not on file    Attends meetings of clubs or organizations: Not on file    Relationship status: Not on file  Other Topics Concern  . Not on file  Social History Narrative  . Not on file    Review of Systems: Gen: Denies fever, chills, anorexia. Denies fatigue, weakness, weight loss.  CV: Denies chest pain, palpitations, syncope, peripheral edema, and  claudication. Resp: Denies dyspnea at rest, cough, wheezing, coughing up blood, and pleurisy. GI: see HPI  Derm: Denies rash, itching, dry skin Psych: Denies depression, anxiety, memory loss, confusion. No homicidal or suicidal ideation.  Heme: Denies bruising, bleeding, and enlarged lymph nodes.  Physical Exam: BP 122/71   Pulse 78   Temp 98.3 F (36.8 C) (Oral)   Ht 5\' 7"  (1.702 m)   Wt 161 lb 12.8 oz (73.4 kg)   BMI 25.34 kg/m  General:   Alert and oriented. No distress noted. Pleasant and cooperative.  Head:  Normocephalic and atraumatic. Eyes:  Conjuctiva clear without scleral icterus. Mouth:  Oral mucosa pink and moist.  Lungs: clear to auscultation bilaterally Cardiac: S1 S2 present without murmurs  Abdomen:  +BS, soft, non-tender and non-distended. No rebound or guarding. No HSM or masses noted. Msk:  Symmetrical without gross deformities. Normal posture. Extremities:  Without edema. Neurologic:  Alert and  oriented x4 Psych:  Alert and cooperative. Normal mood and affect.

## 2018-06-23 DIAGNOSIS — M10022 Idiopathic gout, left elbow: Secondary | ICD-10-CM | POA: Diagnosis not present

## 2018-06-23 DIAGNOSIS — D6959 Other secondary thrombocytopenia: Secondary | ICD-10-CM | POA: Diagnosis not present

## 2018-06-23 DIAGNOSIS — E1142 Type 2 diabetes mellitus with diabetic polyneuropathy: Secondary | ICD-10-CM | POA: Diagnosis not present

## 2018-06-24 NOTE — Assessment & Plan Note (Addendum)
62 year old male with history of chronic Hep C, genotype 1a, Child Pugh B, prior MELD Na this year 15. Needs treatment in [place], but transportation remains in issue. Unfortunately, he is also still waiting on a court date, so treatment needs to be started when patient is able to complete without any interruption to therapy. I feel he would benefit from  [facility] as well to assess needs, as he does not have support from his significant other. Needs initial screening EGD for varices and colonoscopy, but he will need drug screen prior.   Proceed with upper endoscopy and colonoscopy in the near future with Dr. Oneida Alar. The risks, benefits, and alternatives have been discussed in detail with patient. They have stated understanding and desire to proceed.  Propofol due to polypharmacy DRUG SCREEN PRIOR US abdomen Return in 3 months for close follow-up

## 2018-06-24 NOTE — Progress Notes (Signed)
CC'D TO PCP °

## 2018-06-30 ENCOUNTER — Ambulatory Visit (HOSPITAL_COMMUNITY)
Admission: RE | Admit: 2018-06-30 | Discharge: 2018-06-30 | Disposition: A | Discharge status: Home / Self Care | Payer: Medicare Other | Source: Ambulatory Visit | Attending: Gastroenterology | Admitting: Gastroenterology

## 2018-06-30 DIAGNOSIS — K7460 Unspecified cirrhosis of liver: Secondary | ICD-10-CM | POA: Diagnosis not present

## 2018-06-30 NOTE — Progress Notes (Signed)
[place] without [place]. Repeat in 6 months.

## 2018-07-01 NOTE — Progress Notes (Signed)
Pt is aware.  

## 2018-07-10 DIAGNOSIS — M25422 Effusion, left elbow: Secondary | ICD-10-CM | POA: Diagnosis not present

## 2018-07-10 DIAGNOSIS — M25522 Pain in left elbow: Secondary | ICD-10-CM | POA: Diagnosis not present

## 2018-07-10 DIAGNOSIS — Z8739 Personal history of other diseases of the musculoskeletal system and connective tissue: Secondary | ICD-10-CM | POA: Diagnosis not present

## 2018-07-18 DIAGNOSIS — M85822 Other specified disorders of bone density and structure, left upper arm: Secondary | ICD-10-CM | POA: Diagnosis not present

## 2018-07-18 DIAGNOSIS — M25422 Effusion, left elbow: Secondary | ICD-10-CM | POA: Diagnosis not present

## 2018-07-18 DIAGNOSIS — R600 Localized edema: Secondary | ICD-10-CM | POA: Diagnosis not present

## 2018-07-30 ENCOUNTER — Telehealth: Payer: Self-pay | Admitting: Gastroenterology

## 2018-07-30 ENCOUNTER — Telehealth: Payer: Self-pay

## 2018-07-30 NOTE — Telephone Encounter (Signed)
I called and spoke to April Frazier, pt's significant other. She knows to have pt come by the office to sign Hippa form. See previous note.

## 2018-07-30 NOTE — Telephone Encounter (Signed)
T/C from April Frazier, PT's significant other, calling to let us know pt was unable to get the Hep B vaccinations due to insurance.  She is aware that I have paperwork to do for it, but I told her that pt needs to come by the office and put her on the hippa form.   She said she will have him do so. ( She said best number to call is [phone].

## 2018-07-30 NOTE — Telephone Encounter (Signed)
[phone] PLEASE CALL PATIENT ABOUT THE NEXT HEP VACCINE HE IS SUPPOSED TO GET.  HAS A QUESTION ABOUT THE INSURANCE COVERING IT

## 2018-07-30 NOTE — Telephone Encounter (Signed)
I received a PA request for the Engerix-B.  Hep A was covered and pt had it on 07/27/2018. ( Documentation placed in Anna's box).  Cover My meds is not working at this time. Will try later.

## 2018-08-01 DIAGNOSIS — F17210 Nicotine dependence, cigarettes, uncomplicated: Secondary | ICD-10-CM | POA: Diagnosis not present

## 2018-08-01 DIAGNOSIS — S069X9A Unspecified intracranial injury with loss of consciousness of unspecified duration, initial encounter: Secondary | ICD-10-CM | POA: Diagnosis not present

## 2018-08-01 DIAGNOSIS — Z6825 Body mass index (BMI) 25.0-25.9, adult: Secondary | ICD-10-CM | POA: Diagnosis not present

## 2018-08-01 DIAGNOSIS — R569 Unspecified convulsions: Secondary | ICD-10-CM | POA: Diagnosis not present

## 2018-08-01 DIAGNOSIS — D696 Thrombocytopenia, unspecified: Secondary | ICD-10-CM | POA: Diagnosis not present

## 2018-08-01 DIAGNOSIS — Z299 Encounter for prophylactic measures, unspecified: Secondary | ICD-10-CM | POA: Diagnosis not present

## 2018-08-01 DIAGNOSIS — M79602 Pain in left arm: Secondary | ICD-10-CM | POA: Diagnosis not present

## 2018-08-05 NOTE — Telephone Encounter (Signed)
Submitted request for the PA for Hep B vaccination.

## 2018-08-06 DIAGNOSIS — M1990 Unspecified osteoarthritis, unspecified site: Secondary | ICD-10-CM | POA: Diagnosis not present

## 2018-08-06 DIAGNOSIS — M25422 Effusion, left elbow: Secondary | ICD-10-CM | POA: Diagnosis not present

## 2018-08-06 NOTE — Telephone Encounter (Signed)
PT is aware and I am mailing him a copy to take with him when he goes for the vaccination.

## 2018-08-06 NOTE — Telephone Encounter (Signed)
Received PA for the Engerix-B suspension.  Authorized from 07/02/2018 through 08/04/2021.

## 2018-08-26 NOTE — Patient Instructions (Signed)
Juan Juarez  08/26/2018     @PREFPERIOPPHARMACY @   Your procedure is scheduled on   09/09/2018  Report to [name] at  1130   A.M.  Call this number if you have problems the morning of surgery:  [phone]   Remember:  Follow the diet and prep instructions given to you by Dr Nona Dell office.                     Take these medicines the morning of surgery with A SIP OF WATER  Hydrocodone () if needed). Only take 1/2 of your usual insulin dosage the night before your procedure. DO NOT take any medications for diabetes the morning of your procedure.    Do not wear jewelry, make-up or nail polish.  Do not wear lotions, powders, or perfumes, or deodorant.  Do not shave 48 hours prior to surgery.  Men may shave face and neck.  Do not bring valuables to the hospital.  [facility] is not responsible for any belongings or valuables.  Contacts, dentures or bridgework may not be worn into surgery.  Leave your suitcase in the car.  After surgery it may be brought to your room.  For patients admitted to the hospital, discharge time will be determined by your treatment team.  Patients discharged the day of surgery will not be allowed to drive home.   Name and phone number of your driver:   Family Special instructions:  None  Please read over the following fact sheets that you were given. Anesthesia Post-op Instructions and Care and Recovery After Surgery       Upper Endoscopy, Adult, Care After This sheet gives you information about how to care for yourself after your procedure. Your health care provider may also give you more specific instructions. If you have problems or questions, contact your health care provider. What can I expect after the procedure? After the procedure, it is common to have:  A sore throat.  Mild stomach pain or discomfort.  Bloating.  Nausea. Follow these instructions at home:   Follow instructions from your health care provider about  what to eat or drink after your procedure.  Return to your normal activities as told by your health care provider. Ask your health care provider what activities are safe for you.  Take over-the-counter and prescription medicines only as told by your health care provider.  Do not drive for 24 hours if you were given a sedative during your procedure.  Keep all follow-up visits as told by your health care provider. This is important. Contact a health care provider if you have:  A sore throat that lasts longer than one day.  Trouble swallowing. Get help right away if:  You vomit blood or your vomit looks like coffee grounds.  You have: ? A fever. ? Bloody, black, or tarry stools. ? A severe sore throat or you cannot swallow. ? Difficulty breathing. ? Severe pain in your chest or abdomen. Summary  After the procedure, it is common to have a sore throat, mild stomach discomfort, bloating, and nausea.  Do not drive for 24 hours if you were given a sedative during the procedure.  Follow instructions from your health care provider about what to eat or drink after your procedure.  Return to your normal activities as told by your health care provider. This information is not intended to replace advice given to you by your health care  provider. Make sure you discuss any questions you have with your health care provider. Document Released: 12/18/2011 Document Revised: 11/18/2017 Document Reviewed: 11/18/2017 Elsevier Interactive Patient Education  2019 [employer].  Colonoscopy, Adult, Care After This sheet gives you information about how to care for yourself after your procedure. Your health care provider may also give you more specific instructions. If you have problems or questions, contact your health care provider. What can I expect after the procedure? After the procedure, it is common to have:  A small amount of blood in your stool for 24 hours after the procedure.  Some  gas.  Mild abdominal cramping or bloating. Follow these instructions at home: General instructions  For the first 24 hours after the procedure: ? Do not drive or use machinery. ? Do not sign important documents. ? Do not drink alcohol. ? Do your regular daily activities at a slower pace than normal. ? Eat soft, easy-to-digest foods.  Take over-the-counter or prescription medicines only as told by your health care provider. Relieving cramping and bloating   Try walking around when you have cramps or feel bloated.  Apply heat to your abdomen as told by your health care provider. Use a heat source that your health care provider recommends, such as a moist heat pack or a heating pad. ? Place a towel between your skin and the heat source. ? Leave the heat on for 20-30 minutes. ? Remove the heat if your skin turns bright red. This is especially important if you are unable to feel pain, heat, or cold. You may have a greater risk of getting burned. Eating and drinking   Drink enough fluid to keep your urine pale yellow.  Resume your normal diet as instructed by your health care provider. Avoid heavy or fried foods that are hard to digest.  Avoid drinking alcohol for as long as instructed by your health care provider. Contact a health care provider if:  You have blood in your stool 2-3 days after the procedure. Get help right away if:  You have more than a small spotting of blood in your stool.  You pass large blood clots in your stool.  Your abdomen is swollen.  You have nausea or vomiting.  You have a fever.  You have increasing abdominal pain that is not relieved with medicine. Summary  After the procedure, it is common to have a small amount of blood in your stool. You may also have mild abdominal cramping and bloating.  For the first 24 hours after the procedure, do not drive or use machinery, sign important documents, or drink alcohol.  Contact your health care  provider if you have a lot of blood in your stool, nausea or vomiting, a fever, or increased abdominal pain. This information is not intended to replace advice given to you by your health care provider. Make sure you discuss any questions you have with your health care provider. Document Released: 01/31/2004 Document Revised: 04/10/2017 Document Reviewed: 08/30/2015 Elsevier Interactive Patient Education  2019 [place], Care After These instructions provide you with information about caring for yourself after your procedure. Your health care provider may also give you more specific instructions. Your treatment has been planned according to current medical practices, but problems sometimes occur. Call your health care provider if you have any problems or questions after your procedure. What can I expect after the procedure? After your procedure, you may:  Feel sleepy for several hours.  Feel clumsy  and have poor balance for several hours.  Feel forgetful about what happened after the procedure.  Have poor judgment for several hours.  Feel nauseous or vomit.  Have a sore throat if you had a breathing tube during the procedure. Follow these instructions at home: For at least 24 hours after the procedure:      Have a responsible adult stay with you. It is important to have someone help care for you until you are awake and alert.  Rest as needed.  Do not: ? Participate in activities in which you could fall or become injured. ? Drive. ? Use heavy machinery. ? Drink alcohol. ? Take sleeping pills or medicines that cause drowsiness. ? Make important decisions or sign legal documents. ? Take care of children on your own. Eating and drinking  Follow the diet that is recommended by your health care provider.  If you vomit, drink water, juice, or soup when you can drink without vomiting.  Make sure you have little or no nausea before eating solid  foods. General instructions  Take over-the-counter and prescription medicines only as told by your health care provider.  If you have sleep apnea, surgery and certain medicines can increase your risk for breathing problems. Follow instructions from your health care provider about wearing your sleep device: ? Anytime you are sleeping, including during daytime naps. ? While taking prescription pain medicines, sleeping medicines, or medicines that make you drowsy.  If you smoke, do not smoke without supervision.  Keep all follow-up visits as told by your health care provider. This is important. Contact a health care provider if:  You keep feeling nauseous or you keep vomiting.  You feel light-headed.  You develop a rash.  You have a fever. Get help right away if:  You have trouble breathing. Summary  For several hours after your procedure, you may feel sleepy and have poor judgment.  Have a responsible adult stay with you for at least 24 hours or until you are awake and alert. This information is not intended to replace advice given to you by your health care provider. Make sure you discuss any questions you have with your health care provider. Document Released: 10/09/2015 Document Revised: 02/01/2017 Document Reviewed: 10/09/2015 Elsevier Interactive Patient Education  2019 [employer].

## 2018-08-29 ENCOUNTER — Ambulatory Visit: Payer: Medicare Other | Admitting: Gastroenterology

## 2018-09-01 DIAGNOSIS — M25422 Effusion, left elbow: Secondary | ICD-10-CM | POA: Diagnosis not present

## 2018-09-01 DIAGNOSIS — E663 Overweight: Secondary | ICD-10-CM | POA: Diagnosis not present

## 2018-09-01 DIAGNOSIS — R768 Other specified abnormal immunological findings in serum: Secondary | ICD-10-CM | POA: Diagnosis not present

## 2018-09-01 DIAGNOSIS — Z794 Long term (current) use of insulin: Secondary | ICD-10-CM | POA: Diagnosis not present

## 2018-09-01 DIAGNOSIS — Z6826 Body mass index (BMI) 26.0-26.9, adult: Secondary | ICD-10-CM | POA: Diagnosis not present

## 2018-09-01 DIAGNOSIS — E119 Type 2 diabetes mellitus without complications: Secondary | ICD-10-CM | POA: Diagnosis not present

## 2018-09-01 NOTE — Progress Notes (Deleted)
63 year old male with history of cirrhosis, chronic Hep C, genotype 1a. Child Pugh B and prior MELD NA earlier this year was 55. Recommending treatment in [place] due to higher MELD and Child Pugh B, but transportation remains an issue. Needs initial screening colonoscopy and EGD with drug screen prior, which is scheduled for 09/09/2018. [place] Dec 2019 with cirrhosis, no hepatoma. Due for surveillance in June 2020.

## 2018-09-02 ENCOUNTER — Ambulatory Visit: Payer: Medicare Other | Admitting: Gastroenterology

## 2018-09-02 ENCOUNTER — Encounter (HOSPITAL_COMMUNITY): Payer: Self-pay | Admitting: Emergency Medicine

## 2018-09-02 ENCOUNTER — Encounter (HOSPITAL_COMMUNITY)
Admission: RE | Admit: 2018-09-02 | Discharge: 2018-09-02 | Disposition: A | Discharge status: Home / Self Care | Payer: Medicare Other | Source: Ambulatory Visit | Attending: Gastroenterology | Admitting: Gastroenterology

## 2018-09-02 ENCOUNTER — Telehealth: Payer: Self-pay

## 2018-09-02 ENCOUNTER — Encounter (HOSPITAL_COMMUNITY): Payer: Self-pay

## 2018-09-02 ENCOUNTER — Observation Stay (HOSPITAL_COMMUNITY)
Admission: EM | Admit: 2018-09-02 | Discharge: 2018-09-03 | Disposition: A | Discharge status: Home / Self Care | Payer: Medicare Other | Attending: Family Medicine | Admitting: Family Medicine

## 2018-09-02 ENCOUNTER — Other Ambulatory Visit: Payer: Self-pay

## 2018-09-02 DIAGNOSIS — F1910 Other psychoactive substance abuse, uncomplicated: Secondary | ICD-10-CM | POA: Diagnosis not present

## 2018-09-02 DIAGNOSIS — M109 Gout, unspecified: Secondary | ICD-10-CM | POA: Diagnosis not present

## 2018-09-02 DIAGNOSIS — E1165 Type 2 diabetes mellitus with hyperglycemia: Principal | ICD-10-CM

## 2018-09-02 DIAGNOSIS — Z9119 Patient's noncompliance with other medical treatment and regimen: Secondary | ICD-10-CM

## 2018-09-02 DIAGNOSIS — E1100 Type 2 diabetes mellitus with hyperosmolarity without nonketotic hyperglycemic-hyperosmolar coma (NKHHC): Secondary | ICD-10-CM

## 2018-09-02 DIAGNOSIS — E860 Dehydration: Secondary | ICD-10-CM | POA: Diagnosis not present

## 2018-09-02 DIAGNOSIS — M25522 Pain in left elbow: Secondary | ICD-10-CM | POA: Diagnosis not present

## 2018-09-02 DIAGNOSIS — K7460 Unspecified cirrhosis of liver: Secondary | ICD-10-CM | POA: Diagnosis not present

## 2018-09-02 DIAGNOSIS — X58XXXA Exposure to other specified factors, initial encounter: Secondary | ICD-10-CM | POA: Insufficient documentation

## 2018-09-02 DIAGNOSIS — G4700 Insomnia, unspecified: Secondary | ICD-10-CM | POA: Diagnosis not present

## 2018-09-02 DIAGNOSIS — Z794 Long term (current) use of insulin: Secondary | ICD-10-CM | POA: Diagnosis not present

## 2018-09-02 DIAGNOSIS — F17210 Nicotine dependence, cigarettes, uncomplicated: Secondary | ICD-10-CM | POA: Diagnosis not present

## 2018-09-02 DIAGNOSIS — Z9114 Patient's other noncompliance with medication regimen: Secondary | ICD-10-CM | POA: Diagnosis not present

## 2018-09-02 DIAGNOSIS — M1990 Unspecified osteoarthritis, unspecified site: Secondary | ICD-10-CM | POA: Insufficient documentation

## 2018-09-02 DIAGNOSIS — B1920 Unspecified viral hepatitis C without hepatic coma: Secondary | ICD-10-CM | POA: Insufficient documentation

## 2018-09-02 DIAGNOSIS — D696 Thrombocytopenia, unspecified: Secondary | ICD-10-CM | POA: Diagnosis not present

## 2018-09-02 DIAGNOSIS — S51012A Laceration without foreign body of left elbow, initial encounter: Secondary | ICD-10-CM | POA: Diagnosis not present

## 2018-09-02 DIAGNOSIS — F329 Major depressive disorder, single episode, unspecified: Secondary | ICD-10-CM | POA: Diagnosis not present

## 2018-09-02 DIAGNOSIS — F17200 Nicotine dependence, unspecified, uncomplicated: Secondary | ICD-10-CM | POA: Diagnosis present

## 2018-09-02 DIAGNOSIS — G8929 Other chronic pain: Secondary | ICD-10-CM

## 2018-09-02 DIAGNOSIS — I10 Essential (primary) hypertension: Secondary | ICD-10-CM | POA: Diagnosis not present

## 2018-09-02 DIAGNOSIS — Z79899 Other long term (current) drug therapy: Secondary | ICD-10-CM | POA: Insufficient documentation

## 2018-09-02 DIAGNOSIS — R739 Hyperglycemia, unspecified: Secondary | ICD-10-CM

## 2018-09-02 DIAGNOSIS — E1101 Type 2 diabetes mellitus with hyperosmolarity with coma: Secondary | ICD-10-CM | POA: Diagnosis present

## 2018-09-02 DIAGNOSIS — E43 Unspecified severe protein-calorie malnutrition: Secondary | ICD-10-CM | POA: Insufficient documentation

## 2018-09-02 DIAGNOSIS — Z720 Tobacco use: Secondary | ICD-10-CM | POA: Diagnosis present

## 2018-09-02 DIAGNOSIS — F419 Anxiety disorder, unspecified: Secondary | ICD-10-CM | POA: Diagnosis not present

## 2018-09-02 DIAGNOSIS — Z91199 Patient's noncompliance with other medical treatment and regimen due to unspecified reason: Secondary | ICD-10-CM

## 2018-09-02 HISTORY — DX: Unspecified osteoarthritis, unspecified site: M19.90

## 2018-09-02 HISTORY — DX: Other chronic pain: G89.29

## 2018-09-02 HISTORY — DX: Pain in left elbow: M25.522

## 2018-09-02 LAB — URINALYSIS, ROUTINE W REFLEX MICROSCOPIC
Bilirubin Urine: NEGATIVE
Glucose, UA: >=500 mg/dL — AB
Ketones, ur: NEGATIVE mg/dL
Nitrite: NEGATIVE
Protein, ur: 30 mg/dL — AB
Specific Gravity, Urine: 1.032 — ABNORMAL HIGH (ref 1.005–1.030)
pH: 6 (ref 5.0–8.0)

## 2018-09-02 LAB — CBC
HEMATOCRIT: 39.4 % (ref 39.0–52.0)
Hemoglobin: 13.9 g/dL (ref 13.0–17.0)
MCH: 32.6 pg (ref 26.0–34.0)
MCHC: 35.3 g/dL (ref 30.0–36.0)
MCV: 92.3 fL (ref 80.0–100.0)
Platelets: 130 10*3/uL — ABNORMAL LOW (ref 150–400)
RBC: 4.27 MIL/uL (ref 4.22–5.81)
RDW: 14.4 % (ref 11.5–15.5)
WBC: 6.6 10*3/uL (ref 4.0–10.5)
nRBC: 0 % (ref 0.0–0.2)

## 2018-09-02 LAB — BASIC METABOLIC PANEL
Anion gap: 9 (ref 5–15)
BUN: 16 mg/dL (ref 8–23)
CHLORIDE: 95 mmol/L — AB (ref 98–111)
CO2: 23 mmol/L (ref 22–32)
Calcium: 8.5 mg/dL — ABNORMAL LOW (ref 8.9–10.3)
Creatinine, Ser: 0.73 mg/dL (ref 0.61–1.24)
GFR calc Af Amer: >60 mL/min (ref >60)
GFR calc non Af Amer: >60 mL/min (ref >60)
Glucose, Bld: 449 mg/dL — ABNORMAL HIGH (ref 70–99)
Potassium: 4.4 mmol/L (ref 3.5–5.1)
Sodium: 127 mmol/L — ABNORMAL LOW (ref 135–145)

## 2018-09-02 LAB — CBG MONITORING, ED
Glucose-Capillary: 502 mg/dL (ref 70–99)
Glucose-Capillary: 391 mg/dL — ABNORMAL HIGH (ref 70–99)
Glucose-Capillary: 251 mg/dL — ABNORMAL HIGH (ref 70–99)
Glucose-Capillary: 220 mg/dL — ABNORMAL HIGH (ref 70–99)
Glucose-Capillary: 179 mg/dL — ABNORMAL HIGH (ref 70–99)

## 2018-09-02 LAB — HEMOGLOBIN A1C
HEMOGLOBIN A1C: 11.5 % — AB (ref 4.8–5.6)
Mean Plasma Glucose: 283.35 mg/dL

## 2018-09-02 MED ORDER — ACETAMINOPHEN 325 MG PO TABS: 650.0000 mg | ORAL_TABLET | Freq: Four times a day (QID) | ORAL | Status: DC

## 2018-09-02 MED ORDER — ACETAMINOPHEN 325 MG PO TABS: 650.0000 mg | ORAL_TABLET | Freq: Four times a day (QID) | ORAL | Status: DC | PRN

## 2018-09-02 MED ORDER — INSULIN ASPART 100 UNIT/ML ~~LOC~~ SOLN
5.0000 [IU] | Freq: Once | SUBCUTANEOUS | Status: AC
  Administered 2018-09-02: 5 [IU] via SUBCUTANEOUS
  Filled 2018-09-02: qty 1

## 2018-09-02 MED ORDER — ONDANSETRON HCL 4 MG/2ML IJ SOLN: 4.0000 mg | Freq: Four times a day (QID) | INTRAMUSCULAR | Status: DC | PRN

## 2018-09-02 MED ORDER — INSULIN ASPART 100 UNIT/ML ~~LOC~~ SOLN
6.0000 [IU] | Freq: Three times a day (TID) | SUBCUTANEOUS | Status: DC
  Administered 2018-09-03 (×2): 6 [IU] via SUBCUTANEOUS

## 2018-09-02 MED ORDER — ONDANSETRON HCL 4 MG PO TABS: 4.0000 mg | ORAL_TABLET | Freq: Four times a day (QID) | ORAL | Status: DC | PRN

## 2018-09-02 MED ORDER — INSULIN GLARGINE 100 UNIT/ML ~~LOC~~ SOLN
15.0000 [IU] | Freq: Every day | SUBCUTANEOUS | Status: DC
  Administered 2018-09-03: 15 [IU] via SUBCUTANEOUS
  Filled 2018-09-02 (×3): qty 0.15

## 2018-09-02 MED ORDER — INSULIN ASPART 100 UNIT/ML ~~LOC~~ SOLN
0.0000 [IU] | Freq: Every day | SUBCUTANEOUS | Status: DC
  Administered 2018-09-02: 2 [IU] via SUBCUTANEOUS
  Filled 2018-09-02: qty 1

## 2018-09-02 MED ORDER — INSULIN GLARGINE 100 UNIT/ML ~~LOC~~ SOLN
20.0000 [IU] | Freq: Every day | SUBCUTANEOUS | Status: DC
  Filled 2018-09-02 (×2): qty 0.2

## 2018-09-02 MED ORDER — INSULIN REGULAR BOLUS VIA INFUSION
0.0000 [IU] | Freq: Three times a day (TID) | INTRAVENOUS | Status: DC
  Filled 2018-09-02: qty 10

## 2018-09-02 MED ORDER — OXYCODONE HCL 5 MG PO TABS
5.0000 mg | ORAL_TABLET | ORAL | Status: DC | PRN
  Administered 2018-09-02 – 2018-09-03 (×4): 5 mg via ORAL
  Filled 2018-09-02 (×4): qty 1

## 2018-09-02 MED ORDER — DEXTROSE-NACL 5-0.45 % IV SOLN: INTRAVENOUS | Status: DC

## 2018-09-02 MED ORDER — DEXTROSE 50 % IV SOLN: 25.0000 mL | INTRAVENOUS | Status: DC | PRN

## 2018-09-02 MED ORDER — INSULIN ASPART 100 UNIT/ML ~~LOC~~ SOLN: 4.0000 [IU] | Freq: Once | SUBCUTANEOUS | Status: DC

## 2018-09-02 MED ORDER — ACETAMINOPHEN 650 MG RE SUPP: 650.0000 mg | Freq: Four times a day (QID) | RECTAL | Status: DC

## 2018-09-02 MED ORDER — ACETAMINOPHEN 650 MG RE SUPP: 650.0000 mg | Freq: Four times a day (QID) | RECTAL | Status: DC | PRN

## 2018-09-02 MED ORDER — INSULIN ASPART 100 UNIT/ML ~~LOC~~ SOLN
0.0000 [IU] | Freq: Three times a day (TID) | SUBCUTANEOUS | Status: DC
  Administered 2018-09-03: 15 [IU] via SUBCUTANEOUS
  Administered 2018-09-03: 8 [IU] via SUBCUTANEOUS

## 2018-09-02 MED ORDER — SODIUM CHLORIDE 0.9 % IV BOLUS
1000.0000 mL | Freq: Once | INTRAVENOUS | Status: AC
  Administered 2018-09-02: 1000 mL via INTRAVENOUS

## 2018-09-02 MED ORDER — INSULIN REGULAR(HUMAN) IN NACL 100-0.9 UT/100ML-% IV SOLN
INTRAVENOUS | Status: DC
  Filled 2018-09-02: qty 100

## 2018-09-02 MED ORDER — SENNOSIDES-DOCUSATE SODIUM 8.6-50 MG PO TABS: 1.0000 | ORAL_TABLET | Freq: Every evening | ORAL | Status: DC | PRN

## 2018-09-02 MED ORDER — TRAZODONE HCL 50 MG PO TABS: 25.0000 mg | ORAL_TABLET | Freq: Every evening | ORAL | Status: DC | PRN

## 2018-09-02 MED ORDER — ENOXAPARIN SODIUM 40 MG/0.4ML ~~LOC~~ SOLN
40.0000 mg | SUBCUTANEOUS | Status: DC
  Administered 2018-09-02: 40 mg via SUBCUTANEOUS
  Filled 2018-09-02: qty 0.4

## 2018-09-02 MED ORDER — SODIUM CHLORIDE 0.9 % IV SOLN
INTRAVENOUS | Status: DC
  Administered 2018-09-02: 15:00:00 via INTRAVENOUS

## 2018-09-02 NOTE — ED Provider Notes (Signed)
[facility] EMERGENCY DEPARTMENT Provider Note   CSN: [id number] Arrival date & time: 09/02/18  1146    History   Chief Complaint Chief Complaint  Patient presents with  . Hyperglycemia    HPI Juan Juarez is a 63 y.o. male.      Hyperglycemia   Pt was seen at 1350. Per pt, c/o unknown onset and persistence of constant "high blood sugars" that was noticed on labs completed yesterday by his Rheumatologist. Pt states he has hx chronic left elbow pain "and they say I need surgery on it." Pt states his Rheumatologist "did labs and then I got called today and told my sugar was high." Pt states he takes his DM meds but does not take his CBG's at home. Pt states "maybe" the last time he checked his home CBG "6 months ago." Pt endorses increased thirst and polyuria. Denies CP/SOB, no abd pain, no N/V/D, no fevers.   Past Medical History:  Diagnosis Date  . Anxiety   . Chronic elbow pain, left   . Depression   . Diabetes mellitus, type II ([place])   . Gout   . Hepatitis C without hepatic coma   . Hypertension   . Inflammatory arthropathy    left elbow  . Insomnia   . Non compliance w medication regimen   . Osteoarthritis   . Polysubstance abuse ([place])   . Protein-calorie malnutrition, severe ([place]) 09/13/2016  . Thrombocytopenia [facility])     Patient Active Problem List   Diagnosis Date Noted  . Status epilepticus ([place]) 01/22/2018  . Macrocytic anemia 01/22/2018  . Seizure ([place]) 01/22/2018  . Subdural bleeding ([place]) 01/20/2018  . Subdural hematoma ([place]) 12/31/2017  . Chronic hepatitis C without hepatic coma ([place]) 12/31/2017  . Alcoholic cirrhosis of liver without ascites ([place]) 12/31/2017  . Acute metabolic encephalopathy 99/83/3825  . Hyperammonemia ([place]) 11/09/2017  . UTI (urinary tract infection) 11/08/2017  . Diabetic hyperosmolar non-ketotic state ([place]) 11/07/2017  . Osteoarthritis 11/07/2017  . Tobacco abuse 11/07/2017  . Hepatic cirrhosis ([place]) 10/23/2017  . Nausea and  vomiting in adult   . Hepatitis C virus infection without hepatic coma   . Splenomegaly   . Hypoalbuminemia   . Vomiting 05/01/2017  . Dehydration 05/01/2017  . Hyponatremia 05/01/2017  . Cocaine abuse ([place]) 05/01/2017  . Hyperglycemia 09/13/2016  . Polysubstance abuse ([place]) 09/13/2016  . Elevated LFTs 09/13/2016  . Protein-calorie malnutrition, severe ([place]) 09/13/2016  . Depression 09/13/2016  . Thrombocytopenia ([place]) 09/13/2016  . Diabetes mellitus with hyperglycemia ([place]) 05/13/2015  . Essential hypertension, benign 05/13/2015  . Smoker 05/13/2015  . Back pain at L4-L5 level 11/05/2013  . Neck pain 11/05/2013  . Cervical spondylosis 11/05/2013  . Spinal stenosis of lumbar region 11/05/2013    Past Surgical History:  Procedure Laterality Date  . BURR HOLE Right 01/22/2018   Procedure: RIGHT BURR HOLES;  Surgeon: Newman Pies, MD;  Location: [place];  Service: Neurosurgery;  Laterality: Right;  . MULTIPLE TOOTH EXTRACTIONS    . None          Home Medications    Prior to Admission medications   Medication Sig Start Date End Date Taking? Authorizing Provider  amitriptyline (ELAVIL) 25 MG tablet Take 25 mg by mouth at bedtime. 06/23/18   [provider]  COLCRYS 0.6 MG tablet Take 0.6 mg by mouth daily. 07/16/18   [provider]  doxepin (SINEQUAN) 25 MG capsule Take 1 capsule by mouth daily.    [provider]  glipiZIDE (GLUCOTROL) 5 MG tablet Take 5 mg by mouth daily.  12/10/17   [provider]  LEVEMIR 100 UNIT/ML injection Inject 60 Units into the skin daily.  12/07/17   [provider]  metFORMIN (GLUCOPHAGE) 500 MG tablet Take 500 mg by mouth 2 (two) times daily. 07/16/18   [provider]  OXcarbazepine (TRILEPTAL) 150 MG tablet Take 150 mg by mouth 2 (two) times daily. 06/23/18   [provider]    Family History Family History  Problem Relation Age of Onset  . Thyroid disease Sister   . Colon  cancer Neg Hx   . Colon polyps Neg Hx     Social History Social History   Tobacco Use  . Smoking status: Current Every Day Smoker    Packs/day: 0.50    Years: 50.00    Pack years: 25.00    Types: Cigarettes  . Smokeless tobacco: Never Used  Substance Use Topics  . Alcohol use: Not Currently    Frequency: Never  . Drug use: Not Currently    Types: Marijuana, Cocaine    Comment: 06/17/18-cocaine about a month ago     Allergies   Patient has no known allergies.   Review of Systems Review of Systems ROS: Statement: All systems negative except as marked or noted in the HPI; Constitutional: Negative for fever and chills. +increased thirst and polyuria.; ; Eyes: Negative for eye pain, redness and discharge. ; ; ENMT: Negative for ear pain, hoarseness, nasal congestion, sinus pressure and sore throat. ; ; Cardiovascular: Negative for chest pain, palpitations, diaphoresis, dyspnea and peripheral edema. ; ; Respiratory: Negative for cough, wheezing and stridor. ; ; Gastrointestinal: Negative for nausea, vomiting, diarrhea, abdominal pain, blood in stool, hematemesis, jaundice and rectal bleeding. . ; ; Genitourinary: Negative for dysuria, flank pain and hematuria. ; ; Musculoskeletal: +chronic elbow pain. Negative for back pain and neck pain. Negative for trauma.; ; Skin: Negative for pruritus, rash, abrasions, blisters, bruising and skin lesion.; ; Neuro: Negative for headache, lightheadedness and neck stiffness. Negative for weakness, altered level of consciousness, altered mental status, extremity weakness, paresthesias, involuntary movement, seizure and syncope.       Physical Exam Updated Vital Signs BP 111/80 (BP Location: Right Arm)   Pulse 75   Temp 98.1 F (36.7 C) (Oral)   Resp 18   Ht 5\' 7"  (1.702 m)   Wt 75.8 kg   SpO2 100%   BMI 26.16 kg/m   Physical Exam 1355; Physical examination:  Nursing notes reviewed; Vital signs and O2 SAT reviewed;  Constitutional: Well  developed, Well nourished, Well hydrated, In no acute distress; Head:  Normocephalic, atraumatic; Eyes: EOMI, PERRL, No scleral icterus; ENMT: Mouth and pharynx normal, Mucous membranes moist; Neck: Supple, Full range of motion, No lymphadenopathy; Cardiovascular: Regular rate and rhythm, No gallop; Respiratory: Breath sounds clear & equal bilaterally, No wheezes.  Speaking full sentences with ease, Normal respiratory effort/excursion; Chest: Nontender, Movement normal; Abdomen: Soft, Nontender, Nondistended, Normal bowel sounds; Genitourinary: No CVA tenderness; Extremities: Peripheral pulses normal, +ACE left elbow.  No edema, No calf edema or asymmetry.; Neuro: AA&Ox3, Major CN grossly intact.  Speech clear. No gross focal motor or sensory deficits in extremities.; Skin: Color normal, Warm, Dry.    ED Treatments / Results  Labs (all labs ordered are listed, but only abnormal results are displayed)   EKG None  Radiology   Procedures Procedures (including critical care time)  Medications Ordered in ED Medications  sodium chloride 0.9 %  bolus 1,000 mL (has no administration in time range)     Initial Impression / Assessment and Plan / ED Course  I have reviewed the triage vital signs and the nursing notes.  Pertinent labs & imaging results that were available during my care of the patient were reviewed by me and considered in my medical decision making (see chart for details).     MDM Reviewed: previous chart, nursing note and vitals Reviewed previous: labs Interpretation: labs Total time providing critical care: 30-74 minutes. This excludes time spent performing separately reportable procedures and services. Consults: admitting MD    CRITICAL CARE Performed by: Francine Graven Total critical care time: 35 minutes Critical care time was exclusive of separately billable procedures and treating other patients. Critical care was necessary to treat or prevent imminent or  life-threatening deterioration. Critical care was time spent personally by me on the following activities: development of treatment plan with patient and/or surrogate as well as nursing, discussions with consultants, evaluation of patient's response to treatment, examination of patient, obtaining history from patient or surrogate, ordering and performing treatments and interventions, ordering and review of laboratory studies, ordering and review of radiographic studies, pulse oximetry and re-evaluation of patient's condition.   Results for orders placed or performed during the hospital encounter of 02/77/41  Basic metabolic panel  Result Value Ref Range   Sodium 127 (L) 135 - 145 mmol/L   Potassium 4.4 3.5 - 5.1 mmol/L   Chloride 95 (L) 98 - 111 mmol/L   CO2 23 22 - 32 mmol/L   Glucose, Bld 449 (H) 70 - 99 mg/dL   BUN 16 8 - 23 mg/dL   Creatinine, Ser 0.73 0.61 - 1.24 mg/dL   Calcium 8.5 (L) 8.9 - 10.3 mg/dL   GFR calc non Af Amer >60 >60 mL/min   GFR calc Af Amer >60 >60 mL/min   Anion gap 9 5 - 15  CBC  Result Value Ref Range   WBC 6.6 4.0 - 10.5 K/uL   RBC 4.27 4.22 - 5.81 MIL/uL   Hemoglobin 13.9 13.0 - 17.0 g/dL   HCT 39.4 39.0 - 52.0 %   MCV 92.3 80.0 - 100.0 fL   MCH 32.6 26.0 - 34.0 pg   MCHC 35.3 30.0 - 36.0 g/dL   RDW 14.4 11.5 - 15.5 %   Platelets 130 (L) 150 - 400 K/uL   nRBC 0.0 0.0 - 0.2 %  Urinalysis, Routine w reflex microscopic  Result Value Ref Range   Color, Urine YELLOW YELLOW   APPearance CLEAR CLEAR   Specific Gravity, Urine 1.032 (H) 1.005 - 1.030   pH 6.0 5.0 - 8.0   Glucose, UA >=500 (A) NEGATIVE mg/dL   Hgb urine dipstick SMALL (A) NEGATIVE   Bilirubin Urine NEGATIVE NEGATIVE   Ketones, ur NEGATIVE NEGATIVE mg/dL   Protein, ur 30 (A) NEGATIVE mg/dL   Nitrite NEGATIVE NEGATIVE   Leukocytes,Ua SMALL (A) NEGATIVE   RBC / HPF 11-20 0 - 5 RBC/hpf   WBC, UA 11-20 0 - 5 WBC/hpf   Bacteria, UA RARE (A) NONE SEEN   Squamous Epithelial / LPF 6-10 0 - 5    CBG monitoring, ED  Result Value Ref Range   Glucose-Capillary 502 (HH) 70 - 99 mg/dL    1420:  Na corrects to 133 for elevated glucose. Pt non-compliant with CBG's; also doubt compliance with DM meds. IVF bolus given, followed by IV insulin gtt.  T/C returned from [facility] Wynetta Emery, case discussed,  including:  HPI, pertinent PM/SHx, VS/PE, dx testing, ED course and treatment:  Agreeable to admit.     Final Clinical Impressions(s) / ED Diagnoses   Final diagnoses:  None    ED Discharge Orders    None       Francine Graven, DO 09/06/18 1740

## 2018-09-02 NOTE — Telephone Encounter (Signed)
Agreed -

## 2018-09-02 NOTE — H&P (Signed)
History and Physical  Juan Juarez XFG:[id number] DOB: 1955/11/28 DOA: 09/02/2018  Referring physician: Thurnell Garbe  PCP: Glenda Chroman, MD   Chief Complaint: high blood sugar  HPI: Juan Juarez is a 63 y.o. male with reported history of insulin requiring diabetes mellitus, osteoarthritis, substance abuse, thrombocytopenia, gout, hepatic cirrhosis, hepatitis C and hypertension was sent to the ED by his rheumatologist for treatment of hyperglycemia.  He had recently seen his rheumatologist and had labs done.  He was being seen by his rheumatologist for chronic left elbow wound.  When the labs came back they noticed that his blood sugar was greater than 600 and sent him to the emergency department.  The patient reports that he has been taking his blood glucose medications as prescribed but seems to be a poor historian.  On arrival his blood sugar was greater than 500 but he was not in DKA.  He was very dehydrated.  He was started on IV fluid hydration and his blood sugar started to improve down to 400.  He is going to be admitted for management of his poorly controlled diabetes mellitus.  Review of Systems: All systems reviewed and apart from history of presenting illness, are negative.  Past Medical History:  Diagnosis Date  . Anxiety   . Chronic elbow pain, left   . Depression   . Diabetes mellitus, type II ([place])   . Gout   . Hepatitis C without hepatic coma   . Hypertension   . Inflammatory arthropathy    left elbow  . Insomnia   . Non compliance w medication regimen   . Osteoarthritis   . Polysubstance abuse ([place])   . Protein-calorie malnutrition, severe ([place]) 09/13/2016  . Thrombocytopenia ([place])    Past Surgical History:  Procedure Laterality Date  . BURR HOLE Right 01/22/2018   Procedure: RIGHT BURR HOLES;  Surgeon: Newman Pies, MD;  Location: [place];  Service: Neurosurgery;  Laterality: Right;  . MULTIPLE TOOTH EXTRACTIONS    . None     Social History:  reports that he  has been smoking cigarettes. He has a 25.00 pack-year smoking history. He has never used smokeless tobacco. He reports previous alcohol use. He reports previous drug use. Drugs: Marijuana and Cocaine.  No Known Allergies  Family History  Problem Relation Age of Onset  . Thyroid disease Sister   . Colon cancer Neg Hx   . Colon polyps Neg Hx     Prior to Admission medications   Medication Sig Start Date End Date Taking? Authorizing Provider  amitriptyline (ELAVIL) 25 MG tablet Take 25 mg by mouth at bedtime. 06/23/18   [provider]  COLCRYS 0.6 MG tablet Take 0.6 mg by mouth daily. 07/16/18   [provider]  doxepin (SINEQUAN) 25 MG capsule Take 1 capsule by mouth daily.    [provider]  glipiZIDE (GLUCOTROL) 5 MG tablet Take 5 mg by mouth daily.  12/10/17   [provider]  LEVEMIR 100 UNIT/ML injection Inject 60 Units into the skin daily.  12/07/17   [provider]  metFORMIN (GLUCOPHAGE) 500 MG tablet Take 500 mg by mouth 2 (two) times daily. 07/16/18   [provider]  OXcarbazepine (TRILEPTAL) 150 MG tablet Take 150 mg by mouth 2 (two) times daily. 06/23/18   [provider]   Physical Exam: Vitals:   09/02/18 1210  BP: 111/80  Pulse: 75  Resp: 18  Temp: 98.1 F (36.7 C)  TempSrc: Oral  SpO2:  100%  Weight: 75.8 kg  Height: 5\' 7"  (1.702 m)     General exam: Moderately built and nourished patient, lying comfortably supine on the gurney in no obvious distress.  Head, eyes and ENT: Nontraumatic and normocephalic. Pupils equally reacting to light and accommodation. Oral mucosa very dry.  Neck: Supple. No JVD, carotid bruit or thyromegaly.  Lymphatics: No lymphadenopathy.  Respiratory system: Clear to auscultation. No increased work of breathing.  Cardiovascular system: S1 and S2 heard, RRR. No JVD, murmurs, gallops, clicks or pedal edema.  Gastrointestinal system: Abdomen is nondistended, soft and  nontender. Normal bowel sounds heard. No organomegaly or masses appreciated.  Central nervous system: Alert and oriented. No focal neurological deficits.  Extremities: Symmetric 5 x 5 power. Peripheral pulses symmetrically felt.   Skin: No rashes or acute findings.  Musculoskeletal system: Swollen painful left elbow with some drainage seen.  Psychiatry: Pleasant and cooperative.  Labs on Admission:  Basic Metabolic Panel: Recent Labs  Lab 09/02/18 1324  NA 127*  K 4.4  CL 95*  CO2 23  GLUCOSE 449*  BUN 16  CREATININE 0.73  CALCIUM 8.5*   Liver Function Tests: No results for input(s): AST, ALT, ALKPHOS, BILITOT, PROT, ALBUMIN in the last 168 hours. No results for input(s): LIPASE, AMYLASE in the last 168 hours. No results for input(s): AMMONIA in the last 168 hours. CBC: Recent Labs  Lab 09/02/18 1324  WBC 6.6  HGB 13.9  HCT 39.4  MCV 92.3  PLT 130*   Cardiac Enzymes: No results for input(s): CKTOTAL, CKMB, CKMBINDEX, TROPONINI in the last 168 hours.  BNP (last 3 results) No results for input(s): PROBNP in the last 8760 hours. CBG: Recent Labs  Lab 09/02/18 1208 09/02/18 1432  GLUCAP 502* 391*    Radiological Exams on Admission: No results found.  EKG: Independently reviewed.   Assessment/Plan Principal Problem:   Type 2 diabetes mellitus with hyperglycemia, with long-term current use of insulin (HCC) Active Problems:   Essential hypertension, benign   Smoker   Hyperglycemia   Polysubstance abuse (HCC)   Protein-calorie malnutrition, severe (HCC)   Thrombocytopenia (HCC)   Dehydration   Hepatic cirrhosis (HCC)   Tobacco abuse   Hyperosmolar (nonketotic) coma ([place])   1. Uncontrolled type 2 diabetes mellitus with hyperglycemia-patient likely has been noncompliant with taking his medications and insulin.  Check hemoglobin A1c.  Continue IV fluid hydration and he has been started on basal bolus insulin.  Monitor blood glucose at least 5 times per  day.  If his blood sugars are better controlled anticipate discharging home tomorrow.  He can have outpatient follow-up with his primary care providers.  DVT Prophylaxis: Lovenox Code Status: Full Family Communication: Patient Disposition Plan: Home tomorrow  Time spent: 20 minutes  Irwin Brakeman, MD [facility] How to contact the [facility] Attending or Consulting provider [place] or covering provider during after hours [place], for this patient?  1. Check the care team in [facility] and look for a) attending/consulting [facility] provider listed and b) the [facility] team listed 2. Log into [email or web address] and use [place]'s universal password to access. If you do not have the password, please contact the hospital operator. 3. Locate the [facility] provider you are looking for under [facility] and page to a number that you can be directly reached. 4. If you still have difficulty reaching the provider, please page the [facility] (Director on Call) for the Hospitalists listed on amion for assistance.

## 2018-09-02 NOTE — Telephone Encounter (Signed)
Pt came by the office for his appt today, but said he had just been called by the Rheumatologist where he had blood work done yesterday and told that his BS was 600 yesterday ane he needed to go to the ED right away.  I spoke to Vicente Males and she said OK to cancel appt today and reschedule, he needed to have the BS addressed right away.  He had rode [employer] here and was planning to go to [facility] for his pre-op for his procedures that are scheduled for 09/09/2018.   I called his mom and she came to pick him up and take him to the hospital to get checked out.  His appt was rescheduled for 11/12/2018.

## 2018-09-02 NOTE — ED Triage Notes (Signed)
Pt had lab work yesterday and was told to come to ED d/t hyperglycemia. Pt states he has been taking all DM medications, took 60 units of Levemir. Increased thirst and polyuria noted. Pt does not check CBG.

## 2018-09-03 ENCOUNTER — Encounter (HOSPITAL_COMMUNITY): Payer: Self-pay

## 2018-09-03 ENCOUNTER — Telehealth: Payer: Self-pay | Admitting: *Deleted

## 2018-09-03 DIAGNOSIS — K7460 Unspecified cirrhosis of liver: Secondary | ICD-10-CM | POA: Diagnosis not present

## 2018-09-03 DIAGNOSIS — E860 Dehydration: Secondary | ICD-10-CM | POA: Diagnosis not present

## 2018-09-03 DIAGNOSIS — I10 Essential (primary) hypertension: Secondary | ICD-10-CM | POA: Diagnosis not present

## 2018-09-03 DIAGNOSIS — E1165 Type 2 diabetes mellitus with hyperglycemia: Secondary | ICD-10-CM | POA: Diagnosis not present

## 2018-09-03 LAB — CBC WITH DIFFERENTIAL/PLATELET
Abs Immature Granulocytes: 0.02 10*3/uL (ref 0.00–0.07)
BASOS PCT: 1 %
Basophils Absolute: 0 10*3/uL (ref 0.0–0.1)
Eosinophils Absolute: 0.1 10*3/uL (ref 0.0–0.5)
Eosinophils Relative: 2 %
HCT: 38.4 % — ABNORMAL LOW (ref 39.0–52.0)
Hemoglobin: 13 g/dL (ref 13.0–17.0)
IMMATURE GRANULOCYTES: 0 %
Lymphocytes Relative: 39 %
Lymphs Abs: 2.3 10*3/uL (ref 0.7–4.0)
MCH: 31.1 pg (ref 26.0–34.0)
MCHC: 33.9 g/dL (ref 30.0–36.0)
MCV: 91.9 fL (ref 80.0–100.0)
Monocytes Absolute: 0.6 10*3/uL (ref 0.1–1.0)
Monocytes Relative: 10 %
Neutro Abs: 2.9 10*3/uL (ref 1.7–7.7)
Neutrophils Relative %: 48 %
PLATELETS: 132 10*3/uL — AB (ref 150–400)
RBC: 4.18 MIL/uL — ABNORMAL LOW (ref 4.22–5.81)
RDW: 14.5 % (ref 11.5–15.5)
WBC: 5.9 10*3/uL (ref 4.0–10.5)
nRBC: 0 % (ref 0.0–0.2)

## 2018-09-03 LAB — COMPREHENSIVE METABOLIC PANEL
ALT: 44 U/L (ref 0–44)
AST: 70 U/L — ABNORMAL HIGH (ref 15–41)
Albumin: 2.2 g/dL — ABNORMAL LOW (ref 3.5–5.0)
Alkaline Phosphatase: 97 U/L (ref 38–126)
Anion gap: 6 (ref 5–15)
BUN: 14 mg/dL (ref 8–23)
CHLORIDE: 102 mmol/L (ref 98–111)
CO2: 23 mmol/L (ref 22–32)
Calcium: 8.3 mg/dL — ABNORMAL LOW (ref 8.9–10.3)
Creatinine, Ser: 0.63 mg/dL (ref 0.61–1.24)
GFR calc Af Amer: >60 mL/min (ref >60)
GFR calc non Af Amer: >60 mL/min (ref >60)
Glucose, Bld: 252 mg/dL — ABNORMAL HIGH (ref 70–99)
Potassium: 3.9 mmol/L (ref 3.5–5.1)
Sodium: 131 mmol/L — ABNORMAL LOW (ref 135–145)
Total Bilirubin: 0.8 mg/dL (ref 0.3–1.2)
Total Protein: 6.9 g/dL (ref 6.5–8.1)

## 2018-09-03 LAB — PHOSPHORUS: Phosphorus: 3.5 mg/dL (ref 2.5–4.6)

## 2018-09-03 LAB — HIV ANTIBODY (ROUTINE TESTING W REFLEX): HIV Screen 4th Generation wRfx: NONREACTIVE

## 2018-09-03 LAB — GLUCOSE, CAPILLARY: Glucose-Capillary: 388 mg/dL — ABNORMAL HIGH (ref 70–99)

## 2018-09-03 LAB — CBG MONITORING, ED
GLUCOSE-CAPILLARY: 245 mg/dL — AB (ref 70–99)
Glucose-Capillary: 262 mg/dL — ABNORMAL HIGH (ref 70–99)

## 2018-09-03 LAB — MAGNESIUM: Magnesium: 1.4 mg/dL — ABNORMAL LOW (ref 1.7–2.4)

## 2018-09-03 MED ORDER — MAGNESIUM SULFATE 2 GM/50ML IV SOLN
2.0000 g | Freq: Once | INTRAVENOUS | Status: AC
  Administered 2018-09-03: 2 g via INTRAVENOUS
  Filled 2018-09-03: qty 50

## 2018-09-03 MED ORDER — BLOOD GLUCOSE METER KIT: PACK | 0 refills | Status: AC

## 2018-09-03 MED ORDER — METFORMIN HCL ER 750 MG PO TB24: 750.0000 mg | ORAL_TABLET | Freq: Every day | ORAL | 0 refills | Status: DC

## 2018-09-03 MED ORDER — LEVEMIR 100 UNIT/ML ~~LOC~~ SOLN: 40.0000 [IU] | Freq: Every day | SUBCUTANEOUS | 0 refills | Status: DC

## 2018-09-03 MED ORDER — LIVING WELL WITH DIABETES BOOK
Freq: Once | Status: AC
  Administered 2018-09-03: 12:00:00

## 2018-09-03 MED ORDER — INSULIN STARTER KIT- SYRINGES (ENGLISH)
1.0000 | Freq: Once | Status: AC
  Administered 2018-09-03: 1

## 2018-09-03 NOTE — Care Management Note (Signed)
Case Management Note  Patient Details  Name: Juan Juarez MRN: [id number] Date of Birth: [birth year]  Subjective/Objective:    Uncontrolled type 2 diabetes mellitus with hyperglycemia. From home, lives with ex girl friend as a room mate. Independent. Has RW, does not use. Has PCP, uses [facility] and family for transportation. Reports he recently re -filed for his Medicaid.        Discussed his insulin, he reports he just forgets to take it sometimes and sometimes he is just lazy.          Action/Plan: There are no barriers to obtaining insulin. Discussed with patient ramifications of not taking insulin. Discussed using a timer and insulin log to be accountable. Patient agrees.  DC home today.     Expected Discharge Date:  09/03/18               Expected Discharge Plan:  Home/Self Care  In-House Referral:     Discharge planning Services  CM Consult, Medication Assistance  Post Acute Care Choice:  NA Choice offered to:  NA  DME Arranged:    DME Agency:     HH Arranged:    HH Agency:     Status of Service:  Completed, signed off  If discussed at [employer] of Stay Meetings, dates discussed:    Additional Comments:  Greggory Safranek, Chauncey Reading, RN 09/03/2018, 11:24 AM

## 2018-09-03 NOTE — Telephone Encounter (Signed)
We need to see him back in the office. He needs stricter glycemic control prior to elective procedure. Please cancel for now, and we will arrange when we see him again.

## 2018-09-03 NOTE — Progress Notes (Signed)
Name: Quantae Martel Weekly Diabetes Record   Date: Breakfast Snack Lunch Snack Dinner Snack Bedtime Night Notes   Blood Sugar            Insulin Dose            Grams Carb            Phys. Activity            Date: Breakfast Snack Lunch Snack Dinner Snack Bedtime Night Notes   Blood Sugar            Insulin Dose            Grams Carb            Phys. Activity            Date: Breakfast Snack Lunch Snack Dinner Snack Bedtime Night Notes   Blood Sugar            Insulin Dose            Grams Carb            Phys. Activity            Date: Breakfast Snack Lunch Snack Dinner Snack Bedtime Night Notes   Blood Sugar            Insulin Dose            Grams Carb            Phys. Activity            Date: Breakfast Snack Lunch Snack Dinner Snack Bedtime Night Notes   Blood Sugar            Insulin Dose            Grams Carb            Phys. Activity            Date: Breakfast Snack Lunch Snack Dinner Snack Bedtime Night Notes   Blood Sugar            Insulin Dose            Grams Carb            Phys. Activity            Date: Breakfast Snack Lunch Snack Dinner Snack Bedtime Night Notes   Blood Sugar            Insulin Dose            Grams Carb            Phys. Activity            Provided patient with the above log to write down insulin administration times and blood glucose levels. Explained to patient the importance of taking insulin routinely and checking blood glucose as prescribed.

## 2018-09-03 NOTE — Discharge Instructions (Signed)
Blood Glucose Monitoring, Adult °Monitoring your blood sugar (glucose) is an important part of managing your diabetes (diabetes mellitus). Blood glucose monitoring involves checking your blood glucose as often as directed and keeping a record (log) of your results over time. °Checking your blood glucose regularly and keeping a blood glucose log can: °· Help you and your health care provider adjust your diabetes management plan as needed, including your medicines or insulin. °· Help you understand how food, exercise, illnesses, and medicines affect your blood glucose. °· Let you know what your blood glucose is at any time. You can quickly find out if you have low blood glucose (hypoglycemia) or high blood glucose (hyperglycemia). °Your health care provider will set individualized treatment goals for you. Your goals will be based on your age, other medical conditions you have, and how you respond to diabetes treatment. Generally, the goal of treatment is to maintain the following blood glucose levels: °· Before meals (preprandial): 80-130 mg/dL (4.4-7.2 mmol/L). °· After meals (postprandial): below 180 mg/dL (10 mmol/L). °· A1c level: less than 7%. °Supplies needed: °· Blood glucose meter. °· Test strips for your meter. Each meter has its own strips. You must use the strips that came with your meter. °· A needle to prick your finger (lancet). Do not use a lancet more than one time. °· A device that holds the lancet (lancing device). °· A journal or log book to write down your results. °How to check your blood glucose ° °1. Wash your hands with soap and water. °2. Prick the side of your finger (not the tip) with the lancet. Use a different finger each time. °3. Gently rub the finger until a small drop of blood appears. °4. Follow instructions that come with your meter for inserting the test strip, applying blood to the strip, and using your blood glucose meter. °5. Write down your result and any notes. °Some meters  allow you to use areas of your body other than your finger (alternative sites) to test your blood. The most common alternative sites are: °· Forearm. °· Thigh. °· Palm of the hand. °If you think you may have hypoglycemia, or if you have a history of not knowing when your blood glucose is getting low (hypoglycemia unawareness), do not use alternative sites. Use your finger instead. Alternative sites may not be as accurate as the fingers, because blood flow is slower in these areas. This means that the result you get may be delayed, and it may be different from the result that you would get from your finger. °Follow these instructions at home: °Blood glucose log ° °· Every time you check your blood glucose, write down your result. Also write down any notes about things that may be affecting your blood glucose, such as your diet and exercise for the day. This information can help you and your health care provider: °? Look for patterns in your blood glucose over time. °? Adjust your diabetes management plan as needed. °· Check if your meter allows you to download your records to a computer. Most glucose meters store a record of glucose readings in the meter. °If you have type 1 diabetes: °· Check your blood glucose 2 or more times a day. °· Also check your blood glucose: °? Before every insulin injection. °? Before and after exercise. °? Before meals. °? 2 hours after a meal. °? Occasionally between 2:00 a.m. and 3:00 a.m., as directed. °? Before potentially dangerous tasks, like driving or using heavy machinery. °?   At bedtime.  You may need to check your blood glucose more often, up to 6-10 times a day, if you: ? Use an insulin pump. ? Need multiple daily injections (MDI). ? Have diabetes that is not well-controlled. ? Are ill. ? Have a history of severe hypoglycemia. ? Have hypoglycemia unawareness. If you have type 2 diabetes:  If you take insulin or other diabetes medicines, check your blood glucose 2 or  more times a day.  If you are on intensive insulin therapy, check your blood glucose 4 or more times a day. Occasionally, you may also need to check between 2:00 a.m. and 3:00 a.m., as directed.  Also check your blood glucose: ? Before and after exercise. ? Before potentially dangerous tasks, like driving or using heavy machinery.  You may need to check your blood glucose more often if: ? Your medicine is being adjusted. ? Your diabetes is not well-controlled. ? You are ill. General tips  Always keep your supplies with you.  If you have questions or need help, all blood glucose meters have a 24-hour "hotline" phone number that you can call. You may also contact your health care provider.  After you use a few boxes of test strips, adjust (calibrate) your blood glucose meter by following instructions that came with your meter. Contact a health care provider if:  Your blood glucose is at or above 240 mg/dL (13.3 mmol/L) for 2 days in a row.  You have been sick or have had a fever for 2 days or longer, and you are not getting better.  You have any of the following problems for more than 6 hours: ? You cannot eat or drink. ? You have nausea or vomiting. ? You have diarrhea. Get help right away if:  Your blood glucose is lower than 54 mg/dL (3 mmol/L).  You become confused or you have trouble thinking clearly.  You have difficulty breathing.  You have moderate or large ketone levels in your urine. Summary  Monitoring your blood sugar (glucose) is an important part of managing your diabetes (diabetes mellitus).  Blood glucose monitoring involves checking your blood glucose as often as directed and keeping a record (log) of your results over time.  Your health care provider will set individualized treatment goals for you. Your goals will be based on your age, other medical conditions you have, and how you respond to diabetes treatment.  Every time you check your blood glucose,  write down your result. Also write down any notes about things that may be affecting your blood glucose, such as your diet and exercise for the day. This information is not intended to replace advice given to you by your health care provider. Make sure you discuss any questions you have with your health care provider. Document Released: 06/21/2003 Document Revised: 04/29/2017 Document Reviewed: 11/28/2015 Elsevier Interactive Patient Education  2019 [place].   Insulin Treatment for Diabetes Mellitus Diabetes (diabetes mellitus) is a long-term (chronic) disease. It occurs when the body does not properly use sugar (glucose) that is released from food after digestion. Glucose levels are controlled by a hormone called insulin. Insulin is made in the pancreas, which is an organ behind the stomach.  If you have type 1 diabetes, you must take insulin because your pancreas does not make any.  If you have type 2 diabetes, you might need to take insulin along with other medicines. In type 2 diabetes, one or both of these problems may be present: ? The  pancreas does not make enough insulin. ? Cells in the body do not respond properly to insulin that the body makes (insulin resistance). You must use insulin correctly to control your diabetes. You must have some insulin in your body at all times. Insulin treatment varies depending on your type of diabetes, your treatment goals, and your medical history. Ask questions to understand your insulin treatment plan so you can be an active partner in managing your diabetes. How is insulin given? Insulin can only be given through a shot (injection). It is injected using a syringe and needle, an insulin pen, a pump, or a jet injector. Your health care provider will:  Prescribe the type and amount of insulin that you need.  Tell you when you should inject your insulin. Where on the body should insulin be injected? Insulin is injected into a layer of fatty tissue  under the skin. Good places to inject insulin include:  Abdomen. Generally, the abdomen is the best place to inject insulin. However, you should avoid any area that is less than 2 inches (5 cm) from the belly button (navel).  Front of thigh.  Upper, outer side of thigh.  Upper, outer side of arm.  Upper, outer part of buttock. It is important to:  Give your injection in a slightly different place each time. This helps to prevent irritation and improve absorption.  Avoid injecting into areas that have scar tissue. Usually, you will give yourself insulin injections. Others can also be taught how to give you injections. You will use a special type of syringe that is made only for insulin. Some people may have an insulin pump that delivers insulin steadily through a tube (cannula) that is placed under the skin. What are the different types of insulin? The following information is a general guide to different types of insulin. Specifics vary depending on the insulin product that your health care provider prescribes.  Rapid-acting insulin: ? Starts working quickly, in as little as 5 minutes. ? Can last for 4-6 hours (or sometimes longer). ? Works well when taken right before a meal to quickly lower your blood glucose.  Short-acting insulin: ? Starts working in about 30 minutes. ? Can last for 6-10 hours. ? Should be taken about 30 minutes before you start eating a meal.  Intermediate-acting insulin: ? Starts working in 1-2 hours. ? Lasts for about 10-18 hours. ? Lowers your blood glucose for a longer period of time, but it is not as effective for lowering blood glucose right after a meal.  Long-acting insulin: ? Mimics the small amount of insulin that your pancreas usually produces throughout the day. ? Should be used one or two times a day. ? Is usually used in combination with other types of insulin or other medicines.  Concentrated insulin, or U-500 insulin: ? Contains a higher  dose of insulin than most rapid-acting insulins. U-500 insulin has 5 times the amount of insulin per 1 mL. ? Should only be used with the special U-500 syringe or U-500 insulin pen. It is dangerous to use the wrong type of syringe with this insulin. What are the side effects of insulin? Possible side effects of insulin treatment include:  Low blood glucose (hypoglycemia).  Weight gain.  High blood glucose (hyperglycemia).  Skin injury or irritation. Some of these side effects can be caused by using improper injection technique. Be sure to learn how to inject insulin properly. What are common terms associated with insulin treatment? Some terms that you  might hear include:  Basal insulin, or basal rate. This is the constant amount of insulin that needs to be present in your body to keep your blood glucose levels stable. People who have type 1 diabetes need basal insulin in a steady (continuous) dose 24 hours a day. ? Usually, intermediate-acting or long-acting insulin is used one or two times a day to manage basal insulin levels. ? Medicines that are taken by mouth may also be recommended to manage basal insulin levels.  Prandial or nutrition insulin. This refers to meal-related insulin. ? Blood glucose rises quickly after a meal (postprandial). Rapid-acting or short-acting insulin can be used right before a meal (preprandial) to quickly lower your blood glucose. ? You may be instructed to adjust the amount of prandial insulin that you take, based on how much carbohydrate (starch) is in your meal.  Corrective insulin. This may also be called a correction dose or supplemental dose. This is a small amount of rapid-acting or short-acting insulin that can be used to lower your blood glucose if it is too high. You may be instructed to check your blood glucose at certain times of the day and use corrective insulin as needed.  Tight control, or intensive therapy. This means keeping your blood  glucose as close to your target as possible, and preventing your blood glucose from getting too high after meals. People who have tight control of their diabetes have fewer long-term problems caused by diabetes. Follow these instructions at home: Talk with your health care provider or pharmacist about the type of insulin you should take and when you should take it. You should know when your insulin goes up the most (peaks) and when it wears off. You need this information so you can plan your meals and exercise. Work with your health care provider to:  Check your blood glucose every day. Your health care provider will tell you how often and when you should do this.  Manage your: ? Weight. ? Blood pressure. ? Cholesterol. ? Stress.  Eat a healthy diet.  Exercise regularly. Summary  Diabetes is a long-term (chronic) disease. It occurs when the body does not properly use sugar (glucose) that is released from food after digestion. Glucose levels are controlled by a hormone called insulin, which is made in an organ behind your stomach (pancreas).  You must use insulin correctly to control your diabetes. You must have some insulin in your body at all times.  Insulin treatment varies depending on your type of diabetes, your treatment goals, and your medical history.  Talk with your health care provider or pharmacist about the type of insulin you should take and when you should take it.  Check your blood glucose every day. Your health care provider will tell you how often and when you should check it. This information is not intended to replace advice given to you by your health care provider. Make sure you discuss any questions you have with your health care provider. Document Released: 09/14/2008 Document Revised: 01/09/2017 Document Reviewed: 07/22/2015 Elsevier Interactive Patient Education  2019 [place].   Insulin Storage and Care  All insulin pens and bottles (vials) have  expiration dates. Insulin pens and vials that are refrigerated and unopened are good until the expiration date. Once opened, vials are good for 28 days. "Opened" means that the rubber has been punctured. Once in use, pen expiration dates vary depending on the type of insulin being used. Do not use insulin after the expiration date.  Always follow the instructions that come with your insulin. How to store your insulin  Store your insulin according to instructions on the packaging.  If insulin is kept at room temperature, keep the temperature between 56-4F (13-27C). ? Opened insulin vials may be kept at room temperature, or in the refrigerator and warmed to room temperature before use. ? Opened insulin pens should be kept at room temperature.  If insulin is kept in the refrigerator, keep the temperature between 36-40F (3-8C).  Do not freeze insulin.  Keep insulin away from direct heat or sunlight. How to throw away your supplies  Throw away your insulin if: ? It is discolored. ? It is thick. ? It has clumps in it. ? It has white particles suspended in it.  Discard all used needles in a puncture-proof sharps disposal container. You can ask your local pharmacy about where you can get this kind of disposal container, or you can use an empty liquid laundry detergent bottle that has a lid, for example.  Follow the disposal regulations for the area where you live.  Do not use any syringe or needle more than one time.  Throw away empty vials and pens (with needles removed) in the regular trash. General recommendations  Always keep extra insulin and supplies with you.  Always inspect your insulin prior to injecting. Do not use if you notice any discoloration, particles, or clumping.  Mix cloudy insulin by gently rolling the vial between your hands or "rocking" the pen from end to end at least 10 times.  Do not leave insulin in your vehicle or in any place where it can get too hot or  too cold.  Use an insulated travel pack to store your insulin vials or pens when traveling. Where to find more information  American Diabetes Association: www.diabetes.[employer] of Diabetes and Digestive and Kidney Diseases: [employer] Summary  Do not store insulin in extreme heat or cold, such as in the freezer, direct sunlight, or your vehicle.  Check the expiration date before using insulin and do not use insulin past the expiration date.  Check insulin before using to make sure it looks normal. Mix cloudy insulin before using. Do not use insulin if it is discolored, has particles, or clumps. This information is not intended to replace advice given to you by your health care provider. Make sure you discuss any questions you have with your health care provider. Document Released: 04/15/2009 Document Revised: 07/26/2016 Document Reviewed: 07/26/2016 Elsevier Interactive Patient Education  2019 [place].   Hyperglycemic Hyperosmolar State Hyperglycemic hyperosmolar state is a serious condition in which you experience an extreme increase in your blood sugar (glucose) level. This makes your body become extremely dehydrated, which can be life-threatening. This condition is a result of uncontrolled or undiagnosed diabetes. It occurs most often in people who have type 2 diabetes (type 2 diabetes mellitus). Certain hormones (insulin and glucagon) control the level of glucose that is in the blood. Insulin lowers blood glucose, and glucagon increases blood glucose. Hyperglycemia can result from having too little insulin in the bloodstream, or from the body not responding normally to insulin. Normally, the body gets rid of excess glucose through urine. If you do not drink enough fluids, or if you drink fluids that contain sugar, your body cannot get rid of excess glucose. This can result in hyperglycemic hyperosmolar state. What are the  causes? This condition may be caused by:  Infection.  Medicines that cause you to become  dehydrated or cause you to lose fluid.  Certain illnesses.  Not taking your diabetes medicine.  New onset or diagnosis of diabetes.  Cardiovascular disease (CVD). What increases the risk? The following factors make you more likely to develop this condition:  Older age.  Poor management of diabetes.  Inability to eat or drink normally.  Heart failure.  Infection.  Surgery.  Illness. What are the signs or symptoms? Symptoms of this condition include:  Extreme or increased thirst. This symptom may gradually disappear.  Needing to urinate more often than usual.  Dry mouth.  Warm, dry skin that does not sweat even in high temperatures.  High fever.  Sleepiness or confusion.  Vision problems or vision loss.  Seeing, hearing, tasting, smelling, or feeling things that are not real (hallucinations).  Weakness.  Weight loss.  Vomiting. How is this diagnosed? Hyperglycemic hyperosmolar state is diagnosed based on your medical history, your symptoms, and a blood test to measure your blood glucose level. How is this treated? This condition is treated in the hospital. The goals of treatment are:  To correct dehydration by replacing fluids that you have lost. Fluids will be given through an IV tube.  To improve blood sugar levels using insulin or other medicines as needed.  To treat the cause of hyperglycemia, such as an infection, illness, or newly diagnosed diabetes. Follow these instructions at home: General instructions  Take over-the-counter and prescription medicines only as told by your health care provider.  Do not use any products that contain nicotine or tobacco, such as cigarettes and e-cigarettes. If you need help quitting, ask your health care provider.  Limit alcohol intake to no more than 1 drink a day for nonpregnant women and 2 drinks a day for men. One  drink equals 12 oz of beer, 5 oz of wine, or 1 oz of hard liquor.  Stay hydrated, especially when you exercise, when you get sick, or when you spend time in hot temperatures.  Learn to manage stress. If you need help with this, ask your health care provider.  Keep all follow-up visits as told by your health care provider. This is important. Eating and drinking   Maintain a healthy weight.  Exercise regularly, as directed by your health care provider.  Eat healthy foods, such as: ? Lean proteins. ? Complex carbohydrates. ? Fresh fruits and vegetables. ? Low-fat dairy products. ? Healthy fats.  Drink enough fluid to keep your urine clear or pale yellow. If You Have Diabetes:   Make sure you know the early signs and symptoms of hyperglycemia.  Follow your diabetes management plan, as told by your health care provider. Make sure you: ? Take your insulin and medicines as directed. ? Follow your exercise plan. ? Follow your meal plan. Eat on time, and do not skip meals. ? Check your blood glucose as often as directed. Make sure to check your blood glucose before and after exercise. If you exercise longer or in a different way than usual, check your blood glucose more often. ? Follow your sick day plan whenever you cannot eat or drink normally. Make this plan in advance with your health care provider.  Share your diabetes management plan with people in your workplace, school, and household.  Check your urine for ketones when you are ill and as often as told by your health care provider.  Carry a medical alert card or wear medical alert jewelry. Contact a health care provider if:  You cannot eat or  drink without throwing up.  You develop a fever. Get help right away if:  You develop symptoms of hyperglycemic hyperosmolar state. These symptoms may represent a serious problem that is an emergency. Do not wait to see if the symptoms will go away. Get medical help right away. Call  your local emergency services (911 in the [place] Do not drive yourself to the hospital. Summary  Hyperglycemic hyperosmolar state is a serious condition in which you experience an extreme increase in your blood sugar (glucose) level. This makes your body become extremely dehydrated, which can be life-threatening.  This condition is a result of uncontrolled or undiagnosed diabetes. It occurs most often in people who have type 2 diabetes (type 2 diabetes mellitus).  This condition is treated in the hospital. Treatment may include fluids given through an IV tube and other medicines.  Make sure you know the early signs and symptoms of hyperglycemia.  Follow your diabetes management plan, as told by your health care provider. This information is not intended to replace advice given to you by your health care provider. Make sure you discuss any questions you have with your health care provider. Document Released: 04/20/2004 Document Revised: 06/11/2016 Document Reviewed: 06/11/2016 Elsevier Interactive Patient Education  2019 [employer]   Preventing Diabetes Mellitus Complications You can take action to prevent or slow down problems that are caused by diabetes (diabetes mellitus). Following your diabetes plan and taking care of yourself can reduce your risk of serious or life-threatening complications. What actions can I take to prevent diabetes complications? Manage your diabetes   Follow instructions from your health care providers about managing your diabetes. Your diabetes may be managed by a team of health care providers who can teach you how to care for yourself and can answer questions that you have.  Educate yourself about your condition so you can make healthy choices about eating and physical activity.  Check your blood sugar (glucose) levels as often as directed. Your health care provider will help you decide how often to check your blood glucose level depending on your treatment  goals and how well you are meeting them.  Ask your health care provider if you should take low-dose aspirin daily and what dose is recommended for you. Taking low-dose aspirin daily is recommended to help prevent cardiovascular disease. Do not use nicotine or tobacco Do not use any products that contain nicotine or tobacco, such as cigarettes and e-cigarettes. If you need help quitting, ask your health care provider. Nicotine raises your risk for diabetes problems. If you quit using nicotine:  You will lower your risk for heart attack, stroke, nerve disease, and kidney disease.  Your cholesterol and blood pressure may improve.  Your blood circulation will improve. Keep your blood pressure under control Your personal target blood pressure is determined based on:  Your age.  Your medicines.  How long you have had diabetes.  Any other medical conditions you have. To control your blood pressure:  Follow instructions from your health care provider about meal planning, exercise, and medicines.  Make sure your health care provider checks your blood pressure at every medical visit.  Monitor your blood pressure at home as told by your health care provider.  Keep your cholesterol under control To control your cholesterol:  Follow instructions from your health care provider about meal planning, exercise, and medicines.  Have your cholesterol checked at least once a year.  You may be prescribed medicine to lower cholesterol (statin). If you are  not taking a statin, ask your health care provider if you should be. Controlling your cholesterol may:  Help prevent heart disease and stroke. These are the most common health problems for people with diabetes.  Improve your blood flow. Schedule and keep yearly physical exams and eye exams Your health care provider will tell you how often you need medical visits depending on your diabetes management plan. Keep all follow-up visits as directed.  This is important so possible problems can be identified early and complications can be avoided or treated.  Every visit with your health care provider should include measuring your: ? Weight. ? Blood pressure. ? Blood glucose control.  Your A1c (hemoglobin A1c) level should be checked: ? At least 2 times a year, if you are meeting your treatment goals. ? 4 times a year, if you are not meeting treatment goals or if your treatment goals have changed.  Your blood lipids (lipid profile) should be checked yearly. You should also be checked yearly for protein in your urine (urine microalbumin).  If you have type 1 diabetes, get an eye exam 3-5 years after you are diagnosed, and then once a year after your first exam.  If you have type 2 diabetes, get an eye exam as soon as you are diagnosed, and then once a year after your first exam. Keep your vaccines current It is recommended that you receive:  A flu (influenza) vaccine every year.  A pneumonia (pneumococcal) vaccine and a hepatitis B vaccine. If you are age 62 or older, you may get the pneumonia vaccine as a series of two separate shots. Ask your health care provider which other vaccines may be recommended. Take care of your feet Diabetes may cause you to have poor blood circulation to your legs and feet. Because of this, taking care of your feet is very important. Diabetes can cause:  The skin on the feet to get thinner, break more easily, and heal more slowly.  Nerve damage in your legs and feet, which results in decreased feeling. You may not notice minor injuries that could lead to serious problems. To avoid foot problems:  Check your skin and feet every day for cuts, bruises, redness, blisters, or sores.  Schedule a foot exam with your health care provider once every year. This exam includes: ? Inspecting of the structure and skin of your feet. ? Checking the pulses and sensation in your feet.  Make sure that your health care  provider performs a visual foot exam at every medical visit.  Take care of your teeth People with poorly controlled diabetes are more likely to have gum (periodontal) disease. Diabetes can make periodontal diseases harder to control. If not treated, periodontal diseases can lead to tooth loss. To prevent this:  Brush your teeth twice a day.  Floss at least once a day.  Visit your dentist 2 times a year. Drink responsibly Limit alcohol intake to no more than 1 drink a day for nonpregnant women and 2 drinks a day for men. One drink equals 12 oz of beer, 5 oz of wine, or 1 oz of hard liquor.  It is important to eat food when you drink alcohol to avoid low blood glucose (hypoglycemia). Avoid alcohol if you:  Have a history of alcohol abuse or dependence.  Are pregnant.  Have liver disease, pancreatitis, advanced neuropathy, or severe hypertriglyceridemia. Lessen stress Living with diabetes can be stressful. When you are experiencing stress, your blood glucose may be affected in two  ways:  Stress hormones may cause your blood glucose to rise.  You may be distracted from taking good care of yourself. Be aware of your stress level and make changes to help you manage challenging situations. To lower your stress levels:  Consider joining a support group.  Do planned relaxation or meditation.  Do a hobby that you enjoy.  Maintain healthy relationships.  Exercise regularly.  Work with your health care provider or a mental health professional. Summary  You can take action to prevent or slow down problems that are caused by diabetes (diabetes mellitus). Following your diabetes plan and taking care of yourself can reduce your risk of serious or life-threatening complications.  Follow instructions from your health care providers about managing your diabetes. Your diabetes may be managed by a team of health care providers who can teach you how to care for yourself and can answer questions  that you have.  Your health care provider will tell you how often you need medical visits depending on your diabetes management plan. Keep all follow-up visits as directed. This is important so possible problems can be identified early and complications can be avoided or treated. This information is not intended to replace advice given to you by your health care provider. Make sure you discuss any questions you have with your health care provider. Document Released: 03/06/2011 Document Revised: 02/05/2017 Document Reviewed: 03/17/2016 Elsevier Interactive Patient Education  2019 Elsevier Inc.   IMPORTANT INFORMATION: PAY CLOSE ATTENTION   PHYSICIAN DISCHARGE INSTRUCTIONS  Follow with Primary care provider  Glenda Chroman, MD  and other consultants as instructed your Hospitalist Physician  [place] IF SYMPTOMS COME BACK, WORSEN OR NEW PROBLEM DEVELOPS.   Please note: You were cared for by a hospitalist during your hospital stay. Every effort will be made to forward records to your primary care provider.  You can request that your primary care provider send for your hospital records if they have not received them.  Once you are discharged, your primary care physician will handle any further medical issues. Please note that NO REFILLS for any discharge medications will be authorized once you are discharged, as it is imperative that you return to your primary care physician (or establish a relationship with a primary care physician if you do not have one) for your post hospital discharge needs so that they can reassess your need for medications and monitor your lab values.  Please get a complete blood count and chemistry panel checked by your Primary MD at your next visit, and again as instructed by your Primary MD.  Get Medicines reviewed and adjusted: Please take all your medications with you for your next visit with your Primary MD  Laboratory/radiological  data: Please request your Primary MD to go over all hospital tests and procedure/radiological results at the follow up, please ask your primary care provider to get all Hospital records sent to his/her office.  In some cases, they will be blood work, cultures and biopsy results pending at the time of your discharge. Please request that your primary care provider follow up on these results.  If you are diabetic, please bring your blood sugar readings with you to your follow up appointment with primary care.    Please call and make your follow up appointments as soon as possible.    Also Note the following: If you experience worsening of your admission symptoms, develop shortness of breath, life threatening emergency, suicidal or  homicidal thoughts you must seek medical attention immediately by calling 911 or calling your MD immediately  if symptoms less severe.  You must read complete instructions/literature along with all the possible adverse reactions/side effects for all the Medicines you take and that have been prescribed to you. Take any new Medicines after you have completely understood and accpet all the possible adverse reactions/side effects.   Do not drive when taking Pain medications or sleeping medications (Benzodiazepines)  Do not take more than prescribed Pain, Sleep and Anxiety Medications. It is not advisable to combine anxiety,sleep and pain medications without talking with your primary care practitioner  Special Instructions: If you have smoked or chewed Tobacco  in the last 2 yrs please stop smoking, stop any regular Alcohol  and or any Recreational drug use.  Wear Seat belts while driving.

## 2018-09-03 NOTE — Telephone Encounter (Signed)
Received message from Maudie Mercury at [facility]  Patient missed PAT due to being in ED. He was discharged from hospital today. Are we going to proceed with his colonoscopy on 09/09/2018? He was admitted for uncontrolled diabetes. He will need a UDS prior to his procedure, but we can always have him come early the day of procedure for this if we cant get him back in here for any reason.

## 2018-09-03 NOTE — Discharge Summary (Signed)
Physician Discharge Summary  [name] KGM:[id number] DOB: February 12, 1956 DOA: 09/02/2018  PCP: Glenda Chroman, MD Orthopedics: Dr. Remo Lipps Case   Admit date: 09/02/2018 Discharge date: 09/03/2018  Admitted From: Home  Disposition: Home   Recommendations for Outpatient Follow-up:  1. Follow up with PCP in 1 weeks 2. Follow up with Dr. Remo Lipps Case as soon as possible in next 1-2 weeks  Discharge Condition: STABLE   CODE STATUS: FULL    Brief Hospitalization Summary: Please see all hospital notes, images, labs for full details of the hospitalization. HPI: Juan Juarez is a 63 y.o. male with reported history of insulin requiring diabetes mellitus, osteoarthritis, substance abuse, thrombocytopenia, gout, hepatic cirrhosis, hepatitis C and hypertension was sent to the ED by his rheumatologist for treatment of hyperglycemia.  He had recently seen his rheumatologist and had labs done.  He was being seen by his rheumatologist for chronic left elbow wound.  When the labs came back they noticed that his blood sugar was greater than 600 and sent him to the emergency department. The patient reports that he has been taking his blood glucose medications as prescribed but seems to be a poor historian.  On arrival his blood sugar was greater than 500 but he was not in DKA.  He was very dehydrated.  He was started on IV fluid hydration and his blood sugar started to improve down to 400.  He is going to be admitted for management of his poorly controlled diabetes mellitus.  The patient was admitted with hyperglycemia and treated with IV fluids and started on insulin.  He admitted that he has not been taking his insulin regularly.  He also had a hemoglobin A1c greater than 11.  His blood sugars have improved since starting this therapy.  He was seen by the diabetes coordinator and was given intensive education in the hospital.  He was advised to take his medications regularly and follow-up with his primary care physician.   His metformin has been changed to metformin ER 750 mg twice daily with meals.  Levemir 40 units daily.  He was encouraged to monitor his blood glucose 3-4 times daily and as needed when not feeling well.  He verbalized understanding.  He was advised to avoid alcohol and recreational drugs.  He was seen by the wound care nurse and foam dressing applied to his left elbow.  He was strongly advised to follow-up with his orthopedics Dr. at [facility] Dr. Case for further care of his left elbow.  He recently had an MRI and had been referred to rheumatology and he was advised to follow-up with rheumatology as well.  He is being discharged in stable condition.  Discharge Diagnoses:  Principal Problem:   Type 2 diabetes mellitus with hyperglycemia, with long-term current use of insulin (HCC) Active Problems:   Essential hypertension, benign   Smoker   Hyperglycemia   Polysubstance abuse (HCC)   Protein-calorie malnutrition, severe (HCC)   Thrombocytopenia (HCC)   Dehydration   Hepatic cirrhosis (HCC)   Tobacco abuse   Hyperosmolar (nonketotic) coma (HCC)   Discharge Instructions: Discharge Instructions    Call MD for:  difficulty breathing, headache or visual disturbances   Complete by:  As directed    Call MD for:  difficulty breathing, headache or visual disturbances   Complete by:  As directed    Call MD for:  extreme fatigue   Complete by:  As directed    Call MD for:  extreme fatigue   Complete by:  As directed    Call MD for:  persistant dizziness or light-headedness   Complete by:  As directed    Call MD for:  persistant dizziness or light-headedness   Complete by:  As directed    Call MD for:  persistant nausea and vomiting   Complete by:  As directed    Call MD for:  persistant nausea and vomiting   Complete by:  As directed    Call MD for:  severe uncontrolled pain   Complete by:  As directed    Call MD for:  severe uncontrolled pain   Complete by:  As directed    Increase activity  slowly   Complete by:  As directed    Increase activity slowly   Complete by:  As directed      Allergies as of 09/03/2018   No Known Allergies     Medication List    STOP taking these medications   metFORMIN 500 MG tablet Commonly known as:  GLUCOPHAGE Replaced by:  metFORMIN 750 MG 24 hr tablet     TAKE these medications   amitriptyline 25 MG tablet Commonly known as:  ELAVIL Take 25 mg by mouth at bedtime.   blood glucose meter kit and supplies Dispense based on patient and insurance preference. Use up to four times daily as directed. (FOR ICD-10 E10.9, E11.9).   COLCRYS 0.6 MG tablet Generic drug:  colchicine Take 0.6 mg by mouth daily.   glipiZIDE 5 MG tablet Commonly known as:  GLUCOTROL Take 5 mg by mouth daily.   LEVEMIR 100 UNIT/ML injection Generic drug:  insulin detemir Inject 0.4 mLs (40 Units total) into the skin daily. What changed:  how much to take   metFORMIN 750 MG 24 hr tablet Commonly known as:  GLUCOPHAGE-XR Take 1 tablet (750 mg total) by mouth daily with breakfast for 30 days. Replaces:  metFORMIN 500 MG tablet   OXcarbazepine 150 MG tablet Commonly known as:  TRILEPTAL Take 150 mg by mouth 2 (two) times daily.   oxyCODONE 5 MG immediate release tablet Commonly known as:  Oxy IR/ROXICODONE Take 5 mg by mouth 2 (two) times daily as needed for moderate pain.      Follow-up Information    Vyas, Dhruv B, MD. Schedule an appointment as soon as possible for a visit in 2 day(s).   Specialty:  Internal Medicine Contact information: [place] [address] [phone]        Case, Reche Dixon, MD. Schedule an appointment as soon as possible for a visit in 1 week(s).   Specialty:  Orthopedic Surgery Why:  Hospital Follow Up  Contact information: [place] [address] [phone]          No Known Allergies Allergies as of 09/03/2018   No Known Allergies     Medication List    STOP taking these  medications   metFORMIN 500 MG tablet Commonly known as:  GLUCOPHAGE Replaced by:  metFORMIN 750 MG 24 hr tablet     TAKE these medications   amitriptyline 25 MG tablet Commonly known as:  ELAVIL Take 25 mg by mouth at bedtime.   blood glucose meter kit and supplies Dispense based on patient and insurance preference. Use up to four times daily as directed. (FOR ICD-10 E10.9, E11.9).   COLCRYS 0.6 MG tablet Generic drug:  colchicine Take 0.6 mg by mouth daily.   glipiZIDE 5 MG tablet Commonly known as:  GLUCOTROL Take 5 mg by mouth daily.   LEVEMIR 100 UNIT/ML injection Generic drug:  insulin detemir Inject 0.4 mLs (40 Units total) into the skin daily. What changed:  how much to take   metFORMIN 750 MG 24 hr tablet Commonly known as:  GLUCOPHAGE-XR Take 1 tablet (750 mg total) by mouth daily with breakfast for 30 days. Replaces:  metFORMIN 500 MG tablet   OXcarbazepine 150 MG tablet Commonly known as:  TRILEPTAL Take 150 mg by mouth 2 (two) times daily.   oxyCODONE 5 MG immediate release tablet Commonly known as:  Oxy IR/ROXICODONE Take 5 mg by mouth 2 (two) times daily as needed for moderate pain.      Procedures/Studies:  No results found.   Subjective: Pt without complaints.   Discharge Exam: Vitals:   09/03/18 0720 09/03/18 0827  BP:  (!) 151/88  Pulse:  77  Resp:  15  Temp: 98 F (36.7 C) 98.5 F (36.9 C)  SpO2:  99%   Vitals:   09/03/18 0715 09/03/18 0720 09/03/18 0720 09/03/18 0827  BP: 138/82 138/82  (!) 151/88  Pulse:  92  77  Resp:  18  15  Temp:  98 F (36.7 C) 98 F (36.7 C) 98.5 F (36.9 C)  TempSrc:  Oral Oral Oral  SpO2:  97%  99%  Weight:      Height:    '5\' 7"'$  (1.702 m)    General: Pt is alert, awake, not in acute distress Cardiovascular: RRR, S1/S2 +, no rubs, no gallops Respiratory: CTA bilaterally, no wheezing, no rhonchi Abdominal: Soft, NT, ND, bowel sounds + Extremities: left elbow with foam pad applied.    The  results of significant diagnostics from this hospitalization (including imaging, microbiology, ancillary and laboratory) are listed below for reference.     Microbiology: No results found for this or any previous visit (from the past 240 hour(s)).   Labs: BNP (last 3 results) No results for input(s): BNP in the last 8760 hours. Basic Metabolic Panel: Recent Labs  Lab 09/02/18 1324 09/03/18 0457  NA 127* 131*  K 4.4 3.9  CL 95* 102  CO2 23 23  GLUCOSE 449* 252*  BUN 16 14  CREATININE 0.73 0.63  CALCIUM 8.5* 8.3*  MG  --  1.4*  PHOS  --  3.5   Liver Function Tests: Recent Labs  Lab 09/03/18 0457  AST 70*  ALT 44  ALKPHOS 97  BILITOT 0.8  PROT 6.9  ALBUMIN 2.2*   No results for input(s): LIPASE, AMYLASE in the last 168 hours. No results for input(s): AMMONIA in the last 168 hours. CBC: Recent Labs  Lab 09/02/18 1324 09/03/18 0457  WBC 6.6 5.9  NEUTROABS  --  2.9  HGB 13.9 13.0  HCT 39.4 38.4*  MCV 92.3 91.9  PLT 130* 132*   Cardiac Enzymes: No results for input(s): CKTOTAL, CKMB, CKMBINDEX, TROPONINI in the last 168 hours. BNP: Invalid input(s): POCBNP CBG: Recent Labs  Lab 09/02/18 1942 09/02/18 2214 09/03/18 0457 09/03/18 0804 09/03/18 1120  GLUCAP 220* 179* 245* 262* 388*   D-Dimer No results for input(s): DDIMER in the last 72 hours. Hgb A1c Recent Labs    09/02/18 1324  HGBA1C 11.5*   Lipid Profile No results for input(s): CHOL, HDL, LDLCALC, TRIG, CHOLHDL, LDLDIRECT in the last 72 hours. Thyroid function studies No results for input(s): TSH, T4TOTAL, T3FREE, THYROIDAB in the last 72 hours.  Invalid input(s): FREET3 Anemia work up No results for input(s):  VITAMINB12, FOLATE, FERRITIN, TIBC, IRON, RETICCTPCT in the last 72 hours. Urinalysis    Component Value Date/Time   COLORURINE YELLOW 09/02/2018 1224   APPEARANCEUR CLEAR 09/02/2018 1224   LABSPEC 1.032 (H) 09/02/2018 1224   PHURINE 6.0 09/02/2018 1224   GLUCOSEU >=500 (A)  09/02/2018 1224   HGBUR SMALL (A) 09/02/2018 1224   BILIRUBINUR NEGATIVE 09/02/2018 1224   KETONESUR NEGATIVE 09/02/2018 1224   PROTEINUR 30 (A) 09/02/2018 1224   UROBILINOGEN 0.2 07/01/2014 2244   NITRITE NEGATIVE 09/02/2018 1224   LEUKOCYTESUR SMALL (A) 09/02/2018 1224   Sepsis Labs Invalid input(s): PROCALCITONIN,  WBC,  LACTICIDVEN Microbiology No results found for this or any previous visit (from the past 240 hour(s)).  Time coordinating discharge:  SIGNED:  Irwin Brakeman, MD  [facility] 09/03/2018, 11:24 AM How to contact the [facility] Attending or Consulting provider [place] or covering provider during after hours [place], for this patient?  1. Check the care team in [facility] and look for a) attending/consulting [facility] provider listed and b) the [facility] team listed 2. Log into [email or web address] and use [place]'s universal password to access. If you do not have the password, please contact the hospital operator. 3. Locate the [facility] provider you are looking for under [facility] and page to a number that you can be directly reached. 4. If you still have difficulty reaching the provider, please page the [facility] (Director on Call) for the Hospitalists listed on amion for assistance.

## 2018-09-03 NOTE — Progress Notes (Signed)
Discharge instructions given and gone over with patient. Insulin practice kit and education book was also given to patient. Patient demonstrated proper way to administer insulin and check sugar. Patient had no further questions

## 2018-09-03 NOTE — Consult Note (Signed)
[place] Nurse wound consult note Consult performed using Elink technology and assistance from bedside nurse  Reason for Consult: elbow wound, noted in chart review to be followed by outpatient MD. Not clear on length of time present. Appears chronic in nature Wound type: full thickness wound left elbow, unclear etiology Pressure Injury POA: NA Measurement: see nursing flow sheet, aprox. 0.5cm x 0.3cm x 0.2cm  Wound bed: yellow, soft material  Drainage (amount, consistency, odor) nurse reports "watery"  Periwound:edema, appears to have limited ROM Dressing procedure/placement/frequency:  Silver hydrofiber for recalcitrant wound with yellow base. Cover with foam, change every other day. May need to consider MRI if joint involvement a concern. Follow up with MD as outpatient for continued assessment.   Discussed POC with patient and bedside nurse.  Re consult if needed, will not follow at this time. Thanks  Nairi Oswald [employer], RN,CWOCN, CNS, [place] [phone])

## 2018-09-03 NOTE — Progress Notes (Signed)
Inpatient Diabetes Program Recommendations  AACE/ADA: New Consensus Statement on Inpatient Glycemic Control (2015)  Target Ranges:  Prepandial:   less than 140 mg/dL      Peak postprandial:   less than 180 mg/dL (1-2 hours)      Critically ill patients:  140 - 180 mg/dL   Lab Results  Component Value Date   GLUCAP 262 (H) 09/03/2018   HGBA1C 11.5 (H) 09/02/2018    Review of Glycemic Control Results for ENNIO, HOUP (MRN [id number]) as of 09/03/2018 09:33  Ref. Range 09/02/2018 16:40 09/02/2018 19:42 09/02/2018 22:14 09/03/2018 04:57 09/03/2018 08:04  Glucose-Capillary Latest Ref Range: 70 - 99 mg/dL 251 (H) 220 (H) 179 (H) 245 (H) 262 (H)   Diabetes history: DM2 Outpatient Diabetes medications: Levemir 60 units daily + Glucotrol 5 mg + Metformin 500 mg bid Current orders for Inpatient glycemic control: Lantus 15 units + Novolog 6 units tid meal coverage + Novolog moderate correction + hs 0-5  Spoke with patient by phone (DM coordinator not on campus). Patient shared that he sometimes "skips" his insulin injection because he doesn't like to stick himself. Reviewed with him that the insulin pen is also available as an option if he decides he prefers the pen. Patient wants to continue with the insulin pen and wants to refocus on giving himself his injection each morning when he gets up. Patient said he was used to walking a lot but when he became disabled he is lazy and not walking or taking his insulin regularly. Reviewed with patient risks with elevated CBGs.  Spoke with pt about  A1C 11.5 (average blood glucose 283 over the past 2-3 months)results with them and explained what an A1C is, basic pathophysiology of DM Type 2, basic home care, basic diabetes diet nutrition principles, importance of checking CBGs and maintaining good CBG control to prevent long-term and short-term complications. Reviewed signs and symptoms of hyperglycemia and hypoglycemia and how to treat hypoglycemia at home. Also  reviewed blood sugar goals at home.  RNs to provide ongoing basic DM education at bedside with this patient. Have ordered educational booklet, insulin starter kit, and DM videos.  DM coordinator spoke with patient 11/07/17 concerning elevated A1c of 12.4 and patient was discharged on Levemir insulin @ this time.  Patient is no longer seeing Dr. Dorris Fetch as endocrinologist and Dr. Woody Seller manages his diabetes. Requested patient to check CBGs 3-4 times daily and take CBG log or meter with him to MD appts to review and be able to adjust insulin needs. Patient states he does not drink sweetened drinks or anything other than water or drinks with splenda.  Thank you, Nani Gasser. Brigitt Mcclish, RN, MSN, CDE  Diabetes Coordinator Inpatient Glycemic Control Team Team Pager [phone] (8am-5pm) 09/03/2018 9:45 AM

## 2018-09-03 NOTE — Telephone Encounter (Signed)
caled pt, NA, VM not set up. Called endo and LMOVM to cancel.

## 2018-09-03 NOTE — ED Notes (Signed)
Pt ambulated to bathroom.  Steady gait. No pain

## 2018-09-04 LAB — MRSA PCR SCREENING: MRSA by PCR: POSITIVE — AB

## 2018-09-04 LAB — URINE CULTURE: Culture: <10000 — AB

## 2018-09-04 NOTE — Telephone Encounter (Signed)
Spoke with pt and is aware. He has pending appt here 11/12/2018.

## 2018-09-09 ENCOUNTER — Encounter (HOSPITAL_COMMUNITY): Admission: RE | Payer: Self-pay | Source: Home / Self Care

## 2018-09-09 ENCOUNTER — Ambulatory Visit (HOSPITAL_COMMUNITY): Admission: RE | Admit: 2018-09-09 | Payer: Medicare Other | Source: Home / Self Care | Admitting: Gastroenterology

## 2018-09-09 SURGERY — COLONOSCOPY WITH PROPOFOL
Anesthesia: Monitor Anesthesia Care

## 2018-09-27 DIAGNOSIS — L02414 Cutaneous abscess of left upper limb: Secondary | ICD-10-CM | POA: Diagnosis not present

## 2018-09-27 DIAGNOSIS — F17200 Nicotine dependence, unspecified, uncomplicated: Secondary | ICD-10-CM | POA: Diagnosis not present

## 2018-09-27 DIAGNOSIS — E119 Type 2 diabetes mellitus without complications: Secondary | ICD-10-CM | POA: Diagnosis not present

## 2018-09-27 DIAGNOSIS — M25422 Effusion, left elbow: Secondary | ICD-10-CM | POA: Diagnosis not present

## 2018-09-27 DIAGNOSIS — M19022 Primary osteoarthritis, left elbow: Secondary | ICD-10-CM | POA: Diagnosis not present

## 2018-09-27 DIAGNOSIS — R5381 Other malaise: Secondary | ICD-10-CM | POA: Diagnosis not present

## 2018-09-27 DIAGNOSIS — R58 Hemorrhage, not elsewhere classified: Secondary | ICD-10-CM | POA: Diagnosis not present

## 2018-09-27 DIAGNOSIS — Z794 Long term (current) use of insulin: Secondary | ICD-10-CM | POA: Diagnosis not present

## 2018-09-27 DIAGNOSIS — Z79899 Other long term (current) drug therapy: Secondary | ICD-10-CM | POA: Diagnosis not present

## 2018-10-23 ENCOUNTER — Encounter (HOSPITAL_BASED_OUTPATIENT_CLINIC_OR_DEPARTMENT_OTHER): Payer: Self-pay | Admitting: *Deleted

## 2018-10-23 ENCOUNTER — Other Ambulatory Visit: Payer: Self-pay | Admitting: Orthopedic Surgery

## 2018-10-23 ENCOUNTER — Other Ambulatory Visit: Payer: Self-pay

## 2018-10-23 DIAGNOSIS — G8929 Other chronic pain: Secondary | ICD-10-CM | POA: Diagnosis not present

## 2018-10-23 DIAGNOSIS — M25522 Pain in left elbow: Secondary | ICD-10-CM | POA: Diagnosis not present

## 2018-10-23 NOTE — Progress Notes (Addendum)
Juan Juarez denies chest pain or shortness of breath. Patient has Type II diabetes, he doesn't check CBG because he doesn't want to. I instructed patient to check CBG after awaking and every 2 hours until arrival  to the hospital.  I Instructed patient if CBG is less than 70 to drink 1/2 cup of a clear juice. Recheck CBG in 15 minutes then call pre- op desk at [phone] for further instructions.   Patient denies that he or his family have experienced any of the following: Cough Fever >100.4 Runny Nose Sore Throat Difficulty breathing/ shortness of breath Travel in past 14 days  I instructed patient to take 1/2 of Lantus dose tonight- 20 units, and do not take pills for diabetes in am.

## 2018-10-23 NOTE — Progress Notes (Signed)
Reviewed pt's pmh, recent hospitalizations from 08/2018 and 7/23-02/04/2019 with Dr Sabra Heck. Pt's elbow surgery will need to be done at the main OR, he is not a candidate for out patient surgery.

## 2018-10-24 ENCOUNTER — Encounter (HOSPITAL_COMMUNITY): Payer: Self-pay | Admitting: Certified Registered Nurse Anesthetist

## 2018-10-24 ENCOUNTER — Ambulatory Visit (HOSPITAL_COMMUNITY)
Admission: RE | Admit: 2018-10-24 | Discharge: 2018-10-24 | Disposition: A | Discharge status: Home / Self Care | Payer: Medicare Other | Attending: Orthopedic Surgery | Admitting: Orthopedic Surgery

## 2018-10-24 ENCOUNTER — Encounter (HOSPITAL_COMMUNITY)
Admission: RE | Disposition: A | Discharge status: Home / Self Care | Payer: Self-pay | Source: Home / Self Care | Attending: Orthopedic Surgery

## 2018-10-24 ENCOUNTER — Ambulatory Visit (HOSPITAL_COMMUNITY): Payer: Medicare Other | Admitting: Certified Registered Nurse Anesthetist

## 2018-10-24 DIAGNOSIS — M1990 Unspecified osteoarthritis, unspecified site: Secondary | ICD-10-CM | POA: Insufficient documentation

## 2018-10-24 DIAGNOSIS — E1165 Type 2 diabetes mellitus with hyperglycemia: Secondary | ICD-10-CM | POA: Diagnosis not present

## 2018-10-24 DIAGNOSIS — Z8619 Personal history of other infectious and parasitic diseases: Secondary | ICD-10-CM | POA: Insufficient documentation

## 2018-10-24 DIAGNOSIS — I10 Essential (primary) hypertension: Secondary | ICD-10-CM | POA: Insufficient documentation

## 2018-10-24 DIAGNOSIS — M13822 Other specified arthritis, left elbow: Secondary | ICD-10-CM | POA: Diagnosis not present

## 2018-10-24 DIAGNOSIS — M12822 Other specific arthropathies, not elsewhere classified, left elbow: Secondary | ICD-10-CM | POA: Diagnosis not present

## 2018-10-24 DIAGNOSIS — Z9114 Patient's other noncompliance with medication regimen: Secondary | ICD-10-CM | POA: Insufficient documentation

## 2018-10-24 DIAGNOSIS — F17210 Nicotine dependence, cigarettes, uncomplicated: Secondary | ICD-10-CM | POA: Insufficient documentation

## 2018-10-24 DIAGNOSIS — Z6825 Body mass index (BMI) 25.0-25.9, adult: Secondary | ICD-10-CM | POA: Insufficient documentation

## 2018-10-24 DIAGNOSIS — G4700 Insomnia, unspecified: Secondary | ICD-10-CM | POA: Diagnosis not present

## 2018-10-24 DIAGNOSIS — Z7984 Long term (current) use of oral hypoglycemic drugs: Secondary | ICD-10-CM | POA: Diagnosis not present

## 2018-10-24 DIAGNOSIS — E119 Type 2 diabetes mellitus without complications: Secondary | ICD-10-CM | POA: Diagnosis not present

## 2018-10-24 DIAGNOSIS — D696 Thrombocytopenia, unspecified: Secondary | ICD-10-CM | POA: Diagnosis not present

## 2018-10-24 DIAGNOSIS — Z794 Long term (current) use of insulin: Secondary | ICD-10-CM | POA: Diagnosis not present

## 2018-10-24 DIAGNOSIS — F419 Anxiety disorder, unspecified: Secondary | ICD-10-CM | POA: Diagnosis not present

## 2018-10-24 DIAGNOSIS — F329 Major depressive disorder, single episode, unspecified: Secondary | ICD-10-CM | POA: Insufficient documentation

## 2018-10-24 DIAGNOSIS — E43 Unspecified severe protein-calorie malnutrition: Secondary | ICD-10-CM | POA: Insufficient documentation

## 2018-10-24 DIAGNOSIS — Z79899 Other long term (current) drug therapy: Secondary | ICD-10-CM | POA: Insufficient documentation

## 2018-10-24 DIAGNOSIS — D2112 Benign neoplasm of connective and other soft tissue of left upper limb, including shoulder: Secondary | ICD-10-CM | POA: Insufficient documentation

## 2018-10-24 DIAGNOSIS — M109 Gout, unspecified: Secondary | ICD-10-CM | POA: Insufficient documentation

## 2018-10-24 DIAGNOSIS — M7989 Other specified soft tissue disorders: Secondary | ICD-10-CM | POA: Diagnosis not present

## 2018-10-24 DIAGNOSIS — Z87828 Personal history of other (healed) physical injury and trauma: Secondary | ICD-10-CM | POA: Diagnosis not present

## 2018-10-24 HISTORY — DX: Unspecified injury of head, initial encounter: S09.90XA

## 2018-10-24 HISTORY — DX: Unspecified convulsions: R56.9

## 2018-10-24 HISTORY — PX: INCISION AND DRAINAGE: SHX5863

## 2018-10-24 LAB — CBC
HCT: 40.4 % (ref 39.0–52.0)
Hemoglobin: 14.1 g/dL (ref 13.0–17.0)
MCH: 32 pg (ref 26.0–34.0)
MCHC: 34.9 g/dL (ref 30.0–36.0)
MCV: 91.8 fL (ref 80.0–100.0)
Platelets: 182 10*3/uL (ref 150–400)
RBC: 4.4 MIL/uL (ref 4.22–5.81)
RDW: 15 % (ref 11.5–15.5)
WBC: 7.1 10*3/uL (ref 4.0–10.5)
nRBC: 0 % (ref 0.0–0.2)

## 2018-10-24 LAB — BASIC METABOLIC PANEL
Anion gap: 12 (ref 5–15)
BUN: 10 mg/dL (ref 8–23)
CO2: 21 mmol/L — ABNORMAL LOW (ref 22–32)
Calcium: 8.9 mg/dL (ref 8.9–10.3)
Chloride: 103 mmol/L (ref 98–111)
Creatinine, Ser: 0.83 mg/dL (ref 0.61–1.24)
GFR calc Af Amer: >60 mL/min (ref >60)
GFR calc non Af Amer: >60 mL/min (ref >60)
Glucose, Bld: 197 mg/dL — ABNORMAL HIGH (ref 70–99)
Potassium: 3.6 mmol/L (ref 3.5–5.1)
Sodium: 136 mmol/L (ref 135–145)

## 2018-10-24 LAB — GLUCOSE, CAPILLARY
Glucose-Capillary: 184 mg/dL — ABNORMAL HIGH (ref 70–99)
Glucose-Capillary: 200 mg/dL — ABNORMAL HIGH (ref 70–99)

## 2018-10-24 LAB — SURGICAL PCR SCREEN
MRSA, PCR: POSITIVE — AB
Staphylococcus aureus: POSITIVE — AB

## 2018-10-24 SURGERY — INCISION AND DRAINAGE
Anesthesia: General | Site: Elbow | Laterality: Left

## 2018-10-24 MED ORDER — CEFAZOLIN SODIUM-DEXTROSE 2-3 GM-%(50ML) IV SOLR
INTRAVENOUS | Status: DC | PRN
  Administered 2018-10-24: 2 g via INTRAVENOUS

## 2018-10-24 MED ORDER — CHLORHEXIDINE GLUCONATE 4 % EX LIQD: 60.0000 mL | Freq: Once | CUTANEOUS | Status: DC

## 2018-10-24 MED ORDER — OXYCODONE HCL 5 MG PO TABS
ORAL_TABLET | ORAL | Status: AC
  Administered 2018-10-24: 5 mg via ORAL
  Filled 2018-10-24: qty 1

## 2018-10-24 MED ORDER — PROPOFOL 10 MG/ML IV BOLUS
INTRAVENOUS | Status: DC | PRN
  Administered 2018-10-24: 160 mg via INTRAVENOUS

## 2018-10-24 MED ORDER — SODIUM CHLORIDE 0.9 % IR SOLN
Status: DC | PRN
  Administered 2018-10-24: 1000 mL

## 2018-10-24 MED ORDER — DEXAMETHASONE SODIUM PHOSPHATE 10 MG/ML IJ SOLN
INTRAMUSCULAR | Status: AC
  Filled 2018-10-24: qty 1

## 2018-10-24 MED ORDER — DEXAMETHASONE SODIUM PHOSPHATE 10 MG/ML IJ SOLN
INTRAMUSCULAR | Status: DC | PRN
  Administered 2018-10-24: 10 mg via INTRAVENOUS

## 2018-10-24 MED ORDER — CEFAZOLIN SODIUM 1 G IJ SOLR
INTRAMUSCULAR | Status: AC
  Filled 2018-10-24: qty 30

## 2018-10-24 MED ORDER — FENTANYL CITRATE (PF) 100 MCG/2ML IJ SOLN
INTRAMUSCULAR | Status: AC
  Administered 2018-10-24: 50 ug via INTRAVENOUS
  Filled 2018-10-24: qty 2

## 2018-10-24 MED ORDER — BUPIVACAINE HCL (PF) 0.25 % IJ SOLN
INTRAMUSCULAR | Status: DC | PRN
  Administered 2018-10-24: 10 mL

## 2018-10-24 MED ORDER — ONDANSETRON HCL 4 MG/2ML IJ SOLN: 4.0000 mg | Freq: Once | INTRAMUSCULAR | Status: DC | PRN

## 2018-10-24 MED ORDER — ONDANSETRON HCL 4 MG/2ML IJ SOLN
INTRAMUSCULAR | Status: AC
  Filled 2018-10-24: qty 2

## 2018-10-24 MED ORDER — LABETALOL HCL 5 MG/ML IV SOLN
INTRAVENOUS | Status: DC | PRN
  Administered 2018-10-24: 2.5 mg via INTRAVENOUS

## 2018-10-24 MED ORDER — MIDAZOLAM HCL 2 MG/2ML IJ SOLN
INTRAMUSCULAR | Status: AC
  Filled 2018-10-24: qty 2

## 2018-10-24 MED ORDER — PHENYLEPHRINE 40 MCG/ML (10ML) SYRINGE FOR IV PUSH (FOR BLOOD PRESSURE SUPPORT)
PREFILLED_SYRINGE | INTRAVENOUS | Status: DC | PRN
  Administered 2018-10-24: 80 ug via INTRAVENOUS

## 2018-10-24 MED ORDER — OXYCODONE HCL 5 MG/5ML PO SOLN: 5.0000 mg | Freq: Once | ORAL | Status: AC | PRN

## 2018-10-24 MED ORDER — SUCCINYLCHOLINE CHLORIDE 20 MG/ML IJ SOLN
INTRAMUSCULAR | Status: DC | PRN
  Administered 2018-10-24: 140 mg via INTRAVENOUS

## 2018-10-24 MED ORDER — LIDOCAINE 2% (20 MG/ML) 5 ML SYRINGE
INTRAMUSCULAR | Status: DC | PRN
  Administered 2018-10-24: 100 mg via INTRAVENOUS

## 2018-10-24 MED ORDER — PHENYLEPHRINE 40 MCG/ML (10ML) SYRINGE FOR IV PUSH (FOR BLOOD PRESSURE SUPPORT)
PREFILLED_SYRINGE | INTRAVENOUS | Status: AC
  Filled 2018-10-24: qty 10

## 2018-10-24 MED ORDER — MIDAZOLAM HCL 2 MG/2ML IJ SOLN
INTRAMUSCULAR | Status: DC | PRN
  Administered 2018-10-24: 2 mg via INTRAVENOUS

## 2018-10-24 MED ORDER — ONDANSETRON HCL 4 MG/2ML IJ SOLN
INTRAMUSCULAR | Status: DC | PRN
  Administered 2018-10-24: 4 mg via INTRAVENOUS

## 2018-10-24 MED ORDER — FENTANYL CITRATE (PF) 250 MCG/5ML IJ SOLN
INTRAMUSCULAR | Status: DC | PRN
  Administered 2018-10-24: 50 ug via INTRAVENOUS
  Administered 2018-10-24: 100 ug via INTRAVENOUS
  Administered 2018-10-24 (×2): 50 ug via INTRAVENOUS

## 2018-10-24 MED ORDER — FENTANYL CITRATE (PF) 250 MCG/5ML IJ SOLN
INTRAMUSCULAR | Status: AC
  Filled 2018-10-24: qty 5

## 2018-10-24 MED ORDER — OXYCODONE-ACETAMINOPHEN 5-325 MG PO TABS: 1.0000 | ORAL_TABLET | ORAL | 0 refills | Status: DC | PRN

## 2018-10-24 MED ORDER — BUPIVACAINE HCL (PF) 0.25 % IJ SOLN
INTRAMUSCULAR | Status: AC
  Filled 2018-10-24: qty 30

## 2018-10-24 MED ORDER — GLYCOPYRROLATE PF 0.2 MG/ML IJ SOSY
PREFILLED_SYRINGE | INTRAMUSCULAR | Status: AC
  Filled 2018-10-24: qty 1

## 2018-10-24 MED ORDER — OXYCODONE HCL 5 MG PO TABS
5.0000 mg | ORAL_TABLET | Freq: Once | ORAL | Status: AC | PRN
  Administered 2018-10-24: 15:00:00 5 mg via ORAL

## 2018-10-24 MED ORDER — FENTANYL CITRATE (PF) 100 MCG/2ML IJ SOLN
25.0000 ug | INTRAMUSCULAR | Status: DC | PRN
  Administered 2018-10-24 (×2): 50 ug via INTRAVENOUS

## 2018-10-24 MED ORDER — LACTATED RINGERS IV SOLN
INTRAVENOUS | Status: DC
  Administered 2018-10-24: 11:00:00 via INTRAVENOUS

## 2018-10-24 MED ORDER — LABETALOL HCL 5 MG/ML IV SOLN
INTRAVENOUS | Status: AC
  Filled 2018-10-24: qty 4

## 2018-10-24 SURGICAL SUPPLY — 66 items
BANDAGE ACE 3X5.8 VEL STRL LF (GAUZE/BANDAGES/DRESSINGS) ×2 IMPLANT
BANDAGE ACE 4X5 VEL STRL LF (GAUZE/BANDAGES/DRESSINGS) ×2 IMPLANT
BANDAGE ELASTIC 4 VELCRO ST LF (GAUZE/BANDAGES/DRESSINGS) ×2 IMPLANT
BNDG CMPR 9X4 STRL LF SNTH (GAUZE/BANDAGES/DRESSINGS)
BNDG CONFORM 2 STRL LF (GAUZE/BANDAGES/DRESSINGS) IMPLANT
BNDG ESMARK 4X9 LF (GAUZE/BANDAGES/DRESSINGS) IMPLANT
BNDG GAUZE ELAST 4 BULKY (GAUZE/BANDAGES/DRESSINGS) ×4 IMPLANT
CLSR STERI-STRIP ANTIMIC 1/2X4 (GAUZE/BANDAGES/DRESSINGS) ×1 IMPLANT
CORDS BIPOLAR (ELECTRODE) IMPLANT
COVER SURGICAL LIGHT HANDLE (MISCELLANEOUS) ×4 IMPLANT
COVER WAND RF STERILE (DRAPES) ×2 IMPLANT
CUFF TOURNIQUET SINGLE 18IN (TOURNIQUET CUFF) ×2 IMPLANT
DRAIN PENROSE 1/4X12 LTX STRL (WOUND CARE) IMPLANT
DRAPE OEC MINIVIEW 54X84 (DRAPES) IMPLANT
DRAPE SURG 17X23 STRL (DRAPES) ×2 IMPLANT
DRSG PAD ABDOMINAL 8X10 ST (GAUZE/BANDAGES/DRESSINGS) ×4 IMPLANT
DURAPREP 26ML APPLICATOR (WOUND CARE) IMPLANT
ELECT REM PT RETURN 9FT ADLT (ELECTROSURGICAL)
ELECTRODE REM PT RTRN 9FT ADLT (ELECTROSURGICAL) IMPLANT
GAUZE PACKING IODOFORM 1/4X5 (PACKING) IMPLANT
GAUZE SPONGE 4X4 12PLY STRL (GAUZE/BANDAGES/DRESSINGS) ×4 IMPLANT
GAUZE SPONGE 4X4 12PLY STRL LF (GAUZE/BANDAGES/DRESSINGS) ×1 IMPLANT
GAUZE XEROFORM 1X8 LF (GAUZE/BANDAGES/DRESSINGS) ×2 IMPLANT
GLOVE SURG SYN 8.0 (GLOVE) ×2 IMPLANT
GLOVE SURG SYN 8.0 PF PI (GLOVE) ×1 IMPLANT
GOWN STRL REUS W/ TWL LRG LVL3 (GOWN DISPOSABLE) ×1 IMPLANT
GOWN STRL REUS W/ TWL XL LVL3 (GOWN DISPOSABLE) ×1 IMPLANT
GOWN STRL REUS W/TWL LRG LVL3 (GOWN DISPOSABLE) ×2
GOWN STRL REUS W/TWL XL LVL3 (GOWN DISPOSABLE) ×2
HANDPIECE INTERPULSE COAX TIP (DISPOSABLE)
KIT BASIN OR (CUSTOM PROCEDURE TRAY) ×2 IMPLANT
KIT TURNOVER KIT B (KITS) ×2 IMPLANT
MANIFOLD NEPTUNE II (INSTRUMENTS) ×2 IMPLANT
NDL HYPO 25GX1X1/2 BEV (NEEDLE) IMPLANT
NDL HYPO 25X1 1.5 SAFETY (NEEDLE) ×1 IMPLANT
NEEDLE HYPO 25GX1X1/2 BEV (NEEDLE) IMPLANT
NEEDLE HYPO 25X1 1.5 SAFETY (NEEDLE) ×2 IMPLANT
NS IRRIG 1000ML POUR BTL (IV SOLUTION) ×2 IMPLANT
PACK ORTHO EXTREMITY (CUSTOM PROCEDURE TRAY) ×2 IMPLANT
PAD ARMBOARD 7.5X6 YLW CONV (MISCELLANEOUS) ×4 IMPLANT
PAD CAST 4YDX4 CTTN HI CHSV (CAST SUPPLIES) ×2 IMPLANT
PADDING CAST COTTON 4X4 STRL (CAST SUPPLIES) ×4
PADDING CAST SYNTHETIC 4 (CAST SUPPLIES) ×2
PADDING CAST SYNTHETIC 4X4 STR (CAST SUPPLIES) IMPLANT
SET HNDPC FAN SPRY TIP SCT (DISPOSABLE) IMPLANT
SLING ARM FOAM STRAP LRG (SOFTGOODS) ×1 IMPLANT
SPLINT PLASTER CAST XFAST 5X30 (CAST SUPPLIES) IMPLANT
SPLINT PLASTER XFAST SET 5X30 (CAST SUPPLIES) ×1
SPONGE LAP 18X18 RF (DISPOSABLE) ×2 IMPLANT
SUCTION FRAZIER HANDLE 10FR (MISCELLANEOUS) ×1
SUCTION TUBE FRAZIER 10FR DISP (MISCELLANEOUS) ×1 IMPLANT
SUT PROLENE 3 0 PS 2 (SUTURE) ×1 IMPLANT
SUT VIC AB 0 CT1 27 (SUTURE) ×2
SUT VIC AB 0 CT1 27XBRD ANBCTR (SUTURE) IMPLANT
SUT VIC AB 3-0 SH 27 (SUTURE) ×2
SUT VIC AB 3-0 SH 27X BRD (SUTURE) IMPLANT
SUT VICRYL RAPIDE 4/0 PS 2 (SUTURE) IMPLANT
SWAB COLLECTION DEVICE MRSA (MISCELLANEOUS) ×1 IMPLANT
SWAB CULTURE ESWAB REG 1ML (MISCELLANEOUS) ×1 IMPLANT
SYR 20CC LL (SYRINGE) IMPLANT
SYR CONTROL 10ML LL (SYRINGE) ×2 IMPLANT
TOWEL OR 17X24 6PK STRL BLUE (TOWEL DISPOSABLE) ×2 IMPLANT
TOWEL OR 17X26 10 PK STRL BLUE (TOWEL DISPOSABLE) ×2 IMPLANT
TUBE CONNECTING 12X1/4 (SUCTIONS) ×2 IMPLANT
UNDERPAD 30X30 (UNDERPADS AND DIAPERS) ×2 IMPLANT
YANKAUER SUCT BULB TIP NO VENT (SUCTIONS) ×2 IMPLANT

## 2018-10-24 NOTE — Op Note (Signed)
NAME: Juan Juarez, Juan Juarez MEDICAL RECORD [id number] ACCOUNT [id number] DATE OF BIRTH:[birth year] FACILITY: [facility] LOCATION: [facility] PHYSICIAN:Klay Sobotka A. 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  PROCEDURE:  Incision and drainage with radiocapitellar arthrotomy, thorough joint debridement and synovectomy and biopsy of elbow joint.  SURGEON:  Charlotte Crumb, MD  ASSISTANT:  Dasnoit, PA.  ANESTHESIA:  General.  COMPLICATIONS:  None.  DRAINS:  No drains.  DESCRIPTION OF PROCEDURE:  The patient was taken to the operating suite after induction of adequate general anesthetic.  The left upper extremity was prepped and draped in the usual sterile fashion.  The arm was elevated and the tourniquet was inflated  to 250 mmHg.  At this point, incision was made over the lateral aspect of the left elbow at the area of the radiocapitellar joint and distal humerus where a draining sinus was encountered.  We carefully dissected down to the distal humerus and the  lateral epicondyle.  We cultured cloudy fluid coming from this area for aerobic, anaerobic and Gram stain.  We sent multiple specimens from the joint including specimens for gout and for pathology.  We made a Kocher type approach to the radiocapitellar  joint and dissected into the joint.  We debrided thoroughly significantly inflamed and thickened synovium and visualized significant destruction of the radial head and distal humerus.  There was also bone sent for culture as well.  After thorough  debridement of the joint from the lateral to the medial side, we irrigated with a liter of normal saline into the irrigant was clear.  We then closed the Kocher interval with 0 Vicryl and the skin with 3-0 Vicryl and 3-0 Prolene.  We then made an  incision anteromedially and  dissected down to the area between the median nerve and the biceps tendon.  We identified all vital structures and opened the fascia of the forearm.  No significant evidence of infection or soft tissue mass was encountered.   This wound was thoroughly irrigated and loosely closed in the same fashion.  We then dressed with 4 x 4's, fluffs and a posterior elbow splint.  The patient tolerated this procedure well and went to recovery in stable fashion.  TN/NUANCE  D:10/24/2018 T:10/24/2018 JOB:[id number]

## 2018-10-24 NOTE — Transfer of Care (Signed)
Immediate Anesthesia Transfer of Care Note  Patient: Juan Juarez  Procedure(s) Performed: INCISION AND DRAINAGE LEFT ELBOW WITH MASS EXCISION (Left Elbow)  Patient Location: PACU  Anesthesia Type:General  Level of Consciousness: drowsy and patient cooperative  Airway & Oxygen Therapy: Patient Spontanous Breathing  Post-op Assessment: Report given to RN, Post -op Vital signs reviewed and stable and Patient moving all extremities X 4  Post vital signs: Reviewed and stable  Last Vitals:  Vitals Value Taken Time  BP    Temp    Pulse 82 10/24/2018  2:29 PM  Resp 20 10/24/2018  2:29 PM  SpO2 99 % 10/24/2018  2:29 PM  Vitals shown include unvalidated device data.  Last Pain:  Vitals:   10/24/18 1021  TempSrc: Oral      Patients Stated Pain Goal: 3 (79/39/03 0092)  Complications: No apparent anesthesia complications

## 2018-10-24 NOTE — H&P (View-Only) (Signed)
Juan Juarez is an 63 y.o. male.   Chief Complaint: Left proximal forearm pain, swelling, and drainage chronic HPI: Patient is a very pleasant 63-year-old male with chronic drainage and swelling in the proximal forearm and elbow on his left side x5 months with MRI documented deep fluid collection  Past Medical History:  Diagnosis Date  . Anxiety   . Chronic elbow pain, left   . Depression   . Diabetes mellitus, type II (HCC)    type II  . Gout   . Head injury 12/2017  . Hepatitis C without hepatic coma   . Hypertension    denies  . Inflammatory arthropathy    left elbow  . Insomnia   . Non compliance w medication regimen   . Osteoarthritis   . Polysubstance abuse (HCC)   . Protein-calorie malnutrition, severe (HCC) 09/13/2016  . Seizure (HCC) 12/2017   after head Injury  . Thrombocytopenia (HCC)     Past Surgical History:  Procedure Laterality Date  . BURR HOLE Right 01/22/2018   Procedure: RIGHT BURR HOLES;  Surgeon: Jenkins, Jeffrey, MD;  Location: [facility] OR;  Service: Neurosurgery;  Laterality: Right;  . MULTIPLE TOOTH EXTRACTIONS    . None      Family History  Problem Relation Age of Onset  . Thyroid disease Sister   . Colon cancer Neg Hx   . Colon polyps Neg Hx    Social History:  reports that he has been smoking cigarettes. He has a 25.00 pack-year smoking history. He has never used smokeless tobacco. He reports previous alcohol use. He reports previous drug use. Drugs: Marijuana and Cocaine.  Allergies: No Known Allergies  Medications Prior to Admission  Medication Sig Dispense Refill  . glipiZIDE (GLUCOTROL) 5 MG tablet Take 5 mg by mouth daily.     . LEVEMIR 100 UNIT/ML injection Inject 0.4 mLs (40 Units total) into the skin daily. 10 mL 0  . metFORMIN (GLUCOPHAGE) 500 MG tablet Take 500 mg by mouth 2 (two) times daily.    . blood glucose meter kit and supplies Dispense based on patient and insurance preference. Use up to four times daily as directed. (FOR  ICD-10 E10.9, E11.9). 1 each 0    Results for orders placed or performed during the hospital encounter of 10/24/18 (from the past 48 hour(s))  Glucose, capillary     Status: Abnormal   Collection Time: 10/24/18 10:23 AM  Result Value Ref Range   Glucose-Capillary 184 (H) 70 - 99 mg/dL  Basic metabolic panel     Status: Abnormal   Collection Time: 10/24/18 10:25 AM  Result Value Ref Range   Sodium 136 135 - 145 mmol/L   Potassium 3.6 3.5 - 5.1 mmol/L   Chloride 103 98 - 111 mmol/L   CO2 21 (L) 22 - 32 mmol/L   Glucose, Bld 197 (H) 70 - 99 mg/dL   BUN 10 8 - 23 mg/dL   Creatinine, Ser 0.83 0.61 - 1.24 mg/dL   Calcium 8.9 8.9 - 10.3 mg/dL   GFR calc non Af Amer >60 >60 mL/min   GFR calc Af Amer >60 >60 mL/min   Anion gap 12 5 - 15    Comment: Performed at [facility], [address] , [place] [address]  CBC     Status: None   Collection Time: 10/24/18 10:25 AM  Result Value Ref Range   WBC 7.1 4.0 - 10.5 K/uL   RBC 4.40 4.22 - 5.81 MIL/uL     Hemoglobin 14.1 13.0 - 17.0 g/dL   HCT 40.4 39.0 - 52.0 %   MCV 91.8 80.0 - 100.0 fL   MCH 32.0 26.0 - 34.0 pg   MCHC 34.9 30.0 - 36.0 g/dL   RDW 15.0 11.5 - 15.5 %   Platelets 182 150 - 400 K/uL   nRBC 0.0 0.0 - 0.2 %    Comment: Performed at [facility], [address] [place], [place] [address]   No results found.  Review of Systems  All other systems reviewed and are negative.   Blood pressure 136/81, pulse 75, temperature 98.3 F (36.8 C), temperature source Oral, resp. rate 18, height 5' 7" (1.702 m), weight 74.8 kg, SpO2 100 %. Physical Exam  Constitutional: He is oriented to person, place, and time. He appears well-developed and well-nourished.  HENT:  Head: Normocephalic and atraumatic.  Neck: Normal range of motion.  Cardiovascular: Normal rate.  Respiratory: Effort normal.  Musculoskeletal:     Left forearm: He exhibits swelling and edema.     Comments: Left forearm proximal swelling and  drainage chronic  Neurological: He is alert and oriented to person, place, and time.  Skin: Skin is warm.  Psychiatric: He has a normal mood and affect. His behavior is normal. Judgment and thought content normal.     Assessment/Plan 63-year-old male with chronic drainage and fluid collection proximal forearm left and elbow.  Have discussed the role of incision and drainage with biopsy, culturing, and specimens for gout and other inflammatory arthropathies.  Patient understands risks and benefits and wishes to proceed   A , MD 10/24/2018, 11:30 AM   

## 2018-10-24 NOTE — Anesthesia Preprocedure Evaluation (Addendum)
Anesthesia Evaluation  Patient identified by MRN, date of birth, ID band Patient awake    Reviewed: Allergy & Precautions, NPO status , Patient's Chart, lab work & pertinent test results  Airway Mallampati: II  TM Distance: >3 FB Neck ROM: Full    Dental  (+) Edentulous Upper, Dental Advisory Given   Pulmonary Current Smoker,    breath sounds clear to auscultation       Cardiovascular hypertension,  Rhythm:Regular Rate:Normal     Neuro/Psych    GI/Hepatic   Endo/Other  diabetes  Renal/GU      Musculoskeletal   Abdominal   Peds  Hematology   Anesthesia Other Findings   Reproductive/Obstetrics                             Anesthesia Physical Anesthesia Plan  ASA: III  Anesthesia Plan: General   Post-op Pain Management:    Induction: Intravenous  PONV Risk Score and Plan: Ondansetron  Airway Management Planned: Oral ETT  Additional Equipment:   Intra-op Plan:   Post-operative Plan: Extubation in OR  Informed Consent: I have reviewed the patients History and Physical, chart, labs and discussed the procedure including the risks, benefits and alternatives for the proposed anesthesia with the patient or authorized representative who has indicated his/her understanding and acceptance.     Dental advisory given  Plan Discussed with: CRNA and Anesthesiologist  Anesthesia Plan Comments:         Anesthesia Quick Evaluation

## 2018-10-24 NOTE — H&P (Signed)
Juan Juarez is an 63 y.o. male.   Chief Complaint: Left proximal forearm pain, swelling, and drainage chronic HPI: Patient is a very pleasant 63-year-old male with chronic drainage and swelling in the proximal forearm and elbow on his left side x5 months with MRI documented deep fluid collection  Past Medical History:  Diagnosis Date  . Anxiety   . Chronic elbow pain, left   . Depression   . Diabetes mellitus, type II (HCC)    type II  . Gout   . Head injury 12/2017  . Hepatitis C without hepatic coma   . Hypertension    denies  . Inflammatory arthropathy    left elbow  . Insomnia   . Non compliance w medication regimen   . Osteoarthritis   . Polysubstance abuse (HCC)   . Protein-calorie malnutrition, severe (HCC) 09/13/2016  . Seizure (HCC) 12/2017   after head Injury  . Thrombocytopenia (HCC)     Past Surgical History:  Procedure Laterality Date  . BURR HOLE Right 01/22/2018   Procedure: RIGHT BURR HOLES;  Surgeon: Jenkins, Jeffrey, MD;  Location: [facility] OR;  Service: Neurosurgery;  Laterality: Right;  . MULTIPLE TOOTH EXTRACTIONS    . None      Family History  Problem Relation Age of Onset  . Thyroid disease Sister   . Colon cancer Neg Hx   . Colon polyps Neg Hx    Social History:  reports that he has been smoking cigarettes. He has a 25.00 pack-year smoking history. He has never used smokeless tobacco. He reports previous alcohol use. He reports previous drug use. Drugs: Marijuana and Cocaine.  Allergies: No Known Allergies  Medications Prior to Admission  Medication Sig Dispense Refill  . glipiZIDE (GLUCOTROL) 5 MG tablet Take 5 mg by mouth daily.     . LEVEMIR 100 UNIT/ML injection Inject 0.4 mLs (40 Units total) into the skin daily. 10 mL 0  . metFORMIN (GLUCOPHAGE) 500 MG tablet Take 500 mg by mouth 2 (two) times daily.    . blood glucose meter kit and supplies Dispense based on patient and insurance preference. Use up to four times daily as directed. (FOR  ICD-10 E10.9, E11.9). 1 each 0    Results for orders placed or performed during the hospital encounter of 10/24/18 (from the past 48 hour(s))  Glucose, capillary     Status: Abnormal   Collection Time: 10/24/18 10:23 AM  Result Value Ref Range   Glucose-Capillary 184 (H) 70 - 99 mg/dL  Basic metabolic panel     Status: Abnormal   Collection Time: 10/24/18 10:25 AM  Result Value Ref Range   Sodium 136 135 - 145 mmol/L   Potassium 3.6 3.5 - 5.1 mmol/L   Chloride 103 98 - 111 mmol/L   CO2 21 (L) 22 - 32 mmol/L   Glucose, Bld 197 (H) 70 - 99 mg/dL   BUN 10 8 - 23 mg/dL   Creatinine, Ser 0.83 0.61 - 1.24 mg/dL   Calcium 8.9 8.9 - 10.3 mg/dL   GFR calc non Af Amer >60 >60 mL/min   GFR calc Af Amer >60 >60 mL/min   Anion gap 12 5 - 15    Comment: Performed at [facility], [address] [place], [place] [address]  CBC     Status: None   Collection Time: 10/24/18 10:25 AM  Result Value Ref Range   WBC 7.1 4.0 - 10.5 K/uL   RBC 4.40 4.22 - 5.81 MIL/uL     Hemoglobin 14.1 13.0 - 17.0 g/dL   HCT 40.4 39.0 - 52.0 %   MCV 91.8 80.0 - 100.0 fL   MCH 32.0 26.0 - 34.0 pg   MCHC 34.9 30.0 - 36.0 g/dL   RDW 15.0 11.5 - 15.5 %   Platelets 182 150 - 400 K/uL   nRBC 0.0 0.0 - 0.2 %    Comment: Performed at [facility], [address] [place], [place] [address]   No results found.  Review of Systems  All other systems reviewed and are negative.   Blood pressure 136/81, pulse 75, temperature 98.3 F (36.8 C), temperature source Oral, resp. rate 18, height 5' 7" (1.702 m), weight 74.8 kg, SpO2 100 %. Physical Exam  Constitutional: He is oriented to person, place, and time. He appears well-developed and well-nourished.  HENT:  Head: Normocephalic and atraumatic.  Neck: Normal range of motion.  Cardiovascular: Normal rate.  Respiratory: Effort normal.  Musculoskeletal:     Left forearm: He exhibits swelling and edema.     Comments: Left forearm proximal swelling and  drainage chronic  Neurological: He is alert and oriented to person, place, and time.  Skin: Skin is warm.  Psychiatric: He has a normal mood and affect. His behavior is normal. Judgment and thought content normal.     Assessment/Plan 63-year-old male with chronic drainage and fluid collection proximal forearm left and elbow.  Have discussed the role of incision and drainage with biopsy, culturing, and specimens for gout and other inflammatory arthropathies.  Patient understands risks and benefits and wishes to proceed  Torres Hardenbrook A Alenna Russell, MD 10/24/2018, 11:30 AM   

## 2018-10-24 NOTE — Anesthesia Postprocedure Evaluation (Signed)
Anesthesia Post Note  Patient: Juan Juarez  Procedure(s) Performed: INCISION AND DRAINAGE LEFT ELBOW WITH MASS EXCISION (Left Elbow)     Patient location during evaluation: PACU Anesthesia Type: General Level of consciousness: awake Pain management: pain level controlled Vital Signs Assessment: post-procedure vital signs reviewed and stable Respiratory status: spontaneous breathing Cardiovascular status: stable Postop Assessment: no apparent nausea or vomiting Anesthetic complications: no    Last Vitals:  Vitals:   10/24/18 1445 10/24/18 1500  BP: (!) 153/92 (!) 162/95  Pulse: 78 79  Resp: 14   Temp:    SpO2: 97% 95%    Last Pain:  Vitals:   10/24/18 1445  TempSrc:   PainSc: 9                  Edelyn Heidel

## 2018-10-24 NOTE — Progress Notes (Signed)
Spoke with dr green re: elevated cbg 200, pt has had no meds for same today / no orders rec'd, to take meds when at home

## 2018-10-24 NOTE — Op Note (Signed)
Please see dictated report [phone]

## 2018-10-24 NOTE — Anesthesia Procedure Notes (Signed)
Procedure Name: Intubation Date/Time: 10/24/2018 1:08 PM Performed by: Julieta Bellini, CRNA Pre-anesthesia Checklist: Patient identified, Emergency Drugs available, Suction available and Patient being monitored Patient Re-evaluated:Patient Re-evaluated prior to induction Oxygen Delivery Method: Circle system utilized Preoxygenation: Pre-oxygenation with 100% oxygen Induction Type: IV induction and Rapid sequence Ventilation: Mask ventilation without difficulty Laryngoscope Size: Mac and 4 Grade View: Grade I Tube type: Oral Tube size: 7.5 mm Number of attempts: 1 Airway Equipment and Method: Stylet and Oral airway Placement Confirmation: ETT inserted through vocal cords under direct vision,  positive ETCO2 and breath sounds checked- equal and bilateral Secured at: 23 cm Tube secured with: Tape Dental Injury: Teeth and Oropharynx as per pre-operative assessment

## 2018-10-25 ENCOUNTER — Encounter (HOSPITAL_COMMUNITY): Payer: Self-pay | Admitting: Orthopedic Surgery

## 2018-10-27 ENCOUNTER — Encounter (HOSPITAL_COMMUNITY): Payer: Self-pay | Admitting: Orthopedic Surgery

## 2018-10-27 NOTE — OR Nursing (Signed)
Addendum created to reflect correct PACU event times

## 2018-10-28 DIAGNOSIS — G8929 Other chronic pain: Secondary | ICD-10-CM | POA: Diagnosis not present

## 2018-10-28 DIAGNOSIS — M25522 Pain in left elbow: Secondary | ICD-10-CM | POA: Diagnosis not present

## 2018-10-29 LAB — AEROBIC/ANAEROBIC CULTURE W GRAM STAIN (SURGICAL/DEEP WOUND)

## 2018-11-05 ENCOUNTER — Inpatient Hospital Stay (HOSPITAL_COMMUNITY)
Admission: AD | Admit: 2018-11-05 | Discharge: 2018-11-27 | DRG: 629 | Disposition: A | Discharge status: Home Health | Payer: Medicare Other | Source: Ambulatory Visit | Attending: Internal Medicine | Admitting: Internal Medicine

## 2018-11-05 ENCOUNTER — Inpatient Hospital Stay (HOSPITAL_COMMUNITY): Payer: Medicare Other

## 2018-11-05 ENCOUNTER — Ambulatory Visit (INDEPENDENT_AMBULATORY_CARE_PROVIDER_SITE_OTHER): Payer: Medicare Other | Admitting: Infectious Diseases

## 2018-11-05 ENCOUNTER — Other Ambulatory Visit: Payer: Self-pay | Admitting: Orthopedic Surgery

## 2018-11-05 ENCOUNTER — Other Ambulatory Visit: Payer: Self-pay

## 2018-11-05 ENCOUNTER — Encounter: Payer: Self-pay | Admitting: Infectious Diseases

## 2018-11-05 ENCOUNTER — Encounter (HOSPITAL_COMMUNITY): Payer: Self-pay

## 2018-11-05 VITALS — BP 153/85 | HR 86 | Temp 98.6°F | Wt 166.0 lb

## 2018-11-05 DIAGNOSIS — K7460 Unspecified cirrhosis of liver: Secondary | ICD-10-CM | POA: Diagnosis present

## 2018-11-05 DIAGNOSIS — M009 Pyogenic arthritis, unspecified: Secondary | ICD-10-CM | POA: Diagnosis not present

## 2018-11-05 DIAGNOSIS — D696 Thrombocytopenia, unspecified: Secondary | ICD-10-CM | POA: Diagnosis not present

## 2018-11-05 DIAGNOSIS — B182 Chronic viral hepatitis C: Secondary | ICD-10-CM | POA: Diagnosis present

## 2018-11-05 DIAGNOSIS — F1490 Cocaine use, unspecified, uncomplicated: Secondary | ICD-10-CM | POA: Diagnosis present

## 2018-11-05 DIAGNOSIS — M00022 Staphylococcal arthritis, left elbow: Secondary | ICD-10-CM | POA: Diagnosis present

## 2018-11-05 DIAGNOSIS — Z794 Long term (current) use of insulin: Secondary | ICD-10-CM | POA: Diagnosis not present

## 2018-11-05 DIAGNOSIS — Z87820 Personal history of traumatic brain injury: Secondary | ICD-10-CM | POA: Diagnosis not present

## 2018-11-05 DIAGNOSIS — M869 Osteomyelitis, unspecified: Secondary | ICD-10-CM | POA: Diagnosis not present

## 2018-11-05 DIAGNOSIS — Z79899 Other long term (current) drug therapy: Secondary | ICD-10-CM | POA: Diagnosis not present

## 2018-11-05 DIAGNOSIS — F17210 Nicotine dependence, cigarettes, uncomplicated: Secondary | ICD-10-CM | POA: Diagnosis not present

## 2018-11-05 DIAGNOSIS — G4700 Insomnia, unspecified: Secondary | ICD-10-CM | POA: Diagnosis present

## 2018-11-05 DIAGNOSIS — F419 Anxiety disorder, unspecified: Secondary | ICD-10-CM | POA: Diagnosis present

## 2018-11-05 DIAGNOSIS — A4902 Methicillin resistant Staphylococcus aureus infection, unspecified site: Secondary | ICD-10-CM | POA: Diagnosis not present

## 2018-11-05 DIAGNOSIS — Z8739 Personal history of other diseases of the musculoskeletal system and connective tissue: Secondary | ICD-10-CM | POA: Diagnosis not present

## 2018-11-05 DIAGNOSIS — Z720 Tobacco use: Secondary | ICD-10-CM

## 2018-11-05 DIAGNOSIS — E785 Hyperlipidemia, unspecified: Secondary | ICD-10-CM | POA: Diagnosis present

## 2018-11-05 DIAGNOSIS — F329 Major depressive disorder, single episode, unspecified: Secondary | ICD-10-CM | POA: Diagnosis not present

## 2018-11-05 DIAGNOSIS — D481 Neoplasm of uncertain behavior of connective and other soft tissue: Secondary | ICD-10-CM | POA: Diagnosis not present

## 2018-11-05 DIAGNOSIS — F1290 Cannabis use, unspecified, uncomplicated: Secondary | ICD-10-CM | POA: Diagnosis present

## 2018-11-05 DIAGNOSIS — I10 Essential (primary) hypertension: Secondary | ICD-10-CM | POA: Diagnosis present

## 2018-11-05 DIAGNOSIS — M109 Gout, unspecified: Secondary | ICD-10-CM | POA: Diagnosis present

## 2018-11-05 DIAGNOSIS — B9562 Methicillin resistant Staphylococcus aureus infection as the cause of diseases classified elsewhere: Secondary | ICD-10-CM | POA: Diagnosis not present

## 2018-11-05 DIAGNOSIS — M86622 Other chronic osteomyelitis, left  humerus: Secondary | ICD-10-CM | POA: Diagnosis not present

## 2018-11-05 DIAGNOSIS — E1165 Type 2 diabetes mellitus with hyperglycemia: Secondary | ICD-10-CM | POA: Diagnosis present

## 2018-11-05 DIAGNOSIS — E0865 Diabetes mellitus due to underlying condition with hyperglycemia: Secondary | ICD-10-CM | POA: Diagnosis not present

## 2018-11-05 DIAGNOSIS — E1169 Type 2 diabetes mellitus with other specified complication: Principal | ICD-10-CM | POA: Diagnosis present

## 2018-11-05 DIAGNOSIS — S065X9A Traumatic subdural hemorrhage with loss of consciousness of unspecified duration, initial encounter: Secondary | ICD-10-CM | POA: Diagnosis not present

## 2018-11-05 DIAGNOSIS — M00822 Arthritis due to other bacteria, left elbow: Secondary | ICD-10-CM

## 2018-11-05 DIAGNOSIS — R011 Cardiac murmur, unspecified: Secondary | ICD-10-CM | POA: Diagnosis not present

## 2018-11-05 DIAGNOSIS — M79632 Pain in left forearm: Secondary | ICD-10-CM | POA: Diagnosis present

## 2018-11-05 DIAGNOSIS — R768 Other specified abnormal immunological findings in serum: Secondary | ICD-10-CM | POA: Diagnosis not present

## 2018-11-05 DIAGNOSIS — F1010 Alcohol abuse, uncomplicated: Secondary | ICD-10-CM | POA: Diagnosis present

## 2018-11-05 DIAGNOSIS — M25422 Effusion, left elbow: Secondary | ICD-10-CM | POA: Diagnosis not present

## 2018-11-05 LAB — BASIC METABOLIC PANEL
Anion gap: 11 (ref 5–15)
BUN: 12 mg/dL (ref 8–23)
CO2: 23 mmol/L (ref 22–32)
Calcium: 8.3 mg/dL — ABNORMAL LOW (ref 8.9–10.3)
Chloride: 96 mmol/L — ABNORMAL LOW (ref 98–111)
Creatinine, Ser: 0.9 mg/dL (ref 0.61–1.24)
GFR calc Af Amer: >60 mL/min (ref >60)
GFR calc non Af Amer: >60 mL/min (ref >60)
Glucose, Bld: 385 mg/dL — ABNORMAL HIGH (ref 70–99)
Potassium: 3.8 mmol/L (ref 3.5–5.1)
Sodium: 130 mmol/L — ABNORMAL LOW (ref 135–145)

## 2018-11-05 LAB — SURGICAL PCR SCREEN
MRSA, PCR: POSITIVE — AB
Staphylococcus aureus: POSITIVE — AB

## 2018-11-05 LAB — C-REACTIVE PROTEIN: CRP: 0.9 mg/dL (ref <1.0)

## 2018-11-05 LAB — CBC
HCT: 34 % — ABNORMAL LOW (ref 39.0–52.0)
Hemoglobin: 12.1 g/dL — ABNORMAL LOW (ref 13.0–17.0)
MCH: 32.3 pg (ref 26.0–34.0)
MCHC: 35.6 g/dL (ref 30.0–36.0)
MCV: 90.7 fL (ref 80.0–100.0)
Platelets: 134 10*3/uL — ABNORMAL LOW (ref 150–400)
RBC: 3.75 MIL/uL — ABNORMAL LOW (ref 4.22–5.81)
RDW: 14.6 % (ref 11.5–15.5)
WBC: 6.8 10*3/uL (ref 4.0–10.5)
nRBC: 0 % (ref 0.0–0.2)

## 2018-11-05 LAB — SEDIMENTATION RATE: Sed Rate: 66 mm/hr — ABNORMAL HIGH (ref 0–16)

## 2018-11-05 LAB — LACTIC ACID, PLASMA: Lactic Acid, Venous: 1.7 mmol/L (ref 0.5–1.9)

## 2018-11-05 MED ORDER — MORPHINE SULFATE (PF) 2 MG/ML IV SOLN
2.0000 mg | INTRAVENOUS | Status: DC | PRN
  Administered 2018-11-06 – 2018-11-23 (×33): 2 mg via INTRAVENOUS
  Filled 2018-11-05 (×33): qty 1

## 2018-11-05 MED ORDER — ONDANSETRON HCL 4 MG PO TABS: 4.0000 mg | ORAL_TABLET | Freq: Four times a day (QID) | ORAL | Status: DC | PRN

## 2018-11-05 MED ORDER — ONDANSETRON HCL 4 MG/2ML IJ SOLN: 4.0000 mg | Freq: Four times a day (QID) | INTRAMUSCULAR | Status: DC | PRN

## 2018-11-05 MED ORDER — VANCOMYCIN HCL IN DEXTROSE 1-5 GM/200ML-% IV SOLN
1000.0000 mg | Freq: Two times a day (BID) | INTRAVENOUS | Status: DC
  Administered 2018-11-06 – 2018-11-10 (×8): 1000 mg via INTRAVENOUS
  Filled 2018-11-05 (×10): qty 200

## 2018-11-05 MED ORDER — SODIUM CHLORIDE 0.9% FLUSH
10.0000 mL | Freq: Two times a day (BID) | INTRAVENOUS | Status: DC
  Administered 2018-11-06 – 2018-11-17 (×10): 10 mL

## 2018-11-05 MED ORDER — SODIUM CHLORIDE 0.9% FLUSH: 10.0000 mL | INTRAVENOUS | Status: DC | PRN

## 2018-11-05 MED ORDER — INSULIN ASPART 100 UNIT/ML ~~LOC~~ SOLN
0.0000 [IU] | Freq: Three times a day (TID) | SUBCUTANEOUS | Status: DC
  Administered 2018-11-05: 18:00:00 3 [IU] via SUBCUTANEOUS

## 2018-11-05 MED ORDER — INSULIN DETEMIR 100 UNIT/ML ~~LOC~~ SOLN
10.0000 [IU] | Freq: Every day | SUBCUTANEOUS | Status: DC
  Administered 2018-11-05 – 2018-11-06 (×2): 10 [IU] via SUBCUTANEOUS
  Filled 2018-11-05 (×3): qty 0.1

## 2018-11-05 MED ORDER — INSULIN ASPART 100 UNIT/ML ~~LOC~~ SOLN
0.0000 [IU] | Freq: Every day | SUBCUTANEOUS | Status: DC
  Administered 2018-11-05: 3 [IU] via SUBCUTANEOUS
  Administered 2018-11-07: 23:00:00 4 [IU] via SUBCUTANEOUS
  Administered 2018-11-08 – 2018-11-09 (×2): 3 [IU] via SUBCUTANEOUS
  Administered 2018-11-13 – 2018-11-26 (×8): 2 [IU] via SUBCUTANEOUS

## 2018-11-05 MED ORDER — OXYCODONE-ACETAMINOPHEN 5-325 MG PO TABS
1.0000 | ORAL_TABLET | ORAL | Status: DC | PRN
  Administered 2018-11-05: 17:00:00 1 via ORAL
  Filled 2018-11-05: qty 1

## 2018-11-05 MED ORDER — NICOTINE 21 MG/24HR TD PT24
21.0000 mg | MEDICATED_PATCH | Freq: Every day | TRANSDERMAL | Status: DC
  Administered 2018-11-05 – 2018-11-27 (×23): 21 mg via TRANSDERMAL
  Filled 2018-11-05 (×23): qty 1

## 2018-11-05 MED ORDER — SODIUM CHLORIDE 0.9% FLUSH
3.0000 mL | Freq: Two times a day (BID) | INTRAVENOUS | Status: DC
  Administered 2018-11-06 – 2018-11-17 (×9): 3 mL via INTRAVENOUS

## 2018-11-05 MED ORDER — INSULIN ASPART 100 UNIT/ML ~~LOC~~ SOLN
0.0000 [IU] | Freq: Three times a day (TID) | SUBCUTANEOUS | Status: DC
  Administered 2018-11-06: 13:00:00 3 [IU] via SUBCUTANEOUS
  Administered 2018-11-06: 09:00:00 5 [IU] via SUBCUTANEOUS
  Administered 2018-11-07: 18:00:00 8 [IU] via SUBCUTANEOUS
  Administered 2018-11-07: 5 [IU] via SUBCUTANEOUS
  Administered 2018-11-07: 11 [IU] via SUBCUTANEOUS
  Administered 2018-11-08: 18:00:00 2 [IU] via SUBCUTANEOUS
  Administered 2018-11-08: 10:00:00 8 [IU] via SUBCUTANEOUS
  Administered 2018-11-08: 12:00:00 3 [IU] via SUBCUTANEOUS
  Administered 2018-11-09: 08:00:00 5 [IU] via SUBCUTANEOUS
  Administered 2018-11-09 (×2): 2 [IU] via SUBCUTANEOUS
  Administered 2018-11-10: 09:00:00 11 [IU] via SUBCUTANEOUS
  Administered 2018-11-10: 13:00:00 2 [IU] via SUBCUTANEOUS
  Administered 2018-11-11: 5 [IU] via SUBCUTANEOUS
  Administered 2018-11-11: 8 [IU] via SUBCUTANEOUS
  Administered 2018-11-11 – 2018-11-12 (×3): 5 [IU] via SUBCUTANEOUS
  Administered 2018-11-13 (×3): 3 [IU] via SUBCUTANEOUS
  Administered 2018-11-14: 5 [IU] via SUBCUTANEOUS
  Administered 2018-11-14: 19:00:00 8 [IU] via SUBCUTANEOUS
  Administered 2018-11-14: 3 [IU] via SUBCUTANEOUS
  Administered 2018-11-15: 2 [IU] via SUBCUTANEOUS
  Administered 2018-11-15 – 2018-11-16 (×4): 3 [IU] via SUBCUTANEOUS
  Administered 2018-11-16: 17:00:00 11 [IU] via SUBCUTANEOUS
  Administered 2018-11-17 – 2018-11-18 (×3): 3 [IU] via SUBCUTANEOUS
  Administered 2018-11-18 (×2): 2 [IU] via SUBCUTANEOUS
  Administered 2018-11-19: 5 [IU] via SUBCUTANEOUS
  Administered 2018-11-20: 2 [IU] via SUBCUTANEOUS
  Administered 2018-11-20: 17:00:00 3 [IU] via SUBCUTANEOUS
  Administered 2018-11-20: 2 [IU] via SUBCUTANEOUS
  Administered 2018-11-21: 09:00:00 5 [IU] via SUBCUTANEOUS
  Administered 2018-11-21: 18:00:00 11 [IU] via SUBCUTANEOUS
  Administered 2018-11-22: 14:00:00 5 [IU] via SUBCUTANEOUS
  Administered 2018-11-22: 09:00:00 3 [IU] via SUBCUTANEOUS
  Administered 2018-11-22: 2 [IU] via SUBCUTANEOUS
  Administered 2018-11-23: 18:00:00 3 [IU] via SUBCUTANEOUS
  Administered 2018-11-23 (×2): 2 [IU] via SUBCUTANEOUS
  Administered 2018-11-24 (×2): 3 [IU] via SUBCUTANEOUS
  Administered 2018-11-24: 14:00:00 2 [IU] via SUBCUTANEOUS
  Administered 2018-11-25: 3 [IU] via SUBCUTANEOUS
  Administered 2018-11-25 – 2018-11-26 (×4): 5 [IU] via SUBCUTANEOUS
  Administered 2018-11-27: 2 [IU] via SUBCUTANEOUS

## 2018-11-05 MED ORDER — HYDRALAZINE HCL 20 MG/ML IJ SOLN: 10.0000 mg | INTRAMUSCULAR | Status: DC | PRN

## 2018-11-05 MED ORDER — VANCOMYCIN HCL 10 G IV SOLR
1500.0000 mg | Freq: Once | INTRAVENOUS | Status: AC
  Administered 2018-11-05: 22:00:00 1500 mg via INTRAVENOUS
  Filled 2018-11-05: qty 1500

## 2018-11-05 MED ORDER — OXYCODONE-ACETAMINOPHEN 5-325 MG PO TABS
1.0000 | ORAL_TABLET | ORAL | Status: DC | PRN
  Administered 2018-11-07 – 2018-11-09 (×8): 1 via ORAL
  Filled 2018-11-05 (×8): qty 1

## 2018-11-05 NOTE — Progress Notes (Signed)
Pt directly admitted to unit. Transported via wheelchair by staff in stable condition. Pt is AOX4 and ambulatory. Skin dry and intact. Pt told to use call bell when help is needed. Bed in lowest position. Will continue to monitor.

## 2018-11-05 NOTE — Progress Notes (Signed)
Pharmacy Antibiotic Note  Juan Juarez is a 63 y.o. male admitted on 11/05/2018 with left elbow infection being admitted from ID clinic.  Pharmacy has been consulted for vancomycin dosing. Recent admit and I&D that grew MRSA; sent out on Bactrim DS. Renal function from previous admit normal. Afebrile, no WBC.  Vancomycin 1000 mg IV Q 12 hrs. Goal AUC 400-550. Expected AUC: 512.3 SCr used: 0.83 (10/2018)  Plan: Vancomycin 1500 mg IV load then 1000 mg IV q12h Levels at steady-state Monitor renal function, clinical progress  Height: 5\' 7"  (170.2 cm) Weight: 161 lb 1.6 oz (73.1 kg) IBW/kg (Calculated) : 66.1  Temp (24hrs), Avg:98.3 F (36.8 C), Min:98 F (36.7 C), Max:98.6 F (37 C)  No results for input(s): WBC, CREATININE, LATICACIDVEN, VANCOTROUGH, VANCOPEAK, VANCORANDOM, GENTTROUGH, GENTPEAK, GENTRANDOM, TOBRATROUGH, TOBRAPEAK, TOBRARND, AMIKACINPEAK, AMIKACINTROU, AMIKACIN in the last 168 hours.  Estimated Creatinine Clearance: 84.1 mL/min (by C-G formula based on SCr of 0.83 mg/dL).    No Known Allergies  Antimicrobials this admission: vancomycin 5/6 >>   Dose adjustments this admission:   Microbiology results: 4/24 left elbow joint 2/2: MRSA    Thank you for allowing pharmacy to be a part of this patient's care.  Renold Genta, PharmD, BCPS Clinical Pharmacist Clinical phone for 11/05/2018 until 10p is [phone] 11/05/2018 4:59 PM  **Pharmacist phone directory can now be found on [email or web address] listed under [place]**

## 2018-11-05 NOTE — Patient Instructions (Signed)
Go to registration/admitting to [facility] for LT elbow septic joint infection. Dr. Fuller Plan is admitting hospitalist. Will be NPO after midnight for surgery tomorrow for repeat I&D with Dr. Burney Gauze.

## 2018-11-05 NOTE — H&P (Addendum)
History and Physical    Juan Juarez GGY:[id number] DOB: [birth year] DOA: 11/05/2018  Referring MD/NP/PA: Rayvon Char, MD PCP: Glenda Chroman, MD  Patient coming from: Infectious disease office  Chief Complaint: Left arm pain  I have personally briefly reviewed patient's old medical records in [place]   HPI: Juan Juarez is a 63 y.o. 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  Patient reports that he has been dealing issues on the left arm for at least 7 months.  Notes that he just recently had a I&D with biopsy performed by Dr. Burney Gauze on 4/24.  Pathology from biopsies taken noted chronic inflammatory tissue.  Cultures were positive for MRSA.  He followed up in his office yesterday where he reports that stitches were taken out, and was started on Bactrim.  He notes that he has been having these intermittent boils that come up and drain pus like fluid.  Denies having any significant fevers, but reports constant pain in the left with swelling.  Reports being unable to do much out of that arm due to pain.  Has been taking oxycodone at home for symptoms with some mild relief.  Followed up with infectious disease Dr. Prince Rome today and after talks with Dr. Burney Gauze recognition was recommended.  Plan for repeat I&D in a.m.  ED Course: Not applicable  Review of Systems  Constitutional: Negative for chills and fever.  HENT: Negative for ear pain and sore throat.   Eyes: Negative for photophobia and pain.  Respiratory: Negative for shortness of breath.   Cardiovascular: Negative for chest pain and claudication.  Gastrointestinal: Negative for abdominal pain, nausea and vomiting.  Genitourinary: Negative for dysuria.  Musculoskeletal: Positive for joint pain and myalgias. Negative for falls.  Neurological: Negative for focal weakness.   Psychiatric/Behavioral: Negative for substance abuse.  All other systems reviewed and are negative.   Past Medical History:  Diagnosis Date  . Anxiety   . Chronic elbow pain, left   . Depression   . Diabetes mellitus, type II ([place])    type II  . Gout   . Head injury 12/2017  . Hepatitis C without hepatic coma   . Hypertension    denies  . Inflammatory arthropathy    left elbow  . Insomnia   . Non compliance w medication regimen   . Osteoarthritis   . Polysubstance abuse ([place])   . Protein-calorie malnutrition, severe ([place]) 09/13/2016  . Seizure ([place]) 12/2017   after head Injury  . Thrombocytopenia ([place])     Past Surgical History:  Procedure Laterality Date  . BURR HOLE Right 01/22/2018   Procedure: RIGHT BURR HOLES;  Surgeon: Newman Pies, MD;  Location: [facility];  Service: Neurosurgery;  Laterality: Right;  . INCISION AND DRAINAGE Left 10/24/2018   Procedure: INCISION AND DRAINAGE LEFT ELBOW WITH MASS EXCISION;  Surgeon: Charlotte Crumb, MD;  Location: [facility];  Service: Orthopedics;  Laterality: Left;  Marland Kitchen MULTIPLE TOOTH EXTRACTIONS    . None       reports that he has been smoking cigarettes. He has a 25.00 pack-year smoking history. He has never used smokeless tobacco. He reports previous alcohol use. He reports previous drug use. Drugs: Marijuana and Cocaine.  No Known Allergies  Family History  Problem Relation Age of Onset  . Thyroid disease Sister   . Colon cancer Neg Hx   .  Colon polyps Neg Hx     Prior to Admission medications   Medication Sig Start Date End Date Taking? Authorizing Provider  blood glucose meter kit and supplies Dispense based on patient and insurance preference. Use up to four times daily as directed. (FOR ICD-10 E10.9, E11.9). 09/03/18   Johnson, Clanford L, MD  glipiZIDE (GLUCOTROL) 5 MG tablet Take 5 mg by mouth daily.  12/10/17   [provider]  LEVEMIR 100 UNIT/ML injection Inject 0.4 mLs (40 Units total) into the skin daily.  09/03/18   Johnson, Clanford L, MD  metFORMIN (GLUCOPHAGE) 500 MG tablet Take 500 mg by mouth 2 (two) times daily. 10/02/18   [provider]  oxyCODONE-acetaminophen (PERCOCET) 5-325 MG tablet Take 1 tablet by mouth every 4 (four) hours as needed for severe pain. 10/24/18 10/24/19  Charlotte Crumb, MD  sulfamethoxazole-trimethoprim (BACTRIM DS) 800-160 MG tablet Take 1 tablet by mouth 2 (two) times a day. 11/04/18 11/14/18  [provider]    Physical Exam:  Constitutional: Male who appears older than stated age in discomfort. Vitals:   11/05/18 1651  BP: (!) 148/85  Pulse: 85  Resp: 19  Temp: 98 F (36.7 C)  TempSrc: Oral  SpO2: 100%  Weight: 73.1 kg  Height: 5' 7" (1.702 m)   Eyes: PERRL, lids and conjunctivae normal ENMT: Mucous membranes are moist. Posterior pharynx clear of any exudate or lesions.  Neck: normal, supple, no masses, no thyromegaly Respiratory: clear to auscultation bilaterally, no wheezing, no crackles. Normal respiratory effort. No accessory muscle use.  Cardiovascular: Regular rate and rhythm, no murmurs / rubs / gallops. No extremity edema. 2+ pedal pulses. No carotid bruits.  Abdomen: no tenderness, no masses palpated. No hepatosplenomegaly. Bowel sounds positive.  Musculoskeletal: no clubbing / cyanosis.  Decreased movement of the left elbow with increased swelling. Skin: Surgical wound noted and bullae present of the mid forearm. Neurologic: CN 2-12 grossly intact. Sensation intact, DTR normal. Strength 5/5 in all 4.  Psychiatric: Normal judgment and insight. Alert and oriented x 3. Normal mood.     Labs on Admission: I have personally reviewed following labs and imaging studies  CBC: No results for input(s): WBC, NEUTROABS, HGB, HCT, MCV, PLT in the last 168 hours. Basic Metabolic Panel: No results for input(s): NA, K, CL, CO2, GLUCOSE, BUN, CREATININE, CALCIUM, MG, PHOS in the last 168 hours. GFR: Estimated Creatinine Clearance: 84.1  mL/min (by C-G formula based on SCr of 0.83 mg/dL). Liver Function Tests: No results for input(s): AST, ALT, ALKPHOS, BILITOT, PROT, ALBUMIN in the last 168 hours. No results for input(s): LIPASE, AMYLASE in the last 168 hours. No results for input(s): AMMONIA in the last 168 hours. Coagulation Profile: No results for input(s): INR, PROTIME in the last 168 hours. Cardiac Enzymes: No results for input(s): CKTOTAL, CKMB, CKMBINDEX, TROPONINI in the last 168 hours. BNP (last 3 results) No results for input(s): PROBNP in the last 8760 hours. HbA1C: No results for input(s): HGBA1C in the last 72 hours. CBG: No results for input(s): GLUCAP in the last 168 hours. Lipid Profile: No results for input(s): CHOL, HDL, LDLCALC, TRIG, CHOLHDL, LDLDIRECT in the last 72 hours. Thyroid Function Tests: No results for input(s): TSH, T4TOTAL, FREET4, T3FREE, THYROIDAB in the last 72 hours. Anemia Panel: No results for input(s): VITAMINB12, FOLATE, FERRITIN, TIBC, IRON, RETICCTPCT in the last 72 hours. Urine analysis:    Component Value Date/Time   COLORURINE YELLOW 09/02/2018 [place] 09/02/2018 1224  LABSPEC 1.032 (H) 09/02/2018 1224   PHURINE 6.0 09/02/2018 1224   GLUCOSEU >=500 (A) 09/02/2018 1224   HGBUR SMALL (A) 09/02/2018 1224   BILIRUBINUR NEGATIVE 09/02/2018 1224   KETONESUR NEGATIVE 09/02/2018 1224   PROTEINUR 30 (A) 09/02/2018 1224   UROBILINOGEN 0.2 07/01/2014 2244   NITRITE NEGATIVE 09/02/2018 1224   LEUKOCYTESUR SMALL (A) 09/02/2018 1224   Sepsis Labs: No results found for this or any previous visit (from the past 240 hour(s)).   Radiological Exams on Admission: No results found.    Assessment/Plan Infected left elbow with MRSA: Patient is recently underwent incision and drainage by Dr. Burney Gauze on 4/24.  Pathology revealed chronic inflammatory tissue with no evidence of gout.  Cultures positive for MRSA.  Patient was started on Bactrim on 5/5, and followed  up in ID clinic today and recommended to be admitted to the hospital.  Dr. Burney Gauze to take for I&D in a.m. -Admit to a MedSurg bed -Check blood cultures x2 -Check CBC, ESR, CRP -Check x-rays of left elbow -Vancomycin per pharmacy -Pain management -Dr. Linus Salmons of infectious disease to follow-up in a.m. -Appreciate Dr. Burney Gauze of orthopedics, plan for surgical I&D in a.m.  Diabetes mellitus type 2: Uncontrolled.  Last hemoglobin A1c noted to be 11.5 on 09/02/2018.  Home medication regimen includes metformin, glipizide, and 40 units of Levemir daily. -Hypoglycemic protocol -Hold medications of metformin and glipizide -Decrease home Levemir to 10 units nightly -CBGs q. before meals and at bedtime with moderate SSI  Tobacco abuse: Patient reports smoking 1/2-1 pack of cigarettes per day on average -Nicotine patch  History hepatitis C and cirrhosis: No signs of ascites noted on physical exam.     Thrombocytopenia: chronic.   DVT prophylaxis: Scds  Code Status: Full  Family Communication: No family present at bedside Disposition Plan: To be determined Consults called: None Admission status: Inpatient  Norval Morton MD [facility] Pager [phone]   If 7PM-7AM, please contact night-coverage [email or web address] Password [facility]  11/05/2018, 4:54 PM
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
## 2018-11-06 ENCOUNTER — Other Ambulatory Visit: Payer: Self-pay

## 2018-11-06 ENCOUNTER — Inpatient Hospital Stay (HOSPITAL_COMMUNITY): Payer: Medicare Other | Admitting: Anesthesiology

## 2018-11-06 ENCOUNTER — Encounter (HOSPITAL_COMMUNITY): Payer: Self-pay | Admitting: *Deleted

## 2018-11-06 ENCOUNTER — Inpatient Hospital Stay (HOSPITAL_COMMUNITY): Admission: RE | Admit: 2018-11-06 | Payer: Medicare Other | Source: Home / Self Care | Admitting: Orthopedic Surgery

## 2018-11-06 ENCOUNTER — Encounter (HOSPITAL_COMMUNITY)
Admission: AD | Disposition: A | Discharge status: Home Health | Payer: Self-pay | Source: Ambulatory Visit | Attending: Internal Medicine

## 2018-11-06 DIAGNOSIS — Z8739 Personal history of other diseases of the musculoskeletal system and connective tissue: Secondary | ICD-10-CM

## 2018-11-06 DIAGNOSIS — I10 Essential (primary) hypertension: Secondary | ICD-10-CM

## 2018-11-06 DIAGNOSIS — M00022 Staphylococcal arthritis, left elbow: Secondary | ICD-10-CM

## 2018-11-06 DIAGNOSIS — M869 Osteomyelitis, unspecified: Secondary | ICD-10-CM

## 2018-11-06 DIAGNOSIS — E1169 Type 2 diabetes mellitus with other specified complication: Principal | ICD-10-CM

## 2018-11-06 DIAGNOSIS — R011 Cardiac murmur, unspecified: Secondary | ICD-10-CM

## 2018-11-06 DIAGNOSIS — D481 Neoplasm of uncertain behavior of connective and other soft tissue: Secondary | ICD-10-CM | POA: Diagnosis not present

## 2018-11-06 DIAGNOSIS — B9562 Methicillin resistant Staphylococcus aureus infection as the cause of diseases classified elsewhere: Secondary | ICD-10-CM

## 2018-11-06 DIAGNOSIS — F17210 Nicotine dependence, cigarettes, uncomplicated: Secondary | ICD-10-CM

## 2018-11-06 DIAGNOSIS — R768 Other specified abnormal immunological findings in serum: Secondary | ICD-10-CM

## 2018-11-06 HISTORY — PX: I & D EXTREMITY: SHX5045

## 2018-11-06 LAB — GLUCOSE, CAPILLARY
Glucose-Capillary: 351 mg/dL — ABNORMAL HIGH (ref 70–99)
Glucose-Capillary: 276 mg/dL — ABNORMAL HIGH (ref 70–99)
Glucose-Capillary: 239 mg/dL — ABNORMAL HIGH (ref 70–99)
Glucose-Capillary: 167 mg/dL — ABNORMAL HIGH (ref 70–99)
Glucose-Capillary: 152 mg/dL — ABNORMAL HIGH (ref 70–99)
Glucose-Capillary: 501 mg/dL (ref 70–99)

## 2018-11-06 LAB — GLUCOSE, RANDOM: Glucose, Bld: 472 mg/dL — ABNORMAL HIGH (ref 70–99)

## 2018-11-06 LAB — CBC
HCT: 33.9 % — ABNORMAL LOW (ref 39.0–52.0)
Hemoglobin: 12.1 g/dL — ABNORMAL LOW (ref 13.0–17.0)
MCH: 32 pg (ref 26.0–34.0)
MCHC: 35.7 g/dL (ref 30.0–36.0)
MCV: 89.7 fL (ref 80.0–100.0)
Platelets: 120 10*3/uL — ABNORMAL LOW (ref 150–400)
RBC: 3.78 MIL/uL — ABNORMAL LOW (ref 4.22–5.81)
RDW: 14.3 % (ref 11.5–15.5)
WBC: 5.4 10*3/uL (ref 4.0–10.5)
nRBC: 0 % (ref 0.0–0.2)

## 2018-11-06 LAB — PROTIME-INR
INR: 1.2 (ref 0.8–1.2)
Prothrombin Time: 14.8 seconds (ref 11.4–15.2)

## 2018-11-06 LAB — BASIC METABOLIC PANEL
Anion gap: 7 (ref 5–15)
BUN: 11 mg/dL (ref 8–23)
CO2: 24 mmol/L (ref 22–32)
Calcium: 8.3 mg/dL — ABNORMAL LOW (ref 8.9–10.3)
Chloride: 101 mmol/L (ref 98–111)
Creatinine, Ser: 0.8 mg/dL (ref 0.61–1.24)
GFR calc Af Amer: >60 mL/min (ref >60)
GFR calc non Af Amer: >60 mL/min (ref >60)
Glucose, Bld: 266 mg/dL — ABNORMAL HIGH (ref 70–99)
Potassium: 4.2 mmol/L (ref 3.5–5.1)
Sodium: 132 mmol/L — ABNORMAL LOW (ref 135–145)

## 2018-11-06 LAB — APTT: aPTT: 31 seconds (ref 24–36)

## 2018-11-06 SURGERY — IRRIGATION AND DEBRIDEMENT EXTREMITY
Anesthesia: General | Laterality: Left

## 2018-11-06 MED ORDER — LIDOCAINE 2% (20 MG/ML) 5 ML SYRINGE
INTRAMUSCULAR | Status: DC | PRN
  Administered 2018-11-06: 50 mg via INTRAVENOUS

## 2018-11-06 MED ORDER — BUPIVACAINE HCL (PF) 0.25 % IJ SOLN
INTRAMUSCULAR | Status: AC
  Filled 2018-11-06: qty 30

## 2018-11-06 MED ORDER — GENTAMICIN SULFATE 40 MG/ML IJ SOLN
INTRAMUSCULAR | Status: AC
  Filled 2018-11-06: qty 2

## 2018-11-06 MED ORDER — BUPIVACAINE HCL (PF) 0.25 % IJ SOLN
INTRAMUSCULAR | Status: DC | PRN
  Administered 2018-11-06: 10 mL

## 2018-11-06 MED ORDER — OXYCODONE HCL 5 MG PO TABS
5.0000 mg | ORAL_TABLET | Freq: Once | ORAL | Status: AC | PRN
  Administered 2018-11-06: 5 mg via ORAL

## 2018-11-06 MED ORDER — CHLORHEXIDINE GLUCONATE 4 % EX LIQD: 60.0000 mL | Freq: Once | CUTANEOUS | Status: DC

## 2018-11-06 MED ORDER — CHLORHEXIDINE GLUCONATE CLOTH 2 % EX PADS
6.0000 | MEDICATED_PAD | Freq: Every day | CUTANEOUS | Status: AC
  Administered 2018-11-07 – 2018-11-10 (×4): 6 via TOPICAL

## 2018-11-06 MED ORDER — DEXAMETHASONE SODIUM PHOSPHATE 10 MG/ML IJ SOLN
INTRAMUSCULAR | Status: DC | PRN
  Administered 2018-11-06: 10 mg via INTRAVENOUS

## 2018-11-06 MED ORDER — OXYCODONE HCL 5 MG/5ML PO SOLN: 5.0000 mg | Freq: Once | ORAL | Status: AC | PRN

## 2018-11-06 MED ORDER — ONDANSETRON HCL 4 MG/2ML IJ SOLN
INTRAMUSCULAR | Status: DC | PRN
  Administered 2018-11-06: 4 mg via INTRAVENOUS

## 2018-11-06 MED ORDER — VANCOMYCIN HCL 1000 MG IV SOLR
INTRAVENOUS | Status: AC
  Filled 2018-11-06: qty 1000

## 2018-11-06 MED ORDER — MUPIROCIN 2 % EX OINT
1.0000 "application " | TOPICAL_OINTMENT | Freq: Two times a day (BID) | CUTANEOUS | Status: AC
  Administered 2018-11-06 – 2018-11-10 (×10): 1 via NASAL
  Filled 2018-11-06 (×5): qty 22

## 2018-11-06 MED ORDER — LACTATED RINGERS IV SOLN
INTRAVENOUS | Status: DC
  Administered 2018-11-06 – 2018-11-15 (×4): via INTRAVENOUS

## 2018-11-06 MED ORDER — LACTATED RINGERS IV SOLN
INTRAVENOUS | Status: DC | PRN
  Administered 2018-11-06: 15:00:00 via INTRAVENOUS

## 2018-11-06 MED ORDER — POVIDONE-IODINE 10 % EX SWAB: 2.0000 "application " | Freq: Once | CUTANEOUS | Status: DC

## 2018-11-06 MED ORDER — ONDANSETRON HCL 4 MG/2ML IJ SOLN: 4.0000 mg | Freq: Four times a day (QID) | INTRAMUSCULAR | Status: DC | PRN

## 2018-11-06 MED ORDER — MIDAZOLAM HCL 5 MG/5ML IJ SOLN
INTRAMUSCULAR | Status: DC | PRN
  Administered 2018-11-06: 2 mg via INTRAVENOUS

## 2018-11-06 MED ORDER — VANCOMYCIN HCL 1000 MG IV SOLR
INTRAVENOUS | Status: DC | PRN
  Administered 2018-11-06: 1000 mg

## 2018-11-06 MED ORDER — HYDROMORPHONE HCL 1 MG/ML IJ SOLN
0.2500 mg | INTRAMUSCULAR | Status: DC | PRN
  Administered 2018-11-06 (×4): 0.5 mg via INTRAVENOUS

## 2018-11-06 MED ORDER — FENTANYL CITRATE (PF) 100 MCG/2ML IJ SOLN
25.0000 ug | INTRAMUSCULAR | Status: DC | PRN
  Administered 2018-11-06 (×2): 50 ug via INTRAVENOUS

## 2018-11-06 MED ORDER — FENTANYL CITRATE (PF) 250 MCG/5ML IJ SOLN
INTRAMUSCULAR | Status: DC | PRN
  Administered 2018-11-06 (×3): 50 ug via INTRAVENOUS
  Administered 2018-11-06: 100 ug via INTRAVENOUS

## 2018-11-06 MED ORDER — CEFAZOLIN SODIUM-DEXTROSE 2-4 GM/100ML-% IV SOLN
INTRAVENOUS | Status: AC
  Filled 2018-11-06: qty 100

## 2018-11-06 MED ORDER — PROPOFOL 10 MG/ML IV BOLUS
INTRAVENOUS | Status: DC | PRN
  Administered 2018-11-06: 110 mg via INTRAVENOUS

## 2018-11-06 MED ORDER — CEFAZOLIN SODIUM-DEXTROSE 2-4 GM/100ML-% IV SOLN
2.0000 g | INTRAVENOUS | Status: AC
  Administered 2018-11-06: 2 g via INTRAVENOUS

## 2018-11-06 MED ORDER — SODIUM CHLORIDE 0.9 % IR SOLN
Status: DC | PRN
  Administered 2018-11-06: 1000 mL

## 2018-11-06 MED ORDER — GENTAMICIN SULFATE 40 MG/ML IJ SOLN
INTRAMUSCULAR | Status: DC | PRN
  Administered 2018-11-06: 80 mg

## 2018-11-06 MED ORDER — ESMOLOL HCL 100 MG/10ML IV SOLN
INTRAVENOUS | Status: DC | PRN
  Administered 2018-11-06: 20 mg via INTRAVENOUS
  Administered 2018-11-06: 10 mg via INTRAVENOUS

## 2018-11-06 SURGICAL SUPPLY — 47 items
BANDAGE ACE 3X5.8 VEL STRL LF (GAUZE/BANDAGES/DRESSINGS) ×2 IMPLANT
BANDAGE ACE 4X5 VEL STRL LF (GAUZE/BANDAGES/DRESSINGS) ×3 IMPLANT
BNDG CMPR 9X4 STRL LF SNTH (GAUZE/BANDAGES/DRESSINGS)
BNDG CONFORM 2 STRL LF (GAUZE/BANDAGES/DRESSINGS) IMPLANT
BNDG ESMARK 4X9 LF (GAUZE/BANDAGES/DRESSINGS) IMPLANT
BNDG GAUZE ELAST 4 BULKY (GAUZE/BANDAGES/DRESSINGS) ×4 IMPLANT
CORDS BIPOLAR (ELECTRODE) IMPLANT
COVER SURGICAL LIGHT HANDLE (MISCELLANEOUS) ×4 IMPLANT
COVER WAND RF STERILE (DRAPES) ×2 IMPLANT
CUFF TOURNIQUET SINGLE 18IN (TOURNIQUET CUFF) ×2 IMPLANT
DECANTER SPIKE VIAL GLASS SM (MISCELLANEOUS) ×2 IMPLANT
DRAPE SURG 17X23 STRL (DRAPES) ×2 IMPLANT
DRSG XEROFORM 1X8 (GAUZE/BANDAGES/DRESSINGS) ×1 IMPLANT
DURAPREP 26ML APPLICATOR (WOUND CARE) ×2 IMPLANT
GAUZE PACKING IODOFORM 1/4X15 (GAUZE/BANDAGES/DRESSINGS) IMPLANT
GAUZE PACKING IODOFORM 1/4X5 (PACKING) IMPLANT
GAUZE SPONGE 4X4 12PLY STRL (GAUZE/BANDAGES/DRESSINGS) ×3 IMPLANT
GAUZE XEROFORM 1X8 LF (GAUZE/BANDAGES/DRESSINGS) ×2 IMPLANT
GLOVE SURG SYN 8.0 (GLOVE) ×2 IMPLANT
GLOVE SURG SYN 8.0 PF PI (GLOVE) ×1 IMPLANT
GOWN STRL REUS W/ TWL LRG LVL3 (GOWN DISPOSABLE) ×1 IMPLANT
GOWN STRL REUS W/ TWL XL LVL3 (GOWN DISPOSABLE) ×1 IMPLANT
GOWN STRL REUS W/TWL LRG LVL3 (GOWN DISPOSABLE) ×2
GOWN STRL REUS W/TWL XL LVL3 (GOWN DISPOSABLE) ×2
KIT BASIN OR (CUSTOM PROCEDURE TRAY) ×2 IMPLANT
KIT STIMULAN RAPID CURE 5CC (Orthopedic Implant) ×1 IMPLANT
KIT TURNOVER KIT B (KITS) ×2 IMPLANT
MANIFOLD NEPTUNE II (INSTRUMENTS) ×2 IMPLANT
NDL HYPO 25GX1X1/2 BEV (NEEDLE) IMPLANT
NDL HYPO 25X1 1.5 SAFETY (NEEDLE) ×1 IMPLANT
NEEDLE HYPO 25GX1X1/2 BEV (NEEDLE) IMPLANT
NEEDLE HYPO 25X1 1.5 SAFETY (NEEDLE) ×2 IMPLANT
NS IRRIG 1000ML POUR BTL (IV SOLUTION) ×2 IMPLANT
PACK ORTHO EXTREMITY (CUSTOM PROCEDURE TRAY) ×2 IMPLANT
PAD ARMBOARD 7.5X6 YLW CONV (MISCELLANEOUS) ×4 IMPLANT
PAD CAST 4YDX4 CTTN HI CHSV (CAST SUPPLIES) ×2 IMPLANT
PADDING CAST COTTON 4X4 STRL (CAST SUPPLIES) ×4
SPONGE LAP 18X18 RF (DISPOSABLE) ×2 IMPLANT
SUT VICRYL RAPIDE 4/0 PS 2 (SUTURE) IMPLANT
SWAB COLLECTION DEVICE MRSA (MISCELLANEOUS) ×2 IMPLANT
SWAB CULTURE ESWAB REG 1ML (MISCELLANEOUS) IMPLANT
SYR CONTROL 10ML LL (SYRINGE) ×2 IMPLANT
TOWEL OR 17X24 6PK STRL BLUE (TOWEL DISPOSABLE) ×2 IMPLANT
TOWEL OR 17X26 10 PK STRL BLUE (TOWEL DISPOSABLE) ×2 IMPLANT
TUBE CONNECTING 12X1/4 (SUCTIONS) ×2 IMPLANT
UNDERPAD 30X30 (UNDERPADS AND DIAPERS) ×2 IMPLANT
YANKAUER SUCT BULB TIP NO VENT (SUCTIONS) ×2 IMPLANT

## 2018-11-06 NOTE — Consult Note (Signed)
[facility]    Date of Admission:  11/05/2018     Total days of antibiotics 2               Reason for Consult: Septic arthritis of the left elbow   Referring Provider: Starla Link Primary Care Provider: Glenda Chroman, MD   Assessment/Plan:  [name] is a 63 year old male with previous medical history of hypertension, hepatitis C, alcohol abuse, inflammatory arthropathy and subdural hematoma status post craniotomy in July 2019 readmitted for repeat irrigation and debridement of his left elbow having increased left arm pain and drainage status post irrigation and debridement on 4/24 with cultures positive for MRSA and treated outpatient with Bactrim starting about 3 days ago.  Concern for osteomyelitis of the distal humerus and proximal radial head.  MRSA infection of the left elbow -outpatient treatment failed with Bactrim now requiring second irrigation and debridement scheduled for today.  Currently on vancomycin.  No need prolonged course of antimicrobial therapy given concern for osteomyelitis.  Recommendations pending surgery.  Type 2 diabetes -poorly controlled with most recent A1c of 11.5 on 09/02/2018.  Discussed with patient importance of blood sugar control as this can related to increased risk for complicated healing and further infection in the future.  Continue management per primary team.  Hepatitis C -review of previous blood work shows genotype 1a hepatitis C with a viral load of 2.47 million in November 2018.  He is treatment nave.  Recommend outpatient treatment if viral load persists.  Healthcare maintenance - HIV negative.  Tobacco use - discussed importance of tobacco cessation to reduce risk of complicated wound healing and respiratory, cardiovascular, and malignant diseases in the future.  Currently with nicotine patch.  Continue treatment per primary team.   Principal Problem:   Infected elbow ([place]) Active Problems:   Diabetes mellitus  with hyperglycemia ([place])   Thrombocytopenia ([place])   Tobacco abuse   Chronic hepatitis C without hepatic coma ([place])   . Chlorhexidine Gluconate Cloth  6 each Topical Q0600  . insulin aspart  0-15 Units Subcutaneous TID WC  . insulin aspart  0-5 Units Subcutaneous QHS  . insulin detemir  10 Units Subcutaneous QHS  . mupirocin ointment  1 application Nasal BID  . nicotine  21 mg Transdermal Daily  . sodium chloride flush  10-40 mL Intracatheter Q12H  . sodium chloride flush  3 mL Intravenous Q12H     HPI: Juan Juarez is a 63 y.o. male with previous medical history of seizures, polysubstance abuse, inflammatory arthropathy, hepatitis C, and type 2 diabetes admitted with worsening left arm pain and drainage.  Mr. Hinch was seen in the ID office yesterday by Dr. Prince Rome and recommended to come to the hospital for repeat incision and drainage of his left elbow.   Mr. Sofranko was initially seen outpatient on 10/23/2018 with 5 to 32-month history of left elbow pain, swelling, and drainage.  He had previously seen an orthopedic surgeon in [facility] and had MRI performed on 07/18/2018 with multiloculated fluid collections anteriorly in the antecubital fossa area extending down into the proximal forearm.  He has no systemic symptoms at the time.  He underwent irrigation and debridement on 10/24/2018 with pathology noting chronic inflammatory tissue.  Surgical specimens with cultures positive for MRSA infection.  He was placed on Bactrim 3 days ago during follow up and referred to infectious disease.  X-rays during follow-up on 10/28/2018 with concern for chronic osteomyelitis involving  the distal humerus and radial head.  Directly admitted to the hospital for secondary/repeat irrigation and debridement of the left elbow.  Blood cultures obtained on admission from 5/6 with no growth in less than 24 hours.  He has been afebrile with no leukocytosis.  Plans to go to the OR today.  ID has been consulted  for antibiotic/treatment recommendations.  Review of Systems: Review of Systems  Constitutional: Negative for chills, fever and weight loss.  Respiratory: Negative for cough, shortness of breath and wheezing.   Cardiovascular: Negative for chest pain and leg swelling.  Gastrointestinal: Negative for abdominal pain, constipation, diarrhea, nausea and vomiting.  Musculoskeletal:       Positive for left elbow pain  Skin: Negative for rash.     Past Medical History:  Diagnosis Date  . Anxiety   . Chronic elbow pain, left   . Depression   . Diabetes mellitus, type II ([place])    type II  . Gout   . Head injury 12/2017  . Hepatitis C without hepatic coma   . Hypertension    denies  . Inflammatory arthropathy    left elbow  . Insomnia   . Non compliance w medication regimen   . Osteoarthritis   . Polysubstance abuse ([place])   . Protein-calorie malnutrition, severe ([place]) 09/13/2016  . Seizure ([place]) 12/2017   after head Injury  . Thrombocytopenia ([facility])     Social History   Tobacco Use  . Smoking status: Current Every Day Smoker    Packs/day: 0.50    Years: 50.00    Pack years: 25.00    Types: Cigarettes  . Smokeless tobacco: Never Used  Substance Use Topics  . Alcohol use: Not Currently    Frequency: Never  . Drug use: Not Currently    Types: Marijuana, Cocaine    Comment: cocaine last use 08/2018, Marijuana sping 2019    Family History  Problem Relation Age of Onset  . Thyroid disease Sister   . Colon cancer Neg Hx   . Colon polyps Neg Hx     No Known Allergies  OBJECTIVE: Blood pressure 138/89, pulse 91, temperature 98.4 F (36.9 C), resp. rate 18, height 5\' 7"  (1.702 m), weight 73.1 kg, SpO2 98 %.  Physical Exam Constitutional:      General: He is not in acute distress.    Appearance: He is well-developed.     Comments: Lying in bed with head of bed elevated; pleasant  Cardiovascular:     Rate and Rhythm: Normal rate and regular rhythm.     Heart sounds:  Murmur present. Systolic murmur present with a grade of 1/6.     Comments: Midline noted in right arm with dressing that is clean and dry.  Site without evidence of infection. Pulmonary:     Effort: Pulmonary effort is normal.     Breath sounds: Normal breath sounds.  Musculoskeletal:     Comments: Dressing on left elbow is clean and dry.  Left elbow appears edematous.  Distal pulses and sensation are intact and appropriate.  Skin:    General: Skin is warm and dry.  Neurological:     Mental Status: He is alert and oriented to person, place, and time.  Psychiatric:        Mood and Affect: Mood normal.     Lab Results Lab Results  Component Value Date   WBC 5.4 11/06/2018   HGB 12.1 (L) 11/06/2018   HCT 33.9 (L) 11/06/2018   MCV  89.7 11/06/2018   PLT 120 (L) 11/06/2018    Lab Results  Component Value Date   CREATININE 0.80 11/06/2018   BUN 11 11/06/2018   NA 132 (L) 11/06/2018   K 4.2 11/06/2018   CL 101 11/06/2018   CO2 24 11/06/2018    Lab Results  Component Value Date   ALT 44 09/03/2018   AST 70 (H) 09/03/2018   ALKPHOS 97 09/03/2018   BILITOT 0.8 09/03/2018     Microbiology: Recent Results (from the past 240 hour(s))  Surgical pcr screen     Status: Abnormal   Collection Time: 11/05/18  4:30 PM  Result Value Ref Range Status   MRSA, PCR POSITIVE (A) NEGATIVE Final    Comment: RESULT CALLED TO, READ BACK BY AND VERIFIED WITHDaron Offer RN 1957 11/05/18 A BROWNING    Staphylococcus aureus POSITIVE (A) NEGATIVE Final    Comment: (NOTE) The Xpert SA Assay (FDA approved for NASAL specimens in patients 6 years of age and older), is one component of a comprehensive surveillance program. It is not intended to diagnose infection nor to guide or monitor treatment. Performed at [facility], [place] [address] [place], [place] [address]   Culture, blood (routine x 2)     Status: None (Preliminary result)   Collection Time: 11/05/18  5:10 PM  Result Value Ref  Range Status   Specimen Description BLOOD RIGHT ANTECUBITAL  Final   Special Requests   Final    BOTTLES DRAWN AEROBIC ONLY Blood Culture adequate volume   Culture   Final    NO GROWTH < 12 HOURS Performed at [facility], [place] [address] [place], [place] [address]    Report Status PENDING  Incomplete  Culture, blood (routine x 2)     Status: None (Preliminary result)   Collection Time: 11/05/18  5:15 PM  Result Value Ref Range Status   Specimen Description BLOOD RIGHT ANTECUBITAL  Final   Special Requests   Final    BOTTLES DRAWN AEROBIC ONLY Blood Culture adequate volume   Culture   Final    NO GROWTH < 12 HOURS Performed at [facility], [place] [address] [place], [place] [address]    Report Status PENDING  Incomplete     Terri Piedra, [place] for [place] Pager  11/06/2018  9:54 AM

## 2018-11-06 NOTE — Interval H&P Note (Signed)
History and Physical Interval Note:  11/06/2018 2:16 PM  Juan Juarez  has presented today for surgery, with the diagnosis of CHRONIC OSTEOMYELITIS LEFT ELBOW.  The various methods of treatment have been discussed with the patient and family. After consideration of risks, benefits and other options for treatment, the patient has consented to  Procedure(s): IRRIGATION AND DEBRIDEMENT LEFT ELBOW JOINT (Left) as a surgical intervention.  The patient's history has been reviewed, patient examined, no change in status, stable for surgery.  I have reviewed the patient's chart and labs.  Questions were answered to the patient's satisfaction.     Schuyler Amor

## 2018-11-06 NOTE — Transfer of Care (Signed)
Immediate Anesthesia Transfer of Care Note  Patient: Juan Juarez  Procedure(s) Performed: IRRIGATION AND DEBRIDEMENT LEFT ELBOW JOINT (Left )  Patient Location: PACU  Anesthesia Type:General  Level of Consciousness: drowsy and patient cooperative  Airway & Oxygen Therapy: Patient Spontanous Breathing and Patient connected to nasal cannula oxygen  Post-op Assessment: Report given to RN, Post -op Vital signs reviewed and stable and Patient moving all extremities  Post vital signs: Reviewed and stable  Last Vitals:  Vitals Value Taken Time  BP 194/98 11/06/2018  4:25 PM  Temp    Pulse 92 11/06/2018  4:26 PM  Resp 22 11/06/2018  4:26 PM  SpO2 100 % 11/06/2018  4:26 PM  Vitals shown include unvalidated device data.  Last Pain:  Vitals:   11/06/18 1358  TempSrc:   PainSc: 10-Worst pain ever      Patients Stated Pain Goal: 3 (69/45/03 8882)  Complications: No apparent anesthesia complications

## 2018-11-06 NOTE — Op Note (Signed)
Please see dictated report [phone]

## 2018-11-06 NOTE — Progress Notes (Signed)
Patient ID: Juan Juarez, male   DOB: Jan 23, 1956, 63 y.o.   MRN: [id number]  PROGRESS NOTE    Juan Juarez  MBW:[id number] DOB: [birth year] DOA: 11/05/2018 PCP: Glenda Chroman, MD   Brief Narrative:  63 year old male with history of hypertension, hyperlipidemia, hepatitis C, diabetes mellitus type 2, inflammatory arthropathy, alcohol abuse, SDH status post craniotomy in 12/2017, recent I&D with biopsy of left forearm performed by Dr. Burney Gauze on 10/24/2018 with subsequent cultures positive for MRSA presented with increased left arm pain and drainage.  He had followed up with Dr. Paulino Door who spoke to Dr. Burney Gauze and recommended repeat I&D.  He was started on IV antibiotics.  Assessment & Plan:   Principal Problem:   Infected elbow ([place]) Active Problems:   Diabetes mellitus with hyperglycemia ([facility])   Thrombocytopenia ([place])   Tobacco abuse   Chronic hepatitis C without hepatic coma ([facility])   Probable MRSA left elbow infection -Patient had recent I&D with biopsy done on 10/24/2018: Pathology revealed chronic inflammatory tissue with no evidence of gout.  Cultures positive for MRSA and patient was started on Bactrim on 11/04/2018 but because of worsening pain and drainage, he was referred for IV antibiotics and need for I&D -Continue n.p.o.  Continue IV vancomycin.  Await ID recommendations.  We will follow-up with further recommendations from Dr. Burney Gauze.  Diabetes mellitus type 2 uncontrolled with hyperglycemia -A1c was 11.5 on 09/02/2018.  Oral meds on hold.  Continue Levemir.  Continue CBGs with SSI.  Tobacco abuse -Continue nicotine patch.  Patient needs to stop smoking  History of hepatitis C and cirrhosis -Outpatient follow-up with PCP.  No evidence of ascites on physical exam  Chronic thrombocytopenia -No signs of bleeding.  Monitor   DVT prophylaxis: SCDs Code Status: Full Family Communication: None at bedside Disposition Plan: Depends on clinical outcome  Consultants:  Orthopedic/ID  Procedures: None  Antimicrobials: Vancomycin from 11/05/2018 onwards   Subjective: Patient seen and examined at bedside.  He denies any worsening overnight elbow pain.  No overnight fever or vomiting.  Objective: Vitals:   11/05/18 1651 11/05/18 2211 11/06/18 0608  BP: (!) 148/85 (!) 148/76 138/89  Pulse: 85 79 91  Resp: 19 18 18   Temp: 98 F (36.7 C) 98.1 F (36.7 C) 98.4 F (36.9 C)  TempSrc: Oral    SpO2: 100% 99% 98%  Weight: 73.1 kg    Height: 5\' 7"  (1.702 m)      Intake/Output Summary (Last 24 hours) at 11/06/2018 0944 Last data filed at 11/06/2018 0848 Gross per 24 hour  Intake 10 ml  Output -  Net 10 ml   Filed Weights   11/05/18 1651  Weight: 73.1 kg    Examination:  General exam: Appears calm and comfortable  Respiratory system: Bilateral decreased breath sounds at bases Cardiovascular system: S1 & S2 heard, Rate controlled Gastrointestinal system: Abdomen is nondistended, soft and nontender. Normal bowel sounds heard. Extremities: No cyanosis, clubbing; left elbow dressing present   Data Reviewed: I have personally reviewed following labs and imaging studies  CBC: Recent Labs  Lab 11/05/18 1710 11/06/18 0300  WBC 6.8 5.4  HGB 12.1* 12.1*  HCT 34.0* 33.9*  MCV 90.7 89.7  PLT 134* 017*   Basic Metabolic Panel: Recent Labs  Lab 11/05/18 1710 11/06/18 0300  NA 130* 132*  K 3.8 4.2  CL 96* 101  CO2 23 24  GLUCOSE 385* 266*  BUN 12 11  CREATININE 0.90 0.80  CALCIUM 8.3* 8.3*   GFR: Estimated Creatinine  Clearance: 87.2 mL/min (by C-G formula based on SCr of 0.8 mg/dL). Liver Function Tests: No results for input(s): AST, ALT, ALKPHOS, BILITOT, PROT, ALBUMIN in the last 168 hours. No results for input(s): LIPASE, AMYLASE in the last 168 hours. No results for input(s): AMMONIA in the last 168 hours. Coagulation Profile: Recent Labs  Lab 11/06/18 0300  INR 1.2   Cardiac Enzymes: No results for input(s): CKTOTAL, CKMB,  CKMBINDEX, TROPONINI in the last 168 hours. BNP (last 3 results) No results for input(s): PROBNP in the last 8760 hours. HbA1C: No results for input(s): HGBA1C in the last 72 hours. CBG: No results for input(s): GLUCAP in the last 168 hours. Lipid Profile: No results for input(s): CHOL, HDL, LDLCALC, TRIG, CHOLHDL, LDLDIRECT in the last 72 hours. Thyroid Function Tests: No results for input(s): TSH, T4TOTAL, FREET4, T3FREE, THYROIDAB in the last 72 hours. Anemia Panel: No results for input(s): VITAMINB12, FOLATE, FERRITIN, TIBC, IRON, RETICCTPCT in the last 72 hours. Sepsis Labs: Recent Labs  Lab 11/05/18 1710  LATICACIDVEN 1.7    Recent Results (from the past 240 hour(s))  Surgical pcr screen     Status: Abnormal   Collection Time: 11/05/18  4:30 PM  Result Value Ref Range Status   MRSA, PCR POSITIVE (A) NEGATIVE Final    Comment: RESULT CALLED TO, READ BACK BY AND VERIFIED WITHDaron Offer RN [phone] 11/05/18 A BROWNING    Staphylococcus aureus POSITIVE (A) NEGATIVE Final    Comment: (NOTE) The Xpert SA Assay (FDA approved for NASAL specimens in patients 12 years of age and older), is one component of a comprehensive surveillance program. It is not intended to diagnose infection nor to guide or monitor treatment. Performed at [facility], [place] [address] [place], [place] [address]   Culture, blood (routine x 2)     Status: None (Preliminary result)   Collection Time: 11/05/18  5:10 PM  Result Value Ref Range Status   Specimen Description BLOOD RIGHT ANTECUBITAL  Final   Special Requests   Final    BOTTLES DRAWN AEROBIC ONLY Blood Culture adequate volume   Culture   Final    NO GROWTH < 12 HOURS Performed at [facility], [place] [address] [place], [place] [address]    Report Status PENDING  Incomplete  Culture, blood (routine x 2)     Status: None (Preliminary result)   Collection Time: 11/05/18  5:15 PM  Result Value Ref Range Status   Specimen Description  BLOOD RIGHT ANTECUBITAL  Final   Special Requests   Final    BOTTLES DRAWN AEROBIC ONLY Blood Culture adequate volume   Culture   Final    NO GROWTH < 12 HOURS Performed at [facility], [place] [address] [place], [place] [address]    Report Status PENDING  Incomplete         Radiology Studies: Dg Elbow Complete Left (3+view)  Result Date: 11/05/2018 CLINICAL DATA:  Elbow infection EXAM: LEFT ELBOW - COMPLETE 3+ VIEW COMPARISON:  09/27/2018 FINDINGS: In the interval, there has been erosion in bone destruction involving the radial head, portions of the lateral humeral condyle and proximal ulna likely related to septic arthritis and osteomyelitis. Elbow joint effusion. Diffuse soft tissue swelling. No dislocation. IMPRESSION: Bone destruction/erosion involving the distal lateral humeral condyle, proximal ulna and radial head compatible with osteomyelitis and likely septic arthritis. Large joint effusion and soft tissue swelling. Electronically Signed   By: Rolm Baptise M.D.  On: 11/05/2018 19:12        Scheduled Meds: . Chlorhexidine Gluconate Cloth  6 each Topical Q0600  . insulin aspart  0-15 Units Subcutaneous TID WC  . insulin aspart  0-5 Units Subcutaneous QHS  . insulin detemir  10 Units Subcutaneous QHS  . mupirocin ointment  1 application Nasal BID  . nicotine  21 mg Transdermal Daily  . sodium chloride flush  10-40 mL Intracatheter Q12H  . sodium chloride flush  3 mL Intravenous Q12H   Continuous Infusions: . vancomycin 1,000 mg (11/06/18 0622)     LOS: 1 day        Aline August, MD [facility] 11/06/2018, 9:44 AM

## 2018-11-06 NOTE — Progress Notes (Signed)
Dr Lanetta Inch here-updated re elev BP & IV Fentanyl not effective in pain relief. New orders for IV Dilaudid. Also, SBP 170-180 is OK.

## 2018-11-06 NOTE — Op Note (Signed)
NAME: ETHANJAMES, FONTENOT MEDICAL RECORD EM:[id number] ACCOUNT [id number] DATE OF BIRTH:10-Jan-1956 FACILITY: [facility] LOCATION: [facility] PHYSICIAN:Eilan Mcinerny A. 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  PROCEDURE:  Incision and drainage above, application of antibiotic impregnated beads.  SURGEON:  Charlotte Crumb, MD  ASSISTANT:  Dasnoit, PA  ANESTHESIA:  General.  COMPLICATIONS:  No complications.  DRAINS:  No drains.  DESCRIPTION OF PROCEDURE:  The patient was taken to the operating suite after induction of adequate general anesthetic.  Left upper extremity was prepped and draped in the usual sterile fashion.  The arm was elevated and the tourniquet was inflated to  250 mmHg.  We used the old lateral incision and dissected down to the radiocapitellar joint.  There was a bloody drainage, but no purulence was encountered.  We thoroughly debrided the hypertrophic and inflamed synovium out of the elbow joint and  thoroughly irrigated with a liter of normal saline.  We then placed gentamicin and vancomycin impregnated antibiotic beads into the joint.  We then loosely closed the joint with 2-0 Vicryl and the skin with 3-0 nylon.  We also opened anteriorly, using  his old incision and encountered serous fluid only.  We left these wounds open to drain.  These were 1 cm x 1 cm.  We then dressed with Xeroform, 4 x 4's, fluffs and a compression bandage.    The patient tolerated this procedure well and went to recovery room in stable fashion.  AN/NUANCE  D:11/06/2018 T:11/06/2018 JOB:[id number]

## 2018-11-06 NOTE — Progress Notes (Signed)
Patient underwent repeat incision and drainage of left elbow.  Patient had antibiotic impregnated beads with gentamicin and vancomycin placed into the radiocapitellar joint.  Patient had no purulence but bloody serous drainage only from both the lateral and anterior wounds.  These were loosely closed.  Will need to see my office next week for dressing change.

## 2018-11-06 NOTE — Anesthesia Preprocedure Evaluation (Addendum)
Anesthesia Evaluation  Patient identified by MRN, date of birth, ID band Patient awake    Reviewed: Allergy & Precautions, H&P , NPO status , Patient's Chart, lab work & pertinent test results  Airway Mallampati: II   Neck ROM: full    Dental  (+) Poor Dentition, Dental Advisory Given   Pulmonary Current Smoker,    breath sounds clear to auscultation       Cardiovascular hypertension,  Rhythm:regular Rate:Normal     Neuro/Psych Seizures -,  PSYCHIATRIC DISORDERS Anxiety Depression    GI/Hepatic (+) Hepatitis -, C  Endo/Other  diabetes  Renal/GU      Musculoskeletal  (+) Arthritis ,   Abdominal   Peds  Hematology   Anesthesia Other Findings   Reproductive/Obstetrics                            Anesthesia Physical Anesthesia Plan  ASA: III  Anesthesia Plan: General   Post-op Pain Management:    Induction: Intravenous  PONV Risk Score and Plan: 1 and Ondansetron, Dexamethasone, Midazolam and Treatment may vary due to age or medical condition  Airway Management Planned: LMA  Additional Equipment:   Intra-op Plan:   Post-operative Plan: Extubation in OR  Informed Consent: I have reviewed the patients History and Physical, chart, labs and discussed the procedure including the risks, benefits and alternatives for the proposed anesthesia with the patient or authorized representative who has indicated his/her understanding and acceptance.     Dental advisory given  Plan Discussed with: CRNA, Anesthesiologist and Surgeon  Anesthesia Plan Comments:        Anesthesia Quick Evaluation

## 2018-11-06 NOTE — Anesthesia Procedure Notes (Signed)
Procedure Name: LMA Insertion Date/Time: 11/06/2018 3:28 PM Performed by: Jearld Pies, CRNA Pre-anesthesia Checklist: Patient identified, Emergency Drugs available, Suction available and Patient being monitored Patient Re-evaluated:Patient Re-evaluated prior to induction Oxygen Delivery Method: Circle System Utilized Preoxygenation: Pre-oxygenation with 100% oxygen Induction Type: IV induction LMA: LMA inserted LMA Size: 4.0 Tube type: Oral Number of attempts: 1 Airway Equipment and Method: Stylet and Oral airway Placement Confirmation: positive ETCO2 and breath sounds checked- equal and bilateral Tube secured with: Tape Dental Injury: Teeth and Oropharynx as per pre-operative assessment

## 2018-11-07 ENCOUNTER — Encounter (HOSPITAL_COMMUNITY): Payer: Self-pay | Admitting: Orthopedic Surgery

## 2018-11-07 ENCOUNTER — Inpatient Hospital Stay: Payer: Self-pay

## 2018-11-07 DIAGNOSIS — B182 Chronic viral hepatitis C: Secondary | ICD-10-CM

## 2018-11-07 LAB — MAGNESIUM: Magnesium: 1.5 mg/dL — ABNORMAL LOW (ref 1.7–2.4)

## 2018-11-07 LAB — CBC WITH DIFFERENTIAL/PLATELET
Abs Immature Granulocytes: 0.03 10*3/uL (ref 0.00–0.07)
Basophils Absolute: 0 10*3/uL (ref 0.0–0.1)
Basophils Relative: 0 %
Eosinophils Absolute: 0 10*3/uL (ref 0.0–0.5)
Eosinophils Relative: 0 %
HCT: 37.1 % — ABNORMAL LOW (ref 39.0–52.0)
Hemoglobin: 13.5 g/dL (ref 13.0–17.0)
Immature Granulocytes: 1 %
Lymphocytes Relative: 17 %
Lymphs Abs: 1 10*3/uL (ref 0.7–4.0)
MCH: 32.2 pg (ref 26.0–34.0)
MCHC: 36.4 g/dL — ABNORMAL HIGH (ref 30.0–36.0)
MCV: 88.5 fL (ref 80.0–100.0)
Monocytes Absolute: 0.6 10*3/uL (ref 0.1–1.0)
Monocytes Relative: 10 %
Neutro Abs: 4.1 10*3/uL (ref 1.7–7.7)
Neutrophils Relative %: 72 %
Platelets: 147 10*3/uL — ABNORMAL LOW (ref 150–400)
RBC: 4.19 MIL/uL — ABNORMAL LOW (ref 4.22–5.81)
RDW: 14 % (ref 11.5–15.5)
WBC: 5.7 10*3/uL (ref 4.0–10.5)
nRBC: 0 % (ref 0.0–0.2)

## 2018-11-07 LAB — BASIC METABOLIC PANEL
Anion gap: 9 (ref 5–15)
BUN: 15 mg/dL (ref 8–23)
CO2: 21 mmol/L — ABNORMAL LOW (ref 22–32)
Calcium: 8.9 mg/dL (ref 8.9–10.3)
Chloride: 99 mmol/L (ref 98–111)
Creatinine, Ser: 0.86 mg/dL (ref 0.61–1.24)
GFR calc Af Amer: >60 mL/min (ref >60)
GFR calc non Af Amer: >60 mL/min (ref >60)
Glucose, Bld: 263 mg/dL — ABNORMAL HIGH (ref 70–99)
Potassium: 4.5 mmol/L (ref 3.5–5.1)
Sodium: 129 mmol/L — ABNORMAL LOW (ref 135–145)

## 2018-11-07 LAB — GLUCOSE, CAPILLARY
Glucose-Capillary: 453 mg/dL — ABNORMAL HIGH (ref 70–99)
Glucose-Capillary: 379 mg/dL — ABNORMAL HIGH (ref 70–99)
Glucose-Capillary: 350 mg/dL — ABNORMAL HIGH (ref 70–99)
Glucose-Capillary: 243 mg/dL — ABNORMAL HIGH (ref 70–99)
Glucose-Capillary: 257 mg/dL — ABNORMAL HIGH (ref 70–99)
Glucose-Capillary: 342 mg/dL — ABNORMAL HIGH (ref 70–99)

## 2018-11-07 LAB — C-REACTIVE PROTEIN: CRP: <0.8 mg/dL (ref <1.0)

## 2018-11-07 MED ORDER — INSULIN DETEMIR 100 UNIT/ML ~~LOC~~ SOLN
30.0000 [IU] | Freq: Every day | SUBCUTANEOUS | Status: DC
  Administered 2018-11-07: 09:00:00 30 [IU] via SUBCUTANEOUS
  Filled 2018-11-07 (×2): qty 0.3

## 2018-11-07 MED ORDER — INSULIN ASPART 100 UNIT/ML ~~LOC~~ SOLN
15.0000 [IU] | Freq: Once | SUBCUTANEOUS | Status: AC
  Administered 2018-11-07: 15 [IU] via SUBCUTANEOUS

## 2018-11-07 MED ORDER — MAGNESIUM SULFATE 2 GM/50ML IV SOLN
2.0000 g | Freq: Once | INTRAVENOUS | Status: AC
  Administered 2018-11-07: 2 g via INTRAVENOUS
  Filled 2018-11-07: qty 50

## 2018-11-07 MED ORDER — INSULIN ASPART 100 UNIT/ML ~~LOC~~ SOLN
5.0000 [IU] | Freq: Three times a day (TID) | SUBCUTANEOUS | Status: DC
  Administered 2018-11-07 (×3): 5 [IU] via SUBCUTANEOUS

## 2018-11-07 MED ORDER — ALPRAZOLAM 0.25 MG PO TABS
0.2500 mg | ORAL_TABLET | Freq: Two times a day (BID) | ORAL | Status: DC | PRN
  Administered 2018-11-07 – 2018-11-27 (×10): 0.25 mg via ORAL
  Filled 2018-11-07 (×10): qty 1

## 2018-11-07 NOTE — Evaluation (Signed)
Physical Therapy Evaluation Patient Details Name: Juan Juarez MRN: [id number] DOB: 02-15-1956 Today's Date: 11/07/2018   History of Present Illness  Pt is a 63 y/o male admitted secondary to L elbow pain with infection, now s/p Incision and drainage above, application of antibiotic impregnated beads. PMH including but not limited to DM, HTN, polysubstance abuse and head injury s/p burr hole.    Clinical Impression  Pt presented standing in room upon arrival. Prior to admission, pt reported that he was independent with all functional mobility and ADLs. Pt lives alone (stated that his girlfriend recently passed away from cancer) and has family that can be there to assist him if needed. At the time of evaluation, pt at min guard level overall mostly due to poor safety awareness and impulsivity. Pt would continue to benefit from skilled physical therapy services at this time while admitted and after d/c to address the below listed limitations in order to improve overall safety and independence with functional mobility.     Follow Up Recommendations Home health PT    Equipment Recommendations  None recommended by PT    Recommendations for Other Services       Precautions / Restrictions Precautions Precautions: None Precaution Comments: L UE splinted      Mobility  Bed Mobility               General bed mobility comments: pt OOB standing in room upon arrival  Transfers Overall transfer level: Needs assistance Equipment used: None Transfers: Sit to/from Stand Sit to Stand: Min guard         General transfer comment: for safety as pt impulsive  Ambulation/Gait Ambulation/Gait assistance: Min guard Gait Distance (Feet): 20 Feet Assistive device: None       General Gait Details: pt shuffling around in room, attempting to manuever objects and rearrange things within his room; then shuffling over to his closet while his IV pole was still attached and plugged into the wall  on the opposite side of the room  Stairs            Wheelchair Mobility    Modified Rankin (Stroke Patients Only)       Balance Overall balance assessment: Needs assistance Sitting-balance support: Feet supported Sitting balance-Leahy Scale: Good     Standing balance support: No upper extremity supported Standing balance-Leahy Scale: Fair                               Pertinent Vitals/Pain Pain Assessment: Faces Faces Pain Scale: Hurts little more Pain Location: L elbow Pain Descriptors / Indicators: Sore Pain Intervention(s): Monitored during session;Repositioned    Home Living Family/patient expects to be discharged to:: Private residence Living Arrangements: Alone Available Help at Discharge: Family;Available PRN/intermittently Type of Home: House Home Access: Stairs to enter   [employer] of Steps: 3 Home Layout: One level Home Equipment: None      Prior Function Level of Independence: Independent               Hand Dominance   Dominant Hand: Right    Extremity/Trunk Assessment   Upper Extremity Assessment Upper Extremity Assessment: LUE deficits/detail LUE Deficits / Details: L UE splinted    Lower Extremity Assessment Lower Extremity Assessment: Overall WFL for tasks assessed    Cervical / Trunk Assessment Cervical / Trunk Assessment: Normal  Communication   Communication: No difficulties  Cognition Arousal/Alertness: Awake/alert Behavior During Therapy: Impulsive;Restless Overall  Cognitive Status: Impaired/Different from baseline Area of Impairment: Safety/judgement;Problem solving                         Safety/Judgement: Decreased awareness of safety   Problem Solving: Difficulty sequencing;Requires verbal cues        General Comments      Exercises     Assessment/Plan    PT Assessment Patient needs continued PT services  PT Problem List Decreased range of motion;Decreased  balance;Decreased mobility;Decreased coordination;Decreased knowledge of use of DME;Decreased safety awareness;Decreased knowledge of precautions;Pain       PT Treatment Interventions DME instruction;Gait training;Stair training;Functional mobility training;Therapeutic activities;Therapeutic exercise;Balance training;Neuromuscular re-education;Patient/family education    PT Goals (Current goals can be found in the Care Plan section)  Acute Rehab PT Goals Patient Stated Goal: be independent PT Goal Formulation: With patient Time For Goal Achievement: 11/21/18 Potential to Achieve Goals: Good    Frequency Min 3X/week   Barriers to discharge        Co-evaluation               AM-PAC PT "6 Clicks" Mobility  Outcome Measure Help needed turning from your back to your side while in a flat bed without using bedrails?: A Little Help needed moving from lying on your back to sitting on the side of a flat bed without using bedrails?: A Little Help needed moving to and from a bed to a chair (including a wheelchair)?: A Little Help needed standing up from a chair using your arms (e.g., wheelchair or bedside chair)?: A Little Help needed to walk in hospital room?: A Little Help needed climbing 3-5 steps with a railing? : A Little 6 Click Score: 18    End of Session   Activity Tolerance: Patient tolerated treatment well Patient left: in bed;with call bell/phone within reach;Other (comment)(sitting EOB to eat lunch) Nurse Communication: Mobility status PT Visit Diagnosis: Other abnormalities of gait and mobility (R26.89);Pain Pain - Right/Left: Left Pain - part of body: Arm    Time: 1250-1308 PT Time Calculation (min) (ACUTE ONLY): 18 min   Charges:   PT Evaluation $PT Eval Moderate Complexity: 1 Mod          Sherie Don, PT, DPT  Acute Rehabilitation Services Pager [phone] Office [place] 11/07/2018, 2:00 PM

## 2018-11-07 NOTE — Progress Notes (Addendum)
Inpatient Diabetes Program Recommendations  AACE/ADA: New Consensus Statement on Inpatient Glycemic Control (2015)  Target Ranges:  Prepandial:   less than 140 mg/dL      Peak postprandial:   less than 180 mg/dL (1-2 hours)      Critically ill patients:  140 - 180 mg/dL   Lab Results  Component Value Date   GLUCAP 350 (H) 11/07/2018   HGBA1C 11.5 (H) 09/02/2018    Review of Glycemic Control Results for Juan Juarez, Juan Juarez (MRN [id number]) as of 11/07/2018 10:50  Ref. Range 11/06/2018 22:06 11/07/2018 01:22 11/07/2018 02:43 11/07/2018 07:56  Glucose-Capillary Latest Ref Range: 70 - 99 mg/dL 501 (HH) 453 (H) 379 (H) 350 (H)   Diabetes history: Type 2 DM Outpatient Diabetes medications: Metformin 500 mg BID, Levemir 40 units QD, Glipizide 5 mg QD Current orders for Inpatient glycemic control: Levemir 30 units QD, Novolog 5 units TID, Novolog 0-15 units TID, Novolog 0-5 units QHS  Decadron 10 mg x 1 on 5/7  Inpatient Diabetes Program Recommendations:    Noted increases to glucose trends following Decadron administration. Agree with current orders and will continue to follow trends.   Spoke with patient regarding outpatient diabetes management. Patient has been seen by Dr Dorris Fetch, endocrinology, however, last appointment was in 2018. He lives in [place] and has a hard time with transportation to [place] area. Verified home medications and patient reports taking insulin as prescribed. Occasionally, will forget doses.  Reviewed patient's current A1c of 11.5%. Explained what a A1c is and what it measures. Also reviewed goal A1c with patient, importance of good glucose control @ home, and blood sugar goals. Reviewed patho of DM, need for insulin, role of pancreas, impact on glucose with infection and steroids, vascular changes and commorbidities. Patient has a meter and supplies, but rarely uses them. He checks occasionally when he thinks about it and it ranges from 100-200 mg/dL. Denies drinking sugary  beverages and prepares meals at home. When discussing meals, patient has good sources of protein, tries to be mindful of carb intake and drinks diet drinks. When it comes to injections, ensured patient was rotating sites. Patient uses vial and syringe method and had questions related to length of time insulin lasted. Reviewed how to store insulin and how long vials are good for once opened.  PCP manages DM, however, he has not seen PCP "in awhile" and patient just continues to take insulin. Encouraged to begin checking CBGs 2 times per day and discussed when to call MD. Reviewed suggestions on how to imbed checking CBGs and injections into routine, so that they are not missed. Will place suggestions in DC summary. Stressed the importance of long term follow up with PCP for DM management. Patient is not interested in traveling outside of [place].  Patient has no further questions at this time.   Thanks, Bronson Curb, MSN, RNC-OB Diabetes Coordinator [phone] (8a-5p)

## 2018-11-07 NOTE — TOC Initial Note (Signed)
Transition of Care [facility]) - Initial/Assessment Note    Patient Details  Name: Juan Juarez MRN: [id number] Date of Birth: 08-13-1955  Transition of [facility]) CM/SW Contact:    Marilu Favre, RN Phone Number: 11/07/2018, 5:18 PM  Clinical Narrative:                 Patient from home alone , has friend who can help. Wants [facility] and [facility], patient aware he will be shown how to administer Vancomycin   Will need HHRN and HHPT orders    Expected Discharge Plan: [place] Barriers to Discharge: Continued Medical Work up   Patient Goals and CMS Choice Patient states their goals for this hospitalization and ongoing recovery are:: to go home  CMS Medicare.gov Compare Post Acute Care list provided to:: Patient Choice offered to / list presented to : Patient  Expected Discharge Plan and Services Expected Discharge Plan: [place]   Discharge Planning Services: CM Consult Post Acute Care Choice: [place] arrangements for the past 2 months: Single Family Home                 DME Arranged: N/A DME Agency: NA       HH Arranged: RN, PT [place] Agency: [place] Date [place]: 11/07/18 Time [place]: 1717 Representative spoke with at [place]: Tommi Rumps   Prior Living Arrangements/Services Living arrangements for the past 2 months: [place] Lives with:: Self Patient language and need for interpreter reviewed:: Yes Do you feel safe going back to the place where you live?: Yes      Need for Family Participation in Patient Care: Yes (Comment) Care giver support system in place?: Yes (comment)   Criminal Activity/Legal Involvement Pertinent to Current Situation/Hospitalization: No - Comment as needed  Activities of Daily Living Home Assistive Devices/Equipment: None ADL Screening (condition at time of admission) Patient's cognitive ability adequate to safely complete  daily activities?: Yes Is the patient deaf or have difficulty hearing?: Yes Does the patient have difficulty seeing, even when wearing glasses/contacts?: No Does the patient have difficulty concentrating, remembering, or making decisions?: No Patient able to express need for assistance with ADLs?: Yes Does the patient have difficulty dressing or bathing?: Yes Independently performs ADLs?: No Communication: Independent Dressing (OT): Needs assistance(due to arm pain) Is this a change from baseline?: Change from baseline, expected to last <3days Grooming: Needs assistance Is this a change from baseline?: Change from baseline, expected to last <3 days Feeding: Independent Bathing: Needs assistance Is this a change from baseline?: Change from baseline, expected to last <3 days Toileting: Needs assistance Is this a change from baseline?: Change from baseline, expected to last <3 days In/Out Bed: Needs assistance Is this a change from baseline?: Change from baseline, expected to last <3 days Walks in Home: Independent Does the patient have difficulty walking or climbing stairs?: No Weakness of Legs: None Weakness of Arms/Hands: Left  Permission Sought/Granted   Permission granted to share information with : Yes, Verbal Permission Granted     Permission granted to share info w AGENCY: [facility], [facility]         Emotional Assessment Appearance:: Appears stated age   Affect (typically observed): Accepting Orientation: : Oriented to Self, Oriented to Place, Oriented to  Time, Oriented to Situation   Psych Involvement: No (comment)  Admission diagnosis:  infection lt elbow Patient Active Problem List  Diagnosis Date Noted  . Infected elbow ([place]) 11/05/2018  . Hyperosmolar (nonketotic) coma ([place]) 09/02/2018  . Type 2 diabetes mellitus with hyperglycemia, with long-term current use of insulin ([place]) 09/02/2018  . Status epilepticus ([place]) 01/22/2018  . Macrocytic anemia  01/22/2018  . Seizure ([place]) 01/22/2018  . Subdural bleeding ([place]) 01/20/2018  . Subdural hematoma ([place]) 12/31/2017  . Chronic hepatitis C without hepatic coma ([place]) 12/31/2017  . Alcoholic cirrhosis of liver without ascites ([place]) 12/31/2017  . Acute metabolic encephalopathy 11/94/1740  . Hyperammonemia ([place]) 11/09/2017  . UTI (urinary tract infection) 11/08/2017  . Diabetic hyperosmolar non-ketotic state ([place]) 11/07/2017  . Osteoarthritis 11/07/2017  . Tobacco abuse 11/07/2017  . Hepatic cirrhosis ([place]) 10/23/2017  . Nausea and vomiting in adult   . Hepatitis C virus infection without hepatic coma   . Splenomegaly   . Hypoalbuminemia   . Vomiting 05/01/2017  . Dehydration 05/01/2017  . Hyponatremia 05/01/2017  . Cocaine abuse ([place]) 05/01/2017  . Hyperglycemia 09/13/2016  . Polysubstance abuse ([place]) 09/13/2016  . Elevated LFTs 09/13/2016  . Protein-calorie malnutrition, severe ([place]) 09/13/2016  . Depression 09/13/2016  . Thrombocytopenia ([place]) 09/13/2016  . Diabetes mellitus with hyperglycemia ([place]) 05/13/2015  . Essential hypertension, benign 05/13/2015  . Smoker 05/13/2015  . Back pain at L4-L5 level 11/05/2013  . Neck pain 11/05/2013  . Cervical spondylosis 11/05/2013  . Spinal stenosis of lumbar region 11/05/2013   PCP:  Glenda Chroman, MD Pharmacy:   [facility] [place] [address] Phone: [phone] Fax: [phone]     Social Determinants of Health (SDOH) Interventions    Readmission Risk Interventions No flowsheet data found.

## 2018-11-07 NOTE — Progress Notes (Signed)
Pt CBG recheck 379. On call NP, Bodenheimer, made aware. Will continue to monitor and treat per MD orders.

## 2018-11-07 NOTE — Progress Notes (Signed)
PHARMACY CONSULT NOTE FOR:  OUTPATIENT  PARENTERAL ANTIBIOTIC THERAPY (OPAT)  Indication: Septic arthritis/ Osteomyelitis Regimen: Vancomycin 1000mg  IV q12h End date: 11/27/18  IV antibiotic discharge orders are pended. To discharging provider:  please sign these orders via discharge navigator,  Select New Orders & click on the button choice - Manage This Unsigned Work.     Rebekah Sprinkle A. [place]ada Dy, PharmD, [place] Please utilize Amion for appropriate phone number to reach the unit pharmacist ([place])   11/07/2018, 12:39 PM

## 2018-11-07 NOTE — Progress Notes (Addendum)
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   MRSA septic arthritis of the left elbow and osteomyelitis of the distal humerus and proximal radial head s/p I&D - POD #1 with appropriate post-surgical pain. No purulence encountered during surgery and no new cultures obtained. Will need prolonged course of therapy of at least 6 weeks for MRSA infection and will plan for 3 weeks of IV therapy followed by oral therapy for 3 weeks. Midline will need to be changed to PICC line prior to discharge. Plan of care discussed in detail with Mr. Mullin. Continue current dose of Vancomycin. OPAT orders placed.   Type 2 diabetes - Blood sugars remain labile overnight. Continue management per primary team.   Therapeutic Drug Monitoring - Renal function stable with creatine of 0.86. Pharmacy dosing AUC per protocol. Continue to monitor.   Hepatitis C - Will arrange for treatment outpatient.    Diagnosis: Septic arthritis of the left elbow and osteomyelitis of the humerus and radial head  Culture Result: MRSA  No Known Allergies  OPAT Orders Discharge antibiotics: Vancomycin  Per pharmacy protocol  Aim for Vancomycin trough 15-20 (unless otherwise indicated) Duration: 6 weeks (3 weeks IV / 3 weeks oral) End Date: 11/27/18 for IV and 12/18/18 for oral    [facility] Care Per Protocol:  Labs weekly while on IV antibiotics: _X_ CBC with differential _X_ BMP (biweekly) __ CMP _X_ CRP _X_ ESR _X_ Vancomycin trough __ CK  __ Please pull PIC at completion of IV antibiotics _X_ Please leave PIC in place until doctor has seen patient or been notified  Fax weekly labs to [phone]  Clinic Follow Up Appt: 11/25/18 at 4pm with Dr. Linus Salmons    Principal  Problem:   Infected elbow [facility]) Active Problems:   Diabetes mellitus with hyperglycemia ([place])   Thrombocytopenia ([place])   Tobacco abuse   Chronic hepatitis C without hepatic coma ([place])   . Chlorhexidine Gluconate Cloth  6 each Topical Q0600  . insulin aspart  0-15 Units Subcutaneous TID WC  . insulin aspart  0-5 Units Subcutaneous QHS  . insulin aspart  5 Units Subcutaneous TID WC  . insulin detemir  30 Units Subcutaneous Daily  . mupirocin ointment  1 application Nasal BID  . nicotine  21 mg Transdermal Daily  . sodium chloride flush  10-40 mL Intracatheter Q12H  . sodium chloride flush  3 mL Intravenous Q12H    SUBJECTIVE:  Afebrile overnight with no acute events. Continues to have right elbow pain following surgery yesterday.   No Known Allergies   Review of Systems: Review of Systems  Constitutional: Negative for chills, fever and weight loss.  Respiratory: Negative for cough, shortness of breath and wheezing.   Cardiovascular: Negative for chest pain and leg swelling.  Gastrointestinal: Negative for abdominal pain, constipation, diarrhea, nausea and vomiting.  Musculoskeletal:       Positive for left elbow pain  Skin: Negative for rash.    OBJECTIVE: Vitals:   11/06/18 1800 11/06/18 1818 11/06/18 2211 11/07/18 0620  BP:  (!) 178/86 (!) 152/80 (!) 149/81  Pulse:  91 89 74  Resp:  '19 16 16  '$ Temp: (!) 97.5 F (36.4 C) 98.2 F (36.8 C)  98 F (36.7 C) 98 F (36.7 C)  TempSrc:      SpO2:  99% 96% 99%  Weight:      Height:       Body mass index is 25.23 kg/m.  Physical Exam Constitutional:      General: He is not in acute distress.    Appearance: He is well-developed.     Comments: Seated in the chair; pleasant.   Cardiovascular:     Rate and Rhythm: Normal rate and regular rhythm.     Heart sounds: Normal heart sounds.     Comments: Midline located in right upper extremity; dressing is clean and dry; no evidence of infection.  Pulmonary:     Effort:  Pulmonary effort is normal.     Breath sounds: Normal breath sounds.  Musculoskeletal:     Comments: Post surgical dressing on left elbow is clean and dry.   Skin:    General: Skin is warm and dry.  Neurological:     Mental Status: He is alert and oriented to person, place, and time.  Psychiatric:        Mood and Affect: Mood normal.     Lab Results Lab Results  Component Value Date   WBC 5.7 11/07/2018   HGB 13.5 11/07/2018   HCT 37.1 (L) 11/07/2018   MCV 88.5 11/07/2018   PLT 147 (L) 11/07/2018    Lab Results  Component Value Date   CREATININE 0.86 11/07/2018   BUN 15 11/07/2018   NA 129 (L) 11/07/2018   K 4.5 11/07/2018   CL 99 11/07/2018   CO2 21 (L) 11/07/2018    Lab Results  Component Value Date   ALT 44 09/03/2018   AST 70 (H) 09/03/2018   ALKPHOS 97 09/03/2018   BILITOT 0.8 09/03/2018     Microbiology: Recent Results (from the past 240 hour(s))  Surgical pcr screen     Status: Abnormal   Collection Time: 11/05/18  4:30 PM  Result Value Ref Range Status   MRSA, PCR POSITIVE (A) NEGATIVE Final    Comment: RESULT CALLED TO, READ BACK BY AND VERIFIED WITHDaron Offer RN 1958 11/05/18 A BR[place]    Staphylococcus aureus POSITIVE (A) NEGATIVE Final    Comment: (NOTE) The Xpert SA Assay (FDA approved for NASAL specimens in patients 85 years of age and older), is one component of a comprehensive surveillance program. It is not intended to diagnose infection nor to guide or monitor treatment. Performed at [facility], [place] [address] [place], [place] [address]   Culture, blood (routine x 2)     Status: None (Preliminary result)   Collection Time: 11/05/18  5:10 PM  Result Value Ref Range Status   Specimen Description BLOOD RIGHT ANTECUBITAL  Final   Special Requests   Final    BOTTLES DRAWN AEROBIC ONLY Blood Culture adequate volume   Culture   Final    NO GROWTH 2 DAYS Performed at [facility], [place] [address], [place], [place] [address]     Report Status PENDING  Incomplete  Culture, blood (routine x 2)     Status: None (Preliminary result)   Collection Time: 11/05/18  5:15 PM  Result Value Ref Range Status   Specimen Description BLOOD RIGHT ANTECUBITAL  Final   Special Requests   Final    BOTTLES DRAWN AEROBIC ONLY Blood Culture adequate volume   Culture   Final    NO GROWTH 2 DAYS Performed at [facility], [place] [address] [place], [place] [address]    Report Status PENDING  Incomplete     Terri Piedra, [place] for [facility] [phone] Pager  11/07/2018  11:22 AM

## 2018-11-07 NOTE — Progress Notes (Signed)
Pts CBG rechecked. CBG 453. Pt continues to be asymptomatic. On call NP, Bodenheimer, made aware. One time dose of Novolog given. Will continue to monitor.

## 2018-11-07 NOTE — Progress Notes (Addendum)
Pt CBG 501. Pt asymptomatic. Placed order for STAT lab verification. On call NP, Bodenheimer, made aware. Levemir dose given per order. Pt has restricted R arm and refused to let lab draw from L arm due to pain and swelling. NP placed order for lab draw from foot. Lab glucose draw 472. Pt given 15 units of Novolog per orders. Will continue to monitor.

## 2018-11-07 NOTE — Discharge Instructions (Signed)
Goals for Diabetes Care- as Discussed with Diabetes Coordinator 1. Take medications as prescribed and place in area where you will remember everyday (such as kitchen). 2. Work towards checking blood sugars 2 times per day. May want to put kit by your insulin. 3. Call your doctor in [place] to set up appointment, so that they can help you with your diabetes. 4. Call the Dr in [place] if your blood sugars are consistently greater than 200 mg/dL on your meter.         Carbohydrate Counting for Diabetes Mellitus, Adult  Carbohydrate counting is a method of keeping track of how many carbohydrates you eat. Eating carbohydrates naturally increases the amount of sugar (glucose) in the blood. Counting how many carbohydrates you eat helps keep your blood glucose within normal limits, which helps you manage your diabetes (diabetes mellitus). It is important to know how many carbohydrates you can safely have in each meal. This is different for every person. A diet and nutrition specialist (registered dietitian) can help you make a meal plan and calculate how many carbohydrates you should have at each meal and snack. Carbohydrates are found in the following foods:  Grains, such as breads and cereals.  Dried beans and soy products.  Starchy vegetables, such as potatoes, peas, and corn.  Fruit and fruit juices.  Milk and yogurt.  Sweets and snack foods, such as cake, cookies, candy, chips, and soft drinks. How do I count carbohydrates? There are two ways to count carbohydrates in food. You can use either of the methods or a combination of both. Reading "Nutrition Facts" on packaged food The "Nutrition Facts" list is included on the labels of almost all packaged foods and beverages in the [place] It includes:  The serving size.  Information about nutrients in each serving, including the grams (g) of carbohydrate per serving. To use the Nutrition Facts":  Decide how many servings you will  have.  Multiply the number of servings by the number of carbohydrates per serving.  The resulting number is the total amount of carbohydrates that you will be having. Learning standard serving sizes of other foods When you eat carbohydrate foods that are not packaged or do not include "Nutrition Facts" on the label, you need to measure the servings in order to count the amount of carbohydrates:  Measure the foods that you will eat with a food scale or measuring cup, if needed.  Decide how many standard-size servings you will eat.  Multiply the number of servings by 15. Most carbohydrate-rich foods have about 15 g of carbohydrates per serving. ? For example, if you eat 8 oz (170 g) of strawberries, you will have eaten 2 servings and 30 g of carbohydrates (2 servings x 15 g = 30 g).  For foods that have more than one food mixed, such as soups and casseroles, you must count the carbohydrates in each food that is included. The following list contains standard serving sizes of common carbohydrate-rich foods. Each of these servings has about 15 g of carbohydrates:   hamburger bun or  English muffin.   oz (15 mL) syrup.   oz (14 g) jelly.  1 slice of bread.  1 six-inch tortilla.  3 oz (85 g) cooked rice or pasta.  4 oz (113 g) cooked dried beans.  4 oz (113 g) starchy vegetable, such as peas, corn, or potatoes.  4 oz (113 g) hot cereal.  4 oz (113 g) mashed potatoes or  of a large baked  potato.  4 oz (113 g) canned or frozen fruit.  4 oz (120 mL) fruit juice.  4-6 crackers.  6 chicken nuggets.  6 oz (170 g) unsweetened dry cereal.  6 oz (170 g) plain fat-free yogurt or yogurt sweetened with artificial sweeteners.  8 oz (240 mL) milk.  8 oz (170 g) fresh fruit or one small piece of fruit.  24 oz (680 g) popped popcorn. Example of carbohydrate counting Sample meal  3 oz (85 g) chicken breast.  6 oz (170 g) brown rice.  4 oz (113 g) corn.  8 oz (240 mL)  milk.  8 oz (170 g) strawberries with sugar-free whipped topping. Carbohydrate calculation 1. Identify the foods that contain carbohydrates: ? Rice. ? Corn. ? Milk. ? Strawberries. 2. Calculate how many servings you have of each food: ? 2 servings rice. ? 1 serving corn. ? 1 serving milk. ? 1 serving strawberries. 3. Multiply each number of servings by 15 g: ? 2 servings rice x 15 g = 30 g. ? 1 serving corn x 15 g = 15 g. ? 1 serving milk x 15 g = 15 g. ? 1 serving strawberries x 15 g = 15 g. 4. Add together all of the amounts to find the total grams of carbohydrates eaten: ? 30 g + 15 g + 15 g + 15 g = 75 g of carbohydrates total. Summary  Carbohydrate counting is a method of keeping track of how many carbohydrates you eat.  Eating carbohydrates naturally increases the amount of sugar (glucose) in the blood.  Counting how many carbohydrates you eat helps keep your blood glucose within normal limits, which helps you manage your diabetes.  A diet and nutrition specialist (registered dietitian) can help you make a meal plan and calculate how many carbohydrates you should have at each meal and snack. This information is not intended to replace advice given to you by your health care provider. Make sure you discuss any questions you have with your health care provider. Document Released: 06/18/2005 Document Revised: 12/26/2016 Document Reviewed: 11/30/2015 Elsevier Interactive Patient Education  2019 [employer]. Hypoglycemia Hypoglycemia occurs when the level of sugar (glucose) in the blood is too low. Hypoglycemia can happen in people who do or do not have diabetes. It can develop quickly, and it can be a medical emergency. For most people with diabetes, a blood glucose level below 70 mg/dL (3.9 mmol/L) is considered hypoglycemia. Glucose is a type of sugar that provides the body's main source of energy. Certain hormones (insulin and glucagon) control the level of glucose in the  blood. Insulin lowers blood glucose, and glucagon raises blood glucose. Hypoglycemia can result from having too much insulin in the bloodstream, or from not eating enough food that contains glucose. You may also have reactive hypoglycemia, which happens within 4 hours after eating a meal. What are the causes? Hypoglycemia occurs most often in people who have diabetes and may be caused by:  Diabetes medicine.  Not eating enough, or not eating often enough.  Increased physical activity.  Drinking alcohol on an empty stomach. If you do not have diabetes, hypoglycemia may be caused by:  A tumor in the pancreas.  Not eating enough, or not eating for long periods at a time (fasting).  A severe infection or illness.  Certain medicines. What increases the risk? Hypoglycemia is more likely to develop in:  People who have diabetes and take medicines to lower blood glucose.  People who abuse alcohol.  People who have a severe illness. What are the signs or symptoms? Mild symptoms Mild hypoglycemia may not cause any symptoms. If you do have symptoms, they may include:  Hunger.  Anxiety.  Sweating and feeling clammy.  Dizziness or feeling light-headed.  Sleepiness.  Nausea.  Increased heart rate.  Headache.  Blurry vision.  Irritability.  Tingling or numbness around the mouth, lips, or tongue.  A change in coordination.  Restless sleep. Moderate symptoms Moderate hypoglycemia can cause:  Mental confusion and poor judgment.  Behavior changes.  Weakness.  Irregular heartbeat. Severe symptoms Severe hypoglycemia is a medical emergency. It can cause:  Fainting.  Seizures.  Loss of consciousness (coma).  Death. How is this diagnosed? Hypoglycemia is diagnosed with a blood test to measure your blood glucose level. This blood test is done while you are having symptoms. Your health care provider may also do a physical exam and review your medical history. How  is this treated? This condition can often be treated by immediately eating or drinking something that contains sugar, such as:  Fruit juice, 4-6 oz (120-150 mL).  Regular soda (not diet soda), 4-6 oz (120-150 mL).  Low-fat milk, 4 oz (120 mL).  Several pieces of hard candy.  Sugar or honey, 1 Tbsp (15 mL). Treating hypoglycemia if you have diabetes If you are alert and able to swallow safely, follow the 15:15 rule:  Take 15 grams of a rapid-acting carbohydrate. Talk with your health care provider about how much you should take.  Rapid-acting options include: ? Glucose pills (take 15 grams). ? 6-8 pieces of hard candy. ? 4-6 oz (120-150 mL) of fruit juice. ? 4-6 oz (120-150 mL) of regular (not diet) soda. ? 1 Tbsp (15 mL) honey or sugar.  Check your blood glucose 15 minutes after you take the carbohydrate.  If the repeat blood glucose level is still at or below 70 mg/dL (3.9 mmol/L), take 15 grams of a carbohydrate again.  If your blood glucose level does not increase above 70 mg/dL (3.9 mmol/L) after 3 tries, seek emergency medical care.  After your blood glucose level returns to normal, eat a meal or a snack within 1 hour.  Treating severe hypoglycemia Severe hypoglycemia is when your blood glucose level is at or below 54 mg/dL (3 mmol/L). Severe hypoglycemia is a medical emergency. Get medical help right away. If you have severe hypoglycemia and you cannot eat or drink, you may need an injection of glucagon. A family member or close friend should learn how to check your blood glucose and how to give you a glucagon injection. Ask your health care provider if you need to have an emergency glucagon injection kit available. Severe hypoglycemia may need to be treated in a hospital. The treatment may include getting glucose through an IV. You may also need treatment for the cause of your hypoglycemia. Follow these instructions at home:  General instructions  Take over-the-counter  and prescription medicines only as told by your health care provider.  Monitor your blood glucose as told by your health care provider.  Limit alcohol intake to no more than 1 drink a day for nonpregnant women and 2 drinks a day for men. One drink equals 12 oz of beer (355 mL), 5 oz of wine (148 mL), or 1 oz of hard liquor (44 mL).  Keep all follow-up visits as told by your health care provider. This is important. If you have diabetes:  Always have a rapid-acting carbohydrate snack with you  to treat low blood glucose.  Follow your diabetes management plan as directed. Make sure you: ? Know the symptoms of hypoglycemia. It is important to treat it right away to prevent it from becoming severe. ? Take your medicines as directed. ? Follow your exercise plan. ? Follow your meal plan. Eat on time, and do not skip meals. ? Check your blood glucose as often as directed. Always check before and after exercise. ? Follow your sick day plan whenever you cannot eat or drink normally. Make this plan in advance with your health care provider.  Share your diabetes management plan with people in your workplace, school, and household.  Check your urine for ketones when you are ill and as told by your health care provider.  Carry a medical alert card or wear medical alert jewelry. Contact a health care provider if:  You have problems keeping your blood glucose in your target range.  You have frequent episodes of hypoglycemia. Get help right away if:  You continue to have hypoglycemia symptoms after eating or drinking something containing glucose.  Your blood glucose is at or below 54 mg/dL (3 mmol/L).  You have a seizure.  You faint. These symptoms may represent a serious problem that is an emergency. Do not wait to see if the symptoms will go away. Get medical help right away. Call your local emergency services (911 in the [place] Summary  Hypoglycemia occurs when the level of sugar (glucose)  in the blood is too low.  Hypoglycemia can happen in people who do or do not have diabetes. It can develop quickly, and it can be a medical emergency.  Make sure you know the symptoms of hypoglycemia and how to treat it.  Always have a rapid-acting carbohydrate snack with you to treat low blood sugar. This information is not intended to replace advice given to you by your health care provider. Make sure you discuss any questions you have with your health care provider. Document Released: 06/18/2005 Document Revised: 12/10/2017 Document Reviewed: 07/22/2015 Elsevier Interactive Patient Education  2019 [employer]. Hyperglycemia Hyperglycemia occurs when the level of sugar (glucose) in the blood is too high. Glucose is a type of sugar that provides the body's main source of energy. Certain hormones (insulin and glucagon) control the level of glucose in the blood. Insulin lowers blood glucose, and glucagon increases blood glucose. Hyperglycemia can result from having too little insulin in the bloodstream, or from the body not responding normally to insulin. Hyperglycemia occurs most often in people who have diabetes (diabetes mellitus), but it can happen in people who do not have diabetes. It can develop quickly, and it can be life-threatening if it causes you to become severely dehydrated (diabetic ketoacidosis or hyperglycemic hyperosmolar state). Severe hyperglycemia is a medical emergency. What are the causes? If you have diabetes, hyperglycemia may be caused by:  Diabetes medicine.  Medicines that increase blood glucose or affect your diabetes control.  Not eating enough, or not eating often enough.  Changes in physical activity level.  Being sick or having an infection. If you have prediabetes or undiagnosed diabetes:  Hyperglycemia may be caused by those conditions. If you do not have diabetes, hyperglycemia may be caused by:  Certain medicines, including steroid medicines,  beta-blockers, epinephrine, and thiazide diuretics.  Stress.  Serious illness.  Surgery.  Diseases of the pancreas.  Infection. What increases the risk? Hyperglycemia is more likely to develop in people who have risk factors for diabetes, such as:  Having a family member with diabetes.  Having a gene for type 1 diabetes that is passed from parent to child (inherited).  Living in an area with cold weather conditions.  Exposure to certain viruses.  Certain conditions in which the body's disease-fighting (immune) system attacks itself (autoimmune disorders).  Being overweight or obese.  Having an inactive (sedentary) lifestyle.  Having been diagnosed with insulin resistance.  Having a history of prediabetes, gestational diabetes, or polycystic ovarian syndrome (PCOS).  Being of American-Indian, African-American, Hispanic/Latino, or Asian/Pacific Islander descent. What are the signs or symptoms? Hyperglycemia may not cause any symptoms. If you do have symptoms, they may include early warning signs, such as:  Increased thirst.  Hunger.  Feeling very tired.  Needing to urinate more often than usual.  Blurry vision. Other symptoms may develop if hyperglycemia gets worse, such as:  Dry mouth.  Loss of appetite.  Fruity-smelling breath.  Weakness.  Unexpected or rapid weight gain or weight loss.  Tingling or numbness in the hands or feet.  Headache.  Skin that does not quickly return to normal after being lightly pinched and released (poor skin turgor).  Abdominal pain.  Cuts or bruises that are slow to heal. How is this diagnosed? Hyperglycemia is diagnosed with a blood test to measure your blood glucose level. This blood test is usually done while you are having symptoms. Your health care provider may also do a physical exam and review your medical history. You may have more tests to determine the cause of your hyperglycemia, such as:  A fasting blood  glucose (FBG) test. You will not be allowed to eat (you will fast) for at least 8 hours before a blood sample is taken.  An A1c (hemoglobin A1c) blood test. This provides information about blood glucose control over the previous 2-3 months.  An oral glucose tolerance test (OGTT). This measures your blood glucose at two times: ? After fasting. This is your baseline blood glucose level. ? Two hours after drinking a beverage that contains glucose. How is this treated? Treatment depends on the cause of your hyperglycemia. Treatment may include:  Taking medicine to regulate your blood glucose levels. If you take insulin or other diabetes medicines, your medicine or dosage may be adjusted.  Lifestyle changes, such as exercising more, eating healthier foods, or losing weight.  Treating an illness or infection, if this caused your hyperglycemia.  Checking your blood glucose more often.  Stopping or reducing steroid medicines, if these caused your hyperglycemia. If your hyperglycemia becomes severe and it results in hyperglycemic hyperosmolar state, you must be hospitalized and given IV fluids. Follow these instructions at home:  General instructions  Take over-the-counter and prescription medicines only as told by your health care provider.  Do not use any products that contain nicotine or tobacco, such as cigarettes and e-cigarettes. If you need help quitting, ask your health care provider.  Limit alcohol intake to no more than 1 drink per day for nonpregnant women and 2 drinks per day for men. One drink equals 12 oz of beer, 5 oz of wine, or 1 oz of hard liquor.  Learn to manage stress. If you need help with this, ask your health care provider.  Keep all follow-up visits as told by your health care provider. This is important. Eating and drinking   Maintain a healthy weight.  Exercise regularly, as directed by your health care provider.  Stay hydrated, especially when you exercise,  get sick, or spend time in  hot temperatures.  Eat healthy foods, such as: ? Lean proteins. ? Complex carbohydrates. ? Fresh fruits and vegetables. ? Low-fat dairy products. ? Healthy fats.  Drink enough fluid to keep your urine clear or pale yellow. If you have diabetes:  Make sure you know the symptoms of hyperglycemia.  Follow your diabetes management plan, as told by your health care provider. Make sure you: ? Take your insulin and medicines as directed. ? Follow your exercise plan. ? Follow your meal plan. Eat on time, and do not skip meals. ? Check your blood glucose as often as directed. Make sure to check your blood glucose before and after exercise. If you exercise longer or in a different way than usual, check your blood glucose more often. ? Follow your sick day plan whenever you cannot eat or drink normally. Make this plan in advance with your health care provider.  Share your diabetes management plan with people in your workplace, school, and household.  Check your urine for ketones when you are ill and as told by your health care provider.  Carry a medical alert card or wear medical alert jewelry. Contact a health care provider if:  Your blood glucose is at or above 240 mg/dL (13.3 mmol/L) for 2 days in a row.  You have problems keeping your blood glucose in your target range.  You have frequent episodes of hyperglycemia. Get help right away if:  You have difficulty breathing.  You have a change in how you think, feel, or act (mental status).  You have nausea or vomiting that does not go away. These symptoms may represent a serious problem that is an emergency. Do not wait to see if the symptoms will go away. Get medical help right away. Call your local emergency services (911 in the [place] Do not drive yourself to the hospital. Summary  Hyperglycemia occurs when the level of sugar (glucose) in the blood is too high.  Hyperglycemia is diagnosed with a blood  test to measure your blood glucose level. This blood test is usually done while you are having symptoms. Your health care provider may also do a physical exam and review your medical history.  If you have diabetes, follow your diabetes management plan as told by your health care provider.  Contact your health care provider if you have problems keeping your blood glucose in your target range. This information is not intended to replace advice given to you by your health care provider. Make sure you discuss any questions you have with your health care provider. Document Released: 12/12/2000 Document Revised: 03/05/2016 Document Reviewed: 03/05/2016 Elsevier Interactive Patient Education  2019 [employer].

## 2018-11-07 NOTE — Progress Notes (Signed)
Patient ID: Juan Juarez, male   DOB: 05/17/1956, 63 y.o.   MRN: [id number]  PROGRESS NOTE    Juan Juarez  MPN:[id number] DOB: 06-Feb-1956 DOA: 11/05/2018 PCP: Glenda Chroman, MD   Brief Narrative:  63 year old male with history of hypertension, hyperlipidemia, hepatitis C, diabetes mellitus type 2, inflammatory arthropathy, alcohol abuse, SDH status post craniotomy in 12/2017, recent I&D with biopsy of left forearm performed by Dr. Burney Gauze on 10/24/2018 with subsequent cultures positive for MRSA presented with increased left arm pain and drainage.  He had followed up with Dr. Paulino Door who spoke to Dr. Burney Gauze and recommended repeat I&D.  He was started on IV antibiotics.  Assessment & Plan:   Principal Problem:   Infected elbow ([place]) Active Problems:   Diabetes mellitus with hyperglycemia (HCC)   Thrombocytopenia (HCC)   Tobacco abuse   Chronic hepatitis C without hepatic coma (HCC)   Probable MRSA left elbow infection/osteomyelitis and likely septic arthritis -Patient had recent I&D with biopsy done on 10/24/2018: Pathology revealed chronic inflammatory tissue with no evidence of gout.  Cultures positive for MRSA and patient was started on Bactrim on 11/04/2018 but because of worsening pain and drainage, he was referred for IV antibiotics and need for I&D -Status post I&D by Dr. Burney Gauze on 11/06/2018.  Follow cultures. -Continue IV vancomycin.  ID following and recommending at least 2-3 weeks of IV antibiotics followed by prolonged oral therapy, depending on progress.   Diabetes mellitus type 2 uncontrolled with hyperglycemia -A1c was 11.5 on 09/02/2018.  Oral meds on hold.   -Increase Levemir to 30 units daily.  Add NovoLog with meals.  Continue sliding scale insulin.  Tobacco abuse -Continue nicotine patch.  Patient needs to stop smoking  History of hepatitis C and cirrhosis -Outpatient follow-up with PCP.  No evidence of ascites on physical exam  Chronic thrombocytopenia -No signs of  bleeding.  Monitor   DVT prophylaxis: SCDs Code Status: Full Family Communication: None at bedside Disposition Plan: Depends on clinical outcome  Consultants: Orthopedic/ID  Procedures: I&D of left elbow on 11/06/2018.  Antimicrobials: Vancomycin from 11/05/2018 onwards   Subjective: Patient seen and examined at bedside.  Denies any overnight fever, nausea or vomiting or worsening elbow pain. Objective: Vitals:   11/06/18 1800 11/06/18 1818 11/06/18 2211 11/07/18 0620  BP:  (!) 178/86 (!) 152/80 (!) 149/81  Pulse:  91 89 74  Resp:  19 16 16   Temp: (!) 97.5 F (36.4 C) 98.2 F (36.8 C) 98 F (36.7 C) 98 F (36.7 C)  TempSrc:      SpO2:  99% 96% 99%  Weight:      Height:        Intake/Output Summary (Last 24 hours) at 11/07/2018 0738 Last data filed at 11/07/2018 0306 Gross per 24 hour  Intake 1143.5 ml  Output 830 ml  Net 313.5 ml   Filed Weights   11/05/18 1651 11/06/18 1358  Weight: 73.1 kg 73.1 kg    Examination:  General exam: Appears calm and comfortable.  No distress Respiratory system: Bilateral decreased breath sounds at bases, no wheezing Cardiovascular system: S1 & S2 heard, Rate controlled Gastrointestinal system: Abdomen is nondistended, soft and nontender. Normal bowel sounds heard. Extremities: No cyanosis; left elbow dressing present   Data Reviewed: I have personally reviewed following labs and imaging studies  CBC: Recent Labs  Lab 11/05/18 1710 11/06/18 0300 11/07/18 0547  WBC 6.8 5.4 5.7  NEUTROABS  --   --  4.1  HGB 12.1* 12.1*  13.5  HCT 34.0* 33.9* 37.1*  MCV 90.7 89.7 88.5  PLT 134* 120* 220*   Basic Metabolic Panel: Recent Labs  Lab 11/05/18 1710 11/06/18 0300 11/06/18 2312 11/07/18 0547  NA 130* 132*  --  129*  K 3.8 4.2  --  4.5  CL 96* 101  --  99  CO2 23 24  --  21*  GLUCOSE 385* 266* 472* 263*  BUN 12 11  --  15  CREATININE 0.90 0.80  --  0.86  CALCIUM 8.3* 8.3*  --  8.9  MG  --   --   --  1.5*   GFR: Estimated  Creatinine Clearance: 81.1 mL/min (by C-G formula based on SCr of 0.86 mg/dL). Liver Function Tests: No results for input(s): AST, ALT, ALKPHOS, BILITOT, PROT, ALBUMIN in the last 168 hours. No results for input(s): LIPASE, AMYLASE in the last 168 hours. No results for input(s): AMMONIA in the last 168 hours. Coagulation Profile: Recent Labs  Lab 11/06/18 0300  INR 1.2   Cardiac Enzymes: No results for input(s): CKTOTAL, CKMB, CKMBINDEX, TROPONINI in the last 168 hours. BNP (last 3 results) No results for input(s): PROBNP in the last 8760 hours. HbA1C: No results for input(s): HGBA1C in the last 72 hours. CBG: Recent Labs  Lab 11/06/18 1220 11/06/18 1637 11/06/18 2206 11/07/18 0122 11/07/18 0243  GLUCAP 167* 152* 501* 453* 379*   Lipid Profile: No results for input(s): CHOL, HDL, LDLCALC, TRIG, CHOLHDL, LDLDIRECT in the last 72 hours. Thyroid Function Tests: No results for input(s): TSH, T4TOTAL, FREET4, T3FREE, THYROIDAB in the last 72 hours. Anemia Panel: No results for input(s): VITAMINB12, FOLATE, FERRITIN, TIBC, IRON, RETICCTPCT in the last 72 hours. Sepsis Labs: Recent Labs  Lab 11/05/18 1710  LATICACIDVEN 1.7    Recent Results (from the past 240 hour(s))  Surgical pcr screen     Status: Abnormal   Collection Time: 11/05/18  4:30 PM  Result Value Ref Range Status   MRSA, PCR POSITIVE (A) NEGATIVE Final    Comment: RESULT CALLED TO, READ BACK BY AND VERIFIED WITHDaron Offer RN [phone] 11/05/18 A BROWNING    Staphylococcus aureus POSITIVE (A) NEGATIVE Final    Comment: (NOTE) The Xpert SA Assay (FDA approved for NASAL specimens in patients 49 years of age and older), is one component of a comprehensive surveillance program. It is not intended to diagnose infection nor to guide or monitor treatment. Performed at [facility], [place] [address], [place], [place] [address]   Culture, blood (routine x 2)     Status: None (Preliminary result)   Collection Time:  11/05/18  5:10 PM  Result Value Ref Range Status   Specimen Description BLOOD RIGHT ANTECUBITAL  Final   Special Requests   Final    BOTTLES DRAWN AEROBIC ONLY Blood Culture adequate volume   Culture   Final    NO GROWTH 2 DAYS Performed at [facility], [place] [address] [place], [place] [address]    Report Status PENDING  Incomplete  Culture, blood (routine x 2)     Status: None (Preliminary result)   Collection Time: 11/05/18  5:15 PM  Result Value Ref Range Status   Specimen Description BLOOD RIGHT ANTECUBITAL  Final   Special Requests   Final    BOTTLES DRAWN AEROBIC ONLY Blood Culture adequate volume   Culture   Final    NO GROWTH 2 DAYS Performed at [facility], [place],  [place] [address]    Report Status PENDING  Incomplete         Radiology Studies: Dg Elbow Complete Left (3+view)  Result Date: 11/05/2018 CLINICAL DATA:  Elbow infection EXAM: LEFT ELBOW - COMPLETE 3+ VIEW COMPARISON:  09/27/2018 FINDINGS: In the interval, there has been erosion in bone destruction involving the radial head, portions of the lateral humeral condyle and proximal ulna likely related to septic arthritis and osteomyelitis. Elbow joint effusion. Diffuse soft tissue swelling. No dislocation. IMPRESSION: Bone destruction/erosion involving the distal lateral humeral condyle, proximal ulna and radial head compatible with osteomyelitis and likely septic arthritis. Large joint effusion and soft tissue swelling. Electronically Signed   By: Rolm Baptise M.D.   On: 11/05/2018 19:12        Scheduled Meds: . Chlorhexidine Gluconate Cloth  6 each Topical Q0600  . insulin aspart  0-15 Units Subcutaneous TID WC  . insulin aspart  0-5 Units Subcutaneous QHS  . insulin detemir  10 Units Subcutaneous QHS  . mupirocin ointment  1 application Nasal BID  . nicotine  21 mg Transdermal Daily  . sodium chloride flush  10-40 mL Intracatheter Q12H  . sodium chloride flush  3 mL  Intravenous Q12H   Continuous Infusions: . lactated ringers 10 mL/hr at 11/06/18 1719  . vancomycin 1,000 mg (11/07/18 0617)     LOS: 2 days        Aline August, MD [facility] 11/07/2018, 7:38 AM

## 2018-11-07 NOTE — Anesthesia Postprocedure Evaluation (Signed)
Anesthesia Post Note  Patient: Juan Juarez  Procedure(s) Performed: IRRIGATION AND DEBRIDEMENT LEFT ELBOW JOINT (Left )     Patient location during evaluation: PACU Anesthesia Type: General Level of consciousness: awake and alert Pain management: pain level controlled Vital Signs Assessment: post-procedure vital signs reviewed and stable Respiratory status: spontaneous breathing, nonlabored ventilation, respiratory function stable and patient connected to nasal cannula oxygen Cardiovascular status: blood pressure returned to baseline and stable Postop Assessment: no apparent nausea or vomiting Anesthetic complications: no    Last Vitals:  Vitals:   11/06/18 2211 11/07/18 0620  BP: (!) 152/80 (!) 149/81  Pulse: 89 74  Resp: 16 16  Temp: 36.7 C 36.7 C  SpO2: 96% 99%    Last Pain:  Vitals:   11/07/18 0940  TempSrc:   PainSc: 0-No pain                 Nimsi Males L Jencarlo Bonadonna

## 2018-11-08 LAB — CBC WITH DIFFERENTIAL/PLATELET
Abs Immature Granulocytes: 0.03 10*3/uL (ref 0.00–0.07)
Basophils Absolute: 0 10*3/uL (ref 0.0–0.1)
Basophils Relative: 0 %
Eosinophils Absolute: 0.1 10*3/uL (ref 0.0–0.5)
Eosinophils Relative: 1 %
HCT: 33.7 % — ABNORMAL LOW (ref 39.0–52.0)
Hemoglobin: 12 g/dL — ABNORMAL LOW (ref 13.0–17.0)
Immature Granulocytes: 0 %
Lymphocytes Relative: 30 %
Lymphs Abs: 2.2 10*3/uL (ref 0.7–4.0)
MCH: 32.3 pg (ref 26.0–34.0)
MCHC: 35.6 g/dL (ref 30.0–36.0)
MCV: 90.6 fL (ref 80.0–100.0)
Monocytes Absolute: 0.6 10*3/uL (ref 0.1–1.0)
Monocytes Relative: 9 %
Neutro Abs: 4.5 10*3/uL (ref 1.7–7.7)
Neutrophils Relative %: 60 %
Platelets: 128 10*3/uL — ABNORMAL LOW (ref 150–400)
RBC: 3.72 MIL/uL — ABNORMAL LOW (ref 4.22–5.81)
RDW: 14.7 % (ref 11.5–15.5)
WBC: 7.5 10*3/uL (ref 4.0–10.5)
nRBC: 0 % (ref 0.0–0.2)

## 2018-11-08 LAB — RAPID URINE DRUG SCREEN, HOSP PERFORMED
Amphetamines: NOT DETECTED
Barbiturates: NOT DETECTED
Benzodiazepines: POSITIVE — AB
Cocaine: NOT DETECTED
Opiates: POSITIVE — AB
Tetrahydrocannabinol: NOT DETECTED

## 2018-11-08 LAB — BASIC METABOLIC PANEL
Anion gap: 9 (ref 5–15)
BUN: 20 mg/dL (ref 8–23)
CO2: 22 mmol/L (ref 22–32)
Calcium: 8.4 mg/dL — ABNORMAL LOW (ref 8.9–10.3)
Chloride: 102 mmol/L (ref 98–111)
Creatinine, Ser: 0.73 mg/dL (ref 0.61–1.24)
GFR calc Af Amer: >60 mL/min (ref >60)
GFR calc non Af Amer: >60 mL/min (ref >60)
Glucose, Bld: 223 mg/dL — ABNORMAL HIGH (ref 70–99)
Potassium: 4.3 mmol/L (ref 3.5–5.1)
Sodium: 133 mmol/L — ABNORMAL LOW (ref 135–145)

## 2018-11-08 LAB — GLUCOSE, CAPILLARY
Glucose-Capillary: 282 mg/dL — ABNORMAL HIGH (ref 70–99)
Glucose-Capillary: 184 mg/dL — ABNORMAL HIGH (ref 70–99)

## 2018-11-08 LAB — C-REACTIVE PROTEIN: CRP: <0.8 mg/dL (ref <1.0)

## 2018-11-08 LAB — VANCOMYCIN, TROUGH: Vancomycin Tr: 14 ug/mL — ABNORMAL LOW (ref 15–20)

## 2018-11-08 LAB — VANCOMYCIN, PEAK: Vancomycin Pk: 31 ug/mL (ref 30–40)

## 2018-11-08 LAB — MAGNESIUM: Magnesium: 1.6 mg/dL — ABNORMAL LOW (ref 1.7–2.4)

## 2018-11-08 MED ORDER — VANCOMYCIN IV (FOR PTA / DISCHARGE USE ONLY): 1500.0000 mg | INTRAVENOUS | 0 refills | Status: AC

## 2018-11-08 MED ORDER — INSULIN DETEMIR 100 UNIT/ML ~~LOC~~ SOLN
35.0000 [IU] | Freq: Every day | SUBCUTANEOUS | Status: DC
  Administered 2018-11-08: 10:00:00 35 [IU] via SUBCUTANEOUS
  Filled 2018-11-08 (×2): qty 0.35

## 2018-11-08 MED ORDER — INSULIN ASPART 100 UNIT/ML ~~LOC~~ SOLN
7.0000 [IU] | Freq: Three times a day (TID) | SUBCUTANEOUS | Status: DC
  Administered 2018-11-08 – 2018-11-27 (×57): 7 [IU] via SUBCUTANEOUS

## 2018-11-08 MED ORDER — MAGNESIUM SULFATE 2 GM/50ML IV SOLN
2.0000 g | Freq: Once | INTRAVENOUS | Status: AC
  Administered 2018-11-08: 2 g via INTRAVENOUS
  Filled 2018-11-08: qty 50

## 2018-11-08 MED ORDER — SODIUM CHLORIDE 0.9% FLUSH
10.0000 mL | INTRAVENOUS | Status: DC | PRN
  Administered 2018-11-18 – 2018-11-21 (×2): 10 mL
  Filled 2018-11-08 (×2): qty 40

## 2018-11-08 MED ORDER — SODIUM CHLORIDE 0.9% FLUSH
10.0000 mL | Freq: Two times a day (BID) | INTRAVENOUS | Status: DC
  Administered 2018-11-08 – 2018-11-25 (×13): 10 mL

## 2018-11-08 NOTE — Progress Notes (Signed)
Pt instructed to keep LUE elevated to decrease edema. Pt not consistent, will continue to reinforce.

## 2018-11-08 NOTE — Progress Notes (Signed)
Spoke with Dr.s Moreen Fowler concerning PICC placement for home antibiotic administration. Dr. Starla Link via secure chat and Dr. Baxter Flattery via phone. Patient has a remote past of polysubstance abuse (admission 12/2017) Recommended that patient's with this history, if a PICC to be placed, that they stay in patient for close monitoring.  Patient has a midline for Vancomycin administration in patient. Plan is for three weeks of vancomycin, follow up, and possible longer with bactrim and rifampin at home. Per Dr. Starla Link, place PICC as patient needs vancomycin and will consider placement in a skilled facility. Per Dr. Baxter Flattery, considering the remote polysubstance history, place PICC and he will be closely monitored.

## 2018-11-08 NOTE — Progress Notes (Signed)
Pt refused CHG bath and VS this morning. Stated he is cold now, and he will do it later. Will notify day shift and reschedule CHG bath.

## 2018-11-08 NOTE — Progress Notes (Signed)
Peripherally Inserted Central Catheter/Midline Placement  The IV Nurse has discussed with the patient and/or persons authorized to consent for the patient, the purpose of this procedure and the potential benefits and risks involved with this procedure.  The benefits include less needle sticks, lab draws from the catheter, and the patient may be discharged home with the catheter. Risks include, but not limited to, infection, bleeding, blood clot (thrombus formation), and puncture of an artery; nerve damage and irregular heartbeat and possibility to perform a PICC exchange if needed/ordered by physician.  Alternatives to this procedure were also discussed.  Bard Power PICC patient education guide, fact sheet on infection prevention and patient information card has been provided to patient /or left at bedside.    PICC/Midline Placement Documentation  PICC Single Lumen 18/56/31 PICC Right Basilic 34 cm 1 cm (Active)  Indication for Insertion or Continuance of Line Prolonged intravenous therapies 11/08/2018  1:25 PM  Exposed Catheter (cm) 1 cm 11/08/2018  1:25 PM  Site Assessment Clean;Dry;Intact 11/08/2018  1:25 PM  Line Status Flushed;Saline locked;Blood return noted 11/08/2018  1:25 PM  Dressing Type Transparent 11/08/2018  1:25 PM  Dressing Status Clean;Dry;Intact;Antimicrobial disc in place 11/08/2018  1:25 PM  Line Care Connections checked and tightened 11/08/2018  1:25 PM  Line Adjustment (NICU/IV Team Only) No 11/08/2018  1:25 PM  Dressing Intervention New dressing 11/08/2018  1:25 PM  Dressing Change Due 11/15/18 11/08/2018  1:25 PM       Rolena Infante 11/08/2018, 1:26 PM

## 2018-11-08 NOTE — Progress Notes (Signed)
Pharmacy Antibiotic Note  Juan Juarez is a 63 y.o. male admitted on 11/05/2018 with left elbow infection being admitted from ID clinic.  Pharmacy has been consulted for vancomycin dosing. Recent admit and I&D that grew MRSA; sent out on Bactrim DS. Renal function from previous admit normal. Afebrile, no WBC.  Vancomycin levels today yield an AUC of 556  Plan: Decrease Vancomycin to 1500 mg iv Q 24 for predicted AUC of 416 Continue to follow   Height: 5\' 7"  (170.2 cm) Weight: 162 lb 11.2 oz (73.8 kg) IBW/kg (Calculated) : 66.1  Temp (24hrs), Avg:98.4 F (36.9 C), Min:98.1 F (36.7 C), Max:98.6 F (37 C)  Recent Labs  Lab 11/05/18 1710 11/06/18 0300 11/07/18 0547 11/08/18 0451 11/08/18 1525 11/08/18 2021  WBC 6.8 5.4 5.7 7.5  --   --   CREATININE 0.90 0.80 0.86 0.73  --   --   LATICACIDVEN 1.7  --   --   --   --   --   VANCOTROUGH  --   --   --   --  14*  --   VANCOPEAK  --   --   --   --   --  31    Estimated Creatinine Clearance: 87.2 mL/min (by C-G formula based on SCr of 0.73 mg/dL).    No Known Allergies  Antimicrobials this admission: vancomycin 5/6 >>   Dose adjustments this admission:   Microbiology results: 4/24 left elbow joint 2/2: MRSA    Thank you for allowing pharmacy to be a part of this patient's care. Anette Guarneri, PharmD  11/08/2018 10:02 PM  **Pharmacist phone directory can now be found on [email or web address] listed under [place]**

## 2018-11-08 NOTE — Progress Notes (Signed)
Patient ID: Juan Juarez, male   DOB: 1955/12/06, 63 y.o.   MRN: [id number]  PROGRESS NOTE    Juan Juarez  CZY:[id number] DOB: 14-Oct-1955 DOA: 11/05/2018 PCP: Glenda Chroman, MD   Brief Narrative:  63 year old male with history of hypertension, hyperlipidemia, hepatitis C, diabetes mellitus type 2, inflammatory arthropathy, alcohol abuse, SDH status post craniotomy in 12/2017, recent I&D with biopsy of left forearm performed by Dr. Burney Gauze on 10/24/2018 with subsequent cultures positive for MRSA presented with increased left arm pain and drainage.  He had followed up with Dr. Paulino Door who spoke to Dr. Burney Gauze and recommended repeat I&D.  He was started on IV antibiotics.  He underwent I&D on 11/06/2018.  Assessment & Plan:   Principal Problem:   Infected elbow ([place]) Active Problems:   Diabetes mellitus with hyperglycemia (HCC)   Thrombocytopenia (HCC)   Tobacco abuse   Chronic hepatitis C without hepatic coma (HCC)   Probable MRSA left elbow infection/osteomyelitis and likely septic arthritis -Patient had recent I&D with biopsy done on 10/24/2018: Pathology revealed chronic inflammatory tissue with no evidence of gout.  Cultures positive for MRSA and patient was started on Bactrim on 11/04/2018 but because of worsening pain and drainage, he was referred for IV antibiotics and need for I&D -Status post I&D by Dr. Burney Gauze on 11/06/2018.  -Continue IV vancomycin.  ID following and recommending at least 3 weeks of IV antibiotics followed by prolonged oral therapy, depending on progress. -For PICC placement today.  Because history of drug abuse in the past, he will not be able to be discharged home with a PICC line.  Will consult social worker for rehab placement.   Diabetes mellitus type 2 uncontrolled with hyperglycemia -A1c was 11.5 on 09/02/2018.  Oral meds on hold.   -Increase Levemir to 35 units daily.  Increase NovoLog to 7 units 3 times a day with meals.  Continue sliding scale insulin.   Tobacco abuse -Continue nicotine patch.  Patient needs to stop smoking  History of hepatitis C and cirrhosis -Outpatient follow-up with PCP.  No evidence of ascites on physical exam  Chronic thrombocytopenia -No signs of bleeding.  Monitor  History of drug abuse -Patient states that he has not used cocaine for the last few months.  Will get urine drug screen.  Hypomagnesemia Replace.  Repeat a.m. labs   DVT prophylaxis: SCDs Code Status: Full Family Communication: None at bedside Disposition Plan: SNF once bed is available  Consultants: Orthopedic/ID  Procedures: I&D of left elbow on 11/06/2018.  Antimicrobials: Vancomycin from 11/05/2018 onwards   Subjective: Patient seen and examined at bedside.  No overnight fever, nausea, vomiting or worsening elbow pain.   Objective: Vitals:   11/06/18 2211 11/07/18 0620 11/07/18 2254 11/08/18 0500  BP: (!) 152/80 (!) 149/81 130/73   Pulse: 89 74 69   Resp: 16 16    Temp: 98 F (36.7 C) 98 F (36.7 C) 98.1 F (36.7 C)   TempSrc:   Oral   SpO2: 96% 99% 99%   Weight:    73.8 kg  Height:        Intake/Output Summary (Last 24 hours) at 11/08/2018 1142 Last data filed at 11/07/2018 1300 Gross per 24 hour  Intake 240 ml  Output -  Net 240 ml   Filed Weights   11/05/18 1651 11/06/18 1358 11/08/18 0500  Weight: 73.1 kg 73.1 kg 73.8 kg    Examination:  General exam: Appears calm and comfortable.  No acute distress Respiratory system: Bilateral  decreased breath sounds at bases Cardiovascular system: Rate controlled, S1-S2 heard Gastrointestinal system: Abdomen is nondistended, soft and nontender. Normal bowel sounds heard. Extremities: No cyanosis; left elbow dressing present   Data Reviewed: I have personally reviewed following labs and imaging studies  CBC: Recent Labs  Lab 11/05/18 1710 11/06/18 0300 11/07/18 0547 11/08/18 0451  WBC 6.8 5.4 5.7 7.5  NEUTROABS  --   --  4.1 4.5  HGB 12.1* 12.1* 13.5 12.0*  HCT  34.0* 33.9* 37.1* 33.7*  MCV 90.7 89.7 88.5 90.6  PLT 134* 120* 147* 811*   Basic Metabolic Panel: Recent Labs  Lab 11/05/18 1710 11/06/18 0300 11/06/18 2312 11/07/18 0547 11/08/18 0451  NA 130* 132*  --  129* 133*  K 3.8 4.2  --  4.5 4.3  CL 96* 101  --  99 102  CO2 23 24  --  21* 22  GLUCOSE 385* 266* 472* 263* 223*  BUN 12 11  --  15 20  CREATININE 0.90 0.80  --  0.86 0.73  CALCIUM 8.3* 8.3*  --  8.9 8.4*  MG  --   --   --  1.5* 1.6*   GFR: Estimated Creatinine Clearance: 87.2 mL/min (by C-G formula based on SCr of 0.73 mg/dL). Liver Function Tests: No results for input(s): AST, ALT, ALKPHOS, BILITOT, PROT, ALBUMIN in the last 168 hours. No results for input(s): LIPASE, AMYLASE in the last 168 hours. No results for input(s): AMMONIA in the last 168 hours. Coagulation Profile: Recent Labs  Lab 11/06/18 0300  INR 1.2   Cardiac Enzymes: No results for input(s): CKTOTAL, CKMB, CKMBINDEX, TROPONINI in the last 168 hours. BNP (last 3 results) No results for input(s): PROBNP in the last 8760 hours. HbA1C: No results for input(s): HGBA1C in the last 72 hours. CBG: Recent Labs  Lab 11/07/18 0756 11/07/18 1209 11/07/18 1734 11/07/18 2250 11/08/18 1003  GLUCAP 350* 243* 257* 342* 282*   Lipid Profile: No results for input(s): CHOL, HDL, LDLCALC, TRIG, CHOLHDL, LDLDIRECT in the last 72 hours. Thyroid Function Tests: No results for input(s): TSH, T4TOTAL, FREET4, T3FREE, THYROIDAB in the last 72 hours. Anemia Panel: No results for input(s): VITAMINB12, FOLATE, FERRITIN, TIBC, IRON, RETICCTPCT in the last 72 hours. Sepsis Labs: Recent Labs  Lab 11/05/18 1710  LATICACIDVEN 1.7    Recent Results (from the past 240 hour(s))  Surgical pcr screen     Status: Abnormal   Collection Time: 11/05/18  4:30 PM  Result Value Ref Range Status   MRSA, PCR POSITIVE (A) NEGATIVE Final    Comment: RESULT CALLED TO, READ BACK BY AND VERIFIED WITHDaron Offer RN [phone] 11/05/18 A  BROWNING    Staphylococcus aureus POSITIVE (A) NEGATIVE Final    Comment: (NOTE) The Xpert SA Assay (FDA approved for NASAL specimens in patients 40 years of age and older), is one component of a comprehensive surveillance program. It is not intended to diagnose infection nor to guide or monitor treatment. Performed at [facility], [place] [address] [place], [place] [address]   Culture, blood (routine x 2)     Status: None (Preliminary result)   Collection Time: 11/05/18  5:10 PM  Result Value Ref Range Status   Specimen Description BLOOD RIGHT ANTECUBITAL  Final   Special Requests   Final    BOTTLES DRAWN AEROBIC ONLY Blood Culture adequate volume   Culture   Final    NO GROWTH 2 DAYS Performed at [facility], [place]  [address], [place], [place] [address]    Report Status PENDING  Incomplete  Culture, blood (routine x 2)     Status: None (Preliminary result)   Collection Time: 11/05/18  5:15 PM  Result Value Ref Range Status   Specimen Description BLOOD RIGHT ANTECUBITAL  Final   Special Requests   Final    BOTTLES DRAWN AEROBIC ONLY Blood Culture adequate volume   Culture   Final    NO GROWTH 2 DAYS Performed at [facility], [place] [address] [place], [place] [address]    Report Status PENDING  Incomplete         Radiology Studies: [place] Ekg Site Rite  Result Date: 11/07/2018 If Site Rite image not attached, placement could not be confirmed due to current cardiac rhythm.       Scheduled Meds: . Chlorhexidine Gluconate Cloth  6 each Topical Q0600  . insulin aspart  0-15 Units Subcutaneous TID WC  . insulin aspart  0-5 Units Subcutaneous QHS  . insulin aspart  7 Units Subcutaneous TID WC  . insulin detemir  35 Units Subcutaneous Daily  . mupirocin ointment  1 application Nasal BID  . nicotine  21 mg Transdermal Daily  . sodium chloride flush  10-40 mL Intracatheter Q12H  . sodium chloride flush  3 mL Intravenous Q12H   Continuous Infusions: .  lactated ringers 10 mL/hr at 11/06/18 1719  . vancomycin 1,000 mg (11/08/18 0539)     LOS: 3 days        Aline August, MD [facility] 11/08/2018, 11:42 AM

## 2018-11-08 NOTE — Progress Notes (Signed)
PHARMACY CONSULT NOTE FOR:  OUTPATIENT  PARENTERAL ANTIBIOTIC THERAPY (OPAT)  Indication: Septic arthritis/ Osteomyelitis Regimen: Vancomycin 1500 mg iv Q 24 hours End date: 11/27/18  IV antibiotic discharge orders are pended. To discharging provider:  please sign these orders via discharge navigator,  Select New Orders & click on the button choice - Manage This Unsigned Work.    Thank you Anette Guarneri, PharmD  Please utilize [facility]ion for appropriate phone number to reach the unit pharmacist ([place])   11/08/2018, 10:03 PM

## 2018-11-08 NOTE — Progress Notes (Signed)
At bedside, pt eating lunch.  Requests for PICC team to return in 30 minutes to finish lunch.

## 2018-11-09 LAB — GLUCOSE, CAPILLARY
Glucose-Capillary: 244 mg/dL — ABNORMAL HIGH (ref 70–99)
Glucose-Capillary: 150 mg/dL — ABNORMAL HIGH (ref 70–99)
Glucose-Capillary: 128 mg/dL — ABNORMAL HIGH (ref 70–99)
Glucose-Capillary: 277 mg/dL — ABNORMAL HIGH (ref 70–99)

## 2018-11-09 MED ORDER — INSULIN DETEMIR 100 UNIT/ML ~~LOC~~ SOLN
40.0000 [IU] | Freq: Every day | SUBCUTANEOUS | Status: DC
  Administered 2018-11-09 – 2018-11-10 (×2): 40 [IU] via SUBCUTANEOUS
  Filled 2018-11-09 (×2): qty 0.4

## 2018-11-09 MED ORDER — ENOXAPARIN SODIUM 40 MG/0.4ML ~~LOC~~ SOLN
40.0000 mg | SUBCUTANEOUS | Status: DC
  Administered 2018-11-09 – 2018-11-22 (×4): 40 mg via SUBCUTANEOUS
  Filled 2018-11-09 (×16): qty 0.4

## 2018-11-09 MED ORDER — MAGNESIUM SULFATE 2 GM/50ML IV SOLN
2.0000 g | Freq: Once | INTRAVENOUS | Status: AC
  Administered 2018-11-09: 2 g via INTRAVENOUS
  Filled 2018-11-09: qty 50

## 2018-11-09 NOTE — Progress Notes (Signed)
Pt refused bath and CHG bath this morning. RN explained importance of bath and CHG bath related to CLABSI. Pt agreed to do it later, once his pain gets under control. Day shift notified.

## 2018-11-09 NOTE — Progress Notes (Signed)
Patient ID: Juan Juarez, male   DOB: 1955/08/30, 63 y.o.   MRN: [id number]  PROGRESS NOTE    Kenzo Ozment  JGO:[id number] DOB: 1956/05/11 DOA: 11/05/2018 PCP: Glenda Chroman, MD   Brief Narrative:  63 year old male with history of hypertension, hyperlipidemia, hepatitis C, diabetes mellitus type 2, inflammatory arthropathy, alcohol abuse, SDH status post craniotomy in 12/2017, recent I&D with biopsy of left forearm performed by Dr. Burney Gauze on 10/24/2018 with subsequent cultures positive for MRSA presented with increased left arm pain and drainage.  He had followed up with Dr. Paulino Door who spoke to Dr. Burney Gauze and recommended repeat I&D.  He was started on IV antibiotics.  He underwent I&D on 11/06/2018.  Assessment & Plan:   Principal Problem:   Infected elbow ([place]) Active Problems:   Diabetes mellitus with hyperglycemia (HCC)   Thrombocytopenia (HCC)   Tobacco abuse   Chronic hepatitis C without hepatic coma (HCC)   Probable MRSA left elbow infection/osteomyelitis and likely septic arthritis -Patient had recent I&D with biopsy done on 10/24/2018: Pathology revealed chronic inflammatory tissue with no evidence of gout.  Cultures positive for MRSA and patient was started on Bactrim on 11/04/2018 but because of worsening pain and drainage, he was referred for IV antibiotics and need for I&D -Status post I&D by Dr. Burney Gauze on 11/06/2018.  -Continue IV vancomycin.  ID following and recommending at least 3 weeks of IV antibiotics followed by prolonged oral therapy, depending on progress. -Status post PICC placement on 11/08/2018.  Because of history of drug abuse in the past, he will not be able to be discharged home with a PICC line.  Social worker consulted for rehab placement.  Diabetes mellitus type 2 uncontrolled with hyperglycemia -A1c was 11.5 on 09/02/2018.  Oral meds on hold.   -Blood sugar still elevated.  Increase Levemir to 40 units daily.  Continue NovoLog.  Continue sliding scale insulin.   Tobacco abuse -Continue nicotine patch.  Patient needs to stop smoking  History of hepatitis C and cirrhosis -Outpatient follow-up with PCP.  No evidence of ascites on physical exam  Chronic thrombocytopenia -No signs of bleeding.  Monitor  History of drug abuse -Patient states that he has not used cocaine for the last few months.  -Drug screen positive for opiates and benzos.  Hypomagnesemia -Replace.    DVT prophylaxis: SCDs.  Will start Lovenox Code Status: Full Family Communication: None at bedside Disposition Plan: SNF once bed is available  Consultants: Orthopedic/ID  Procedures: I&D of left elbow on 11/06/2018.  Antimicrobials: Vancomycin from 11/05/2018 onwards   Subjective: Patient seen and examined at bedside.  Denies worsening elbow pain.  No overnight fever or vomiting.   Objective: Vitals:   11/08/18 2215 11/09/18 0554 11/09/18 0556 11/09/18 0700  BP: 136/83  (!) 163/132 (!) 150/94  Pulse: 66  74   Resp: 18  18   Temp: 98.6 F (37 C)  98 F (36.7 C)   TempSrc: Oral  Oral   SpO2: 97%  98%   Weight:  72.6 kg    Height:       No intake or output data in the 24 hours ending 11/09/18 0736 Filed Weights   11/06/18 1358 11/08/18 0500 11/09/18 0554  Weight: 73.1 kg 73.8 kg 72.6 kg    Examination:  General exam: No acute distress.  Looks older than stated age. Respiratory system: Bilateral decreased breath sounds at bases, no wheezing Cardiovascular system: S1-S2 heard, rate controlled Gastrointestinal system: Abdomen is nondistended, soft and nontender.  Normal bowel sounds heard. Extremities: No cyanosis; left elbow dressing present   Data Reviewed: I have personally reviewed following labs and imaging studies  CBC: Recent Labs  Lab 11/05/18 1710 11/06/18 0300 11/07/18 0547 11/08/18 0451  WBC 6.8 5.4 5.7 7.5  NEUTROABS  --   --  4.1 4.5  HGB 12.1* 12.1* 13.5 12.0*  HCT 34.0* 33.9* 37.1* 33.7*  MCV 90.7 89.7 88.5 90.6  PLT 134* 120* 147* 128*    Basic Metabolic Panel: Recent Labs  Lab 11/05/18 1710 11/06/18 0300 11/06/18 2312 11/07/18 0547 11/08/18 0451  NA 130* 132*  --  129* 133*  K 3.8 4.2  --  4.5 4.3  CL 96* 101  --  99 102  CO2 23 24  --  21* 22  GLUCOSE 385* 266* 472* 263* 223*  BUN 12 11  --  15 20  CREATININE 0.90 0.80  --  0.86 0.73  CALCIUM 8.3* 8.3*  --  8.9 8.4*  MG  --   --   --  1.5* 1.6*   GFR: Estimated Creatinine Clearance: 87.2 mL/min (by C-G formula based on SCr of 0.73 mg/dL). Liver Function Tests: No results for input(s): AST, ALT, ALKPHOS, BILITOT, PROT, ALBUMIN in the last 168 hours. No results for input(s): LIPASE, AMYLASE in the last 168 hours. No results for input(s): AMMONIA in the last 168 hours. Coagulation Profile: Recent Labs  Lab 11/06/18 0300  INR 1.2   Cardiac Enzymes: No results for input(s): CKTOTAL, CKMB, CKMBINDEX, TROPONINI in the last 168 hours. BNP (last 3 results) No results for input(s): PROBNP in the last 8760 hours. HbA1C: No results for input(s): HGBA1C in the last 72 hours. CBG: Recent Labs  Lab 11/07/18 1209 11/07/18 1734 11/07/18 2250 11/08/18 1003 11/08/18 1156  GLUCAP 243* 257* 342* 282* 184*   Lipid Profile: No results for input(s): CHOL, HDL, LDLCALC, TRIG, CHOLHDL, LDLDIRECT in the last 72 hours. Thyroid Function Tests: No results for input(s): TSH, T4TOTAL, FREET4, T3FREE, THYROIDAB in the last 72 hours. Anemia Panel: No results for input(s): VITAMINB12, FOLATE, FERRITIN, TIBC, IRON, RETICCTPCT in the last 72 hours. Sepsis Labs: Recent Labs  Lab 11/05/18 1710  LATICACIDVEN 1.7    Recent Results (from the past 240 hour(s))  Surgical pcr screen     Status: Abnormal   Collection Time: 11/05/18  4:30 PM  Result Value Ref Range Status   MRSA, PCR POSITIVE (A) NEGATIVE Final    Comment: RESULT CALLED TO, READ BACK BY AND VERIFIED WITHDaron Offer RN [phone] 11/05/18 A BROWNING    Staphylococcus aureus POSITIVE (A) NEGATIVE Final    Comment:  (NOTE) The Xpert SA Assay (FDA approved for NASAL specimens in patients 40 years of age and older), is one component of a comprehensive surveillance program. It is not intended to diagnose infection nor to guide or monitor treatment. Performed at [facility], [place] [address] [place], [place] [address]   Culture, blood (routine x 2)     Status: None (Preliminary result)   Collection Time: 11/05/18  5:10 PM  Result Value Ref Range Status   Specimen Description BLOOD RIGHT ANTECUBITAL  Final   Special Requests   Final    BOTTLES DRAWN AEROBIC ONLY Blood Culture adequate volume   Culture   Final    NO GROWTH 2 DAYS Performed at [facility], [place] [address] [place], [place] [address]    Report Status PENDING  Incomplete  Culture, blood (routine x 2)  Status: None (Preliminary result)   Collection Time: 11/05/18  5:15 PM  Result Value Ref Range Status   Specimen Description BLOOD RIGHT ANTECUBITAL  Final   Special Requests   Final    BOTTLES DRAWN AEROBIC ONLY Blood Culture adequate volume   Culture   Final    NO GROWTH 2 DAYS Performed at [facility], [address] [place], [place] [address]    Report Status PENDING  Incomplete         Radiology Studies: [place] Ekg Site Rite  Result Date: 11/07/2018 If Site Rite image not attached, placement could not be confirmed due to current cardiac rhythm.       Scheduled Meds: . Chlorhexidine Gluconate Cloth  6 each Topical Q0600  . insulin aspart  0-15 Units Subcutaneous TID WC  . insulin aspart  0-5 Units Subcutaneous QHS  . insulin aspart  7 Units Subcutaneous TID WC  . insulin detemir  35 Units Subcutaneous Daily  . mupirocin ointment  1 application Nasal BID  . nicotine  21 mg Transdermal Daily  . sodium chloride flush  10-40 mL Intracatheter Q12H  . sodium chloride flush  10-40 mL Intracatheter Q12H  . sodium chloride flush  3 mL Intravenous Q12H   Continuous Infusions: . lactated ringers 10 mL/hr  at 11/06/18 1719  . vancomycin 1,000 mg (11/09/18 0618)     LOS: 4 days        Aline August, MD [facility] 11/09/2018, 7:36 AM

## 2018-11-10 LAB — GLUCOSE, CAPILLARY
Glucose-Capillary: 231 mg/dL — ABNORMAL HIGH (ref 70–99)
Glucose-Capillary: 288 mg/dL — ABNORMAL HIGH (ref 70–99)
Glucose-Capillary: 148 mg/dL — ABNORMAL HIGH (ref 70–99)
Glucose-Capillary: 148 mg/dL — ABNORMAL HIGH (ref 70–99)
Glucose-Capillary: 260 mg/dL — ABNORMAL HIGH (ref 70–99)
Glucose-Capillary: 305 mg/dL — ABNORMAL HIGH (ref 70–99)
Glucose-Capillary: 125 mg/dL — ABNORMAL HIGH (ref 70–99)
Glucose-Capillary: 104 mg/dL — ABNORMAL HIGH (ref 70–99)
Glucose-Capillary: 185 mg/dL — ABNORMAL HIGH (ref 70–99)

## 2018-11-10 LAB — CULTURE, BLOOD (ROUTINE X 2)
Culture: NO GROWTH
Culture: NO GROWTH
Special Requests: ADEQUATE
Special Requests: ADEQUATE

## 2018-11-10 MED ORDER — BISACODYL 10 MG RE SUPP: 10.0000 mg | Freq: Every day | RECTAL | Status: DC | PRN

## 2018-11-10 MED ORDER — VANCOMYCIN HCL 10 G IV SOLR
1500.0000 mg | INTRAVENOUS | Status: DC
  Administered 2018-11-11 – 2018-11-14 (×4): 1500 mg via INTRAVENOUS
  Filled 2018-11-10 (×4): qty 1500

## 2018-11-10 MED ORDER — INSULIN DETEMIR 100 UNIT/ML ~~LOC~~ SOLN
45.0000 [IU] | Freq: Every day | SUBCUTANEOUS | Status: DC
  Administered 2018-11-11 – 2018-11-21 (×11): 45 [IU] via SUBCUTANEOUS
  Filled 2018-11-10 (×14): qty 0.45

## 2018-11-10 MED ORDER — OXYCODONE-ACETAMINOPHEN 5-325 MG PO TABS
1.0000 | ORAL_TABLET | ORAL | Status: DC | PRN
  Administered 2018-11-10 – 2018-11-12 (×7): 2 via ORAL
  Administered 2018-11-12: 1 via ORAL
  Administered 2018-11-12 – 2018-11-15 (×7): 2 via ORAL
  Administered 2018-11-15: 1 via ORAL
  Administered 2018-11-16 – 2018-11-25 (×31): 2 via ORAL
  Administered 2018-11-25: 1 via ORAL
  Administered 2018-11-26 – 2018-11-27 (×3): 2 via ORAL
  Filled 2018-11-10 (×49): qty 2
  Filled 2018-11-10: qty 1
  Filled 2018-11-10 (×2): qty 2

## 2018-11-10 MED ORDER — SENNOSIDES-DOCUSATE SODIUM 8.6-50 MG PO TABS
1.0000 | ORAL_TABLET | Freq: Two times a day (BID) | ORAL | Status: DC
  Administered 2018-11-10 – 2018-11-27 (×33): 1 via ORAL
  Filled 2018-11-10 (×35): qty 1

## 2018-11-10 MED ORDER — POLYETHYLENE GLYCOL 3350 17 G PO PACK
17.0000 g | PACK | Freq: Every day | ORAL | Status: DC | PRN
  Administered 2018-11-13: 18:00:00 17 g via ORAL
  Filled 2018-11-10: qty 1

## 2018-11-10 NOTE — Progress Notes (Signed)
Patient ID: Abdulkareem Badolato, male   DOB: [birth year], 63 y.o.   MRN: [id number]  PROGRESS NOTE    Kongmeng Santoro  IRS:[id number] DOB: Jul 04, 1955 DOA: 11/05/2018 PCP: Glenda Chroman, MD   Brief Narrative:  63 year old male with history of hypertension, hyperlipidemia, hepatitis C, diabetes mellitus type 2, inflammatory arthropathy, alcohol abuse, SDH status post craniotomy in 12/2017, recent I&D with biopsy of left forearm performed by Dr. Burney Gauze on 10/24/2018 with subsequent cultures positive for MRSA presented with increased left arm pain and drainage.  He had followed up with Dr. Paulino Door who spoke to Dr. Burney Gauze and recommended repeat I&D.  He was started on IV antibiotics.  He underwent I&D on 11/06/2018.  Assessment & Plan:   Principal Problem:   Infected elbow ([place]) Active Problems:   Diabetes mellitus with hyperglycemia (HCC)   Thrombocytopenia (HCC)   Tobacco abuse   Chronic hepatitis C without hepatic coma (HCC)   Probable MRSA left elbow infection/osteomyelitis and likely septic arthritis -Patient had recent I&D with biopsy done on 10/24/2018: Pathology revealed chronic inflammatory tissue with no evidence of gout.  Cultures positive for MRSA and patient was started on Bactrim on 11/04/2018 but because of worsening pain and drainage, he was referred for IV antibiotics and need for I&D -Status post I&D by Dr. Burney Gauze on 11/06/2018.  -Continue IV vancomycin.  ID following and recommending at least 3 weeks of IV antibiotics followed by prolonged oral therapy, depending on progress. -Status post PICC placement on 11/08/2018.  Because of history of drug abuse in the past, he will not be able to be discharged home with a PICC line.  Social worker consulted for rehab placement. -Continue IV vancomycin till 11/27/2018.  Monitor labs intermittently.  Diabetes mellitus type 2 uncontrolled with hyperglycemia -A1c was 11.5 on 09/02/2018.  Oral meds on hold.   -Blood sugars improving.  Continue Levemir and  NovoLog along with sliding scale insulin.  Tobacco abuse -Continue nicotine patch.  Patient needs to stop smoking  History of hepatitis C and cirrhosis -Outpatient follow-up with PCP.  No evidence of ascites on physical exam  Chronic thrombocytopenia -No signs of bleeding.  Monitor  History of drug abuse -Patient states that he has not used cocaine for the last few months.  -Drug screen positive for opiates and benzos.  Hypomagnesemia -No labs today.  Monitor  DVT prophylaxis:  Lovenox Code Status: Full Family Communication: None at bedside Disposition Plan: SNF once bed is available  Consultants: Orthopedic/ID  Procedures: I&D of left elbow on 11/06/2018.  Antimicrobials: Vancomycin from 11/05/2018 onwards   Subjective: Patient seen and examined at bedside.  No overnight fever, nausea, vomiting or increasing elbow pain.   Objective: Vitals:   11/09/18 0700 11/09/18 1352 11/09/18 2310 11/10/18 0646  BP: (!) 150/94 (!) 167/108 (!) 153/81 (!) 149/78  Pulse:  74 70 72  Resp:  16 17 18   Temp:  98.5 F (36.9 C) 98.5 F (36.9 C) 98.4 F (36.9 C)  TempSrc:  Oral Oral Oral  SpO2:  100% 98% 99%  Weight:    72.5 kg  Height:       No intake or output data in the 24 hours ending 11/10/18 0740 Filed Weights   11/08/18 0500 11/09/18 0554 11/10/18 0646  Weight: 73.8 kg 72.6 kg 72.5 kg    Examination:  General exam: No distress.  Looks older than stated age. Respiratory system: Bilateral decreased breath sounds at bases Cardiovascular system: Rate controlled, S1-2 heard Gastrointestinal system: Abdomen is nondistended,  soft and nontender. Normal bowel sounds heard. Extremities: left elbow dressing present; no cyanosis or clubbing   Data Reviewed: I have personally reviewed following labs and imaging studies  CBC: Recent Labs  Lab 11/05/18 1710 11/06/18 0300 11/07/18 0547 11/08/18 0451  WBC 6.8 5.4 5.7 7.5  NEUTROABS  --   --  4.1 4.5  HGB 12.1* 12.1* 13.5 12.0*   HCT 34.0* 33.9* 37.1* 33.7*  MCV 90.7 89.7 88.5 90.6  PLT 134* 120* 147* 998*   Basic Metabolic Panel: Recent Labs  Lab 11/05/18 1710 11/06/18 0300 11/06/18 2312 11/07/18 0547 11/08/18 0451  NA 130* 132*  --  129* 133*  K 3.8 4.2  --  4.5 4.3  CL 96* 101  --  99 102  CO2 23 24  --  21* 22  GLUCOSE 385* 266* 472* 263* 223*  BUN 12 11  --  15 20  CREATININE 0.90 0.80  --  0.86 0.73  CALCIUM 8.3* 8.3*  --  8.9 8.4*  MG  --   --   --  1.5* 1.6*   GFR: Estimated Creatinine Clearance: 87.2 mL/min (by C-G formula based on SCr of 0.73 mg/dL). Liver Function Tests: No results for input(s): AST, ALT, ALKPHOS, BILITOT, PROT, ALBUMIN in the last 168 hours. No results for input(s): LIPASE, AMYLASE in the last 168 hours. No results for input(s): AMMONIA in the last 168 hours. Coagulation Profile: Recent Labs  Lab 11/06/18 0300  INR 1.2   Cardiac Enzymes: No results for input(s): CKTOTAL, CKMB, CKMBINDEX, TROPONINI in the last 168 hours. BNP (last 3 results) No results for input(s): PROBNP in the last 8760 hours. HbA1C: No results for input(s): HGBA1C in the last 72 hours. CBG: Recent Labs  Lab 11/08/18 1156 11/09/18 0751 11/09/18 1203 11/09/18 1717 11/09/18 2055  GLUCAP 184* 244* 150* 128* 277*   Lipid Profile: No results for input(s): CHOL, HDL, LDLCALC, TRIG, CHOLHDL, LDLDIRECT in the last 72 hours. Thyroid Function Tests: No results for input(s): TSH, T4TOTAL, FREET4, T3FREE, THYROIDAB in the last 72 hours. Anemia Panel: No results for input(s): VITAMINB12, FOLATE, FERRITIN, TIBC, IRON, RETICCTPCT in the last 72 hours. Sepsis Labs: Recent Labs  Lab 11/05/18 1710  LATICACIDVEN 1.7    Recent Results (from the past 240 hour(s))  Surgical pcr screen     Status: Abnormal   Collection Time: 11/05/18  4:30 PM  Result Value Ref Range Status   MRSA, PCR POSITIVE (A) NEGATIVE Final    Comment: RESULT CALLED TO, READ BACK BY AND VERIFIED WITHDaron Offer RN [phone] 11/05/18  A BROWNING    Staphylococcus aureus POSITIVE (A) NEGATIVE Final    Comment: (NOTE) The Xpert SA Assay (FDA approved for NASAL specimens in patients 75 years of age and older), is one component of a comprehensive surveillance program. It is not intended to diagnose infection nor to guide or monitor treatment. Performed at [facility], [place] [address] [place], [place] [address]   Culture, blood (routine x 2)     Status: None (Preliminary result)   Collection Time: 11/05/18  5:10 PM  Result Value Ref Range Status   Specimen Description BLOOD RIGHT ANTECUBITAL  Final   Special Requests   Final    BOTTLES DRAWN AEROBIC ONLY Blood Culture adequate volume   Culture   Final    NO GROWTH 4 DAYS Performed at [facility], [place] [address] [place], [place] [address]    Report Status PENDING  Incomplete  Culture, blood (routine x 2)     Status: None (Preliminary result)   Collection Time: 11/05/18  5:15 PM  Result Value Ref Range Status   Specimen Description BLOOD RIGHT ANTECUBITAL  Final   Special Requests   Final    BOTTLES DRAWN AEROBIC ONLY Blood Culture adequate volume   Culture   Final    NO GROWTH 4 DAYS Performed at [facility], [place] [address], [place], [place] [address]    Report Status PENDING  Incomplete         Radiology Studies: No results found.      Scheduled Meds: . Chlorhexidine Gluconate Cloth  6 each Topical Q0600  . enoxaparin (LOVENOX) injection  40 mg Subcutaneous Q24H  . insulin aspart  0-15 Units Subcutaneous TID WC  . insulin aspart  0-5 Units Subcutaneous QHS  . insulin aspart  7 Units Subcutaneous TID WC  . insulin detemir  40 Units Subcutaneous Daily  . mupirocin ointment  1 application Nasal BID  . nicotine  21 mg Transdermal Daily  . sodium chloride flush  10-40 mL Intracatheter Q12H  . sodium chloride flush  10-40 mL Intracatheter Q12H  . sodium chloride flush  3 mL Intravenous Q12H   Continuous Infusions: . lactated  ringers 10 mL/hr at 11/06/18 1719  . vancomycin 1,000 mg (11/10/18 0534)     LOS: 5 days        Aline August, MD [facility] 11/10/2018, 7:40 AM

## 2018-11-10 NOTE — Progress Notes (Signed)
Inpatient Diabetes Program Recommendations  AACE/ADA: New Consensus Statement on Inpatient Glycemic Control (2015)  Target Ranges:  Prepandial:   less than 140 mg/dL      Peak postprandial:   less than 180 mg/dL (1-2 hours)      Critically ill patients:  140 - 180 mg/dL   Lab Results  Component Value Date   GLUCAP 125 (H) 11/10/2018   HGBA1C 11.5 (H) 09/02/2018    Results for NUR, KRASINSKI" (MRN [id number]) as of 11/10/2018 14:13  Ref. Range 11/09/2018 07:51 11/09/2018 12:03 11/09/2018 17:17 11/09/2018 20:55 11/10/2018 08:29 11/10/2018 12:55  Glucose-Capillary Latest Ref Range: 70 - 99 mg/dL 244 (H)  Novolog 12 units (7 meal coverage and 5 correction) 150 (H)  Novolog 9 units (7 units meal coverage and 2 units correction) 128 (H)  Novolog 9 units (7 units meal coverage and 2 units correction) 277 (H)  Novolog 3 units correction 305 (H)  Novolog 18 units (7 units meal coverage and 11 units correction) 125 (H)  Novolog 9 units (7 units meal coverage and 2 units correction)      Review of Glycemic Control  Diabetes history: DM2  Outpatient Diabetes medications: Metformin 500 mg BID, Levemir 40 units QD, Glipizide 5 mg QD  Current orders for Inpatient glycemic control: Levemir 40 units daily                                                                          Novolog moderate correction scale TID ac and (0-5 units) hs                                                                          Novolog 7 units tid meal coverage     Levemir increased to 40 units (home dose) yesterday am. Fasting blood glucose still remains above goal. Recommend increasing basal insulin. Patient has received 20 Units of Novolog correction in a 24 hour period (6503 yesterday to 0829 today).    MD please consider increasing Levemir to 45 units daily.   Thank you   -- Will follow during hospitalization.--  Jonna Clark RN, MSN Diabetes Coordinator Inpatient Glycemic Control Team Team  Pager: [phone] (8am-5pm)

## 2018-11-10 NOTE — Progress Notes (Signed)
Physical Therapy Treatment Patient Details Name: Juan Juarez MRN: [id number] DOB: 17-Jun-1956 Today's Date: 11/10/2018    History of Present Illness Pt is a 63 y/o male admitted secondary to L elbow pain with infection, now s/p Incision and drainage above, application of antibiotic impregnated beads. PMH including but not limited to DM, HTN, polysubstance abuse and head injury s/p burr hole.    PT Comments    Upon entering room, pt reports: " well there ain't nothing wrong with my legs."  Pt ambulating in hallway well and reports only issue is difficulty with L UE.  Pt with swelling in L hand so emphasized gentle open/closing of hand/fingers and elevating when resting.  Pt would benefit from f/u for L UE once appropriate (per Surgeon).   Follow Up Recommendations  Outpatient PT(per surgeon)     Equipment Recommendations  None recommended by PT    Recommendations for Other Services       Precautions / Restrictions Precautions Precautions: None Precaution Comments: L UE splinted    Mobility  Bed Mobility Overal bed mobility: Independent                Transfers Overall transfer level: Modified independent                  Ambulation/Gait Ambulation/Gait assistance: Supervision Gait Distance (Feet): 400 Feet Assistive device: IV Pole Gait Pattern/deviations: WFL(Within Functional Limits)     General Gait Details: pt pushed IV pole however did not appear to need for support, supervision for safety   Stairs             Wheelchair Mobility    Modified Rankin (Stroke Patients Only)       Balance                                            Cognition Arousal/Alertness: Awake/alert Behavior During Therapy: WFL for tasks assessed/performed Overall Cognitive Status: Within Functional Limits for tasks assessed                                 General Comments: appropriate during session      Exercises       General Comments        Pertinent Vitals/Pain Pain Assessment: Faces Faces Pain Scale: Hurts little more Pain Location: L elbow Pain Descriptors / Indicators: Sore Pain Intervention(s): Monitored during session;Repositioned    Home Living                      Prior Function            PT Goals (current goals can now be found in the care plan section) Progress towards PT goals: Progressing toward goals    Frequency    Min 2X/week      PT Plan Discharge plan needs to be updated    Co-evaluation              AM-PAC PT "6 Clicks" Mobility   Outcome Measure  Help needed turning from your back to your side while in a flat bed without using bedrails?: None Help needed moving from lying on your back to sitting on the side of a flat bed without using bedrails?: None Help needed moving to and from a bed to a chair (including a  wheelchair)?: A Little Help needed standing up from a chair using your arms (e.g., wheelchair or bedside chair)?: A Little Help needed to walk in hospital room?: A Little Help needed climbing 3-5 steps with a railing? : A Little 6 Click Score: 20    End of Session   Activity Tolerance: Patient tolerated treatment well Patient left: in bed   PT Visit Diagnosis: Pain     Time: 8016-5537 PT Time Calculation (min) (ACUTE ONLY): 16 min  Charges:  $Gait Training: 8-22 mins                     Carmelia Bake, PT, [place] Office: [phone] Pager: [phone]  Trena Platt 11/10/2018, 4:03 PM

## 2018-11-11 LAB — GLUCOSE, CAPILLARY
Glucose-Capillary: 231 mg/dL — ABNORMAL HIGH (ref 70–99)
Glucose-Capillary: 207 mg/dL — ABNORMAL HIGH (ref 70–99)
Glucose-Capillary: 254 mg/dL — ABNORMAL HIGH (ref 70–99)
Glucose-Capillary: 122 mg/dL — ABNORMAL HIGH (ref 70–99)

## 2018-11-11 LAB — CBC WITH DIFFERENTIAL/PLATELET
Abs Immature Granulocytes: 0.03 10*3/uL (ref 0.00–0.07)
Basophils Absolute: 0 10*3/uL (ref 0.0–0.1)
Basophils Relative: 1 %
Eosinophils Absolute: 0.1 10*3/uL (ref 0.0–0.5)
Eosinophils Relative: 2 %
HCT: 33.8 % — ABNORMAL LOW (ref 39.0–52.0)
Hemoglobin: 11.6 g/dL — ABNORMAL LOW (ref 13.0–17.0)
Immature Granulocytes: 1 %
Lymphocytes Relative: 42 %
Lymphs Abs: 1.8 10*3/uL (ref 0.7–4.0)
MCH: 32 pg (ref 26.0–34.0)
MCHC: 34.3 g/dL (ref 30.0–36.0)
MCV: 93.4 fL (ref 80.0–100.0)
Monocytes Absolute: 0.4 10*3/uL (ref 0.1–1.0)
Monocytes Relative: 10 %
Neutro Abs: 1.9 10*3/uL (ref 1.7–7.7)
Neutrophils Relative %: 44 %
Platelets: 129 10*3/uL — ABNORMAL LOW (ref 150–400)
RBC: 3.62 MIL/uL — ABNORMAL LOW (ref 4.22–5.81)
RDW: 14.6 % (ref 11.5–15.5)
WBC: 4.3 10*3/uL (ref 4.0–10.5)
nRBC: 0 % (ref 0.0–0.2)

## 2018-11-11 LAB — BASIC METABOLIC PANEL
Anion gap: 7 (ref 5–15)
BUN: 15 mg/dL (ref 8–23)
CO2: 25 mmol/L (ref 22–32)
Calcium: 8.3 mg/dL — ABNORMAL LOW (ref 8.9–10.3)
Chloride: 101 mmol/L (ref 98–111)
Creatinine, Ser: 0.81 mg/dL (ref 0.61–1.24)
GFR calc Af Amer: >60 mL/min (ref >60)
GFR calc non Af Amer: >60 mL/min (ref >60)
Glucose, Bld: 239 mg/dL — ABNORMAL HIGH (ref 70–99)
Potassium: 3.9 mmol/L (ref 3.5–5.1)
Sodium: 133 mmol/L — ABNORMAL LOW (ref 135–145)

## 2018-11-11 LAB — MAGNESIUM: Magnesium: 1.5 mg/dL — ABNORMAL LOW (ref 1.7–2.4)

## 2018-11-11 MED ORDER — MAGNESIUM SULFATE 2 GM/50ML IV SOLN
2.0000 g | Freq: Once | INTRAVENOUS | Status: AC
  Administered 2018-11-11: 2 g via INTRAVENOUS
  Filled 2018-11-11: qty 50

## 2018-11-11 NOTE — Progress Notes (Signed)
Pharmacy Antibiotic Note  Juan Juarez is a 63 y.o. male admitted on 11/05/2018 with left elbow infection being admitted from ID clinic.  Pharmacy has been consulted for vancomycin dosing. Recent admit and I&D that grew MRSA; sent out on Bactrim DS. Renal function from previous admit normal. Afebrile, no WBC.   Plan: Vancomycin to 1500 mg iv Q 24 for predicted AUC of 416 Continue to follow   Height: 5\' 7"  (170.2 cm) Weight: 162 lb 14.7 oz (73.9 kg) IBW/kg (Calculated) : 66.1  Temp (24hrs), Avg:98.4 F (36.9 C), Min:98.1 F (36.7 C), Max:98.8 F (37.1 C)  Recent Labs  Lab 11/05/18 1710 11/06/18 0300 11/07/18 0547 11/08/18 0451 11/08/18 1525 11/08/18 2021 11/11/18 0330  WBC 6.8 5.4 5.7 7.5  --   --  4.3  CREATININE 0.90 0.80 0.86 0.73  --   --  0.81  LATICACIDVEN 1.7  --   --   --   --   --   --   VANCOTROUGH  --   --   --   --  14*  --   --   VANCOPEAK  --   --   --   --   --  31  --     Estimated Creatinine Clearance: 86.1 mL/min (by C-G formula based on SCr of 0.81 mg/dL).    No Known Allergies  Antimicrobials this admission: vancomycin 5/6 >>   Dose adjustments this admission: Dose decreased to 1500mg  IV q24h  Microbiology results: 4/24 left elbow joint 2/2: MRSA    Ulysees Robarts A. Levada Dy, PharmD, [place] Please utilize Amion for appropriate phone number to reach the unit pharmacist ([place])

## 2018-11-11 NOTE — Progress Notes (Signed)
Responded to consult to check bleeding at PICC line site. Dressing changed. Note line 2cm mark. Small clot formed at exit site. Clot left intact to prevent new bleeding. Flushes easily with good blood return. Pt denies pain/discomfort.

## 2018-11-11 NOTE — Progress Notes (Signed)
Patient ID: Juan Juarez, male   DOB: 26-Jul-1955, 63 y.o.   MRN: [id number]  PROGRESS NOTE    Juan Juarez  [id number]:[id number] DOB: 1955-08-17 DOA: 11/05/2018 PCP: Glenda Chroman, MD   Brief Narrative:  63 year old male with history of hypertension, hyperlipidemia, hepatitis C, diabetes mellitus type 2, inflammatory arthropathy, alcohol abuse, SDH status post craniotomy in 12/2017, recent I&D with biopsy of left forearm performed by Dr. Burney Gauze on 10/24/2018 with subsequent cultures positive for MRSA presented with increased left arm pain and drainage.  He had followed up with Dr. Paulino Door who spoke to Dr. Burney Gauze and recommended repeat I&D.  He was started on IV antibiotics.  He underwent I&D on 11/06/2018.  Assessment & Plan:   Principal Problem:   Infected elbow ([place]) Active Problems:   Diabetes mellitus with hyperglycemia (HCC)   Thrombocytopenia (HCC)   Tobacco abuse   Chronic hepatitis C without hepatic coma (HCC)   Probable MRSA left elbow infection/osteomyelitis and likely septic arthritis -Patient had recent I&D with biopsy done on 10/24/2018: Pathology revealed chronic inflammatory tissue with no evidence of gout.  Cultures positive for MRSA and patient was started on Bactrim on 11/04/2018 but because of worsening pain and drainage, he was referred for IV antibiotics and need for I&D -Status post I&D by Dr. Burney Gauze on 11/06/2018.  -Continue IV vancomycin.  ID following and recommending at least 3 weeks of IV antibiotics followed by prolonged oral therapy, depending on progress. -Status post PICC placement on 11/08/2018.  Because of history of drug abuse in the past, he will not be able to be discharged home with a PICC line.  Social worker consulted for rehab placement. -Continue IV vancomycin till 11/27/2018.  Monitor labs intermittently. -Afebrile currently hemodynamically stable.  Diabetes mellitus type 2 uncontrolled with hyperglycemia -A1c was 11.5 on 09/02/2018.  Oral meds on hold.    -Blood sugars improving.  Continue Levemir and NovoLog along with sliding scale insulin.  Tobacco abuse -Continue nicotine patch.  Patient needs to stop smoking  History of hepatitis C and cirrhosis -Outpatient follow-up with PCP.  No evidence of ascites on physical exam  Chronic thrombocytopenia -No signs of bleeding.  Monitor  History of drug abuse -Patient states that he has not used cocaine for the last few months.  -Drug screen positive for opiates and benzos.  Hypomagnesemia -Replace.  Repeat a.m. labs  DVT prophylaxis:  Lovenox Code Status: Full Family Communication: None at bedside Disposition Plan: SNF once bed is available.  Alternatively, patient will remain inpatient till 11/27/2018 to complete his antibiotic treatment.  Consultants: Orthopedic/ID  Procedures: I&D of left elbow on 11/06/2018.  Antimicrobials: Vancomycin from 11/05/2018 onwards   Subjective: Patient seen and examined at bedside.  Denies any overnight fever, nausea, vomiting or worsening left upper extremity pain.    Objective: Vitals:   11/10/18 0646 11/10/18 1501 11/10/18 2115 11/11/18 0515  BP: (!) 149/78 109/72 120/78 (!) 158/76  Pulse: 72 74 80 69  Resp: 18 17 16 16   Temp: 98.4 F (36.9 C) 98.4 F (36.9 C) 98.8 F (37.1 C) 98.1 F (36.7 C)  TempSrc: Oral Oral Oral Oral  SpO2: 99% 99% 98% 99%  Weight: 72.5 kg   73.9 kg  Height:        Intake/Output Summary (Last 24 hours) at 11/11/2018 0746 Last data filed at 11/10/2018 1900 Gross per 24 hour  Intake 1200 ml  Output -  Net 1200 ml   Filed Weights   11/09/18 0554 11/10/18 0646 11/11/18  0515  Weight: 72.6 kg 72.5 kg 73.9 kg    Examination:  General exam: No acute distress.  Looks older than stated age Respiratory system: Bilateral decreased breath sounds at bases, no wheezing Cardiovascular system: S1-S2 heard, rate controlled Gastrointestinal system: Abdomen is nondistended, soft and nontender. Normal bowel sounds heard.  Extremities: left elbow dressing present; no cyanosis or clubbing   Data Reviewed: I have personally reviewed following labs and imaging studies  CBC: Recent Labs  Lab 11/05/18 1710 11/06/18 0300 11/07/18 0547 11/08/18 0451 11/11/18 0330  WBC 6.8 5.4 5.7 7.5 4.3  NEUTROABS  --   --  4.1 4.5 1.9  HGB 12.1* 12.1* 13.5 12.0* 11.6*  HCT 34.0* 33.9* 37.1* 33.7* 33.8*  MCV 90.7 89.7 88.5 90.6 93.4  PLT 134* 120* 147* 128* 474*   Basic Metabolic Panel: Recent Labs  Lab 11/05/18 1710 11/06/18 0300 11/06/18 2312 11/07/18 0547 11/08/18 0451 11/11/18 0330  NA 130* 132*  --  129* 133* 133*  K 3.8 4.2  --  4.5 4.3 3.9  CL 96* 101  --  99 102 101  CO2 23 24  --  21* 22 25  GLUCOSE 385* 266* 472* 263* 223* 239*  BUN 12 11  --  15 20 15   CREATININE 0.90 0.80  --  0.86 0.73 0.81  CALCIUM 8.3* 8.3*  --  8.9 8.4* 8.3*  MG  --   --   --  1.5* 1.6* 1.5*   GFR: Estimated Creatinine Clearance: 86.1 mL/min (by C-G formula based on SCr of 0.81 mg/dL). Liver Function Tests: No results for input(s): AST, ALT, ALKPHOS, BILITOT, PROT, ALBUMIN in the last 168 hours. No results for input(s): LIPASE, AMYLASE in the last 168 hours. No results for input(s): AMMONIA in the last 168 hours. Coagulation Profile: Recent Labs  Lab 11/06/18 0300  INR 1.2   Cardiac Enzymes: No results for input(s): CKTOTAL, CKMB, CKMBINDEX, TROPONINI in the last 168 hours. BNP (last 3 results) No results for input(s): PROBNP in the last 8760 hours. HbA1C: No results for input(s): HGBA1C in the last 72 hours. CBG: Recent Labs  Lab 11/09/18 2055 11/10/18 0829 11/10/18 1255 11/10/18 1612 11/10/18 2110  GLUCAP 277* 305* 125* 104* 185*   Lipid Profile: No results for input(s): CHOL, HDL, LDLCALC, TRIG, CHOLHDL, LDLDIRECT in the last 72 hours. Thyroid Function Tests: No results for input(s): TSH, T4TOTAL, FREET4, T3FREE, THYROIDAB in the last 72 hours. Anemia Panel: No results for input(s): VITAMINB12,  FOLATE, FERRITIN, TIBC, IRON, RETICCTPCT in the last 72 hours. Sepsis Labs: Recent Labs  Lab 11/05/18 1710  LATICACIDVEN 1.7    Recent Results (from the past 240 hour(s))  Surgical pcr screen     Status: Abnormal   Collection Time: 11/05/18  4:30 PM  Result Value Ref Range Status   MRSA, PCR POSITIVE (A) NEGATIVE Final    Comment: RESULT CALLED TO, READ BACK BY AND VERIFIED WITHDaron Offer RN [phone] 11/05/18 A BROWNING    Staphylococcus aureus POSITIVE (A) NEGATIVE Final    Comment: (NOTE) The Xpert SA Assay (FDA approved for NASAL specimens in patients 64 years of age and older), is one component of a comprehensive surveillance program. It is not intended to diagnose infection nor to guide or monitor treatment. Performed at [facility], [place] [address] [place], [place] [address]   Culture, blood (routine x 2)     Status: None   Collection Time: 11/05/18  5:10 PM  Result Value Ref Range  Status   Specimen Description BLOOD RIGHT ANTECUBITAL  Final   Special Requests   Final    BOTTLES DRAWN AEROBIC ONLY Blood Culture adequate volume   Culture   Final    NO GROWTH 5 DAYS Performed at [facility], [address] [place], [place] [address]    Report Status 11/10/2018 FINAL  Final  Culture, blood (routine x 2)     Status: None   Collection Time: 11/05/18  5:15 PM  Result Value Ref Range Status   Specimen Description BLOOD RIGHT ANTECUBITAL  Final   Special Requests   Final    BOTTLES DRAWN AEROBIC ONLY Blood Culture adequate volume   Culture   Final    NO GROWTH 5 DAYS Performed at [facility], [place] [address] [place], [place] [address]    Report Status 11/10/2018 FINAL  Final         Radiology Studies: No results found.      Scheduled Meds: . enoxaparin (LOVENOX) injection  40 mg Subcutaneous Q24H  . insulin aspart  0-15 Units Subcutaneous TID WC  . insulin aspart  0-5 Units Subcutaneous QHS  . insulin aspart  7 Units Subcutaneous TID WC   . insulin detemir  45 Units Subcutaneous Daily  . nicotine  21 mg Transdermal Daily  . senna-docusate  1 tablet Oral BID  . sodium chloride flush  10-40 mL Intracatheter Q12H  . sodium chloride flush  10-40 mL Intracatheter Q12H  . sodium chloride flush  3 mL Intravenous Q12H   Continuous Infusions: . lactated ringers 10 mL/hr at 11/06/18 1719  . vancomycin 1,500 mg (11/11/18 0533)     LOS: 6 days        Aline August, MD [facility] 11/11/2018, 7:46 AM

## 2018-11-12 ENCOUNTER — Telehealth: Payer: Self-pay | Admitting: *Deleted

## 2018-11-12 ENCOUNTER — Ambulatory Visit: Payer: Medicare Other | Admitting: Gastroenterology

## 2018-11-12 LAB — CBC WITH DIFFERENTIAL/PLATELET
Abs Immature Granulocytes: 0.03 10*3/uL (ref 0.00–0.07)
Basophils Absolute: 0 10*3/uL (ref 0.0–0.1)
Basophils Relative: 1 %
Eosinophils Absolute: 0.1 10*3/uL (ref 0.0–0.5)
Eosinophils Relative: 2 %
HCT: 33.5 % — ABNORMAL LOW (ref 39.0–52.0)
Hemoglobin: 11.5 g/dL — ABNORMAL LOW (ref 13.0–17.0)
Immature Granulocytes: 1 %
Lymphocytes Relative: 42 %
Lymphs Abs: 1.6 10*3/uL (ref 0.7–4.0)
MCH: 31.9 pg (ref 26.0–34.0)
MCHC: 34.3 g/dL (ref 30.0–36.0)
MCV: 93.1 fL (ref 80.0–100.0)
Monocytes Absolute: 0.4 10*3/uL (ref 0.1–1.0)
Monocytes Relative: 12 %
Neutro Abs: 1.6 10*3/uL — ABNORMAL LOW (ref 1.7–7.7)
Neutrophils Relative %: 42 %
Platelets: 123 10*3/uL — ABNORMAL LOW (ref 150–400)
RBC: 3.6 MIL/uL — ABNORMAL LOW (ref 4.22–5.81)
RDW: 14.5 % (ref 11.5–15.5)
WBC: 3.8 10*3/uL — ABNORMAL LOW (ref 4.0–10.5)
nRBC: 0 % (ref 0.0–0.2)

## 2018-11-12 LAB — BASIC METABOLIC PANEL
Anion gap: 5 (ref 5–15)
BUN: 13 mg/dL (ref 8–23)
CO2: 27 mmol/L (ref 22–32)
Calcium: 8.3 mg/dL — ABNORMAL LOW (ref 8.9–10.3)
Chloride: 106 mmol/L (ref 98–111)
Creatinine, Ser: 1.04 mg/dL (ref 0.61–1.24)
GFR calc Af Amer: >60 mL/min (ref >60)
GFR calc non Af Amer: >60 mL/min (ref >60)
Glucose, Bld: 223 mg/dL — ABNORMAL HIGH (ref 70–99)
Potassium: 4 mmol/L (ref 3.5–5.1)
Sodium: 138 mmol/L (ref 135–145)

## 2018-11-12 LAB — GLUCOSE, CAPILLARY
Glucose-Capillary: 211 mg/dL — ABNORMAL HIGH (ref 70–99)
Glucose-Capillary: 354 mg/dL — ABNORMAL HIGH (ref 70–99)
Glucose-Capillary: 248 mg/dL — ABNORMAL HIGH (ref 70–99)
Glucose-Capillary: 101 mg/dL — ABNORMAL HIGH (ref 70–99)

## 2018-11-12 LAB — C-REACTIVE PROTEIN: CRP: <0.8 mg/dL (ref <1.0)

## 2018-11-12 LAB — MAGNESIUM: Magnesium: 1.7 mg/dL (ref 1.7–2.4)

## 2018-11-12 NOTE — Progress Notes (Signed)
Patient ID: Juan Juarez, male   DOB: 01-Nov-1955, 63 y.o.   MRN: [id number]  PROGRESS NOTE    Juan Juarez  [id number]:[id number] DOB: [birth year] DOA: 11/05/2018 PCP: Glenda Chroman, MD   Brief Narrative:  63 year old male with history of hypertension, hyperlipidemia, hepatitis C, diabetes mellitus type 2, inflammatory arthropathy, alcohol abuse, SDH status post craniotomy in 12/2017, recent I&D with biopsy of left forearm performed by Dr. Burney Gauze on 10/24/2018 with subsequent cultures positive for MRSA presented with increased left arm pain and drainage.  He had followed up with Dr. Paulino Door who spoke to Dr. Burney Gauze and recommended repeat I&D.  He was started on IV antibiotics.  He underwent I&D on 11/06/2018.  Assessment & Plan:     MRSA left elbow infection/osteomyelitis and likely septic arthritis -severe current problem recent I&D done on 10/24/2018, at that time pathology revealed chronic inflammation without gout.  Cultures were positive for MRSA.  He was started on Bactrim on 11/04/2018, eventually this admission had I&D again on 11/06/2018 by Dr. Burney Gauze.  For now on IV vancomycin per ID.  Has a right arm PICC line which was placed on 11/08/2018, vancomycin stop date is 11/27/2018.  Due to IV drug use will finish antibiotic course here.  He has some swelling in the left arm postop and has been requested to keep the left arm propped up when sitting and when in bed with 3 pillows.  Appropriate orders placed.     Diabetes mellitus type 2 uncontrolled with hyperglycemia -A1c was 11.5 on 09/02/2018.  Oral meds on hold.   -Blood sugars improving.  Continue Levemir and NovoLog along with sliding scale insulin.  CBG (last 3)  Recent Labs    11/11/18 1648 11/11/18 2104 11/12/18 0822  GLUCAP 254* 122* 211*     Tobacco abuse -Continue nicotine patch.  Patient needs to stop smoking  History of hepatitis C and cirrhosis -Outpatient follow-up with PCP.  No evidence of ascites on physical exam  Chronic  thrombocytopenia -No signs of bleeding.  Monitor  History of drug abuse -Patient states that he has not used cocaine for the last few months.  -Drug screen positive for opiates and benzos.      DVT prophylaxis:  Lovenox Code Status: Full Family Communication: None at bedside Disposition Plan: SNF once bed is available.  Alternatively, patient will remain inpatient till 11/27/2018 to complete his antibiotic treatment.  Consultants: Orthopedic/ID  Procedures: I&D of left elbow on 11/06/2018.  Antimicrobials: Vancomycin from 11/05/2018 onwards   Subjective:  Patient in bed, appears comfortable, denies any headache, no fever, no chest pain or pressure, no shortness of breath , no abdominal pain. No focal weakness. Mild L elbow pain.  Objective: Vitals:   11/11/18 1517 11/11/18 2108 11/12/18 0513 11/12/18 0810  BP: (!) 143/77 (!) 144/70 (!) 158/84   Pulse: 75 69 67   Resp: 17 16 16 17   Temp: 98 F (36.7 C) 97.6 F (36.4 C) 98.2 F (36.8 C)   TempSrc: Oral Oral Oral   SpO2: 99% 98% 99%   Weight:   73.7 kg   Height:        Intake/Output Summary (Last 24 hours) at 11/12/2018 1215 Last data filed at 11/12/2018 0824 Gross per 24 hour  Intake 2308.76 ml  Output -  Net 2308.76 ml   Filed Weights   11/10/18 0646 11/11/18 0515 11/12/18 0513  Weight: 72.5 kg 73.9 kg 73.7 kg    Examination:  General exam: No acute distress.  Looks  older than stated age Respiratory system: Bilateral decreased breath sounds at bases, no wheezing Cardiovascular system: S1-S2 heard, rate controlled Gastrointestinal system: Abdomen is nondistended, soft and nontender. Normal bowel sounds heard. Extremities: left elbow dressing present; no cyanosis or clubbing   Data Reviewed: I have personally reviewed following labs and imaging studies  CBC: Recent Labs  Lab 11/06/18 0300 11/07/18 0547 11/08/18 0451 11/11/18 0330 11/12/18 0415  WBC 5.4 5.7 7.5 4.3 3.8*  NEUTROABS  --  4.1 4.5 1.9 1.6*   HGB 12.1* 13.5 12.0* 11.6* 11.5*  HCT 33.9* 37.1* 33.7* 33.8* 33.5*  MCV 89.7 88.5 90.6 93.4 93.1  PLT 120* 147* 128* 129* 496*   Basic Metabolic Panel: Recent Labs  Lab 11/06/18 0300 11/06/18 2312 11/07/18 0547 11/08/18 0451 11/11/18 0330 11/12/18 0415  NA 132*  --  129* 133* 133* 138  K 4.2  --  4.5 4.3 3.9 4.0  CL 101  --  99 102 101 106  CO2 24  --  21* 22 25 27   GLUCOSE 266* 472* 263* 223* 239* 223*  BUN 11  --  15 20 15 13   CREATININE 0.80  --  0.86 0.73 0.81 1.04  CALCIUM 8.3*  --  8.9 8.4* 8.3* 8.3*  MG  --   --  1.5* 1.6* 1.5* 1.7   GFR: Estimated Creatinine Clearance: 67.1 mL/min (by C-G formula based on SCr of 1.04 mg/dL). Liver Function Tests: No results for input(s): AST, ALT, ALKPHOS, BILITOT, PROT, ALBUMIN in the last 168 hours. No results for input(s): LIPASE, AMYLASE in the last 168 hours. No results for input(s): AMMONIA in the last 168 hours. Coagulation Profile: Recent Labs  Lab 11/06/18 0300  INR 1.2   Cardiac Enzymes: No results for input(s): CKTOTAL, CKMB, CKMBINDEX, TROPONINI in the last 168 hours. BNP (last 3 results) No results for input(s): PROBNP in the last 8760 hours. HbA1C: No results for input(s): HGBA1C in the last 72 hours. CBG: Recent Labs  Lab 11/11/18 0759 11/11/18 1239 11/11/18 1648 11/11/18 2104 11/12/18 0822  GLUCAP 231* 207* 254* 122* 211*   Lipid Profile: No results for input(s): CHOL, HDL, LDLCALC, TRIG, CHOLHDL, LDLDIRECT in the last 72 hours. Thyroid Function Tests: No results for input(s): TSH, T4TOTAL, FREET4, T3FREE, THYROIDAB in the last 72 hours. Anemia Panel: No results for input(s): VITAMINB12, FOLATE, FERRITIN, TIBC, IRON, RETICCTPCT in the last 72 hours. Sepsis Labs: Recent Labs  Lab 11/05/18 1710  LATICACIDVEN 1.7    Recent Results (from the past 240 hour(s))  Surgical pcr screen     Status: Abnormal   Collection Time: 11/05/18  4:30 PM  Result Value Ref Range Status   MRSA, PCR POSITIVE (A)  NEGATIVE Final    Comment: RESULT CALLED TO, READ BACK BY AND VERIFIED WITHDaron Offer RN [phone] 11/05/18 A BROWNING    Staphylococcus aureus POSITIVE (A) NEGATIVE Final    Comment: (NOTE) The Xpert SA Assay (FDA approved for NASAL specimens in patients 3 years of age and older), is one component of a comprehensive surveillance program. It is not intended to diagnose infection nor to guide or monitor treatment. Performed at [facility], [place] [address], [place], [place] [address]   Culture, blood (routine x 2)     Status: None   Collection Time: 11/05/18  5:10 PM  Result Value Ref Range Status   Specimen Description BLOOD RIGHT ANTECUBITAL  Final   Special Requests   Final    BOTTLES DRAWN AEROBIC ONLY Blood  Culture adequate volume   Culture   Final    NO GROWTH 5 DAYS Performed at [facility], [place] [address] [place], [place] [address]    Report Status 11/10/2018 FINAL  Final  Culture, blood (routine x 2)     Status: None   Collection Time: 11/05/18  5:15 PM  Result Value Ref Range Status   Specimen Description BLOOD RIGHT ANTECUBITAL  Final   Special Requests   Final    BOTTLES DRAWN AEROBIC ONLY Blood Culture adequate volume   Culture   Final    NO GROWTH 5 DAYS Performed at [facility], [place] [address], [place], [place] [address]    Report Status 11/10/2018 FINAL  Final     Radiology Studies: No results found.  Scheduled Meds: . enoxaparin (LOVENOX) injection  40 mg Subcutaneous Q24H  . insulin aspart  0-15 Units Subcutaneous TID WC  . insulin aspart  0-5 Units Subcutaneous QHS  . insulin aspart  7 Units Subcutaneous TID WC  . insulin detemir  45 Units Subcutaneous Daily  . nicotine  21 mg Transdermal Daily  . senna-docusate  1 tablet Oral BID  . sodium chloride flush  10-40 mL Intracatheter Q12H  . sodium chloride flush  10-40 mL Intracatheter Q12H  . sodium chloride flush  3 mL Intravenous Q12H   Continuous Infusions: . lactated ringers 10  mL/hr at 11/06/18 1719  . vancomycin 1,500 mg (11/12/18 0541)     LOS: 7 days    Lala Lund, MD [facility] 11/12/2018, 12:15 PM

## 2018-11-12 NOTE — Progress Notes (Signed)
Physical Therapy Treatment & Discharge Patient Details Name: Juan Juarez MRN: [id number] DOB: Jan 12, 1956 Today's Date: 11/12/2018    History of Present Illness Pt is a 63 y/o male admitted 11/05/18 secondary to L elbow pain with infection, now s/p elbow I&D with antibiotic bead placement. PMH includes DM, HTN, polysubstance abuse, head injury s/p burr hole.   PT Comments    Pt ambulating hallway independently, managing IV pole. Reviewed hand/shoulder ROM and LUE elevation when resting; stressed importance of this. Pt verbalizes he is ready to d/c home and "I may just leave" (RN notified); seems to have poor understanding of current condition and need for continued hospital admission. Pt has met short-term acute PT goals. Will d/c.     Follow Up Recommendations  Outpatient PT(per surgeon)     Equipment Recommendations  None recommended by PT    Recommendations for Other Services       Precautions / Restrictions Precautions Precautions: None Restrictions Weight Bearing Restrictions: No    Mobility  Bed Mobility Overal bed mobility: Independent                Transfers Overall transfer level: Independent Equipment used: None                Ambulation/Gait Ambulation/Gait assistance: Independent   Assistive device: None Gait Pattern/deviations: WFL(Within Functional Limits)   Gait velocity interpretation: 1.31 - 2.62 ft/sec, indicative of limited community ambulator     Stairs Stairs: (Pt declined)           Wheelchair Mobility    Modified Rankin (Stroke Patients Only)       Balance Overall balance assessment: No apparent balance deficits (not formally assessed)                                          Cognition Arousal/Alertness: Awake/alert Behavior During Therapy: WFL for tasks assessed/performed Overall Cognitive Status: No family/caregiver present to determine baseline cognitive functioning Area of Impairment:  Safety/judgement                         Safety/Judgement: Decreased awareness of safety;Decreased awareness of deficits     General Comments: Met patient standing at end of hallway holding a lighter and cigarette, also stating how he needs to leave despite educ on importance of needing to remain admitted. Seems to have poor insight into current condition and not retaining importance of information/education      Exercises Other Exercises Other Exercises: Finger flex/ext, full shoulder ROM (reviewed flex, abd, ext, IR/ER)    General Comments        Pertinent Vitals/Pain Pain Assessment: Faces Faces Pain Scale: Hurts a little bit Pain Location: L elbow Pain Descriptors / Indicators: Sore Pain Intervention(s): Monitored during session    Home Living                      Prior Function            PT Goals (current goals can now be found in the care plan section) Progress towards PT goals: Goals met/education completed, patient discharged from PT    Frequency    Min 2X/week      PT Plan Current plan remains appropriate    Co-evaluation              AM-PAC PT "6 Clicks" Mobility  Outcome Measure  Help needed turning from your back to your side while in a flat bed without using bedrails?: None Help needed moving from lying on your back to sitting on the side of a flat bed without using bedrails?: None Help needed moving to and from a bed to a chair (including a wheelchair)?: None Help needed standing up from a chair using your arms (e.g., wheelchair or bedside chair)?: None Help needed to walk in hospital room?: None Help needed climbing 3-5 steps with a railing? : None 6 Click Score: 24    End of Session   Activity Tolerance: Patient tolerated treatment well Patient left: (standing in room, then went back to walking hallway) Nurse Communication: Mobility status PT Visit Diagnosis: Pain Pain - Right/Left: Left Pain - part of body:  Arm     Time: 1010-1022 PT Time Calculation (min) (ACUTE ONLY): 12 min  Charges:  $Gait Training: 8-22 mins                    Mabeline Caras, PT, DPT Acute Rehabilitation Services  Pager [phone] Office [place] 11/12/2018, 12:15 PM

## 2018-11-12 NOTE — Telephone Encounter (Signed)
Called patient for telephone visit with AB, went straight to VM, VM full. According to chart patient is currently hospitalized.

## 2018-11-13 LAB — BASIC METABOLIC PANEL
Anion gap: 7 (ref 5–15)
BUN: 11 mg/dL (ref 8–23)
CO2: 24 mmol/L (ref 22–32)
Calcium: 8.4 mg/dL — ABNORMAL LOW (ref 8.9–10.3)
Chloride: 106 mmol/L (ref 98–111)
Creatinine, Ser: 0.69 mg/dL (ref 0.61–1.24)
GFR calc Af Amer: >60 mL/min (ref >60)
GFR calc non Af Amer: >60 mL/min (ref >60)
Glucose, Bld: 156 mg/dL — ABNORMAL HIGH (ref 70–99)
Potassium: 3.8 mmol/L (ref 3.5–5.1)
Sodium: 137 mmol/L (ref 135–145)

## 2018-11-13 LAB — CBC
HCT: 33.6 % — ABNORMAL LOW (ref 39.0–52.0)
Hemoglobin: 11.5 g/dL — ABNORMAL LOW (ref 13.0–17.0)
MCH: 31.9 pg (ref 26.0–34.0)
MCHC: 34.2 g/dL (ref 30.0–36.0)
MCV: 93.1 fL (ref 80.0–100.0)
Platelets: 126 10*3/uL — ABNORMAL LOW (ref 150–400)
RBC: 3.61 MIL/uL — ABNORMAL LOW (ref 4.22–5.81)
RDW: 14.4 % (ref 11.5–15.5)
WBC: 4.6 10*3/uL (ref 4.0–10.5)
nRBC: 0 % (ref 0.0–0.2)

## 2018-11-13 LAB — VANCOMYCIN, PEAK: Vancomycin Pk: 26 ug/mL — ABNORMAL LOW (ref 30–40)

## 2018-11-13 LAB — GLUCOSE, CAPILLARY
Glucose-Capillary: 199 mg/dL — ABNORMAL HIGH (ref 70–99)
Glucose-Capillary: 178 mg/dL — ABNORMAL HIGH (ref 70–99)
Glucose-Capillary: 165 mg/dL — ABNORMAL HIGH (ref 70–99)
Glucose-Capillary: 211 mg/dL — ABNORMAL HIGH (ref 70–99)

## 2018-11-13 LAB — MAGNESIUM: Magnesium: 1.5 mg/dL — ABNORMAL LOW (ref 1.7–2.4)

## 2018-11-13 MED ORDER — MAGNESIUM SULFATE 2 GM/50ML IV SOLN
2.0000 g | Freq: Once | INTRAVENOUS | Status: AC
  Administered 2018-11-13: 2 g via INTRAVENOUS
  Filled 2018-11-13: qty 50

## 2018-11-13 NOTE — Progress Notes (Signed)
PROGRESS NOTE        PATIENT DETAILS Name: Juan Juarez Age: 63 y.o. Sex: male Date of Birth: 03/05/1956 Admit Date: 11/05/2018 Admitting Physician Norval Morton, MD [id number], Costella Hatcher, MD  Brief Narrative: Patient is a 63 y.o. male with past medical history of HTN, dyslipidemia, HCV, DM-2, SDH s/p craniotomy in 2019, recent I&D with biopsy of the left forearm on 10/24/2018 (by Dr. Burney Gauze) with subsequent cultures positive for MRSA-presented with worsening left arm pain and drainage, subsequently found to have MRSA left elbow septic arthritis and osteomyelitis.  Underwent repeat I&D on 11/06/2018.  See below for further details  Subjective: No major issues overnight.  Has any chest pain or shortness of breath.  Assessment/Plan: MRSA septic arthritis of the left elbow and osteomyelitis of the distal humerus and proximal radial head: Continue IV vancomycin till 5/28-thereafter patient will require Bactrim and rifampin for additional 3 weeks.  Underwent I&D on 5/7, spoke with Dr. Florene Route will come by either today or tomorrow for dressing change.  Given history of drug use-not a candidate for outpatient IV antimicrobial therapy  DM-2: CBGs stable-continue Levemir 45 units daily, 7 units of NovoLog with meals and SSI.  Follow and adjust  HCV with cirrhosis: Compensated-stable for outpatient follow-up  Thrombocytopenia: Stable-monitor  Tobacco abuse: Continue transdermal nicotine-consult  DVT Prophylaxis: Prophylactic Lovenox   Code Status: Full code   Family Communication: None at bedside  Disposition Plan: Remain inpatient  Antimicrobial agents: Anti-infectives (From admission, onward)   Start     Dose/Rate Route Frequency Ordered Stop   11/11/18 0600  vancomycin (VANCOCIN) 1,500 mg in sodium chloride 0.9 % 500 mL IVPB     1,500 mg 250 mL/hr over 120 Minutes Intravenous Every 24 hours 11/10/18 1514     11/08/18 0000  vancomycin IVPB     1,500 mg Intravenous Every 24 hours 11/08/18 2205 11/28/18 2359   11/07/18 0600  ceFAZolin (ANCEF) IVPB 2g/100 mL premix     2 g 200 mL/hr over 30 Minutes Intravenous On call to O.R. 11/06/18 1334 11/06/18 1600   11/06/18 1554  vancomycin (VANCOCIN) powder  Status:  Discontinued       As needed 11/06/18 1555 11/06/18 1618   11/06/18 1549  gentamicin (GARAMYCIN) injection  Status:  Discontinued       As needed 11/06/18 1553 11/06/18 1618   11/06/18 1339  ceFAZolin (ANCEF) 2-4 GM/100ML-% IVPB    Note to Pharmacy:  [name]   : cabinet override      11/06/18 1339 11/06/18 1530   11/06/18 0600  vancomycin (VANCOCIN) IVPB 1000 mg/200 mL premix  Status:  Discontinued     1,000 mg 200 mL/hr over 60 Minutes Intravenous Every 12 hours 11/05/18 1725 11/10/18 1514   11/05/18 1800  vancomycin (VANCOCIN) 1,500 mg in sodium chloride 0.9 % 500 mL IVPB     1,500 mg 250 mL/hr over 120 Minutes Intravenous  Once 11/05/18 1707 11/06/18 0600      Procedures: None  CONSULTS:  ID and orthopedic surgery  Time spent: 25 minutes-Greater than 50% of this time was spent in counseling, explanation of diagnosis, planning of further management, and coordination of care.  MEDICATIONS: Scheduled Meds: . enoxaparin (LOVENOX) injection  40 mg Subcutaneous Q24H  . insulin aspart  0-15 Units Subcutaneous TID WC  . insulin aspart  0-5 Units Subcutaneous  QHS  . insulin aspart  7 Units Subcutaneous TID WC  . insulin detemir  45 Units Subcutaneous Daily  . nicotine  21 mg Transdermal Daily  . senna-docusate  1 tablet Oral BID  . sodium chloride flush  10-40 mL Intracatheter Q12H  . sodium chloride flush  10-40 mL Intracatheter Q12H  . sodium chloride flush  3 mL Intravenous Q12H   Continuous Infusions: . lactated ringers 10 mL/hr at 11/06/18 1719  . vancomycin 1,500 mg (11/13/18 0513)   PRN Meds:.ALPRAZolam, bisacodyl, hydrALAZINE, morphine injection, ondansetron **OR** ondansetron (ZOFRAN) IV,  oxyCODONE-acetaminophen, polyethylene glycol, sodium chloride flush, sodium chloride flush   PHYSICAL EXAM: Vital signs: Vitals:   11/12/18 2239 11/13/18 0500 11/13/18 0650 11/13/18 1205  BP: (!) 141/57  139/82 140/88  Pulse: 76  68 70  Resp:    19  Temp: 98.3 F (36.8 C)  98.6 F (37 C) 98.6 F (37 C)  TempSrc: Oral  Oral Oral  SpO2: 99%  99% 96%  Weight:  74 kg    Height:       Filed Weights   11/11/18 0515 11/12/18 0513 11/13/18 0500  Weight: 73.9 kg 73.7 kg 74 kg   Body mass index is 25.55 kg/m.   General appearance :Awake, alert, not in any distress.  HEENT: Atraumatic and Normocephalic Neck: supple Resp:Good air entry bilaterally, no added sounds  CVS: S1 S2 regular, no murmurs.  GI: Bowel sounds present, Non tender and not distended with no gaurding, rigidity or rebound.No organomegaly Extremities: B/L Lower Ext shows no edema, both legs are warm to touch Neurology:  speech clear,Non focal, sensation is grossly intact. Musculoskeletal:No digital cyanosis Skin:No Rash, warm and dry Wounds:N/A  I have personally reviewed following labs and imaging studies  LABORATORY DATA: CBC: Recent Labs  Lab 11/07/18 0547 11/08/18 0451 11/11/18 0330 11/12/18 0415 11/13/18 0412  WBC 5.7 7.5 4.3 3.8* 4.6  NEUTROABS 4.1 4.5 1.9 1.6*  --   HGB 13.5 12.0* 11.6* 11.5* 11.5*  HCT 37.1* 33.7* 33.8* 33.5* 33.6*  MCV 88.5 90.6 93.4 93.1 93.1  PLT 147* 128* 129* 123* 126*    Basic Metabolic Panel: Recent Labs  Lab 11/07/18 0547 11/08/18 0451 11/11/18 0330 11/12/18 0415 11/13/18 0412  NA 129* 133* 133* 138 137  K 4.5 4.3 3.9 4.0 3.8  CL 99 102 101 106 106  CO2 21* 22 25 27 24   GLUCOSE 263* 223* 239* 223* 156*  BUN 15 20 15 13 11   CREATININE 0.86 0.73 0.81 1.04 0.69  CALCIUM 8.9 8.4* 8.3* 8.3* 8.4*  MG 1.5* 1.6* 1.5* 1.7 1.5*    GFR: Estimated Creatinine Clearance: 87.2 mL/min (by C-G formula based on SCr of 0.69 mg/dL).  Liver Function Tests: No results for  input(s): AST, ALT, ALKPHOS, BILITOT, PROT, ALBUMIN in the last 168 hours. No results for input(s): LIPASE, AMYLASE in the last 168 hours. No results for input(s): AMMONIA in the last 168 hours.  Coagulation Profile: No results for input(s): INR, PROTIME in the last 168 hours.  Cardiac Enzymes: No results for input(s): CKTOTAL, CKMB, CKMBINDEX, TROPONINI in the last 168 hours.  BNP (last 3 results) No results for input(s): PROBNP in the last 8760 hours.  HbA1C: No results for input(s): HGBA1C in the last 72 hours.  CBG: Recent Labs  Lab 11/12/18 1300 11/12/18 1615 11/12/18 2235 11/13/18 0737 11/13/18 1137  GLUCAP 354* 248* 101* 199* 178*    Lipid Profile: No results for input(s): CHOL, HDL, LDLCALC, TRIG, CHOLHDL, LDLDIRECT  in the last 72 hours.  Thyroid Function Tests: No results for input(s): TSH, T4TOTAL, FREET4, T3FREE, THYROIDAB in the last 72 hours.  Anemia Panel: No results for input(s): VITAMINB12, FOLATE, FERRITIN, TIBC, IRON, RETICCTPCT in the last 72 hours.  Urine analysis:    Component Value Date/Time   COLORURINE YELLOW 09/02/2018 1224   APPEARANCEUR CLEAR 09/02/2018 1224   LABSPEC 1.032 (H) 09/02/2018 1224   PHURINE 6.0 09/02/2018 1224   GLUCOSEU >=500 (A) 09/02/2018 1224   HGBUR SMALL (A) 09/02/2018 1224   BILIRUBINUR NEGATIVE 09/02/2018 1224   KETONESUR NEGATIVE 09/02/2018 1224   PROTEINUR 30 (A) 09/02/2018 1224   UROBILINOGEN 0.2 07/01/2014 2244   NITRITE NEGATIVE 09/02/2018 1224   LEUKOCYTESUR SMALL (A) 09/02/2018 1224    Sepsis Labs: Lactic Acid, Venous    Component Value Date/Time   LATICACIDVEN 1.7 11/05/2018 1710    MICROBIOLOGY: Recent Results (from the past 240 hour(s))  Surgical pcr screen     Status: Abnormal   Collection Time: 11/05/18  4:30 PM  Result Value Ref Range Status   MRSA, PCR POSITIVE (A) NEGATIVE Final    Comment: RESULT CALLED TO, READ BACK BY AND VERIFIED WITHDaron Offer RN 1958 11/05/18 A BROWNING     Staphylococcus aureus POSITIVE (A) NEGATIVE Final    Comment: (NOTE) The Xpert SA Assay (FDA approved for NASAL specimens in patients 72 years of age and older), is one component of a comprehensive surveillance program. It is not intended to diagnose infection nor to guide or monitor treatment. Performed at [facility], [place] [address], [place], [place] [address]   Culture, blood (routine x 2)     Status: None   Collection Time: 11/05/18  5:10 PM  Result Value Ref Range Status   Specimen Description BLOOD RIGHT ANTECUBITAL  Final   Special Requests   Final    BOTTLES DRAWN AEROBIC ONLY Blood Culture adequate volume   Culture   Final    NO GROWTH 5 DAYS Performed at [facility], [place] [address], [place], [place] [address]    Report Status 11/10/2018 FINAL  Final  Culture, blood (routine x 2)     Status: None   Collection Time: 11/05/18  5:15 PM  Result Value Ref Range Status   Specimen Description BLOOD RIGHT ANTECUBITAL  Final   Special Requests   Final    BOTTLES DRAWN AEROBIC ONLY Blood Culture adequate volume   Culture   Final    NO GROWTH 5 DAYS Performed at [facility], [place] [address] [place], [place] [address]    Report Status 11/10/2018 FINAL  Final    RADIOLOGY STUDIES/RESULTS: Dg Elbow Complete Left (3+view)  Result Date: 11/05/2018 CLINICAL DATA:  Elbow infection EXAM: LEFT ELBOW - COMPLETE 3+ VIEW COMPARISON:  09/27/2018 FINDINGS: In the interval, there has been erosion in bone destruction involving the radial head, portions of the lateral humeral condyle and proximal ulna likely related to septic arthritis and osteomyelitis. Elbow joint effusion. Diffuse soft tissue swelling. No dislocation. IMPRESSION: Bone destruction/erosion involving the distal lateral humeral condyle, proximal ulna and radial head compatible with osteomyelitis and likely septic arthritis. Large joint effusion and soft tissue swelling. Electronically Signed   By: Rolm Baptise  M.D.   On: 11/05/2018 19:12   [place] Ekg Site Rite  Result Date: 11/07/2018 If Site Rite image not attached, placement could not be confirmed due to current cardiac rhythm.    LOS: 8 days   Yoona Ishii  Sejla Marzano, MD  [facility]  If 7PM-7AM, please contact night-coverage  Please page via [email or web address]  Go to [email or web address] and use [facility]'s universal password to access. If you do not have the password, please contact the hospital operator.  Locate the [facility] provider you are looking for under [facility] and page to a number that you can be directly reached. If you still have difficulty reaching the provider, please page the [facility] (Director on Call) for the Hospitalists listed on amion for assistance.  11/13/2018, 1:44 PM

## 2018-11-14 LAB — GLUCOSE, CAPILLARY
Glucose-Capillary: 160 mg/dL — ABNORMAL HIGH (ref 70–99)
Glucose-Capillary: 227 mg/dL — ABNORMAL HIGH (ref 70–99)
Glucose-Capillary: 287 mg/dL — ABNORMAL HIGH (ref 70–99)
Glucose-Capillary: 243 mg/dL — ABNORMAL HIGH (ref 70–99)
Glucose-Capillary: 79 mg/dL (ref 70–99)

## 2018-11-14 LAB — VANCOMYCIN, TROUGH: Vancomycin Tr: 8 ug/mL — ABNORMAL LOW (ref 15–20)

## 2018-11-14 MED ORDER — VANCOMYCIN HCL IN DEXTROSE 750-5 MG/150ML-% IV SOLN
750.0000 mg | Freq: Two times a day (BID) | INTRAVENOUS | Status: DC
  Administered 2018-11-15 – 2018-11-22 (×16): 750 mg via INTRAVENOUS
  Filled 2018-11-14 (×18): qty 150

## 2018-11-14 NOTE — Progress Notes (Signed)
PROGRESS NOTE        PATIENT DETAILS Name: Juan Juarez Age: 63 y.o. Sex: male Date of Birth: 1955/12/03 Admit Date: 11/05/2018 Admitting Physician Norval Morton, MD [id number], Costella Hatcher, MD  Brief Narrative: Patient is a 63 y.o. male with past medical history of HTN, dyslipidemia, HCV, DM-2, SDH s/p craniotomy in 2019, recent I&D with biopsy of the left forearm on 10/24/2018 (by Dr. Burney Gauze) with subsequent cultures positive for MRSA-presented with worsening left arm pain and drainage, subsequently found to have MRSA left elbow septic arthritis and osteomyelitis.  Underwent repeat I&D on 11/06/2018.  See below for further details  Subjective: No major issues overnight-denies any chest pain or shortness of breath.  Assessment/Plan: MRSA septic arthritis of the left elbow and osteomyelitis of the distal humerus and proximal radial head: Continue IV vancomycin till 5/28-thereafter patient will require Bactrim and rifampin for additional 3 weeks.  Underwent I&D on 5/7, spoke with Dr. Burney Gauze on 5/14 over the phone-he plans to come by later today for dressing change/reevaluation.  Given patient's prior history of drug use-unfortunately not a candidate for outpatient IV antimicrobial therapy.  DM-2: CBG stable-continue Levemir 45 units daily, 7 units of NovoLog with meals and SSI.  Follow.    HCV with cirrhosis: Compensated-stable for outpatient follow-up  Thrombocytopenia: Stable-monitor  Tobacco abuse: Continue transdermal nicotine-consult  DVT Prophylaxis: Prophylactic Lovenox   Code Status: Full code   Family Communication: None at bedside  Disposition Plan: Remain inpatient  Antimicrobial agents: Anti-infectives (From admission, onward)   Start     Dose/Rate Route Frequency Ordered Stop   11/15/18 0500  vancomycin (VANCOCIN) IVPB 750 mg/150 ml premix     750 mg 150 mL/hr over 60 Minutes Intravenous Every 12 hours 11/14/18 1023     11/11/18 0600   vancomycin (VANCOCIN) 1,500 mg in sodium chloride 0.9 % 500 mL IVPB  Status:  Discontinued     1,500 mg 250 mL/hr over 120 Minutes Intravenous Every 24 hours 11/10/18 1514 11/14/18 1023   11/08/18 0000  vancomycin IVPB     1,500 mg Intravenous Every 24 hours 11/08/18 2205 11/28/18 2359   11/07/18 0600  ceFAZolin (ANCEF) IVPB 2g/100 mL premix     2 g 200 mL/hr over 30 Minutes Intravenous On call to O.R. 11/06/18 1334 11/06/18 1600   11/06/18 1554  vancomycin (VANCOCIN) powder  Status:  Discontinued       As needed 11/06/18 1555 11/06/18 1618   11/06/18 1549  gentamicin (GARAMYCIN) injection  Status:  Discontinued       As needed 11/06/18 1553 11/06/18 1618   11/06/18 1339  ceFAZolin (ANCEF) 2-4 GM/100ML-% IVPB    Note to Pharmacy:  [name]   : cabinet override      11/06/18 1339 11/06/18 1530   11/06/18 0600  vancomycin (VANCOCIN) IVPB 1000 mg/200 mL premix  Status:  Discontinued     1,000 mg 200 mL/hr over 60 Minutes Intravenous Every 12 hours 11/05/18 1725 11/10/18 1514   11/05/18 1800  vancomycin (VANCOCIN) 1,500 mg in sodium chloride 0.9 % 500 mL IVPB     1,500 mg 250 mL/hr over 120 Minutes Intravenous  Once 11/05/18 1707 11/06/18 0600      Procedures: None  CONSULTS:  ID and orthopedic surgery  Time spent: 25 minutes-Greater than 50% of this time was spent in counseling, explanation  of diagnosis, planning of further management, and coordination of care.  MEDICATIONS: Scheduled Meds: . enoxaparin (LOVENOX) injection  40 mg Subcutaneous Q24H  . insulin aspart  0-15 Units Subcutaneous TID WC  . insulin aspart  0-5 Units Subcutaneous QHS  . insulin aspart  7 Units Subcutaneous TID WC  . insulin detemir  45 Units Subcutaneous Daily  . nicotine  21 mg Transdermal Daily  . senna-docusate  1 tablet Oral BID  . sodium chloride flush  10-40 mL Intracatheter Q12H  . sodium chloride flush  10-40 mL Intracatheter Q12H  . sodium chloride flush  3 mL Intravenous Q12H    Continuous Infusions: . lactated ringers 10 mL/hr at 11/06/18 1719  . [START ON 11/15/2018] vancomycin     PRN Meds:.ALPRAZolam, bisacodyl, hydrALAZINE, morphine injection, ondansetron **OR** ondansetron (ZOFRAN) IV, oxyCODONE-acetaminophen, polyethylene glycol, sodium chloride flush, sodium chloride flush   PHYSICAL EXAM: Vital signs: Vitals:   11/13/18 2218 11/14/18 0032 11/14/18 0500 11/14/18 0615  BP: (!) 183/102 (!) 177/81  (!) 173/77  Pulse: 73 71  74  Resp:      Temp: 97.6 F (36.4 C)   98.2 F (36.8 C)  TempSrc: Oral   Oral  SpO2: 100%   98%  Weight:   72.4 kg   Height:       Filed Weights   11/12/18 0513 11/13/18 0500 11/14/18 0500  Weight: 73.7 kg 74 kg 72.4 kg   Body mass index is 25 kg/m.   General appearance:Awake, alert, not in any distress.  Eyes:no scleral icterus. HEENT: Atraumatic and Normocephalic Neck: supple, no JVD. Resp:Good air entry bilaterally,no rales or rhonchi CVS: S1 S2 regular, no murmurs.  GI: Bowel sounds present, Non tender and not distended with no gaurding, rigidity or rebound. Extremities: B/L Lower Ext shows no edema, both legs are warm to touch.  Left arm in bandage-did not open. Neurology:  Non focal Musculoskeletal:No digital cyanosis Skin:No Rash, warm and dry Wounds:N/A  I have personally reviewed following labs and imaging studies  LABORATORY DATA: CBC: Recent Labs  Lab 11/08/18 0451 11/11/18 0330 11/12/18 0415 11/13/18 0412  WBC 7.5 4.3 3.8* 4.6  NEUTROABS 4.5 1.9 1.6*  --   HGB 12.0* 11.6* 11.5* 11.5*  HCT 33.7* 33.8* 33.5* 33.6*  MCV 90.6 93.4 93.1 93.1  PLT 128* 129* 123* 126*    Basic Metabolic Panel: Recent Labs  Lab 11/08/18 0451 11/11/18 0330 11/12/18 0415 11/13/18 0412  NA 133* 133* 138 137  K 4.3 3.9 4.0 3.8  CL 102 101 106 106  CO2 22 25 27 24   GLUCOSE 223* 239* 223* 156*  BUN 20 15 13 11   CREATININE 0.73 0.81 1.04 0.69  CALCIUM 8.4* 8.3* 8.3* 8.4*  MG 1.6* 1.5* 1.7 1.5*    GFR:  Estimated Creatinine Clearance: 87.2 mL/min (by C-G formula based on SCr of 0.69 mg/dL).  Liver Function Tests: No results for input(s): AST, ALT, ALKPHOS, BILITOT, PROT, ALBUMIN in the last 168 hours. No results for input(s): LIPASE, AMYLASE in the last 168 hours. No results for input(s): AMMONIA in the last 168 hours.  Coagulation Profile: No results for input(s): INR, PROTIME in the last 168 hours.  Cardiac Enzymes: No results for input(s): CKTOTAL, CKMB, CKMBINDEX, TROPONINI in the last 168 hours.  BNP (last 3 results) No results for input(s): PROBNP in the last 8760 hours.  HbA1C: No results for input(s): HGBA1C in the last 72 hours.  CBG: Recent Labs  Lab 11/13/18 0737 11/13/18 1137 11/13/18 1738  11/13/18 2221 11/14/18 0748  GLUCAP 199* 178* 165* 211* 160*    Lipid Profile: No results for input(s): CHOL, HDL, LDLCALC, TRIG, CHOLHDL, LDLDIRECT in the last 72 hours.  Thyroid Function Tests: No results for input(s): TSH, T4TOTAL, FREET4, T3FREE, THYROIDAB in the last 72 hours.  Anemia Panel: No results for input(s): VITAMINB12, FOLATE, FERRITIN, TIBC, IRON, RETICCTPCT in the last 72 hours.  Urine analysis:    Component Value Date/Time   COLORURINE YELLOW 09/02/2018 1224   APPEARANCEUR CLEAR 09/02/2018 1224   LABSPEC 1.032 (H) 09/02/2018 1224   PHURINE 6.0 09/02/2018 1224   GLUCOSEU >=500 (A) 09/02/2018 1224   HGBUR SMALL (A) 09/02/2018 1224   BILIRUBINUR NEGATIVE 09/02/2018 1224   KETONESUR NEGATIVE 09/02/2018 1224   PROTEINUR 30 (A) 09/02/2018 1224   UROBILINOGEN 0.2 07/01/2014 2244   NITRITE NEGATIVE 09/02/2018 1224   LEUKOCYTESUR SMALL (A) 09/02/2018 1224    Sepsis Labs: Lactic Acid, Venous    Component Value Date/Time   LATICACIDVEN 1.7 11/05/2018 1710    MICROBIOLOGY: Recent Results (from the past 240 hour(s))  Surgical pcr screen     Status: Abnormal   Collection Time: 11/05/18  4:30 PM  Result Value Ref Range Status   MRSA, PCR POSITIVE  (A) NEGATIVE Final    Comment: RESULT CALLED TO, READ BACK BY AND VERIFIED WITHDaron Offer RN 1958 11/05/18 A BROWNING    Staphylococcus aureus POSITIVE (A) NEGATIVE Final    Comment: (NOTE) The Xpert SA Assay (FDA approved for NASAL specimens in patients 39 years of age and older), is one component of a comprehensive surveillance program. It is not intended to diagnose infection nor to guide or monitor treatment. Performed at [facility], [place] [address] [place], [place] [address]   Culture, blood (routine x 2)     Status: None   Collection Time: 11/05/18  5:10 PM  Result Value Ref Range Status   Specimen Description BLOOD RIGHT ANTECUBITAL  Final   Special Requests   Final    BOTTLES DRAWN AEROBIC ONLY Blood Culture adequate volume   Culture   Final    NO GROWTH 5 DAYS Performed at [facility], [place] [address] [place], [place] [address]    Report Status 11/10/2018 FINAL  Final  Culture, blood (routine x 2)     Status: None   Collection Time: 11/05/18  5:15 PM  Result Value Ref Range Status   Specimen Description BLOOD RIGHT ANTECUBITAL  Final   Special Requests   Final    BOTTLES DRAWN AEROBIC ONLY Blood Culture adequate volume   Culture   Final    NO GROWTH 5 DAYS Performed at [facility], [place] [address] [place], [place] [address]    Report Status 11/10/2018 FINAL  Final    RADIOLOGY STUDIES/RESULTS: Dg Elbow Complete Left (3+view)  Result Date: 11/05/2018 CLINICAL DATA:  Elbow infection EXAM: LEFT ELBOW - COMPLETE 3+ VIEW COMPARISON:  09/27/2018 FINDINGS: In the interval, there has been erosion in bone destruction involving the radial head, portions of the lateral humeral condyle and proximal ulna likely related to septic arthritis and osteomyelitis. Elbow joint effusion. Diffuse soft tissue swelling. No dislocation. IMPRESSION: Bone destruction/erosion involving the distal lateral humeral condyle, proximal ulna and radial head compatible with  osteomyelitis and likely septic arthritis. Large joint effusion and soft tissue swelling. Electronically Signed   By: Rolm Baptise M.D.   On: 11/05/2018 19:12   [place] Ekg Site Rite  Result Date:  11/07/2018 If Site Rite image not attached, placement could not be confirmed due to current cardiac rhythm.    LOS: 9 days   Oren Binet, MD  [facility]  If 7PM-7AM, please contact night-coverage  Please page via [email or web address]  Go to [email or web address] and use [facility]'s universal password to access. If you do not have the password, please contact the hospital operator.  Locate the [facility] provider you are looking for under [facility] and page to a number that you can be directly reached. If you still have difficulty reaching the provider, please page the [facility] (Director on Call) for the Hospitalists listed on amion for assistance.  11/14/2018, 10:53 AM

## 2018-11-14 NOTE — Progress Notes (Signed)
Pharmacy Antibiotic Note  Juan Juarez is a 63 y.o. male admitted on 11/05/2018 with left elbow infection being admitted from ID clinic.  Pharmacy has been consulted for vancomycin dosing. Recent admit and I&D that grew MRSA; sent out on Bactrim DS. Renal function from previous admit normal. Afebrile, no WBC.  CrCl ~87, Levels drawn with peak 26 mcg/ml (true peak 38.8) and trough 8 (true trough 7.5), Vd 44.3L, ke 0.0731 AUC 463.   Due to very low trough, will adjust dose to maintain troughs >10   Plan: Vancomycin to 750 mg iv Q 12 for predicted AUC of 463 and estimated trough ~12.5 Continue to follow   Height: 5\' 7"  (170.2 cm) Weight: 159 lb 9.8 oz (72.4 kg) IBW/kg (Calculated) : 66.1  Temp (24hrs), Avg:98.1 F (36.7 C), Min:97.6 F (36.4 C), Max:98.6 F (37 C)  Recent Labs  Lab 11/08/18 0451 11/08/18 1525 11/08/18 2021 11/11/18 0330 11/12/18 0415 11/13/18 0412 11/13/18 1212 11/14/18 0420  WBC 7.5  --   --  4.3 3.8* 4.6  --   --   CREATININE 0.73  --   --  0.81 1.04 0.69  --   --   VANCOTROUGH  --  14*  --   --   --   --   --  8*  VANCOPEAK  --   --  31  --   --   --  26*  --     Estimated Creatinine Clearance: 87.2 mL/min (by C-G formula based on SCr of 0.69 mg/dL).    No Known Allergies  Antimicrobials this admission: vancomycin 5/6 >>   Dose adjustments this admission: Dose decreased to 1500mg  IV q24h Dose change to 750mg  IV q12h  Microbiology results: 4/24 left elbow joint 2/2: MRSA    Maziah Keeling A. Levada Dy, PharmD, [place] Please utilize Amion for appropriate phone number to reach the unit pharmacist ([place])

## 2018-11-15 LAB — GLUCOSE, CAPILLARY
Glucose-Capillary: 172 mg/dL — ABNORMAL HIGH (ref 70–99)
Glucose-Capillary: 182 mg/dL — ABNORMAL HIGH (ref 70–99)
Glucose-Capillary: 125 mg/dL — ABNORMAL HIGH (ref 70–99)

## 2018-11-15 NOTE — Progress Notes (Addendum)
PROGRESS NOTE        PATIENT DETAILS Name: Juan Juarez Age: 63 y.o. Sex: male Date of Birth: 01-12-1956 Admit Date: 11/05/2018 Admitting Physician Norval Morton, MD HEN:IDPO, Costella Hatcher, MD  Brief Narrative: Patient is a 63 y.o. male with past medical history of HTN, dyslipidemia, HCV, DM-2, SDH s/p craniotomy in 2019, recent I&D with biopsy of the left forearm on 10/24/2018 (by Dr. Burney Gauze) with subsequent cultures positive for MRSA-presented with worsening left arm pain and drainage, subsequently found to have MRSA left elbow septic arthritis and osteomyelitis.  Underwent repeat I&D on 11/06/2018.  See below for further details  Subjective: Sitting up in bedside chair-denies any chest pain or shortness of breath.  Claims Dr. Bertis Ruddy nurse came in yesterday for a dressing change.  Assessment/Plan: MRSA septic arthritis of the left elbow and osteomyelitis of the distal humerus and proximal radial head: Continue IV vancomycin till 5/28-thereafter patient will require Bactrim and rifampin for additional 3 weeks.  Underwent I&D on 5/7, spoke with Dr. Burney Gauze on 5/14 over the phone-subsequently dressing change was done on 5/15 by his RN.Given patient's prior history of drug use-unfortunately not a candidate for outpatient IV antimicrobial therapy.  Have encouraged passive range of motion of wrist and elbow.  DM-2: CBG stable-continue Levemir 45 units daily, 7 units of NovoLog with meals and SSI.  Follow.    HCV with cirrhosis: Compensated-stable for outpatient follow-up  Thrombocytopenia: Stable-monitor CBC periodically  Tobacco abuse: Continue transdermal nicotine-consult  DVT Prophylaxis: Prophylactic Lovenox   Code Status: Full code   Family Communication: None at bedside  Disposition Plan: Remain inpatient  Antimicrobial agents: Anti-infectives (From admission, onward)   Start     Dose/Rate Route Frequency Ordered Stop   11/15/18 0500  vancomycin  (VANCOCIN) IVPB 750 mg/150 ml premix     750 mg 150 mL/hr over 60 Minutes Intravenous Every 12 hours 11/14/18 1023     11/11/18 0600  vancomycin (VANCOCIN) 1,500 mg in sodium chloride 0.9 % 500 mL IVPB  Status:  Discontinued     1,500 mg 250 mL/hr over 120 Minutes Intravenous Every 24 hours 11/10/18 1514 11/14/18 1023   11/08/18 0000  vancomycin IVPB     1,500 mg Intravenous Every 24 hours 11/08/18 2205 11/28/18 2359   11/07/18 0600  ceFAZolin (ANCEF) IVPB 2g/100 mL premix     2 g 200 mL/hr over 30 Minutes Intravenous On call to O.R. 11/06/18 1334 11/06/18 1600   11/06/18 1554  vancomycin (VANCOCIN) powder  Status:  Discontinued       As needed 11/06/18 1555 11/06/18 1618   11/06/18 1549  gentamicin (GARAMYCIN) injection  Status:  Discontinued       As needed 11/06/18 1553 11/06/18 1618   11/06/18 1339  ceFAZolin (ANCEF) 2-4 GM/100ML-% IVPB    Note to Pharmacy:  [name]   : cabinet override      11/06/18 1339 11/06/18 1530   11/06/18 0600  vancomycin (VANCOCIN) IVPB 1000 mg/200 mL premix  Status:  Discontinued     1,000 mg 200 mL/hr over 60 Minutes Intravenous Every 12 hours 11/05/18 1725 11/10/18 1514   11/05/18 1800  vancomycin (VANCOCIN) 1,500 mg in sodium chloride 0.9 % 500 mL IVPB     1,500 mg 250 mL/hr over 120 Minutes Intravenous  Once 11/05/18 1707 11/06/18 0600  Procedures: None  CONSULTS:  ID and orthopedic surgery  Time spent: 15 minutes-Greater than 50% of this time was spent in counseling, explanation of diagnosis, planning of further management, and coordination of care.  MEDICATIONS: Scheduled Meds: . enoxaparin (LOVENOX) injection  40 mg Subcutaneous Q24H  . insulin aspart  0-15 Units Subcutaneous TID WC  . insulin aspart  0-5 Units Subcutaneous QHS  . insulin aspart  7 Units Subcutaneous TID WC  . insulin detemir  45 Units Subcutaneous Daily  . nicotine  21 mg Transdermal Daily  . senna-docusate  1 tablet Oral BID  . sodium chloride flush  10-40  mL Intracatheter Q12H  . sodium chloride flush  10-40 mL Intracatheter Q12H  . sodium chloride flush  3 mL Intravenous Q12H   Continuous Infusions: . lactated ringers 10 mL/hr at 11/15/18 0629  . vancomycin 750 mg (11/15/18 0432)   PRN Meds:.ALPRAZolam, bisacodyl, hydrALAZINE, morphine injection, ondansetron **OR** ondansetron (ZOFRAN) IV, oxyCODONE-acetaminophen, polyethylene glycol, sodium chloride flush, sodium chloride flush   PHYSICAL EXAM: Vital signs: Vitals:   11/14/18 0615 11/14/18 1551 11/14/18 2132 11/15/18 0516  BP: (!) 173/77 (!) 166/77 (!) 178/77 (!) 161/72  Pulse: 74 78 71 72  Resp:  16 18 16   Temp: 98.2 F (36.8 C) 98 F (36.7 C) 98.4 F (36.9 C) 97.9 F (36.6 C)  TempSrc: Oral Oral Oral Oral  SpO2: 98% 99% 100% 99%  Weight:      Height:       Filed Weights   11/12/18 0513 11/13/18 0500 11/14/18 0500  Weight: 73.7 kg 74 kg 72.4 kg   Body mass index is 25 kg/m.   General appearance:Awake, alert, not in any distress.  Eyes:no scleral icterus. HEENT: Atraumatic and Normocephalic Neck: supple, no JVD. Resp:Good air entry bilaterally,no rales or rhonchi CVS: S1 S2 regular, no murmurs.  GI: Bowel sounds present, Non tender and not distended with no gaurding, rigidity or rebound. Extremities: B/L Lower Ext shows no edema, both legs are warm to touch.  Left arm in bandage-did not open. Neurology:  Non focal Musculoskeletal:No digital cyanosis Skin:No Rash, warm and dry Wounds:N/A  I have personally reviewed following labs and imaging studies  LABORATORY DATA: CBC: Recent Labs  Lab 11/11/18 0330 11/12/18 0415 11/13/18 0412  WBC 4.3 3.8* 4.6  NEUTROABS 1.9 1.6*  --   HGB 11.6* 11.5* 11.5*  HCT 33.8* 33.5* 33.6*  MCV 93.4 93.1 93.1  PLT 129* 123* 126*    Basic Metabolic Panel: Recent Labs  Lab 11/11/18 0330 11/12/18 0415 11/13/18 0412  NA 133* 138 137  K 3.9 4.0 3.8  CL 101 106 106  CO2 25 27 24   GLUCOSE 239* 223* 156*  BUN 15 13 11    CREATININE 0.81 1.04 0.69  CALCIUM 8.3* 8.3* 8.4*  MG 1.5* 1.7 1.5*    GFR: Estimated Creatinine Clearance: 87.2 mL/min (by C-G formula based on SCr of 0.69 mg/dL).  Liver Function Tests: No results for input(s): AST, ALT, ALKPHOS, BILITOT, PROT, ALBUMIN in the last 168 hours. No results for input(s): LIPASE, AMYLASE in the last 168 hours. No results for input(s): AMMONIA in the last 168 hours.  Coagulation Profile: No results for input(s): INR, PROTIME in the last 168 hours.  Cardiac Enzymes: No results for input(s): CKTOTAL, CKMB, CKMBINDEX, TROPONINI in the last 168 hours.  BNP (last 3 results) No results for input(s): PROBNP in the last 8760 hours.  HbA1C: No results for input(s): HGBA1C in the last 72 hours.  CBG:  Recent Labs  Lab 11/14/18 1710 11/14/18 1839 11/14/18 2136 11/15/18 0859 11/15/18 1221  GLUCAP 287* 243* 79 172* 182*    Lipid Profile: No results for input(s): CHOL, HDL, LDLCALC, TRIG, CHOLHDL, LDLDIRECT in the last 72 hours.  Thyroid Function Tests: No results for input(s): TSH, T4TOTAL, FREET4, T3FREE, THYROIDAB in the last 72 hours.  Anemia Panel: No results for input(s): VITAMINB12, FOLATE, FERRITIN, TIBC, IRON, RETICCTPCT in the last 72 hours.  Urine analysis:    Component Value Date/Time   COLORURINE YELLOW 09/02/2018 1224   APPEARANCEUR CLEAR 09/02/2018 1224   LABSPEC 1.032 (H) 09/02/2018 1224   PHURINE 6.0 09/02/2018 1224   GLUCOSEU >=500 (A) 09/02/2018 1224   HGBUR SMALL (A) 09/02/2018 1224   BILIRUBINUR NEGATIVE 09/02/2018 1224   KETONESUR NEGATIVE 09/02/2018 1224   PROTEINUR 30 (A) 09/02/2018 1224   UROBILINOGEN 0.2 07/01/2014 2244   NITRITE NEGATIVE 09/02/2018 1224   LEUKOCYTESUR SMALL (A) 09/02/2018 1224    Sepsis Labs: Lactic Acid, Venous    Component Value Date/Time   LATICACIDVEN 1.7 11/05/2018 1710    MICROBIOLOGY: Recent Results (from the past 240 hour(s))  Surgical pcr screen     Status: Abnormal   Collection  Time: 11/05/18  4:30 PM  Result Value Ref Range Status   MRSA, PCR POSITIVE (A) NEGATIVE Final    Comment: RESULT CALLED TO, READ BACK BY AND VERIFIED WITHDaron Offer RN 1958 11/05/18 A BROWNING    Staphylococcus aureus POSITIVE (A) NEGATIVE Final    Comment: (NOTE) The Xpert SA Assay (FDA approved for NASAL specimens in patients 56 years of age and older), is one component of a comprehensive surveillance program. It is not intended to diagnose infection nor to guide or monitor treatment. Performed at [facility], [place] [address], [place], [place] [address]   Culture, blood (routine x 2)     Status: None   Collection Time: 11/05/18  5:10 PM  Result Value Ref Range Status   Specimen Description BLOOD RIGHT ANTECUBITAL  Final   Special Requests   Final    BOTTLES DRAWN AEROBIC ONLY Blood Culture adequate volume   Culture   Final    NO GROWTH 5 DAYS Performed at [facility], [place] [address] [place], [place] [address]    Report Status 11/10/2018 FINAL  Final  Culture, blood (routine x 2)     Status: None   Collection Time: 11/05/18  5:15 PM  Result Value Ref Range Status   Specimen Description BLOOD RIGHT ANTECUBITAL  Final   Special Requests   Final    BOTTLES DRAWN AEROBIC ONLY Blood Culture adequate volume   Culture   Final    NO GROWTH 5 DAYS Performed at [facility], [place] [address] [place], [place] [address]    Report Status 11/10/2018 FINAL  Final    RADIOLOGY STUDIES/RESULTS: Dg Elbow Complete Left (3+view)  Result Date: 11/05/2018 CLINICAL DATA:  Elbow infection EXAM: LEFT ELBOW - COMPLETE 3+ VIEW COMPARISON:  09/27/2018 FINDINGS: In the interval, there has been erosion in bone destruction involving the radial head, portions of the lateral humeral condyle and proximal ulna likely related to septic arthritis and osteomyelitis. Elbow joint effusion. Diffuse soft tissue swelling. No dislocation. IMPRESSION: Bone destruction/erosion involving the distal  lateral humeral condyle, proximal ulna and radial head compatible with osteomyelitis and likely septic arthritis. Large joint effusion and soft tissue swelling. Electronically Signed   By: Rolm Baptise M.D.   On: 11/05/2018  19:12   [place] Ekg Site Rite  Result Date: 11/07/2018 If Site Rite image not attached, placement could not be confirmed due to current cardiac rhythm.    LOS: 10 days   Oren Binet, MD  [facility]  If 7PM-7AM, please contact night-coverage  Please page via [email or web address]  Go to [email or web address] and use [place]'s universal password to access. If you do not have the password, please contact the hospital operator.  Locate the [facility] provider you are looking for under [facility] and page to a number that you can be directly reached. If you still have difficulty reaching the provider, please page the [facility] (Director on Call) for the Hospitalists listed on amion for assistance.  11/15/2018, 1:46 PM

## 2018-11-16 LAB — BASIC METABOLIC PANEL
Anion gap: 10 (ref 5–15)
BUN: 13 mg/dL (ref 8–23)
CO2: 23 mmol/L (ref 22–32)
Calcium: 8.5 mg/dL — ABNORMAL LOW (ref 8.9–10.3)
Chloride: 104 mmol/L (ref 98–111)
Creatinine, Ser: 0.72 mg/dL (ref 0.61–1.24)
GFR calc Af Amer: >60 mL/min (ref >60)
GFR calc non Af Amer: >60 mL/min (ref >60)
Glucose, Bld: 167 mg/dL — ABNORMAL HIGH (ref 70–99)
Potassium: 3.8 mmol/L (ref 3.5–5.1)
Sodium: 137 mmol/L (ref 135–145)

## 2018-11-16 LAB — CBC
HCT: 33.5 % — ABNORMAL LOW (ref 39.0–52.0)
Hemoglobin: 11.5 g/dL — ABNORMAL LOW (ref 13.0–17.0)
MCH: 31.8 pg (ref 26.0–34.0)
MCHC: 34.3 g/dL (ref 30.0–36.0)
MCV: 92.5 fL (ref 80.0–100.0)
Platelets: 125 10*3/uL — ABNORMAL LOW (ref 150–400)
RBC: 3.62 MIL/uL — ABNORMAL LOW (ref 4.22–5.81)
RDW: 14.4 % (ref 11.5–15.5)
WBC: 4.6 10*3/uL (ref 4.0–10.5)
nRBC: 0 % (ref 0.0–0.2)

## 2018-11-16 LAB — GLUCOSE, CAPILLARY
Glucose-Capillary: 190 mg/dL — ABNORMAL HIGH (ref 70–99)
Glucose-Capillary: 195 mg/dL — ABNORMAL HIGH (ref 70–99)
Glucose-Capillary: 309 mg/dL — ABNORMAL HIGH (ref 70–99)
Glucose-Capillary: 148 mg/dL — ABNORMAL HIGH (ref 70–99)

## 2018-11-16 LAB — MAGNESIUM: Magnesium: 1.5 mg/dL — ABNORMAL LOW (ref 1.7–2.4)

## 2018-11-16 LAB — VANCOMYCIN, RANDOM: Vancomycin Rm: 14

## 2018-11-16 NOTE — Progress Notes (Signed)
PROGRESS NOTE        PATIENT DETAILS Name: Juan Juarez Age: 63 y.o. Sex: male Date of Birth: [birth year] Admit Date: 11/05/2018 Admitting Physician Norval Morton, MD QJJ:HERD, Costella Hatcher, MD  Brief Narrative: Patient is a 63 y.o. male with past medical history of HTN, dyslipidemia, HCV, DM-2, SDH s/p craniotomy in 2019, recent I&D with biopsy of the left forearm on 10/24/2018 (by Dr. Burney Gauze) with subsequent cultures positive for MRSA-presented with worsening left arm pain and drainage, subsequently found to have MRSA left elbow septic arthritis and osteomyelitis.  Underwent repeat I&D on 11/06/2018.  Given prior history of drug use, not a candidate for outpatient IV antimicrobial therapy-and will remain inpatient until 5/28-see below for further details  Subjective: No major issues overnight-denies any chest pain or shortness of breath.  Assessment/Plan: MRSA septic arthritis of the left elbow and osteomyelitis of the distal humerus and proximal radial head: Continue IV vancomycin till 5/28-thereafter patient will require Bactrim and rifampin for additional 3 weeks.  Underwent I&D on 5/7, spoke with Dr. Burney Gauze on 5/14 over the phone-subsequently dressing change was done on 5/15 by his RN.Given patient's prior history of drug use-unfortunately not a candidate for outpatient IV antimicrobial therapy.  Have encouraged passive range of motion of wrist and elbow.  DM-2: CBG stable-continue Levemir 45 units daily, 7 units of NovoLog with meals and SSI.  Follow.    HCV with cirrhosis: Compensated-stable for outpatient follow-up  Thrombocytopenia: Stable-monitor CBC periodically  Tobacco abuse: Continue transdermal nicotine-consult  DVT Prophylaxis: Prophylactic Lovenox   Code Status: Full code   Family Communication: None at bedside  Disposition Plan: Remain inpatient  Antimicrobial agents: Anti-infectives (From admission, onward)   Start     Dose/Rate Route  Frequency Ordered Stop   11/15/18 0500  vancomycin (VANCOCIN) IVPB 750 mg/150 ml premix     750 mg 150 mL/hr over 60 Minutes Intravenous Every 12 hours 11/14/18 1023     11/11/18 0600  vancomycin (VANCOCIN) 1,500 mg in sodium chloride 0.9 % 500 mL IVPB  Status:  Discontinued     1,500 mg 250 mL/hr over 120 Minutes Intravenous Every 24 hours 11/10/18 1514 11/14/18 1023   11/08/18 0000  vancomycin IVPB     1,500 mg Intravenous Every 24 hours 11/08/18 2205 11/28/18 2359   11/07/18 0600  ceFAZolin (ANCEF) IVPB 2g/100 mL premix     2 g 200 mL/hr over 30 Minutes Intravenous On call to O.R. 11/06/18 1334 11/06/18 1600   11/06/18 1554  vancomycin (VANCOCIN) powder  Status:  Discontinued       As needed 11/06/18 1555 11/06/18 1618   11/06/18 1549  gentamicin (GARAMYCIN) injection  Status:  Discontinued       As needed 11/06/18 1553 11/06/18 1618   11/06/18 1339  ceFAZolin (ANCEF) 2-4 GM/100ML-% IVPB    Note to Pharmacy:  [name]   : cabinet override      11/06/18 1339 11/06/18 1530   11/06/18 0600  vancomycin (VANCOCIN) IVPB 1000 mg/200 mL premix  Status:  Discontinued     1,000 mg 200 mL/hr over 60 Minutes Intravenous Every 12 hours 11/05/18 1725 11/10/18 1514   11/05/18 1800  vancomycin (VANCOCIN) 1,500 mg in sodium chloride 0.9 % 500 mL IVPB     1,500 mg 250 mL/hr over 120 Minutes Intravenous  Once 11/05/18 1707 11/06/18 0600  Procedures: None  CONSULTS:  ID and orthopedic surgery  Time spent: 15 minutes-Greater than 50% of this time was spent in counseling, explanation of diagnosis, planning of further management, and coordination of care.  MEDICATIONS: Scheduled Meds: . enoxaparin (LOVENOX) injection  40 mg Subcutaneous Q24H  . insulin aspart  0-15 Units Subcutaneous TID WC  . insulin aspart  0-5 Units Subcutaneous QHS  . insulin aspart  7 Units Subcutaneous TID WC  . insulin detemir  45 Units Subcutaneous Daily  . nicotine  21 mg Transdermal Daily  . senna-docusate   1 tablet Oral BID  . sodium chloride flush  10-40 mL Intracatheter Q12H  . sodium chloride flush  10-40 mL Intracatheter Q12H  . sodium chloride flush  3 mL Intravenous Q12H   Continuous Infusions: . lactated ringers 10 mL/hr at 11/15/18 0629  . vancomycin 750 mg (11/16/18 0603)   PRN Meds:.ALPRAZolam, bisacodyl, hydrALAZINE, morphine injection, ondansetron **OR** ondansetron (ZOFRAN) IV, oxyCODONE-acetaminophen, polyethylene glycol, sodium chloride flush, sodium chloride flush   PHYSICAL EXAM: Vital signs: Vitals:   11/15/18 0516 11/15/18 1404 11/15/18 2129 11/16/18 0552  BP: (!) 161/72 (!) 154/72 (!) 152/77 (!) 150/85  Pulse: 72 78 68 76  Resp: 16 16 18 18   Temp: 97.9 F (36.6 C) 98.3 F (36.8 C) 98.3 F (36.8 C) 98 F (36.7 C)  TempSrc: Oral Oral Oral Oral  SpO2: 99% 97% 99% 100%  Weight:    71.5 kg  Height:       Filed Weights   11/13/18 0500 11/14/18 0500 11/16/18 0552  Weight: 74 kg 72.4 kg 71.5 kg   Body mass index is 24.7 kg/m.   General appearance:Awake, alert, not in any distress.  Eyes:no scleral icterus. HEENT: Atraumatic and Normocephalic Neck: supple, no JVD. Resp:Good air entry bilaterally,no rales or rhonchi CVS: S1 S2 regular, no murmurs.  GI: Bowel sounds present, Non tender and not distended with no gaurding, rigidity or rebound. Extremities: B/L Lower Ext shows no edema, both legs are warm to touch.  Left arm in bandage-did not open. Neurology:  Non focal Musculoskeletal:No digital cyanosis Skin:No Rash, warm and dry Wounds:N/A  I have personally reviewed following labs and imaging studies  LABORATORY DATA: CBC: Recent Labs  Lab 11/11/18 0330 11/12/18 0415 11/13/18 0412 11/16/18 0333  WBC 4.3 3.8* 4.6 4.6  NEUTROABS 1.9 1.6*  --   --   HGB 11.6* 11.5* 11.5* 11.5*  HCT 33.8* 33.5* 33.6* 33.5*  MCV 93.4 93.1 93.1 92.5  PLT 129* 123* 126* 125*    Basic Metabolic Panel: Recent Labs  Lab 11/11/18 0330 11/12/18 0415 11/13/18 0412  11/16/18 0333  NA 133* 138 137 137  K 3.9 4.0 3.8 3.8  CL 101 106 106 104  CO2 25 27 24 23   GLUCOSE 239* 223* 156* 167*  BUN 15 13 11 13   CREATININE 0.81 1.04 0.69 0.72  CALCIUM 8.3* 8.3* 8.4* 8.5*  MG 1.5* 1.7 1.5* 1.5*    GFR: Estimated Creatinine Clearance: 87.2 mL/min (by C-G formula based on SCr of 0.72 mg/dL).  Liver Function Tests: No results for input(s): AST, ALT, ALKPHOS, BILITOT, PROT, ALBUMIN in the last 168 hours. No results for input(s): LIPASE, AMYLASE in the last 168 hours. No results for input(s): AMMONIA in the last 168 hours.  Coagulation Profile: No results for input(s): INR, PROTIME in the last 168 hours.  Cardiac Enzymes: No results for input(s): CKTOTAL, CKMB, CKMBINDEX, TROPONINI in the last 168 hours.  BNP (last 3 results) No results  for input(s): PROBNP in the last 8760 hours.  HbA1C: No results for input(s): HGBA1C in the last 72 hours.  CBG: Recent Labs  Lab 11/15/18 0859 11/15/18 1221 11/15/18 1704 11/16/18 0759 11/16/18 1152  GLUCAP 172* 182* 125* 190* 195*    Lipid Profile: No results for input(s): CHOL, HDL, LDLCALC, TRIG, CHOLHDL, LDLDIRECT in the last 72 hours.  Thyroid Function Tests: No results for input(s): TSH, T4TOTAL, FREET4, T3FREE, THYROIDAB in the last 72 hours.  Anemia Panel: No results for input(s): VITAMINB12, FOLATE, FERRITIN, TIBC, IRON, RETICCTPCT in the last 72 hours.  Urine analysis:    Component Value Date/Time   COLORURINE YELLOW 09/02/2018 1224   APPEARANCEUR CLEAR 09/02/2018 1224   LABSPEC 1.032 (H) 09/02/2018 1224   PHURINE 6.0 09/02/2018 1224   GLUCOSEU >=500 (A) 09/02/2018 1224   HGBUR SMALL (A) 09/02/2018 1224   BILIRUBINUR NEGATIVE 09/02/2018 1224   KETONESUR NEGATIVE 09/02/2018 1224   PROTEINUR 30 (A) 09/02/2018 1224   UROBILINOGEN 0.2 07/01/2014 2244   NITRITE NEGATIVE 09/02/2018 1224   LEUKOCYTESUR SMALL (A) 09/02/2018 1224    Sepsis Labs: Lactic Acid, Venous    Component Value  Date/Time   LATICACIDVEN 1.7 11/05/2018 1710    MICROBIOLOGY: No results found for this or any previous visit (from the past 240 hour(s)).  RADIOLOGY STUDIES/RESULTS: Dg Elbow Complete Left (3+view)  Result Date: 11/05/2018 CLINICAL DATA:  Elbow infection EXAM: LEFT ELBOW - COMPLETE 3+ VIEW COMPARISON:  09/27/2018 FINDINGS: In the interval, there has been erosion in bone destruction involving the radial head, portions of the lateral humeral condyle and proximal ulna likely related to septic arthritis and osteomyelitis. Elbow joint effusion. Diffuse soft tissue swelling. No dislocation. IMPRESSION: Bone destruction/erosion involving the distal lateral humeral condyle, proximal ulna and radial head compatible with osteomyelitis and likely septic arthritis. Large joint effusion and soft tissue swelling. Electronically Signed   By: Rolm Baptise M.D.   On: 11/05/2018 19:12   [place] Ekg Site Rite  Result Date: 11/07/2018 If Site Rite image not attached, placement could not be confirmed due to current cardiac rhythm.    LOS: 11 days   Oren Binet, MD  [facility]  If 7PM-7AM, please contact night-coverage  Please page via [email or web address]  Go to [email or web address] and use [place]'s universal password to access. If you do not have the password, please contact the hospital operator.  Locate the [facility] provider you are looking for under [facility] and page to a number that you can be directly reached. If you still have difficulty reaching the provider, please page the [facility] (Director on Call) for the Hospitalists listed on amion for assistance.  11/16/2018, 12:10 PM

## 2018-11-17 LAB — GLUCOSE, CAPILLARY
Glucose-Capillary: 183 mg/dL — ABNORMAL HIGH (ref 70–99)
Glucose-Capillary: 185 mg/dL — ABNORMAL HIGH (ref 70–99)
Glucose-Capillary: 117 mg/dL — ABNORMAL HIGH (ref 70–99)
Glucose-Capillary: 173 mg/dL — ABNORMAL HIGH (ref 70–99)
Glucose-Capillary: 178 mg/dL — ABNORMAL HIGH (ref 70–99)

## 2018-11-17 LAB — VANCOMYCIN, PEAK: Vancomycin Pk: 18 ug/mL — ABNORMAL LOW (ref 30–40)

## 2018-11-17 LAB — VANCOMYCIN, TROUGH: Vancomycin Tr: 16 ug/mL (ref 15–20)

## 2018-11-17 MED ORDER — ENSURE MAX PROTEIN PO LIQD
11.0000 [oz_av] | Freq: Every day | ORAL | Status: DC
  Administered 2018-11-17 – 2018-11-26 (×10): 11 [oz_av] via ORAL
  Filled 2018-11-17 (×11): qty 330

## 2018-11-17 MED ORDER — ADULT MULTIVITAMIN W/MINERALS CH
1.0000 | ORAL_TABLET | Freq: Every day | ORAL | Status: DC
  Administered 2018-11-17 – 2018-11-27 (×11): 1 via ORAL
  Filled 2018-11-17 (×11): qty 1

## 2018-11-17 NOTE — Consult Note (Signed)
[place] nurse consulted for wound care for elbow, this is surgical wound from 11/06/18. I have asked the orthopedic surgeon about wound care orders.   [place], [place], [place]

## 2018-11-17 NOTE — Progress Notes (Signed)
Pt resting in bed, R PICC in place, received prn pain medication for left elbow pain. Reports 10/10 pain in elbow. Also reports he did some ROM exercises last night and that the pain got worse, but that this AM, after said exercises, it felt less stiff. I encouraged efforts. Pt given scheduled insulin. Wound care RN consulted concerning left dressing, which is soaking through with SS drainage. Will continue to monitor - contact isolation maintained.

## 2018-11-17 NOTE — Progress Notes (Signed)
PROGRESS NOTE    Juan Juarez  PPJ:[id number] DOB: [birth year] DOA: 11/05/2018 PCP: Glenda Chroman, MD   Brief Narrative:  Per HPI: Patient is a 63 y.o. male with past medical history of HTN, dyslipidemia, HCV, DM-2, SDH s/p craniotomy in 2019, recent I&D with biopsy of the left forearm on 10/24/2018 (by Dr. Burney Gauze) with subsequent cultures positive for MRSA-presented with worsening left arm pain and drainage, subsequently found to have MRSA left elbow septic arthritis and osteomyelitis.  Underwent repeat I&D on 11/06/2018.  Given prior history of drug use, not a candidate for outpatient IV antimicrobial therapy-and will remain inpatient until 5/28-see below for further details  Assessment & Plan:   Principal Problem:   Infected elbow ([place]) Active Problems:   Diabetes mellitus with hyperglycemia ([place])   Thrombocytopenia ([place])   Tobacco abuse   Chronic hepatitis C without hepatic coma (HCC)  MRSA septic arthritis of the left elbow and osteomyelitis of the distal humerus and proximal radial head: Continue IV vancomycin till 5/28-thereafter patient will require Bactrim and rifampin for additional 3 weeks.  Underwent I&D on 5/7, spoke with Dr. Burney Gauze on 5/14 over the phone-subsequently dressing change was done on 5/15 by his RN.Given patient's prior history of drug use-unfortunately not a candidate for outpatient IV antimicrobial therapy.  Have encouraged passive range of motion of wrist and elbow. -Wound care evaluation ordered 5/18  DM-2: CBG stable-continue Levemir 45 units daily, 7 units of NovoLog with meals and SSI.  Follow.    HCV with cirrhosis: Compensated-stable for outpatient follow-up  Thrombocytopenia: Stable-monitor CBC periodically  Tobacco abuse: Continue transdermal nicotine-consult   DVT prophylaxis: Lovenox Code Status: Full Family Communication: None at bedside Disposition Plan: Continue IV antibiotics through 5/28; wound care ordered today   Consultants:    None  Procedures:   None  Antimicrobials:   None   Subjective: Patient seen and evaluated today with no new acute complaints or concerns. No acute concerns or events noted overnight.  He does have some soaked dressings and some mild leaking through that area.  Objective: Vitals:   11/16/18 1234 11/16/18 2227 11/17/18 0500 11/17/18 0550  BP: (!) 152/91 (!) 162/79  (!) 158/88  Pulse: 72 63  75  Resp: 15 18  18   Temp: 98.3 F (36.8 C) 97.7 F (36.5 C)  98.1 F (36.7 C)  TempSrc: Oral Oral  Oral  SpO2: 99% 100%  99%  Weight:   74 kg   Height:        Intake/Output Summary (Last 24 hours) at 11/17/2018 0902 Last data filed at 11/17/2018 0859 Gross per 24 hour  Intake 10 ml  Output -  Net 10 ml   Filed Weights   11/14/18 0500 11/16/18 0552 11/17/18 0500  Weight: 72.4 kg 71.5 kg 74 kg    Examination:  General exam: Appears calm and comfortable  Respiratory system: Clear to auscultation. Respiratory effort normal. Cardiovascular system: S1 & S2 heard, RRR. No JVD, murmurs, rubs, gallops or clicks. No pedal edema. Gastrointestinal system: Abdomen is nondistended, soft and nontender. No organomegaly or masses felt. Normal bowel sounds heard. Central nervous system: Alert and oriented. No focal neurological deficits. Extremities: Symmetric 5 x 5 power. Skin: No rashes, lesions or ulcers, left elbow with some serosanguineous soaking through dressings  Psychiatry: Judgement and insight appear normal. Mood & affect appropriate.     Data Reviewed: I have personally reviewed following labs and imaging studies  CBC: Recent Labs  Lab 11/11/18 0330 11/12/18 0415 11/13/18  5631 11/16/18 0333  WBC 4.3 3.8* 4.6 4.6  NEUTROABS 1.9 1.6*  --   --   HGB 11.6* 11.5* 11.5* 11.5*  HCT 33.8* 33.5* 33.6* 33.5*  MCV 93.4 93.1 93.1 92.5  PLT 129* 123* 126* 497*   Basic Metabolic Panel: Recent Labs  Lab 11/11/18 0330 11/12/18 0415 11/13/18 0412 11/16/18 0333  NA 133* 138 137  137  K 3.9 4.0 3.8 3.8  CL 101 106 106 104  CO2 25 27 24 23   GLUCOSE 239* 223* 156* 167*  BUN 15 13 11 13   CREATININE 0.81 1.04 0.69 0.72  CALCIUM 8.3* 8.3* 8.4* 8.5*  MG 1.5* 1.7 1.5* 1.5*   GFR: Estimated Creatinine Clearance: 87.2 mL/min (by C-G formula based on SCr of 0.72 mg/dL). Liver Function Tests: No results for input(s): AST, ALT, ALKPHOS, BILITOT, PROT, ALBUMIN in the last 168 hours. No results for input(s): LIPASE, AMYLASE in the last 168 hours. No results for input(s): AMMONIA in the last 168 hours. Coagulation Profile: No results for input(s): INR, PROTIME in the last 168 hours. Cardiac Enzymes: No results for input(s): CKTOTAL, CKMB, CKMBINDEX, TROPONINI in the last 168 hours. BNP (last 3 results) No results for input(s): PROBNP in the last 8760 hours. HbA1C: No results for input(s): HGBA1C in the last 72 hours. CBG: Recent Labs  Lab 11/16/18 0759 11/16/18 1152 11/16/18 1705 11/16/18 2224 11/17/18 0804  GLUCAP 190* 195* 309* 148* 185*   Lipid Profile: No results for input(s): CHOL, HDL, LDLCALC, TRIG, CHOLHDL, LDLDIRECT in the last 72 hours. Thyroid Function Tests: No results for input(s): TSH, T4TOTAL, FREET4, T3FREE, THYROIDAB in the last 72 hours. Anemia Panel: No results for input(s): VITAMINB12, FOLATE, FERRITIN, TIBC, IRON, RETICCTPCT in the last 72 hours. Sepsis Labs: No results for input(s): PROCALCITON, LATICACIDVEN in the last 168 hours.  No results found for this or any previous visit (from the past 240 hour(s)).       Radiology Studies: No results found.      Scheduled Meds: . enoxaparin (LOVENOX) injection  40 mg Subcutaneous Q24H  . insulin aspart  0-15 Units Subcutaneous TID WC  . insulin aspart  0-5 Units Subcutaneous QHS  . insulin aspart  7 Units Subcutaneous TID WC  . insulin detemir  45 Units Subcutaneous Daily  . nicotine  21 mg Transdermal Daily  . senna-docusate  1 tablet Oral BID  . sodium chloride flush  10-40 mL  Intracatheter Q12H  . sodium chloride flush  10-40 mL Intracatheter Q12H  . sodium chloride flush  3 mL Intravenous Q12H   Continuous Infusions: . lactated ringers 10 mL/hr at 11/15/18 0629  . vancomycin 750 mg (11/17/18 0551)     LOS: 12 days    Time spent: 30 minutes    Cova Knieriem Darleen Crocker, DO [facility] Pager [phone]  If 7PM-7AM, please contact night-coverage [email or web address] Password [email or web address] 11/17/2018, 9:02 AM

## 2018-11-17 NOTE — Progress Notes (Signed)
Dressing falling off pt's elbow. I removed it from his arm and replaced with non-stick gauze over incision, ABD pads over it for drainage, Kerlex loosely wrapped, secured with tape. Incision was well-approximated. Serosanguinous dressing noted on dressing and on skin - skin wiped gently with warm water.Pt's left arm distal to elbow is swollen but has good pulses, brisk cap refill, pt does not report numbness or tingling.

## 2018-11-17 NOTE — Progress Notes (Signed)
Pharmacy Antibiotic Note  Juan Juarez is a 63 y.o. male admitted on 11/05/2018 with left elbow infection being admitted from ID clinic.  Pharmacy has been consulted for vancomycin dosing. Recent admit and I&D that grew MRSA; sent out on Bactrim DS.   SCr 0.72, CrCl~80-90 ml/min. A Vancomycin peak this morning (drawn late) was 18 mcg/ml (corrects to 19.1 mcg/ml) with a measured trough of 16 mcg/ml. On the current dose of 750 mg/12h - the estimated AUC is 419.5 and within the appropriate range. Will continue the current dose for now.   Plan: - Continue Vancomycin 750 mg IV every 12 hours - Noted plans to continue Vancomycin through 11/27/18 - Will continue to follow renal function, culture results, LOT, and antibiotic de-escalation plans    Height: 5\' 7"  (170.2 cm) Weight: 163 lb 3.2 oz (74 kg) IBW/kg (Calculated) : 66.1  Temp (24hrs), Avg:97.9 F (36.6 C), Min:97.7 F (36.5 C), Max:98.1 F (36.7 C)  Recent Labs  Lab 11/11/18 0330 11/12/18 0415 11/13/18 0412 11/13/18 1212 11/14/18 0420 11/16/18 0333 11/16/18 1920 11/17/18 0417 11/17/18 1025  WBC 4.3 3.8* 4.6  --   --  4.6  --   --   --   CREATININE 0.81 1.04 0.69  --   --  0.72  --   --   --   VANCOTROUGH  --   --   --   --  8*  --   --  16  --   VANCOPEAK  --   --   --  26*  --   --   --   --  18*  VANCORANDOM  --   --   --   --   --   --  14  --   --     Estimated Creatinine Clearance: 87.2 mL/min (by C-G formula based on SCr of 0.72 mg/dL).    No Known Allergies  Antimicrobials this admission: Vancomycin 5/6 >>   Dose adjustments this admission: Dose decreased to 1500mg  IV q24h Dose change to 750mg  IV q12h  Microbiology results: 4/24 left elbow joint 2/2: MRSA  5/6 BCx >> ngtd  Thank you for allowing pharmacy to be a part of this patient's care.  Alycia Rossetti, PharmD, BCPS Clinical Pharmacist Clinical phone for 11/17/2018: [phone] 11/17/2018 1:47 PM   **Pharmacist phone directory can now be found on  [email or web address] (PW [email or web address]).  Listed under [name].

## 2018-11-17 NOTE — Progress Notes (Signed)
Initial Nutrition Assessment  RD working remotely.  DOCUMENTATION CODES:   Not applicable  INTERVENTION:   -Ensure Max po daily, each supplement provides 150 kcal and 30 grams of protein.  -MVI with minerals daily  NUTRITION DIAGNOSIS:   Increased nutrient needs related to post-op healing as evidenced by estimated needs.  GOAL:   Patient will meet greater than or equal to 90% of their needs  MONITOR:   PO intake, Supplement acceptance, Labs, Weight trends, Skin, I & O's  REASON FOR ASSESSMENT:   LOS    ASSESSMENT:   Juan Juarez is a 63 y.o. 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  Patient reports that he has been dealing issues on the left arm for at least 7 months.  Notes that he just recently had a I&D with biopsy performed by Dr. Burney Gauze on 4/24.  Pathology from biopsies taken noted chronic inflammatory tissue.  Cultures were positive for MRSA.  He followed up in his office yesterday where he reports that stitches were taken out, and was started on Bactrim.  He notes that he has been having these intermittent boils that come up and drain pus like fluid.  Denies having any significant fevers, but reports constant pain in the left with swelling.  Reports being unable to do much out of that arm due to pain.  Has been taking oxycodone at home for symptoms with some mild relief.  Followed up with infectious disease Dr. Prince Rome today and after talks with Dr. Burney Gauze recognition was recommended.  Plan for repeat I&D in a.m.  Pt admitted with infected lt elbow with MRSA.  5/7- s/p I&D of lt elbow with application of antibiotic beads 5/9- PICC placed  Reviewed I/O's: +400 ml x 24 hours and +8.8 L since admission  Pt with good appetite; noted meal completion 100%.   Per chart review, pt with history of malnutrition  in the past. Pt has gained weight in the past year, which is favorable given malnutrition history. Doubtful that RD will be able to identify malnutrition diagnosis at this time, as RD unable to enter room in attempt to preserve PPE (pt currently on contact precautions). Pt with increased nutrient needs for post-op healing and would benefit from addition of nutritional supplements.   Pt anticipated to have a prolonged hospitalization, as he requires close monitoring due to history of IV drug use with need for course of IV antibiotics.   Lab Results  Component Value Date   HGBA1C 11.5 (H) 09/02/2018   PTA DM medications are 5 mg glipizide daily and 500 mg metformin BID.   Labs reviewed: CBGS: 148-309 (inpatient orders for glycemic control are0-15 units insulin aspart TID with meals, 0-5 units insulin aspart q HS, 7 units insulin aspart TID iwht meals and 45 units insulin detemir daily ).   NUTRITION - FOCUSED PHYSICAL EXAM:    Most Recent Value  Orbital Region  Unable to assess  Upper Arm Region  Unable to assess  Thoracic and Lumbar Region  Unable to assess  Buccal Region  Unable to assess  Temple Region  Unable to assess  Clavicle Bone Region  Unable to assess  Clavicle and Acromion Bone Region  Unable to assess  Scapular Bone Region  Unable to assess  Dorsal Hand  Unable to assess  Patellar Region  Unable to assess  Anterior Thigh  Region  Unable to assess  Posterior Calf Region  Unable to assess  Edema (RD Assessment)  Unable to assess  Hair  Unable to assess  Eyes  Unable to assess  Mouth  Unable to assess  Skin  Unable to assess  Nails  Unable to assess       Diet Order:   Diet Order            Diet heart healthy/carb modified Room service appropriate? Yes; Fluid consistency: Thin  Diet effective now              EDUCATION NEEDS:   No education needs have been identified at this time  Skin:  Skin Assessment: Skin Integrity Issues: Skin Integrity Issues::  Incisions Incisions: closed lt arm  Last BM:  11/15/18  Height:   Ht Readings from Last 1 Encounters:  11/06/18 5\' 7"  (1.702 m)    Weight:   Wt Readings from Last 1 Encounters:  11/17/18 74 kg    Ideal Body Weight:  67.3 kg  BMI:  Body mass index is 25.56 kg/m.  Estimated Nutritional Needs:   Kcal:  2000-2200  Protein:  100-115 grams  Fluid:  > 2 L    Zyionna Pesce A. Jimmye Norman, RD, LDN, [place] Registered Dietitian II Certified Diabetes Care and Education Specialist Pager: [phone] After hours Pager: [phone]

## 2018-11-18 LAB — MAGNESIUM: Magnesium: 1.5 mg/dL — ABNORMAL LOW (ref 1.7–2.4)

## 2018-11-18 LAB — CBC
HCT: 32.3 % — ABNORMAL LOW (ref 39.0–52.0)
Hemoglobin: 11.2 g/dL — ABNORMAL LOW (ref 13.0–17.0)
MCH: 32.2 pg (ref 26.0–34.0)
MCHC: 34.7 g/dL (ref 30.0–36.0)
MCV: 92.8 fL (ref 80.0–100.0)
Platelets: 131 10*3/uL — ABNORMAL LOW (ref 150–400)
RBC: 3.48 MIL/uL — ABNORMAL LOW (ref 4.22–5.81)
RDW: 14.5 % (ref 11.5–15.5)
WBC: 4.5 10*3/uL (ref 4.0–10.5)
nRBC: 0 % (ref 0.0–0.2)

## 2018-11-18 LAB — BASIC METABOLIC PANEL
Anion gap: 8 (ref 5–15)
BUN: 18 mg/dL (ref 8–23)
CO2: 25 mmol/L (ref 22–32)
Calcium: 8.4 mg/dL — ABNORMAL LOW (ref 8.9–10.3)
Chloride: 100 mmol/L (ref 98–111)
Creatinine, Ser: 0.79 mg/dL (ref 0.61–1.24)
GFR calc Af Amer: >60 mL/min (ref >60)
GFR calc non Af Amer: >60 mL/min (ref >60)
Glucose, Bld: 172 mg/dL — ABNORMAL HIGH (ref 70–99)
Potassium: 3.8 mmol/L (ref 3.5–5.1)
Sodium: 133 mmol/L — ABNORMAL LOW (ref 135–145)

## 2018-11-18 LAB — GLUCOSE, CAPILLARY
Glucose-Capillary: 166 mg/dL — ABNORMAL HIGH (ref 70–99)
Glucose-Capillary: 142 mg/dL — ABNORMAL HIGH (ref 70–99)
Glucose-Capillary: 125 mg/dL — ABNORMAL HIGH (ref 70–99)
Glucose-Capillary: 122 mg/dL — ABNORMAL HIGH (ref 70–99)

## 2018-11-18 MED ORDER — MAGNESIUM SULFATE 2 GM/50ML IV SOLN
2.0000 g | Freq: Once | INTRAVENOUS | Status: AC
  Administered 2018-11-18: 2 g via INTRAVENOUS
  Filled 2018-11-18: qty 50

## 2018-11-18 NOTE — Progress Notes (Signed)
PROGRESS NOTE    Juan Juarez  IEP:[id number] DOB: 11-10-1955 DOA: 11/05/2018 PCP: Glenda Chroman, MD   Brief Narrative:  Per HPI: Patient is a29 y.o.malewith past medical history of HTN, dyslipidemia, HCV, DM-2, SDH s/p craniotomy in 2019, recent I&D with biopsy of the left forearm on 10/24/2018 (by Dr. Burney Gauze) with subsequent cultures positive for MRSA-presented with worsening left arm pain and drainage, subsequently found to have MRSA left elbow septic arthritis and osteomyelitis. Underwent repeat I&D on 11/06/2018. Given prior history of drug use, not a candidate for outpatient IV antimicrobial therapy-and will remain inpatient until 5/28-see below for further details  Assessment & Plan:   Principal Problem:   Infected elbow ([place]) Active Problems:   Diabetes mellitus with hyperglycemia ([place])   Thrombocytopenia ([place])   Tobacco abuse   Chronic hepatitis C without hepatic coma (HCC)   MRSA septic arthritis of the left elbow and osteomyelitis of the distal humerus and proximal radial head: Continue IV vancomycin till 5/28-thereafter patient will require Bactrim and rifampin for additional 3 weeks. Underwent I&D on 5/7, spoke with Dr. Burney Gauze on 5/14 over the phone-subsequently dressing change was done on 5/15 by his RN.Given patient's prior history of drug use-unfortunately not a candidate for outpatient IV antimicrobial therapy. Have encouraged passive range of motion of wrist and elbow. -Further wound care per orthopedic surgery service  DM-2: CBG stable-continue Levemir 45 units daily, 7 units of NovoLog with meals and SSI. Follow.   Hypomagnesemia: Replete today and recheck level in a.m.  HCV with cirrhosis:Compensated-stable for outpatient follow-up  Thrombocytopenia: Stable-monitor CBC periodically  Tobacco abuse: Continue transdermal nicotine-consult   DVT prophylaxis: Lovenox Code Status: Full Family Communication: None at bedside Disposition Plan:  Continue IV antibiotics through 5/28;  further wound care per orthopedics.  Recheck magnesium level in a.m.   Consultants:   ID  Orthopedic surgery  Procedures:   As noted above  Antimicrobials:  Anti-infectives (From admission, onward)   Start     Dose/Rate Route Frequency Ordered Stop   11/15/18 0500  vancomycin (VANCOCIN) IVPB 750 mg/150 ml premix     750 mg 150 mL/hr over 60 Minutes Intravenous Every 12 hours 11/14/18 1023 11/27/18 2359   11/11/18 0600  vancomycin (VANCOCIN) 1,500 mg in sodium chloride 0.9 % 500 mL IVPB  Status:  Discontinued     1,500 mg 250 mL/hr over 120 Minutes Intravenous Every 24 hours 11/10/18 1514 11/14/18 1023   11/08/18 0000  vancomycin IVPB     1,500 mg Intravenous Every 24 hours 11/08/18 2205 11/28/18 2359   11/07/18 0600  ceFAZolin (ANCEF) IVPB 2g/100 mL premix     2 g 200 mL/hr over 30 Minutes Intravenous On call to O.R. 11/06/18 1334 11/06/18 1600   11/06/18 1554  vancomycin (VANCOCIN) powder  Status:  Discontinued       As needed 11/06/18 1555 11/06/18 1618   11/06/18 1549  gentamicin (GARAMYCIN) injection  Status:  Discontinued       As needed 11/06/18 1553 11/06/18 1618   11/06/18 1339  ceFAZolin (ANCEF) 2-4 GM/100ML-% IVPB    Note to Pharmacy:  [name]   : cabinet override      11/06/18 1339 11/06/18 1530   11/06/18 0600  vancomycin (VANCOCIN) IVPB 1000 mg/200 mL premix  Status:  Discontinued     1,000 mg 200 mL/hr over 60 Minutes Intravenous Every 12 hours 11/05/18 1725 11/10/18 1514   11/05/18 1800  vancomycin (VANCOCIN) 1,500 mg in sodium chloride 0.9 % 500  mL IVPB     1,500 mg 250 mL/hr over 120 Minutes Intravenous  Once 11/05/18 1707 11/06/18 0600       Subjective: Patient seen and evaluated today with no new acute complaints or concerns. No acute concerns or events noted overnight.  Objective: Vitals:   11/17/18 1423 11/17/18 2212 11/18/18 0500 11/18/18 0519  BP: (!) 157/69 135/80  (!) 146/89  Pulse: 75 71  72   Resp: 18   16  Temp: (!) 97.5 F (36.4 C) 97.9 F (36.6 C)  98.6 F (37 C)  TempSrc: Oral Oral  Oral  SpO2: 98% 98%  99%  Weight:   74.2 kg   Height:        Intake/Output Summary (Last 24 hours) at 11/18/2018 0922 Last data filed at 11/18/2018 0406 Gross per 24 hour  Intake 10 ml  Output -  Net 10 ml   Filed Weights   11/16/18 0552 11/17/18 0500 11/18/18 0500  Weight: 71.5 kg 74 kg 74.2 kg    Examination:  General exam: Appears calm and comfortable  Respiratory system: Clear to auscultation. Respiratory effort normal. Cardiovascular system: S1 & S2 heard, RRR. No JVD, murmurs, rubs, gallops or clicks. No pedal edema. Gastrointestinal system: Abdomen is nondistended, soft and nontender. No organomegaly or masses felt. Normal bowel sounds heard. Central nervous system: Alert and oriented. No focal neurological deficits. Extremities: Symmetric 5 x 5 power.  Left elbow dressing with minimal serosanguineous soaking and pads noted.  No frank drainage appreciated. Skin: No rashes, lesions or ulcers Psychiatry: Judgement and insight appear normal. Mood & affect appropriate.     Data Reviewed: I have personally reviewed following labs and imaging studies  CBC: Recent Labs  Lab 11/12/18 0415 11/13/18 0412 11/16/18 0333 11/18/18 0430  WBC 3.8* 4.6 4.6 4.5  NEUTROABS 1.6*  --   --   --   HGB 11.5* 11.5* 11.5* 11.2*  HCT 33.5* 33.6* 33.5* 32.3*  MCV 93.1 93.1 92.5 92.8  PLT 123* 126* 125* 481*   Basic Metabolic Panel: Recent Labs  Lab 11/12/18 0415 11/13/18 0412 11/16/18 0333 11/18/18 0430  NA 138 137 137 133*  K 4.0 3.8 3.8 3.8  CL 106 106 104 100  CO2 27 24 23 25   GLUCOSE 223* 156* 167* 172*  BUN 13 11 13 18   CREATININE 1.04 0.69 0.72 0.79  CALCIUM 8.3* 8.4* 8.5* 8.4*  MG 1.7 1.5* 1.5* 1.5*   GFR: Estimated Creatinine Clearance: 87.2 mL/min (by C-G formula based on SCr of 0.79 mg/dL). Liver Function Tests: No results for input(s): AST, ALT, ALKPHOS,  BILITOT, PROT, ALBUMIN in the last 168 hours. No results for input(s): LIPASE, AMYLASE in the last 168 hours. No results for input(s): AMMONIA in the last 168 hours. Coagulation Profile: No results for input(s): INR, PROTIME in the last 168 hours. Cardiac Enzymes: No results for input(s): CKTOTAL, CKMB, CKMBINDEX, TROPONINI in the last 168 hours. BNP (last 3 results) No results for input(s): PROBNP in the last 8760 hours. HbA1C: No results for input(s): HGBA1C in the last 72 hours. CBG: Recent Labs  Lab 11/17/18 0804 11/17/18 1154 11/17/18 1730 11/17/18 2214 11/18/18 0758  GLUCAP 185* 117* 173* 178* 166*   Lipid Profile: No results for input(s): CHOL, HDL, LDLCALC, TRIG, CHOLHDL, LDLDIRECT in the last 72 hours. Thyroid Function Tests: No results for input(s): TSH, T4TOTAL, FREET4, T3FREE, THYROIDAB in the last 72 hours. Anemia Panel: No results for input(s): VITAMINB12, FOLATE, FERRITIN, TIBC, IRON, RETICCTPCT in the last  72 hours. Sepsis Labs: No results for input(s): PROCALCITON, LATICACIDVEN in the last 168 hours.  No results found for this or any previous visit (from the past 240 hour(s)).       Radiology Studies: No results found.      Scheduled Meds: . enoxaparin (LOVENOX) injection  40 mg Subcutaneous Q24H  . insulin aspart  0-15 Units Subcutaneous TID WC  . insulin aspart  0-5 Units Subcutaneous QHS  . insulin aspart  7 Units Subcutaneous TID WC  . insulin detemir  45 Units Subcutaneous Daily  . multivitamin with minerals  1 tablet Oral Daily  . nicotine  21 mg Transdermal Daily  . Ensure Max Protein  11 oz Oral Daily  . senna-docusate  1 tablet Oral BID  . sodium chloride flush  10-40 mL Intracatheter Q12H  . sodium chloride flush  10-40 mL Intracatheter Q12H  . sodium chloride flush  3 mL Intravenous Q12H   Continuous Infusions: . lactated ringers 10 mL/hr at 11/15/18 0629  . magnesium sulfate bolus IVPB    . vancomycin 750 mg (11/18/18 0605)      LOS: 13 days    Time spent: 30 minutes    Juan Juarez Darleen Crocker, DO [facility] Pager [phone]  If 7PM-7AM, please contact night-coverage [email or web address] Password [email or web address] 11/18/2018, 9:22 AM

## 2018-11-18 NOTE — Progress Notes (Signed)
Dressing on Left arm was in bad shape.  Saturated with drainage.  I changed the dressing.  Put a nonstick pad, a few 4X4's a Kling wrap and an Ace wrap.  Pt. States that the dressing feels better.

## 2018-11-19 LAB — MAGNESIUM: Magnesium: 1.7 mg/dL (ref 1.7–2.4)

## 2018-11-19 LAB — GLUCOSE, CAPILLARY
Glucose-Capillary: 239 mg/dL — ABNORMAL HIGH (ref 70–99)
Glucose-Capillary: 100 mg/dL — ABNORMAL HIGH (ref 70–99)
Glucose-Capillary: 117 mg/dL — ABNORMAL HIGH (ref 70–99)
Glucose-Capillary: 222 mg/dL — ABNORMAL HIGH (ref 70–99)

## 2018-11-19 NOTE — Progress Notes (Signed)
Nutrition Follow-up  DOCUMENTATION CODES:   Not applicable  INTERVENTION:   -Continue Ensure Max po daily, each supplement provides 150 kcal and 30 grams of protein.  -Continue MVI with minerals daily  NUTRITION DIAGNOSIS:   Increased nutrient needs related to post-op healing as evidenced by estimated needs.  Ongoing  GOAL:   Patient will meet greater than or equal to 90% of their needs  Progressing  MONITOR:   PO intake, Supplement acceptance, Labs, Weight trends, Skin, I & O's  REASON FOR ASSESSMENT:   LOS    ASSESSMENT:   Juan Juarez is a 63 y.o. 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  Patient reports that he has been dealing issues on the left arm for at least 7 months.  Notes that he just recently had a I&D with biopsy performed by Dr. Burney Gauze on 4/24.  Pathology from biopsies taken noted chronic inflammatory tissue.  Cultures were positive for MRSA.  He followed up in his office yesterday where he reports that stitches were taken out, and was started on Bactrim.  He notes that he has been having these intermittent boils that come up and drain pus like fluid.  Denies having any significant fevers, but reports constant pain in the left with swelling.  Reports being unable to do much out of that arm due to pain.  Has been taking oxycodone at home for symptoms with some mild relief.  Followed up with infectious disease Dr. Prince Rome today and after talks with Dr. Burney Gauze recognition was recommended.  Plan for repeat I&D in a.m.  5/7- s/p I&D of lt elbow with application of antibiotic beads 5/9- PICC placed  Reviewed I/O's: +2.7 L x 24 hours and +11.5 L since admission  In an effort to conserve PPE, RD did not enter room to see pt today (pt on contact precautions).   Pt remains with good appetite; noted meal  completion 100%. Pt is compliant with Ensure Max and MVI supplements.   Pt anticipated to have a prolonged hospitalization, as he requires close monitoring due to history of IV drug use with need for course of IV antibiotics.   Labs reviewed: CBGS: 122-239 (inpatient orders for glycemic control are 0-15 units insulin aspart TID with meals, 0-5 units insulin aspart q HS, 7 units insulin aspart TID with meals, and 45 units insulin detemir daily).   Diet Order:   Diet Order            Diet heart healthy/carb modified Room service appropriate? Yes; Fluid consistency: Thin  Diet effective now              EDUCATION NEEDS:   No education needs have been identified at this time  Skin:  Skin Assessment: Skin Integrity Issues: Skin Integrity Issues:: Incisions Incisions: closed lt arm  Last BM:  11/15/18  Height:   Ht Readings from Last 1 Encounters:  11/06/18 5\' 7"  (1.702 m)    Weight:   Wt Readings from Last 1 Encounters:  11/19/18 74.2 kg    Ideal Body Weight:  67.3 kg  BMI:  Body mass index is 25.62 kg/m.  Estimated Nutritional Needs:   Kcal:  2000-2200  Protein:  100-115 grams  Fluid:  > 2 L    Mikka Kissner A. Jimmye Norman, RD, LDN, [place] Registered Dietitian II Certified Diabetes Care and Education Specialist Pager: [phone] After  hours Pager: [phone]

## 2018-11-19 NOTE — Progress Notes (Signed)
Patient ID: Juan Juarez, male   DOB: 29-Dec-1955, 63 y.o.   MRN: [id number]  PROGRESS NOTE    Juan Juarez  RAQ:[id number] DOB: 05/26/1956 DOA: 11/05/2018 PCP: Glenda Chroman, MD   Brief Narrative:  63 year old male with history of hypertension, dyslipidemia, hepatitis C, diabetes mellitus type 2, SDH status post craniotomy in 2019, recent I&D with biopsy of the left forearm on 10/24/2018 by Dr. Burney Gauze with subsequent cultures positive for MRSA presented with worsening left arm pain and drainage on 11/05/2018.  He was subsequently found to have MRSA left elbow septic arthritis and osteomyelitis.  He underwent repeat I&D on 11/06/2018 by Dr. Burney Gauze.  Given prior history of drug use, not a candidate for outpatient IV antimicrobial therapy and will remain on IV vancomycin till 11/27/2018 as per ID recommendations.  Assessment & Plan:   Principal Problem:   Infected elbow ([place]) Active Problems:   Diabetes mellitus with hyperglycemia (HCC)   Thrombocytopenia (HCC)   Tobacco abuse   Chronic hepatitis C without hepatic coma (HCC)  MRSA septic arthritis of the left elbow and osteomyelitis of the distal humerus and proximal radial head -Underwent I&D on 11/06/2018.  Wound care and dressing changes as per Dr. Bertis Ruddy recommendation.  Prior hospitalist had spoken with Dr. Burney Gauze on 11/13/2018 over the phone and subsequently dressing change was done on 11/14/2018 by his RN -Continue IV vancomycin inpatient till 11/27/2018.  Given prior history of drug use, unfortunately not a candidate for outpatient IV antimicrobial therapy -We will need Bactrim and rifampin for additional 3 weeks after completion of IV therapy -Have encouraged passive range of motion of wrist and elbow  Diabetes mellitus type 2 uncontrolled with hyperglycemia -Blood sugar stable.  Continue Levemir and NovoLog with meals along with SSI  Hepatitis C with cirrhosis -Compensated.  Outpatient follow-up  Thrombocytopenia -Stable.  Monitor  CBC periodically  Tobacco abuse -Counseled during the hospitalization.  Continue nicotine patch    DVT prophylaxis: Lovenox Code Status: Full Family Communication: None at bedside Disposition Plan: Remain inpatient till 11/27/2018  Consultants: Orthopedics/ID Procedures: I&D on 11/06/2018  Antimicrobials:  Vancomycin from 11/05/2018 onwards  Subjective: Patient seen and examined at bedside denies any overnight fever, nausea or vomiting.  Complains of left elbow pain.  Objective: Vitals:   11/18/18 1728 11/18/18 2159 11/19/18 0537 11/19/18 0643  BP: (!) 163/85 (!) 176/83 (!) 154/83   Pulse: 66 68 70   Resp: 17 16 17    Temp: 98.1 F (36.7 C) 98.7 F (37.1 C) 98.2 F (36.8 C)   TempSrc: Oral Oral Oral   SpO2: 99% 99% 98%   Weight:    74.2 kg  Height:        Intake/Output Summary (Last 24 hours) at 11/19/2018 1128 Last data filed at 11/19/2018 0309 Gross per 24 hour  Intake 2426.85 ml  Output -  Net 2426.85 ml   Filed Weights   11/17/18 0500 11/18/18 0500 11/19/18 0643  Weight: 74 kg 74.2 kg 74.2 kg    Examination:  General exam: Appears calm and comfortable.  No distress Respiratory system: Bilateral decreased breath sounds at bases Cardiovascular system: S1 & S2 heard, Rate controlled Gastrointestinal system: Abdomen is nondistended, soft and nontender. Normal bowel sounds heard. Extremities: No cyanosis, clubbing, edema.  Left elbow and forearm dressing present    Data Reviewed: I have personally reviewed following labs and imaging studies  CBC: Recent Labs  Lab 11/13/18 0412 11/16/18 0333 11/18/18 0430  WBC 4.6 4.6 4.5  HGB 11.5* 11.5*  11.2*  HCT 33.6* 33.5* 32.3*  MCV 93.1 92.5 92.8  PLT 126* 125* 628*   Basic Metabolic Panel: Recent Labs  Lab 11/13/18 0412 11/16/18 0333 11/18/18 0430 11/19/18 0347  NA 137 137 133*  --   K 3.8 3.8 3.8  --   CL 106 104 100  --   CO2 24 23 25   --   GLUCOSE 156* 167* 172*  --   BUN 11 13 18   --   CREATININE  0.69 0.72 0.79  --   CALCIUM 8.4* 8.5* 8.4*  --   MG 1.5* 1.5* 1.5* 1.7   GFR: Estimated Creatinine Clearance: 87.2 mL/min (by C-G formula based on SCr of 0.79 mg/dL). Liver Function Tests: No results for input(s): AST, ALT, ALKPHOS, BILITOT, PROT, ALBUMIN in the last 168 hours. No results for input(s): LIPASE, AMYLASE in the last 168 hours. No results for input(s): AMMONIA in the last 168 hours. Coagulation Profile: No results for input(s): INR, PROTIME in the last 168 hours. Cardiac Enzymes: No results for input(s): CKTOTAL, CKMB, CKMBINDEX, TROPONINI in the last 168 hours. BNP (last 3 results) No results for input(s): PROBNP in the last 8760 hours. HbA1C: No results for input(s): HGBA1C in the last 72 hours. CBG: Recent Labs  Lab 11/18/18 0758 11/18/18 1229 11/18/18 1725 11/18/18 2110 11/19/18 0842  GLUCAP 166* 142* 125* 122* 239*   Lipid Profile: No results for input(s): CHOL, HDL, LDLCALC, TRIG, CHOLHDL, LDLDIRECT in the last 72 hours. Thyroid Function Tests: No results for input(s): TSH, T4TOTAL, FREET4, T3FREE, THYROIDAB in the last 72 hours. Anemia Panel: No results for input(s): VITAMINB12, FOLATE, FERRITIN, TIBC, IRON, RETICCTPCT in the last 72 hours. Sepsis Labs: No results for input(s): PROCALCITON, LATICACIDVEN in the last 168 hours.  No results found for this or any previous visit (from the past 240 hour(s)).       Radiology Studies: No results found.      Scheduled Meds: . enoxaparin (LOVENOX) injection  40 mg Subcutaneous Q24H  . insulin aspart  0-15 Units Subcutaneous TID WC  . insulin aspart  0-5 Units Subcutaneous QHS  . insulin aspart  7 Units Subcutaneous TID WC  . insulin detemir  45 Units Subcutaneous Daily  . multivitamin with minerals  1 tablet Oral Daily  . nicotine  21 mg Transdermal Daily  . Ensure Max Protein  11 oz Oral Daily  . senna-docusate  1 tablet Oral BID  . sodium chloride flush  10-40 mL Intracatheter Q12H    Continuous Infusions: . lactated ringers 10 mL/hr at 11/19/18 0309  . vancomycin 750 mg (11/19/18 0603)     LOS: 14 days        Aline August, MD [facility] 11/19/2018, 11:28 AM

## 2018-11-20 ENCOUNTER — Telehealth: Payer: Self-pay | Admitting: Internal Medicine

## 2018-11-20 LAB — GLUCOSE, CAPILLARY
Glucose-Capillary: 146 mg/dL — ABNORMAL HIGH (ref 70–99)
Glucose-Capillary: 179 mg/dL — ABNORMAL HIGH (ref 70–99)
Glucose-Capillary: 182 mg/dL — ABNORMAL HIGH (ref 70–99)

## 2018-11-20 NOTE — Progress Notes (Signed)
Pharmacy Antibiotic Note  Juan Juarez is a 63 y.o. male admitted on 11/05/2018 with left elbow infection being admitted from ID clinic.  Pharmacy has been consulted for vancomycin dosing. Recent admit and I&D that grew MRSA; sent out on Bactrim DS.   SCr 0.79, CrCl~80-90 ml/min. Will continue the current dose for now through 5/28   Plan: - Continue Vancomycin 750 mg IV every 12 hours - Noted plans to continue Vancomycin through 11/27/18 - Will continue to follow renal function   Height: 5\' 7"  (170.2 cm) Weight: 165 lb 5.5 oz (75 kg) IBW/kg (Calculated) : 66.1  Temp (24hrs), Avg:98 F (36.7 C), Min:97.7 F (36.5 C), Max:98.2 F (36.8 C)  Recent Labs  Lab 11/13/18 1212 11/14/18 0420 11/16/18 0333 11/16/18 1920 11/17/18 0417 11/17/18 1025 11/18/18 0430  WBC  --   --  4.6  --   --   --  4.5  CREATININE  --   --  0.72  --   --   --  0.79  VANCOTROUGH  --  8*  --   --  16  --   --   VANCOPEAK 26*  --   --   --   --  18*  --   VANCORANDOM  --   --   --  14  --   --   --     Estimated Creatinine Clearance: 87.2 mL/min (by C-G formula based on SCr of 0.79 mg/dL).    No Known Allergies  Antimicrobials this admission: Vancomycin 5/6 >> (5/28)  Dose adjustments this admission: Dose decreased to 1500mg  IV q24h Dose change to 750mg  IV q12h  Microbiology results: 4/24 left elbow joint 2/2: MRSA  5/6 BCx >> ngtd  Jeran Hiltz A. Levada Dy, PharmD, [place] Pager: [phone] Please utilize Amion for appropriate phone number to reach the unit pharmacist ([place])

## 2018-11-20 NOTE — Progress Notes (Signed)
Patient ID: Juan Juarez, male   DOB: [birth year], 63 y.o.   MRN: [id number]  PROGRESS NOTE    Juan Juarez  MAU:[id number] DOB: 1955/10/29 DOA: 11/05/2018 PCP: Glenda Chroman, MD   Brief Narrative:  63 year old male with history of hypertension, dyslipidemia, hepatitis C, diabetes mellitus type 2, SDH status post craniotomy in 2019, recent I&D with biopsy of the left forearm on 10/24/2018 by Dr. Burney Gauze with subsequent cultures positive for MRSA presented with worsening left arm pain and drainage on 11/05/2018.  He was subsequently found to have MRSA left elbow septic arthritis and osteomyelitis.  He underwent repeat I&D on 11/06/2018 by Dr. Burney Gauze.  Given prior history of drug use, not a candidate for outpatient IV antimicrobial therapy and will remain on IV vancomycin till 11/27/2018 as per ID recommendations.  Assessment & Plan:   Principal Problem:   Infected elbow ([place]) Active Problems:   Diabetes mellitus with hyperglycemia (HCC)   Thrombocytopenia (HCC)   Tobacco abuse   Chronic hepatitis C without hepatic coma (HCC)  MRSA septic arthritis of the left elbow and osteomyelitis of the distal humerus and proximal radial head -Underwent I&D on 11/06/2018.  Wound care and dressing changes as per Dr. Bertis Ruddy recommendation.  Prior hospitalist had spoken with Dr. Burney Gauze on 11/13/2018 over the phone and subsequently dressing change was done on 11/14/2018 by his RN.  Will try and reach out to Dr. Burney Gauze again today because of patient complaining of increased swelling of his left upper extremity. -Continue IV vancomycin inpatient till 11/27/2018.  Given prior history of drug use, unfortunately not a candidate for outpatient IV antimicrobial therapy -We will need Bactrim and rifampin for additional 3 weeks after completion of IV therapy -Have encouraged passive range of motion of wrist and elbow -No recent fevers.  Hemodynamically stable. -Monitor labs intermittently.  Diabetes mellitus type 2  uncontrolled with hyperglycemia -Blood sugar stable.  Continue Levemir and NovoLog with meals along with SSI  Hepatitis C with cirrhosis -Compensated.  Outpatient follow-up  Thrombocytopenia -Stable.  Monitor CBC periodically  Tobacco abuse -Counseled during the hospitalization.  Continue nicotine patch    DVT prophylaxis: Lovenox Code Status: Full Family Communication: None at bedside Disposition Plan: Remain inpatient till 11/27/2018  Consultants: Orthopedics/ID Procedures: I&D on 11/06/2018  Antimicrobials:  Vancomycin from 11/05/2018 onwards  Subjective: Patient seen and examined at bedside.  He denies any overnight fever, nausea or vomiting.  Complains of intermittent left elbow pain and some left upper extremity swelling.  Objective: Vitals:   11/19/18 1401 11/19/18 2303 11/20/18 0432 11/20/18 0626  BP: (!) 148/89 (!) 155/91  (!) 146/82  Pulse: 71 61  73  Resp: 18 16  14   Temp: 98.1 F (36.7 C) 97.7 F (36.5 C)  98.2 F (36.8 C)  TempSrc: Oral Oral    SpO2: 98% 96%  99%  Weight:   75 kg   Height:        Intake/Output Summary (Last 24 hours) at 11/20/2018 0744 Last data filed at 11/19/2018 1404 Gross per 24 hour  Intake 344 ml  Output -  Net 344 ml   Filed Weights   11/18/18 0500 11/19/18 0643 11/20/18 0432  Weight: 74.2 kg 74.2 kg 75 kg    Examination:  General exam: No acute distress.  Looks older than stated age. Respiratory system: Bilateral decreased breath sounds at bases with some scattered crackles Cardiovascular system: S1 & S2 heard, Rate controlled Gastrointestinal system: Abdomen is nondistended, soft and nontender. Normal bowel sounds heard.  Extremities: No cyanosis.  Left elbow and forearm dressing present.  Left wrist appears swollen; distal neurovascular status is intact.    Data Reviewed: I have personally reviewed following labs and imaging studies  CBC: Recent Labs  Lab 11/16/18 0333 11/18/18 0430  WBC 4.6 4.5  HGB 11.5* 11.2*   HCT 33.5* 32.3*  MCV 92.5 92.8  PLT 125* 742*   Basic Metabolic Panel: Recent Labs  Lab 11/16/18 0333 11/18/18 0430 11/19/18 0347  NA 137 133*  --   K 3.8 3.8  --   CL 104 100  --   CO2 23 25  --   GLUCOSE 167* 172*  --   BUN 13 18  --   CREATININE 0.72 0.79  --   CALCIUM 8.5* 8.4*  --   MG 1.5* 1.5* 1.7   GFR: Estimated Creatinine Clearance: 87.2 mL/min (by C-G formula based on SCr of 0.79 mg/dL). Liver Function Tests: No results for input(s): AST, ALT, ALKPHOS, BILITOT, PROT, ALBUMIN in the last 168 hours. No results for input(s): LIPASE, AMYLASE in the last 168 hours. No results for input(s): AMMONIA in the last 168 hours. Coagulation Profile: No results for input(s): INR, PROTIME in the last 168 hours. Cardiac Enzymes: No results for input(s): CKTOTAL, CKMB, CKMBINDEX, TROPONINI in the last 168 hours. BNP (last 3 results) No results for input(s): PROBNP in the last 8760 hours. HbA1C: No results for input(s): HGBA1C in the last 72 hours. CBG: Recent Labs  Lab 11/18/18 2110 11/19/18 0842 11/19/18 1238 11/19/18 1704 11/19/18 2258  GLUCAP 122* 239* 100* 117* 222*   Lipid Profile: No results for input(s): CHOL, HDL, LDLCALC, TRIG, CHOLHDL, LDLDIRECT in the last 72 hours. Thyroid Function Tests: No results for input(s): TSH, T4TOTAL, FREET4, T3FREE, THYROIDAB in the last 72 hours. Anemia Panel: No results for input(s): VITAMINB12, FOLATE, FERRITIN, TIBC, IRON, RETICCTPCT in the last 72 hours. Sepsis Labs: No results for input(s): PROCALCITON, LATICACIDVEN in the last 168 hours.  No results found for this or any previous visit (from the past 240 hour(s)).       Radiology Studies: No results found.      Scheduled Meds: . enoxaparin (LOVENOX) injection  40 mg Subcutaneous Q24H  . insulin aspart  0-15 Units Subcutaneous TID WC  . insulin aspart  0-5 Units Subcutaneous QHS  . insulin aspart  7 Units Subcutaneous TID WC  . insulin detemir  45 Units  Subcutaneous Daily  . multivitamin with minerals  1 tablet Oral Daily  . nicotine  21 mg Transdermal Daily  . Ensure Max Protein  11 oz Oral Daily  . senna-docusate  1 tablet Oral BID  . sodium chloride flush  10-40 mL Intracatheter Q12H   Continuous Infusions: . lactated ringers 10 mL/hr at 11/19/18 0309  . vancomycin 750 mg (11/20/18 5956)     LOS: 15 days        Aline August, MD [facility] 11/20/2018, 7:44 AM

## 2018-11-20 NOTE — Progress Notes (Signed)
Orthopedic Tech Progress Note Patient Details:  Juan Juarez 07-30-1955 [id number]  Ortho Devices Type of Ortho Device: Arm sling Ortho Device/Splint Location: left Ortho Device/Splint Interventions: Application   Post Interventions Patient Tolerated: Well Instructions Provided: Care of device   [place] Pink 11/20/2018, 4:23 PM

## 2018-11-20 NOTE — Progress Notes (Signed)
An orthopedic visit made today.  Patient was to have sutures removed; however he was taking a bath & asked if I could come back tomorrow.  The hospitalist was concerned about the swelling of his arm & hand.  I again told the patient to keep it elevated & asked the nurse to put a lighter bandage on which would not be as tight.  I will call the hospitalist to order a sling.  The arm did not look swollen but hand was slightly still swollen.  I will return tomorrow for suture removal & to check the  swelling.  He had no other complaints.

## 2018-11-20 NOTE — Telephone Encounter (Signed)
COVID-19 Pre-Screening Questions: ° °Do you currently have a fever (>100 °F), chills or unexplained body aches? No  ° °Are you currently experiencing new cough, shortness of breath, sore throat, runny nose? No  °•  °Have you recently travelled outside the state of [place] in the last 14 days? No  °•  °Have you been in contact with someone that is currently pending confirmation of Covid19 testing or has been confirmed to have the Covid19 virus?  No  °

## 2018-11-21 LAB — C-REACTIVE PROTEIN: CRP: 0.9 mg/dL (ref <1.0)

## 2018-11-21 LAB — BASIC METABOLIC PANEL
Anion gap: 10 (ref 5–15)
BUN: 23 mg/dL (ref 8–23)
CO2: 24 mmol/L (ref 22–32)
Calcium: 8.8 mg/dL — ABNORMAL LOW (ref 8.9–10.3)
Chloride: 102 mmol/L (ref 98–111)
Creatinine, Ser: 0.74 mg/dL (ref 0.61–1.24)
GFR calc Af Amer: >60 mL/min (ref >60)
GFR calc non Af Amer: >60 mL/min (ref >60)
Glucose, Bld: 157 mg/dL — ABNORMAL HIGH (ref 70–99)
Potassium: 3.9 mmol/L (ref 3.5–5.1)
Sodium: 136 mmol/L (ref 135–145)

## 2018-11-21 LAB — CBC WITH DIFFERENTIAL/PLATELET
Abs Immature Granulocytes: 0.01 10*3/uL (ref 0.00–0.07)
Basophils Absolute: 0 10*3/uL (ref 0.0–0.1)
Basophils Relative: 1 %
Eosinophils Absolute: 0.1 10*3/uL (ref 0.0–0.5)
Eosinophils Relative: 2 %
HCT: 31.5 % — ABNORMAL LOW (ref 39.0–52.0)
Hemoglobin: 11.1 g/dL — ABNORMAL LOW (ref 13.0–17.0)
Immature Granulocytes: 0 %
Lymphocytes Relative: 42 %
Lymphs Abs: 1.7 10*3/uL (ref 0.7–4.0)
MCH: 32.3 pg (ref 26.0–34.0)
MCHC: 35.2 g/dL (ref 30.0–36.0)
MCV: 91.6 fL (ref 80.0–100.0)
Monocytes Absolute: 0.5 10*3/uL (ref 0.1–1.0)
Monocytes Relative: 12 %
Neutro Abs: 1.8 10*3/uL (ref 1.7–7.7)
Neutrophils Relative %: 43 %
Platelets: 119 10*3/uL — ABNORMAL LOW (ref 150–400)
RBC: 3.44 MIL/uL — ABNORMAL LOW (ref 4.22–5.81)
RDW: 14.6 % (ref 11.5–15.5)
WBC: 4.1 10*3/uL (ref 4.0–10.5)
nRBC: 0 % (ref 0.0–0.2)

## 2018-11-21 LAB — GLUCOSE, CAPILLARY
Glucose-Capillary: 232 mg/dL — ABNORMAL HIGH (ref 70–99)
Glucose-Capillary: 102 mg/dL — ABNORMAL HIGH (ref 70–99)
Glucose-Capillary: 314 mg/dL — ABNORMAL HIGH (ref 70–99)

## 2018-11-21 LAB — MAGNESIUM: Magnesium: 1.5 mg/dL — ABNORMAL LOW (ref 1.7–2.4)

## 2018-11-21 MED ORDER — MAGNESIUM SULFATE 2 GM/50ML IV SOLN
2.0000 g | Freq: Once | INTRAVENOUS | Status: AC
  Administered 2018-11-21: 2 g via INTRAVENOUS
  Filled 2018-11-21: qty 50

## 2018-11-21 NOTE — TOC Progression Note (Signed)
Transition of Care [facility]) - Progression Note    Patient Details  Name: Juan Juarez MRN: [id number] Date of Birth: Jun 21, 1956  Transition of Care [facility]) CM/SW [place], [place] Phone Number: 11/21/2018, 4:20 PM  Clinical Narrative:    Patient requested to see CSW regarding housing. CSW provided housing resources, including senior apartments. Patient stated that he lived with his girlfriend for 15 years but she passed away right before he came to the hospital and her sons are trying to kick him out of her house. Patient wanted to know if he had any legal rights regarding staying at the house. CSW provided contact info for Legal Aide of [place]. CSW spoke with patient regarding substance use as well but patient reported that he only used to drink alcohol bu quit cold [place] two years ago. He declined substance use resources. Patient also reported wanting pain medicine; CSW followed up with his nurse who confirmed that he had recently received pain meds.    Expected Discharge Plan: [place] Barriers to Discharge: Continued Medical Work up  Expected Discharge Plan and Services Expected Discharge Plan: [place]   Discharge Planning Services: CM Consult Post Acute Care Choice: [place] arrangements for the past 2 months: Single Family Home                 DME Arranged: N/A DME Agency: NA       HH Arranged: RN, PT HH Agency: [place] Date [place]: 11/07/18 Time [place]: 1717 Representative spoke with at [place]: [place] ([place]) Interventions    Readmission Risk Interventions No flowsheet data found.

## 2018-11-21 NOTE — Progress Notes (Addendum)
Patient ID: Juan Juarez, male   DOB: 1956-03-21, 63 y.o.   MRN: [id number]  PROGRESS NOTE    Juan Juarez  [id number] DOB: 06-11-1956 DOA: 11/05/2018 PCP: Glenda Chroman, MD   Brief Narrative:  63 year old male with history of hypertension, dyslipidemia, hepatitis C, diabetes mellitus type 2, SDH status post craniotomy in 2019, recent I&D with biopsy of the left forearm on 10/24/2018 by Dr. Burney Gauze with subsequent cultures positive for MRSA presented with worsening left arm pain and drainage on 11/05/2018.  He was subsequently found to have MRSA left elbow septic arthritis and osteomyelitis.  He underwent repeat I&D on 11/06/2018 by Dr. Burney Gauze.  Given prior history of drug use, not a candidate for outpatient IV antimicrobial therapy and will remain on IV vancomycin till 11/27/2018 as per ID recommendations.  Assessment & Plan:   Principal Problem:   Infected elbow ([place]) Active Problems:   Diabetes mellitus with hyperglycemia (HCC)   Thrombocytopenia (HCC)   Tobacco abuse   Chronic hepatitis C without hepatic coma (HCC)  MRSA septic arthritis of the left elbow and osteomyelitis of the distal humerus and proximal radial head -Underwent I&D on 11/06/2018.  Wound care and dressing changes as per Dr. Bertis Ruddy recommendation.  Prior hospitalist had spoken with Dr. Burney Gauze on 11/13/2018 over the phone and subsequently dressing change was done on 11/14/2018 by his RN.  Spoke to Dr. Bertis Ruddy nurse on 11/20/2018 on phone who stated that she would evaluate the patient and look at the wound and dressing. -Continue IV vancomycin inpatient till 11/27/2018.  Given prior history of drug use, unfortunately not a candidate for outpatient IV antimicrobial therapy - will need Bactrim and rifampin for additional 3 weeks after completion of IV therapy -Have encouraged passive range of motion of wrist and elbow -No recent fevers.  Hemodynamically stable. -Monitor labs intermittently.  Diabetes mellitus type 2  uncontrolled with hyperglycemia -Blood sugar stable.  Continue Levemir and NovoLog with meals along with SSI  Hypomagnesemia -Replace.  Hepatitis C with cirrhosis -Compensated.  Outpatient follow-up  Thrombocytopenia -Stable.  Monitor CBC periodically  Tobacco abuse -Counseled during the hospitalization.  Continue nicotine patch    DVT prophylaxis: Lovenox Code Status: Full Family Communication: None at bedside Disposition Plan: Remain inpatient till 11/27/2018  Consultants: Orthopedics/ID Procedures: I&D on 11/06/2018  Antimicrobials:  Vancomycin from 11/05/2018 onwards  Subjective: Patient seen and examined at bedside.  Still complains of some left upper extremity swelling.  No overnight fever, nausea or vomiting. Objective: Vitals:   11/20/18 0626 11/20/18 2137 11/21/18 0503 11/21/18 0534  BP: (!) 146/82 134/75  126/84  Pulse: 73 70  72  Resp: 14 17  16   Temp: 98.2 F (36.8 C) 98.4 F (36.9 C)  98.6 F (37 C)  TempSrc:  Oral  Oral  SpO2: 99% 97%  99%  Weight:   76.2 kg   Height:        Intake/Output Summary (Last 24 hours) at 11/21/2018 0736 Last data filed at 11/21/2018 0535 Gross per 24 hour  Intake 732 ml  Output -  Net 732 ml   Filed Weights   11/19/18 0643 11/20/18 0432 11/21/18 0503  Weight: 74.2 kg 75 kg 76.2 kg    Examination:  General exam: No distress.  Looks older than stated age. Respiratory system: Bilateral decreased breath sounds at bases with some scattered crackles.  No wheezing Cardiovascular system: Rate controlled, S1 & S2 heard Gastrointestinal system: Abdomen is nondistended, soft and nontender. Normal bowel sounds heard. Extremities: No  cyanosis.  Left elbow and forearm dressing present.  Left wrist appears swollen; distal neurovascular status is intact.    Data Reviewed: I have personally reviewed following labs and imaging studies  CBC: Recent Labs  Lab 11/16/18 0333 11/18/18 0430 11/21/18 0414  WBC 4.6 4.5 4.1   NEUTROABS  --   --  1.8  HGB 11.5* 11.2* 11.1*  HCT 33.5* 32.3* 31.5*  MCV 92.5 92.8 91.6  PLT 125* 131* 709*   Basic Metabolic Panel: Recent Labs  Lab 11/16/18 0333 11/18/18 0430 11/19/18 0347 11/21/18 0414  NA 137 133*  --  136  K 3.8 3.8  --  3.9  CL 104 100  --  102  CO2 23 25  --  24  GLUCOSE 167* 172*  --  157*  BUN 13 18  --  23  CREATININE 0.72 0.79  --  0.74  CALCIUM 8.5* 8.4*  --  8.8*  MG 1.5* 1.5* 1.7 1.5*   GFR: Estimated Creatinine Clearance: 87.2 mL/min (by C-G formula based on SCr of 0.74 mg/dL). Liver Function Tests: No results for input(s): AST, ALT, ALKPHOS, BILITOT, PROT, ALBUMIN in the last 168 hours. No results for input(s): LIPASE, AMYLASE in the last 168 hours. No results for input(s): AMMONIA in the last 168 hours. Coagulation Profile: No results for input(s): INR, PROTIME in the last 168 hours. Cardiac Enzymes: No results for input(s): CKTOTAL, CKMB, CKMBINDEX, TROPONINI in the last 168 hours. BNP (last 3 results) No results for input(s): PROBNP in the last 8760 hours. HbA1C: No results for input(s): HGBA1C in the last 72 hours. CBG: Recent Labs  Lab 11/19/18 1704 11/19/18 2258 11/20/18 0809 11/20/18 1643 11/20/18 2134  GLUCAP 117* 222* 146* 179* 182*   Lipid Profile: No results for input(s): CHOL, HDL, LDLCALC, TRIG, CHOLHDL, LDLDIRECT in the last 72 hours. Thyroid Function Tests: No results for input(s): TSH, T4TOTAL, FREET4, T3FREE, THYROIDAB in the last 72 hours. Anemia Panel: No results for input(s): VITAMINB12, FOLATE, FERRITIN, TIBC, IRON, RETICCTPCT in the last 72 hours. Sepsis Labs: No results for input(s): PROCALCITON, LATICACIDVEN in the last 168 hours.  No results found for this or any previous visit (from the past 240 hour(s)).       Radiology Studies: No results found.      Scheduled Meds: . enoxaparin (LOVENOX) injection  40 mg Subcutaneous Q24H  . insulin aspart  0-15 Units Subcutaneous TID WC  .  insulin aspart  0-5 Units Subcutaneous QHS  . insulin aspart  7 Units Subcutaneous TID WC  . insulin detemir  45 Units Subcutaneous Daily  . multivitamin with minerals  1 tablet Oral Daily  . nicotine  21 mg Transdermal Daily  . Ensure Max Protein  11 oz Oral Daily  . senna-docusate  1 tablet Oral BID  . sodium chloride flush  10-40 mL Intracatheter Q12H   Continuous Infusions: . lactated ringers 10 mL/hr at 11/19/18 0309  . vancomycin 750 mg (11/21/18 0426)     LOS: 16 days        Aline August, MD [facility] 11/21/2018, 7:36 AM

## 2018-11-22 LAB — GLUCOSE, CAPILLARY
Glucose-Capillary: 222 mg/dL — ABNORMAL HIGH (ref 70–99)
Glucose-Capillary: 181 mg/dL — ABNORMAL HIGH (ref 70–99)
Glucose-Capillary: 207 mg/dL — ABNORMAL HIGH (ref 70–99)
Glucose-Capillary: 126 mg/dL — ABNORMAL HIGH (ref 70–99)
Glucose-Capillary: 209 mg/dL — ABNORMAL HIGH (ref 70–99)

## 2018-11-22 LAB — VANCOMYCIN, PEAK: Vancomycin Pk: 22 ug/mL — ABNORMAL LOW (ref 30–40)

## 2018-11-22 MED ORDER — INSULIN DETEMIR 100 UNIT/ML ~~LOC~~ SOLN
50.0000 [IU] | Freq: Every day | SUBCUTANEOUS | Status: DC
  Administered 2018-11-22 – 2018-11-27 (×6): 50 [IU] via SUBCUTANEOUS
  Filled 2018-11-22 (×6): qty 0.5

## 2018-11-22 NOTE — Progress Notes (Signed)
Patient ID: Juan Juarez, male   DOB: 05-Oct-1955, 63 y.o.   MRN: [id number]  PROGRESS NOTE    Tynell Winchell  CZY:[id number] DOB: 31-Mar-1956 DOA: 11/05/2018 PCP: Glenda Chroman, MD   Brief Narrative:  63 year old male with history of hypertension, dyslipidemia, hepatitis C, diabetes mellitus type 2, SDH status post craniotomy in 2019, recent I&D with biopsy of the left forearm on 10/24/2018 by Dr. Burney Gauze with subsequent cultures positive for MRSA presented with worsening left arm pain and drainage on 11/05/2018.  He was subsequently found to have MRSA left elbow septic arthritis and osteomyelitis.  He underwent repeat I&D on 11/06/2018 by Dr. Burney Gauze.  Given prior history of drug use, not a candidate for outpatient IV antimicrobial therapy and will remain on IV vancomycin till 11/27/2018 as per ID recommendations.  Assessment & Plan:   Principal Problem:   Infected elbow ([place]) Active Problems:   Diabetes mellitus with hyperglycemia (HCC)   Thrombocytopenia (HCC)   Tobacco abuse   Chronic hepatitis C without hepatic coma (HCC)  MRSA septic arthritis of the left elbow and osteomyelitis of the distal humerus and proximal radial head -Underwent I&D on 11/06/2018.  Wound care and dressing changes as per Dr. Bertis Ruddy recommendation.  Prior hospitalist had spoken with Dr. Burney Gauze on 11/13/2018 over the phone and subsequently dressing change was done on 11/14/2018 by his RN.  Spoke to Dr. Bertis Ruddy nurse on 11/20/2018 on phone who stated that she would evaluate the patient and look at the wound and dressing.  This is pending. -Continue IV vancomycin inpatient till 11/27/2018.  Given prior history of drug use, unfortunately not a candidate for outpatient IV antimicrobial therapy - will need Bactrim and rifampin for additional 3 weeks after completion of IV therapy -Have encouraged passive range of motion of wrist and elbow -Monitor labs intermittently. -Currently afebrile.  Diabetes mellitus type 2  uncontrolled with hyperglycemia -Blood sugar stable.  Increase Levemir to 50 units daily.  Continue NovoLog with meals along with SSI  Hepatitis C with cirrhosis -Compensated.  Outpatient follow-up  Thrombocytopenia -Stable.  Monitor CBC periodically  Tobacco abuse -Counseled during the hospitalization.  Continue nicotine patch    DVT prophylaxis: Lovenox Code Status: Full Family Communication: None at bedside Disposition Plan: Remain inpatient till 11/27/2018  Consultants: Orthopedics/ID Procedures: I&D on 11/06/2018  Antimicrobials:  Vancomycin from 11/05/2018 onwards  Subjective: Patient seen and examined at bedside.  Denies any overnight fever, nausea or vomiting.  Still complains of intermittent left upper extremity pain. Objective: Vitals:   11/21/18 0534 11/21/18 1335 11/21/18 2129 11/22/18 0708  BP: 126/84 (!) 144/65 (!) 176/86 139/87  Pulse: 72 76 74 75  Resp: 16 16 18    Temp: 98.6 F (37 C) 98 F (36.7 C) 97.7 F (36.5 C) 98.2 F (36.8 C)  TempSrc: Oral Oral Oral Oral  SpO2: 99% 100% 100% 98%  Weight:      Height:        Intake/Output Summary (Last 24 hours) at 11/22/2018 0946 Last data filed at 11/22/2018 0559 Gross per 24 hour  Intake 494.27 ml  Output -  Net 494.27 ml   Filed Weights   11/19/18 0643 11/20/18 0432 11/21/18 0503  Weight: 74.2 kg 75 kg 76.2 kg    Examination:  General exam: No acute distress.  Looks older than stated age. Respiratory system: Bilateral decreased breath sounds at bases with some scattered crackles.   Cardiovascular system: S1-S2 heard, rate controlled Gastrointestinal system: Abdomen is nondistended, soft and nontender. Normal bowel  sounds heard. Extremities: No cyanosis.  Left elbow and forearm dressing present.  Left wrist appears swollen; distal neurovascular status is intact.    Data Reviewed: I have personally reviewed following labs and imaging studies  CBC: Recent Labs  Lab 11/16/18 0333 11/18/18 0430  11/21/18 0414  WBC 4.6 4.5 4.1  NEUTROABS  --   --  1.8  HGB 11.5* 11.2* 11.1*  HCT 33.5* 32.3* 31.5*  MCV 92.5 92.8 91.6  PLT 125* 131* 335*   Basic Metabolic Panel: Recent Labs  Lab 11/16/18 0333 11/18/18 0430 11/19/18 0347 11/21/18 0414  NA 137 133*  --  136  K 3.8 3.8  --  3.9  CL 104 100  --  102  CO2 23 25  --  24  GLUCOSE 167* 172*  --  157*  BUN 13 18  --  23  CREATININE 0.72 0.79  --  0.74  CALCIUM 8.5* 8.4*  --  8.8*  MG 1.5* 1.5* 1.7 1.5*   GFR: Estimated Creatinine Clearance: 87.2 mL/min (by C-G formula based on SCr of 0.74 mg/dL). Liver Function Tests: No results for input(s): AST, ALT, ALKPHOS, BILITOT, PROT, ALBUMIN in the last 168 hours. No results for input(s): LIPASE, AMYLASE in the last 168 hours. No results for input(s): AMMONIA in the last 168 hours. Coagulation Profile: No results for input(s): INR, PROTIME in the last 168 hours. Cardiac Enzymes: No results for input(s): CKTOTAL, CKMB, CKMBINDEX, TROPONINI in the last 168 hours. BNP (last 3 results) No results for input(s): PROBNP in the last 8760 hours. HbA1C: No results for input(s): HGBA1C in the last 72 hours. CBG: Recent Labs  Lab 11/21/18 0812 11/21/18 1154 11/21/18 1655 11/21/18 2125 11/22/18 0803  GLUCAP 232* 102* 314* 222* 181*   Lipid Profile: No results for input(s): CHOL, HDL, LDLCALC, TRIG, CHOLHDL, LDLDIRECT in the last 72 hours. Thyroid Function Tests: No results for input(s): TSH, T4TOTAL, FREET4, T3FREE, THYROIDAB in the last 72 hours. Anemia Panel: No results for input(s): VITAMINB12, FOLATE, FERRITIN, TIBC, IRON, RETICCTPCT in the last 72 hours. Sepsis Labs: No results for input(s): PROCALCITON, LATICACIDVEN in the last 168 hours.  No results found for this or any previous visit (from the past 240 hour(s)).       Radiology Studies: No results found.      Scheduled Meds: . enoxaparin (LOVENOX) injection  40 mg Subcutaneous Q24H  . insulin aspart  0-15  Units Subcutaneous TID WC  . insulin aspart  0-5 Units Subcutaneous QHS  . insulin aspart  7 Units Subcutaneous TID WC  . insulin detemir  45 Units Subcutaneous Daily  . multivitamin with minerals  1 tablet Oral Daily  . nicotine  21 mg Transdermal Daily  . Ensure Max Protein  11 oz Oral Daily  . senna-docusate  1 tablet Oral BID  . sodium chloride flush  10-40 mL Intracatheter Q12H   Continuous Infusions: . lactated ringers 10 mL/hr at 11/19/18 0309  . vancomycin 750 mg (11/22/18 0517)     LOS: 17 days        Aline August, MD [facility] 11/22/2018, 9:46 AM

## 2018-11-23 LAB — GLUCOSE, CAPILLARY
Glucose-Capillary: 147 mg/dL — ABNORMAL HIGH (ref 70–99)
Glucose-Capillary: 128 mg/dL — ABNORMAL HIGH (ref 70–99)
Glucose-Capillary: 161 mg/dL — ABNORMAL HIGH (ref 70–99)
Glucose-Capillary: 241 mg/dL — ABNORMAL HIGH (ref 70–99)

## 2018-11-23 LAB — VANCOMYCIN, TROUGH: Vancomycin Tr: 10 ug/mL — ABNORMAL LOW (ref 15–20)

## 2018-11-23 MED ORDER — VANCOMYCIN HCL IN DEXTROSE 1-5 GM/200ML-% IV SOLN
1000.0000 mg | Freq: Two times a day (BID) | INTRAVENOUS | Status: AC
  Administered 2018-11-23 – 2018-11-27 (×9): 1000 mg via INTRAVENOUS
  Filled 2018-11-23 (×9): qty 200

## 2018-11-23 NOTE — Progress Notes (Signed)
Patient ID: Juan Juarez, male   DOB: [birth year], 63 y.o.   MRN: [id number]  PROGRESS NOTE    Juan Juarez  [id number]:[id number] DOB: 04-Jan-1956 DOA: 11/05/2018 PCP: Glenda Chroman, MD   Brief Narrative:  63 year old male with history of hypertension, dyslipidemia, hepatitis C, diabetes mellitus type 2, SDH status post craniotomy in 2019, recent I&D with biopsy of the left forearm on 10/24/2018 by Dr. Burney Gauze with subsequent cultures positive for MRSA presented with worsening left arm pain and drainage on 11/05/2018.  He was subsequently found to have MRSA left elbow septic arthritis and osteomyelitis.  He underwent repeat I&D on 11/06/2018 by Dr. Burney Gauze.  Given prior history of drug use, not a candidate for outpatient IV antimicrobial therapy and will remain on IV vancomycin till 11/27/2018 as per ID recommendations.  Assessment & Plan:   Principal Problem:   Infected elbow ([place]) Active Problems:   Diabetes mellitus with hyperglycemia (HCC)   Thrombocytopenia (HCC)   Tobacco abuse   Chronic hepatitis C without hepatic coma (HCC)  MRSA septic arthritis of the left elbow and osteomyelitis of the distal humerus and proximal radial head -Underwent I&D on 11/06/2018.  Wound care and dressing changes as per Dr. Bertis Ruddy recommendation.  Prior hospitalist had spoken with Dr. Burney Gauze on 11/13/2018 over the phone and subsequently dressing change was done on 11/14/2018 by his RN.  Spoke to Dr. Bertis Ruddy nurse on 11/20/2018 on phone who stated that she would evaluate the patient and look at the wound and dressing.  This is pending. -Continue IV vancomycin inpatient till 11/27/2018.  Given prior history of drug use, unfortunately not a candidate for outpatient IV antimicrobial therapy - will need Bactrim and rifampin for additional 3 weeks after completion of IV therapy -Have encouraged passive range of motion of wrist and elbow -Monitor labs intermittently. -Currently hemodynamically stable and afebrile.  Diabetes  mellitus type 2 uncontrolled with hyperglycemia -Blood sugar stable.  Continue Levemir.  Continue NovoLog with meals along with SSI  Hepatitis C with cirrhosis -Compensated.  Outpatient follow-up  Thrombocytopenia -Stable.  Monitor CBC periodically  Tobacco abuse -Counseled during the hospitalization.  Continue nicotine patch    DVT prophylaxis: Lovenox Code Status: Full Family Communication: None at bedside Disposition Plan: Remain inpatient till 11/27/2018  Consultants: Orthopedics/ID Procedures: I&D on 11/06/2018  Antimicrobials:  Vancomycin from 11/05/2018 onwards  Subjective: Patient seen and examined at bedside.  No overnight fever, nausea or vomiting.  Objective: Vitals:   11/22/18 0708 11/22/18 1502 11/22/18 2104 11/23/18 0615  BP: 139/87 (!) 152/88 129/83 119/74  Pulse: 75 75 74 75  Resp:  18 18 18   Temp: 98.2 F (36.8 C) 98.8 F (37.1 C) 98 F (36.7 C) 98.3 F (36.8 C)  TempSrc: Oral Oral Oral Oral  SpO2: 98% 99% 99% 100%  Weight:      Height:        Intake/Output Summary (Last 24 hours) at 11/23/2018 0748 Last data filed at 11/23/2018 0157 Gross per 24 hour  Intake 504.05 ml  Output -  Net 504.05 ml   Filed Weights   11/19/18 0643 11/20/18 0432 11/21/18 0503  Weight: 74.2 kg 75 kg 76.2 kg    Examination:  General exam: No distress.  Looks older than stated age. Respiratory system: Bilateral decreased breath sounds at bases with some scattered crackles.  No wheezing Cardiovascular system: S1-S2 heard, rate controlled Gastrointestinal system: Abdomen is nondistended, soft and nontender. Normal bowel sounds heard. Extremities: No cyanosis.  Left elbow and forearm dressing  present.  Left wrist appears swollen; distal neurovascular status is intact.    Data Reviewed: I have personally reviewed following labs and imaging studies  CBC: Recent Labs  Lab 11/18/18 0430 11/21/18 0414  WBC 4.5 4.1  NEUTROABS  --  1.8  HGB 11.2* 11.1*  HCT 32.3* 31.5*   MCV 92.8 91.6  PLT 131* 616*   Basic Metabolic Panel: Recent Labs  Lab 11/18/18 0430 11/19/18 0347 11/21/18 0414  NA 133*  --  136  K 3.8  --  3.9  CL 100  --  102  CO2 25  --  24  GLUCOSE 172*  --  157*  BUN 18  --  23  CREATININE 0.79  --  0.74  CALCIUM 8.4*  --  8.8*  MG 1.5* 1.7 1.5*   GFR: Estimated Creatinine Clearance: 87.2 mL/min (by C-G formula based on SCr of 0.74 mg/dL). Liver Function Tests: No results for input(s): AST, ALT, ALKPHOS, BILITOT, PROT, ALBUMIN in the last 168 hours. No results for input(s): LIPASE, AMYLASE in the last 168 hours. No results for input(s): AMMONIA in the last 168 hours. Coagulation Profile: No results for input(s): INR, PROTIME in the last 168 hours. Cardiac Enzymes: No results for input(s): CKTOTAL, CKMB, CKMBINDEX, TROPONINI in the last 168 hours. BNP (last 3 results) No results for input(s): PROBNP in the last 8760 hours. HbA1C: No results for input(s): HGBA1C in the last 72 hours. CBG: Recent Labs  Lab 11/21/18 2125 11/22/18 0803 11/22/18 1203 11/22/18 1717 11/22/18 2101  GLUCAP 222* 181* 207* 126* 209*   Lipid Profile: No results for input(s): CHOL, HDL, LDLCALC, TRIG, CHOLHDL, LDLDIRECT in the last 72 hours. Thyroid Function Tests: No results for input(s): TSH, T4TOTAL, FREET4, T3FREE, THYROIDAB in the last 72 hours. Anemia Panel: No results for input(s): VITAMINB12, FOLATE, FERRITIN, TIBC, IRON, RETICCTPCT in the last 72 hours. Sepsis Labs: No results for input(s): PROCALCITON, LATICACIDVEN in the last 168 hours.  No results found for this or any previous visit (from the past 240 hour(s)).       Radiology Studies: No results found.      Scheduled Meds: . enoxaparin (LOVENOX) injection  40 mg Subcutaneous Q24H  . insulin aspart  0-15 Units Subcutaneous TID WC  . insulin aspart  0-5 Units Subcutaneous QHS  . insulin aspart  7 Units Subcutaneous TID WC  . insulin detemir  50 Units Subcutaneous Daily   . multivitamin with minerals  1 tablet Oral Daily  . nicotine  21 mg Transdermal Daily  . Ensure Max Protein  11 oz Oral Daily  . senna-docusate  1 tablet Oral BID  . sodium chloride flush  10-40 mL Intracatheter Q12H   Continuous Infusions: . lactated ringers 10 mL/hr at 11/19/18 0309  . vancomycin 1,000 mg (11/23/18 0552)     LOS: 18 days        Aline August, MD [facility] 11/23/2018, 7:48 AM

## 2018-11-23 NOTE — Progress Notes (Addendum)
Pharmacy Antibiotic Note  Juan Juarez is a 63 y.o. male admitted on 11/05/2018 with left elbow infection being admitted from ID clinic.  Pharmacy has been consulted for vancomycin dosing. Recent admit and I&D that grew MRSA; sent out on Bactrim DS.   Vancomycin peak came back at 22, with trough level at 10- calculated AUC came back subtherapeutic at 337. Scr stable at 0.74 on last check.    Plan: - Increase Vancomycin to 1000 mg every 12 hours for estAUC 529 - Noted plans to continue Vancomycin through 11/27/18 - Will continue to follow renal function, monitor levels as appropriate   Height: 5\' 7"  (170.2 cm) Weight: 167 lb 15.9 oz (76.2 kg) IBW/kg (Calculated) : 66.1  Temp (24hrs), Avg:98.3 F (36.8 C), Min:98 F (36.7 C), Max:98.8 F (37.1 C)  Recent Labs  Lab 11/16/18 1920 11/17/18 0417 11/17/18 1025 11/18/18 0430 11/21/18 0414 11/22/18 1901 11/23/18 0357  WBC  --   --   --  4.5 4.1  --   --   CREATININE  --   --   --  0.79 0.74  --   --   VANCOTROUGH  --  16  --   --   --   --  10*  VANCOPEAK  --   --  18*  --   --  22*  --   VANCORANDOM 14  --   --   --   --   --   --     Estimated Creatinine Clearance: 87.2 mL/min (by C-G formula based on SCr of 0.74 mg/dL).    No Known Allergies  Antimicrobials this admission: Vancomycin 5/6 >> (5/28)  Dose adjustments this admission: 5/9: dose decreased to 1500mg  IV q24h (VP 31, VT 14) 5/15: dose change to 750mg  IV q12h (VP 26, VT 8) 5/24: dose increased to 1000 mg IV every 12h (VP 22, VT 10)  Microbiology results: 4/24 left elbow joint 2/2: MRSA  5/6 BCx >> ngtd  Antonietta Jewel, PharmD, BCCCP Clinical Pharmacist  Pager: [phone] Phone: [phone] Please utilize [facility] for appropriate phone number to reach the unit pharmacist ([place])

## 2018-11-24 LAB — BASIC METABOLIC PANEL
Anion gap: 5 (ref 5–15)
BUN: 17 mg/dL (ref 8–23)
CO2: 27 mmol/L (ref 22–32)
Calcium: 8.6 mg/dL — ABNORMAL LOW (ref 8.9–10.3)
Chloride: 104 mmol/L (ref 98–111)
Creatinine, Ser: 0.79 mg/dL (ref 0.61–1.24)
GFR calc Af Amer: >60 mL/min (ref >60)
GFR calc non Af Amer: >60 mL/min (ref >60)
Glucose, Bld: 112 mg/dL — ABNORMAL HIGH (ref 70–99)
Potassium: 3.7 mmol/L (ref 3.5–5.1)
Sodium: 136 mmol/L (ref 135–145)

## 2018-11-24 LAB — CBC WITH DIFFERENTIAL/PLATELET
Abs Immature Granulocytes: 0.01 10*3/uL (ref 0.00–0.07)
Basophils Absolute: 0 10*3/uL (ref 0.0–0.1)
Basophils Relative: 1 %
Eosinophils Absolute: 0.1 10*3/uL (ref 0.0–0.5)
Eosinophils Relative: 2 %
HCT: 34.9 % — ABNORMAL LOW (ref 39.0–52.0)
Hemoglobin: 12 g/dL — ABNORMAL LOW (ref 13.0–17.0)
Immature Granulocytes: 0 %
Lymphocytes Relative: 46 %
Lymphs Abs: 2.2 10*3/uL (ref 0.7–4.0)
MCH: 31.6 pg (ref 26.0–34.0)
MCHC: 34.4 g/dL (ref 30.0–36.0)
MCV: 91.8 fL (ref 80.0–100.0)
Monocytes Absolute: 0.5 10*3/uL (ref 0.1–1.0)
Monocytes Relative: 9 %
Neutro Abs: 2 10*3/uL (ref 1.7–7.7)
Neutrophils Relative %: 42 %
Platelets: 112 10*3/uL — ABNORMAL LOW (ref 150–400)
RBC: 3.8 MIL/uL — ABNORMAL LOW (ref 4.22–5.81)
RDW: 14.5 % (ref 11.5–15.5)
WBC: 4.8 10*3/uL (ref 4.0–10.5)
nRBC: 0 % (ref 0.0–0.2)

## 2018-11-24 LAB — GLUCOSE, CAPILLARY
Glucose-Capillary: 193 mg/dL — ABNORMAL HIGH (ref 70–99)
Glucose-Capillary: 135 mg/dL — ABNORMAL HIGH (ref 70–99)
Glucose-Capillary: 171 mg/dL — ABNORMAL HIGH (ref 70–99)
Glucose-Capillary: 213 mg/dL — ABNORMAL HIGH (ref 70–99)

## 2018-11-24 LAB — MAGNESIUM: Magnesium: 1.5 mg/dL — ABNORMAL LOW (ref 1.7–2.4)

## 2018-11-24 MED ORDER — MAGNESIUM SULFATE 2 GM/50ML IV SOLN
2.0000 g | Freq: Once | INTRAVENOUS | Status: AC
  Administered 2018-11-24: 11:00:00 2 g via INTRAVENOUS
  Filled 2018-11-24: qty 50

## 2018-11-24 NOTE — Progress Notes (Addendum)
Patient ID: Juan Juarez, male   DOB: May 20, 1956, 63 y.o.   MRN: [id number]  PROGRESS NOTE    Antonio Creswell  [id number]:[id number] DOB: [birth year] DOA: 11/05/2018 PCP: Glenda Chroman, MD   Brief Narrative:  63 year old male with history of hypertension, dyslipidemia, hepatitis C, diabetes mellitus type 2, SDH status post craniotomy in 2019, recent I&D with biopsy of the left forearm on 10/24/2018 by Dr. Burney Gauze with subsequent cultures positive for MRSA presented with worsening left arm pain and drainage on 11/05/2018.  He was subsequently found to have MRSA left elbow septic arthritis and osteomyelitis.  He underwent repeat I&D on 11/06/2018 by Dr. Burney Gauze.  Given prior history of drug use, not a candidate for outpatient IV antimicrobial therapy and will remain on IV vancomycin till 11/27/2018 as per ID recommendations.  Assessment & Plan:   Principal Problem:   Infected elbow ([place]) Active Problems:   Diabetes mellitus with hyperglycemia (HCC)   Thrombocytopenia (HCC)   Tobacco abuse   Chronic hepatitis C without hepatic coma (HCC)  MRSA septic arthritis of the left elbow and osteomyelitis of the distal humerus and proximal radial head -Underwent I&D on 11/06/2018.  Wound care and dressing changes as per Dr. Bertis Ruddy recommendation.  Prior hospitalist had spoken with Dr. Burney Gauze on 11/13/2018 over the phone and subsequently dressing change was done on 11/14/2018 by his RN.  Spoke to Dr. Bertis Ruddy nurse on 11/20/2018 on phone who stated that she would evaluate the patient and look at the wound and dressing.  This is pending.  Try to call Dr. Bertis Ruddy office today but the office is closed. -Continue IV vancomycin inpatient till 11/27/2018.  Given prior history of drug use, unfortunately not a candidate for outpatient IV antimicrobial therapy - will need Bactrim and rifampin for additional 3 weeks after completion of IV therapy -Have encouraged passive range of motion of wrist and elbow -Monitor labs  intermittently. -Has remained afebrile recently.  Diabetes mellitus type 2 uncontrolled with hyperglycemia -Continue Levemir.  Continue NovoLog with meals along with SSI  Hepatitis C with cirrhosis -Compensated.  Outpatient follow-up  Thrombocytopenia -Stable.  Monitor CBC periodically  Tobacco abuse -Counseled during the hospitalization.  Continue nicotine patch  Hypomagnesemia  -Replace.   DVT prophylaxis: Lovenox Code Status: Full Family Communication: None at bedside Disposition Plan: Remain inpatient till 11/27/2018  Consultants: Orthopedics/ID Procedures: I&D on 11/06/2018  Antimicrobials:  Vancomycin from 11/05/2018 onwards  Subjective: Patient seen and examined at bedside.  Still complains of left upper extremity swelling.  No worsening pain.  No overnight fever or vomiting.   Objective: Vitals:   11/23/18 0615 11/23/18 1508 11/23/18 2219 11/24/18 0559  BP: 119/74 100/65 123/78 130/74  Pulse: 75 74 68 82  Resp: 18 18 16 16   Temp: 98.3 F (36.8 C) 98.2 F (36.8 C) (!) 97.5 F (36.4 C) 98 F (36.7 C)  TempSrc: Oral Oral Oral Oral  SpO2: 100% 99% 99% 99%  Weight:      Height:        Intake/Output Summary (Last 24 hours) at 11/24/2018 0757 Last data filed at 11/24/2018 0412 Gross per 24 hour  Intake 640.83 ml  Output -  Net 640.83 ml   Filed Weights   11/19/18 0643 11/20/18 0432 11/21/18 0503  Weight: 74.2 kg 75 kg 76.2 kg    Examination:  General exam: No acute distress.  Looks older than stated age. Respiratory system: Bilateral decreased breath sounds at bases with some scattered crackles.   Cardiovascular system: S1-S2 heard,  rate controlled Gastrointestinal system: Abdomen is nondistended, soft and nontender. Normal bowel sounds heard. Extremities: No cyanosis.  Left elbow and forearm dressing present.  Left wrist appears swollen; distal neurovascular status is intact.    Data Reviewed: I have personally reviewed following labs and imaging  studies  CBC: Recent Labs  Lab 11/18/18 0430 11/21/18 0414 11/24/18 0414  WBC 4.5 4.1 4.8  NEUTROABS  --  1.8 2.0  HGB 11.2* 11.1* 12.0*  HCT 32.3* 31.5* 34.9*  MCV 92.8 91.6 91.8  PLT 131* 119* 161*   Basic Metabolic Panel: Recent Labs  Lab 11/18/18 0430 11/19/18 0347 11/21/18 0414 11/24/18 0414  NA 133*  --  136 136  K 3.8  --  3.9 3.7  CL 100  --  102 104  CO2 25  --  24 27  GLUCOSE 172*  --  157* 112*  BUN 18  --  23 17  CREATININE 0.79  --  0.74 0.79  CALCIUM 8.4*  --  8.8* 8.6*  MG 1.5* 1.7 1.5* 1.5*   GFR: Estimated Creatinine Clearance: 87.2 mL/min (by C-G formula based on SCr of 0.79 mg/dL). Liver Function Tests: No results for input(s): AST, ALT, ALKPHOS, BILITOT, PROT, ALBUMIN in the last 168 hours. No results for input(s): LIPASE, AMYLASE in the last 168 hours. No results for input(s): AMMONIA in the last 168 hours. Coagulation Profile: No results for input(s): INR, PROTIME in the last 168 hours. Cardiac Enzymes: No results for input(s): CKTOTAL, CKMB, CKMBINDEX, TROPONINI in the last 168 hours. BNP (last 3 results) No results for input(s): PROBNP in the last 8760 hours. HbA1C: No results for input(s): HGBA1C in the last 72 hours. CBG: Recent Labs  Lab 11/22/18 2101 11/23/18 0801 11/23/18 1225 11/23/18 1730 11/23/18 2215  GLUCAP 209* 147* 128* 161* 241*   Lipid Profile: No results for input(s): CHOL, HDL, LDLCALC, TRIG, CHOLHDL, LDLDIRECT in the last 72 hours. Thyroid Function Tests: No results for input(s): TSH, T4TOTAL, FREET4, T3FREE, THYROIDAB in the last 72 hours. Anemia Panel: No results for input(s): VITAMINB12, FOLATE, FERRITIN, TIBC, IRON, RETICCTPCT in the last 72 hours. Sepsis Labs: No results for input(s): PROCALCITON, LATICACIDVEN in the last 168 hours.  No results found for this or any previous visit (from the past 240 hour(s)).       Radiology Studies: No results found.      Scheduled Meds: . enoxaparin  (LOVENOX) injection  40 mg Subcutaneous Q24H  . insulin aspart  0-15 Units Subcutaneous TID WC  . insulin aspart  0-5 Units Subcutaneous QHS  . insulin aspart  7 Units Subcutaneous TID WC  . insulin detemir  50 Units Subcutaneous Daily  . multivitamin with minerals  1 tablet Oral Daily  . nicotine  21 mg Transdermal Daily  . Ensure Max Protein  11 oz Oral Daily  . senna-docusate  1 tablet Oral BID  . sodium chloride flush  10-40 mL Intracatheter Q12H   Continuous Infusions: . lactated ringers 10 mL/hr at 11/19/18 0309  . vancomycin 1,000 mg (11/24/18 0534)     LOS: 19 days        Aline August, MD [facility] 11/24/2018, 7:57 AM

## 2018-11-24 NOTE — Progress Notes (Signed)
L arm and hand very swollen pillows provided to keep elevated.  Sling in place. Patient stated Doctor told him to keep sling on in bed.

## 2018-11-25 ENCOUNTER — Inpatient Hospital Stay: Payer: Medicare Other | Admitting: Internal Medicine

## 2018-11-25 LAB — GLUCOSE, CAPILLARY
Glucose-Capillary: 149 mg/dL — ABNORMAL HIGH (ref 70–99)
Glucose-Capillary: 211 mg/dL — ABNORMAL HIGH (ref 70–99)
Glucose-Capillary: 234 mg/dL — ABNORMAL HIGH (ref 70–99)
Glucose-Capillary: 225 mg/dL — ABNORMAL HIGH (ref 70–99)

## 2018-11-25 NOTE — Progress Notes (Signed)
Patient examined at bedside and sutures removed  Hand swelling improved  No need for imaging at this time  Will need daily dry dressing changes and followup in my office next week

## 2018-11-25 NOTE — Progress Notes (Signed)
Nutrition Follow-up  RD working remotely.  DOCUMENTATION CODES:   Not applicable  INTERVENTION:   -Continue Ensure Max podaily, each supplement provides 150 kcal and 30 grams of protein. -Continue MVI with minerals daily  NUTRITION DIAGNOSIS:   Increased nutrient needs related to post-op healing as evidenced by estimated needs.  Ongoing  GOAL:   Patient will meet greater than or equal to 90% of their needs  Progressing  MONITOR:   PO intake, Supplement acceptance, Labs, Weight trends, Skin, I & O's  REASON FOR ASSESSMENT:   LOS    ASSESSMENT:   Juan Juarez is a 63 y.o. 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  Patient reports that he has been dealing issues on the left arm for at least 7 months.  Notes that he just recently had a I&D with biopsy performed by Dr. Burney Gauze on 4/24.  Pathology from biopsies taken noted chronic inflammatory tissue.  Cultures were positive for MRSA.  He followed up in his office yesterday where he reports that stitches were taken out, and was started on Bactrim.  He notes that he has been having these intermittent boils that come up and drain pus like fluid.  Denies having any significant fevers, but reports constant pain in the left with swelling.  Reports being unable to do much out of that arm due to pain.  Has been taking oxycodone at home for symptoms with some mild relief.  Followed up with infectious disease Dr. Prince Rome today and after talks with Dr. Burney Gauze recognition was recommended.  Plan for repeat I&D in a.m.  5/7- s/p I&D of lt elbow with application of antibiotic beads 5/9- PICC placed  Reviewed I/O's: +240 ml x 24 hours and +11.9 L since 11/11/18  Pt remains with good appetite. Per meal completion records, Po 100%. Pt is complaint with Ensure Max and MVI.  Pt  anticipated to have a prolonged hospitalization, as he requires close monitoring due to history of IV drug use with need for course of IV antibiotics. Per MD notes, plan for discharge on 11/27/18.   Labs reviewed: CBGS: [id number] (inpatient orders for glycemic control are 0-15 units insulin aspart TID with meals, 0-5 units insulin aspart q HS, 7 units insulin aspart TID with meals, and 50 units insulin determir daily).   Diet Order:   Diet Order            Diet heart healthy/carb modified Room service appropriate? Yes; Fluid consistency: Thin  Diet effective now              EDUCATION NEEDS:   No education needs have been identified at this time  Skin:  Skin Assessment: Skin Integrity Issues: Skin Integrity Issues:: Incisions Incisions: closed lt arm  Last BM:  11/24/18  Height:   Ht Readings from Last 1 Encounters:  11/06/18 5\' 7"  (1.702 m)    Weight:   Wt Readings from Last 1 Encounters:  11/21/18 76.2 kg    Ideal Body Weight:  67.3 kg  BMI:  Body mass index is 26.31 kg/m.  Estimated Nutritional Needs:   Kcal:  2000-2200  Protein:  100-115 grams  Fluid:  > 2 L    Ace Bergfeld A. Jimmye Norman, RD, LDN, [place] Registered Dietitian II Certified Diabetes Care and Education Specialist Pager: [phone] After hours Pager: [phone]

## 2018-11-25 NOTE — Progress Notes (Signed)
Patient ID: Juan Juarez, male   DOB: 23-Nov-1955, 63 y.o.   MRN: [id number]  PROGRESS NOTE    [name]  DEY:[id number] DOB: [birth year] DOA: 11/05/2018 PCP: Glenda Chroman, MD   Brief Narrative:  63 year old male with history of hypertension, dyslipidemia, hepatitis C, diabetes mellitus type 2, SDH status post craniotomy in 2019, recent I&D with biopsy of the left forearm on 10/24/2018 by Dr. Burney Gauze with subsequent cultures positive for MRSA presented with worsening left arm pain and drainage on 11/05/2018.  He was subsequently found to have MRSA left elbow septic arthritis and osteomyelitis.  He underwent repeat I&D on 11/06/2018 by Dr. Burney Gauze.  Given prior history of drug use, not a candidate for outpatient IV antimicrobial therapy and will remain on IV vancomycin till 11/27/2018 as per ID recommendations.  Assessment & Plan:   Principal Problem:   Infected elbow ([place]) Active Problems:   Diabetes mellitus with hyperglycemia (HCC)   Thrombocytopenia (HCC)   Tobacco abuse   Chronic hepatitis C without hepatic coma (HCC)  MRSA septic arthritis of the left elbow and osteomyelitis of the distal humerus and proximal radial head -Underwent I&D on 11/06/2018.  Wound care and dressing changes as per Dr. Bertis Ruddy recommendation.  Prior hospitalist had spoken with Dr. Burney Gauze on 11/13/2018 over the phone and subsequently dressing change was done on 11/14/2018 by his RN.  Spoke to Dr. Bertis Ruddy nurse on 11/20/2018 on phone who stated that she would evaluate the patient and look at the wound and dressing.  This is pending.  Tried to call Dr. Bertis Ruddy on 11/24/2018 but the office was closed.  Spoke with Dr. Burney Gauze on phone on 11/25/2018 and he will be evaluating the patient today with probable suture removal. -Continue IV vancomycin inpatient till 11/27/2018.  Given prior history of drug use, unfortunately not a candidate for outpatient IV antimicrobial therapy - will need Bactrim and rifampin for additional  3 weeks after completion of IV therapy -Have encouraged passive range of motion of wrist and elbow -Monitor labs intermittently. -Currently afebrile.  Diabetes mellitus type 2 uncontrolled with hyperglycemia -Continue Levemir.  Continue NovoLog with meals along with SSI  Hepatitis C with cirrhosis -Compensated.  Outpatient follow-up  Thrombocytopenia -Stable.  Monitor CBC periodically  Tobacco abuse -Counseled during the hospitalization.  Continue nicotine patch   DVT prophylaxis: Lovenox Code Status: Full Family Communication: None at bedside Disposition Plan: Remain inpatient till 11/27/2018  Consultants: Orthopedics/ID Procedures: I&D on 11/06/2018  Antimicrobials:  Vancomycin from 11/05/2018 onwards  Subjective: Patient seen and examined at bedside.  No worsening left upper extremity pain or swelling.  No overnight fever or vomiting.   Objective: Vitals:   11/24/18 0559 11/24/18 1445 11/24/18 2154 11/25/18 0541  BP: 130/74 112/72 140/89 (!) 144/84  Pulse: 82 80 71 71  Resp: 16 18 16 16   Temp: 98 F (36.7 C) 98.7 F (37.1 C) 98.2 F (36.8 C) 98.3 F (36.8 C)  TempSrc: Oral  Oral Oral  SpO2: 99% 98% 99% 96%  Weight:      Height:        Intake/Output Summary (Last 24 hours) at 11/25/2018 0739 Last data filed at 11/24/2018 2025 Gross per 24 hour  Intake 240 ml  Output -  Net 240 ml   Filed Weights   11/19/18 0643 11/20/18 0432 11/21/18 0503  Weight: 74.2 kg 75 kg 76.2 kg    Examination:  General exam: No distress.  Poor student.  Looks older than stated age. Respiratory system: Bilateral decreased  breath sounds at bases with some scattered crackles.  No wheezing Cardiovascular system: S1-S2 heard, rate controlled Gastrointestinal system: Abdomen is nondistended, soft and nontender. Normal bowel sounds heard. Extremities: No cyanosis.  Left elbow and forearm dressing present.  Left wrist appears swollen; distal neurovascular status is intact.    Data  Reviewed: I have personally reviewed following labs and imaging studies  CBC: Recent Labs  Lab 11/21/18 0414 11/24/18 0414  WBC 4.1 4.8  NEUTROABS 1.8 2.0  HGB 11.1* 12.0*  HCT 31.5* 34.9*  MCV 91.6 91.8  PLT 119* 993*   Basic Metabolic Panel: Recent Labs  Lab 11/19/18 0347 11/21/18 0414 11/24/18 0414  NA  --  136 136  K  --  3.9 3.7  CL  --  102 104  CO2  --  24 27  GLUCOSE  --  157* 112*  BUN  --  23 17  CREATININE  --  0.74 0.79  CALCIUM  --  8.8* 8.6*  MG 1.7 1.5* 1.5*   GFR: Estimated Creatinine Clearance: 87.2 mL/min (by C-G formula based on SCr of 0.79 mg/dL). Liver Function Tests: No results for input(s): AST, ALT, ALKPHOS, BILITOT, PROT, ALBUMIN in the last 168 hours. No results for input(s): LIPASE, AMYLASE in the last 168 hours. No results for input(s): AMMONIA in the last 168 hours. Coagulation Profile: No results for input(s): INR, PROTIME in the last 168 hours. Cardiac Enzymes: No results for input(s): CKTOTAL, CKMB, CKMBINDEX, TROPONINI in the last 168 hours. BNP (last 3 results) No results for input(s): PROBNP in the last 8760 hours. HbA1C: No results for input(s): HGBA1C in the last 72 hours. CBG: Recent Labs  Lab 11/23/18 2215 11/24/18 0803 11/24/18 1155 11/24/18 1744 11/24/18 2156  GLUCAP 241* 193* 135* 171* 213*   Lipid Profile: No results for input(s): CHOL, HDL, LDLCALC, TRIG, CHOLHDL, LDLDIRECT in the last 72 hours. Thyroid Function Tests: No results for input(s): TSH, T4TOTAL, FREET4, T3FREE, THYROIDAB in the last 72 hours. Anemia Panel: No results for input(s): VITAMINB12, FOLATE, FERRITIN, TIBC, IRON, RETICCTPCT in the last 72 hours. Sepsis Labs: No results for input(s): PROCALCITON, LATICACIDVEN in the last 168 hours.  No results found for this or any previous visit (from the past 240 hour(s)).       Radiology Studies: No results found.      Scheduled Meds: . enoxaparin (LOVENOX) injection  40 mg Subcutaneous Q24H   . insulin aspart  0-15 Units Subcutaneous TID WC  . insulin aspart  0-5 Units Subcutaneous QHS  . insulin aspart  7 Units Subcutaneous TID WC  . insulin detemir  50 Units Subcutaneous Daily  . multivitamin with minerals  1 tablet Oral Daily  . nicotine  21 mg Transdermal Daily  . Ensure Max Protein  11 oz Oral Daily  . senna-docusate  1 tablet Oral BID  . sodium chloride flush  10-40 mL Intracatheter Q12H   Continuous Infusions: . lactated ringers 10 mL/hr at 11/19/18 0309  . vancomycin 1,000 mg (11/25/18 0533)     LOS: 20 days        Aline August, MD [facility] 11/25/2018, 7:39 AM

## 2018-11-26 LAB — BASIC METABOLIC PANEL
Anion gap: 6 (ref 5–15)
BUN: 17 mg/dL (ref 8–23)
CO2: 24 mmol/L (ref 22–32)
Calcium: 8.6 mg/dL — ABNORMAL LOW (ref 8.9–10.3)
Chloride: 107 mmol/L (ref 98–111)
Creatinine, Ser: 0.78 mg/dL (ref 0.61–1.24)
GFR calc Af Amer: >60 mL/min (ref >60)
GFR calc non Af Amer: >60 mL/min (ref >60)
Glucose, Bld: 95 mg/dL (ref 70–99)
Potassium: 3.8 mmol/L (ref 3.5–5.1)
Sodium: 137 mmol/L (ref 135–145)

## 2018-11-26 LAB — CBC WITH DIFFERENTIAL/PLATELET
Abs Immature Granulocytes: 0 10*3/uL (ref 0.00–0.07)
Basophils Absolute: 0 10*3/uL (ref 0.0–0.1)
Basophils Relative: 0 %
Eosinophils Absolute: 0.1 10*3/uL (ref 0.0–0.5)
Eosinophils Relative: 2 %
HCT: 33.8 % — ABNORMAL LOW (ref 39.0–52.0)
Hemoglobin: 11.7 g/dL — ABNORMAL LOW (ref 13.0–17.0)
Immature Granulocytes: 0 %
Lymphocytes Relative: 43 %
Lymphs Abs: 2 10*3/uL (ref 0.7–4.0)
MCH: 31.8 pg (ref 26.0–34.0)
MCHC: 34.6 g/dL (ref 30.0–36.0)
MCV: 91.8 fL (ref 80.0–100.0)
Monocytes Absolute: 0.5 10*3/uL (ref 0.1–1.0)
Monocytes Relative: 11 %
Neutro Abs: 2 10*3/uL (ref 1.7–7.7)
Neutrophils Relative %: 44 %
Platelets: 122 10*3/uL — ABNORMAL LOW (ref 150–400)
RBC: 3.68 MIL/uL — ABNORMAL LOW (ref 4.22–5.81)
RDW: 14.3 % (ref 11.5–15.5)
WBC: 4.7 10*3/uL (ref 4.0–10.5)
nRBC: 0 % (ref 0.0–0.2)

## 2018-11-26 LAB — GLUCOSE, CAPILLARY
Glucose-Capillary: 206 mg/dL — ABNORMAL HIGH (ref 70–99)
Glucose-Capillary: 223 mg/dL — ABNORMAL HIGH (ref 70–99)
Glucose-Capillary: 97 mg/dL (ref 70–99)
Glucose-Capillary: 237 mg/dL — ABNORMAL HIGH (ref 70–99)

## 2018-11-26 LAB — MAGNESIUM: Magnesium: 1.5 mg/dL — ABNORMAL LOW (ref 1.7–2.4)

## 2018-11-26 NOTE — Progress Notes (Signed)
Patient ID: Juan Juarez, male   DOB: 1955/10/25, 63 y.o.   MRN: [id number]  PROGRESS NOTE    Juan Juarez  AST:[id number] DOB: 1955/10/26 DOA: 11/05/2018 PCP: Glenda Chroman, MD   Brief Narrative:  63 year old male with history of hypertension, dyslipidemia, hepatitis C, diabetes mellitus type 2, SDH status post craniotomy in 2019, recent I&D with biopsy of the left forearm on 10/24/2018 by Dr. Burney Gauze with subsequent cultures positive for MRSA presented with worsening left arm pain and drainage on 11/05/2018.  He was subsequently found to have MRSA left elbow septic arthritis and osteomyelitis.  He underwent repeat I&D on 11/06/2018 by Dr. Burney Gauze.  Given prior history of drug use, not a candidate for outpatient IV antimicrobial therapy and will remain on IV vancomycin till 11/27/2018 as per ID recommendations.  Assessment & Plan:   Principal Problem:   Infected elbow ([place]) Active Problems:   Diabetes mellitus with hyperglycemia (HCC)   Thrombocytopenia (HCC)   Tobacco abuse   Chronic hepatitis C without hepatic coma (HCC)  MRSA septic arthritis of the left elbow and osteomyelitis of the distal humerus and proximal radial head -Underwent I&D on 11/06/2018.  Wound care and dressing changes as per Dr. Bertis Ruddy recommendation.  Prior hospitalist had spoken with Dr. Burney Gauze on 11/13/2018 over the phone and subsequently dressing change was done on 11/14/2018 by his RN.  Spoke to Dr. Bertis Ruddy nurse on 11/20/2018 on phone who stated that she would evaluate the patient and look at the wound and dressing.  This is pending.  Tried to call Dr. Bertis Ruddy on 11/24/2018 but the office was closed.   -Suture removal on 11/25/2018 by Dr. Burney Gauze; plan is to complete IV antibiotic therapy with subsequent oral antibiotics at discharge. -Continue IV vancomycin inpatient till 11/27/2018.  Given prior history of drug use, unfortunately not a candidate for outpatient IV antimicrobial therapy -will need Bactrim and rifampin  for additional 3 weeks after completion of IV therapy. -Have encouraged passive range of motion of wrist and elbow -Monitor labs intermittently. -Currently afebrile.  Diabetes mellitus type 2 uncontrolled with hyperglycemia -Continue modified carbohydrate diet and follow CBGs; further adjust hypoglycemic regimen as needed. -Continue Levemir.   -Continue NovoLog with meals along with SSI  Hepatitis C with cirrhosis -Compensated.   -Continue gastroenterology outpatient follow-up  Thrombocytopenia -Stable.   -Monitor CBC periodically  Tobacco abuse -Counseled during the hospitalization.  Continue nicotine patch   DVT prophylaxis: Lovenox Code Status: Full Family Communication: None at bedside Disposition Plan: Remain inpatient till 11/27/2018 to complete inpatient vancomycin therapy; anticipated transition to oral Bactrim and rifampin to complete outpatient therapy.  Consultants: Orthopedics/ID Procedures: I&D on 11/06/2018  Antimicrobials:  Vancomycin from 11/05/2018 onwards  Subjective: Patient seen and examined.  In no acute distress.  Reports pain to be well controlled.  Afebrile, no nausea, no vomiting, no chest pain or shortness of breath.   Objective: Vitals:   11/25/18 2204 11/26/18 0518 11/26/18 0645 11/26/18 1323  BP: 136/90 129/68  (!) 150/77  Pulse: 68 71  75  Resp: 16 15  18   Temp: 98.2 F (36.8 C) 98.3 F (36.8 C)  98.1 F (36.7 C)  TempSrc: Oral Oral  Oral  SpO2: 100% 99%  97%  Weight:   73.4 kg   Height:       No intake or output data in the 24 hours ending 11/26/18 1547 Filed Weights   11/20/18 0432 11/21/18 0503 11/26/18 0645  Weight: 75 kg 76.2 kg 73.4 kg  Examination: General exam: Alert, awake, oriented x 3; afebrile, no chest pain, no shortness of breath, no acute distress.  No overnight events reported. Respiratory system: Clear to auscultation. Respiratory effort normal. Cardiovascular system:RRR. No murmurs, rubs, gallops.  Gastrointestinal system: Abdomen is nondistended, soft and nontender. No organomegaly or masses felt. Normal bowel sounds heard. Central nervous system: Alert and oriented. No focal neurological deficits. Extremities: No C/C/E, +pedal pulses Skin: No cyanosis, no clubbing.  Left forearm/elbow clean dressing present, reports pain is stable.  No cyanosis, no clubbing.  Stable neurovascular status. Psychiatry: Judgement and insight appear normal. Mood & affect appropriate.   Data Reviewed: I have personally reviewed following labs and imaging studies  CBC: Recent Labs  Lab 11/21/18 0414 11/24/18 0414 11/26/18 0355  WBC 4.1 4.8 4.7  NEUTROABS 1.8 2.0 2.0  HGB 11.1* 12.0* 11.7*  HCT 31.5* 34.9* 33.8*  MCV 91.6 91.8 91.8  PLT 119* 112* 885*   Basic Metabolic Panel: Recent Labs  Lab 11/21/18 0414 11/24/18 0414 11/26/18 0355  NA 136 136 137  K 3.9 3.7 3.8  CL 102 104 107  CO2 24 27 24   GLUCOSE 157* 112* 95  BUN 23 17 17   CREATININE 0.74 0.79 0.78  CALCIUM 8.8* 8.6* 8.6*  MG 1.5* 1.5* 1.5*   GFR: Estimated Creatinine Clearance: 87.2 mL/min (by C-G formula based on SCr of 0.78 mg/dL).  CBG: Recent Labs  Lab 11/25/18 1130 11/25/18 1707 11/25/18 2158 11/26/18 0812 11/26/18 1238  GLUCAP 211* 234* 225* 206* 223*    Radiology Studies: No results found.  Scheduled Meds: . enoxaparin (LOVENOX) injection  40 mg Subcutaneous Q24H  . insulin aspart  0-15 Units Subcutaneous TID WC  . insulin aspart  0-5 Units Subcutaneous QHS  . insulin aspart  7 Units Subcutaneous TID WC  . insulin detemir  50 Units Subcutaneous Daily  . multivitamin with minerals  1 tablet Oral Daily  . nicotine  21 mg Transdermal Daily  . Ensure Max Protein  11 oz Oral Daily  . senna-docusate  1 tablet Oral BID  . sodium chloride flush  10-40 mL Intracatheter Q12H   Continuous Infusions: . lactated ringers 10 mL/hr at 11/19/18 0309  . vancomycin 1,000 mg (11/26/18 0525)     LOS: 21 days     Barton Dubois, MD [phone]  [facility]  11/26/2018, 3:47 PM

## 2018-11-26 NOTE — Progress Notes (Signed)
Pharmacy Antibiotic Note  Juan Juarez is a 63 y.o. male admitted on 11/05/2018 with left elbow infection being admitted from ID clinic.  Pharmacy has been consulted for vancomycin dosing. Recent admit and I&D that grew MRSA; discharged on Bactrim DS.   Pt continues on Vancomycin with planned stop date of 5/28.  Per ortho, swelling improved and sutures removed.  Plan: - Continue Vancomycin to 1000 mg every 12 hours for estAUC 529 - Stop date in place for 11/27/18 - Plan for Bactrim + Rifampin at discharge.   Height: 5\' 7"  (170.2 cm) Weight: 161 lb 12.8 oz (73.4 kg) IBW/kg (Calculated) : 66.1  Temp (24hrs), Avg:98.3 F (36.8 C), Min:98.2 F (36.8 C), Max:98.5 F (36.9 C)  Recent Labs  Lab 11/21/18 0414 11/22/18 1901 11/23/18 0357 11/24/18 0414 11/26/18 0355  WBC 4.1  --   --  4.8 4.7  CREATININE 0.74  --   --  0.79 0.78  VANCOTROUGH  --   --  10*  --   --   VANCOPEAK  --  22*  --   --   --     Estimated Creatinine Clearance: 87.2 mL/min (by C-G formula based on SCr of 0.78 mg/dL).    No Known Allergies  Antimicrobials this admission: Vancomycin 5/6 >> (5/28)  Dose adjustments this admission: 5/9: dose decreased to 1500mg  IV q24h (VP 31, VT 14) 5/15: dose change to 750mg  IV q12h (VP 26, VT 8) 5/24: dose increased to 1000 mg IV every 12h (VP 22, VT 10)  Microbiology results: 4/24 left elbow joint 2/2: MRSA  5/6 BCx >> negative  [employer], Pharm.D., BCPS Clinical Pharmacist Clinical phone for 11/26/2018 from 8:30-4:00 is [phone].  **Pharmacist phone directory can now be found on [email or web address] ([email or web address]).  Listed under [place].  11/26/2018 11:48 AM

## 2018-11-26 NOTE — Progress Notes (Signed)
Inpatient Diabetes Program Recommendations  AACE/ADA: New Consensus Statement on Inpatient Glycemic Control (2015)  Target Ranges:  Prepandial:   less than 140 mg/dL      Peak postprandial:   less than 180 mg/dL (1-2 hours)      Critically ill patients:  140 - 180 mg/dL   Results for MAXIME, BECKNER (MRN [id number]) as of 11/26/2018 15:51  Ref. Range 11/25/2018 08:17 11/25/2018 11:30 11/25/2018 17:07 11/25/2018 21:58  Glucose-Capillary Latest Ref Range: 70 - 99 mg/dL 149 (H)  10 units NOVOLOG +  50 units LEVEMIR given at 9:33am  211 (H)  12 units NOVOLOG  234 (H)  12 units NOVOLOG  225 (H)  2 units NOVOLOG    Results for SEMIR, BRILL (MRN [id number]) as of 11/26/2018 15:51  Ref. Range 11/26/2018 08:12 11/26/2018 12:38  Glucose-Capillary Latest Ref Range: 70 - 99 mg/dL 206 (H)  12 units NOVOLOG +  50 units LEVEMIR given at 8:30am 223 (H)  12 units NOVOLOG      Home DM Meds: Levemir 40 units Daily       Metformin 500 mg BID       Glipizide 5 mg Daily   Current Orders: Levemir 50 units Daily      Novolog Moderate Correction Scale/ SSI (0-15 units) TID AC + HS      Novolog 7 units TID with meals     MD- Please consider the following in-hospital insulin adjustments:  1. Increase Levemir slightly to 52 units Daily  2. Increase Novolog Meal Coverage to: Novolog 10 units TID with meals  (Please add the following Hold Parameters: Hold if pt eats <50% of meal, Hold if pt NPO)      --Will follow patient during hospitalization--  Wyn Quaker RN, MSN, CDE Diabetes Coordinator Inpatient Glycemic Control Team Team Pager: [phone] (8a-5p)

## 2018-11-27 LAB — GLUCOSE, CAPILLARY: Glucose-Capillary: 138 mg/dL — ABNORMAL HIGH (ref 70–99)

## 2018-11-27 MED ORDER — SENNOSIDES-DOCUSATE SODIUM 8.6-50 MG PO TABS: 1.0000 | ORAL_TABLET | Freq: Two times a day (BID) | ORAL | 0 refills | Status: DC

## 2018-11-27 MED ORDER — POLYETHYLENE GLYCOL 3350 17 G PO PACK: 17.0000 g | PACK | Freq: Every day | ORAL | 0 refills | Status: DC | PRN

## 2018-11-27 MED ORDER — RIFAMPIN 300 MG PO CAPS: 300.0000 mg | ORAL_CAPSULE | Freq: Two times a day (BID) | ORAL | 0 refills | Status: AC

## 2018-11-27 MED ORDER — OXYCODONE-ACETAMINOPHEN 5-325 MG PO TABS: 1.0000 | ORAL_TABLET | Freq: Four times a day (QID) | ORAL | 0 refills | Status: AC | PRN

## 2018-11-27 MED ORDER — NICOTINE 21 MG/24HR TD PT24: 21.0000 mg | MEDICATED_PATCH | Freq: Every day | TRANSDERMAL | 1 refills | Status: DC

## 2018-11-27 MED ORDER — SULFAMETHOXAZOLE-TRIMETHOPRIM 800-160 MG PO TABS: 1.0000 | ORAL_TABLET | Freq: Two times a day (BID) | ORAL | 0 refills | Status: AC

## 2018-11-27 MED ORDER — ENSURE MAX PROTEIN PO LIQD: 11.0000 [oz_av] | Freq: Every day | ORAL | 0 refills | Status: DC

## 2018-11-27 MED FILL — rifAMPin 300 MG CAPS: 300 | 21 days supply | Qty: 42 | Fill #0

## 2018-11-27 MED FILL — OXYCODONE W/APAP 5/325 TAB: 5-325 | 4 days supply | Qty: 30 | Fill #0

## 2018-11-27 MED FILL — SENNA S 8.6-50 MG TABS: 8.6-50 | 20 days supply | Qty: 40 | Fill #0

## 2018-11-27 MED FILL — SULFAMETHOXAZOLE-TMP DS TAB: 800-160 | 21 days supply | Qty: 42 | Fill #0

## 2018-11-27 MED FILL — POLYETHYLENE GLYCOL 3350 PO: 17 | 28 days supply | Qty: 510 | Fill #0

## 2018-11-27 NOTE — Progress Notes (Signed)
Nsg Discharge Note  Admit Date:  11/05/2018 Discharge date: 11/27/2018   Claudia Pollock to be D/C'd Home per MD order.  AVS completed.  Copy for chart, and copy for patient signed, and dated. Patient/caregiver able to verbalize understanding.  Discharge Medication: Allergies as of 11/27/2018   No Known Allergies     Medication List    TAKE these medications   blood glucose meter kit and supplies Dispense based on patient and insurance preference. Use up to four times daily as directed. (FOR ICD-10 E10.9, E11.9).   Ensure Max Protein Liqd Take 330 mLs (11 oz total) by mouth daily.   glipiZIDE 5 MG tablet Commonly known as:  GLUCOTROL Take 5 mg by mouth daily.   Levemir 100 UNIT/ML injection Generic drug:  insulin detemir Inject 0.4 mLs (40 Units total) into the skin daily.   metFORMIN 500 MG tablet Commonly known as:  GLUCOPHAGE Take 500 mg by mouth 2 (two) times daily.   nicotine 21 mg/24hr patch Commonly known as:  NICODERM CQ - dosed in mg/24 hours Place 1 patch (21 mg total) onto the skin daily.   oxyCODONE-acetaminophen 5-325 MG tablet Commonly known as:  Percocet Take 1-2 tablets by mouth every 6 (six) hours as needed for up to 7 days for severe pain. What changed:    how much to take  when to take this   polyethylene glycol 17 g packet Commonly known as:  MIRALAX / GLYCOLAX Take 17 g by mouth daily as needed for moderate constipation.   rifampin 300 MG capsule Commonly known as:  Rifadin Take 1 capsule (300 mg total) by mouth 2 (two) times daily for 21 days.   senna-docusate 8.6-50 MG tablet Commonly known as:  Senokot-S Take 1 tablet by mouth 2 (two) times daily.   sulfamethoxazole-trimethoprim 800-160 MG tablet Commonly known as:  BACTRIM DS Take 1 tablet by mouth 2 (two) times a day for 21 days.   vancomycin  IVPB Inject 1,500 mg into the vein daily for 20 days. Indication:  Septic arthritis/OM Last Day of Therapy:  11/27/18 Labs - 'Sunday/Monday:   CBC/D, BMP, and vancomycin trough. Labs - Thursday:  BMP and vancomycin trough Labs - Every other week:  ESR and CRP            Home Infusion Instuctions  (From admission, onward)         Start     Ordered   11/08/18 0000  Home infusion instructions [facility] May follow [facility] Dosing Protocol; May administer Cathflo as needed to maintain patency of vascular access device.; Flushing of vascular access device: per [facility] Protocol: 0.9% NaCl pre/post medica...    Question Answer Comment  Instructions May follow [facility] Dosing Protocol   Instructions May administer Cathflo as needed to maintain patency of vascular access device.   Instructions Flushing of vascular access device: per [facility] Protocol: 0.9% NaCl pre/post medication administration and prn patency; Heparin 100 u/ml, 5ml for implanted ports and Heparin 10u/ml, 5ml for all other central venous catheters.   Instructions May follow [facility] Anaphylaxis Protocol for First Dose Administration in the home: 0.9% NaCl at 25-50 ml/hr to maintain IV access for protocol meds. Epinephrine 0.3 ml IV/IM PRN and Benadryl 25-50 IV/IM PRN s/s of anaphylaxis.   Instructions [facility] Infusion Coordinator (RN) to assist per patient IV care needs in the home PRN.      05'$ /09/20 2205          Discharge Assessment:  Vitals:   11/26/18 2119 11/27/18 0507  BP: 128/70 117/72  Pulse: 72 74  Resp:    Temp: 97.9 F (36.6 C) 98.2 F (36.8 C)  SpO2: 98% 96%   Skin clean, dry and intact without evidence of skin break down, no evidence of skin tears noted. IV catheter discontinued intact. Site without signs and symptoms of complications - no redness or edema noted at insertion site, patient denies c/o pain - only slight tenderness at site.  Dressing with slight pressure applied.  D/c Instructions-Education: Discharge instructions given to patient/family with verbalized understanding. D/c education completed with patient/family  including follow up instructions, medication list, d/c activities limitations if indicated, with other d/c instructions as indicated by MD - patient able to verbalize understanding, all questions fully answered. Patient instructed to return to ED, call 911, or call MD for any changes in condition.  PICC line pulled and RX brought from pharmacy to the patient.  Patient escorted via [place], and D/C home via private auto.  Salley Slaughter, MSN RN [facility] 11/27/2018 10:40 AM

## 2018-11-27 NOTE — Plan of Care (Signed)
  Problem: Education: Goal: Knowledge of General Education information will improve Description Including pain rating scale, medication(s)/side effects and non-pharmacologic comfort measures Outcome: Completed/Met   Problem: Health Behavior/Discharge Planning: Goal: Ability to manage health-related needs will improve Outcome: Completed/Met   Problem: Clinical Measurements: Goal: Ability to maintain clinical measurements within normal limits will improve Outcome: Completed/Met Goal: Will remain free from infection Outcome: Completed/Met Goal: Diagnostic test results will improve Outcome: Completed/Met Goal: Respiratory complications will improve Outcome: Completed/Met Goal: Cardiovascular complication will be avoided Outcome: Completed/Met   Problem: Nutrition: Goal: Adequate nutrition will be maintained Outcome: Completed/Met   Problem: Coping: Goal: Level of anxiety will decrease Outcome: Completed/Met   Problem: Pain Managment: Goal: General experience of comfort will improve Outcome: Completed/Met   Problem: Safety: Goal: Ability to remain free from injury will improve Outcome: Completed/Met   Problem: Skin Integrity: Goal: Risk for impaired skin integrity will decrease Outcome: Completed/Met

## 2018-11-27 NOTE — Discharge Summary (Signed)
Physician Discharge Summary  Juan Juarez SNK:[id number] DOB: 02-01-1956 DOA: 11/05/2018  PCP: Glenda Chroman, MD  Admit date: 11/05/2018 Discharge date: 11/27/2018  Time spent: 35 minutes  Recommendations for Outpatient Follow-up:  1. Repeat basic metabolic panel to follow electrolytes and renal function 2. Close follow-up the patient CBGs with further adjustment to hypoglycemic regimen as needed 3. Make sure patient has follow-up with infectious disease and orthopedic service as instructed.   Discharge Diagnoses:  Principal Problem:   Infected elbow ([place]) Active Problems:   Diabetes mellitus with hyperglycemia ([place])   Thrombocytopenia ([place])   Tobacco abuse   Chronic hepatitis C without hepatic coma (HCC)   Discharge Condition: Stable and improved.  Patient discharged home with home health nurse and instruction to follow-up with infectious disease and orthopedic service.  Diet recommendation: Modified carbohydrate diet.  Filed Weights   11/21/18 0503 11/26/18 0645 11/27/18 0500  Weight: 76.2 kg 73.4 kg 72.3 kg    History of present illness:  63 year old male with history of hypertension, dyslipidemia, hepatitis C, diabetes mellitus type 2, SDH status post craniotomy in 2019, recent I&D with biopsy of the left forearm on 10/24/2018 by Dr. Burney Gauze with subsequent cultures positive for MRSA presented with worsening left arm pain and drainage on 11/05/2018.  He was subsequently found to have MRSA left elbow septic arthritis and osteomyelitis.  He underwent repeat I&D on 11/06/2018 by Dr. Burney Gauze.  Given prior history of drug use, not a candidate for outpatient IV antimicrobial therapy and will remain on IV vancomycin till 11/27/2018 as per ID recommendations.  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  Wound care and dressing changes as per Dr. Bertis Ruddy recommendation.  -Suture  removal on 11/25/2018 by Dr. Burney Gauze; patient completed IV vancomycin therapy while inpatient on Nov 27, 2018. -Plan is now to be discharged on additional 3 weeks of Bactrim and rifampin. -Outpatient follow-up with orthopedic service and infectious disease service.   -Have encouraged passive range of motion of wrist and elbow -Monitor labs intermittently. -Currently afebrile and without signs of acute systemic infection..  Diabetes mellitus type 2 uncontrolled with hyperglycemia -Continue modified carbohydrate diet and follow CBGs; further adjust hypoglycemic regimen as needed. -resume Levemir and oral hypoglycemic agents.  Hepatitis C with cirrhosis -Compensated.   -Continue gastroenterology outpatient follow-up  Thrombocytopenia -Overall stable.   -Monitor CBC periodically to follow the stability of platelets count.  Tobacco abuse -Counseled during the hospitalization.   -Continue nicotine patch at discharge   Procedures: I&D on left elbow/forearm on 11/06/2018  Consultations:  Infectious disease  Orthopedic service  Discharge Exam: Vitals:   11/26/18 2119 11/27/18 0507  BP: 128/70 117/72  Pulse: 72 74  Resp:    Temp: 97.9 F (36.6 C) 98.2 F (36.8 C)  SpO2: 98% 96%   General exam: Alert, awake, oriented x 3; afebrile, no chest pain, no shortness of breath, no acute distress.  No overnight events reported. Respiratory system: Clear to auscultation. Respiratory effort normal. Cardiovascular system:RRR. No murmurs, rubs, gallops. Gastrointestinal system: Abdomen is nondistended, soft and nontender. No organomegaly or masses felt. Normal bowel sounds heard. Central nervous system: Alert and oriented. No focal neurological deficits. Extremities: No C/C/E, +pedal pulses Skin: No cyanosis, no clubbing.  Left forearm/elbow clean dressing present, reports pain is stable.  No cyanosis, no clubbing.  Stable neurovascular status. Psychiatry: Judgement and insight appear  normal. Mood & affect appropriate.  Discharge Instructions   Discharge Instructions    Diet Carb Modified   Complete by:  As directed    Discharge instructions   Complete by:  As directed    Maintain adequate hydration Take medications as prescribed Follow up with hand surgeon as instructed Follow modified carbohydrates diet Stop smoking   Home infusion instructions [facility] May follow [facility] Dosing Protocol; May administer Cathflo as needed to maintain patency of vascular access device.; Flushing of vascular access device: per [facility] Protocol: 0.9% NaCl pre/post medica...   Complete by:  As directed    Instructions:  May follow [place] Dosing Protocol   Instructions:  May administer Cathflo as needed to maintain patency of vascular access device.   Instructions:  Flushing of vascular access device: per [facility] Protocol: 0.9% NaCl pre/post medication administration and prn patency; Heparin 100 u/ml, 65m for implanted ports and Heparin 10u/ml, 529mfor all other central venous catheters.   Instructions:  May follow [facility] Anaphylaxis Protocol for First Dose Administration in the home: 0.9% NaCl at 25-50 ml/hr to maintain IV access for protocol meds. Epinephrine 0.3 ml IV/IM PRN and Benadryl 25-50 IV/IM PRN s/s of anaphylaxis.   Instructions:  [facility] Coordinator (RN) to assist per patient IV care needs in the home PRN.     Allergies as of 11/27/2018   No Known Allergies     Medication List    TAKE these medications   blood glucose meter kit and supplies Dispense based on patient and insurance preference. Use up to four times daily as directed. (FOR ICD-10 E10.9, E11.9).   Ensure Max Protein Liqd Take 330 mLs (11 oz total) by mouth daily.   glipiZIDE 5 MG tablet Commonly known as:  GLUCOTROL Take 5 mg by mouth daily.   Levemir 100 UNIT/ML injection Generic drug:  insulin detemir Inject 0.4 mLs (40 Units total) into the skin daily.   metFORMIN  500 MG tablet Commonly known as:  GLUCOPHAGE Take 500 mg by mouth 2 (two) times daily.   nicotine 21 mg/24hr patch Commonly known as:  NICODERM CQ - dosed in mg/24 hours Place 1 patch (21 mg total) onto the skin daily.   oxyCODONE-acetaminophen 5-325 MG tablet Commonly known as:  Percocet Take 1-2 tablets by mouth every 6 (six) hours as needed for up to 7 days for severe pain. What changed:    how much to take  when to take this   polyethylene glycol 17 g packet Commonly known as:  MIRALAX / GLYCOLAX Take 17 g by mouth daily as needed for moderate constipation.   rifampin 300 MG capsule Commonly known as:  Rifadin Take 1 capsule (300 mg total) by mouth 2 (two) times daily for 21 days.   senna-docusate 8.6-50 MG tablet Commonly known as:  Senokot-S Take 1 tablet by mouth 2 (two) times daily.   sulfamethoxazole-trimethoprim 800-160 MG tablet Commonly known as:  BACTRIM DS Take 1 tablet by mouth 2 (two) times a day for 21 days.   vancomycin  IVPB Inject 1,500 mg into the vein daily for 20 days. Indication:  Septic arthritis/OM Last Day of Therapy:  11/27/18 Labs - Sunday/Monday:  CBC/D, BMP, and vancomycin trough. Labs - Thursday:  BMP and vancomycin trough Labs - Every other week:  ESR and CRP            Home Infusion Instuctions  (From admission, onward)         Start  Ordered   11/08/18 0000  Home infusion instructions [facility] May follow [place] Dosing Protocol; May administer Cathflo as needed to maintain patency of vascular access device.; Flushing of vascular access device: per [facility] Protocol: 0.9% NaCl pre/post medica...    Question Answer Comment  Instructions May follow [place] Dosing Protocol   Instructions May administer Cathflo as needed to maintain patency of vascular access device.   Instructions Flushing of vascular access device: per [facility] Protocol: 0.9% NaCl pre/post medication administration and prn patency; Heparin 100 u/ml,  67m for implanted ports and Heparin 10u/ml, 571mfor all other central venous catheters.   Instructions May follow [facility] Anaphylaxis Protocol for First Dose Administration in the home: 0.9% NaCl at 25-50 ml/hr to maintain IV access for protocol meds. Epinephrine 0.3 ml IV/IM PRN and Benadryl 25-50 IV/IM PRN s/s of anaphylaxis.   Instructions [facility] Infusion Coordinator (RN) to assist per patient IV care needs in the home PRN.      11/08/18 2205         No Known Allergies Follow-up Information    WeCharlotte CrumbMD Follow up on 12/04/2018.   Specialty:  Orthopedic Surgery Why:  For wound re-check Contact information: [phone]        CoThayer HeadingsMD Follow up on 12/18/2018.   Specialty:  Infectious Diseases Why:  contact office for appointment details. Contact information: [address] [phone]          The results of significant diagnostics from this hospitalization (including imaging, microbiology, ancillary and laboratory) are listed below for reference.    Significant Diagnostic Studies: Dg Elbow Complete Left (3+view)  Result Date: 11/05/2018 CLINICAL DATA:  Elbow infection EXAM: LEFT ELBOW - COMPLETE 3+ VIEW COMPARISON:  09/27/2018 FINDINGS: In the interval, there has been erosion in bone destruction involving the radial head, portions of the lateral humeral condyle and proximal ulna likely related to septic arthritis and osteomyelitis. Elbow joint effusion. Diffuse soft tissue swelling. No dislocation. IMPRESSION: Bone destruction/erosion involving the distal lateral humeral condyle, proximal ulna and radial head compatible with osteomyelitis and likely septic arthritis. Large joint effusion and soft tissue swelling. Electronically Signed   By: KeRolm Baptise.D.   On: 11/05/2018 19:12   UsKoreakg Site Rite  Result Date: 11/07/2018 If Site Rite image not attached, placement  could not be confirmed due to current cardiac rhythm.  Labs: Basic Metabolic Panel: Recent Labs  Lab 11/21/18 0414 11/24/18 0414 11/26/18 0355  NA 136 136 137  K 3.9 3.7 3.8  CL 102 104 107  CO2 _0 GLUCOSE 157* 112* 95  BUN _1 CREATININE 0.74 0.79 0.78  CALCIUM 8.8* 8.6* 8.6*  MG 1.5* 1.5* 1.5*   CBC: Recent Labs  Lab 11/21/18 0414 11/24/18 0414 11/26/18 0355  WBC 4.1 4.8 4.7  NEUTROABS 1.8 2.0 2.0  HGB 11.1* 12.0* 11.7*  HCT 31.5* 34.9* 33.8*  MCV 91.6 91.8 91.8  PLT 119* 112* 122*    CBG: Recent Labs  Lab 11/26/18 0812 11/26/18 1238 11/26/18 1633 11/26/18 2125 11/27/18 0700  GLUCAP 206* 223* 97 237* 138*     Signed:  CaBarton DuboisD.  [facility] 11/27/2018, 8:02 AM

## 2018-11-27 NOTE — TOC Transition Note (Addendum)
Transition of Care [facility]) - CM/SW Discharge Note   Patient Details  Name: Juan Juarez MRN: [id number] Date of Birth: 03-25-1956  [facility] CM/SW Contact:  Juan Mons, RN Phone Number: 11/27/2018, 9:25 AM   Clinical Narrative:    Pt will transition to home today with home health services ( RN) provided by [facility], liaison made aware of d/c plan. Pt will d/c to mom's home : [address] [place] [address].  Juan Juarez (Mother) Pt's cell # [phone]    [phone]      [place] will deliver Rx meds to bedside prior to discharge. Mom to provide transportation to home.  Final next level of care: [place] Barriers to Discharge: Barriers Resolved   Patient Goals and CMS Choice Patient states their goals for this hospitalization and ongoing recovery are:: to go home  CMS Medicare.gov Compare Post Acute Care list provided to:: Patient Choice offered to / list presented to : Patient  Discharge Placement                       Discharge Plan and Services   Discharge Planning Services: CM Consult Post Acute Care Choice: Home Health          DME Arranged: N/A DME Agency: NA       HH services: RN [place] Agency: [place] Date [facility] Agency Contacted: 11/07/18 Time [place]: [phone] Representative spoke with at [place]: [place] ([place]) Interventions     Readmission Risk Interventions No flowsheet data found.

## 2018-11-29 DIAGNOSIS — G9341 Metabolic encephalopathy: Secondary | ICD-10-CM | POA: Diagnosis not present

## 2018-11-29 DIAGNOSIS — E871 Hypo-osmolality and hyponatremia: Secondary | ICD-10-CM | POA: Diagnosis not present

## 2018-11-29 DIAGNOSIS — M869 Osteomyelitis, unspecified: Secondary | ICD-10-CM | POA: Diagnosis not present

## 2018-11-29 DIAGNOSIS — E785 Hyperlipidemia, unspecified: Secondary | ICD-10-CM | POA: Diagnosis not present

## 2018-11-29 DIAGNOSIS — E860 Dehydration: Secondary | ICD-10-CM | POA: Diagnosis not present

## 2018-11-29 DIAGNOSIS — E1165 Type 2 diabetes mellitus with hyperglycemia: Secondary | ICD-10-CM | POA: Diagnosis not present

## 2018-11-29 DIAGNOSIS — M00022 Staphylococcal arthritis, left elbow: Secondary | ICD-10-CM | POA: Diagnosis not present

## 2018-11-29 DIAGNOSIS — M19022 Primary osteoarthritis, left elbow: Secondary | ICD-10-CM | POA: Diagnosis not present

## 2018-11-29 DIAGNOSIS — E1101 Type 2 diabetes mellitus with hyperosmolarity with coma: Secondary | ICD-10-CM | POA: Diagnosis not present

## 2018-11-29 DIAGNOSIS — G4700 Insomnia, unspecified: Secondary | ICD-10-CM | POA: Diagnosis not present

## 2018-11-29 DIAGNOSIS — E8809 Other disorders of plasma-protein metabolism, not elsewhere classified: Secondary | ICD-10-CM | POA: Diagnosis not present

## 2018-11-29 DIAGNOSIS — F419 Anxiety disorder, unspecified: Secondary | ICD-10-CM | POA: Diagnosis not present

## 2018-11-29 DIAGNOSIS — F329 Major depressive disorder, single episode, unspecified: Secondary | ICD-10-CM | POA: Diagnosis not present

## 2018-11-29 DIAGNOSIS — G40901 Epilepsy, unspecified, not intractable, with status epilepticus: Secondary | ICD-10-CM | POA: Diagnosis not present

## 2018-11-29 DIAGNOSIS — G8929 Other chronic pain: Secondary | ICD-10-CM | POA: Diagnosis not present

## 2018-11-29 DIAGNOSIS — B182 Chronic viral hepatitis C: Secondary | ICD-10-CM | POA: Diagnosis not present

## 2018-11-29 DIAGNOSIS — M48061 Spinal stenosis, lumbar region without neurogenic claudication: Secondary | ICD-10-CM | POA: Diagnosis not present

## 2018-11-29 DIAGNOSIS — E43 Unspecified severe protein-calorie malnutrition: Secondary | ICD-10-CM | POA: Diagnosis not present

## 2018-11-29 DIAGNOSIS — D539 Nutritional anemia, unspecified: Secondary | ICD-10-CM | POA: Diagnosis not present

## 2018-11-29 DIAGNOSIS — E1169 Type 2 diabetes mellitus with other specified complication: Secondary | ICD-10-CM | POA: Diagnosis not present

## 2018-11-29 DIAGNOSIS — I10 Essential (primary) hypertension: Secondary | ICD-10-CM | POA: Diagnosis not present

## 2018-11-29 DIAGNOSIS — M47812 Spondylosis without myelopathy or radiculopathy, cervical region: Secondary | ICD-10-CM | POA: Diagnosis not present

## 2018-11-29 DIAGNOSIS — M109 Gout, unspecified: Secondary | ICD-10-CM | POA: Diagnosis not present

## 2018-11-29 DIAGNOSIS — D696 Thrombocytopenia, unspecified: Secondary | ICD-10-CM | POA: Diagnosis not present

## 2018-11-29 DIAGNOSIS — B9562 Methicillin resistant Staphylococcus aureus infection as the cause of diseases classified elsewhere: Secondary | ICD-10-CM | POA: Diagnosis not present

## 2018-12-02 ENCOUNTER — Telehealth: Payer: Self-pay

## 2018-12-02 ENCOUNTER — Other Ambulatory Visit: Payer: Medicare Other

## 2018-12-02 DIAGNOSIS — B9562 Methicillin resistant Staphylococcus aureus infection as the cause of diseases classified elsewhere: Secondary | ICD-10-CM | POA: Diagnosis not present

## 2018-12-02 DIAGNOSIS — E1165 Type 2 diabetes mellitus with hyperglycemia: Secondary | ICD-10-CM | POA: Diagnosis not present

## 2018-12-02 DIAGNOSIS — Z20822 Contact with and (suspected) exposure to covid-19: Secondary | ICD-10-CM

## 2018-12-02 DIAGNOSIS — M00022 Staphylococcal arthritis, left elbow: Secondary | ICD-10-CM | POA: Diagnosis not present

## 2018-12-02 DIAGNOSIS — M869 Osteomyelitis, unspecified: Secondary | ICD-10-CM | POA: Diagnosis not present

## 2018-12-02 DIAGNOSIS — I10 Essential (primary) hypertension: Secondary | ICD-10-CM | POA: Diagnosis not present

## 2018-12-02 DIAGNOSIS — E1169 Type 2 diabetes mellitus with other specified complication: Secondary | ICD-10-CM | POA: Diagnosis not present

## 2018-12-02 NOTE — Telephone Encounter (Signed)
Phone call to pt.  Advised that he may have been exposed to COVID 19, during recent hospital stay. The pt. was discharged on 5/28. Offered to sched. Appt. For testing at the [facility] site.  Pt. Agreed to schedule.  Appt. Sched. At 1:30 PM, 6/3.  Verb. Understanding of instructions given.

## 2018-12-02 NOTE — Telephone Encounter (Signed)
Pt called back to reschedule. Appt for today at 330 secured.

## 2018-12-03 ENCOUNTER — Other Ambulatory Visit: Payer: Medicare Other

## 2018-12-03 DIAGNOSIS — Z299 Encounter for prophylactic measures, unspecified: Secondary | ICD-10-CM | POA: Diagnosis not present

## 2018-12-03 DIAGNOSIS — K7460 Unspecified cirrhosis of liver: Secondary | ICD-10-CM | POA: Diagnosis not present

## 2018-12-03 DIAGNOSIS — Z09 Encounter for follow-up examination after completed treatment for conditions other than malignant neoplasm: Secondary | ICD-10-CM | POA: Diagnosis not present

## 2018-12-03 DIAGNOSIS — E119 Type 2 diabetes mellitus without complications: Secondary | ICD-10-CM | POA: Diagnosis not present

## 2018-12-03 DIAGNOSIS — M869 Osteomyelitis, unspecified: Secondary | ICD-10-CM | POA: Diagnosis not present

## 2018-12-03 DIAGNOSIS — Z6825 Body mass index (BMI) 25.0-25.9, adult: Secondary | ICD-10-CM | POA: Diagnosis not present

## 2018-12-04 LAB — NOVEL CORONAVIRUS, NAA: SARS-CoV-2, NAA: NOT DETECTED

## 2018-12-05 DIAGNOSIS — E1169 Type 2 diabetes mellitus with other specified complication: Secondary | ICD-10-CM | POA: Diagnosis not present

## 2018-12-05 DIAGNOSIS — E1165 Type 2 diabetes mellitus with hyperglycemia: Secondary | ICD-10-CM | POA: Diagnosis not present

## 2018-12-05 DIAGNOSIS — M869 Osteomyelitis, unspecified: Secondary | ICD-10-CM | POA: Diagnosis not present

## 2018-12-05 DIAGNOSIS — M00022 Staphylococcal arthritis, left elbow: Secondary | ICD-10-CM | POA: Diagnosis not present

## 2018-12-05 DIAGNOSIS — B9562 Methicillin resistant Staphylococcus aureus infection as the cause of diseases classified elsewhere: Secondary | ICD-10-CM | POA: Diagnosis not present

## 2018-12-05 DIAGNOSIS — I10 Essential (primary) hypertension: Secondary | ICD-10-CM | POA: Diagnosis not present

## 2018-12-09 DIAGNOSIS — G8929 Other chronic pain: Secondary | ICD-10-CM | POA: Diagnosis not present

## 2018-12-09 DIAGNOSIS — M25522 Pain in left elbow: Secondary | ICD-10-CM | POA: Diagnosis not present

## 2018-12-10 DIAGNOSIS — M869 Osteomyelitis, unspecified: Secondary | ICD-10-CM | POA: Diagnosis not present

## 2018-12-10 DIAGNOSIS — B9562 Methicillin resistant Staphylococcus aureus infection as the cause of diseases classified elsewhere: Secondary | ICD-10-CM | POA: Diagnosis not present

## 2018-12-10 DIAGNOSIS — M00022 Staphylococcal arthritis, left elbow: Secondary | ICD-10-CM | POA: Diagnosis not present

## 2018-12-10 DIAGNOSIS — E1165 Type 2 diabetes mellitus with hyperglycemia: Secondary | ICD-10-CM | POA: Diagnosis not present

## 2018-12-10 DIAGNOSIS — E1169 Type 2 diabetes mellitus with other specified complication: Secondary | ICD-10-CM | POA: Diagnosis not present

## 2018-12-10 DIAGNOSIS — I10 Essential (primary) hypertension: Secondary | ICD-10-CM | POA: Diagnosis not present

## 2018-12-15 DIAGNOSIS — E119 Type 2 diabetes mellitus without complications: Secondary | ICD-10-CM | POA: Diagnosis not present

## 2018-12-15 DIAGNOSIS — F17210 Nicotine dependence, cigarettes, uncomplicated: Secondary | ICD-10-CM | POA: Diagnosis not present

## 2018-12-15 DIAGNOSIS — Z6826 Body mass index (BMI) 26.0-26.9, adult: Secondary | ICD-10-CM | POA: Diagnosis not present

## 2018-12-15 DIAGNOSIS — D696 Thrombocytopenia, unspecified: Secondary | ICD-10-CM | POA: Diagnosis not present

## 2018-12-15 DIAGNOSIS — Z299 Encounter for prophylactic measures, unspecified: Secondary | ICD-10-CM | POA: Diagnosis not present

## 2018-12-15 DIAGNOSIS — R569 Unspecified convulsions: Secondary | ICD-10-CM | POA: Diagnosis not present

## 2018-12-16 DIAGNOSIS — E1165 Type 2 diabetes mellitus with hyperglycemia: Secondary | ICD-10-CM | POA: Diagnosis not present

## 2018-12-16 DIAGNOSIS — I10 Essential (primary) hypertension: Secondary | ICD-10-CM | POA: Diagnosis not present

## 2018-12-16 DIAGNOSIS — M00022 Staphylococcal arthritis, left elbow: Secondary | ICD-10-CM | POA: Diagnosis not present

## 2018-12-16 DIAGNOSIS — M869 Osteomyelitis, unspecified: Secondary | ICD-10-CM | POA: Diagnosis not present

## 2018-12-16 DIAGNOSIS — B9562 Methicillin resistant Staphylococcus aureus infection as the cause of diseases classified elsewhere: Secondary | ICD-10-CM | POA: Diagnosis not present

## 2018-12-16 DIAGNOSIS — E1169 Type 2 diabetes mellitus with other specified complication: Secondary | ICD-10-CM | POA: Diagnosis not present

## 2018-12-17 ENCOUNTER — Other Ambulatory Visit: Payer: Self-pay

## 2018-12-17 ENCOUNTER — Ambulatory Visit (INDEPENDENT_AMBULATORY_CARE_PROVIDER_SITE_OTHER): Payer: Medicare Other | Admitting: Internal Medicine

## 2018-12-17 ENCOUNTER — Encounter: Payer: Self-pay | Admitting: Internal Medicine

## 2018-12-17 VITALS — BP 133/78 | HR 72 | Temp 97.8°F | Wt 166.0 lb

## 2018-12-17 DIAGNOSIS — M869 Osteomyelitis, unspecified: Secondary | ICD-10-CM | POA: Diagnosis not present

## 2018-12-17 DIAGNOSIS — K7030 Alcoholic cirrhosis of liver without ascites: Secondary | ICD-10-CM | POA: Diagnosis not present

## 2018-12-17 DIAGNOSIS — K7469 Other cirrhosis of liver: Secondary | ICD-10-CM

## 2018-12-17 DIAGNOSIS — B182 Chronic viral hepatitis C: Secondary | ICD-10-CM

## 2018-12-17 NOTE — Assessment & Plan Note (Signed)
I will repeat his hepatitis labs and get him evaluated for treatment.  He will be considered as a decompensated cirrhosis so will avoid protease inhibitors.   My preference is 24 weeks of Epclusa to avoid ribavirin.   He will follow up after labs

## 2018-12-17 NOTE — Assessment & Plan Note (Signed)
He has been on treatment 6 weeks, clinically seems to have resolved.  I will check a CRP and ESR and if relatively stable, no further treatment indicated.

## 2018-12-17 NOTE — Assessment & Plan Note (Signed)
Fortunately he quit drinking completely.

## 2018-12-17 NOTE — Assessment & Plan Note (Signed)
I will get him back to GI.

## 2018-12-17 NOTE — Progress Notes (Signed)
   Subjective:    Patient ID: Juan Juarez, male    DOB: 1955/08/17, 63 y.o.   MRN: [id number]  HPI He is here for HSFU He developed osteomyelitis and septic arthritis with MRSA of hs left elbow.  He was intially debrided by Dr. Burney Juarez on 4/24 with concern for infection vs other etiology and culture then grew out MRSA.  He was started on Bactrim initially and then sent here for evaluation by my partner, Dr. Prince Juarez.  In discussion with Dr. Burney [facility], he was admitted to the [facility] for further debridement which was done on 5/7.  Xray notable for bone destruction/erosion compatible with osteomyelitis and septic arthritis. I saw him and started him on IV vanocmycin for a projected 3 weeks at least, followed by oral therapy, depending on progress.  He did not return to see me until now though.  He did complete 3 weeks of IV vancomycin and then was transitioned to oral Bactrim + rifampin.  He tolerated this well.  He did see Dr. Burney Juarez recently and likely will need a joint replacement.  He is feeling well otherwise and completed his antibiotics today, which is 6 weeks.    He also has known chronic hepatitis C, genotype 1a, labs last done in 2018.  He had not pursued treatment.  He has been seen by Dr. Oneida Juarez in [place] and she was to do a screening EGD and colonoscopy but the patient did not want it at that time.  She was then going to send him here for treatment.  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  Last ultrasound for Juan Juarez screening was in December 2019.     Review of Systems  Constitutional: Negative for fatigue.  Gastrointestinal: Negative for diarrhea and nausea.  Skin: Negative for rash.       Objective:   Physical Exam Constitutional:      Appearance: Normal appearance.  Eyes:     General: No scleral icterus. Cardiovascular:     Rate and Rhythm: Regular rhythm.     Heart sounds: No murmur.  Pulmonary:     Effort: Pulmonary effort is normal.     Breath sounds: Normal breath  sounds.  Skin:    Findings: No rash.  Neurological:     Mental Status: He is alert.   SH: quit alcohol last year completely        Assessment & Plan:

## 2018-12-18 ENCOUNTER — Telehealth: Payer: Self-pay

## 2018-12-18 NOTE — Telephone Encounter (Signed)
Juan Juarez per Dr. Linus Salmons to schedule time to come in for a blood test added after he was seen yesterday 12/18/2018.   [name] is supposed to call back with a day next week that will work for him due to transportation issues.    [name], [place]

## 2018-12-22 ENCOUNTER — Other Ambulatory Visit: Payer: Self-pay

## 2018-12-22 ENCOUNTER — Other Ambulatory Visit: Payer: Medicare Other

## 2018-12-22 DIAGNOSIS — B182 Chronic viral hepatitis C: Secondary | ICD-10-CM

## 2018-12-23 ENCOUNTER — Ambulatory Visit (HOSPITAL_COMMUNITY)
Admission: RE | Admit: 2018-12-23 | Discharge: 2018-12-23 | Disposition: A | Discharge status: Home / Self Care | Payer: Medicare Other | Source: Ambulatory Visit | Attending: Internal Medicine | Admitting: Internal Medicine

## 2018-12-23 DIAGNOSIS — K7469 Other cirrhosis of liver: Secondary | ICD-10-CM | POA: Diagnosis not present

## 2018-12-23 DIAGNOSIS — K8020 Calculus of gallbladder without cholecystitis without obstruction: Secondary | ICD-10-CM | POA: Diagnosis not present

## 2018-12-24 LAB — HEPATITIS C RNA QUANTITATIVE
HCV Quantitative Log: 6.27 Log IU/mL — ABNORMAL HIGH
HCV RNA, PCR, QN: 1870000 IU/mL — ABNORMAL HIGH

## 2018-12-25 DIAGNOSIS — E1165 Type 2 diabetes mellitus with hyperglycemia: Secondary | ICD-10-CM | POA: Diagnosis not present

## 2018-12-25 DIAGNOSIS — I10 Essential (primary) hypertension: Secondary | ICD-10-CM | POA: Diagnosis not present

## 2018-12-25 DIAGNOSIS — M00022 Staphylococcal arthritis, left elbow: Secondary | ICD-10-CM | POA: Diagnosis not present

## 2018-12-25 DIAGNOSIS — M869 Osteomyelitis, unspecified: Secondary | ICD-10-CM | POA: Diagnosis not present

## 2018-12-25 DIAGNOSIS — B9562 Methicillin resistant Staphylococcus aureus infection as the cause of diseases classified elsewhere: Secondary | ICD-10-CM | POA: Diagnosis not present

## 2018-12-25 DIAGNOSIS — E1169 Type 2 diabetes mellitus with other specified complication: Secondary | ICD-10-CM | POA: Diagnosis not present

## 2018-12-25 LAB — CBC WITH DIFFERENTIAL/PLATELET
Absolute Monocytes: 572 cells/uL (ref 200–950)
Basophils Absolute: 47 cells/uL (ref 0–200)
Basophils Relative: 0.8 %
Eosinophils Absolute: 83 cells/uL (ref 15–500)
Eosinophils Relative: 1.4 %
HCT: 38.4 % — ABNORMAL LOW (ref 38.5–50.0)
Hemoglobin: 13.5 g/dL (ref 13.2–17.1)
Lymphs Abs: 1811 cells/uL (ref 850–3900)
MCH: 31.8 pg (ref 27.0–33.0)
MCHC: 35.2 g/dL (ref 32.0–36.0)
MCV: 90.4 fL (ref 80.0–100.0)
MPV: 9.6 fL (ref 7.5–12.5)
Monocytes Relative: 9.7 %
Neutro Abs: 3387 cells/uL (ref 1500–7800)
Neutrophils Relative %: 57.4 %
Platelets: 149 10*3/uL (ref 140–400)
RBC: 4.25 10*6/uL (ref 4.20–5.80)
RDW: 13.1 % (ref 11.0–15.0)
Total Lymphocyte: 30.7 %
WBC: 5.9 10*3/uL (ref 3.8–10.8)

## 2018-12-25 LAB — COMPLETE METABOLIC PANEL WITH GFR
AG Ratio: 0.6 (calc) — ABNORMAL LOW (ref 1.0–2.5)
ALT: 32 U/L (ref 9–46)
AST: 40 U/L — ABNORMAL HIGH (ref 10–35)
Albumin: 2.9 g/dL — ABNORMAL LOW (ref 3.6–5.1)
Alkaline phosphatase (APISO): 103 U/L (ref 35–144)
BUN: 16 mg/dL (ref 7–25)
CO2: 23 mmol/L (ref 20–32)
Calcium: 8.9 mg/dL (ref 8.6–10.3)
Chloride: 105 mmol/L (ref 98–110)
Creat: 1.01 mg/dL (ref 0.70–1.25)
GFR, Est African American: 91 mL/min/{1.73_m2} (ref >=60)
GFR, Est Non African American: 78 mL/min/{1.73_m2} (ref >=60)
Globulin: 4.5 g/dL (calc) — ABNORMAL HIGH (ref 1.9–3.7)
Glucose, Bld: 152 mg/dL — ABNORMAL HIGH (ref 65–99)
Potassium: 5.2 mmol/L (ref 3.5–5.3)
Sodium: 135 mmol/L (ref 135–146)
Total Bilirubin: 0.6 mg/dL (ref 0.2–1.2)
Total Protein: 7.4 g/dL (ref 6.1–8.1)

## 2018-12-25 LAB — C-REACTIVE PROTEIN: CRP: 0.5 mg/L (ref <8.0)

## 2018-12-25 LAB — HEPATITIS B CORE ANTIBODY, TOTAL: Hep B Core Total Ab: NONREACTIVE

## 2018-12-25 LAB — PROTIME-INR
INR: 1.1
Prothrombin Time: 10.9 s (ref 9.0–11.5)

## 2018-12-25 LAB — HEPATITIS C GENOTYPE

## 2018-12-25 LAB — HEPATITIS B SURFACE ANTIBODY,QUALITATIVE: Hep B S Ab: NONREACTIVE

## 2018-12-25 LAB — HEPATITIS A ANTIBODY, TOTAL: Hepatitis A AB,Total: REACTIVE — AB

## 2018-12-25 LAB — SEDIMENTATION RATE: Sed Rate: 53 mm/h — ABNORMAL HIGH (ref 0–20)

## 2018-12-25 LAB — HIV ANTIBODY (ROUTINE TESTING W REFLEX): HIV 1&2 Ab, 4th Generation: NONREACTIVE

## 2018-12-25 LAB — HEPATITIS B SURFACE ANTIGEN: Hepatitis B Surface Ag: NONREACTIVE

## 2018-12-29 DIAGNOSIS — B9562 Methicillin resistant Staphylococcus aureus infection as the cause of diseases classified elsewhere: Secondary | ICD-10-CM | POA: Diagnosis not present

## 2018-12-29 DIAGNOSIS — I10 Essential (primary) hypertension: Secondary | ICD-10-CM | POA: Diagnosis not present

## 2018-12-29 DIAGNOSIS — E1165 Type 2 diabetes mellitus with hyperglycemia: Secondary | ICD-10-CM | POA: Diagnosis not present

## 2018-12-29 DIAGNOSIS — E871 Hypo-osmolality and hyponatremia: Secondary | ICD-10-CM | POA: Diagnosis not present

## 2018-12-29 DIAGNOSIS — M109 Gout, unspecified: Secondary | ICD-10-CM | POA: Diagnosis not present

## 2018-12-29 DIAGNOSIS — G40901 Epilepsy, unspecified, not intractable, with status epilepticus: Secondary | ICD-10-CM | POA: Diagnosis not present

## 2018-12-29 DIAGNOSIS — E785 Hyperlipidemia, unspecified: Secondary | ICD-10-CM | POA: Diagnosis not present

## 2018-12-29 DIAGNOSIS — E1169 Type 2 diabetes mellitus with other specified complication: Secondary | ICD-10-CM | POA: Diagnosis not present

## 2018-12-29 DIAGNOSIS — M00022 Staphylococcal arthritis, left elbow: Secondary | ICD-10-CM | POA: Diagnosis not present

## 2018-12-29 DIAGNOSIS — G8929 Other chronic pain: Secondary | ICD-10-CM | POA: Diagnosis not present

## 2018-12-29 DIAGNOSIS — E860 Dehydration: Secondary | ICD-10-CM | POA: Diagnosis not present

## 2018-12-29 DIAGNOSIS — F329 Major depressive disorder, single episode, unspecified: Secondary | ICD-10-CM | POA: Diagnosis not present

## 2018-12-29 DIAGNOSIS — M869 Osteomyelitis, unspecified: Secondary | ICD-10-CM | POA: Diagnosis not present

## 2018-12-29 DIAGNOSIS — M48061 Spinal stenosis, lumbar region without neurogenic claudication: Secondary | ICD-10-CM | POA: Diagnosis not present

## 2018-12-29 DIAGNOSIS — E1101 Type 2 diabetes mellitus with hyperosmolarity with coma: Secondary | ICD-10-CM | POA: Diagnosis not present

## 2018-12-29 DIAGNOSIS — G9341 Metabolic encephalopathy: Secondary | ICD-10-CM | POA: Diagnosis not present

## 2018-12-29 DIAGNOSIS — M19022 Primary osteoarthritis, left elbow: Secondary | ICD-10-CM | POA: Diagnosis not present

## 2018-12-29 DIAGNOSIS — D696 Thrombocytopenia, unspecified: Secondary | ICD-10-CM | POA: Diagnosis not present

## 2018-12-29 DIAGNOSIS — E43 Unspecified severe protein-calorie malnutrition: Secondary | ICD-10-CM | POA: Diagnosis not present

## 2018-12-29 DIAGNOSIS — B182 Chronic viral hepatitis C: Secondary | ICD-10-CM | POA: Diagnosis not present

## 2018-12-29 DIAGNOSIS — M47812 Spondylosis without myelopathy or radiculopathy, cervical region: Secondary | ICD-10-CM | POA: Diagnosis not present

## 2018-12-29 DIAGNOSIS — E8809 Other disorders of plasma-protein metabolism, not elsewhere classified: Secondary | ICD-10-CM | POA: Diagnosis not present

## 2018-12-29 DIAGNOSIS — D539 Nutritional anemia, unspecified: Secondary | ICD-10-CM | POA: Diagnosis not present

## 2018-12-29 DIAGNOSIS — F419 Anxiety disorder, unspecified: Secondary | ICD-10-CM | POA: Diagnosis not present

## 2018-12-29 DIAGNOSIS — G4700 Insomnia, unspecified: Secondary | ICD-10-CM | POA: Diagnosis not present

## 2018-12-30 ENCOUNTER — Telehealth: Payer: Self-pay | Admitting: Internal Medicine

## 2018-12-30 DIAGNOSIS — G8929 Other chronic pain: Secondary | ICD-10-CM | POA: Diagnosis not present

## 2018-12-30 DIAGNOSIS — M25522 Pain in left elbow: Secondary | ICD-10-CM | POA: Diagnosis not present

## 2018-12-30 NOTE — Telephone Encounter (Signed)
COVID-19 Pre-Screening Questions: ° °Do you currently have a fever (>100 °F), chills or unexplained body aches? No  ° °Are you currently experiencing new cough, shortness of breath, sore throat, runny nose? No  °•  °Have you recently travelled outside the state of [place] in the last 14 days? No  °•  °1. Have you been in contact with someone that is currently pending confirmation of Covid19 testing or has been confirmed to have the Covid19 virus?  No  ° °

## 2018-12-31 ENCOUNTER — Ambulatory Visit: Payer: Medicare Other | Admitting: Internal Medicine

## 2019-01-01 ENCOUNTER — Telehealth: Payer: Self-pay | Admitting: Pharmacy Technician

## 2019-01-01 ENCOUNTER — Other Ambulatory Visit: Payer: Self-pay

## 2019-01-01 ENCOUNTER — Ambulatory Visit (INDEPENDENT_AMBULATORY_CARE_PROVIDER_SITE_OTHER): Payer: Medicare Other | Admitting: Internal Medicine

## 2019-01-01 ENCOUNTER — Encounter: Payer: Self-pay | Admitting: Internal Medicine

## 2019-01-01 VITALS — BP 132/78 | HR 72 | Temp 98.1°F | Wt 168.0 lb

## 2019-01-01 DIAGNOSIS — K7030 Alcoholic cirrhosis of liver without ascites: Secondary | ICD-10-CM | POA: Diagnosis not present

## 2019-01-01 DIAGNOSIS — Z23 Encounter for immunization: Secondary | ICD-10-CM | POA: Diagnosis not present

## 2019-01-01 DIAGNOSIS — M009 Pyogenic arthritis, unspecified: Secondary | ICD-10-CM | POA: Insufficient documentation

## 2019-01-01 DIAGNOSIS — B182 Chronic viral hepatitis C: Secondary | ICD-10-CM

## 2019-01-01 DIAGNOSIS — K7460 Unspecified cirrhosis of liver: Secondary | ICD-10-CM | POA: Diagnosis not present

## 2019-01-01 DIAGNOSIS — M00022 Staphylococcal arthritis, left elbow: Secondary | ICD-10-CM

## 2019-01-01 DIAGNOSIS — M869 Osteomyelitis, unspecified: Secondary | ICD-10-CM

## 2019-01-01 MED ORDER — SOFOSBUVIR-VELPATASVIR 400-100 MG PO TABS: 1.0000 | ORAL_TABLET | Freq: Every day | ORAL | 5 refills | Status: DC

## 2019-01-01 MED ORDER — DOXYCYCLINE MONOHYDRATE 100 MG PO TABS: 100.0000 mg | ORAL_TABLET | Freq: Two times a day (BID) | ORAL | 1 refills | Status: DC

## 2019-01-01 NOTE — Assessment & Plan Note (Signed)
I will try to get him on 24 weeks of Epclusa to avoid ribavirin and potential drug interaction, side effects.

## 2019-01-01 NOTE — Assessment & Plan Note (Signed)
Remains alcohol free.  

## 2019-01-01 NOTE — Assessment & Plan Note (Signed)
Has completed treatment.  Did have a recent xray by Dr. Burney Gauze but no report or film available for review at this time.  Note not yet available on CareEverywhere either.

## 2019-01-01 NOTE — Assessment & Plan Note (Signed)
Some increased swelling of his elbow, though no warmth.  Concern is further infection which may require debridement again but clinically does not appear infected (not hot).  I though will give him doxycycline to take and recheck his elbow in about 2 weeks with one of my partners. He knows if it worsens further to let us or Dr. Burney Gauze know.

## 2019-01-01 NOTE — Telephone Encounter (Addendum)
[facility] Patient Advocate Encounter   Received notification from [facility] that prior authorization for Juan Juarez is required.   PA submitted on 01/01/2019 Key [id number] Status is pending 1-3 days determination (p) [facility] will continue to follow.  Bartholomew Crews, CPhT Specialty Pharmacy Patient [facility] Phone: [phone] Fax: [phone] 01/01/2019 11:33 AM

## 2019-01-01 NOTE — Assessment & Plan Note (Signed)
Ultrasound good with no masses detected.  Will continue with [place] screening every 6 months.  He will need to get back to Dr. Oneida Alar for follow up, EGD.  Will discuss this with him when he returns in 2 weeks.

## 2019-01-01 NOTE — Addendum Note (Signed)
Addended by: Aundria Rud on: 01/01/2019 10:42 AM   Modules accepted: Orders

## 2019-01-01 NOTE — Progress Notes (Signed)
   Subjective:    Patient ID: Juan Juarez, male    DOB: 06-Jun-1956, 63 y.o.   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  He was intially debrided by Dr. Burney Juarez on 4/24 with concern for infection vs other etiology and culture then grew out MRSA.  He was started on Bactrim initially and then sent here for evaluation by my partner, Dr. Prince Juarez.  In discussion with Dr. Burney Juarez, he was admitted to the hospital for further debridement which was done on 5/7.  Xray notable for bone destruction/erosion compatible with osteomyelitis and septic arthritis. I saw him and started him on IV vanocmycin for a projected 3 weeks at least, followed by oral therapy, depending on progress.  He did not return to see me until now though.  He did complete 3 weeks of IV vancomycin and then was transitioned to oral Bactrim + rifampin.  He tolerated this well.   He complete 6 weeks of appropriate treatment and stopped with his elbow doing well.  He now though notes more swelling and pain in his elbow.  He saw Dr. Burney Juarez who reportedly felt the patient needs to stay on antibiotics, though note in [place] and xray done not yet available.    He also has known chronic hepatitis C, genotype 1a, labs last done in 2018.  He had not pursued treatment.  He has been seen by Dr. Oneida Juarez in [place] and she was to do a screening EGD and colonoscopy but the patient did not want it at that time.  She was then going to send him here for treatment.  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   Labs done now confirm the genotype 1a disease and active viral load.  Hepatitis B non-immune.      Review of Systems  Constitutional: Negative for fatigue.  Gastrointestinal: Negative for diarrhea and nausea.  Skin: Negative for rash.       Objective:   Physical Exam Constitutional:      Appearance: Normal appearance.  Eyes:     General: No scleral icterus.  Cardiovascular:     Rate and Rhythm: Regular rhythm.     Heart sounds: No murmur.  Pulmonary:     Effort: Pulmonary effort is normal.     Breath sounds: Normal breath sounds.  Musculoskeletal:     Comments: Elbow has increased edema and some serosanguinous drainage to it.  It though is not warm or tender.     Skin:    Findings: No rash.  Neurological:     Mental Status: He is alert.   SH: quit alcohol last year completely        Assessment & Plan:

## 2019-01-06 ENCOUNTER — Telehealth: Payer: Self-pay | Admitting: Pharmacist

## 2019-01-06 NOTE — Telephone Encounter (Signed)
[facility] Patient Advocate Encounter  Prior Authorization for Juan Juarez has been approved.    PA# [id number] Effective dates: 10/03/2018 through 06/18/2019  Patients co-pay is $8.95.   I was successful in securing patient an $30,000 grant from [employer] to provide copayment coverage. This will make the out of pocket cost $0.     The billing information is as follows and has been shared with [place].   Member ID: [id number] Group ID: [id number] RxBin: [id number] Dates of Eligibility: 12/07/2018 through 12/06/2019  I have spoken with the patient and we will mail the medication 07/08 to arrive 07/10. He will begin taking medication on the date of arrival and has a follow-up appointment on 07/16 with Terri Piedra.

## 2019-01-06 NOTE — Telephone Encounter (Addendum)
Patient is approved to receive Epclusa x 24 weeks for chronic Hepatitis C infection with decompensated cirrhosis. Counseled patient to take Epclusa daily with or without food. Encouraged patient not to miss any doses and explained how their chance of cure could go down with each dose missed. Counseled patient on what to do if dose is missed - if it is closer to the missed dose take immediately; if closer to next dose skip dose and take the next dose at the usual time.   Counseled patient on common side effects such as headache, fatigue, and nausea and that these normally decrease with time. I reviewed patient medications and found no drug interactions. Discussed with patient that there are several drug interactions including acid suppressants. Instructed patient to call clinic if he wishes to start a new medication during course of therapy. Also advised patient to call if he experiences any side effects. Patient will follow-up with Dr. Linus Salmons 8/19 at 10am.

## 2019-01-07 MED FILL — EPCLUSA 400 MG-100 MG TAB: 400-100 | 28 days supply | Qty: 28 | Fill #0

## 2019-01-14 ENCOUNTER — Telehealth: Payer: Self-pay | Admitting: Family

## 2019-01-14 NOTE — Telephone Encounter (Signed)
COVID-19 Pre-Screening Questions:01/14/19 ° ° °Do you currently have a fever (>100 °F), chills or unexplained body aches? NO ° °• Are you currently experiencing new cough, shortness of breath, sore throat, runny nose? NO °•  °Have you recently travelled outside the state of [place] in the last 14 days?NO °•  °Have you been in contact with someone that is currently pending confirmation of Covid19 testing or has been confirmed to have the Covid19 virus?  NO ° °**If the patient answers NO to ALL questions -  advise the patient to please call the clinic before coming to the office should any symptoms develop.  ° °

## 2019-01-15 ENCOUNTER — Encounter: Payer: Self-pay | Admitting: Family

## 2019-01-15 ENCOUNTER — Other Ambulatory Visit: Payer: Self-pay

## 2019-01-15 ENCOUNTER — Ambulatory Visit (INDEPENDENT_AMBULATORY_CARE_PROVIDER_SITE_OTHER): Payer: Medicare Other | Admitting: Family

## 2019-01-15 VITALS — BP 137/84 | HR 71 | Temp 98.3°F | Wt 166.0 lb

## 2019-01-15 DIAGNOSIS — A4902 Methicillin resistant Staphylococcus aureus infection, unspecified site: Secondary | ICD-10-CM

## 2019-01-15 DIAGNOSIS — M869 Osteomyelitis, unspecified: Secondary | ICD-10-CM | POA: Diagnosis not present

## 2019-01-15 NOTE — Patient Instructions (Signed)
Nice to see you.  We will check your inflammatory markers.   I am concern there may be infection still present with the warmth and swelling that may require additional surgery.  Continue the doxycycline for another 2 weeks then follow up with Dr. Linus Salmons.   Have a great day and stay safe!

## 2019-01-15 NOTE — Assessment & Plan Note (Addendum)
Mr. Montrose completed 3 weeks of IV therapy with vancomycin and transition to Bactrim for MRSA osteomyelitis of the elbow complicated by significant bony destruction of the distal humerus, radial head, and proximal ulna.  He continues to have pain and swelling with very mild warmth today.  Check inflammatory markers.  I have concerned that he may need additional surgical intervention to help resolve this infection.  Continue current dose of doxycycline for additional 2 weeks pending blood work results.  Follow-up in 2 weeks or sooner if needed.  Advised to continue to monitor symptoms and if worsened to inform Dr. Bertis Ruddy office.

## 2019-01-15 NOTE — Progress Notes (Signed)
Subjective:    Patient ID: Juan Juarez, male    DOB: 22-Apr-1956, 63 y.o.   MRN: [id number]  Chief Complaint  Patient presents with  . Osteomyelitis      HPI:  Juan Juarez is a 63 y.o. male with MRSA osteomyelitis of the left elbow s/p incision and drainage and completion of 3 weeks of IV therapy with vancomycin and transitioned to oral Bactrim and rifampin who was last seen in the office on 01/01/19 with continued swelling and pain in his elbow. He was started on doxycycline and recommended for follow up in 2 weeks. Juan Juarez has been seen by Dr. Burney Gauze on 6/30 with discussion of possible further surgical intervention once the infection has cleared.   Juan Juarez continues to take the doxycycline as previously prescribed with no adverse side effects or missed doses.  He continues to have pain in the left elbow with swelling.  Denies any systemic symptoms of fevers, chills, or sweats.  Did have initial drainage when last seen in the office which has since improved.   No Known Allergies    Outpatient Medications Prior to Visit  Medication Sig Dispense Refill  . blood glucose meter kit and supplies Dispense based on patient and insurance preference. Use up to four times daily as directed. (FOR ICD-10 E10.9, E11.9). 1 each 0  . doxycycline (ADOXA) 100 MG tablet Take 1 tablet (100 mg total) by mouth 2 (two) times daily. 60 tablet 1  . glipiZIDE (GLUCOTROL) 5 MG tablet Take 5 mg by mouth daily.     Marland Kitchen LEVEMIR 100 UNIT/ML injection Inject 0.4 mLs (40 Units total) into the skin daily. 10 mL 0  . metFORMIN (GLUCOPHAGE) 500 MG tablet Take 500 mg by mouth 2 (two) times daily.    . polyethylene glycol (MIRALAX / GLYCOLAX) 17 g packet Take 17 g by mouth daily as needed for moderate constipation. 28 each 0  . Sofosbuvir-Velpatasvir (EPCLUSA) 400-100 MG TABS Take 1 tablet by mouth daily. 28 tablet 5  . Ensure Max Protein (ENSURE MAX PROTEIN) LIQD Take 330 mLs (11 oz total) by mouth daily. (Patient  not taking: Reported on 01/01/2019) 330 mL 0  . nicotine (NICODERM CQ - DOSED IN MG/24 HOURS) 21 mg/24hr patch Place 1 patch (21 mg total) onto the skin daily. (Patient not taking: Reported on 01/01/2019) 28 patch 1  . senna-docusate (SENOKOT-S) 8.6-50 MG tablet Take 1 tablet by mouth 2 (two) times daily. (Patient not taking: Reported on 01/01/2019) 40 tablet 0   No facility-administered medications prior to visit.      Past Medical History:  Diagnosis Date  . Anxiety   . Chronic elbow pain, left   . Depression   . Diabetes mellitus, type II ([place])    type II  . Gout   . Head injury 12/2017  . Hepatitis C without hepatic coma   . Hypertension    denies  . Inflammatory arthropathy    left elbow  . Insomnia   . Non compliance w medication regimen   . Osteoarthritis   . Polysubstance abuse ([place])   . Protein-calorie malnutrition, severe ([place]) 09/13/2016  . Seizure ([place]) 12/2017   after head Injury  . Thrombocytopenia ([place])      Past Surgical History:  Procedure Laterality Date  . BURR HOLE Right 01/22/2018   Procedure: RIGHT BURR HOLES;  Surgeon: Newman Pies, MD;  Location: [facility];  Service: Neurosurgery;  Laterality: Right;  . I&D EXTREMITY Left 11/06/2018   Procedure:  IRRIGATION AND DEBRIDEMENT LEFT ELBOW JOINT;  Surgeon: Charlotte Crumb, MD;  Location: [facility];  Service: Orthopedics;  Laterality: Left;  . INCISION AND DRAINAGE Left 10/24/2018   Procedure: INCISION AND DRAINAGE LEFT ELBOW WITH MASS EXCISION;  Surgeon: Charlotte Crumb, MD;  Location: [facility];  Service: Orthopedics;  Laterality: Left;  Marland Kitchen MULTIPLE TOOTH EXTRACTIONS    . None         Review of Systems  Constitutional: Negative for chills, fatigue and fever.  Respiratory: Negative for chest tightness and shortness of breath.   Cardiovascular: Negative for chest pain.  Musculoskeletal:       Positive for left elbow pain      Objective:    BP 137/84   Pulse 71   Temp 98.3 F (36.8 C) (Oral)   Wt 166 lb  (75.3 kg)   BMI 26.00 kg/m  Nursing note and vital signs reviewed.  Physical Exam Constitutional:      General: He is not in acute distress.    Appearance: He is well-developed.     Comments: Seated in the chair; pleasant  Cardiovascular:     Rate and Rhythm: Normal rate and regular rhythm.     Heart sounds: Normal heart sounds.  Pulmonary:     Effort: Pulmonary effort is normal.     Breath sounds: Normal breath sounds.  Musculoskeletal:     Comments: Left elbow with mild warmth, tenderness, and palpable edema.  Range of motion limited secondary to discomfort and swelling.  Distal pulses and sensation intact and appropriate.  Skin:    General: Skin is warm and dry.  Neurological:     Mental Status: He is alert and oriented to person, place, and time.  Psychiatric:        Behavior: Behavior normal.        Thought Content: Thought content normal.        Judgment: Judgment normal.      Depression screen [facility] 2/9 01/15/2019 11/05/2018 08/15/2015 05/13/2015 10/06/2014  Decreased Interest 0 0 0 0 2  Down, Depressed, Hopeless 0 0 0 0 2  PHQ - 2 Score 0 0 0 0 4  Altered sleeping - - - - 3  Tired, decreased energy - - - - 3  Change in appetite - - - - 1  Feeling bad or failure about yourself  - - - - 1  Trouble concentrating - - - - 3  Moving slowly or fidgety/restless - - - - 1  Suicidal thoughts - - - - 0  PHQ-9 Score - - - - 16  Difficult doing work/chores - - - - Very difficult       Assessment & Plan:   Problem List Items Addressed This Visit      Musculoskeletal and Integument   Osteomyelitis of left elbow [facility])    [name] completed 3 weeks of IV therapy with vancomycin and transition to Bactrim for MRSA osteomyelitis of the elbow complicated by significant bony destruction of the distal humerus, radial head, and proximal ulna.  He continues to have pain and swelling with very mild warmth today.  Check inflammatory markers.  I have concerned that he may need additional  surgical intervention to help resolve this infection.  Continue current dose of doxycycline for additional 2 weeks pending blood work results.  Follow-up in 2 weeks or sooner if needed.  Advised to continue to monitor symptoms and if worsened to inform Dr. Bertis Ruddy office.      Relevant  Orders   C-reactive protein   Sedimentation rate    Other Visit Diagnoses    MRSA (methicillin resistant Staphylococcus aureus) infection    -  Primary   Relevant Orders   C-reactive protein   Sedimentation rate       I am having Claudia Pollock "[name]" maintain his glipiZIDE, Levemir, blood glucose meter kit and supplies, metFORMIN, nicotine, polyethylene glycol, senna-docusate, Ensure Max Protein, doxycycline, and Sofosbuvir-Velpatasvir.   Follow-up: Return in about 2 weeks (around 01/29/2019), or if symptoms worsen or fail to improve.   Terri Piedra, MSN, FNP-C Nurse Practitioner [facility] [place] number: [phone]

## 2019-01-16 LAB — C-REACTIVE PROTEIN: CRP: 0.7 mg/L (ref <8.0)

## 2019-01-16 LAB — SEDIMENTATION RATE: Sed Rate: 38 mm/h — ABNORMAL HIGH (ref 0–20)

## 2019-01-20 DIAGNOSIS — E1169 Type 2 diabetes mellitus with other specified complication: Secondary | ICD-10-CM | POA: Diagnosis not present

## 2019-01-20 DIAGNOSIS — I10 Essential (primary) hypertension: Secondary | ICD-10-CM | POA: Diagnosis not present

## 2019-01-20 DIAGNOSIS — M869 Osteomyelitis, unspecified: Secondary | ICD-10-CM | POA: Diagnosis not present

## 2019-01-20 DIAGNOSIS — M00022 Staphylococcal arthritis, left elbow: Secondary | ICD-10-CM | POA: Diagnosis not present

## 2019-01-20 DIAGNOSIS — B9562 Methicillin resistant Staphylococcus aureus infection as the cause of diseases classified elsewhere: Secondary | ICD-10-CM | POA: Diagnosis not present

## 2019-01-20 DIAGNOSIS — E1165 Type 2 diabetes mellitus with hyperglycemia: Secondary | ICD-10-CM | POA: Diagnosis not present

## 2019-01-29 MED FILL — EPCLUSA 400 MG-100 MG TAB: 400-100 | 28 days supply | Qty: 28 | Fill #1

## 2019-02-03 ENCOUNTER — Ambulatory Visit: Payer: Medicare Other | Admitting: Family

## 2019-02-04 ENCOUNTER — Telehealth: Payer: Self-pay | Admitting: Family

## 2019-02-04 NOTE — Telephone Encounter (Signed)
COVID-19 Pre-Screening Questions: ° °Do you currently have a fever (>100 °F), chills or unexplained body aches? N ° °Are you currently experiencing new cough, shortness of breath, sore throat, runny nose? N °•  °Have you recently travelled outside the state of [place] in the last 14 days? N  °•  °Have you been in contact with someone that is currently pending confirmation of Covid19 testing or has been confirmed to have the Covid19 virus?  N ° °**If the patient answers NO to ALL questions -  advise the patient to please call the clinic before coming to the office should any symptoms develop.  ° ° ° °

## 2019-02-05 ENCOUNTER — Ambulatory Visit: Payer: Medicare Other | Admitting: Family

## 2019-02-12 DIAGNOSIS — G8929 Other chronic pain: Secondary | ICD-10-CM | POA: Diagnosis not present

## 2019-02-12 DIAGNOSIS — M25522 Pain in left elbow: Secondary | ICD-10-CM | POA: Diagnosis not present

## 2019-02-12 DIAGNOSIS — M86622 Other chronic osteomyelitis, left  humerus: Secondary | ICD-10-CM | POA: Diagnosis not present

## 2019-02-18 ENCOUNTER — Encounter: Payer: Self-pay | Admitting: Internal Medicine

## 2019-02-18 ENCOUNTER — Ambulatory Visit (INDEPENDENT_AMBULATORY_CARE_PROVIDER_SITE_OTHER): Payer: Medicare Other | Admitting: Internal Medicine

## 2019-02-18 ENCOUNTER — Other Ambulatory Visit: Payer: Self-pay

## 2019-02-18 VITALS — BP 123/80 | HR 71 | Temp 98.2°F

## 2019-02-18 DIAGNOSIS — K7030 Alcoholic cirrhosis of liver without ascites: Secondary | ICD-10-CM | POA: Diagnosis not present

## 2019-02-18 DIAGNOSIS — M00022 Staphylococcal arthritis, left elbow: Secondary | ICD-10-CM | POA: Diagnosis not present

## 2019-02-18 DIAGNOSIS — B182 Chronic viral hepatitis C: Secondary | ICD-10-CM

## 2019-02-18 DIAGNOSIS — M869 Osteomyelitis, unspecified: Secondary | ICD-10-CM | POA: Diagnosis not present

## 2019-02-18 DIAGNOSIS — K7460 Unspecified cirrhosis of liver: Secondary | ICD-10-CM

## 2019-02-18 MED ORDER — DOXYCYCLINE MONOHYDRATE 100 MG PO TABS: 100.0000 mg | ORAL_TABLET | Freq: Two times a day (BID) | ORAL | 1 refills | Status: DC

## 2019-02-18 NOTE — Assessment & Plan Note (Signed)
No significant findings on exam.  Resolved.

## 2019-02-18 NOTE — Assessment & Plan Note (Signed)
On treatment and tolerating.  Will get early treatment response viral load today

## 2019-02-18 NOTE — Assessment & Plan Note (Signed)
No concerns on exam and recent xray results that I can surmise I think would be expected with destruction of the joint.  I will give him a refill of the doxycycline and recheck inflammatory markers.  If stable, he can stop the antibiotics next visit.

## 2019-02-18 NOTE — Progress Notes (Signed)
   Subjective:    Patient ID: Juan Juarez, male    DOB: March 07, 1956, 63 y.o.   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  He was intially debrided by Dr. Burney Gauze on 4/24 with concern for infection vs other etiology and culture then grew out MRSA.  He was started on Bactrim initially and then sent here for evaluation by my partner, Dr. Prince Rome.  In discussion with Dr. Burney Gauze, he was admitted to the hospital for further debridement which was done on 5/7.  Xray notable for bone destruction/erosion compatible with osteomyelitis and septic arthritis. I saw him and started him on IV vanocmycin for a projected 3 weeks at least, followed by oral therapy, depending on progress.  He did not return to see me until now though.  He did complete 3 weeks of IV vancomycin and then was transitioned to oral Bactrim + rifampin.  He tolerated this well.   He completed 6 weeks of appropriate treatment and stopped with his elbow doing well.  He now though notes more swelling and pain in his elbow.  He saw Dr. Burney Gauze who reportedly felt the patient needs to stay on antibiotics, though note in [place] and xray done not yet available.  He was put back on doxycycline.  He saw my partner, Terri Piedra in July and had doxycycline extended but did not return for follow up.  He recently saw Dr. Burney Gauze who had patient follow up with me here.  The patient reports it is still felt to be infected based on xray findings, though no xray report or image available to me.  Dr. Burney Gauze does note the continued destruction of the joint.  His inflammatory markers were reassurring in July with ESR of 38 and CRP wnl.    He also has known chronic hepatitis C, genotype 1a, labs last done in 2018.  I started him on Epclusa for 24 weeks, if approved, due to his Child Pugh B cirrhosis.      Review of Systems  Constitutional: Negative for fatigue.  Gastrointestinal:  Negative for diarrhea and nausea.  Skin: Negative for rash.       Objective:   Physical Exam Constitutional:      Appearance: Normal appearance.  Eyes:     General: No scleral icterus. Cardiovascular:     Rate and Rhythm: Regular rhythm.     Heart sounds: No murmur.  Pulmonary:     Effort: Pulmonary effort is normal.     Breath sounds: Normal breath sounds.  Musculoskeletal:     Comments: Closed incision, minimal warmth, some swelling.     Skin:    Findings: No rash.  Neurological:     Mental Status: He is alert.   SH: quit alcohol last year completely        Assessment & Plan:

## 2019-02-18 NOTE — Assessment & Plan Note (Signed)
Remains alcohol free.  

## 2019-02-18 NOTE — Assessment & Plan Note (Signed)
No mass noted on ultrasound.  Continue HCC screening every 6 months.

## 2019-02-19 ENCOUNTER — Ambulatory Visit: Payer: Medicare Other | Admitting: Internal Medicine

## 2019-02-21 LAB — COMPLETE METABOLIC PANEL WITH GFR
AG Ratio: 0.7 (calc) — ABNORMAL LOW (ref 1.0–2.5)
ALT: 12 U/L (ref 9–46)
AST: 17 U/L (ref 10–35)
Albumin: 3.1 g/dL — ABNORMAL LOW (ref 3.6–5.1)
Alkaline phosphatase (APISO): 78 U/L (ref 35–144)
BUN: 20 mg/dL (ref 7–25)
CO2: 26 mmol/L (ref 20–32)
Calcium: 9.2 mg/dL (ref 8.6–10.3)
Chloride: 101 mmol/L (ref 98–110)
Creat: 0.81 mg/dL (ref 0.70–1.25)
GFR, Est African American: 109 mL/min/{1.73_m2} (ref >=60)
GFR, Est Non African American: 94 mL/min/{1.73_m2} (ref >=60)
Globulin: 4.4 g/dL (calc) — ABNORMAL HIGH (ref 1.9–3.7)
Glucose, Bld: 385 mg/dL — ABNORMAL HIGH (ref 65–99)
Potassium: 4.5 mmol/L (ref 3.5–5.3)
Sodium: 132 mmol/L — ABNORMAL LOW (ref 135–146)
Total Bilirubin: 0.7 mg/dL (ref 0.2–1.2)
Total Protein: 7.5 g/dL (ref 6.1–8.1)

## 2019-02-21 LAB — HEPATITIS C RNA QUANTITATIVE
HCV Quantitative Log: <1.18 Log IU/mL — AB
HCV RNA, PCR, QN: <15 IU/mL — AB

## 2019-02-21 LAB — SEDIMENTATION RATE: Sed Rate: 51 mm/h — ABNORMAL HIGH (ref 0–20)

## 2019-02-21 LAB — C-REACTIVE PROTEIN: CRP: 0.7 mg/L (ref <8.0)

## 2019-02-24 IMAGING — CT CT HEAD W/O CM
4 of 8 series · 16 of 47 positions shown, 17 images · non-contrast
Comparison: 01/20/2018, 01/11/2018, 01/01/2018

CLINICAL DATA: Status epilepticus recent hospitalization for
subdural

EXAM:
CT HEAD WITHOUT CONTRAST
TECHNIQUE: Contiguous axial images were obtained from the base of the skull
through the vertex without intravenous contrast.

[Series 3: head wo · axial · 0.51mm/px · z∈[-215,-110]mm · 4 of 36 slices shown, 5 images]
[im 8/36  brain]
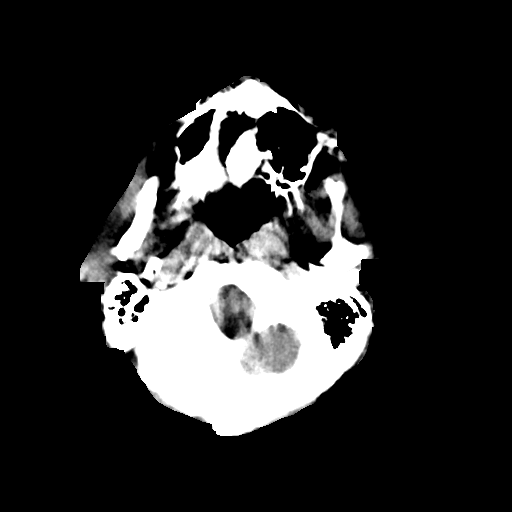
[im 8/36  bone]
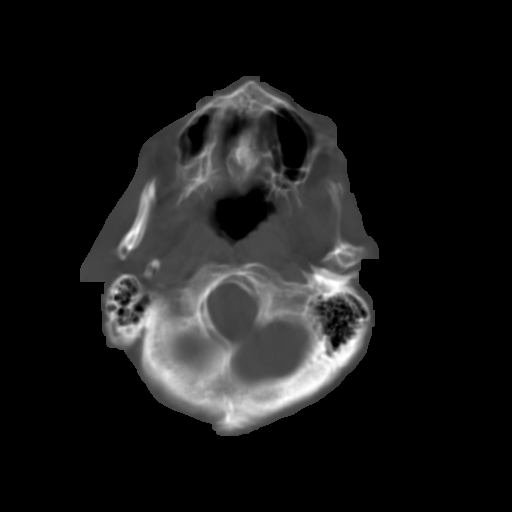
[im 15/36  brain]
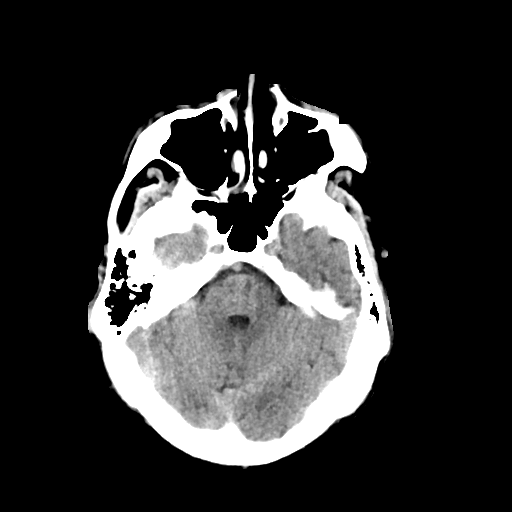
[im 22/36  brain]
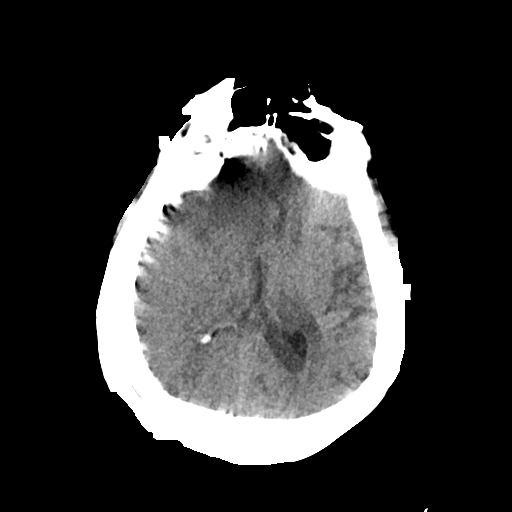
[im 29/36  brain]
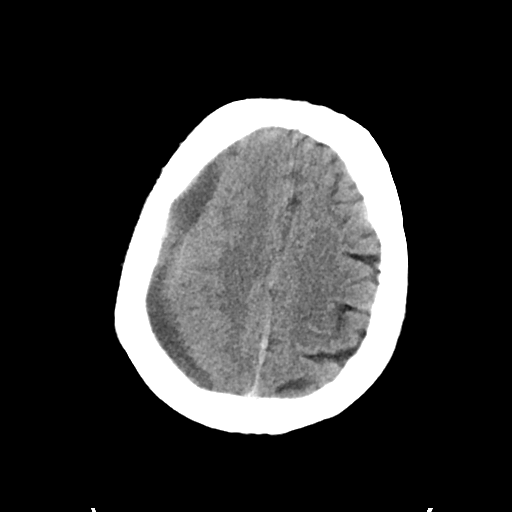

[Series 4: head bone · axial · 0.51mm/px · z∈[-238,-114]mm · 7 of 90 slices shown]
[im 7/90  bone]
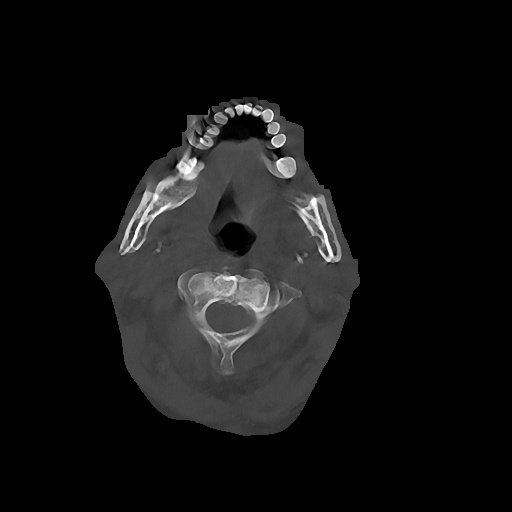
[im 21/90  bone]
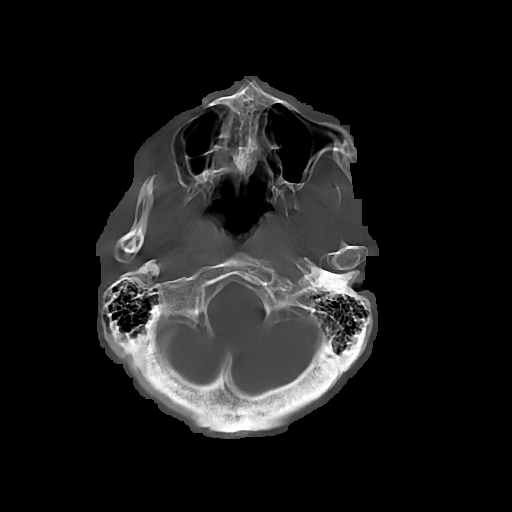
[im 28/90  bone]
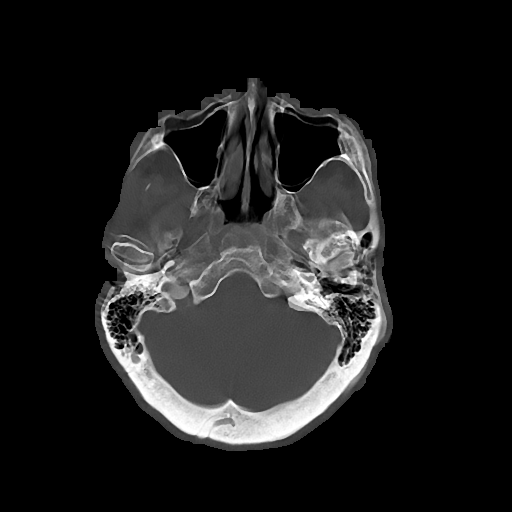
[im 42/90  bone]
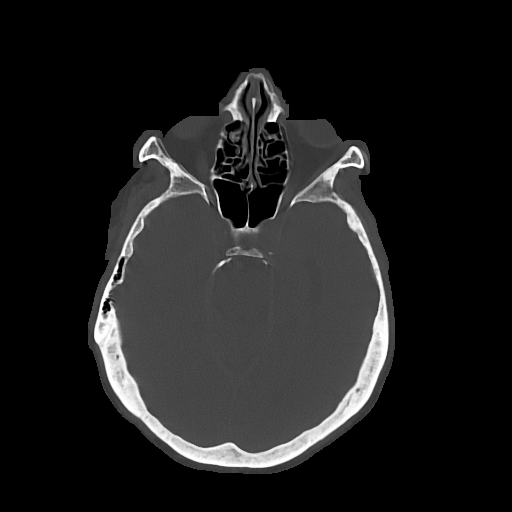
[im 48/90  bone]
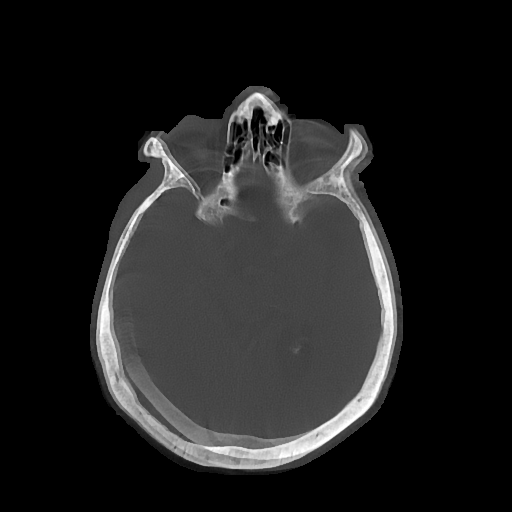
[im 62/90  bone]
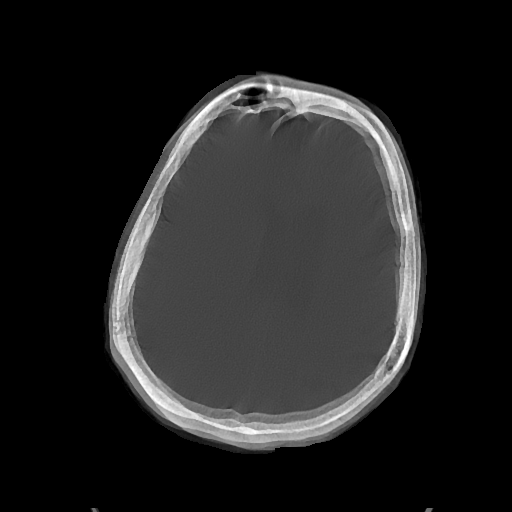
[im 69/90  bone]
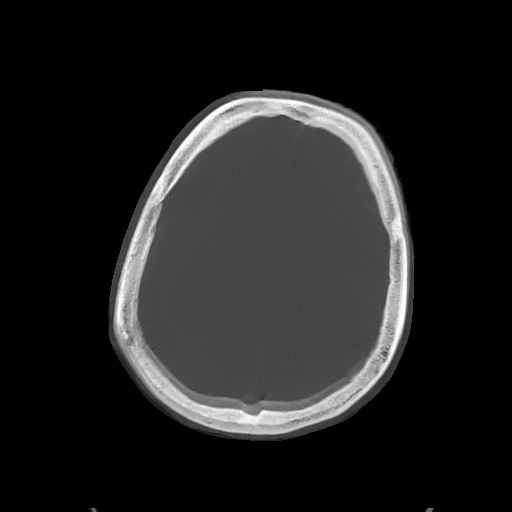

[Series 5: cor soft · coronal · 0.37mm/px · 3 of 69 slices shown]
[im 18/69  brain]
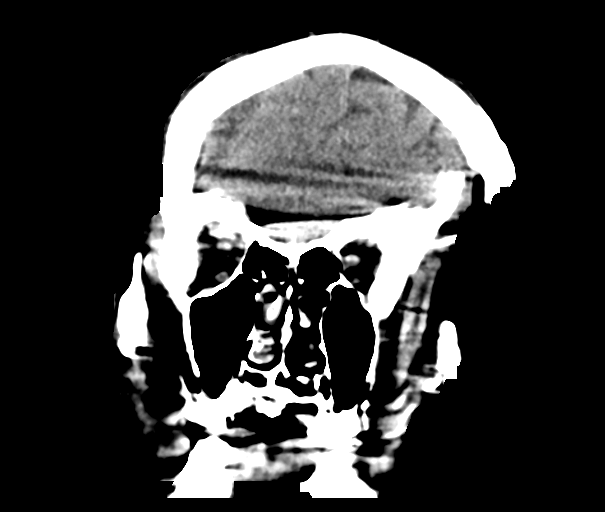
[im 35/69  brain]
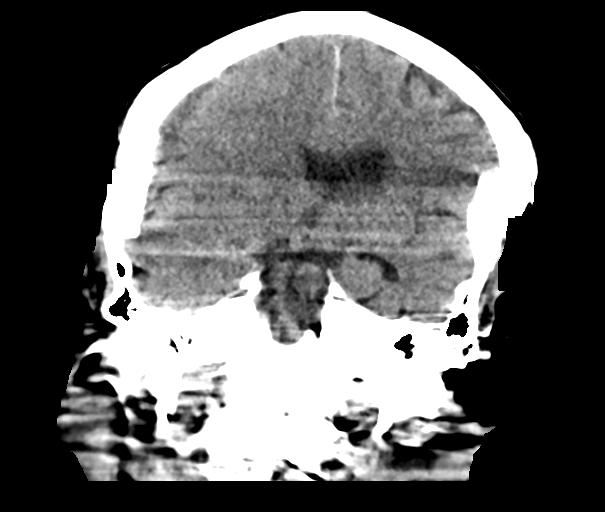
[im 52/69  brain]
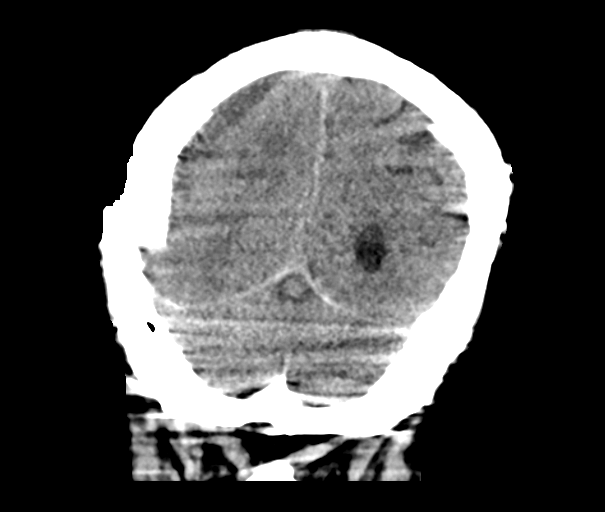

[Series 6: sag soft · sagittal · 0.35mm/px · 2 of 59 slices shown]
[im 20/59  brain]
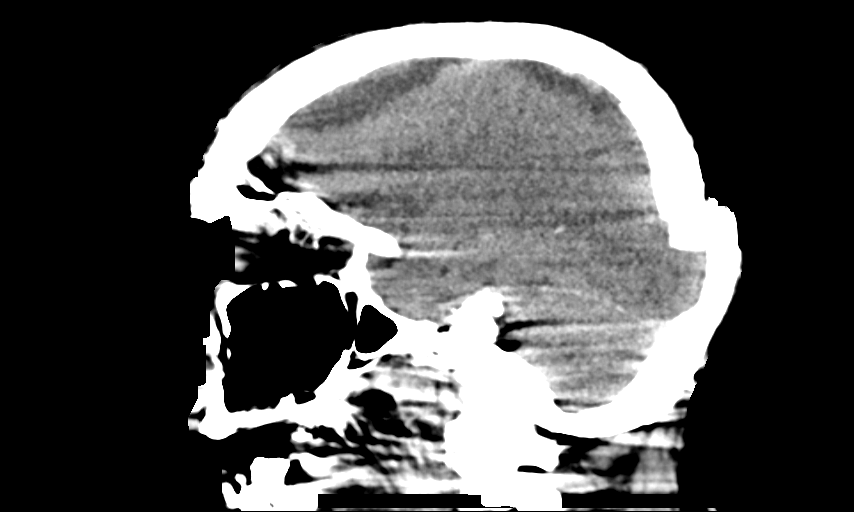
[im 39/59  brain]
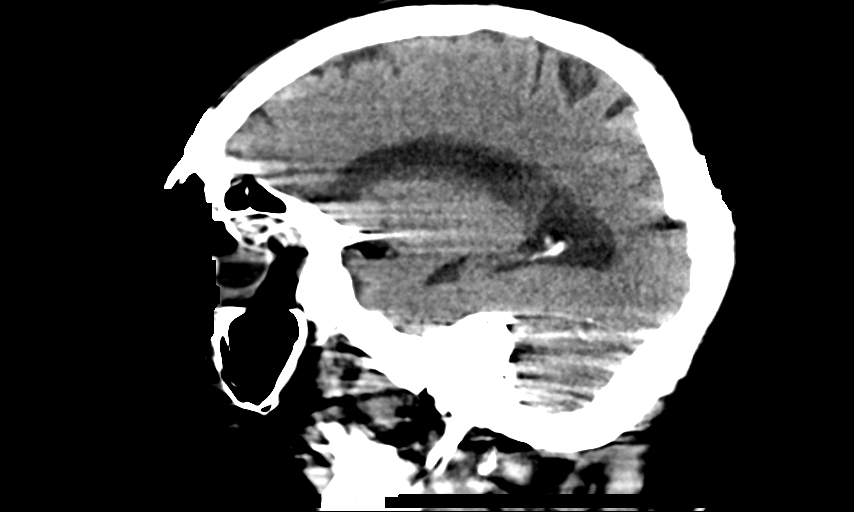

[16 of 47 positions shown; findings below may reference images not displayed]

FINDINGS: Brain: Motion degraded study. This limits the examination. Right
holo hemispheric mixed density subdural hematoma, grossly similar in
thickness, measuring 14 mm maximum on coronal images. Similar
appearance of 6 mm midline shift to the left. Mass effect on the
right lateral ventricle. Mild asymmetric enlargement of left lateral
ventricle. Generalized sulcal effacement on the right consistent
with edema. Basilar cisterns are patent. No mass is visualized.

Vascular: No hyperdense vessels.  Carotid vascular calcification

Skull: No fracture

Sinuses/Orbits: No acute finding.

Other: None
IMPRESSION: 1. Limited study secondary to motion degradation.
2. Known right complex holo hemispheric subdural hematoma on the
right is grossly similar in size compared to the most recent CT,
measuring up to 14 mm.. Similar degree of 6 mm midline shift to the
left with mass effect on the right lateral ventricle. Generalized
sulcal effacement/edema within the right hemisphere as before.

## 2019-02-25 MED FILL — EPCLUSA 400 MG-100 MG TAB: 400-100 | 28 days supply | Qty: 28 | Fill #2

## 2019-02-25 NOTE — Telephone Encounter (Signed)
Patient called this afternoon to request a refill of his medication. He currently has 7 tablets remaining and has not missed a dose since beginning treatment. The medication will be shipped out today to arrive by Friday, August 28. Patient's prior authorization should remain active the full 24-week course of treatment.

## 2019-03-16 DIAGNOSIS — M86622 Other chronic osteomyelitis, left  humerus: Secondary | ICD-10-CM | POA: Diagnosis not present

## 2019-03-16 DIAGNOSIS — M25522 Pain in left elbow: Secondary | ICD-10-CM | POA: Diagnosis not present

## 2019-03-16 DIAGNOSIS — G8929 Other chronic pain: Secondary | ICD-10-CM | POA: Diagnosis not present

## 2019-03-31 NOTE — Telephone Encounter (Signed)
[facility] Patient Advocate Encounter  Patient called this morning concerned that he only had one tablet remaining of his Epclusa. I scheduled the refill to be shipped out today and arrive tomorrow. He wants someone to call him and discuss what will happen if he misses one dose due to [employer] and to see if this medication will make his kidneys hurt. The medication being mailed out today will be his 4th refill going on week 13.

## 2019-04-01 DIAGNOSIS — E1165 Type 2 diabetes mellitus with hyperglycemia: Secondary | ICD-10-CM | POA: Diagnosis not present

## 2019-04-01 DIAGNOSIS — G5692 Unspecified mononeuropathy of left upper limb: Secondary | ICD-10-CM | POA: Diagnosis not present

## 2019-04-01 DIAGNOSIS — R569 Unspecified convulsions: Secondary | ICD-10-CM | POA: Diagnosis not present

## 2019-04-01 DIAGNOSIS — Z6829 Body mass index (BMI) 29.0-29.9, adult: Secondary | ICD-10-CM | POA: Diagnosis not present

## 2019-04-01 DIAGNOSIS — K7460 Unspecified cirrhosis of liver: Secondary | ICD-10-CM | POA: Diagnosis not present

## 2019-04-01 DIAGNOSIS — Z299 Encounter for prophylactic measures, unspecified: Secondary | ICD-10-CM | POA: Diagnosis not present

## 2019-04-01 DIAGNOSIS — F17210 Nicotine dependence, cigarettes, uncomplicated: Secondary | ICD-10-CM | POA: Diagnosis not present

## 2019-04-01 DIAGNOSIS — Z2821 Immunization not carried out because of patient refusal: Secondary | ICD-10-CM | POA: Diagnosis not present

## 2019-04-01 NOTE — Telephone Encounter (Signed)
Thanks for the follow-up. His medication is currently on the [employer] truck and set to be on time and delivered today by the end of the business day to his home address.

## 2019-04-01 NOTE — Telephone Encounter (Signed)
Awesome. Thanks Adonis Brook!

## 2019-04-01 NOTE — Telephone Encounter (Signed)
Called patient. He hopes that it will be delivered today via [employer]. He didn't mention anything about his kidneys or have any other questions.  I told him that it was ok if he missed today due to [employer] but to not double up and take one pill daily when it arrives.  Urged him not to miss any other doses if he has to miss one today,

## 2019-04-06 ENCOUNTER — Encounter: Payer: Self-pay | Admitting: Internal Medicine

## 2019-04-06 ENCOUNTER — Ambulatory Visit (INDEPENDENT_AMBULATORY_CARE_PROVIDER_SITE_OTHER): Payer: Medicare Other | Admitting: Internal Medicine

## 2019-04-06 ENCOUNTER — Other Ambulatory Visit: Payer: Self-pay

## 2019-04-06 VITALS — BP 127/76 | HR 71 | Temp 98.0°F

## 2019-04-06 DIAGNOSIS — B182 Chronic viral hepatitis C: Secondary | ICD-10-CM | POA: Diagnosis not present

## 2019-04-06 DIAGNOSIS — M869 Osteomyelitis, unspecified: Secondary | ICD-10-CM | POA: Diagnosis not present

## 2019-04-06 DIAGNOSIS — K7460 Unspecified cirrhosis of liver: Secondary | ICD-10-CM

## 2019-04-06 DIAGNOSIS — R52 Pain, unspecified: Secondary | ICD-10-CM | POA: Diagnosis not present

## 2019-04-06 DIAGNOSIS — F1910 Other psychoactive substance abuse, uncomplicated: Secondary | ICD-10-CM

## 2019-04-06 NOTE — Assessment & Plan Note (Addendum)
He has been off of the doxycycline now for about 1 week and no specific issues.  I discussed with him that stopping is the test of cure and at this point, one week out with no significant concerns, it appears to be stable from an infectious standpoint.  I will though recheck his inflammatory markers and if ESR not over 100, no further treatment indicated.   No follow up needed for this at this time.   ADDENDUM: inflammatory markers reassuring and no changes in plan as above.

## 2019-04-06 NOTE — Assessment & Plan Note (Signed)
Doing well on treatment and his early viral load is suppressed.   Continue Epclusa 24 weeks and rtc at that time and will repeat the viral load.

## 2019-04-06 NOTE — Assessment & Plan Note (Signed)
I discussed avoiding alcohol completely.

## 2019-04-06 NOTE — Progress Notes (Signed)
   Subjective:    Patient ID: Juan Juarez, male    DOB: 03-27-1956, 63 y.o.   MRN: [id number]  HPI He is here for [facility] He developed osteomyelitis and septic arthritis with MRSA of hs left elbow.  He was intially debrided by Dr. Burney Juarez on 4/24 with concern for infection vs other etiology and culture then grew out MRSA.  He was started on Bactrim initially and then sent here for evaluation by my partner, Dr. Prince Juarez.  In discussion with Dr. Burney Juarez, he was admitted to the hospital for further debridement which was done on 5/7.  Xray notable for bone destruction/erosion compatible with osteomyelitis and septic arthritis. I saw him and started him on IV vanocmycin for a projected 3 weeks at least, followed by oral therapy, depending on progress.  He did not return to see me until now though.  He did complete 3 weeks of IV vancomycin and then was transitioned to oral Bactrim + rifampin.  He tolerated this well.   He completed 6 weeks of appropriate treatment and stopped with his elbow doing well.  He now though notes more swelling and pain in his elbow.  He saw Dr. Burney Juarez who reportedly felt the patient needs to stay on antibiotics, though note in [place] and xray done not yet available.  He was put back on doxycycline.  He saw my partner, Juan Juarez in July and had doxycycline extended but did not return for follow up.   I then saw him in August on doxycycline and gave him another month refill.  His CRP was still wnl at 0.7 and ESR stable at 51.  His elbow is about the same with significant pain and he was started on gabapentin by his PCP but only took it once since he did not notice a difference.  He has had no fever and developed no rash. 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  No missed doses and is on his 4th bottle of 6.  Some headache initially but has been ok now.        Review of Systems   Constitutional: Negative for fatigue.  Gastrointestinal: Negative for diarrhea and nausea.  Skin: Negative for rash.       Objective:   Physical Exam Constitutional:      Appearance: Normal appearance.  Eyes:     General: No scleral icterus. Cardiovascular:     Rate and Rhythm: Regular rhythm.     Heart sounds: No murmur.  Pulmonary:     Effort: Pulmonary effort is normal.     Breath sounds: Normal breath sounds.  Musculoskeletal:     Comments: Minimal warmth with no erythema and no tenderness.      Skin:    Findings: No rash.  Neurological:     Mental Status: He is alert.  Psychiatric:        Mood and Affect: Mood normal.   SH: quit alcohol last year completely (though does endorse a beer every 6 months or so)        Assessment & Plan:

## 2019-04-06 NOTE — Assessment & Plan Note (Signed)
Will be due for [facility] screening at his next visit

## 2019-04-06 NOTE — Assessment & Plan Note (Signed)
I discussed that the gabapentin takes time to work and to keep taking it as prescribed by his PCP.

## 2019-04-07 LAB — SEDIMENTATION RATE: Sed Rate: 34 mm/h — ABNORMAL HIGH (ref 0–20)

## 2019-04-07 LAB — C-REACTIVE PROTEIN: CRP: 2.8 mg/L (ref <8.0)

## 2019-04-23 MED FILL — EPCLUSA 400 MG-100 MG TAB: 400-100 | 28 days supply | Qty: 28 | Fill #4

## 2019-04-29 DIAGNOSIS — G5602 Carpal tunnel syndrome, left upper limb: Secondary | ICD-10-CM | POA: Diagnosis not present

## 2019-05-05 DIAGNOSIS — G5602 Carpal tunnel syndrome, left upper limb: Secondary | ICD-10-CM | POA: Diagnosis not present

## 2019-05-05 DIAGNOSIS — G8929 Other chronic pain: Secondary | ICD-10-CM | POA: Diagnosis not present

## 2019-05-05 DIAGNOSIS — M25522 Pain in left elbow: Secondary | ICD-10-CM | POA: Diagnosis not present

## 2019-05-11 DIAGNOSIS — F331 Major depressive disorder, recurrent, moderate: Secondary | ICD-10-CM | POA: Diagnosis not present

## 2019-05-11 DIAGNOSIS — F411 Generalized anxiety disorder: Secondary | ICD-10-CM | POA: Diagnosis not present

## 2019-05-22 DIAGNOSIS — Z1211 Encounter for screening for malignant neoplasm of colon: Secondary | ICD-10-CM | POA: Diagnosis not present

## 2019-05-22 DIAGNOSIS — Z6829 Body mass index (BMI) 29.0-29.9, adult: Secondary | ICD-10-CM | POA: Diagnosis not present

## 2019-05-22 DIAGNOSIS — Z79899 Other long term (current) drug therapy: Secondary | ICD-10-CM | POA: Diagnosis not present

## 2019-05-22 DIAGNOSIS — Z1331 Encounter for screening for depression: Secondary | ICD-10-CM | POA: Diagnosis not present

## 2019-05-22 DIAGNOSIS — F419 Anxiety disorder, unspecified: Secondary | ICD-10-CM | POA: Diagnosis not present

## 2019-05-22 DIAGNOSIS — Z7189 Other specified counseling: Secondary | ICD-10-CM | POA: Diagnosis not present

## 2019-05-22 DIAGNOSIS — E1165 Type 2 diabetes mellitus with hyperglycemia: Secondary | ICD-10-CM | POA: Diagnosis not present

## 2019-05-22 DIAGNOSIS — Z1339 Encounter for screening examination for other mental health and behavioral disorders: Secondary | ICD-10-CM | POA: Diagnosis not present

## 2019-05-22 DIAGNOSIS — Z0000 Encounter for general adult medical examination without abnormal findings: Secondary | ICD-10-CM | POA: Diagnosis not present

## 2019-05-22 DIAGNOSIS — F17210 Nicotine dependence, cigarettes, uncomplicated: Secondary | ICD-10-CM | POA: Diagnosis not present

## 2019-05-22 DIAGNOSIS — E119 Type 2 diabetes mellitus without complications: Secondary | ICD-10-CM | POA: Diagnosis not present

## 2019-05-22 DIAGNOSIS — Z299 Encounter for prophylactic measures, unspecified: Secondary | ICD-10-CM | POA: Diagnosis not present

## 2019-05-22 DIAGNOSIS — Z125 Encounter for screening for malignant neoplasm of prostate: Secondary | ICD-10-CM | POA: Diagnosis not present

## 2019-05-22 DIAGNOSIS — K7460 Unspecified cirrhosis of liver: Secondary | ICD-10-CM | POA: Diagnosis not present

## 2019-05-22 DIAGNOSIS — I10 Essential (primary) hypertension: Secondary | ICD-10-CM | POA: Diagnosis not present

## 2019-05-22 MED FILL — EPCLUSA 400 MG-100 MG TAB: 400-100 | 28 days supply | Qty: 28 | Fill #5

## 2019-07-06 ENCOUNTER — Telehealth: Payer: Self-pay

## 2019-07-06 DIAGNOSIS — F331 Major depressive disorder, recurrent, moderate: Secondary | ICD-10-CM | POA: Diagnosis not present

## 2019-07-06 DIAGNOSIS — F411 Generalized anxiety disorder: Secondary | ICD-10-CM | POA: Diagnosis not present

## 2019-07-06 NOTE — Telephone Encounter (Signed)
COVID-19 Pre-Screening Questions:07/06/2019  Do you currently have a fever (>100 F), chills or unexplained body aches? NO   Are you currently experiencing new cough, shortness of breath, sore throat, runny nose?NO .  Have you recently travelled outside the state of [place] in the last 14 days? NO .  Have you been in contact with someone that is currently pending confirmation of Covid19 testing or has been confirmed to have the [place] virus?  NO  **If the patient answers NO to ALL questions -  advise the patient to please call the clinic before coming to the office should any symptoms develop.

## 2019-07-07 ENCOUNTER — Ambulatory Visit (INDEPENDENT_AMBULATORY_CARE_PROVIDER_SITE_OTHER): Payer: Medicare Other | Admitting: Internal Medicine

## 2019-07-07 ENCOUNTER — Other Ambulatory Visit: Payer: Self-pay

## 2019-07-07 ENCOUNTER — Encounter: Payer: Self-pay | Admitting: Internal Medicine

## 2019-07-07 VITALS — BP 161/93 | HR 67 | Wt 152.4 lb

## 2019-07-07 DIAGNOSIS — K7030 Alcoholic cirrhosis of liver without ascites: Secondary | ICD-10-CM

## 2019-07-07 DIAGNOSIS — K7460 Unspecified cirrhosis of liver: Secondary | ICD-10-CM

## 2019-07-07 DIAGNOSIS — Z23 Encounter for immunization: Secondary | ICD-10-CM | POA: Insufficient documentation

## 2019-07-07 DIAGNOSIS — B182 Chronic viral hepatitis C: Secondary | ICD-10-CM | POA: Diagnosis not present

## 2019-07-07 NOTE — Assessment & Plan Note (Signed)
Will check his EOT lab today and he will return in 6 months for SVR 24

## 2019-07-07 NOTE — Addendum Note (Signed)
Addended by: Carlean Purl on: 07/07/2019 12:35 PM   Modules accepted: Orders

## 2019-07-07 NOTE — Assessment & Plan Note (Addendum)
Due for [facility] screening and ultrasound ordered today He refused pneumovax

## 2019-07-07 NOTE — Assessment & Plan Note (Signed)
He continues to be alcohol free

## 2019-07-07 NOTE — Assessment & Plan Note (Signed)
Hepatitis B #3 given today

## 2019-07-07 NOTE — Progress Notes (Signed)
   Subjective:    Patient ID: Juan Juarez, male    DOB: October 24, 1955, 64 y.o.   MRN: [id number]  HPI Here for follow up of chronic hepatitis C.  Has genotype 1a and now has completed 24 weeks of Epclusa for chronic hepatitis C in a patient with decompensated liver cirrhosis. Followed by [facility] He has no complaints.  He continues to have pain in his elbow following treatment for osteomyelitis but no worsening symptoms or concerns.     Review of Systems  Constitutional: Negative for unexpected weight change.  Gastrointestinal: Negative for diarrhea.  Skin: Negative for rash.       Objective:   Physical Exam Constitutional:      Appearance: Normal appearance.  Eyes:     General: No scleral icterus. Neurological:     General: No focal deficit present.     Mental Status: He is alert.   SH: no alcohol for 3 years        Assessment & Plan:

## 2019-07-09 ENCOUNTER — Other Ambulatory Visit: Payer: Self-pay

## 2019-07-09 ENCOUNTER — Ambulatory Visit (HOSPITAL_COMMUNITY)
Admission: RE | Admit: 2019-07-09 | Discharge: 2019-07-09 | Disposition: A | Discharge status: Home / Self Care | Payer: Medicare Other | Source: Ambulatory Visit | Attending: Internal Medicine | Admitting: Internal Medicine

## 2019-07-09 ENCOUNTER — Other Ambulatory Visit: Payer: Medicare Other

## 2019-07-09 DIAGNOSIS — K7460 Unspecified cirrhosis of liver: Secondary | ICD-10-CM | POA: Diagnosis not present

## 2019-07-09 DIAGNOSIS — K8020 Calculus of gallbladder without cholecystitis without obstruction: Secondary | ICD-10-CM | POA: Diagnosis not present

## 2019-07-12 LAB — COMPLETE METABOLIC PANEL WITH GFR
AG Ratio: 0.9 (calc) — ABNORMAL LOW (ref 1.0–2.5)
ALT: 11 U/L (ref 9–46)
AST: 19 U/L (ref 10–35)
Albumin: 3.4 g/dL — ABNORMAL LOW (ref 3.6–5.1)
Alkaline phosphatase (APISO): 76 U/L (ref 35–144)
BUN: 16 mg/dL (ref 7–25)
CO2: 25 mmol/L (ref 20–32)
Calcium: 9.1 mg/dL (ref 8.6–10.3)
Chloride: 107 mmol/L (ref 98–110)
Creat: 0.99 mg/dL (ref 0.70–1.25)
GFR, Est African American: 93 mL/min/{1.73_m2} (ref >=60)
GFR, Est Non African American: 80 mL/min/{1.73_m2} (ref >=60)
Globulin: 3.7 g/dL (calc) (ref 1.9–3.7)
Glucose, Bld: 131 mg/dL — ABNORMAL HIGH (ref 65–99)
Potassium: 4 mmol/L (ref 3.5–5.3)
Sodium: 138 mmol/L (ref 135–146)
Total Bilirubin: 0.8 mg/dL (ref 0.2–1.2)
Total Protein: 7.1 g/dL (ref 6.1–8.1)

## 2019-07-12 LAB — HEPATITIS C RNA QUANTITATIVE
HCV Quantitative Log: <1.18 Log IU/mL
HCV RNA, PCR, QN: <15 IU/mL

## 2019-07-17 DIAGNOSIS — K7460 Unspecified cirrhosis of liver: Secondary | ICD-10-CM | POA: Diagnosis not present

## 2019-07-17 DIAGNOSIS — Z713 Dietary counseling and surveillance: Secondary | ICD-10-CM | POA: Diagnosis not present

## 2019-07-17 DIAGNOSIS — E1165 Type 2 diabetes mellitus with hyperglycemia: Secondary | ICD-10-CM | POA: Diagnosis not present

## 2019-07-17 DIAGNOSIS — Z299 Encounter for prophylactic measures, unspecified: Secondary | ICD-10-CM | POA: Diagnosis not present

## 2019-07-17 DIAGNOSIS — Z6828 Body mass index (BMI) 28.0-28.9, adult: Secondary | ICD-10-CM | POA: Diagnosis not present

## 2019-10-05 DIAGNOSIS — F411 Generalized anxiety disorder: Secondary | ICD-10-CM | POA: Diagnosis not present

## 2019-10-05 DIAGNOSIS — F331 Major depressive disorder, recurrent, moderate: Secondary | ICD-10-CM | POA: Diagnosis not present

## 2019-10-19 DIAGNOSIS — R4182 Altered mental status, unspecified: Secondary | ICD-10-CM | POA: Diagnosis not present

## 2019-10-19 DIAGNOSIS — R404 Transient alteration of awareness: Secondary | ICD-10-CM | POA: Diagnosis not present

## 2019-10-21 ENCOUNTER — Emergency Department (HOSPITAL_COMMUNITY): Payer: Medicare Other

## 2019-10-21 ENCOUNTER — Other Ambulatory Visit: Payer: Self-pay

## 2019-10-21 ENCOUNTER — Emergency Department (HOSPITAL_COMMUNITY)
Admission: EM | Admit: 2019-10-21 | Discharge: 2019-10-21 | Disposition: A | Discharge status: Home / Self Care | Payer: Medicare Other | Attending: Emergency Medicine | Admitting: Emergency Medicine

## 2019-10-21 DIAGNOSIS — F1910 Other psychoactive substance abuse, uncomplicated: Secondary | ICD-10-CM | POA: Diagnosis not present

## 2019-10-21 DIAGNOSIS — I10 Essential (primary) hypertension: Secondary | ICD-10-CM | POA: Insufficient documentation

## 2019-10-21 DIAGNOSIS — M8660 Other chronic osteomyelitis, unspecified site: Secondary | ICD-10-CM | POA: Diagnosis not present

## 2019-10-21 DIAGNOSIS — Z794 Long term (current) use of insulin: Secondary | ICD-10-CM | POA: Insufficient documentation

## 2019-10-21 DIAGNOSIS — E119 Type 2 diabetes mellitus without complications: Secondary | ICD-10-CM | POA: Insufficient documentation

## 2019-10-21 DIAGNOSIS — Z79899 Other long term (current) drug therapy: Secondary | ICD-10-CM | POA: Diagnosis not present

## 2019-10-21 DIAGNOSIS — M7989 Other specified soft tissue disorders: Secondary | ICD-10-CM | POA: Diagnosis not present

## 2019-10-21 DIAGNOSIS — F17210 Nicotine dependence, cigarettes, uncomplicated: Secondary | ICD-10-CM | POA: Diagnosis not present

## 2019-10-21 DIAGNOSIS — R4182 Altered mental status, unspecified: Secondary | ICD-10-CM

## 2019-10-21 LAB — COMPREHENSIVE METABOLIC PANEL
ALT: 17 U/L (ref 0–44)
AST: 34 U/L (ref 15–41)
Albumin: 3.2 g/dL — ABNORMAL LOW (ref 3.5–5.0)
Alkaline Phosphatase: 88 U/L (ref 38–126)
Anion gap: 9 (ref 5–15)
BUN: 21 mg/dL (ref 8–23)
CO2: 24 mmol/L (ref 22–32)
Calcium: 8.7 mg/dL — ABNORMAL LOW (ref 8.9–10.3)
Chloride: 100 mmol/L (ref 98–111)
Creatinine, Ser: 0.91 mg/dL (ref 0.61–1.24)
GFR calc Af Amer: >60 mL/min (ref >60)
GFR calc non Af Amer: >60 mL/min (ref >60)
Glucose, Bld: 128 mg/dL — ABNORMAL HIGH (ref 70–99)
Potassium: 3.6 mmol/L (ref 3.5–5.1)
Sodium: 133 mmol/L — ABNORMAL LOW (ref 135–145)
Total Bilirubin: 1.1 mg/dL (ref 0.3–1.2)
Total Protein: 8 g/dL (ref 6.5–8.1)

## 2019-10-21 LAB — CBC WITH DIFFERENTIAL/PLATELET
Abs Immature Granulocytes: 0.04 10*3/uL (ref 0.00–0.07)
Basophils Absolute: 0.1 10*3/uL (ref 0.0–0.1)
Basophils Relative: 1 %
Eosinophils Absolute: 0.1 10*3/uL (ref 0.0–0.5)
Eosinophils Relative: 1 %
HCT: 42.8 % (ref 39.0–52.0)
Hemoglobin: 14.6 g/dL (ref 13.0–17.0)
Immature Granulocytes: 0 %
Lymphocytes Relative: 22 %
Lymphs Abs: 2.3 10*3/uL (ref 0.7–4.0)
MCH: 30.4 pg (ref 26.0–34.0)
MCHC: 34.1 g/dL (ref 30.0–36.0)
MCV: 89.2 fL (ref 80.0–100.0)
Monocytes Absolute: 1.1 10*3/uL — ABNORMAL HIGH (ref 0.1–1.0)
Monocytes Relative: 10 %
Neutro Abs: 7 10*3/uL (ref 1.7–7.7)
Neutrophils Relative %: 66 %
Platelets: 143 10*3/uL — ABNORMAL LOW (ref 150–400)
RBC: 4.8 MIL/uL (ref 4.22–5.81)
RDW: 14.5 % (ref 11.5–15.5)
WBC: 10.6 10*3/uL — ABNORMAL HIGH (ref 4.0–10.5)
nRBC: 0 % (ref 0.0–0.2)

## 2019-10-21 LAB — SEDIMENTATION RATE: Sed Rate: 60 mm/hr — ABNORMAL HIGH (ref 0–16)

## 2019-10-21 LAB — LIPASE, BLOOD: Lipase: 48 U/L (ref 11–51)

## 2019-10-21 LAB — ETHANOL: Alcohol, Ethyl (B): <10 mg/dL (ref <10)

## 2019-10-21 LAB — AMMONIA: Ammonia: 27 umol/L (ref 9–35)

## 2019-10-21 LAB — LACTIC ACID, PLASMA: Lactic Acid, Venous: 1.7 mmol/L (ref 0.5–1.9)

## 2019-10-21 MED ORDER — SODIUM CHLORIDE 0.9 % IV SOLN: INTRAVENOUS | Status: DC

## 2019-10-21 NOTE — ED Triage Notes (Signed)
[place] reports a red pickup truck dropped pt off at [employer] at 6pm last night. Resident of motel states that pt has been howling and yelling all night. Pt presents with garbled speech. Pt reports falling and hitting head. Small abrasion to right forehead scalp line.

## 2019-10-21 NOTE — Discharge Instructions (Addendum)
Follow back up with infectious disease regarding the chronic osteomyelitis of the left elbow.  You were brought in for confusion and altered mental status which probably related to substance abuse.  Work-up otherwise without any acute findings.  Return for any new or worse symptoms.

## 2019-10-21 NOTE — ED Notes (Signed)
Unable to get lactic acid, notified phlebotomist to obtain lactic and second culture set.

## 2019-10-21 NOTE — ED Notes (Signed)
Spoke with sister who is going to try to find pt a ride. Sister is at work.

## 2019-10-21 NOTE — ED Notes (Signed)
Pt admits to using Cocaine and Xanax last night.

## 2019-10-21 NOTE — ED Notes (Signed)
Pt left prior to e-signing.

## 2019-10-21 NOTE — ED Provider Notes (Signed)
[facility] EMERGENCY DEPARTMENT Provider Note   CSN: [id number] Arrival date & time: 10/21/19  8786     History Chief Complaint  Patient presents with  . Altered Mental Status    Juan Juarez is a 64 y.o. male.  Patient brought in by EMS.  According to their report patient was dropped off a motel at 6 PM last night.  Resident of the motel states that patient has been howling yelling all night.  Patient presents with garbled speech somewhat drowsy.  Patient reports falling and hitting his head.  There seems to be small abrasion to his right forehead scalp area.  Patient very drowsy.  But does complain of left elbow pain.  Chart review shows that he is known to have osteomyelitis chronically of that left elbow and has not been compliant with following up.  Also has a history of substance abuse.  Diabetes and hypertension hepatitis C.        Past Medical History:  Diagnosis Date  . Anxiety   . Chronic elbow pain, left   . Depression   . Diabetes mellitus, type II ([place])    type II  . Gout   . Head injury 12/2017  . Hepatitis C without hepatic coma   . Hypertension    denies  . Inflammatory arthropathy    left elbow  . Insomnia   . Non compliance w medication regimen   . Osteoarthritis   . Polysubstance abuse ([place])   . Protein-calorie malnutrition, severe ([place]) 09/13/2016  . Seizure ([place]) 12/2017   after head Injury  . Thrombocytopenia [facility])     Patient Active Problem List   Diagnosis Date Noted  . Need for prophylactic vaccination and inoculation against viral hepatitis 07/07/2019  . Pain 04/06/2019  . Septic arthritis ([place]) 01/01/2019  . 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[place]) 11/05/2018  . Hyperosmolar (nonketotic) coma ([place]) 09/02/2018  . Type 2 diabetes mellitus with hyperglycemia, with long-term current use of insulin ([place]) 09/02/2018  . Status epilepticus ([place]) 01/22/2018  . Macrocytic anemia 01/22/2018  . Seizure ([place]) 01/22/2018  . Subdural hematoma ([place])  12/31/2017  . Chronic hepatitis C without hepatic coma ([place]) 12/31/2017  . Alcoholic cirrhosis of liver without ascites ([place]) 12/31/2017  . Hyperammonemia ([place]) 11/09/2017  . Diabetic hyperosmolar non-ketotic state ([place]) 11/07/2017  . Osteoarthritis 11/07/2017  . Tobacco abuse 11/07/2017  . Hepatic cirrhosis ([place]) 10/23/2017  . Splenomegaly   . Hypoalbuminemia   . Cocaine abuse ([place]) 05/01/2017  . Polysubstance abuse ([place]) 09/13/2016  . Elevated LFTs 09/13/2016  . Protein-calorie malnutrition, severe ([place]) 09/13/2016  . Depression 09/13/2016  . Thrombocytopenia ([place]) 09/13/2016  . Diabetes mellitus with hyperglycemia ([place]) 05/13/2015  . Essential hypertension, benign 05/13/2015  . Smoker 05/13/2015  . Back pain at L4-L5 level 11/05/2013  . Neck pain 11/05/2013  . Cervical spondylosis 11/05/2013  . Spinal stenosis of lumbar region 11/05/2013    Past Surgical History:  Procedure Laterality Date  . BURR HOLE Right 01/22/2018   Procedure: RIGHT BURR HOLES;  Surgeon: Newman Pies, MD;  Location: [place];  Service: Neurosurgery;  Laterality: Right;  . I & D EXTREMITY Left 11/06/2018   Procedure: IRRIGATION AND DEBRIDEMENT LEFT ELBOW JOINT;  Surgeon: Charlotte Crumb, MD;  Location: [place];  Service: Orthopedics;  Laterality: Left;  . INCISION AND DRAINAGE Left 10/24/2018   Procedure: INCISION AND DRAINAGE LEFT ELBOW WITH MASS EXCISION;  Surgeon: Charlotte Crumb, MD;  Location: [place];  Service: Orthopedics;  Laterality: Left;  .  MULTIPLE TOOTH EXTRACTIONS    . None         Family History  Problem Relation Age of Onset  . Thyroid disease Sister   . Colon cancer Neg Hx   . Colon polyps Neg Hx     Social History   Tobacco Use  . Smoking status: Current Every Day Smoker    Packs/day: 0.50    Years: 50.00    Pack years: 25.00    Types: Cigarettes  . Smokeless tobacco: Never Used  Substance Use Topics  . Alcohol use: Not Currently    Comment: quit 2019  . Drug use: Not  Currently    Types: Marijuana, Cocaine    Comment: cocaine last use 10/26/2018, Marijuana sping 2019    Home Medications Prior to Admission medications   Medication Sig Start Date End Date Taking? Authorizing Provider  ALPRAZolam Duanne Moron) 0.5 MG tablet Take 0.5 mg by mouth at bedtime as needed. 06/10/19  Yes [provider]  doxepin (SINEQUAN) 75 MG capsule Take 75-150 mg by mouth at bedtime as needed. 10/08/19  Yes [provider]  glipiZIDE (GLUCOTROL XL) 5 MG 24 hr tablet Take 5 mg by mouth daily. 06/10/19  Yes [provider]  LEVEMIR 100 UNIT/ML injection Inject 0.4 mLs (40 Units total) into the skin daily. 09/03/18  Yes Johnson, Clanford L, MD  metFORMIN (GLUCOPHAGE) 500 MG tablet Take 500 mg by mouth 2 (two) times daily. 10/02/18  Yes [provider]  sertraline (ZOLOFT) 100 MG tablet Take 100 mg by mouth daily. 10/08/19  Yes [provider]  simvastatin (ZOCOR) 10 MG tablet Take 10 mg by mouth daily. 06/10/19  Yes [provider]  blood glucose meter kit and supplies Dispense based on patient and insurance preference. Use up to four times daily as directed. (FOR ICD-10 E10.9, E11.9). 09/03/18   Johnson, Clanford L, MD  gabapentin (NEURONTIN) 300 MG capsule Take 300 mg by mouth 3 (three) times daily. 05/22/19   [provider]  Sofosbuvir-Velpatasvir (EPCLUSA) 400-100 MG TABS Take 1 tablet by mouth daily. Patient not taking: Reported on 07/07/2019 01/01/19   Thayer Headings, MD    Allergies    Patient has no known allergies.  Review of Systems   Review of Systems  Unable to perform ROS: Mental status change    Physical Exam Updated Vital Signs BP (!) 171/91 (BP Location: Right Arm)   Pulse 90   Temp 98.2 F (36.8 C) (Oral)   Resp 13   SpO2 97%   Physical Exam Vitals and nursing note reviewed.  Constitutional:      General: He is not in acute distress.    Appearance: Normal appearance. He is well-developed.  HENT:     Head:  Normocephalic and atraumatic.  Eyes:     Extraocular Movements: Extraocular movements intact.     Conjunctiva/sclera: Conjunctivae normal.     Pupils: Pupils are equal, round, and reactive to light.  Cardiovascular:     Rate and Rhythm: Normal rate and regular rhythm.     Heart sounds: No murmur.  Pulmonary:     Effort: Pulmonary effort is normal. No respiratory distress.     Breath sounds: Normal breath sounds.  Abdominal:     Palpations: Abdomen is soft.     Tenderness: There is no abdominal tenderness.  Musculoskeletal:        General: Tenderness and deformity present.     Cervical back: Neck supple.     Comments: Tenderness  to the left elbow area.  This seems to be deformity.  Skin:    General: Skin is warm and dry.  Neurological:     Mental Status: He is alert.     Comments: Patient very somnolent but will wake up only complains of left elbow pain.  Does move all extremities spontaneously.  But not will follow commands very well.     ED Results / Procedures / Treatments   Labs (all labs ordered are listed, but only abnormal results are displayed) Labs Reviewed  COMPREHENSIVE METABOLIC PANEL - Abnormal; Notable for the following components:      Result Value   Sodium 133 (*)    Glucose, Bld 128 (*)    Calcium 8.7 (*)    Albumin 3.2 (*)    All other components within normal limits  CBC WITH DIFFERENTIAL/PLATELET - Abnormal; Notable for the following components:   WBC 10.6 (*)    Platelets 143 (*)    Monocytes Absolute 1.1 (*)    All other components within normal limits  SEDIMENTATION RATE - Abnormal; Notable for the following components:   Sed Rate 60 (*)    All other components within normal limits  CULTURE, BLOOD (ROUTINE X 2)  CULTURE, BLOOD (ROUTINE X 2)  ETHANOL  LIPASE, BLOOD  AMMONIA  LACTIC ACID, PLASMA    EKG EKG Interpretation  Date/Time:  Wednesday October 21 2019 07:05:31 EDT Ventricular Rate:  85 PR Interval:    QRS Duration: 97 QT  Interval:  360 QTC Calculation: 428 R Axis:   -30 Text Interpretation: Sinus rhythm Left axis deviation Abnormal R-wave progression, early transition No significant change since last tracing Confirmed by Fredia Sorrow [id number]) on 10/21/2019 7:12:44 AM   Radiology No results found.   Results for orders placed or performed during the hospital encounter of 10/21/19  Culture, blood (Routine X 2) w Reflex to ID Panel   Specimen: BLOOD RIGHT HAND  Result Value Ref Range   Specimen Description BLOOD RIGHT HAND    Special Requests      BOTTLES DRAWN AEROBIC AND ANAEROBIC Blood Culture adequate volume   Culture      NO GROWTH 5 DAYS Performed at [facility], [address], [place], [place] [address]    Report Status 10/26/2019 FINAL   Culture, blood (Routine X 2) w Reflex to ID Panel   Specimen: BLOOD RIGHT ARM  Result Value Ref Range   Specimen Description BLOOD RIGHT ARM    Special Requests      BOTTLES DRAWN AEROBIC AND ANAEROBIC Blood Culture adequate volume   Culture      NO GROWTH 5 DAYS Performed at [facility], [address] [place], [place] [address]    Report Status 10/26/2019 FINAL   Comprehensive metabolic panel  Result Value Ref Range   Sodium 133 (L) 135 - 145 mmol/L   Potassium 3.6 3.5 - 5.1 mmol/L   Chloride 100 98 - 111 mmol/L   CO2 24 22 - 32 mmol/L   Glucose, Bld 128 (H) 70 - 99 mg/dL   BUN 21 8 - 23 mg/dL   Creatinine, Ser 0.91 0.61 - 1.24 mg/dL   Calcium 8.7 (L) 8.9 - 10.3 mg/dL   Total Protein 8.0 6.5 - 8.1 g/dL   Albumin 3.2 (L) 3.5 - 5.0 g/dL   AST 34 15 - 41 U/L   ALT 17 0 - 44 U/L   Alkaline Phosphatase 88 38 - 126 U/L   Total Bilirubin 1.1 0.3 -  1.2 mg/dL   GFR calc non Af Amer >60 >60 mL/min   GFR calc Af Amer >60 >60 mL/min   Anion gap 9 5 - 15  CBC with Differential/Platelet  Result Value Ref Range   WBC 10.6 (H) 4.0 - 10.5 K/uL   RBC 4.80 4.22 - 5.81 MIL/uL   Hemoglobin 14.6 13.0 - 17.0 g/dL   HCT 42.8 39.0 - 52.0 %   MCV 89.2 80.0 -  100.0 fL   MCH 30.4 26.0 - 34.0 pg   MCHC 34.1 30.0 - 36.0 g/dL   RDW 14.5 11.5 - 15.5 %   Platelets 143 (L) 150 - 400 K/uL   nRBC 0.0 0.0 - 0.2 %   Neutrophils Relative % 66 %   Neutro Abs 7.0 1.7 - 7.7 K/uL   Lymphocytes Relative 22 %   Lymphs Abs 2.3 0.7 - 4.0 K/uL   Monocytes Relative 10 %   Monocytes Absolute 1.1 (H) 0.1 - 1.0 K/uL   Eosinophils Relative 1 %   Eosinophils Absolute 0.1 0.0 - 0.5 K/uL   Basophils Relative 1 %   Basophils Absolute 0.1 0.0 - 0.1 K/uL   Immature Granulocytes 0 %   Abs Immature Granulocytes 0.04 0.00 - 0.07 K/uL  Ethanol  Result Value Ref Range   Alcohol, Ethyl (B) <10 <10 mg/dL  Lipase, blood  Result Value Ref Range   Lipase 48 11 - 51 U/L  Ammonia  Result Value Ref Range   Ammonia 27 9 - 35 umol/L  Lactic acid, plasma  Result Value Ref Range   Lactic Acid, Venous 1.7 0.5 - 1.9 mmol/L  Sedimentation rate  Result Value Ref Range   Sed Rate 60 (H) 0 - 16 mm/hr   DG Elbow Complete Left  Result Date: 10/21/2019 CLINICAL DATA:  Swelling and redness of Lt elbow. Pt confused and unable to provide Hx. EXAM: LEFT ELBOW - COMPLETE 3+ VIEW COMPARISON:  11/05/2018 FINDINGS: There is significant resorption of the distal humerus, proximal radius and proximal ulna. Specifically, there is subchondral resorption of the capitellum and along the margins of the trochlea the distal humerus. There is significant resorption of ulna along the articular margin of the olecranon, and there is complete resorption of the radial head and neck. When compared to the prior radiographs, the resorption of the distal humerus and ulna has progressed. The radius is similar. No acute fracture.  No bone lesion. Positioning is suboptimal, but there is evidence of a joint effusion with adjacent soft tissue edema. IMPRESSION: 1. Significant resorptive changes of the elbow joint that have progressed compared to the prior exam. Findings are concerning for septic arthritis. Electronically  Signed   By: Lajean Manes M.D.   On: 10/21/2019 09:03   CT Head Wo Contrast  Result Date: 10/31/2019 CLINICAL DATA:  Altered mental status. EXAM: CT HEAD WITHOUT CONTRAST TECHNIQUE: Contiguous axial images were obtained from the base of the skull through the vertex without intravenous contrast. COMPARISON:  10/21/2019 FINDINGS: Brain: No evidence of acute infarction, hemorrhage, hydrocephalus, extra-axial collection or mass lesion/mass effect. Again noted is atrophy with dilatation of the right lateral ventricle. Vascular: No hyperdense vessel or unexpected calcification. Skull: Normal. Negative for fracture or focal lesion. Sinuses/Orbits: No acute finding. Other: None. IMPRESSION: No acute intracranial abnormality. Stable atrophy with dilatation of the right lateral ventricle. Electronically Signed   By: Constance Holster M.D.   On: 10/31/2019 22:33   CT Head Wo Contrast  Result Date: 10/21/2019 CLINICAL DATA:  Altered mental status.  Prior subdural hematoma. EXAM: CT HEAD WITHOUT CONTRAST TECHNIQUE: Contiguous axial images were obtained from the base of the skull through the vertex without intravenous contrast. COMPARISON:  CT 01/28/2018 FINDINGS: Imaging is significantly degraded by patient head motion. Patient was uncooperative and a difficult to image without head motion. Repeat imaging performed with no improvement. Brain: No acute extra-axial fluid collections. Cortical atrophy noted with mild dilatation of the ventricles. Unchanged from prior no intraventricular hemorrhage. Cortical lesion or infarction identified. Basilar cisterns are patent. Vascular: No hyperdense vessel or unexpected calcification. Skull: Burr holes in the RIGHT calvarium related to prior subdural hematoma. Sinuses/Orbits: No acute finding. Other: None IMPRESSION: 1. Severe motion degradation of the imaging. No acute intra cranial abnormality identified. 2. Atrophy with ventricle dilatation. 3. Burr holes related to prior RIGHT  subdural hematoma evacuation. Electronically Signed   By: Suzy Bouchard M.D.   On: 10/21/2019 09:30   DG Chest Port 1 View  Result Date: 10/21/2019 CLINICAL DATA:  Altered mental status.  Abnormal speech. EXAM: PORTABLE CHEST 1 VIEW COMPARISON:  One-view chest x-ray 01/30/2018. FINDINGS: Heart size is normal. Lung volumes are low. Mild interstitial prominence is present without focal airspace disease or consolidation. No edema or effusions are present. IMPRESSION: 1. Low lung volumes. 2. No acute cardiopulmonary disease. Electronically Signed   By: San Morelle M.D.   On: 10/21/2019 07:54     Procedures Procedures (including critical care time)  Medications Ordered in ED Medications - No data to display  ED Course  I have reviewed the triage vital signs and the nursing notes.  Pertinent labs & imaging results that were available during my care of the patient were reviewed by me and considered in my medical decision making (see chart for details).    MDM Rules/Calculators/A&P                      Chart review shows that he is got chronic osteomyelitis at left elbow.  X-rays cannot confirm this.  Also with some bone loss.  Chart review also shows that he has been noncompliant with follow-up of this.  Patient with a history of substance abuse.  Patient observed here.  CT of head chest x-ray without any acute findings.  Work-up here without any significant findings.  Patient observed.  Patient eventually became functional.  And wanted to go.  Clinically stable.  Patient stable for discharge home.     Final Clinical Impression(s) / ED Diagnoses Final diagnoses:  Altered mental status, unspecified altered mental status type  Substance abuse ([place])  Chronic osteomyelitis [facility])    Rx / DC Orders ED Discharge Orders    None       Fredia Sorrow, MD 11/05/19 1850

## 2019-10-21 NOTE — Progress Notes (Signed)
CSW has reviewed chart and notes that patient admits to recent drug use. CSW will contact patient to interview regarding his substance use and offer resources to assist him if receptive.   [place] [facility]  Clinical Social Worker  Ph: [phone]

## 2019-10-26 LAB — CULTURE, BLOOD (ROUTINE X 2)
Culture: NO GROWTH
Culture: NO GROWTH
Special Requests: ADEQUATE
Special Requests: ADEQUATE

## 2019-10-31 ENCOUNTER — Emergency Department (HOSPITAL_COMMUNITY)
Admission: EM | Admit: 2019-10-31 | Discharge: 2019-11-01 | Disposition: A | Discharge status: Home / Self Care | Payer: Medicare Other | Attending: Emergency Medicine | Admitting: Emergency Medicine

## 2019-10-31 ENCOUNTER — Encounter (HOSPITAL_COMMUNITY): Payer: Self-pay | Admitting: Emergency Medicine

## 2019-10-31 ENCOUNTER — Other Ambulatory Visit: Payer: Self-pay

## 2019-10-31 ENCOUNTER — Emergency Department (HOSPITAL_COMMUNITY): Payer: Medicare Other

## 2019-10-31 DIAGNOSIS — E119 Type 2 diabetes mellitus without complications: Secondary | ICD-10-CM | POA: Insufficient documentation

## 2019-10-31 DIAGNOSIS — R4182 Altered mental status, unspecified: Secondary | ICD-10-CM | POA: Diagnosis not present

## 2019-10-31 DIAGNOSIS — T50904A Poisoning by unspecified drugs, medicaments and biological substances, undetermined, initial encounter: Secondary | ICD-10-CM | POA: Diagnosis not present

## 2019-10-31 DIAGNOSIS — R404 Transient alteration of awareness: Secondary | ICD-10-CM | POA: Diagnosis not present

## 2019-10-31 DIAGNOSIS — Z79899 Other long term (current) drug therapy: Secondary | ICD-10-CM | POA: Insufficient documentation

## 2019-10-31 DIAGNOSIS — I10 Essential (primary) hypertension: Secondary | ICD-10-CM | POA: Diagnosis not present

## 2019-10-31 DIAGNOSIS — R0902 Hypoxemia: Secondary | ICD-10-CM | POA: Diagnosis not present

## 2019-10-31 DIAGNOSIS — F1910 Other psychoactive substance abuse, uncomplicated: Secondary | ICD-10-CM | POA: Diagnosis not present

## 2019-10-31 DIAGNOSIS — F1410 Cocaine abuse, uncomplicated: Secondary | ICD-10-CM | POA: Insufficient documentation

## 2019-10-31 DIAGNOSIS — F17210 Nicotine dependence, cigarettes, uncomplicated: Secondary | ICD-10-CM | POA: Diagnosis not present

## 2019-10-31 DIAGNOSIS — Z794 Long term (current) use of insulin: Secondary | ICD-10-CM | POA: Diagnosis not present

## 2019-10-31 DIAGNOSIS — Z7984 Long term (current) use of oral hypoglycemic drugs: Secondary | ICD-10-CM | POA: Insufficient documentation

## 2019-10-31 DIAGNOSIS — T887XXA Unspecified adverse effect of drug or medicament, initial encounter: Secondary | ICD-10-CM | POA: Diagnosis not present

## 2019-10-31 NOTE — ED Notes (Signed)
Patient refusing blood work at this time.

## 2019-10-31 NOTE — ED Notes (Addendum)
Patient asleep currently

## 2019-10-31 NOTE — ED Triage Notes (Signed)
Pt used crack today.  Pt denies any complaints.  Ems called by a bystander

## 2019-10-31 NOTE — ED Notes (Signed)
Pt in bed with eyes closed, pt answering questions with eyes closed, pt knows that he is in [facility], knows that he arrived via ambulance, doesn't know the day of the week, reoriented pt, pt offers no complaints, states that he didn't call the ambulance and wasn't planning on coming to the ER today

## 2019-10-31 NOTE — ED Provider Notes (Signed)
[facility] EMERGENCY DEPARTMENT Provider Note   CSN: [id number] Arrival date & time: 10/31/19  1716     History Chief Complaint  Patient presents with  . Drug Overdose    Juan Juarez is a 64 y.o. male with history of insulin dependent DM, substance abuse, cirrhosis who presents with drug use. Pt is acutely intoxicated - he states he used cocaine. EMS was apparently called by a bystander. Pt is unable to provide a good history and has garbled speech. He states that his L elbow has been "septic" for two years but otherwise has not other specific complaints. Per chart review he was seen in the ED on 4/21 for a similar problem. Attempted to call family with no success.  LEVEL 5 caveat due to drug use/AMS  HPI     Past Medical History:  Diagnosis Date  . Anxiety   . Chronic elbow pain, left   . Depression   . Diabetes mellitus, type II ([place])    type II  . Gout   . Head injury 12/2017  . Hepatitis C without hepatic coma   . Hypertension    denies  . Inflammatory arthropathy    left elbow  . Insomnia   . Non compliance w medication regimen   . Osteoarthritis   . Polysubstance abuse ([place])   . Protein-calorie malnutrition, severe ([place]) 09/13/2016  . Seizure ([place]) 12/2017   after head Injury  . Thrombocytopenia [facility])     Patient Active Problem List   Diagnosis Date Noted  . Need for prophylactic vaccination and inoculation against viral hepatitis 07/07/2019  . Pain 04/06/2019  . Septic arthritis ([place]) 01/01/2019  . 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[place]) 11/05/2018  . Hyperosmolar (nonketotic) coma ([place]) 09/02/2018  . Type 2 diabetes mellitus with hyperglycemia, with long-term current use of insulin ([place]) 09/02/2018  . Status epilepticus ([place]) 01/22/2018  . Macrocytic anemia 01/22/2018  . Seizure ([place]) 01/22/2018  . Subdural hematoma ([place]) 12/31/2017  . Chronic hepatitis C without hepatic coma ([place]) 12/31/2017  . Alcoholic cirrhosis of liver without ascites ([place])  12/31/2017  . Hyperammonemia ([place]) 11/09/2017  . Diabetic hyperosmolar non-ketotic state ([place]) 11/07/2017  . Osteoarthritis 11/07/2017  . Tobacco abuse 11/07/2017  . Hepatic cirrhosis ([place]) 10/23/2017  . Splenomegaly   . Hypoalbuminemia   . Cocaine abuse ([place]) 05/01/2017  . Polysubstance abuse ([place]) 09/13/2016  . Elevated LFTs 09/13/2016  . Protein-calorie malnutrition, severe ([place]) 09/13/2016  . Depression 09/13/2016  . Thrombocytopenia ([place]) 09/13/2016  . Diabetes mellitus with hyperglycemia ([place]) 05/13/2015  . Essential hypertension, benign 05/13/2015  . Smoker 05/13/2015  . Back pain at L4-L5 level 11/05/2013  . Neck pain 11/05/2013  . Cervical spondylosis 11/05/2013  . Spinal stenosis of lumbar region 11/05/2013    Past Surgical History:  Procedure Laterality Date  . BURR HOLE Right 01/22/2018   Procedure: RIGHT BURR HOLES;  Surgeon: Newman Pies, MD;  Location: [place];  Service: Neurosurgery;  Laterality: Right;  . I & D EXTREMITY Left 11/06/2018   Procedure: IRRIGATION AND DEBRIDEMENT LEFT ELBOW JOINT;  Surgeon: Charlotte Crumb, MD;  Location: [place];  Service: Orthopedics;  Laterality: Left;  . INCISION AND DRAINAGE Left 10/24/2018   Procedure: INCISION AND DRAINAGE LEFT ELBOW WITH MASS EXCISION;  Surgeon: Charlotte Crumb, MD;  Location: [place];  Service: Orthopedics;  Laterality: Left;  Marland Kitchen MULTIPLE TOOTH EXTRACTIONS    . None         Family History  Problem Relation Age of Onset  .  Thyroid disease Sister   . Colon cancer Neg Hx   . Colon polyps Neg Hx     Social History   Tobacco Use  . Smoking status: Current Every Day Smoker    Packs/day: 0.50    Years: 50.00    Pack years: 25.00    Types: Cigarettes  . Smokeless tobacco: Never Used  Substance Use Topics  . Alcohol use: Not Currently    Comment: quit 2019  . Drug use: Not Currently    Types: Marijuana, Cocaine    Comment: cocaine last use 10/26/2018, Marijuana sping 2019    Home  Medications Prior to Admission medications   Medication Sig Start Date End Date Taking? Authorizing Provider  ALPRAZolam Duanne Moron) 0.5 MG tablet Take 0.5 mg by mouth at bedtime as needed. 06/10/19   [provider]  blood glucose meter kit and supplies Dispense based on patient and insurance preference. Use up to four times daily as directed. (FOR ICD-10 E10.9, E11.9). Patient not taking: Reported on 10/21/2019 09/03/18   Murlean Iba, MD  doxepin (SINEQUAN) 75 MG capsule Take 75-150 mg by mouth at bedtime as needed. 10/08/19   [provider]  gabapentin (NEURONTIN) 300 MG capsule Take 300 mg by mouth 3 (three) times daily. 05/22/19   [provider]  glipiZIDE (GLUCOTROL XL) 5 MG 24 hr tablet Take 5 mg by mouth daily. 06/10/19   [provider]  glipiZIDE (GLUCOTROL) 5 MG tablet Take 5 mg by mouth daily.  12/10/17   [provider]  LEVEMIR 100 UNIT/ML injection Inject 0.4 mLs (40 Units total) into the skin daily. 09/03/18   Johnson, Clanford L, MD  metFORMIN (GLUCOPHAGE) 500 MG tablet Take 500 mg by mouth 2 (two) times daily. 10/02/18   [provider]  sertraline (ZOLOFT) 100 MG tablet Take 100 mg by mouth daily. 10/08/19   [provider]  simvastatin (ZOCOR) 10 MG tablet Take 10 mg by mouth daily. 06/10/19   [provider]  Sofosbuvir-Velpatasvir (EPCLUSA) 400-100 MG TABS Take 1 tablet by mouth daily. Patient not taking: Reported on 07/07/2019 01/01/19   Thayer Headings, MD    Allergies    Patient has no known allergies.  Review of Systems   Review of Systems  Unable to perform ROS: Mental status change  Musculoskeletal: Positive for arthralgias.    Physical Exam Updated Vital Signs BP 122/82 (BP Location: Right Arm)   Pulse 87   Temp 98.4 F (36.9 C) (Oral)   Resp 17   Ht 5' 7" (1.702 m)   Wt 69 kg   SpO2 98%   BMI 23.82 kg/m   Physical Exam Vitals and nursing note reviewed.  Constitutional:      General: He  is not in acute distress.    Appearance: Normal appearance. He is well-developed.     Comments: Chronically ill appearing male in NAD. Easily aroused but difficult to understand. He is rocking back and forth in the bed and has garbled speech  HENT:     Head: Normocephalic and atraumatic.  Eyes:     General: No scleral icterus.       Right eye: No discharge.        Left eye: No discharge.     Conjunctiva/sclera: Conjunctivae normal.     Pupils: Pupils are equal, round, and reactive to light.  Cardiovascular:     Rate and Rhythm: Normal rate and regular rhythm.  Pulmonary:     Effort: Pulmonary effort  is normal. No respiratory distress.     Breath sounds: Normal breath sounds.  Abdominal:     General: There is no distension.     Palpations: Abdomen is soft.     Tenderness: There is no abdominal tenderness.  Musculoskeletal:     Cervical back: Normal range of motion.     Comments: Left elbow has multiple well healed surgical scars. No redness or swelling noted although pt is guarding his L arm  Skin:    General: Skin is warm and dry.  Neurological:     Mental Status: He is alert and oriented to person, place, and time.  Psychiatric:        Attention and Perception: He is inattentive.        Mood and Affect: Affect is labile.        Speech: Speech is slurred and tangential.        Behavior: Behavior is hyperactive (restless). Behavior is cooperative.     ED Results / Procedures / Treatments   Labs (all labs ordered are listed, but only abnormal results are displayed) Labs Reviewed  CBC WITH DIFFERENTIAL/PLATELET  RAPID URINE DRUG SCREEN, HOSP PERFORMED  COMPREHENSIVE METABOLIC PANEL  URINALYSIS, ROUTINE W REFLEX MICROSCOPIC  ETHANOL    EKG None  Radiology CT Head Wo Contrast  Result Date: 10/31/2019 CLINICAL DATA:  Altered mental status. EXAM: CT HEAD WITHOUT CONTRAST TECHNIQUE: Contiguous axial images were obtained from the base of the skull through the vertex without  intravenous contrast. COMPARISON:  10/21/2019 FINDINGS: Brain: No evidence of acute infarction, hemorrhage, hydrocephalus, extra-axial collection or mass lesion/mass effect. Again noted is atrophy with dilatation of the right lateral ventricle. Vascular: No hyperdense vessel or unexpected calcification. Skull: Normal. Negative for fracture or focal lesion. Sinuses/Orbits: No acute finding. Other: None. IMPRESSION: No acute intracranial abnormality. Stable atrophy with dilatation of the right lateral ventricle. Electronically Signed   By: Constance Holster M.D.   On: 10/31/2019 22:33    Procedures Procedures (including critical care time)  Medications Ordered in ED Medications - No data to display  ED Course  I have reviewed the triage vital signs and the nursing notes.  Pertinent labs & imaging results that were available during my care of the patient were reviewed by me and considered in my medical decision making (see chart for details).  64 year old male presents after EMS picked him up due to a bystander calling EMS. Pt is altered and cannot provide a history. He is restless and irritable when he is woken up but otherwise has just been asleep. Will continue to monitor. Attempted to call family without success.  7PM Pt re-evaluated - he is easily awakened however still talking non-sensically. Will order labs, head CT.  9PM Pt is refusing labs. He was advised he needs to wake up otherwise we will do more invasive testing. He agrees to wake up and states he wants to go home. Head CT obtained is negative.   10PM He was able to ambulate with nursing however is not back to baseline and just falls back asleep in the stretcher. Will re-attempt obtaining labs.  At shift change labs are pending. Pt is still soundly sleeping. I do not feel he is appropriate for discharge just yet until he is back to baseline or family can pick him up. Care signed out to Dr. Roxanne Mins.  MDM Rules/Calculators/A&P  Final Clinical Impression(s) / ED Diagnoses Final diagnoses:  Substance abuse [facility])    Rx / DC Orders ED Discharge Orders    None       Recardo Evangelist, PA-C 11/01/19 0119    Margette Fast, MD 11/03/19 1334

## 2019-10-31 NOTE — ED Notes (Signed)
Patient ambulated to the bathroom with assistance.

## 2019-11-01 LAB — CBC WITH DIFFERENTIAL/PLATELET
Abs Immature Granulocytes: 0.03 10*3/uL (ref 0.00–0.07)
Basophils Absolute: 0 10*3/uL (ref 0.0–0.1)
Basophils Relative: 1 %
Eosinophils Absolute: 0.1 10*3/uL (ref 0.0–0.5)
Eosinophils Relative: 1 %
HCT: 37.7 % — ABNORMAL LOW (ref 39.0–52.0)
Hemoglobin: 13 g/dL (ref 13.0–17.0)
Immature Granulocytes: 0 %
Lymphocytes Relative: 29 %
Lymphs Abs: 2.5 10*3/uL (ref 0.7–4.0)
MCH: 31.2 pg (ref 26.0–34.0)
MCHC: 34.5 g/dL (ref 30.0–36.0)
MCV: 90.4 fL (ref 80.0–100.0)
Monocytes Absolute: 1.1 10*3/uL — ABNORMAL HIGH (ref 0.1–1.0)
Monocytes Relative: 13 %
Neutro Abs: 4.9 10*3/uL (ref 1.7–7.7)
Neutrophils Relative %: 56 %
Platelets: 135 10*3/uL — ABNORMAL LOW (ref 150–400)
RBC: 4.17 MIL/uL — ABNORMAL LOW (ref 4.22–5.81)
RDW: 15.3 % (ref 11.5–15.5)
WBC: 8.7 10*3/uL (ref 4.0–10.5)
nRBC: 0 % (ref 0.0–0.2)

## 2019-11-01 LAB — AMMONIA: Ammonia: 22 umol/L (ref 9–35)

## 2019-11-01 LAB — COMPREHENSIVE METABOLIC PANEL
ALT: 12 U/L (ref 0–44)
AST: 18 U/L (ref 15–41)
Albumin: 2.8 g/dL — ABNORMAL LOW (ref 3.5–5.0)
Alkaline Phosphatase: 71 U/L (ref 38–126)
Anion gap: 7 (ref 5–15)
BUN: 22 mg/dL (ref 8–23)
CO2: 24 mmol/L (ref 22–32)
Calcium: 8.5 mg/dL — ABNORMAL LOW (ref 8.9–10.3)
Chloride: 103 mmol/L (ref 98–111)
Creatinine, Ser: 0.83 mg/dL (ref 0.61–1.24)
GFR calc Af Amer: >60 mL/min (ref >60)
GFR calc non Af Amer: >60 mL/min (ref >60)
Glucose, Bld: 133 mg/dL — ABNORMAL HIGH (ref 70–99)
Potassium: 3.9 mmol/L (ref 3.5–5.1)
Sodium: 134 mmol/L — ABNORMAL LOW (ref 135–145)
Total Bilirubin: 1.3 mg/dL — ABNORMAL HIGH (ref 0.3–1.2)
Total Protein: 7.3 g/dL (ref 6.5–8.1)

## 2019-11-01 LAB — URINALYSIS, ROUTINE W REFLEX MICROSCOPIC
Bacteria, UA: NONE SEEN
Bilirubin Urine: NEGATIVE
Glucose, UA: NEGATIVE mg/dL
Ketones, ur: 5 mg/dL — AB
Leukocytes,Ua: NEGATIVE
Nitrite: NEGATIVE
Protein, ur: >=300 mg/dL — AB
Specific Gravity, Urine: 1.013 (ref 1.005–1.030)
pH: 6 (ref 5.0–8.0)

## 2019-11-01 LAB — ETHANOL: Alcohol, Ethyl (B): <10 mg/dL (ref <10)

## 2019-11-01 LAB — RAPID URINE DRUG SCREEN, HOSP PERFORMED
Amphetamines: NOT DETECTED
Barbiturates: NOT DETECTED
Benzodiazepines: POSITIVE — AB
Cocaine: POSITIVE — AB
Opiates: NOT DETECTED
Tetrahydrocannabinol: NOT DETECTED

## 2019-11-01 NOTE — ED Notes (Signed)
Patient  Sleeping at this time.

## 2019-11-01 NOTE — ED Provider Notes (Signed)
Care assumed from [facility], [place].  Patient slept through the night.  He was reevaluated and was noted to be arousable and stated that he was hungry.  We will attempt to ambulate him to make sure he is steady on his feet.   Delora Fuel, MD [phone]

## 2019-11-01 NOTE — Discharge Instructions (Signed)
Do not use drugs except those that are prescribed for you>

## 2019-11-01 NOTE — ED Notes (Signed)
Tried to ambulate patient. Patient says that he still feels weak and went back to sleep.

## 2019-11-23 DIAGNOSIS — Z23 Encounter for immunization: Secondary | ICD-10-CM | POA: Diagnosis not present

## 2019-12-01 DIAGNOSIS — F331 Major depressive disorder, recurrent, moderate: Secondary | ICD-10-CM | POA: Diagnosis not present

## 2019-12-01 DIAGNOSIS — F411 Generalized anxiety disorder: Secondary | ICD-10-CM | POA: Diagnosis not present

## 2019-12-21 ENCOUNTER — Other Ambulatory Visit: Payer: Self-pay

## 2019-12-21 ENCOUNTER — Ambulatory Visit (INDEPENDENT_AMBULATORY_CARE_PROVIDER_SITE_OTHER): Payer: Medicare Other | Admitting: Internal Medicine

## 2019-12-21 ENCOUNTER — Encounter: Payer: Self-pay | Admitting: Internal Medicine

## 2019-12-21 VITALS — BP 145/90 | HR 76 | Temp 98.0°F | Wt 191.0 lb

## 2019-12-21 DIAGNOSIS — K7460 Unspecified cirrhosis of liver: Secondary | ICD-10-CM | POA: Diagnosis not present

## 2019-12-21 DIAGNOSIS — M869 Osteomyelitis, unspecified: Secondary | ICD-10-CM | POA: Diagnosis not present

## 2019-12-21 DIAGNOSIS — F1910 Other psychoactive substance abuse, uncomplicated: Secondary | ICD-10-CM | POA: Diagnosis not present

## 2019-12-21 DIAGNOSIS — B182 Chronic viral hepatitis C: Secondary | ICD-10-CM

## 2019-12-21 NOTE — Assessment & Plan Note (Signed)
Will check HCV RNA today Will be considered cured if negative.

## 2019-12-21 NOTE — Assessment & Plan Note (Signed)
Will continue with [place] screening now and in 6 months.  rtc 1 year

## 2019-12-21 NOTE — Progress Notes (Signed)
   Subjective:    Patient ID: Juan Juarez, male    DOB: Jun 11, 1956, 64 y.o.   MRN: [id number]  HPI Here for follow up of chronic hepatitis C.  Has genotype 1a and now has completed 24 weeks of Epclusa for chronic hepatitis C in a patient with decompensated liver cirrhosis. He finished treatment 6 months ago and here for SVR 24.  Still with pain in his elbow.  He has not been back to his orthopedist.     Review of Systems  Constitutional: Negative for unexpected weight change.  Gastrointestinal: Negative for diarrhea.  Skin: Negative for rash.       Objective:   Physical Exam Constitutional:      Appearance: Normal appearance.  Eyes:     General: No scleral icterus. Neurological:     General: No focal deficit present.     Mental Status: He is alert.   SH: no alcohol for 3 years        Assessment & Plan:

## 2019-12-21 NOTE — Assessment & Plan Note (Signed)
He will go back to Dr. Burney Gauze to see if further surgery/elbow replacement indicated for him now that his infection is cleared.

## 2019-12-21 NOTE — Assessment & Plan Note (Signed)
Recent cocaine use but denies any alcohol

## 2019-12-22 DIAGNOSIS — Z23 Encounter for immunization: Secondary | ICD-10-CM | POA: Diagnosis not present

## 2019-12-23 ENCOUNTER — Telehealth: Payer: Self-pay

## 2019-12-23 LAB — COMPLETE METABOLIC PANEL WITH GFR
AG Ratio: 0.7 (calc) — ABNORMAL LOW (ref 1.0–2.5)
ALT: 13 U/L (ref 9–46)
AST: 18 U/L (ref 10–35)
Albumin: 3.1 g/dL — ABNORMAL LOW (ref 3.6–5.1)
Alkaline phosphatase (APISO): 95 U/L (ref 35–144)
BUN: 23 mg/dL (ref 7–25)
CO2: 21 mmol/L (ref 20–32)
Calcium: 9 mg/dL (ref 8.6–10.3)
Chloride: 105 mmol/L (ref 98–110)
Creat: 0.89 mg/dL (ref 0.70–1.25)
GFR, Est African American: 104 mL/min/{1.73_m2} (ref >=60)
GFR, Est Non African American: 90 mL/min/{1.73_m2} (ref >=60)
Globulin: 4.3 g/dL (calc) — ABNORMAL HIGH (ref 1.9–3.7)
Glucose, Bld: 181 mg/dL — ABNORMAL HIGH (ref 65–99)
Potassium: 4.6 mmol/L (ref 3.5–5.3)
Sodium: 134 mmol/L — ABNORMAL LOW (ref 135–146)
Total Bilirubin: 0.4 mg/dL (ref 0.2–1.2)
Total Protein: 7.4 g/dL (ref 6.1–8.1)

## 2019-12-23 LAB — HEPATITIS C RNA QUANTITATIVE
HCV Quantitative Log: <1.18 Log IU/mL
HCV RNA, PCR, QN: <15 IU/mL

## 2019-12-23 NOTE — Telephone Encounter (Signed)
Attempted to relay test results to patient. VM not set up. MyChart is not activated. Will try again later.  Meenakshi Sazama Lorita Officer, RN

## 2019-12-23 NOTE — Telephone Encounter (Signed)
Test results relayed to patient. No questions or concerns at this time.   Caileb Rhue Lorita Officer, RN

## 2019-12-23 NOTE — Telephone Encounter (Signed)
-----   Message from Thayer Headings, MD sent at 12/23/2019  1:22 PM EDT ----- Please let him know that his hepatitis C test is negative confirming cure of hepatitis C.   thanks

## 2019-12-27 ENCOUNTER — Other Ambulatory Visit: Payer: Self-pay

## 2019-12-27 ENCOUNTER — Encounter (HOSPITAL_COMMUNITY): Payer: Self-pay | Admitting: Emergency Medicine

## 2019-12-27 ENCOUNTER — Emergency Department (HOSPITAL_COMMUNITY): Payer: Medicare Other

## 2019-12-27 ENCOUNTER — Emergency Department (HOSPITAL_COMMUNITY)
Admission: EM | Admit: 2019-12-27 | Discharge: 2019-12-28 | Disposition: A | Discharge status: Home / Self Care | Payer: Medicare Other | Attending: Emergency Medicine | Admitting: Emergency Medicine

## 2019-12-27 DIAGNOSIS — Z794 Long term (current) use of insulin: Secondary | ICD-10-CM | POA: Insufficient documentation

## 2019-12-27 DIAGNOSIS — G9389 Other specified disorders of brain: Secondary | ICD-10-CM | POA: Diagnosis not present

## 2019-12-27 DIAGNOSIS — R569 Unspecified convulsions: Secondary | ICD-10-CM | POA: Diagnosis not present

## 2019-12-27 DIAGNOSIS — Z79899 Other long term (current) drug therapy: Secondary | ICD-10-CM | POA: Insufficient documentation

## 2019-12-27 DIAGNOSIS — R410 Disorientation, unspecified: Secondary | ICD-10-CM | POA: Diagnosis not present

## 2019-12-27 DIAGNOSIS — R404 Transient alteration of awareness: Secondary | ICD-10-CM | POA: Diagnosis not present

## 2019-12-27 DIAGNOSIS — Z9889 Other specified postprocedural states: Secondary | ICD-10-CM | POA: Diagnosis not present

## 2019-12-27 DIAGNOSIS — I10 Essential (primary) hypertension: Secondary | ICD-10-CM | POA: Insufficient documentation

## 2019-12-27 DIAGNOSIS — E119 Type 2 diabetes mellitus without complications: Secondary | ICD-10-CM | POA: Diagnosis not present

## 2019-12-27 DIAGNOSIS — G40909 Epilepsy, unspecified, not intractable, without status epilepticus: Secondary | ICD-10-CM | POA: Diagnosis not present

## 2019-12-27 DIAGNOSIS — F1210 Cannabis abuse, uncomplicated: Secondary | ICD-10-CM | POA: Insufficient documentation

## 2019-12-27 DIAGNOSIS — F17210 Nicotine dependence, cigarettes, uncomplicated: Secondary | ICD-10-CM | POA: Diagnosis not present

## 2019-12-27 LAB — CBC WITH DIFFERENTIAL/PLATELET
Abs Immature Granulocytes: 0.05 10*3/uL (ref 0.00–0.07)
Basophils Absolute: 0 10*3/uL (ref 0.0–0.1)
Basophils Relative: 0 %
Eosinophils Absolute: 0.1 10*3/uL (ref 0.0–0.5)
Eosinophils Relative: 1 %
HCT: 39.9 % (ref 39.0–52.0)
Hemoglobin: 13.6 g/dL (ref 13.0–17.0)
Immature Granulocytes: 1 %
Lymphocytes Relative: 16 %
Lymphs Abs: 1.4 10*3/uL (ref 0.7–4.0)
MCH: 30.8 pg (ref 26.0–34.0)
MCHC: 34.1 g/dL (ref 30.0–36.0)
MCV: 90.3 fL (ref 80.0–100.0)
Monocytes Absolute: 0.7 10*3/uL (ref 0.1–1.0)
Monocytes Relative: 8 %
Neutro Abs: 6.5 10*3/uL (ref 1.7–7.7)
Neutrophils Relative %: 74 %
Platelets: 125 10*3/uL — ABNORMAL LOW (ref 150–400)
RBC: 4.42 MIL/uL (ref 4.22–5.81)
RDW: 14.3 % (ref 11.5–15.5)
WBC: 8.7 10*3/uL (ref 4.0–10.5)
nRBC: 0 % (ref 0.0–0.2)

## 2019-12-27 LAB — COMPREHENSIVE METABOLIC PANEL
ALT: 21 U/L (ref 0–44)
AST: 18 U/L (ref 15–41)
Albumin: 2.8 g/dL — ABNORMAL LOW (ref 3.5–5.0)
Alkaline Phosphatase: 103 U/L (ref 38–126)
Anion gap: 8 (ref 5–15)
BUN: 26 mg/dL — ABNORMAL HIGH (ref 8–23)
CO2: 22 mmol/L (ref 22–32)
Calcium: 8.8 mg/dL — ABNORMAL LOW (ref 8.9–10.3)
Chloride: 103 mmol/L (ref 98–111)
Creatinine, Ser: 1 mg/dL (ref 0.61–1.24)
GFR calc Af Amer: >60 mL/min (ref >60)
GFR calc non Af Amer: >60 mL/min (ref >60)
Glucose, Bld: 206 mg/dL — ABNORMAL HIGH (ref 70–99)
Potassium: 4.4 mmol/L (ref 3.5–5.1)
Sodium: 133 mmol/L — ABNORMAL LOW (ref 135–145)
Total Bilirubin: 0.3 mg/dL (ref 0.3–1.2)
Total Protein: 7.7 g/dL (ref 6.5–8.1)

## 2019-12-27 LAB — MAGNESIUM: Magnesium: 1.7 mg/dL (ref 1.7–2.4)

## 2019-12-27 LAB — CBG MONITORING, ED: Glucose-Capillary: 181 mg/dL — ABNORMAL HIGH (ref 70–99)

## 2019-12-27 LAB — ETHANOL: Alcohol, Ethyl (B): <10 mg/dL (ref <10)

## 2019-12-27 NOTE — ED Notes (Signed)
ekg handed to Dr. Zackowski 

## 2019-12-27 NOTE — ED Provider Notes (Signed)
[facility] EMERGENCY DEPARTMENT Provider Note   CSN: [id number] Arrival date & time: 12/27/19  2018     History Chief Complaint  Patient presents with  . Seizures    Juan Juarez is a 64 y.o. male.  Patient brought in for reported seizure.  Family witnessed the seizure lasted for 45 seconds.  EMS reported patient was postictal upon their arrival.  But no family members are here.  Patient known to me.  Patient has a longstanding history of substance abuse.  Hepatitis C.  Diabetes.  And chronic nonhealing left elbow possibly osteomyelitis.  Patient has a past history of seizures but not on antiseizure meds.  Patient also with history of alcohol abuse.  Upon arrival here patient is fine patient without any complaints.  Denies wetting himself or biting his tongue.        Past Medical History:  Diagnosis Date  . Anxiety   . Chronic elbow pain, left   . Depression   . Diabetes mellitus, type II ([place])    type II  . Gout   . Head injury 12/2017  . Hepatitis C without hepatic coma   . Hypertension    denies  . Inflammatory arthropathy    left elbow  . Insomnia   . Non compliance w medication regimen   . Osteoarthritis   . Polysubstance abuse ([place])   . Protein-calorie malnutrition, severe ([place]) 09/13/2016  . Seizure ([place]) 12/2017   after head Injury  . Thrombocytopenia [facility])     Patient Active Problem List   Diagnosis Date Noted  . Pain 04/06/2019  . Septic arthritis ([place]) 01/01/2019  . 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[place]) 11/05/2018  . Hyperosmolar (nonketotic) coma ([place]) 09/02/2018  . Type 2 diabetes mellitus with hyperglycemia, with long-term current use of insulin ([place]) 09/02/2018  . Status epilepticus ([place]) 01/22/2018  . Macrocytic anemia 01/22/2018  . Seizure ([place]) 01/22/2018  . Subdural hematoma ([place]) 12/31/2017  . Chronic hepatitis C without hepatic coma ([place]) 12/31/2017  . Alcoholic cirrhosis of liver without ascites ([place]) 12/31/2017  . Hyperammonemia ([place])  11/09/2017  . Diabetic hyperosmolar non-ketotic state ([place]) 11/07/2017  . Osteoarthritis 11/07/2017  . Tobacco abuse 11/07/2017  . Hepatic cirrhosis ([place]) 10/23/2017  . Splenomegaly   . Hypoalbuminemia   . Cocaine abuse ([place]) 05/01/2017  . Polysubstance abuse ([place]) 09/13/2016  . Elevated LFTs 09/13/2016  . Protein-calorie malnutrition, severe ([place]) 09/13/2016  . Depression 09/13/2016  . Thrombocytopenia ([place]) 09/13/2016  . Diabetes mellitus with hyperglycemia ([place]) 05/13/2015  . Essential hypertension, benign 05/13/2015  . Smoker 05/13/2015  . Back pain at L4-L5 level 11/05/2013  . Neck pain 11/05/2013  . Cervical spondylosis 11/05/2013  . Spinal stenosis of lumbar region 11/05/2013    Past Surgical History:  Procedure Laterality Date  . BURR HOLE Right 01/22/2018   Procedure: RIGHT BURR HOLES;  Surgeon: Newman Pies, MD;  Location: [place];  Service: Neurosurgery;  Laterality: Right;  . I & D EXTREMITY Left 11/06/2018   Procedure: IRRIGATION AND DEBRIDEMENT LEFT ELBOW JOINT;  Surgeon: Charlotte Crumb, MD;  Location: [place];  Service: Orthopedics;  Laterality: Left;  . INCISION AND DRAINAGE Left 10/24/2018   Procedure: INCISION AND DRAINAGE LEFT ELBOW WITH MASS EXCISION;  Surgeon: Charlotte Crumb, MD;  Location: [place];  Service: Orthopedics;  Laterality: Left;  Marland Kitchen MULTIPLE TOOTH EXTRACTIONS    . None         Family History  Problem Relation Age of Onset  . Thyroid disease Sister   .  Colon cancer Neg Hx   . Colon polyps Neg Hx     Social History   Tobacco Use  . Smoking status: Current Every Day Smoker    Packs/day: 0.50    Years: 50.00    Pack years: 25.00    Types: Cigarettes  . Smokeless tobacco: Never Used  Vaping Use  . Vaping Use: Never used  Substance Use Topics  . Alcohol use: Not Currently    Comment: quit 2019  . Drug use: Not Currently    Types: Marijuana, Cocaine    Comment: cocaine last use 10/26/2018, Marijuana sping 2019    Home  Medications Prior to Admission medications   Medication Sig Start Date End Date Taking? Authorizing Provider  ALPRAZolam Duanne Moron) 0.5 MG tablet Take 0.5 mg by mouth at bedtime as needed. 06/10/19   [provider]  blood glucose meter kit and supplies Dispense based on patient and insurance preference. Use up to four times daily as directed. (FOR ICD-10 E10.9, E11.9). 09/03/18   Johnson, Clanford L, MD  doxepin (SINEQUAN) 75 MG capsule Take 75-150 mg by mouth at bedtime as needed. 10/08/19   [provider]  gabapentin (NEURONTIN) 300 MG capsule Take 300 mg by mouth 3 (three) times daily. 05/22/19   [provider]  glipiZIDE (GLUCOTROL XL) 5 MG 24 hr tablet Take 5 mg by mouth daily. 06/10/19   [provider]  LEVEMIR 100 UNIT/ML injection Inject 0.4 mLs (40 Units total) into the skin daily. 09/03/18   Johnson, Clanford L, MD  metFORMIN (GLUCOPHAGE) 500 MG tablet Take 500 mg by mouth 2 (two) times daily. 10/02/18   [provider]  sertraline (ZOLOFT) 100 MG tablet Take 100 mg by mouth daily. 10/08/19   [provider]  simvastatin (ZOCOR) 10 MG tablet Take 10 mg by mouth daily. 06/10/19   [provider]  Sofosbuvir-Velpatasvir (EPCLUSA) 400-100 MG TABS Take 1 tablet by mouth daily. 01/01/19   Comer, Okey Regal, MD    Allergies    Patient has no known allergies.  Review of Systems   Review of Systems  Constitutional: Negative for chills and fever.  HENT: Negative for congestion, rhinorrhea and sore throat.   Eyes: Negative for visual disturbance.  Respiratory: Negative for cough and shortness of breath.   Cardiovascular: Negative for chest pain and leg swelling.  Gastrointestinal: Negative for abdominal pain, diarrhea, nausea and vomiting.  Genitourinary: Negative for dysuria.  Musculoskeletal: Negative for back pain and neck pain.  Skin: Negative for rash.  Neurological: Positive for seizures. Negative for dizziness, light-headedness and  headaches.  Hematological: Does not bruise/bleed easily.  Psychiatric/Behavioral: Negative for confusion.    Physical Exam Updated Vital Signs BP 122/73   Pulse 81   Temp 98 F (36.7 C) (Oral)   Resp (!) 21   Ht 1.702 m (5' 7")   Wt 87 kg   SpO2 95%   BMI 30.04 kg/m   Physical Exam Vitals and nursing note reviewed.  Constitutional:      Appearance: Normal appearance. He is well-developed.  HENT:     Head: Normocephalic and atraumatic.  Eyes:     Extraocular Movements: Extraocular movements intact.     Conjunctiva/sclera: Conjunctivae normal.     Pupils: Pupils are equal, round, and reactive to light.  Cardiovascular:     Rate and Rhythm: Normal rate and regular rhythm.     Heart sounds: No murmur heard.   Pulmonary:     Effort: Pulmonary effort is  normal. No respiratory distress.     Breath sounds: Normal breath sounds.  Abdominal:     Palpations: Abdomen is soft.     Tenderness: There is no abdominal tenderness.  Musculoskeletal:        General: Tenderness and deformity present. Normal range of motion.     Cervical back: Normal range of motion and neck supple.     Comments: Patient has some deformity and little bit some tenderness at the left elbow area.  Unchanged from when I saw him before.  Skin:    General: Skin is warm and dry.     Capillary Refill: Capillary refill takes less than 2 seconds.  Neurological:     General: No focal deficit present.     Mental Status: He is alert and oriented to person, place, and time.     Cranial Nerves: No cranial nerve deficit.     Sensory: No sensory deficit.     Motor: No weakness.     ED Results / Procedures / Treatments   Labs (all labs ordered are listed, but only abnormal results are displayed) Labs Reviewed  COMPREHENSIVE METABOLIC PANEL - Abnormal; Notable for the following components:      Result Value   Sodium 133 (*)    Glucose, Bld 206 (*)    BUN 26 (*)    Calcium 8.8 (*)    Albumin 2.8 (*)    All  other components within normal limits  CBC WITH DIFFERENTIAL/PLATELET - Abnormal; Notable for the following components:   Platelets 125 (*)    All other components within normal limits  CBG MONITORING, ED - Abnormal; Notable for the following components:   Glucose-Capillary 181 (*)    All other components within normal limits  MAGNESIUM  ETHANOL  RAPID URINE DRUG SCREEN, HOSP PERFORMED  URINALYSIS, ROUTINE W REFLEX MICROSCOPIC    EKG EKG Interpretation  Date/Time:  Sunday December 27 2019 20:58:05 EDT Ventricular Rate:  82 PR Interval:    QRS Duration: 89 QT Interval:  374 QTC Calculation: 437 R Axis:   -3 Text Interpretation: Sinus rhythm Abnormal R-wave progression, early transition Baseline wander in lead(s) V5 No significant change since last tracing Confirmed by Fredia Sorrow [phone]) on 12/27/2019 9:26:32 PM   Radiology CT Head Wo Contrast  Result Date: 12/27/2019 CLINICAL DATA:  Seizure, postictal EXAM: CT HEAD WITHOUT CONTRAST TECHNIQUE: Contiguous axial images were obtained from the base of the skull through the vertex without intravenous contrast. COMPARISON:  10/31/2019 FINDINGS: Brain: No acute infarct or hemorrhage. Lateral ventricles are stable, with ex vacuo dilatation of the right lateral ventricle. Remaining midline structures are unremarkable. No acute extra-axial fluid collections. No mass effect. Vascular: No hyperdense vessel or unexpected calcification. Skull: Postsurgical changes from previous burr holes on the right. No acute bony abnormalities. Sinuses/Orbits: No acute finding. Other: None. IMPRESSION: 1. Stable exam, no acute process. Electronically Signed   By: Randa Ngo M.D.   On: 12/27/2019 22:05   DG Chest Port 1 View  Result Date: 12/27/2019 CLINICAL DATA:  Seizure. EXAM: PORTABLE CHEST 1 VIEW COMPARISON:  None. FINDINGS: The heart size and mediastinal contours are within normal limits. Both lungs are clear. The visualized skeletal structures are  unremarkable. IMPRESSION: No active disease. Electronically Signed   By: Constance Holster M.D.   On: 12/27/2019 21:39    Procedures Procedures (including critical care time)  Medications Ordered in ED Medications - No data to display  ED Course  I have reviewed the  triage vital signs and the nursing notes.  Pertinent labs & imaging results that were available during my care of the patient were reviewed by me and considered in my medical decision making (see chart for details).    MDM Rules/Calculators/A&P                         Patient has been clinically stable the whole time that we have seen him.  Labs without significant abnormality.  Did have some hyperglycemia.  Known to be diabetic but not acidotic.  No leukocytosis.  No other significant electrolyte abnormalities.  Head CT was negative EKG without any significant arrhythmias.  Had ordered a urine drug screen with patient not per pending urinalysis.  Alcohol level is not elevated.  Chest x-ray negative as well.  Patient clinically stable can be discharged for outpatient follow-up with neurology.  Patient's left elbow is known to have chronic malformation or nonhealing and may have osteomyelitis.  Patient does not follow-up for that.  No significant changes examining the elbow today.  Final Clinical Impression(s) / ED Diagnoses Final diagnoses:  Seizure [facility])    Rx / DC Orders ED Discharge Orders    None       Fredia Sorrow, MD 12/28/19 [phone]

## 2019-12-27 NOTE — ED Triage Notes (Signed)
Patient brought in  by EMS for seizures. Family witnessed patient having a seizure for 45 seconds. EMS reports patient was post-ictal upon their arrival.

## 2019-12-28 NOTE — Discharge Instructions (Addendum)
Work-up here tonight without any acute findings.  Make an appointment to follow-up with neurology.  There can 1 to do an EEG to further evaluate the seizure that occurred tonight.  Return for any new or worse symptoms.

## 2020-01-06 ENCOUNTER — Ambulatory Visit (HOSPITAL_COMMUNITY): Payer: Medicare Other

## 2020-03-03 ENCOUNTER — Emergency Department (HOSPITAL_COMMUNITY): Payer: Medicare Other

## 2020-03-03 ENCOUNTER — Emergency Department (HOSPITAL_COMMUNITY)
Admission: EM | Admit: 2020-03-03 | Discharge: 2020-03-03 | Discharge status: Against Medical Advice | Payer: Medicare Other | Attending: Emergency Medicine | Admitting: Emergency Medicine

## 2020-03-03 ENCOUNTER — Encounter (HOSPITAL_COMMUNITY): Payer: Self-pay | Admitting: Emergency Medicine

## 2020-03-03 ENCOUNTER — Other Ambulatory Visit: Payer: Self-pay

## 2020-03-03 DIAGNOSIS — E119 Type 2 diabetes mellitus without complications: Secondary | ICD-10-CM | POA: Diagnosis not present

## 2020-03-03 DIAGNOSIS — K7460 Unspecified cirrhosis of liver: Secondary | ICD-10-CM

## 2020-03-03 DIAGNOSIS — R112 Nausea with vomiting, unspecified: Secondary | ICD-10-CM | POA: Diagnosis not present

## 2020-03-03 DIAGNOSIS — R1010 Upper abdominal pain, unspecified: Secondary | ICD-10-CM | POA: Diagnosis not present

## 2020-03-03 DIAGNOSIS — R1084 Generalized abdominal pain: Secondary | ICD-10-CM | POA: Diagnosis not present

## 2020-03-03 DIAGNOSIS — R000 Tachycardia, unspecified: Secondary | ICD-10-CM | POA: Diagnosis not present

## 2020-03-03 DIAGNOSIS — K760 Fatty (change of) liver, not elsewhere classified: Secondary | ICD-10-CM | POA: Diagnosis not present

## 2020-03-03 DIAGNOSIS — Z794 Long term (current) use of insulin: Secondary | ICD-10-CM | POA: Insufficient documentation

## 2020-03-03 DIAGNOSIS — F17210 Nicotine dependence, cigarettes, uncomplicated: Secondary | ICD-10-CM | POA: Diagnosis not present

## 2020-03-03 DIAGNOSIS — K824 Cholesterolosis of gallbladder: Secondary | ICD-10-CM | POA: Diagnosis not present

## 2020-03-03 DIAGNOSIS — I10 Essential (primary) hypertension: Secondary | ICD-10-CM | POA: Insufficient documentation

## 2020-03-03 DIAGNOSIS — Z20822 Contact with and (suspected) exposure to covid-19: Secondary | ICD-10-CM | POA: Diagnosis not present

## 2020-03-03 DIAGNOSIS — R109 Unspecified abdominal pain: Secondary | ICD-10-CM | POA: Diagnosis not present

## 2020-03-03 DIAGNOSIS — B182 Chronic viral hepatitis C: Secondary | ICD-10-CM

## 2020-03-03 DIAGNOSIS — R52 Pain, unspecified: Secondary | ICD-10-CM | POA: Diagnosis not present

## 2020-03-03 LAB — COMPREHENSIVE METABOLIC PANEL
ALT: 40 U/L (ref 0–44)
AST: 84 U/L — ABNORMAL HIGH (ref 15–41)
Albumin: 3 g/dL — ABNORMAL LOW (ref 3.5–5.0)
Alkaline Phosphatase: 122 U/L (ref 38–126)
Anion gap: 13 (ref 5–15)
BUN: 23 mg/dL (ref 8–23)
CO2: 17 mmol/L — ABNORMAL LOW (ref 22–32)
Calcium: 8.6 mg/dL — ABNORMAL LOW (ref 8.9–10.3)
Chloride: 105 mmol/L (ref 98–111)
Creatinine, Ser: 1.07 mg/dL (ref 0.61–1.24)
GFR calc Af Amer: >60 mL/min (ref >60)
GFR calc non Af Amer: >60 mL/min (ref >60)
Glucose, Bld: 149 mg/dL — ABNORMAL HIGH (ref 70–99)
Potassium: 3.6 mmol/L (ref 3.5–5.1)
Sodium: 135 mmol/L (ref 135–145)
Total Bilirubin: 3.1 mg/dL — ABNORMAL HIGH (ref 0.3–1.2)
Total Protein: 7.8 g/dL (ref 6.5–8.1)

## 2020-03-03 LAB — LIPASE, BLOOD: Lipase: 40 U/L (ref 11–51)

## 2020-03-03 LAB — CBG MONITORING, ED: Glucose-Capillary: 155 mg/dL — ABNORMAL HIGH (ref 70–99)

## 2020-03-03 LAB — SARS CORONAVIRUS 2 BY RT PCR (HOSPITAL ORDER, PERFORMED IN ~~LOC~~ HOSPITAL LAB): SARS Coronavirus 2: NEGATIVE

## 2020-03-03 LAB — ETHANOL: Alcohol, Ethyl (B): <10 mg/dL (ref <10)

## 2020-03-03 MED ORDER — ONDANSETRON HCL 4 MG/2ML IJ SOLN
4.0000 mg | Freq: Once | INTRAMUSCULAR | Status: AC
  Administered 2020-03-03: 4 mg via INTRAVENOUS
  Filled 2020-03-03: qty 2

## 2020-03-03 NOTE — ED Provider Notes (Signed)
[facility] EMERGENCY DEPARTMENT Provider Note   CSN: [id number] Arrival date & time: 03/03/20  1314     History Chief Complaint  Patient presents with  . Abdominal Pain    Logon Uttech is a 64 y.o. male with a history significant for diabetes, polysubstance abuse including cocaine, patient states he last drank EtOH 2 years ago, also with history of treated hepatitis C but with residual liver cirrhosis presenting with upper abdominal pain, fevers, chills, nausea and vomiting which started yesterday.  He denies hematemesis, also no diarrhea. He denies abdominal bloating or distention, no dysuria, flank pain, hematuria.  He also denies CP or sob.  No abdominal surgical history.   The history is provided by the patient.       Past Medical History:  Diagnosis Date  . Anxiety   . Chronic elbow pain, left   . Depression   . Diabetes mellitus, type II ([place])    type II  . Gout   . Head injury 12/2017  . Hepatitis C without hepatic coma   . Hypertension    denies  . Inflammatory arthropathy    left elbow  . Insomnia   . Non compliance w medication regimen   . Osteoarthritis   . Polysubstance abuse ([place])   . Protein-calorie malnutrition, severe ([place]) 09/13/2016  . Seizure ([place]) 12/2017   after head Injury  . Thrombocytopenia [facility])     Patient Active Problem List   Diagnosis Date Noted  . Pain 04/06/2019  . Septic arthritis ([place]) 01/01/2019  . 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 Hyperosmolar (nonketotic) coma ([place]) 09/02/2018  . Type 2 diabetes mellitus with hyperglycemia, with long-term current use of insulin ([place]) 09/02/2018  . Status epilepticus ([place]) 01/22/2018  . Macrocytic anemia 01/22/2018  . Seizure ([place]) 01/22/2018  . Subdural hematoma ([place]) 12/31/2017  . Chronic hepatitis C without hepatic coma ([place]) 12/31/2017  . Alcoholic cirrhosis of liver without ascites ([place]) 12/31/2017  . Hyperammonemia ([place]) 11/09/2017  . Diabetic hyperosmolar non-ketotic  state ([place]) 11/07/2017  . Osteoarthritis 11/07/2017  . Tobacco abuse 11/07/2017  . Hepatic cirrhosis ([place]) 10/23/2017  . Splenomegaly   . Hypoalbuminemia   . Cocaine abuse ([place]) 05/01/2017  . Polysubstance abuse ([place]) 09/13/2016  . Elevated LFTs 09/13/2016  . Protein-calorie malnutrition, severe ([place]) 09/13/2016  . Depression 09/13/2016  . Thrombocytopenia ([place]) 09/13/2016  . Diabetes mellitus with hyperglycemia ([place]) 05/13/2015  . Essential hypertension, benign 05/13/2015  . Smoker 05/13/2015  . Back pain at L4-L5 level 11/05/2013  . Neck pain 11/05/2013  . Cervical spondylosis 11/05/2013  . Spinal stenosis of lumbar region 11/05/2013    Past Surgical History:  Procedure Laterality Date  . BURR HOLE Right 01/22/2018   Procedure: RIGHT BURR HOLES;  Surgeon: Newman Pies, MD;  Location: [place];  Service: Neurosurgery;  Laterality: Right;  . I & D EXTREMITY Left 11/06/2018   Procedure: IRRIGATION AND DEBRIDEMENT LEFT ELBOW JOINT;  Surgeon: Charlotte Crumb, MD;  Location: [place];  Service: Orthopedics;  Laterality: Left;  . INCISION AND DRAINAGE Left 10/24/2018   Procedure: INCISION AND DRAINAGE LEFT ELBOW WITH MASS EXCISION;  Surgeon: Charlotte Crumb, MD;  Location: [place];  Service: Orthopedics;  Laterality: Left;  Marland Kitchen MULTIPLE TOOTH EXTRACTIONS    . None         Family History  Problem Relation Age of Onset  . Thyroid disease Sister   . Colon cancer Neg Hx   . Colon polyps Neg Hx  Social History   Tobacco Use  . Smoking status: Current Every Day Smoker    Packs/day: 0.50    Years: 50.00    Pack years: 25.00    Types: Cigarettes  . Smokeless tobacco: Never Used  Vaping Use  . Vaping Use: Never used  Substance Use Topics  . Alcohol use: Not Currently    Comment: quit 2019  . Drug use: Not Currently    Types: Marijuana, Cocaine    Comment: cocaine last use 10/26/2018, Marijuana sping 2019    Home Medications Prior to Admission medications   Medication Sig  Start Date End Date Taking? Authorizing Provider  ALPRAZolam Duanne Moron) 0.5 MG tablet Take 0.5 mg by mouth at bedtime as needed. 06/10/19   [provider]  blood glucose meter kit and supplies Dispense based on patient and insurance preference. Use up to four times daily as directed. (FOR ICD-10 E10.9, E11.9). 09/03/18   Johnson, Clanford L, MD  doxepin (SINEQUAN) 75 MG capsule Take 75-150 mg by mouth at bedtime as needed. 10/08/19   [provider]  gabapentin (NEURONTIN) 300 MG capsule Take 300 mg by mouth 3 (three) times daily. 05/22/19   [provider]  glipiZIDE (GLUCOTROL XL) 5 MG 24 hr tablet Take 5 mg by mouth daily. 06/10/19   [provider]  LEVEMIR 100 UNIT/ML injection Inject 0.4 mLs (40 Units total) into the skin daily. 09/03/18   Johnson, Clanford L, MD  metFORMIN (GLUCOPHAGE) 500 MG tablet Take 500 mg by mouth 2 (two) times daily. 10/02/18   [provider]  sertraline (ZOLOFT) 100 MG tablet Take 100 mg by mouth daily. 10/08/19   [provider]  simvastatin (ZOCOR) 10 MG tablet Take 10 mg by mouth daily. 06/10/19   [provider]  Sofosbuvir-Velpatasvir (EPCLUSA) 400-100 MG TABS Take 1 tablet by mouth daily. 01/01/19   Comer, Okey Regal, MD    Allergies    Patient has no known allergies.  Review of Systems   Review of Systems  Constitutional: Negative for fever.  HENT: Negative for congestion and sore throat.   Eyes: Negative.   Respiratory: Negative for chest tightness and shortness of breath.   Cardiovascular: Negative for chest pain.  Gastrointestinal: Positive for abdominal pain, nausea and vomiting. Negative for abdominal distention and diarrhea.  Genitourinary: Negative.   Musculoskeletal: Negative for arthralgias, joint swelling and neck pain.  Skin: Negative.  Negative for rash and wound.  Neurological: Negative for dizziness, weakness, light-headedness, numbness and headaches.  Psychiatric/Behavioral: Negative.      Physical Exam Updated Vital Signs BP 130/74 (BP Location: Right Arm)   Pulse (!) 102   Temp 98 F (36.7 C) (Oral)   Resp 20   SpO2 97%   Physical Exam Vitals and nursing note reviewed.  Constitutional:      Appearance: He is well-developed.  HENT:     Head: Normocephalic and atraumatic.  Eyes:     Conjunctiva/sclera: Conjunctivae normal.  Cardiovascular:     Rate and Rhythm: Regular rhythm. Tachycardia present.     Heart sounds: Normal heart sounds.  Pulmonary:     Effort: Pulmonary effort is normal.     Breath sounds: Normal breath sounds. No wheezing.  Abdominal:     General: Bowel sounds are normal.     Palpations: Abdomen is soft.     Tenderness: There is abdominal tenderness in the epigastric area. There is no guarding or rebound. Negative signs include Murphy's sign.  Musculoskeletal:  General: Normal range of motion.     Cervical back: Normal range of motion.  Skin:    General: Skin is warm and dry.  Neurological:     Mental Status: He is alert.     ED Results / Procedures / Treatments   Labs (all labs ordered are listed, but only abnormal results are displayed) Labs Reviewed  COMPREHENSIVE METABOLIC PANEL - Abnormal; Notable for the following components:      Result Value   CO2 17 (*)    Glucose, Bld 149 (*)    Calcium 8.6 (*)    Albumin 3.0 (*)    AST 84 (*)    Total Bilirubin 3.1 (*)    All other components within normal limits  CBG MONITORING, ED - Abnormal; Notable for the following components:   Glucose-Capillary 155 (*)    All other components within normal limits  SARS CORONAVIRUS 2 BY RT PCR (HOSPITAL ORDER, [facility])  LIPASE, BLOOD  ETHANOL  URINALYSIS, ROUTINE W REFLEX MICROSCOPIC  CBC WITH DIFFERENTIAL/PLATELET  RAPID URINE DRUG SCREEN, HOSP PERFORMED  CBC WITH DIFFERENTIAL/PLATELET    EKG None  Radiology US Abdomen Complete  Result Date: 03/03/2020 CLINICAL DATA:  Abdominal pain. EXAM:  ABDOMEN ULTRASOUND COMPLETE COMPARISON:  None. FINDINGS: Gallbladder: A 6.5 mm nonshadowing echogenic focus is seen along the wall of the nondependent portion of the gallbladder lumen. A mild amount of heterogeneous sludge is also noted within the gallbladder. No gallstones or wall thickening visualized (2.7 mm). No sonographic Murphy sign noted by sonographer. Common bile duct: Diameter: 4.6 mm Liver: No focal lesion identified. There is diffusely increased echogenicity of the liver parenchyma. Portal vein is patent on color Doppler imaging with normal direction of blood flow towards the liver. IVC: No abnormality visualized. Pancreas: The pancreas is limited in visualization secondary to overlying bowel gas. Spleen: The spleen is enlarged and measures 15.9 cm in length. Echogenicity of the splenic parenchyma is unremarkable. Right Kidney: Length: 12.7 cm. Echogenicity within normal limits. No mass or hydronephrosis visualized. Left Kidney: Length: 8.7 cm. Echogenicity within normal limits. A 4.0 cm x 3.6 cm x 3.1 cm anechoic structure is seen along the lateral aspect of the left kidney. No hydronephrosis visualized. Abdominal aorta: No aneurysm visualized. Other findings: It should be noted that the study is limited secondary to inability of the patient to comply during the exam. IMPRESSION: 1. 6.5 mm gallbladder polyp. 2. Gallbladder sludge without evidence of cholelithiasis or acute cholecystitis. 3. Fatty liver. 4. Large left renal cyst. Electronically Signed   By: Virgina Norfolk M.D.   On: 03/03/2020 16:59    Procedures Procedures (including critical care time)  Medications Ordered in ED Medications  ondansetron (ZOFRAN) injection 4 mg (4 mg Intravenous Given 03/03/20 1409)    ED Course  I have reviewed the triage vital signs and the nursing notes.  Pertinent labs & imaging results that were available during my care of the patient were reviewed by me and considered in my medical decision making  (see chart for details).    MDM Rules/Calculators/A&P                          Patient with abdominal pain, nausea and vomiting. Labs reviewed and he has an elevated bilirubin level, also his AST level is elevated. An ultrasound was performed to evaluate his liver and gallbladder. He does have a gallbladder polyp but no acute cholecystitis. Gallbladder sludge  was present however.  Patient eloped after having the ultrasound performed. I was not notified of patient's departure until after he was out of the department. Final Clinical Impression(s) / ED Diagnoses Final diagnoses:  Pain of upper abdomen    Rx / DC Orders ED Discharge Orders    None       Landis Martins 03/03/20 1834    Lucrezia Starch, MD 03/21/20 [phone]

## 2020-03-03 NOTE — ED Notes (Signed)
CBG was 177

## 2020-03-03 NOTE — ED Triage Notes (Signed)
Patient complains of upper abd pain and vomiting.  Fever and chills.

## 2020-03-03 NOTE — ED Notes (Signed)
Pt refused extra blood work

## 2020-04-05 DIAGNOSIS — F411 Generalized anxiety disorder: Secondary | ICD-10-CM | POA: Diagnosis not present

## 2020-04-05 DIAGNOSIS — F331 Major depressive disorder, recurrent, moderate: Secondary | ICD-10-CM | POA: Diagnosis not present

## 2020-06-17 DIAGNOSIS — Z1331 Encounter for screening for depression: Secondary | ICD-10-CM | POA: Diagnosis not present

## 2020-06-17 DIAGNOSIS — Z79899 Other long term (current) drug therapy: Secondary | ICD-10-CM | POA: Diagnosis not present

## 2020-06-17 DIAGNOSIS — Z125 Encounter for screening for malignant neoplasm of prostate: Secondary | ICD-10-CM | POA: Diagnosis not present

## 2020-06-17 DIAGNOSIS — R5383 Other fatigue: Secondary | ICD-10-CM | POA: Diagnosis not present

## 2020-06-17 DIAGNOSIS — Z7189 Other specified counseling: Secondary | ICD-10-CM | POA: Diagnosis not present

## 2020-06-17 DIAGNOSIS — E7800 Pure hypercholesterolemia, unspecified: Secondary | ICD-10-CM | POA: Diagnosis not present

## 2020-06-17 DIAGNOSIS — Z0000 Encounter for general adult medical examination without abnormal findings: Secondary | ICD-10-CM | POA: Diagnosis not present

## 2020-06-17 DIAGNOSIS — Z1339 Encounter for screening examination for other mental health and behavioral disorders: Secondary | ICD-10-CM | POA: Diagnosis not present

## 2020-06-17 DIAGNOSIS — Z299 Encounter for prophylactic measures, unspecified: Secondary | ICD-10-CM | POA: Diagnosis not present

## 2020-06-17 DIAGNOSIS — F17210 Nicotine dependence, cigarettes, uncomplicated: Secondary | ICD-10-CM | POA: Diagnosis not present

## 2020-06-17 DIAGNOSIS — Z6831 Body mass index (BMI) 31.0-31.9, adult: Secondary | ICD-10-CM | POA: Diagnosis not present

## 2020-06-17 DIAGNOSIS — E1165 Type 2 diabetes mellitus with hyperglycemia: Secondary | ICD-10-CM | POA: Diagnosis not present

## 2020-06-22 ENCOUNTER — Ambulatory Visit (HOSPITAL_COMMUNITY): Payer: Medicare Other

## 2020-06-27 DIAGNOSIS — Z23 Encounter for immunization: Secondary | ICD-10-CM | POA: Diagnosis not present

## 2020-06-29 ENCOUNTER — Emergency Department (HOSPITAL_COMMUNITY): Payer: Medicare Other

## 2020-06-29 ENCOUNTER — Other Ambulatory Visit: Payer: Self-pay

## 2020-06-29 ENCOUNTER — Emergency Department (HOSPITAL_COMMUNITY)
Admission: EM | Admit: 2020-06-29 | Discharge: 2020-06-29 | Disposition: A | Discharge status: Home / Self Care | Payer: Medicare Other | Attending: Emergency Medicine | Admitting: Emergency Medicine

## 2020-06-29 ENCOUNTER — Encounter (HOSPITAL_COMMUNITY): Payer: Self-pay

## 2020-06-29 DIAGNOSIS — S53102A Unspecified subluxation of left ulnohumeral joint, initial encounter: Secondary | ICD-10-CM | POA: Diagnosis not present

## 2020-06-29 DIAGNOSIS — Z79899 Other long term (current) drug therapy: Secondary | ICD-10-CM | POA: Insufficient documentation

## 2020-06-29 DIAGNOSIS — W19XXXA Unspecified fall, initial encounter: Secondary | ICD-10-CM

## 2020-06-29 DIAGNOSIS — S59902A Unspecified injury of left elbow, initial encounter: Secondary | ICD-10-CM | POA: Diagnosis present

## 2020-06-29 DIAGNOSIS — E1165 Type 2 diabetes mellitus with hyperglycemia: Secondary | ICD-10-CM | POA: Insufficient documentation

## 2020-06-29 DIAGNOSIS — G8929 Other chronic pain: Secondary | ICD-10-CM | POA: Insufficient documentation

## 2020-06-29 DIAGNOSIS — W06XXXA Fall from bed, initial encounter: Secondary | ICD-10-CM | POA: Diagnosis not present

## 2020-06-29 DIAGNOSIS — S6391XA Sprain of unspecified part of right wrist and hand, initial encounter: Secondary | ICD-10-CM

## 2020-06-29 DIAGNOSIS — I10 Essential (primary) hypertension: Secondary | ICD-10-CM | POA: Diagnosis not present

## 2020-06-29 DIAGNOSIS — F17210 Nicotine dependence, cigarettes, uncomplicated: Secondary | ICD-10-CM | POA: Insufficient documentation

## 2020-06-29 DIAGNOSIS — Z7984 Long term (current) use of oral hypoglycemic drugs: Secondary | ICD-10-CM | POA: Insufficient documentation

## 2020-06-29 DIAGNOSIS — S0990XA Unspecified injury of head, initial encounter: Secondary | ICD-10-CM | POA: Diagnosis not present

## 2020-06-29 DIAGNOSIS — Z794 Long term (current) use of insulin: Secondary | ICD-10-CM | POA: Diagnosis not present

## 2020-06-29 DIAGNOSIS — S199XXA Unspecified injury of neck, initial encounter: Secondary | ICD-10-CM | POA: Diagnosis not present

## 2020-06-29 DIAGNOSIS — R519 Headache, unspecified: Secondary | ICD-10-CM | POA: Diagnosis not present

## 2020-06-29 DIAGNOSIS — Z043 Encounter for examination and observation following other accident: Secondary | ICD-10-CM | POA: Diagnosis not present

## 2020-06-29 DIAGNOSIS — M25522 Pain in left elbow: Secondary | ICD-10-CM | POA: Diagnosis not present

## 2020-06-29 MED ORDER — OXYCODONE-ACETAMINOPHEN 5-325 MG PO TABS: 1.0000 | ORAL_TABLET | ORAL | 0 refills | Status: DC | PRN

## 2020-06-29 MED ORDER — OXYCODONE-ACETAMINOPHEN 5-325 MG PO TABS
1.0000 | ORAL_TABLET | Freq: Once | ORAL | Status: AC
  Administered 2020-06-29: 1 via ORAL
  Filled 2020-06-29: qty 1

## 2020-06-29 NOTE — ED Provider Notes (Signed)
[facility] EMERGENCY DEPARTMENT Provider Note   CSN: [id number] Arrival date & time: 06/29/20  2706     History Chief Complaint  Patient presents with  . Fall    Juan Juarez is a 64 y.o. male.  HPI      Juan Juarez is a 64 y.o. male with past medical history of type 2 diabetes, hypertension, and chronic left elbow pain who presents to the Emergency Department complaining of worsening left elbow pain, right hand pain and headache since a fall out of bed this morning. States approximately fell 2 to 3 fell out of the bed and struck his head on a nightstand. He recalls waking up when he fell and states he did not lose consciousness, but was unable to get up initially.  States that his right arm struck a trash can and he was somehow wedged between the bed and the nightstand.  He was finally able to get up without assistance.  He denies chest pain, shortness of breath, back pain, visual changes, nausea and vomiting.    Past Medical History:  Diagnosis Date  . Anxiety   . Chronic elbow pain, left   . Depression   . Diabetes mellitus, type II ([place])    type II  . Gout   . Head injury 12/2017  . Hepatitis C without hepatic coma   . Hypertension    denies  . Inflammatory arthropathy    left elbow  . Insomnia   . Non compliance w medication regimen   . Osteoarthritis   . Polysubstance abuse ([place])   . Protein-calorie malnutrition, severe ([place]) 09/13/2016  . Seizure ([place]) 12/2017   after head Injury  . Thrombocytopenia [facility])     Patient Active Problem List   Diagnosis Date Noted  . Pain 04/06/2019  . Septic arthritis ([place]) 01/01/2019  . 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[place]) 11/05/2018  . Hyperosmolar (nonketotic) coma ([place]) 09/02/2018  . Type 2 diabetes mellitus with hyperglycemia, with long-term current use of insulin ([place]) 09/02/2018  . Status epilepticus ([place]) 01/22/2018  . Macrocytic anemia 01/22/2018  . Seizure ([place]) 01/22/2018  . Subdural hematoma ([place]) 12/31/2017   . Chronic hepatitis C without hepatic coma ([place]) 12/31/2017  . Alcoholic cirrhosis of liver without ascites ([place]) 12/31/2017  . Hyperammonemia ([place]) 11/09/2017  . Diabetic hyperosmolar non-ketotic state ([place]) 11/07/2017  . Osteoarthritis 11/07/2017  . Tobacco abuse 11/07/2017  . Hepatic cirrhosis ([place]) 10/23/2017  . Splenomegaly   . Hypoalbuminemia   . Cocaine abuse ([place]) 05/01/2017  . Polysubstance abuse ([place]) 09/13/2016  . Elevated LFTs 09/13/2016  . Protein-calorie malnutrition, severe ([place]) 09/13/2016  . Depression 09/13/2016  . Thrombocytopenia ([place]) 09/13/2016  . Diabetes mellitus with hyperglycemia ([place]) 05/13/2015  . Essential hypertension, benign 05/13/2015  . Smoker 05/13/2015  . Back pain at L4-L5 level 11/05/2013  . Neck pain 11/05/2013  . Cervical spondylosis 11/05/2013  . Spinal stenosis of lumbar region 11/05/2013    Past Surgical History:  Procedure Laterality Date  . BURR HOLE Right 01/22/2018   Procedure: RIGHT BURR HOLES;  Surgeon: Newman Pies, MD;  Location: [place];  Service: Neurosurgery;  Laterality: Right;  . I & D EXTREMITY Left 11/06/2018   Procedure: IRRIGATION AND DEBRIDEMENT LEFT ELBOW JOINT;  Surgeon: Charlotte Crumb, MD;  Location: [place];  Service: Orthopedics;  Laterality: Left;  . INCISION AND DRAINAGE Left 10/24/2018   Procedure: INCISION AND DRAINAGE LEFT ELBOW WITH MASS EXCISION;  Surgeon: Charlotte Crumb, MD;  Location: [place];  Service: Orthopedics;  Laterality: Left;  Marland Kitchen MULTIPLE TOOTH EXTRACTIONS    . None         Family History  Problem Relation Age of Onset  . Thyroid disease Sister   . Colon cancer Neg Hx   . Colon polyps Neg Hx     Social History   Tobacco Use  . Smoking status: Current Every Day Smoker    Packs/day: 0.50    Years: 50.00    Pack years: 25.00    Types: Cigarettes  . Smokeless tobacco: Never Used  Vaping Use  . Vaping Use: Never used  Substance Use Topics  . Alcohol use: Not Currently    Comment:  quit 2019  . Drug use: Not Currently    Types: Marijuana, Cocaine    Comment: cocaine last use 10/26/2018, Marijuana sping 2019    Home Medications Prior to Admission medications   Medication Sig Start Date End Date Taking? Authorizing Provider  ALPRAZolam Duanne Moron) 0.5 MG tablet Take 0.5 mg by mouth at bedtime as needed. 06/10/19   [provider]  blood glucose meter kit and supplies Dispense based on patient and insurance preference. Use up to four times daily as directed. (FOR ICD-10 E10.9, E11.9). 09/03/18   Johnson, Clanford L, MD  doxepin (SINEQUAN) 75 MG capsule Take 75-150 mg by mouth at bedtime as needed. 10/08/19   [provider]  gabapentin (NEURONTIN) 300 MG capsule Take 300 mg by mouth 3 (three) times daily. 05/22/19   [provider]  glipiZIDE (GLUCOTROL XL) 5 MG 24 hr tablet Take 5 mg by mouth daily. 06/10/19   [provider]  LEVEMIR 100 UNIT/ML injection Inject 0.4 mLs (40 Units total) into the skin daily. 09/03/18   Johnson, Clanford L, MD  metFORMIN (GLUCOPHAGE) 500 MG tablet Take 500 mg by mouth 2 (two) times daily. 10/02/18   [provider]  sertraline (ZOLOFT) 100 MG tablet Take 100 mg by mouth daily. 10/08/19   [provider]  simvastatin (ZOCOR) 10 MG tablet Take 10 mg by mouth daily. 06/10/19   [provider]  Sofosbuvir-Velpatasvir (EPCLUSA) 400-100 MG TABS Take 1 tablet by mouth daily. 01/01/19   Comer, Okey Regal, MD    Allergies    Patient has no known allergies.  Review of Systems   Review of Systems  Constitutional: Negative for chills, fatigue and fever.  HENT: Negative for sore throat and trouble swallowing.   Eyes: Negative for visual disturbance.  Respiratory: Negative for cough, shortness of breath and wheezing.   Cardiovascular: Negative for chest pain and palpitations.  Gastrointestinal: Negative for abdominal pain, diarrhea, nausea and vomiting.  Genitourinary: Negative for dysuria and flank pain.   Musculoskeletal: Positive for arthralgias (Left elbow pain, right hand and wrist pain.) and neck pain. Negative for back pain, myalgias and neck stiffness.  Skin: Negative for rash.  Neurological: Positive for headaches. Negative for dizziness, syncope, speech difficulty, weakness and numbness.  Hematological: Does not bruise/bleed easily.  Psychiatric/Behavioral: Negative for confusion.    Physical Exam Updated Vital Signs BP (!) 169/101 (BP Location: Right Arm)   Pulse 72   Temp 97.8 F (36.6 C) (Oral)   Resp 18   Ht 5' 7" (1.702 m)   Wt 86.2 kg   SpO2 100%   BMI 29.76 kg/m   Physical Exam Vitals and nursing note reviewed.  Constitutional:      General: He is not in acute distress.    Appearance: Normal appearance.  HENT:  Head:     Comments: No scalp hematomas Eyes:     Extraocular Movements: Extraocular movements intact.     Conjunctiva/sclera: Conjunctivae normal.     Pupils: Pupils are equal, round, and reactive to light.  Neck:     Comments: Tenderness palpation of the lower cervical and right cervical paraspinal muscles. Cardiovascular:     Rate and Rhythm: Normal rate and regular rhythm.     Pulses: Normal pulses.  Pulmonary:     Effort: Pulmonary effort is normal.     Breath sounds: Normal breath sounds.  Chest:     Chest wall: No tenderness.  Abdominal:     General: There is no distension.     Palpations: Abdomen is soft.     Tenderness: There is no abdominal tenderness.  Musculoskeletal:        General: Tenderness and signs of injury present.     Cervical back: Tenderness present.     Comments: ttp of the distal right wrist and palmar surface of the right hand.  No open wounds or bony deformity.  Diffuse tenderness with ROM of the left elbow.  No open wounds.    Skin:    General: Skin is warm.     Capillary Refill: Capillary refill takes less than 2 seconds.  Neurological:     General: No focal deficit present.     Mental Status: He is alert.      Sensory: Sensation is intact.     Motor: Motor function is intact.     Coordination: Coordination is intact.     Gait: Gait is intact.     Comments: CN III-XII grossly intact.  Speech clear.  No ataxia.       ED Results / Procedures / Treatments   Labs (all labs ordered are listed, but only abnormal results are displayed) Labs Reviewed - No data to display  EKG None  Radiology DG Elbow Complete Left  Result Date: 06/29/2020 CLINICAL DATA:  Fall out of bed this morning, severe elbow pain. EXAM: LEFT ELBOW - COMPLETE 3+ VIEW COMPARISON:  Plain film of the LEFT elbow dated 10/21/2019. FINDINGS: Stable appearance of the chronic resorption of the distal humerus, proximal radius and proximal ulna. Probable acute posterior subluxation of the olecranon relative to the distal humerus when compared to the earlier exam. Osseous alignment appears otherwise grossly stable. No evidence of a new fracture line or displaced fracture fragment. Soft tissue swelling/edema posterior to the elbow joint. IMPRESSION: 1. Slight posterior displacement of the olecranon relative to the distal humerus, new compared to the previous plain film of 10/21/2019 suggesting acute mild posterior subluxation of the proximal ulna. 2. Stable appearance of the chronic marked resorption of the distal humerus, proximal radius and proximal ulna. No new fracture line or displaced fracture fragment. Electronically Signed   By: Franki Cabot M.D.   On: 06/29/2020 11:12   DG Wrist Complete Right  Result Date: 06/29/2020 CLINICAL DATA:  Fall EXAM: RIGHT WRIST - COMPLETE 3+ VIEW COMPARISON:  None. FINDINGS: Osseous alignment is normal. No fracture line or displaced fracture fragment is seen. No significant degenerative change at the RIGHT wrist. Adjacent soft tissues are unremarkable. IMPRESSION: Negative. Electronically Signed   By: Franki Cabot M.D.   On: 06/29/2020 11:15   CT Head Wo Contrast  Result Date: 06/29/2020 CLINICAL  DATA:  The patient suffered a blow to the head this morning when he fell out bed. Initial encounter. EXAM: CT HEAD WITHOUT CONTRAST CT CERVICAL  SPINE WITHOUT CONTRAST TECHNIQUE: Multidetector CT imaging of the head and cervical spine was performed following the standard protocol without intravenous contrast. Multiplanar CT image reconstructions of the cervical spine were also generated. COMPARISON:  Head and cervical spine CT scan 03/03/2017. Head CT 10/21/2019, 10/31/2019 12/27/2019. FINDINGS: CT HEAD FINDINGS Brain: No evidence of acute infarction, hemorrhage, hydrocephalus, extra-axial collection or mass lesion/mass effect. There is mild atrophy, more notable on the right. Mild dilatation of the ventricular system is also worse on the right. The appearance is unchanged since the most recent head CT scans. Vascular: No hyperdense vessel or unexpected calcification. Skull: Burr holes on the right again seen. No acute bony abnormality or focal lesion. Sinuses/Orbits: Negative. Other: None. CT CERVICAL SPINE FINDINGS Alignment: Maintained. Skull base and vertebrae: No acute fracture. No primary bone lesion or focal pathologic process. Soft tissues and spinal canal: No prevertebral fluid or swelling. No visible canal hematoma. Disc levels: Advanced multilevel degenerative disc disease is worst at C5-6 and C6-7. No change compared to the prior study. Upper chest: Lung apices clear. Other: None. IMPRESSION: No acute abnormality head or cervical spine. Cortical atrophy is worse on the right. Dilatation of ventricular system is likely secondary to atrophy and unchanged since the most recent exams. No change in multilevel cervical spondylosis. Electronically Signed   By: Thomas  Dalessio M.D.   On: 06/29/2020 11:19   CT Cervical Spine Wo Contrast  Result Date: 06/29/2020 CLINICAL DATA:  The patient suffered a blow to the head this morning when he fell out bed. Initial encounter. EXAM: CT HEAD WITHOUT CONTRAST CT  CERVICAL SPINE WITHOUT CONTRAST TECHNIQUE: Multidetector CT imaging of the head and cervical spine was performed following the standard protocol without intravenous contrast. Multiplanar CT image reconstructions of the cervical spine were also generated. COMPARISON:  Head and cervical spine CT scan 03/03/2017. Head CT 10/21/2019, 10/31/2019 12/27/2019. FINDINGS: CT HEAD FINDINGS Brain: No evidence of acute infarction, hemorrhage, hydrocephalus, extra-axial collection or mass lesion/mass effect. There is mild atrophy, more notable on the right. Mild dilatation of the ventricular system is also worse on the right. The appearance is unchanged since the most recent head CT scans. Vascular: No hyperdense vessel or unexpected calcification. Skull: Burr holes on the right again seen. No acute bony abnormality or focal lesion. Sinuses/Orbits: Negative. Other: None. CT CERVICAL SPINE FINDINGS Alignment: Maintained. Skull base and vertebrae: No acute fracture. No primary bone lesion or focal pathologic process. Soft tissues and spinal canal: No prevertebral fluid or swelling. No visible canal hematoma. Disc levels: Advanced multilevel degenerative disc disease is worst at C5-6 and C6-7. No change compared to the prior study. Upper chest: Lung apices clear. Other: None. IMPRESSION: No acute abnormality head or cervical spine. Cortical atrophy is worse on the right. Dilatation of ventricular system is likely secondary to atrophy and unchanged since the most recent exams. No change in multilevel cervical spondylosis. Electronically Signed   By: Thomas  Dalessio M.D.   On: 06/29/2020 11:19   DG Hand Complete Right  Result Date: 06/29/2020 CLINICAL DATA:  Status post fall. EXAM: RIGHT HAND - COMPLETE 3+ VIEW COMPARISON:  None. FINDINGS: No evidence of acute fracture line or displaced fracture fragment. Chronic fracture deformity of the fifth metacarpal bone. Chronic degenerative osteoarthritic changes at the third MCP joint,  at least moderate in degree with associated subchondral cyst formation. IMPRESSION: 1. No acute findings. No acute fracture or dislocation. 2. Old fracture deformity of the fifth metacarpal bone. 3. Degenerative osteoarthritic changes of   the third MCP joint, at least moderate in degree. Electronically Signed   By: Franki Cabot M.D.   On: 06/29/2020 11:14    Procedures Procedures (including critical care time)  Medications Ordered in ED Medications - No data to display  ED Course  I have reviewed the triage vital signs and the nursing notes.  Pertinent labs & imaging results that were available during my care of the patient were reviewed by me and considered in my medical decision making (see chart for details).    MDM Rules/Calculators/A&P                          Pt here for evaluation after a low impact fall from a bed, struck his head on a night stand.  Pt did not lose consciousness, but was asleep when the fall occurred.  Does not take anticoagulants.    CT head and C spine w/o evidence of acute injury.  Imaging of the left elbow shows some subluxation of the proximal ulnar that is new from previous images. This may represent a new injury in the clinical setting of a fall.  I will place him in a sling, wrist splint also applied and he does not wish to see his previous orthopedic provider, will give him info for local orthopedics.  I have discussed head injury instructions and return precautions   Final Clinical Impression(s) / ED Diagnoses Final diagnoses:  Fall, initial encounter  Subluxation of left elbow, initial encounter  Sprain of right hand, initial encounter    Rx / DC Orders ED Discharge Orders    None       Kem Parkinson, PA-C 06/29/20 1343    Truddie Hidden, MD 06/29/20 1421

## 2020-06-29 NOTE — ED Triage Notes (Signed)
Pt presents to ED after falling out of bed this morning. Pt states he hit his head and had LOC. Pt c/o bilateral arm pain. Pt states he feels dizzy. Pt denies blood thinners.

## 2020-06-29 NOTE — Discharge Instructions (Addendum)
Wear the sling and brace as needed.  You may remove for bathing.  Call the orthopedic provider listed to arrange a follow-up appt.

## 2020-07-08 ENCOUNTER — Other Ambulatory Visit: Payer: Self-pay

## 2020-07-08 ENCOUNTER — Other Ambulatory Visit (HOSPITAL_COMMUNITY): Payer: Self-pay | Admitting: Internal Medicine

## 2020-07-08 ENCOUNTER — Ambulatory Visit (HOSPITAL_COMMUNITY)
Admission: RE | Admit: 2020-07-08 | Discharge: 2020-07-08 | Disposition: A | Discharge status: Home / Self Care | Payer: Medicare Other | Source: Ambulatory Visit | Attending: Internal Medicine | Admitting: Internal Medicine

## 2020-07-08 DIAGNOSIS — M869 Osteomyelitis, unspecified: Secondary | ICD-10-CM | POA: Diagnosis not present

## 2020-07-08 DIAGNOSIS — G9389 Other specified disorders of brain: Secondary | ICD-10-CM | POA: Diagnosis not present

## 2020-07-08 DIAGNOSIS — S065X9A Traumatic subdural hemorrhage with loss of consciousness of unspecified duration, initial encounter: Secondary | ICD-10-CM | POA: Diagnosis not present

## 2020-07-08 DIAGNOSIS — G319 Degenerative disease of nervous system, unspecified: Secondary | ICD-10-CM | POA: Diagnosis not present

## 2020-07-08 DIAGNOSIS — I6782 Cerebral ischemia: Secondary | ICD-10-CM | POA: Diagnosis not present

## 2020-07-08 DIAGNOSIS — Z299 Encounter for prophylactic measures, unspecified: Secondary | ICD-10-CM | POA: Diagnosis not present

## 2020-07-08 DIAGNOSIS — R569 Unspecified convulsions: Secondary | ICD-10-CM

## 2020-07-08 DIAGNOSIS — R972 Elevated prostate specific antigen [PSA]: Secondary | ICD-10-CM | POA: Diagnosis not present

## 2020-07-08 DIAGNOSIS — F17210 Nicotine dependence, cigarettes, uncomplicated: Secondary | ICD-10-CM | POA: Diagnosis not present

## 2020-07-08 MED ORDER — GADOBUTROL 1 MMOL/ML IV SOLN
7.5000 mL | Freq: Once | INTRAVENOUS | Status: AC | PRN
  Administered 2020-07-08: 7.5 mL via INTRAVENOUS

## 2020-07-16 ENCOUNTER — Emergency Department (HOSPITAL_COMMUNITY): Payer: Medicare Other

## 2020-07-16 ENCOUNTER — Encounter (HOSPITAL_COMMUNITY): Payer: Self-pay

## 2020-07-16 ENCOUNTER — Emergency Department (HOSPITAL_COMMUNITY)
Admission: EM | Admit: 2020-07-16 | Discharge: 2020-07-17 | Disposition: A | Discharge status: Home / Self Care | Payer: Medicare Other | Attending: Emergency Medicine | Admitting: Emergency Medicine

## 2020-07-16 ENCOUNTER — Other Ambulatory Visit: Payer: Self-pay

## 2020-07-16 DIAGNOSIS — R569 Unspecified convulsions: Secondary | ICD-10-CM | POA: Diagnosis not present

## 2020-07-16 DIAGNOSIS — Z7984 Long term (current) use of oral hypoglycemic drugs: Secondary | ICD-10-CM | POA: Insufficient documentation

## 2020-07-16 DIAGNOSIS — R0689 Other abnormalities of breathing: Secondary | ICD-10-CM | POA: Diagnosis not present

## 2020-07-16 DIAGNOSIS — E119 Type 2 diabetes mellitus without complications: Secondary | ICD-10-CM | POA: Insufficient documentation

## 2020-07-16 DIAGNOSIS — Z20822 Contact with and (suspected) exposure to covid-19: Secondary | ICD-10-CM | POA: Diagnosis not present

## 2020-07-16 DIAGNOSIS — Z79899 Other long term (current) drug therapy: Secondary | ICD-10-CM | POA: Diagnosis not present

## 2020-07-16 DIAGNOSIS — R404 Transient alteration of awareness: Secondary | ICD-10-CM | POA: Diagnosis not present

## 2020-07-16 DIAGNOSIS — F17210 Nicotine dependence, cigarettes, uncomplicated: Secondary | ICD-10-CM | POA: Diagnosis not present

## 2020-07-16 DIAGNOSIS — R0902 Hypoxemia: Secondary | ICD-10-CM | POA: Diagnosis not present

## 2020-07-16 DIAGNOSIS — Z794 Long term (current) use of insulin: Secondary | ICD-10-CM | POA: Diagnosis not present

## 2020-07-16 DIAGNOSIS — G40909 Epilepsy, unspecified, not intractable, without status epilepticus: Secondary | ICD-10-CM | POA: Diagnosis not present

## 2020-07-16 DIAGNOSIS — R4020 Unspecified coma: Secondary | ICD-10-CM | POA: Diagnosis not present

## 2020-07-16 DIAGNOSIS — I10 Essential (primary) hypertension: Secondary | ICD-10-CM | POA: Diagnosis not present

## 2020-07-16 LAB — COMPREHENSIVE METABOLIC PANEL
ALT: 11 U/L (ref 0–44)
AST: 20 U/L (ref 15–41)
Albumin: 3 g/dL — ABNORMAL LOW (ref 3.5–5.0)
Alkaline Phosphatase: 68 U/L (ref 38–126)
Anion gap: 11 (ref 5–15)
BUN: 21 mg/dL (ref 8–23)
CO2: 17 mmol/L — ABNORMAL LOW (ref 22–32)
Calcium: 8.3 mg/dL — ABNORMAL LOW (ref 8.9–10.3)
Chloride: 107 mmol/L (ref 98–111)
Creatinine, Ser: 1.12 mg/dL (ref 0.61–1.24)
GFR, Estimated: >60 mL/min (ref >60)
Glucose, Bld: 146 mg/dL — ABNORMAL HIGH (ref 70–99)
Potassium: 3.8 mmol/L (ref 3.5–5.1)
Sodium: 135 mmol/L (ref 135–145)
Total Bilirubin: 0.9 mg/dL (ref 0.3–1.2)
Total Protein: 7.4 g/dL (ref 6.5–8.1)

## 2020-07-16 LAB — RAPID URINE DRUG SCREEN, HOSP PERFORMED
Amphetamines: NOT DETECTED
Barbiturates: NOT DETECTED
Benzodiazepines: POSITIVE — AB
Cocaine: POSITIVE — AB
Opiates: NOT DETECTED
Tetrahydrocannabinol: NOT DETECTED

## 2020-07-16 LAB — URINALYSIS, ROUTINE W REFLEX MICROSCOPIC
Bilirubin Urine: NEGATIVE
Glucose, UA: 50 mg/dL — AB
Ketones, ur: NEGATIVE mg/dL
Leukocytes,Ua: NEGATIVE
Nitrite: NEGATIVE
Protein, ur: >=300 mg/dL — AB
Specific Gravity, Urine: 1.018 (ref 1.005–1.030)
pH: 5 (ref 5.0–8.0)

## 2020-07-16 LAB — CBC WITH DIFFERENTIAL/PLATELET
Abs Immature Granulocytes: 0.03 10*3/uL (ref 0.00–0.07)
Basophils Absolute: 0 10*3/uL (ref 0.0–0.1)
Basophils Relative: 0 %
Eosinophils Absolute: 0.1 10*3/uL (ref 0.0–0.5)
Eosinophils Relative: 1 %
HCT: 41.2 % (ref 39.0–52.0)
Hemoglobin: 14 g/dL (ref 13.0–17.0)
Immature Granulocytes: 0 %
Lymphocytes Relative: 22 %
Lymphs Abs: 2 10*3/uL (ref 0.7–4.0)
MCH: 31.2 pg (ref 26.0–34.0)
MCHC: 34 g/dL (ref 30.0–36.0)
MCV: 91.8 fL (ref 80.0–100.0)
Monocytes Absolute: 0.6 10*3/uL (ref 0.1–1.0)
Monocytes Relative: 7 %
Neutro Abs: 6.3 10*3/uL (ref 1.7–7.7)
Neutrophils Relative %: 70 %
Platelets: 137 10*3/uL — ABNORMAL LOW (ref 150–400)
RBC: 4.49 MIL/uL (ref 4.22–5.81)
RDW: 14.8 % (ref 11.5–15.5)
WBC: 9.1 10*3/uL (ref 4.0–10.5)
nRBC: 0 % (ref 0.0–0.2)

## 2020-07-16 LAB — CBG MONITORING, ED: Glucose-Capillary: 143 mg/dL — ABNORMAL HIGH (ref 70–99)

## 2020-07-16 LAB — POC SARS CORONAVIRUS 2 AG -  ED: SARS Coronavirus 2 Ag: NEGATIVE

## 2020-07-16 LAB — ETHANOL: Alcohol, Ethyl (B): <10 mg/dL (ref <10)

## 2020-07-16 MED ORDER — FOLIC ACID 5 MG/ML IJ SOLN
1.0000 mg | Freq: Once | INTRAMUSCULAR | Status: DC
  Filled 2020-07-16: qty 0.2

## 2020-07-16 MED ORDER — THIAMINE HCL 100 MG/ML IJ SOLN
100.0000 mg | Freq: Once | INTRAMUSCULAR | Status: AC
  Administered 2020-07-16: 100 mg via INTRAVENOUS
  Filled 2020-07-16: qty 2

## 2020-07-16 MED ORDER — M.V.I. ADULT IV INJ
INJECTION | INTRAVENOUS | Status: AC
  Filled 2020-07-16: qty 10

## 2020-07-16 MED ORDER — LORAZEPAM 2 MG/ML IJ SOLN
2.0000 mg | Freq: Once | INTRAMUSCULAR | Status: AC
  Administered 2020-07-16: 2 mg via INTRAVENOUS
  Filled 2020-07-16: qty 1

## 2020-07-16 MED ORDER — FOLIC ACID 5 MG/ML IJ SOLN
1.0000 mg | Freq: Once | INTRAMUSCULAR | Status: AC
  Administered 2020-07-16: 1 mg via INTRAVENOUS
  Filled 2020-07-16: qty 0.2

## 2020-07-16 MED ORDER — M.V.I. ADULT IV INJ
Freq: Once | INTRAVENOUS | Status: AC
  Filled 2020-07-16: qty 10

## 2020-07-16 NOTE — Discharge Instructions (Signed)
Return if any problems.

## 2020-07-16 NOTE — ED Notes (Signed)
Mittens removed at this time. Pt is currently calm and cooperative at this time. Sitter remains at bedside for safety at this time.

## 2020-07-16 NOTE — ED Notes (Addendum)
Order obtain for non-violent restraints due to patient's continued combativeness and pulling at tubes, lines and drains.  Pt placed in four point non-violent restraints at this time with 1:1 sitter at bedside.  Airway cart, intubation equipment, suction and resuscitation equipment at bedside. Pt in room located closest to the nurses station, door closed and curtains opened for better observation.  This RN educated pt on the need for restraints, put unable to fully verbalized understanding at this time. This RN remains at bedside at this time.

## 2020-07-16 NOTE — ED Notes (Signed)
With assistance from 1:1 sitter, patient repositioned in bed, rang of motion performed to bilateral wrists. Pt provided with a warm blanket and pillow. Bed remains locked in the lowest position, side rails x2, 1:1 sitter at bedside, bed rails remain padded at this time. Pt remains in room closest to nurses station for better observation. Will continue observation and monitoring at this time.

## 2020-07-16 NOTE — ED Notes (Signed)
Pt transported to and from CT by this RN, 1:1 safety sitter and CT staff at this time.

## 2020-07-16 NOTE — ED Triage Notes (Addendum)
Pt arrives EMS coming from a car park.  Reports that patient has had new onset seizure activity for the past 6 months. Reports not being on any medications or having seen a neurologist for activity.  Reports that mother received a call from him saying that "he's been having several small seizures today." EMS reports witnessing x2 en route, each lasting one minute.  Received 5mg  Versed with EMS.  Pt arrives moaning and screaming, pulling at pants and clothes. Reports that patient is intermittently combative.
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
## 2020-07-16 NOTE — ED Notes (Signed)
Juan Garbe, PA made aware that this RN notes movement of all four extremities, but neglect of left side. On assessment, pt is alert to city, who he lives with and year. Pt is able to follow commands to the right side but is unable to follow commands to the left side.  Educated that patient remains agitated, pulling at tubes, lines and drains. New orders obtained at this time. Staff remains at bedside.

## 2020-07-16 NOTE — ED Notes (Signed)
PA to bedside for assessment at this time. 

## 2020-07-16 NOTE — ED Notes (Addendum)
Pt remains agitated and moaning. Pt pulling at place IV and urinary catheter. [place] applied to left hand and right hand to prevent pulling at tubes, lines and drains. Staff remains at bedside for safety at this time.  Bed rails and floors padded at this time, close observation maintained.

## 2020-07-16 NOTE — ED Notes (Signed)
This RN to bedside for rounding. Pt appears less restless and agitated at this time. Bilateral soft ankle restraints removed at this time and educated on wrist restraints purpose and criteria for removal. Pt appears alert and oriented x2. 1:1 safety sitter remains at bedside for close observation.

## 2020-07-16 NOTE — ED Notes (Addendum)
MD to bedside at this time 

## 2020-07-16 NOTE — ED Notes (Signed)
Pt ambulated from bed to wheel chair. Pt had unsteady gait but needed minimal assistance from ED Staff.
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
## 2020-07-25 DIAGNOSIS — F331 Major depressive disorder, recurrent, moderate: Secondary | ICD-10-CM | POA: Diagnosis not present

## 2020-07-25 DIAGNOSIS — F411 Generalized anxiety disorder: Secondary | ICD-10-CM | POA: Diagnosis not present

## 2020-08-10 DIAGNOSIS — Z79899 Other long term (current) drug therapy: Secondary | ICD-10-CM | POA: Diagnosis not present

## 2020-08-10 DIAGNOSIS — K7460 Unspecified cirrhosis of liver: Secondary | ICD-10-CM | POA: Diagnosis not present

## 2020-08-29 ENCOUNTER — Ambulatory Visit (INDEPENDENT_AMBULATORY_CARE_PROVIDER_SITE_OTHER): Payer: Medicare Other | Admitting: Urology

## 2020-08-29 ENCOUNTER — Encounter: Payer: Self-pay | Admitting: Urology

## 2020-08-29 ENCOUNTER — Other Ambulatory Visit: Payer: Self-pay

## 2020-08-29 VITALS — BP 149/82 | HR 67 | Temp 97.8°F | Ht 67.0 in | Wt 190.0 lb

## 2020-08-29 DIAGNOSIS — R972 Elevated prostate specific antigen [PSA]: Secondary | ICD-10-CM | POA: Diagnosis not present

## 2020-08-29 LAB — MICROSCOPIC EXAMINATION
Epithelial Cells (non renal): NONE SEEN /hpf (ref 0–10)
Renal Epithel, UA: NONE SEEN /hpf
WBC, UA: NONE SEEN /hpf (ref 0–5)

## 2020-08-29 LAB — URINALYSIS, ROUTINE W REFLEX MICROSCOPIC
Bilirubin, UA: NEGATIVE
Glucose, UA: NEGATIVE
Leukocytes,UA: NEGATIVE
Nitrite, UA: NEGATIVE
Specific Gravity, UA: 1.025 (ref 1.005–1.030)
Urobilinogen, Ur: 0.2 mg/dL (ref 0.2–1.0)
pH, UA: 5.5 (ref 5.0–7.5)

## 2020-08-29 MED ORDER — LEVOFLOXACIN 750 MG PO TABS: 750.0000 mg | ORAL_TABLET | Freq: Once | ORAL | 0 refills | Status: AC

## 2020-08-29 MED ORDER — LEVOFLOXACIN 750 MG PO TABS: 750.0000 mg | ORAL_TABLET | Freq: Once | ORAL | 0 refills | Status: DC

## 2020-08-29 NOTE — Progress Notes (Signed)
Urological Symptom Review  Patient is experiencing the following symptoms: Get up at night to urinate   Review of Systems  Gastrointestinal (upper)  : Negative for upper GI symptoms  Gastrointestinal (lower) : Negative for lower GI symptoms  Constitutional : Negative for symptoms  Skin: Negative for skin symptoms  Eyes: Negative for eye symptoms  Ear/Nose/Throat : Negative for Ear/Nose/Throat symptoms  Hematologic/Lymphatic: Negative for Hematologic/Lymphatic symptoms  Cardiovascular : Negative for cardiovascular symptoms  Respiratory : Negative for respiratory symptoms  Endocrine: Negative for endocrine symptoms  Musculoskeletal: Back pain Joint pain  Neurological: Negative for neurological symptoms  Psychologic: Depression Anxiety

## 2020-08-29 NOTE — Patient Instructions (Signed)
   Appointment Time: 12:15pm Please arrive by 12:00 noon Appointment Date: 08/31/2020   Location: [facility] Radiology Department   Prostate Biopsy Instructions  Stop all aspirin or blood thinners (aspirin, plavix, coumadin, warfarin, motrin, ibuprofen, advil, aleve, naproxen, naprosyn) for 7 days prior to the procedure.  If you have any questions about stopping these medications, please contact your primary care physician or cardiologist.  Having a light meal prior to the procedure is recommended.  If you are diabetic or have low blood sugar please bring a small snack or glucose tablet.  A Fleets enema is needed to be purchased over the counter at a local pharmacy and used 2 hours before you scheduled appointment.  This can be purchased over the counter at any pharmacy.  Antibiotics will be administered in the clinic at the time of the procedure and 1 tablet has been sent to your pharmacy. Please take the antibiotic as prescribed.    Please bring someone with you to the procedure to drive you home if you are given a valium to take prior to your procedure.   If you have any questions or concerns, please feel free to call the office at [phone] or send a Mychart message.    Thank you, [facility]

## 2020-08-29 NOTE — Progress Notes (Signed)
08/29/2020 1:02 PM   Juan Juarez 08-27-1955 [id number]  Referring provider: Glenda Chroman, MD [place],  [place] [id number]  Elevated PSA  HPI: Juan Juarez is a 65yo here for evaluation of elevated PSA. PSA was 19.4 in 06/2020. He denies any LUTS. IPSS 2 with QOL 2. No family hx of prostate cancer. No dysuria or hematuria. No other prior PSAs. He has a hx of polysubstance abuse.    PMH: Past Medical History:  Diagnosis Date  . Anxiety   . Chronic elbow pain, left   . Depression   . Diabetes mellitus, type II ([place])    type II  . Gout   . Head injury 12/2017  . Hepatitis C without hepatic coma   . Hypertension    denies  . Inflammatory arthropathy    left elbow  . Insomnia   . Non compliance w medication regimen   . Osteoarthritis   . Polysubstance abuse ([place])   . Protein-calorie malnutrition, severe ([place]) 09/13/2016  . Seizure ([place]) 12/2017   after head Injury  . Thrombocytopenia [facility])     Surgical History: Past Surgical History:  Procedure Laterality Date  . BURR HOLE Right 01/22/2018   Procedure: RIGHT BURR HOLES;  Surgeon: Newman Pies, MD;  Location: [place];  Service: Neurosurgery;  Laterality: Right;  . I & D EXTREMITY Left 11/06/2018   Procedure: IRRIGATION AND DEBRIDEMENT LEFT ELBOW JOINT;  Surgeon: Charlotte Crumb, MD;  Location: [place];  Service: Orthopedics;  Laterality: Left;  . INCISION AND DRAINAGE Left 10/24/2018   Procedure: INCISION AND DRAINAGE LEFT ELBOW WITH MASS EXCISION;  Surgeon: Charlotte Crumb, MD;  Location: [place];  Service: Orthopedics;  Laterality: Left;  Marland Kitchen MULTIPLE TOOTH EXTRACTIONS    . None      Home Medications:  Allergies as of 08/29/2020   No Known Allergies     Medication List       Accurate as of August 29, 2020  1:02 PM. If you have any questions, ask your nurse or doctor.        STOP taking these medications   oxyCODONE-acetaminophen 5-325 MG tablet Commonly known as: PERCOCET/ROXICET Stopped by: Nicolette Bang, MD   Sofosbuvir-Velpatasvir 400-100 MG Tabs Commonly known as: Raeanne Gathers Stopped by: Nicolette Bang, MD     TAKE these medications   ALPRAZolam 0.5 MG tablet Commonly known as: XANAX Take 0.5 mg by mouth at bedtime as needed.   blood glucose meter kit and supplies Dispense based on patient and insurance preference. Use up to four times daily as directed. (FOR ICD-10 E10.9, E11.9).   cloBAZam 10 MG tablet Commonly known as: ONFI clobazam 10 mg tablet  TAKE ONE TABLET BY MOUTH EVERY DAY   doxepin 75 MG capsule Commonly known as: SINEQUAN Take 75 mg by mouth. What changed: Another medication with the same name was removed. Continue taking this medication, and follow the directions you see here. Changed by: Nicolette Bang, MD   glipiZIDE 5 MG 24 hr tablet Commonly known as: GLUCOTROL XL Take 5 mg by mouth daily.   Levemir 100 UNIT/ML injection Generic drug: insulin detemir Inject 0.4 mLs (40 Units total) into the skin daily.   metFORMIN 500 MG tablet Commonly known as: GLUCOPHAGE Take 500 mg by mouth 2 (two) times daily.   sertraline 100 MG tablet Commonly known as: ZOLOFT Take 100 mg by mouth daily.   simvastatin 10 MG tablet Commonly known as: ZOCOR Take 10 mg by mouth daily.  Allergies: No Known Allergies  Family History: Family History  Problem Relation Age of Onset  . Thyroid disease Sister   . Colon cancer Neg Hx   . Colon polyps Neg Hx     Social History:  reports that he has been smoking cigarettes. He has a 25.00 pack-year smoking history. He has never used smokeless tobacco. He reports previous alcohol use. He reports previous drug use. Drugs: Marijuana and Cocaine.  ROS: All other review of systems were reviewed and are negative except what is noted above in HPI  Physical Exam: BP (!) 149/82   Pulse 67   Temp 97.8 F (36.6 C)   Ht $R'5\' 7"'zE$  (1.702 m)   Wt 190 lb (86.2 kg)   BMI 29.76 kg/m   Constitutional:  Alert and  oriented, No acute distress. HEENT: [place] AT, moist mucus membranes.  Trachea midline, no masses. Cardiovascular: No clubbing, cyanosis, or edema. Respiratory: Normal respiratory effort, no increased work of breathing. GI: Abdomen is soft, nontender, nondistended, no abdominal masses GU: No CVA tenderness. Circumcised phallus. No masses/lesions on penis, testis, scrotum. Prostate 40g smooth no nodules no induration.  Lymph: No cervical or inguinal lymphadenopathy. Skin: No rashes, bruises or suspicious lesions. Neurologic: Grossly intact, no focal deficits, moving all 4 extremities. Psychiatric: Normal mood and affect.  Laboratory Data: Lab Results  Component Value Date   WBC 9.1 07/16/2020   HGB 14.0 07/16/2020   HCT 41.2 07/16/2020   MCV 91.8 07/16/2020   PLT 137 (L) 07/16/2020    Lab Results  Component Value Date   CREATININE 1.12 07/16/2020    No results found for: PSA  No results found for: TESTOSTERONE  Lab Results  Component Value Date   HGBA1C 11.5 (H) 09/02/2018    Urinalysis    Component Value Date/Time   COLORURINE YELLOW 07/16/2020 1740   APPEARANCEUR CLEAR 07/16/2020 1740   LABSPEC 1.018 07/16/2020 1740   PHURINE 5.0 07/16/2020 1740   GLUCOSEU 50 (A) 07/16/2020 1740   HGBUR SMALL (A) 07/16/2020 1740   BILIRUBINUR NEGATIVE 07/16/2020 1740   KETONESUR NEGATIVE 07/16/2020 1740   PROTEINUR >=300 (A) 07/16/2020 1740   UROBILINOGEN 0.2 07/01/2014 2244   NITRITE NEGATIVE 07/16/2020 1740   LEUKOCYTESUR NEGATIVE 07/16/2020 1740    Lab Results  Component Value Date   BACTERIA RARE (A) 07/16/2020    Pertinent Imaging:  No results found for this or any previous visit.  No results found for this or any previous visit.  No results found for this or any previous visit.  No results found for this or any previous visit.  No results found for this or any previous visit.  No results found for this or any previous visit.  No results found for this or any  previous visit.  No results found for this or any previous visit.   Assessment & Plan:    1. Elevated PSA The patient and I talked about etiologies of elevated PSA.  We discussed the possible relationship between elevated PSA, prostate cancer, BPH, prostatitis, and UTI.   Conservative treatment of elevated PSA with watchful waiting was discussed with the patient.  All questions were answered.        All of the risks and benefits along with alternatives to prostate biopsy were discussed with the patient.  The patient gave fully informed consent to proceed with a transrectal ultrasound guided biopsy of the prostate for the evaluation of their evated PSA.  Prostate biopsy instructions and antibiotics were given to  the patient.  - Urinalysis, Routine w reflex microscopic   No follow-ups on file.  Nicolette Bang, MD  [facility] [place]

## 2020-08-31 ENCOUNTER — Encounter (HOSPITAL_COMMUNITY): Payer: Self-pay

## 2020-08-31 ENCOUNTER — Other Ambulatory Visit: Payer: Self-pay | Admitting: Urology

## 2020-08-31 ENCOUNTER — Ambulatory Visit (HOSPITAL_COMMUNITY)
Admission: RE | Admit: 2020-08-31 | Discharge: 2020-08-31 | Disposition: A | Discharge status: Home / Self Care | Payer: Medicare Other | Source: Ambulatory Visit | Attending: Urology | Admitting: Urology

## 2020-08-31 ENCOUNTER — Ambulatory Visit (INDEPENDENT_AMBULATORY_CARE_PROVIDER_SITE_OTHER): Payer: Medicare Other | Admitting: Urology

## 2020-08-31 ENCOUNTER — Encounter: Payer: Self-pay | Admitting: Urology

## 2020-08-31 ENCOUNTER — Other Ambulatory Visit: Payer: Self-pay

## 2020-08-31 DIAGNOSIS — R972 Elevated prostate specific antigen [PSA]: Secondary | ICD-10-CM

## 2020-08-31 MED ORDER — GENTAMICIN SULFATE 40 MG/ML IJ SOLN: 80.0000 mg | Freq: Once | INTRAMUSCULAR | Status: AC

## 2020-08-31 MED ORDER — GENTAMICIN SULFATE 40 MG/ML IJ SOLN
INTRAMUSCULAR | Status: AC
  Administered 2020-08-31: 80 mg via INTRAMUSCULAR
  Filled 2020-08-31: qty 2

## 2020-08-31 MED ORDER — LIDOCAINE HCL (PF) 2 % IJ SOLN: 10.0000 mL | Freq: Once | INTRAMUSCULAR | Status: AC

## 2020-08-31 MED ORDER — LIDOCAINE HCL (PF) 2 % IJ SOLN
INTRAMUSCULAR | Status: AC
  Administered 2020-08-31: 10 mL
  Filled 2020-08-31: qty 10

## 2020-08-31 NOTE — Discharge Instructions (Signed)

## 2020-08-31 NOTE — Patient Instructions (Signed)

## 2020-08-31 NOTE — Progress Notes (Signed)
Prostate Biopsy Procedure   Informed consent was obtained after discussing risks/benefits of the procedure.  A time out was performed to ensure correct patient identity.  Pre-Procedure: - Last PSA Level: No results found for: "PSA" - Gentamicin given prophylactically - Levaquin 500 mg administered PO -Transrectal Ultrasound performed revealing a 36.4 gm prostate -No significant hypoechoic or median lobe noted  Procedure: - Prostate block performed using 10 cc 1% lidocaine and biopsies taken from sextant areas, a total of 12 under ultrasound guidance.  Post-Procedure: - Patient tolerated the procedure well - He was counseled to seek immediate medical attention if experiences any severe pain, significant bleeding, or fevers - Return in one week to discuss biopsy results  

## 2020-08-31 NOTE — Sedation Documentation (Signed)
PT tolerated prostate biopsy procedure and IM antibiotic medication well today. Labs obtained and sent for pathology. PT ambulatory at discharge with no acute distress noted and verbalized understanding of discharge instructions. PT to follow up with urologist.

## 2020-09-05 ENCOUNTER — Ambulatory Visit (HOSPITAL_COMMUNITY): Admission: RE | Admit: 2020-09-05 | Payer: Medicare Other | Source: Ambulatory Visit

## 2020-09-09 ENCOUNTER — Encounter: Payer: Self-pay | Admitting: Urology

## 2020-09-09 ENCOUNTER — Ambulatory Visit (INDEPENDENT_AMBULATORY_CARE_PROVIDER_SITE_OTHER): Payer: Medicare Other | Admitting: Urology

## 2020-09-09 ENCOUNTER — Other Ambulatory Visit: Payer: Self-pay

## 2020-09-09 VITALS — BP 178/70 | HR 60 | Temp 97.5°F | Ht 67.0 in | Wt 190.0 lb

## 2020-09-09 DIAGNOSIS — C61 Malignant neoplasm of prostate: Secondary | ICD-10-CM | POA: Diagnosis not present

## 2020-09-09 NOTE — Patient Instructions (Signed)
Prostate Cancer  The prostate is a male gland that helps make semen. It is located below a man's bladder, in front of the rectum. Prostate cancer is when abnormal cells grow in this gland. What are the causes? The cause of this condition is not known. What increases the risk? You are more likely to develop this condition if:  You are 66 years of age or older.  You are African American.  You have a family history of prostate cancer.  You have a family history of breast cancer. What are the signs or symptoms? Symptoms of this condition include:  A need to pee often.  Peeing that is weak, or pee that stops and starts.  Trouble starting or stopping your pee.  Inability to pee.  Blood in your pee or semen.  Pain in the lower back, lower belly (abdomen), hips, or upper thighs.  Trouble getting an erection.  Trouble emptying all of your pee. How is this treated? Treatment for this condition depends on your age, your health, the kind of treatment you like, and how far the cancer has spread. Treatments include:  Being watched. This is called observation. You will be tested from time to time, but you will not get treated. Tests are to make sure that the cancer is not growing.  Surgery. This may be done to remove the prostate, to remove the testicles, or to freeze or kill cancer cells.  Radiation. This uses a strong beam to kill cancer cells.  Ultrasound energy. This uses strong sound waves to kill cancer cells.  Chemotherapy. This uses medicines that stop cancer cells from increasing. This kills cancer cells and healthy cells.  Targeted therapy. This kills cancer cells only. Healthy cells are not affected.  Hormone treatment. This stops the body from making hormones that help the cancer cells to grow. Follow these instructions at home:  Take over-the-counter and prescription medicines only as told by your doctor.  Eat a healthy diet.  Get plenty of sleep.  Ask your  doctor for help to find a support group for men with prostate cancer.  If you have to go to the hospital, let your cancer doctor (oncologist) know.  Treatment may affect your ability to have sex. Touch, hold, hug, and caress your partner to have intimate moments.  Keep all follow-up visits as told by your doctor. This is important. Contact a doctor if:  You have new or more trouble peeing.  You have new or more blood in your pee.  You have new or more pain in your hips, back, or chest. Get help right away if:  You have weakness in your legs.  You lose feeling in your legs.  You cannot control your pee or your poop (stool).  You have chills or a fever. Summary  The prostate is a male gland that helps make semen.  Prostate cancer is when abnormal cells grow in this gland.  Treatment includes doing surgery, using medicines, using very strong beams, or watching without treatment.  Ask your doctor for help to find a support group for men with prostate cancer.  Contact a doctor if you have problems peeing or have any new pain that you did not have before. This information is not intended to replace advice given to you by your health care provider. Make sure you discuss any questions you have with your health care provider. Document Revised: 06/02/2019 Document Reviewed: 06/02/2019 Elsevier Patient Education  2021 [employer] Inc[employer]  

## 2020-09-09 NOTE — Progress Notes (Signed)
09/09/2020 3:29 PM   Juan Juarez 10-Aug-1955 [id number]  Referring provider: Glenda Chroman, MD [place],  [place] [id number]  Followup prostate biopsy  HPI: Mr Juan Juarez is a 48EE here for followup after prostate biopsy. Biopsy revealed Gleason 4+4=8 in 1/12 cores, Gleason 4+3=7 in 8/12 cores and Gleason 3+4=7 in 3/12 cores. PSA 19.4   PMH: Past Medical History:  Diagnosis Date  . Anxiety   . Chronic elbow pain, left   . Depression   . Diabetes mellitus, type II ([place])    type II  . Gout   . Head injury 12/2017  . Hepatitis C without hepatic coma   . Hypertension    denies  . Inflammatory arthropathy    left elbow  . Insomnia   . Non compliance w medication regimen   . Osteoarthritis   . Polysubstance abuse ([place])   . Protein-calorie malnutrition, severe ([place]) 09/13/2016  . Seizure ([place]) 12/2017   after head Injury  . Thrombocytopenia [facility])     Surgical History: Past Surgical History:  Procedure Laterality Date  . BURR HOLE Right 01/22/2018   Procedure: RIGHT BURR HOLES;  Surgeon: Newman Pies, MD;  Location: [place];  Service: Neurosurgery;  Laterality: Right;  . I & D EXTREMITY Left 11/06/2018   Procedure: IRRIGATION AND DEBRIDEMENT LEFT ELBOW JOINT;  Surgeon: Charlotte Crumb, MD;  Location: [place];  Service: Orthopedics;  Laterality: Left;  . INCISION AND DRAINAGE Left 10/24/2018   Procedure: INCISION AND DRAINAGE LEFT ELBOW WITH MASS EXCISION;  Surgeon: Charlotte Crumb, MD;  Location: [place];  Service: Orthopedics;  Laterality: Left;  Marland Kitchen MULTIPLE TOOTH EXTRACTIONS    . None      Home Medications:  Allergies as of 09/09/2020   No Known Allergies     Medication List       Accurate as of September 09, 2020  3:29 PM. If you have any questions, ask your nurse or doctor.        ALPRAZolam 0.5 MG tablet Commonly known as: XANAX Take 0.5 mg by mouth at bedtime as needed.   blood glucose meter kit and supplies Dispense based on patient and insurance  preference. Use up to four times daily as directed. (FOR ICD-10 E10.9, E11.9).   cloBAZam 10 MG tablet Commonly known as: ONFI clobazam 10 mg tablet  TAKE ONE TABLET BY MOUTH EVERY DAY   doxepin 75 MG capsule Commonly known as: SINEQUAN Take 75 mg by mouth.   glipiZIDE 5 MG 24 hr tablet Commonly known as: GLUCOTROL XL Take 5 mg by mouth daily.   Levemir 100 UNIT/ML injection Generic drug: insulin detemir Inject 0.4 mLs (40 Units total) into the skin daily.   metFORMIN 500 MG tablet Commonly known as: GLUCOPHAGE Take 500 mg by mouth 2 (two) times daily.   sertraline 100 MG tablet Commonly known as: ZOLOFT Take 100 mg by mouth daily.   simvastatin 10 MG tablet Commonly known as: ZOCOR Take 10 mg by mouth daily.       Allergies: No Known Allergies  Family History: Family History  Problem Relation Age of Onset  . Thyroid disease Sister   . Colon cancer Neg Hx   . Colon polyps Neg Hx     Social History:  reports that he has been smoking cigarettes. He has a 25.00 pack-year smoking history. He has never used smokeless tobacco. He reports previous alcohol use. He reports previous drug use. Drugs: Marijuana and Cocaine.  ROS: All other  review of systems were reviewed and are negative except what is noted above in HPI  Physical Exam: BP (!) 178/70   Pulse 60   Temp (!) 97.5 F (36.4 C)   Ht $R'5\' 7"'MI$  (1.702 m)   Wt 190 lb (86.2 kg)   BMI 29.76 kg/m   Constitutional:  Alert and oriented, No acute distress. HEENT: [place] AT, moist mucus membranes.  Trachea midline, no masses. Cardiovascular: No clubbing, cyanosis, or edema. Respiratory: Normal respiratory effort, no increased work of breathing. GI: Abdomen is soft, nontender, nondistended, no abdominal masses GU: No CVA tenderness.  Lymph: No cervical or inguinal lymphadenopathy. Skin: No rashes, bruises or suspicious lesions. Neurologic: Grossly intact, no focal deficits, moving all 4 extremities. Psychiatric: Normal  mood and affect.  Laboratory Data: Lab Results  Component Value Date   WBC 9.1 07/16/2020   HGB 14.0 07/16/2020   HCT 41.2 07/16/2020   MCV 91.8 07/16/2020   PLT 137 (L) 07/16/2020    Lab Results  Component Value Date   CREATININE 1.12 07/16/2020    No results found for: PSA  No results found for: TESTOSTERONE  Lab Results  Component Value Date   HGBA1C 11.5 (H) 09/02/2018    Urinalysis    Component Value Date/Time   COLORURINE YELLOW 07/16/2020 1740   APPEARANCEUR Clear 08/29/2020 1313   LABSPEC 1.018 07/16/2020 1740   PHURINE 5.0 07/16/2020 1740   GLUCOSEU Negative 08/29/2020 1313   HGBUR SMALL (A) 07/16/2020 1740   BILIRUBINUR Negative 08/29/2020 1313   KETONESUR NEGATIVE 07/16/2020 1740   PROTEINUR 3+ (A) 08/29/2020 1313   PROTEINUR >=300 (A) 07/16/2020 1740   UROBILINOGEN 0.2 07/01/2014 2244   NITRITE Negative 08/29/2020 1313   NITRITE NEGATIVE 07/16/2020 1740   LEUKOCYTESUR Negative 08/29/2020 1313   LEUKOCYTESUR NEGATIVE 07/16/2020 1740    Lab Results  Component Value Date   LABMICR See below: 08/29/2020   WBCUA None seen 08/29/2020   LABEPIT None seen 08/29/2020   MUCUS Present 08/29/2020   BACTERIA Few (A) 08/29/2020    Pertinent Imaging:  No results found for this or any previous visit.  No results found for this or any previous visit.  No results found for this or any previous visit.  No results found for this or any previous visit.  No results found for this or any previous visit.  No results found for this or any previous visit.  No results found for this or any previous visit.  No results found for this or any previous visit.   Assessment & Plan:    1. Prostate cancer [facility]) I discussed the natural history of low/intermediate/high risk prostate cancer with the patient and the various treatment options including active surveillance, RALP, IMRT, brachytherapy, cryotherapy, HIFU and ADT. After discussing the options the patient  elects for radiation therapy. We will obtain CT and bone scan prior to his appointment with Dr. Tammi Klippel - Basic metabolic panel - CT Abdomen Pelvis W Wo Contrast; Future - NM Bone Scan Whole Body; Future - Ambulatory referral to Radiation Oncology   No follow-ups on file.  Nicolette Bang, MD  [facility] [place]

## 2020-09-10 LAB — BASIC METABOLIC PANEL
BUN/Creatinine Ratio: 17 (ref 10–24)
BUN: 17 mg/dL (ref 8–27)
CO2: 20 mmol/L (ref 20–29)
Calcium: 9.5 mg/dL (ref 8.6–10.2)
Chloride: 105 mmol/L (ref 96–106)
Creatinine, Ser: 1 mg/dL (ref 0.76–1.27)
Glucose: 104 mg/dL — ABNORMAL HIGH (ref 65–99)
Potassium: 4.1 mmol/L (ref 3.5–5.2)
Sodium: 139 mmol/L (ref 134–144)
eGFR: 83 mL/min/{1.73_m2} (ref >59)

## 2020-09-23 DIAGNOSIS — E1165 Type 2 diabetes mellitus with hyperglycemia: Secondary | ICD-10-CM | POA: Diagnosis not present

## 2020-09-23 DIAGNOSIS — K7460 Unspecified cirrhosis of liver: Secondary | ICD-10-CM | POA: Diagnosis not present

## 2020-09-23 DIAGNOSIS — Z299 Encounter for prophylactic measures, unspecified: Secondary | ICD-10-CM | POA: Diagnosis not present

## 2020-09-23 DIAGNOSIS — F17210 Nicotine dependence, cigarettes, uncomplicated: Secondary | ICD-10-CM | POA: Diagnosis not present

## 2020-09-23 DIAGNOSIS — M869 Osteomyelitis, unspecified: Secondary | ICD-10-CM | POA: Diagnosis not present

## 2020-10-10 ENCOUNTER — Encounter (HOSPITAL_COMMUNITY)
Admission: RE | Admit: 2020-10-10 | Discharge: 2020-10-10 | Disposition: A | Discharge status: Home / Self Care | Payer: Medicare Other | Source: Ambulatory Visit | Attending: Urology | Admitting: Urology

## 2020-10-10 ENCOUNTER — Encounter (HOSPITAL_COMMUNITY): Payer: Self-pay

## 2020-10-10 ENCOUNTER — Other Ambulatory Visit: Payer: Self-pay

## 2020-10-10 ENCOUNTER — Ambulatory Visit (HOSPITAL_COMMUNITY)
Admission: RE | Admit: 2020-10-10 | Discharge: 2020-10-10 | Disposition: A | Discharge status: Home / Self Care | Payer: Medicare Other | Source: Ambulatory Visit | Attending: Urology | Admitting: Urology

## 2020-10-10 DIAGNOSIS — C61 Malignant neoplasm of prostate: Secondary | ICD-10-CM

## 2020-10-10 DIAGNOSIS — N3289 Other specified disorders of bladder: Secondary | ICD-10-CM | POA: Diagnosis not present

## 2020-10-10 DIAGNOSIS — K7689 Other specified diseases of liver: Secondary | ICD-10-CM | POA: Diagnosis not present

## 2020-10-10 DIAGNOSIS — N281 Cyst of kidney, acquired: Secondary | ICD-10-CM | POA: Diagnosis not present

## 2020-10-10 LAB — POCT I-STAT CREATININE: Creatinine, Ser: 0.9 mg/dL (ref 0.61–1.24)

## 2020-10-10 MED ORDER — TECHNETIUM TC 99M MEDRONATE IV KIT
20.0000 | PACK | Freq: Once | INTRAVENOUS | Status: AC | PRN
  Administered 2020-10-10: 19.9 via INTRAVENOUS

## 2020-10-10 MED ORDER — IOHEXOL 300 MG/ML  SOLN
100.0000 mL | Freq: Once | INTRAMUSCULAR | Status: AC | PRN
  Administered 2020-10-10: 100 mL via INTRAVENOUS

## 2020-10-14 DIAGNOSIS — Z0100 Encounter for examination of eyes and vision without abnormal findings: Secondary | ICD-10-CM | POA: Diagnosis not present

## 2020-10-14 DIAGNOSIS — E119 Type 2 diabetes mellitus without complications: Secondary | ICD-10-CM | POA: Diagnosis not present

## 2020-10-17 NOTE — Progress Notes (Signed)
Radiation Oncology         [phone] ________________________________  Initial Outpatient Consultation - Conducted via Telephone due to current COVID-19 concerns for limiting patient exposure  Name: Juan Juarez MRN: [id number]  Date: 10/18/2020  DOB: [birth year]  Juan Juarez, Juan Hatcher, MD  McKenzie, Candee Furbish, MD   REFERRING PHYSICIAN: Cleon Gustin, MD  DIAGNOSIS: 65 y.o. gentleman with Stage T1c adenocarcinoma of the prostate with Gleason score of 4+4, and PSA of 19.4.    ICD-10-CM   1. Malignant neoplasm of prostate ([place])  C61     HISTORY OF PRESENT ILLNESS: Juan Juarez is a 65 y.o. male with a diagnosis of prostate cancer. He was noted to have an elevated PSA of 19.4 by his primary care physician, Dr. Woody Seller in 06/2020.  Accordingly, he was referred for evaluation in urology by Dr. Alyson Ingles on 08/29/20,  digital rectal examination was performed at that time revealing no nodules.  The patient proceeded to transrectal ultrasound with 12 biopsies of the prostate on 09/01/31.  The prostate volume measured 36.4 cc. Out of 12 core biopsies, all 12 were positive.  The maximum Gleason score was 4+4, and this was seen in the left apex lateral. Additionally, Gleason 4+3 was seen in the left mid lateral (with perineural invasion), left base (with PNI), left mid (with PNI), left apex (with PNI), right base (with PNI), right apex (with PNI), right base lateral, right mid lateral, and right apex lateral. Gleason 3+4 was seen in the left base lateral (with PNI) and right mid (small focus).  He underwent staging CT and bone scan on 10/10/20. CT A/P showed no evidence of mass or lymphadenopathy and no convincing CT evidence of osseous metastatic disease. There was a sclerotic Schmorl-type superior endplate deformity of the L3 vertebral body, not favored as a isolated manifestation of osseous metastatic disease. Additionally, there was mild thickening of the urinary bladder wall, likely related to chronic  outlet obstruction and morphologic stigmata suggestive of hepatic cirrhosis and portal hypertension. Bone scan confirmed no evidence of osseous metastases.  The patient reviewed the biopsy results with his urologist and he has kindly been referred today for discussion of potential radiation treatment options.   PREVIOUS RADIATION THERAPY: No  PAST MEDICAL HISTORY:  Past Medical History:  Diagnosis Date  . Anxiety   . Chronic elbow pain, left   . Depression   . Diabetes mellitus, type II ([place])    type II  . Gout   . Head injury 12/2017  . Hepatitis C without hepatic coma   . Hypertension    denies  . Inflammatory arthropathy    left elbow  . Insomnia   . Non compliance w medication regimen   . Osteoarthritis   . Polysubstance abuse ([place])   . Prostate cancer ([place])   . Protein-calorie malnutrition, severe ([place]) 09/13/2016  . Seizure ([place]) 12/2017   after head Injury  . Thrombocytopenia ([place])       PAST SURGICAL HISTORY: Past Surgical History:  Procedure Laterality Date  . BURR HOLE Right 01/22/2018   Procedure: RIGHT BURR HOLES;  Surgeon: Newman Pies, MD;  Location: [facility];  Service: Neurosurgery;  Laterality: Right;  . I & D EXTREMITY Left 11/06/2018   Procedure: IRRIGATION AND DEBRIDEMENT LEFT ELBOW JOINT;  Surgeon: Charlotte Crumb, MD;  Location: [facility];  Service: Orthopedics;  Laterality: Left;  . INCISION AND DRAINAGE Left 10/24/2018   Procedure: INCISION AND DRAINAGE LEFT ELBOW WITH MASS EXCISION;  Surgeon: Charlotte Crumb,  MD;  Location: [facility];  Service: Orthopedics;  Laterality: Left;  Marland Kitchen MULTIPLE TOOTH EXTRACTIONS    . None      FAMILY HISTORY:  Family History  Problem Relation Age of Onset  . Thyroid disease Sister   . Colon cancer Neg Hx   . Colon polyps Neg Hx   . Breast cancer Neg Hx   . Prostate cancer Neg Hx   . Pancreatic cancer Neg Hx     SOCIAL HISTORY:  Social History   Socioeconomic History  . Marital status: Divorced    Spouse name:  Not on file  . Number of children: Not on file  . Years of education: Not on file  . Highest education level: Not on file  Occupational History  . Occupation: disabled  Tobacco Use  . Smoking status: Current Every Day Smoker    Packs/day: 0.50    Years: 50.00    Pack years: 25.00    Types: Cigarettes  . Smokeless tobacco: Never Used  Vaping Use  . Vaping Use: Never used  Substance and Sexual Activity  . Alcohol use: Not Currently    Comment: quit 2019  . Drug use: Not Currently    Types: Marijuana, Cocaine    Comment: cocaine last use 10/26/2018, Marijuana sping 2019  . Sexual activity: Not Currently  Other Topics Concern  . Not on file  Social History Narrative  . Not on file   Social Determinants of Health   Financial Resource Strain: Not on file  Food Insecurity: Not on file  Transportation Needs: Not on file  Physical Activity: Not on file  Stress: Not on file  Social Connections: Not on file  Intimate Partner Violence: Not on file    ALLERGIES: Patient has no known allergies.  MEDICATIONS:  Current Outpatient Medications  Medication Sig Dispense Refill  . ALPRAZolam (XANAX) 0.5 MG tablet Take 0.5 mg by mouth at bedtime as needed.    . blood glucose meter kit and supplies Dispense based on patient and insurance preference. Use up to four times daily as directed. (FOR ICD-10 E10.9, E11.9). 1 each 0  . cloBAZam (ONFI) 10 MG tablet clobazam 10 mg tablet  TAKE ONE TABLET BY MOUTH EVERY DAY    . doxepin (SINEQUAN) 75 MG capsule Take 75 mg by mouth.    Marland Kitchen EASY COMFORT INSULIN SYRINGE 31G X 5/16" 1 ML MISC USE AS DIRECTED ONCE DAILY FOR LEVEMIR FLEXTOUCH 100 DAY SUPPLY    . glipiZIDE (GLUCOTROL XL) 5 MG 24 hr tablet Take 5 mg by mouth daily.    Marland Kitchen LEVEMIR 100 UNIT/ML injection Inject 0.4 mLs (40 Units total) into the skin daily. 10 mL 0  . metFORMIN (GLUCOPHAGE) 500 MG tablet Take 500 mg by mouth 2 (two) times daily.    . sertraline (ZOLOFT) 100 MG tablet Take 100 mg by  mouth daily.    . simvastatin (ZOCOR) 10 MG tablet Take 10 mg by mouth daily.     No current facility-administered medications for this encounter.    REVIEW OF SYSTEMS:  On review of systems, the patient reports that he is doing well overall. He denies any chest pain, shortness of breath, cough, fevers, chills, night sweats, unintended weight changes. He denies any bowel disturbances, and denies abdominal pain, nausea or vomiting. He denies any new musculoskeletal or joint aches or pains. His IPSS was 1, indicating minimal urinary symptoms. His SHIM was 1, indicating he has severe erectile dysfunction. A complete review of systems  is obtained and is otherwise negative.    PHYSICAL EXAM:  Wt Readings from Last 3 Encounters:  10/18/20 203 lb (92.1 kg)  09/09/20 190 lb (86.2 kg)  08/29/20 190 lb (86.2 kg)   Temp Readings from Last 3 Encounters:  09/09/20 (!) 97.5 F (36.4 C)  08/31/20 97.8 F (36.6 C)  08/29/20 97.8 F (36.6 C)   BP Readings from Last 3 Encounters:  09/09/20 (!) 178/70  08/31/20 (!) 142/72  08/29/20 (!) 149/82   Pulse Readings from Last 3 Encounters:  09/09/20 60  08/31/20 64  08/29/20 67   Pain Assessment Pain Score: 0-No pain/10  Physical exam not performed in light of telephone consult visit format.  KPS = 90  100 - Normal; no complaints; no evidence of disease. 90   - Able to carry on normal activity; minor signs or symptoms of disease. 80   - Normal activity with effort; some signs or symptoms of disease. 57   - Cares for self; unable to carry on normal activity or to do active work. 60   - Requires occasional assistance, but is able to care for most of his personal needs. 50   - Requires considerable assistance and frequent medical care. 33   - Disabled; requires special care and assistance. 20   - Severely disabled; hospital admission is indicated although death not imminent. 12   - Very sick; hospital admission necessary; active supportive  treatment necessary. 10   - Moribund; fatal processes progressing rapidly. 0     - Dead  Karnofsky DA, [place] [address], Craver LS and Burchenal [facility] [phone]) The use of the nitrogen mustards in the palliative treatment of carcinoma: with particular reference to bronchogenic carcinoma Cancer 1 634-56  LABORATORY DATA:  Lab Results  Component Value Date   WBC 9.1 07/16/2020   HGB 14.0 07/16/2020   HCT 41.2 07/16/2020   MCV 91.8 07/16/2020   PLT 137 (L) 07/16/2020   Lab Results  Component Value Date   NA 139 09/09/2020   K 4.1 09/09/2020   CL 105 09/09/2020   CO2 20 09/09/2020   Lab Results  Component Value Date   ALT 11 07/16/2020   AST 20 07/16/2020   ALKPHOS 68 07/16/2020   BILITOT 0.9 07/16/2020     RADIOGRAPHY: CT Abdomen Pelvis W Wo Contrast  Result Date: 10/11/2020 CLINICAL DATA:  New diagnosis prostate cancer staging EXAM: CT ABDOMEN AND PELVIS WITHOUT AND WITH CONTRAST TECHNIQUE: Multidetector CT imaging of the abdomen and pelvis was performed following the standard protocol before and following the bolus administration of intravenous contrast. CONTRAST:  148mL OMNIPAQUE IOHEXOL 300 MG/ML  SOLN COMPARISON:  05/02/2017 FINDINGS: Lower chest: No acute abnormality. Aortic valve calcifications. Coronary artery calcifications. Hepatobiliary: No solid liver abnormality is seen. Coarse, nodular contour of the liver. No gallstones, gallbladder wall thickening, or biliary dilatation. Pancreas: Unremarkable. No pancreatic ductal dilatation or surrounding inflammatory changes. Spleen: Splenomegaly, maximum coronal span 15.2 cm. Adrenals/Urinary Tract: Adrenal glands are unremarkable. Benign exophytic cyst of the inferior pole of the left kidney. Kidneys are otherwise normal, without renal calculi, solid lesion, or hydronephrosis. Mild thickening of the urinary bladder wall. Stomach/Bowel: Stomach is within normal limits. Appendix appears normal. No evidence of bowel wall thickening, distention, or  inflammatory changes. Occasional sigmoid diverticula. Vascular/Lymphatic: Aortic atherosclerosis. Splenorenal varices. No enlarged abdominal or pelvic lymph nodes. Reproductive: No mass or other significant abnormality. Other: No abdominal wall hernia or abnormality. No abdominopelvic ascites. Musculoskeletal: Sclerotic Schmorl type superior endplate  deformity of the L3 vertebral body (series 5, image 77). IMPRESSION: 1. No evidence of mass or lymphadenopathy in the abdomen or pelvis. 2. No convincing CT evidence of osseous metastatic disease. There is a sclerotic Schmorl type superior endplate deformity of the L3 vertebral body, likely mechanical in nature, not favored as an isolated manifestation of osseous metastatic disease. Please see forthcoming nuclear bone scan. 3. Mild thickening of the urinary bladder wall, likely related to chronic outlet obstruction. 4. Morphologic stigmata suggestive of hepatic cirrhosis and portal hypertension including splenomegaly and splenorenal varices. 5. Coronary artery disease. Aortic Atherosclerosis (ICD10-I70.0). Electronically Signed   By: Eddie Candle M.D.   On: 10/11/2020 14:05   NM Bone Scan Whole Body  Result Date: 10/11/2020 CLINICAL DATA:  Prostate cancer staging EXAM: NUCLEAR MEDICINE WHOLE BODY BONE SCAN TECHNIQUE: Whole body anterior and posterior images were obtained approximately 3 hours after intravenous injection of radiopharmaceutical. RADIOPHARMACEUTICALS:  19.9 mCi Technetium-65m MDP IV COMPARISON:  10/10/2020 FINDINGS: Whole body planar imaging was obtained after radiotracer administration. There is physiologic excretion of radiotracer within the kidneys and bladder. Focal activity within the left anterior fourth rib costochondral junction likely reflects posttraumatic change. Mild degenerative type activity is seen symmetrically at the shoulders, elbows, wrists, knees, and feet. There is no radiotracer uptake to suggest bony metastases. Specifically, no  activity within the L3 vertebral body to correspond to the Schmorl's node on CT. IMPRESSION: 1. No evidence of bony metastases. 2. Likely posttraumatic change at the left anterior fourth costochondral junction. 3. Multifocal degenerative activity as above. Electronically Signed   By: Randa Ngo M.D.   On: 10/11/2020 22:03      IMPRESSION/PLAN: This visit was conducted via Telephone to spare the patient unnecessary potential exposure in the healthcare setting during the current COVID-19 pandemic. 1. 65 y.o. gentleman with Stage T1c adenocarcinoma of the prostate with Gleason Score of 4+4, and PSA of 19.4. We discussed the patient's workup and outlined the nature of prostate cancer in this setting. The patient's T stage, Gleason's score, and PSA put him into the high risk group. Accordingly, he is eligible for a variety of potential treatment options including prostatectomy or LT-ADT in combination with either 8 weeks of external radiation or brachytherapy boost followed by 5 weeks of external radiation. We discussed the available radiation techniques, and focused on the details and logistics of delivery. We discussed and outlined the risks, benefits, short and long-term effects associated with radiotherapy and compared and contrasted these with prostatectomy. We discussed the role of SpaceOAR in reducing the rectal toxicity associated with radiotherapy. We also detailed the role of ADT in the treatment of high risk prostate cancer and outlined the associated side effects that could be expected with this therapy.  He was encouraged to ask questions that were answered to his stated satisfaction.  At the end of the conversation, the patient is interested in moving forward with 8 weeks of external beam therapy in combination with LT-ADT. However, he resides in [place], and prefers to have daily treatments closer to his home. Therefore, we will make the referral to Radiation Oncology at [facility] for  consideration of recieving treatment there and we will share our discussion with Dr. Alyson Ingles so that he can make arrangements for start of ADT now.  The patient appears to have a good understanding of his disease and our treatment recommendations which are of curative intent and is in agreement with the stated plan.    Given current concerns for  patient exposure during the COVID-19 pandemic, this encounter was conducted via telephone. The patient was notified in advance and was offered a MyChart meeting to allow for face to face communication but unfortunately reported that he did not have the appropriate resources/technology to support such a visit and instead preferred to proceed with telephone consult. The patient has given verbal consent for this type of encounter. The time spent during this encounter was 60 minutes. The attendants for this meeting include Tyler Pita MD, Ashlyn Bruning PA-C, [place], and patient, Aadarsh Cozort. During the encounter, Tyler Pita MD, Ashlyn Bruning PA-C, and scribe, Wilburn Mylar were located at [facility].  Patient, Juan Juarez was located at home.    Nicholos Johns, PA-C    Tyler Pita, MD  [facility] Direct Dial: [phone]  Fax: [phone] [email or web address]  Skype  [email or web address]   This document serves as a record of services personally performed by Tyler Pita, MD and Freeman Caldron, PA-C. It was created on their behalf by Wilburn Mylar, a trained medical scribe. The creation of this record is based on the scribe's personal observations and the provider's statements to them. This document has been checked and approved by the attending provider.

## 2020-10-18 ENCOUNTER — Ambulatory Visit
Admission: RE | Admit: 2020-10-18 | Discharge: 2020-10-18 | Disposition: A | Discharge status: Home / Self Care | Payer: Medicare Other | Source: Ambulatory Visit | Attending: Radiation Oncology | Admitting: Radiation Oncology

## 2020-10-18 ENCOUNTER — Other Ambulatory Visit: Payer: Self-pay

## 2020-10-18 ENCOUNTER — Encounter: Payer: Self-pay | Admitting: Radiation Oncology

## 2020-10-18 DIAGNOSIS — C61 Malignant neoplasm of prostate: Secondary | ICD-10-CM

## 2020-10-18 HISTORY — DX: Malignant neoplasm of prostate: C61

## 2020-10-18 NOTE — Progress Notes (Addendum)
GU Location of Tumor / Histology: prostatic adenocarcinoma  If Prostate Cancer, Gleason Score is (4 + 4) and PSA is (19.4). Prostate volume: [place] presented for further evaluation of an elevated PSA.  Biopsies of prostate (if applicable) revealed:   Past/Anticipated interventions by urology, if any: prostate biopsy, CT abd/pelvis (negative), bone scan (negative), referral to Dr. Tammi Klippel to discuss radiation options, scheduled to follow up with Dr. Alyson Ingles tomorrow  Past/Anticipated interventions by medical oncology, if any: no  Weight changes, if any: no  Bowel/Bladder complaints, if any: IPSS 1. SHIM 1. Denies dysuria, hematuria, urinary leakage or incontinence. Denies any bowel complaints.   Nausea/Vomiting, if any: no  Pain issues, if any:  no  SAFETY ISSUES:  Prior radiation? no  Pacemaker/ICD? no  Possible current pregnancy? no  Is the patient on methotrexate? no  Current Complaints / other details:  65 year old male. Divorced. Disabled. Resides in [place].

## 2020-10-18 NOTE — Progress Notes (Signed)
Attempted to call. No answer. No way to leave message. Results mailed and patient also has office visit scheduled for 10/19/2020

## 2020-10-19 ENCOUNTER — Ambulatory Visit: Payer: Medicare Other | Admitting: Urology

## 2020-10-19 ENCOUNTER — Telehealth: Payer: Self-pay | Admitting: *Deleted

## 2020-10-19 NOTE — Telephone Encounter (Signed)
Called patient to update, that I have called Dr. Suzan Slick Office to arrange his appt. and that I have faxed his notes to Dr. Suzan Slick Office, unable to leave message due to vm not being set up

## 2020-10-24 ENCOUNTER — Telehealth: Payer: Self-pay | Admitting: Radiation Oncology

## 2020-10-24 NOTE — Telephone Encounter (Signed)
Rec'd a call from patient sister, [name], explaining that patient has seizures and doesn't remember things and was worried about a possible missed appointment from 4/19 with Dr. Tammi Klippel. I showed that appointment was kept, but delivered by telephone. The end result was that we referred the patient to Dr. Suzan Slick office at [facility] due to that location being closer to patient's home. [name] the phone number there and she was agreeable.

## 2020-10-25 DIAGNOSIS — K7460 Unspecified cirrhosis of liver: Secondary | ICD-10-CM | POA: Diagnosis not present

## 2020-10-25 DIAGNOSIS — Z79899 Other long term (current) drug therapy: Secondary | ICD-10-CM | POA: Diagnosis not present

## 2020-11-01 DIAGNOSIS — Z87820 Personal history of traumatic brain injury: Secondary | ICD-10-CM | POA: Diagnosis not present

## 2020-11-01 DIAGNOSIS — F32A Depression, unspecified: Secondary | ICD-10-CM | POA: Diagnosis not present

## 2020-11-01 DIAGNOSIS — M1990 Unspecified osteoarthritis, unspecified site: Secondary | ICD-10-CM | POA: Diagnosis not present

## 2020-11-01 DIAGNOSIS — Z79899 Other long term (current) drug therapy: Secondary | ICD-10-CM | POA: Diagnosis not present

## 2020-11-01 DIAGNOSIS — M109 Gout, unspecified: Secondary | ICD-10-CM | POA: Diagnosis not present

## 2020-11-01 DIAGNOSIS — Z7984 Long term (current) use of oral hypoglycemic drugs: Secondary | ICD-10-CM | POA: Diagnosis not present

## 2020-11-01 DIAGNOSIS — Z8619 Personal history of other infectious and parasitic diseases: Secondary | ICD-10-CM | POA: Diagnosis not present

## 2020-11-01 DIAGNOSIS — M549 Dorsalgia, unspecified: Secondary | ICD-10-CM | POA: Diagnosis not present

## 2020-11-01 DIAGNOSIS — Z794 Long term (current) use of insulin: Secondary | ICD-10-CM | POA: Diagnosis not present

## 2020-11-01 DIAGNOSIS — G8929 Other chronic pain: Secondary | ICD-10-CM | POA: Diagnosis not present

## 2020-11-01 DIAGNOSIS — F17210 Nicotine dependence, cigarettes, uncomplicated: Secondary | ICD-10-CM | POA: Diagnosis not present

## 2020-11-01 DIAGNOSIS — I10 Essential (primary) hypertension: Secondary | ICD-10-CM | POA: Diagnosis not present

## 2020-11-15 ENCOUNTER — Emergency Department (HOSPITAL_COMMUNITY)
Admission: EM | Admit: 2020-11-15 | Discharge: 2020-11-15 | Disposition: A | Discharge status: Home / Self Care | Attending: Emergency Medicine | Admitting: Emergency Medicine

## 2020-11-15 ENCOUNTER — Encounter (HOSPITAL_COMMUNITY): Payer: Self-pay | Admitting: *Deleted

## 2020-11-15 ENCOUNTER — Emergency Department (HOSPITAL_COMMUNITY)

## 2020-11-15 DIAGNOSIS — R059 Cough, unspecified: Secondary | ICD-10-CM | POA: Insufficient documentation

## 2020-11-15 DIAGNOSIS — Z20822 Contact with and (suspected) exposure to covid-19: Secondary | ICD-10-CM | POA: Insufficient documentation

## 2020-11-15 DIAGNOSIS — Z1339 Encounter for screening examination for other mental health and behavioral disorders: Secondary | ICD-10-CM | POA: Insufficient documentation

## 2020-11-15 DIAGNOSIS — I10 Essential (primary) hypertension: Secondary | ICD-10-CM | POA: Diagnosis not present

## 2020-11-15 DIAGNOSIS — Z79899 Other long term (current) drug therapy: Secondary | ICD-10-CM | POA: Insufficient documentation

## 2020-11-15 DIAGNOSIS — Z8546 Personal history of malignant neoplasm of prostate: Secondary | ICD-10-CM | POA: Insufficient documentation

## 2020-11-15 DIAGNOSIS — F17210 Nicotine dependence, cigarettes, uncomplicated: Secondary | ICD-10-CM | POA: Insufficient documentation

## 2020-11-15 DIAGNOSIS — Z7984 Long term (current) use of oral hypoglycemic drugs: Secondary | ICD-10-CM | POA: Insufficient documentation

## 2020-11-15 DIAGNOSIS — Z008 Encounter for other general examination: Secondary | ICD-10-CM

## 2020-11-15 DIAGNOSIS — R918 Other nonspecific abnormal finding of lung field: Secondary | ICD-10-CM | POA: Diagnosis not present

## 2020-11-15 DIAGNOSIS — E119 Type 2 diabetes mellitus without complications: Secondary | ICD-10-CM | POA: Insufficient documentation

## 2020-11-15 LAB — COMPREHENSIVE METABOLIC PANEL
ALT: 25 U/L (ref 0–44)
AST: 53 U/L — ABNORMAL HIGH (ref 15–41)
Albumin: 3.5 g/dL (ref 3.5–5.0)
Alkaline Phosphatase: 93 U/L (ref 38–126)
Anion gap: 7 (ref 5–15)
BUN: 31 mg/dL — ABNORMAL HIGH (ref 8–23)
CO2: 18 mmol/L — ABNORMAL LOW (ref 22–32)
Calcium: 9 mg/dL (ref 8.9–10.3)
Chloride: 104 mmol/L (ref 98–111)
Creatinine, Ser: 1.22 mg/dL (ref 0.61–1.24)
GFR, Estimated: >60 mL/min (ref >60)
Glucose, Bld: 143 mg/dL — ABNORMAL HIGH (ref 70–99)
Potassium: 4.6 mmol/L (ref 3.5–5.1)
Sodium: 129 mmol/L — ABNORMAL LOW (ref 135–145)
Total Bilirubin: 0.9 mg/dL (ref 0.3–1.2)
Total Protein: 8.1 g/dL (ref 6.5–8.1)

## 2020-11-15 LAB — CBC WITH DIFFERENTIAL/PLATELET
Abs Immature Granulocytes: 0.05 10*3/uL (ref 0.00–0.07)
Basophils Absolute: 0.1 10*3/uL (ref 0.0–0.1)
Basophils Relative: 0 %
Eosinophils Absolute: 0 10*3/uL (ref 0.0–0.5)
Eosinophils Relative: 0 %
HCT: 41.6 % (ref 39.0–52.0)
Hemoglobin: 14.3 g/dL (ref 13.0–17.0)
Immature Granulocytes: 0 %
Lymphocytes Relative: 10 %
Lymphs Abs: 1.3 10*3/uL (ref 0.7–4.0)
MCH: 31.6 pg (ref 26.0–34.0)
MCHC: 34.4 g/dL (ref 30.0–36.0)
MCV: 91.8 fL (ref 80.0–100.0)
Monocytes Absolute: 0.9 10*3/uL (ref 0.1–1.0)
Monocytes Relative: 7 %
Neutro Abs: 10.5 10*3/uL — ABNORMAL HIGH (ref 1.7–7.7)
Neutrophils Relative %: 83 %
Platelets: 110 10*3/uL — ABNORMAL LOW (ref 150–400)
RBC: 4.53 MIL/uL (ref 4.22–5.81)
RDW: 14.4 % (ref 11.5–15.5)
WBC: 12.9 10*3/uL — ABNORMAL HIGH (ref 4.0–10.5)
nRBC: 0 % (ref 0.0–0.2)

## 2020-11-15 LAB — SALICYLATE LEVEL: Salicylate Lvl: <7 mg/dL — ABNORMAL LOW (ref 7.0–30.0)

## 2020-11-15 LAB — ACETAMINOPHEN LEVEL: Acetaminophen (Tylenol), Serum: <10 ug/mL — ABNORMAL LOW (ref 10–30)

## 2020-11-15 LAB — ETHANOL: Alcohol, Ethyl (B): <10 mg/dL (ref <10)

## 2020-11-15 LAB — RESP PANEL BY RT-PCR (FLU A&B, COVID) ARPGX2
Influenza A by PCR: NEGATIVE
Influenza B by PCR: NEGATIVE
SARS Coronavirus 2 by RT PCR: NEGATIVE

## 2020-11-15 MED ORDER — FOLIC ACID 1 MG PO TABS
1.0000 mg | ORAL_TABLET | Freq: Once | ORAL | Status: AC
  Administered 2020-11-15: 1 mg via ORAL
  Filled 2020-11-15: qty 1

## 2020-11-15 MED ORDER — THIAMINE HCL 100 MG PO TABS
100.0000 mg | ORAL_TABLET | Freq: Once | ORAL | Status: AC
  Administered 2020-11-15: 100 mg via ORAL
  Filled 2020-11-15: qty 1

## 2020-11-15 NOTE — ED Triage Notes (Signed)
Patient escorted in by deputy for evaluation . History of alcohol abuse and needs clearance to go to a facility for help

## 2020-11-15 NOTE — ED Notes (Signed)
Pt keeps ripping off the leads while trying to get a EKG

## 2020-11-15 NOTE — ED Notes (Addendum)
Pt ambulated to restroom, told to get urine specimen. Pt put urine cup in toilet.  Will attempt to obtain urine sample at another time.
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
## 2020-11-15 NOTE — ED Notes (Signed)
ED Provider at bedside. 

## 2020-11-15 NOTE — Discharge Instructions (Addendum)
Juan Juarez was seen in the emergency department today for medical clearance so that he may be transferred to his next facility.  His physical exam and vital signs were reassuring as was his blood work.  He should increase his hydration over the next few days at home.  He has tested negative for COVID-19 and influenza A/B.  He is medically stable for transfer to his new facility.  Patient to be brought back to the emergency department if he develops any chest pain, difficulty breathing, nausea or vomiting or any other new severe symptoms.

## 2020-11-21 LAB — VITAMIN B1: Vitamin B1 (Thiamine): 182.2 nmol/L (ref 66.5–200.0)

## 2020-12-13 ENCOUNTER — Ambulatory Visit: Payer: Medicare Other | Admitting: Urology

## 2021-01-29 ENCOUNTER — Emergency Department (HOSPITAL_COMMUNITY): Payer: Medicare Other

## 2021-01-29 ENCOUNTER — Inpatient Hospital Stay (HOSPITAL_COMMUNITY)
Admission: EM | Admit: 2021-01-29 | Discharge: 2021-02-09 | DRG: 917 | Disposition: A | Discharge status: Other Acute Inpatient Hospital | Payer: Medicare Other | Attending: Internal Medicine | Admitting: Internal Medicine

## 2021-01-29 ENCOUNTER — Other Ambulatory Visit: Payer: Self-pay

## 2021-01-29 ENCOUNTER — Encounter (HOSPITAL_COMMUNITY): Payer: Self-pay

## 2021-01-29 DIAGNOSIS — T50902A Poisoning by unspecified drugs, medicaments and biological substances, intentional self-harm, initial encounter: Secondary | ICD-10-CM

## 2021-01-29 DIAGNOSIS — E871 Hypo-osmolality and hyponatremia: Secondary | ICD-10-CM | POA: Diagnosis present

## 2021-01-29 DIAGNOSIS — C61 Malignant neoplasm of prostate: Secondary | ICD-10-CM | POA: Diagnosis present

## 2021-01-29 DIAGNOSIS — Z79899 Other long term (current) drug therapy: Secondary | ICD-10-CM

## 2021-01-29 DIAGNOSIS — G928 Other toxic encephalopathy: Secondary | ICD-10-CM | POA: Diagnosis present

## 2021-01-29 DIAGNOSIS — N179 Acute kidney failure, unspecified: Secondary | ICD-10-CM | POA: Diagnosis present

## 2021-01-29 DIAGNOSIS — Z794 Long term (current) use of insulin: Secondary | ICD-10-CM

## 2021-01-29 DIAGNOSIS — Z20822 Contact with and (suspected) exposure to covid-19: Secondary | ICD-10-CM | POA: Diagnosis present

## 2021-01-29 DIAGNOSIS — E785 Hyperlipidemia, unspecified: Secondary | ICD-10-CM | POA: Diagnosis present

## 2021-01-29 DIAGNOSIS — M109 Gout, unspecified: Secondary | ICD-10-CM | POA: Diagnosis present

## 2021-01-29 DIAGNOSIS — J690 Pneumonitis due to inhalation of food and vomit: Secondary | ICD-10-CM | POA: Diagnosis present

## 2021-01-29 DIAGNOSIS — R509 Fever, unspecified: Secondary | ICD-10-CM

## 2021-01-29 DIAGNOSIS — R001 Bradycardia, unspecified: Secondary | ICD-10-CM

## 2021-01-29 DIAGNOSIS — I110 Hypertensive heart disease with heart failure: Secondary | ICD-10-CM | POA: Diagnosis present

## 2021-01-29 DIAGNOSIS — E440 Moderate protein-calorie malnutrition: Secondary | ICD-10-CM | POA: Diagnosis present

## 2021-01-29 DIAGNOSIS — F1910 Other psychoactive substance abuse, uncomplicated: Secondary | ICD-10-CM | POA: Diagnosis present

## 2021-01-29 DIAGNOSIS — T43222A Poisoning by selective serotonin reuptake inhibitors, intentional self-harm, initial encounter: Secondary | ICD-10-CM | POA: Diagnosis present

## 2021-01-29 DIAGNOSIS — E46 Unspecified protein-calorie malnutrition: Secondary | ICD-10-CM

## 2021-01-29 DIAGNOSIS — R4020 Unspecified coma: Secondary | ICD-10-CM | POA: Diagnosis not present

## 2021-01-29 DIAGNOSIS — T424X2A Poisoning by benzodiazepines, intentional self-harm, initial encounter: Secondary | ICD-10-CM | POA: Diagnosis not present

## 2021-01-29 DIAGNOSIS — G8929 Other chronic pain: Secondary | ICD-10-CM | POA: Diagnosis present

## 2021-01-29 DIAGNOSIS — Z781 Physical restraint status: Secondary | ICD-10-CM

## 2021-01-29 DIAGNOSIS — T383X2A Poisoning by insulin and oral hypoglycemic [antidiabetic] drugs, intentional self-harm, initial encounter: Secondary | ICD-10-CM | POA: Diagnosis present

## 2021-01-29 DIAGNOSIS — J9601 Acute respiratory failure with hypoxia: Secondary | ICD-10-CM | POA: Diagnosis present

## 2021-01-29 DIAGNOSIS — E8809 Other disorders of plasma-protein metabolism, not elsewhere classified: Secondary | ICD-10-CM | POA: Diagnosis present

## 2021-01-29 DIAGNOSIS — J9690 Respiratory failure, unspecified, unspecified whether with hypoxia or hypercapnia: Secondary | ICD-10-CM

## 2021-01-29 DIAGNOSIS — G40909 Epilepsy, unspecified, not intractable, without status epilepticus: Secondary | ICD-10-CM | POA: Diagnosis present

## 2021-01-29 DIAGNOSIS — F32A Depression, unspecified: Secondary | ICD-10-CM | POA: Diagnosis present

## 2021-01-29 DIAGNOSIS — Y9259 Other trade areas as the place of occurrence of the external cause: Secondary | ICD-10-CM

## 2021-01-29 DIAGNOSIS — Z9114 Patient's other noncompliance with medication regimen: Secondary | ICD-10-CM

## 2021-01-29 DIAGNOSIS — D649 Anemia, unspecified: Secondary | ICD-10-CM | POA: Diagnosis present

## 2021-01-29 DIAGNOSIS — I5031 Acute diastolic (congestive) heart failure: Secondary | ICD-10-CM | POA: Diagnosis present

## 2021-01-29 DIAGNOSIS — F29 Unspecified psychosis not due to a substance or known physiological condition: Secondary | ICD-10-CM | POA: Diagnosis not present

## 2021-01-29 DIAGNOSIS — R0902 Hypoxemia: Secondary | ICD-10-CM | POA: Diagnosis not present

## 2021-01-29 DIAGNOSIS — F419 Anxiety disorder, unspecified: Secondary | ICD-10-CM | POA: Diagnosis present

## 2021-01-29 DIAGNOSIS — I959 Hypotension, unspecified: Secondary | ICD-10-CM | POA: Diagnosis present

## 2021-01-29 DIAGNOSIS — Z7984 Long term (current) use of oral hypoglycemic drugs: Secondary | ICD-10-CM

## 2021-01-29 DIAGNOSIS — T68XXXA Hypothermia, initial encounter: Secondary | ICD-10-CM

## 2021-01-29 DIAGNOSIS — R45851 Suicidal ideations: Secondary | ICD-10-CM

## 2021-01-29 DIAGNOSIS — R404 Transient alteration of awareness: Secondary | ICD-10-CM | POA: Diagnosis not present

## 2021-01-29 DIAGNOSIS — F17210 Nicotine dependence, cigarettes, uncomplicated: Secondary | ICD-10-CM | POA: Diagnosis present

## 2021-01-29 DIAGNOSIS — F1410 Cocaine abuse, uncomplicated: Secondary | ICD-10-CM | POA: Diagnosis present

## 2021-01-29 DIAGNOSIS — R55 Syncope and collapse: Secondary | ICD-10-CM | POA: Diagnosis not present

## 2021-01-29 DIAGNOSIS — G4700 Insomnia, unspecified: Secondary | ICD-10-CM | POA: Diagnosis present

## 2021-01-29 DIAGNOSIS — Z28310 Unvaccinated for covid-19: Secondary | ICD-10-CM

## 2021-01-29 DIAGNOSIS — E1165 Type 2 diabetes mellitus with hyperglycemia: Secondary | ICD-10-CM | POA: Diagnosis present

## 2021-01-29 DIAGNOSIS — C799 Secondary malignant neoplasm of unspecified site: Secondary | ICD-10-CM | POA: Diagnosis present

## 2021-01-29 DIAGNOSIS — M25522 Pain in left elbow: Secondary | ICD-10-CM | POA: Diagnosis present

## 2021-01-29 DIAGNOSIS — D6959 Other secondary thrombocytopenia: Secondary | ICD-10-CM | POA: Diagnosis present

## 2021-01-29 DIAGNOSIS — B182 Chronic viral hepatitis C: Secondary | ICD-10-CM | POA: Diagnosis present

## 2021-01-29 DIAGNOSIS — G9340 Encephalopathy, unspecified: Secondary | ICD-10-CM

## 2021-01-29 DIAGNOSIS — R4182 Altered mental status, unspecified: Secondary | ICD-10-CM | POA: Diagnosis not present

## 2021-01-29 DIAGNOSIS — M79671 Pain in right foot: Secondary | ICD-10-CM

## 2021-01-29 MED ORDER — MIDAZOLAM HCL 2 MG/2ML IJ SOLN: 1.0000 mg | INTRAMUSCULAR | Status: DC | PRN

## 2021-01-29 MED ORDER — MIDAZOLAM HCL 2 MG/2ML IJ SOLN
1.0000 mg | INTRAMUSCULAR | Status: DC | PRN
Start: 2021-01-29 — End: 2021-01-30

## 2021-01-29 MED ORDER — ETOMIDATE 2 MG/ML IV SOLN
20.0000 mg | Freq: Once | INTRAVENOUS | Status: AC
  Administered 2021-01-29: 20 mg via INTRAVENOUS

## 2021-01-29 MED ORDER — SODIUM CHLORIDE 0.9 % IV BOLUS
4000.0000 mL | Freq: Once | INTRAVENOUS | Status: AC
  Administered 2021-01-30: 4000 mL via INTRAVENOUS

## 2021-01-29 MED ORDER — FENTANYL CITRATE (PF) 100 MCG/2ML IJ SOLN: 50.0000 ug | INTRAMUSCULAR | Status: DC | PRN

## 2021-01-29 MED ORDER — SUCCINYLCHOLINE CHLORIDE 20 MG/ML IJ SOLN
100.0000 mg | Freq: Once | INTRAMUSCULAR | Status: AC
  Administered 2021-01-29: 100 mg via INTRAVENOUS

## 2021-01-29 MED ORDER — FENTANYL CITRATE (PF) 100 MCG/2ML IJ SOLN
50.0000 ug | INTRAMUSCULAR | Status: DC | PRN
Start: 2021-01-29 — End: 2021-01-30

## 2021-01-29 NOTE — ED Triage Notes (Signed)
Pt found unresponsive in local motel room. Brought in emergency traffic via [facility]. Pt made earlier comments of wanting to kill himself. Pt found with multiple pill btls around him on scene. [facility] administered 2 mg Narcan without success. Pt had btl of Zoloft, Metformin, clonazapam and Zanax found around body on scene. Pt hypoxix and hypotensive upon arrival. EDP at bedside during Triage.

## 2021-01-30 ENCOUNTER — Inpatient Hospital Stay (HOSPITAL_COMMUNITY)
Admit: 2021-01-30 | Discharge: 2021-01-30 | Disposition: A | Discharge status: Home / Self Care | Payer: Medicare Other | Attending: Internal Medicine | Admitting: Internal Medicine

## 2021-01-30 ENCOUNTER — Inpatient Hospital Stay: Payer: Self-pay

## 2021-01-30 ENCOUNTER — Emergency Department (HOSPITAL_COMMUNITY): Payer: Medicare Other

## 2021-01-30 DIAGNOSIS — N179 Acute kidney failure, unspecified: Secondary | ICD-10-CM | POA: Diagnosis not present

## 2021-01-30 DIAGNOSIS — I509 Heart failure, unspecified: Secondary | ICD-10-CM | POA: Diagnosis not present

## 2021-01-30 DIAGNOSIS — R4182 Altered mental status, unspecified: Secondary | ICD-10-CM | POA: Diagnosis present

## 2021-01-30 DIAGNOSIS — F32A Depression, unspecified: Secondary | ICD-10-CM | POA: Diagnosis present

## 2021-01-30 DIAGNOSIS — Z515 Encounter for palliative care: Secondary | ICD-10-CM | POA: Diagnosis not present

## 2021-01-30 DIAGNOSIS — R001 Bradycardia, unspecified: Secondary | ICD-10-CM

## 2021-01-30 DIAGNOSIS — F05 Delirium due to known physiological condition: Secondary | ICD-10-CM | POA: Diagnosis not present

## 2021-01-30 DIAGNOSIS — R45851 Suicidal ideations: Secondary | ICD-10-CM

## 2021-01-30 DIAGNOSIS — J9601 Acute respiratory failure with hypoxia: Secondary | ICD-10-CM

## 2021-01-30 DIAGNOSIS — F1410 Cocaine abuse, uncomplicated: Secondary | ICD-10-CM | POA: Diagnosis present

## 2021-01-30 DIAGNOSIS — I5031 Acute diastolic (congestive) heart failure: Secondary | ICD-10-CM | POA: Diagnosis present

## 2021-01-30 DIAGNOSIS — D6959 Other secondary thrombocytopenia: Secondary | ICD-10-CM | POA: Diagnosis present

## 2021-01-30 DIAGNOSIS — J988 Other specified respiratory disorders: Secondary | ICD-10-CM | POA: Diagnosis not present

## 2021-01-30 DIAGNOSIS — E46 Unspecified protein-calorie malnutrition: Secondary | ICD-10-CM

## 2021-01-30 DIAGNOSIS — E871 Hypo-osmolality and hyponatremia: Secondary | ICD-10-CM | POA: Diagnosis present

## 2021-01-30 DIAGNOSIS — E1165 Type 2 diabetes mellitus with hyperglycemia: Secondary | ICD-10-CM

## 2021-01-30 DIAGNOSIS — F1910 Other psychoactive substance abuse, uncomplicated: Secondary | ICD-10-CM

## 2021-01-30 DIAGNOSIS — E785 Hyperlipidemia, unspecified: Secondary | ICD-10-CM | POA: Diagnosis present

## 2021-01-30 DIAGNOSIS — B182 Chronic viral hepatitis C: Secondary | ICD-10-CM | POA: Diagnosis present

## 2021-01-30 DIAGNOSIS — T68XXXA Hypothermia, initial encounter: Secondary | ICD-10-CM

## 2021-01-30 DIAGNOSIS — T68XXXD Hypothermia, subsequent encounter: Secondary | ICD-10-CM

## 2021-01-30 DIAGNOSIS — G40909 Epilepsy, unspecified, not intractable, without status epilepticus: Secondary | ICD-10-CM

## 2021-01-30 DIAGNOSIS — E782 Mixed hyperlipidemia: Secondary | ICD-10-CM

## 2021-01-30 DIAGNOSIS — G9340 Encephalopathy, unspecified: Secondary | ICD-10-CM

## 2021-01-30 DIAGNOSIS — F1994 Other psychoactive substance use, unspecified with psychoactive substance-induced mood disorder: Secondary | ICD-10-CM | POA: Diagnosis not present

## 2021-01-30 DIAGNOSIS — C61 Malignant neoplasm of prostate: Secondary | ICD-10-CM | POA: Diagnosis present

## 2021-01-30 DIAGNOSIS — F319 Bipolar disorder, unspecified: Secondary | ICD-10-CM | POA: Diagnosis not present

## 2021-01-30 DIAGNOSIS — E8809 Other disorders of plasma-protein metabolism, not elsewhere classified: Secondary | ICD-10-CM

## 2021-01-30 DIAGNOSIS — Z7189 Other specified counseling: Secondary | ICD-10-CM | POA: Diagnosis not present

## 2021-01-30 DIAGNOSIS — F332 Major depressive disorder, recurrent severe without psychotic features: Secondary | ICD-10-CM | POA: Diagnosis not present

## 2021-01-30 DIAGNOSIS — Z20822 Contact with and (suspected) exposure to covid-19: Secondary | ICD-10-CM | POA: Diagnosis present

## 2021-01-30 DIAGNOSIS — Y9259 Other trade areas as the place of occurrence of the external cause: Secondary | ICD-10-CM | POA: Diagnosis not present

## 2021-01-30 DIAGNOSIS — T43222A Poisoning by selective serotonin reuptake inhibitors, intentional self-harm, initial encounter: Secondary | ICD-10-CM | POA: Diagnosis present

## 2021-01-30 DIAGNOSIS — C799 Secondary malignant neoplasm of unspecified site: Secondary | ICD-10-CM | POA: Diagnosis present

## 2021-01-30 DIAGNOSIS — T50902A Poisoning by unspecified drugs, medicaments and biological substances, intentional self-harm, initial encounter: Secondary | ICD-10-CM | POA: Diagnosis not present

## 2021-01-30 DIAGNOSIS — T383X2A Poisoning by insulin and oral hypoglycemic [antidiabetic] drugs, intentional self-harm, initial encounter: Secondary | ICD-10-CM | POA: Diagnosis present

## 2021-01-30 DIAGNOSIS — G928 Other toxic encephalopathy: Secondary | ICD-10-CM | POA: Diagnosis present

## 2021-01-30 DIAGNOSIS — I110 Hypertensive heart disease with heart failure: Secondary | ICD-10-CM | POA: Diagnosis present

## 2021-01-30 DIAGNOSIS — E440 Moderate protein-calorie malnutrition: Secondary | ICD-10-CM | POA: Diagnosis present

## 2021-01-30 DIAGNOSIS — D649 Anemia, unspecified: Secondary | ICD-10-CM | POA: Diagnosis present

## 2021-01-30 DIAGNOSIS — T424X2A Poisoning by benzodiazepines, intentional self-harm, initial encounter: Secondary | ICD-10-CM | POA: Diagnosis not present

## 2021-01-30 DIAGNOSIS — J690 Pneumonitis due to inhalation of food and vomit: Secondary | ICD-10-CM | POA: Diagnosis present

## 2021-01-30 LAB — BLOOD GAS, ARTERIAL
Acid-base deficit: 4.4 mmol/L — ABNORMAL HIGH (ref 0.0–2.0)
Acid-base deficit: 7.5 mmol/L — ABNORMAL HIGH (ref 0.0–2.0)
Bicarbonate: 18.4 mmol/L — ABNORMAL LOW (ref 20.0–28.0)
Bicarbonate: 21 mmol/L (ref 20.0–28.0)
FIO2: 100
FIO2: 40
O2 Saturation: 97.9 %
O2 Saturation: 99.3 %
Patient temperature: 35
Patient temperature: 36.4
pCO2 arterial: 30.9 mmHg — ABNORMAL LOW (ref 32.0–48.0)
pCO2 arterial: 33.4 mmHg (ref 32.0–48.0)
pH, Arterial: 7.355 (ref 7.350–7.450)
pH, Arterial: 7.388 (ref 7.350–7.450)
pO2, Arterial: 103 mmHg (ref 83.0–108.0)
pO2, Arterial: 274 mmHg — ABNORMAL HIGH (ref 83.0–108.0)

## 2021-01-30 LAB — CREATININE, SERUM
Creatinine, Ser: 1.09 mg/dL (ref 0.61–1.24)
GFR, Estimated: >60 mL/min (ref >60)

## 2021-01-30 LAB — URINALYSIS, COMPLETE (UACMP) WITH MICROSCOPIC
Bilirubin Urine: NEGATIVE
Glucose, UA: NEGATIVE mg/dL
Ketones, ur: NEGATIVE mg/dL
Leukocytes,Ua: NEGATIVE
Nitrite: NEGATIVE
Protein, ur: 100 mg/dL — AB
Specific Gravity, Urine: 1.009 (ref 1.005–1.030)
pH: 6 (ref 5.0–8.0)

## 2021-01-30 LAB — RESP PANEL BY RT-PCR (FLU A&B, COVID) ARPGX2
Influenza A by PCR: NEGATIVE
Influenza B by PCR: NEGATIVE
SARS Coronavirus 2 by RT PCR: NEGATIVE

## 2021-01-30 LAB — CBC WITH DIFFERENTIAL/PLATELET
Abs Immature Granulocytes: 0.01 10*3/uL (ref 0.00–0.07)
Basophils Absolute: 0 10*3/uL (ref 0.0–0.1)
Basophils Relative: 0 %
Eosinophils Absolute: 0.1 10*3/uL (ref 0.0–0.5)
Eosinophils Relative: 2 %
HCT: 37.8 % — ABNORMAL LOW (ref 39.0–52.0)
Hemoglobin: 13 g/dL (ref 13.0–17.0)
Immature Granulocytes: 0 %
Lymphocytes Relative: 35 %
Lymphs Abs: 2 10*3/uL (ref 0.7–4.0)
MCH: 31.3 pg (ref 26.0–34.0)
MCHC: 34.4 g/dL (ref 30.0–36.0)
MCV: 91.1 fL (ref 80.0–100.0)
Monocytes Absolute: 0.5 10*3/uL (ref 0.1–1.0)
Monocytes Relative: 9 %
Neutro Abs: 3.1 10*3/uL (ref 1.7–7.7)
Neutrophils Relative %: 54 %
Platelets: 151 10*3/uL (ref 150–400)
RBC: 4.15 MIL/uL — ABNORMAL LOW (ref 4.22–5.81)
RDW: 13.6 % (ref 11.5–15.5)
WBC: 5.8 10*3/uL (ref 4.0–10.5)
nRBC: 0 % (ref 0.0–0.2)

## 2021-01-30 LAB — COMPREHENSIVE METABOLIC PANEL
ALT: 12 U/L (ref 0–44)
AST: 15 U/L (ref 15–41)
Albumin: 2.9 g/dL — ABNORMAL LOW (ref 3.5–5.0)
Alkaline Phosphatase: 77 U/L (ref 38–126)
Anion gap: 3 — ABNORMAL LOW (ref 5–15)
BUN: 28 mg/dL — ABNORMAL HIGH (ref 8–23)
CO2: 24 mmol/L (ref 22–32)
Calcium: 7.9 mg/dL — ABNORMAL LOW (ref 8.9–10.3)
Chloride: 107 mmol/L (ref 98–111)
Creatinine, Ser: 1.37 mg/dL — ABNORMAL HIGH (ref 0.61–1.24)
GFR, Estimated: 57 mL/min — ABNORMAL LOW (ref >60)
Glucose, Bld: 155 mg/dL — ABNORMAL HIGH (ref 70–99)
Potassium: 4.8 mmol/L (ref 3.5–5.1)
Sodium: 134 mmol/L — ABNORMAL LOW (ref 135–145)
Total Bilirubin: 0.5 mg/dL (ref 0.3–1.2)
Total Protein: 7.2 g/dL (ref 6.5–8.1)

## 2021-01-30 LAB — MAGNESIUM: Magnesium: 1.9 mg/dL (ref 1.7–2.4)

## 2021-01-30 LAB — CBC
HCT: 34.1 % — ABNORMAL LOW (ref 39.0–52.0)
Hemoglobin: 11.6 g/dL — ABNORMAL LOW (ref 13.0–17.0)
MCH: 31.7 pg (ref 26.0–34.0)
MCHC: 34 g/dL (ref 30.0–36.0)
MCV: 93.2 fL (ref 80.0–100.0)
Platelets: 127 10*3/uL — ABNORMAL LOW (ref 150–400)
RBC: 3.66 MIL/uL — ABNORMAL LOW (ref 4.22–5.81)
RDW: 13.7 % (ref 11.5–15.5)
WBC: 5.8 10*3/uL (ref 4.0–10.5)
nRBC: 0 % (ref 0.0–0.2)

## 2021-01-30 LAB — ACETAMINOPHEN LEVEL: Acetaminophen (Tylenol), Serum: <10 ug/mL — ABNORMAL LOW (ref 10–30)

## 2021-01-30 LAB — ETHANOL: Alcohol, Ethyl (B): <10 mg/dL (ref <10)

## 2021-01-30 LAB — RAPID URINE DRUG SCREEN, HOSP PERFORMED
Amphetamines: NOT DETECTED
Barbiturates: NOT DETECTED
Benzodiazepines: POSITIVE — AB
Cocaine: POSITIVE — AB
Opiates: NOT DETECTED
Tetrahydrocannabinol: NOT DETECTED

## 2021-01-30 LAB — HEMOGLOBIN A1C
Hgb A1c MFr Bld: 5.8 % — ABNORMAL HIGH (ref 4.8–5.6)
Mean Plasma Glucose: 119.76 mg/dL

## 2021-01-30 LAB — PHOSPHORUS: Phosphorus: 2.3 mg/dL — ABNORMAL LOW (ref 2.5–4.6)

## 2021-01-30 LAB — GLUCOSE, CAPILLARY
Glucose-Capillary: 120 mg/dL — ABNORMAL HIGH (ref 70–99)
Glucose-Capillary: 121 mg/dL — ABNORMAL HIGH (ref 70–99)
Glucose-Capillary: 152 mg/dL — ABNORMAL HIGH (ref 70–99)
Glucose-Capillary: 157 mg/dL — ABNORMAL HIGH (ref 70–99)
Glucose-Capillary: 172 mg/dL — ABNORMAL HIGH (ref 70–99)

## 2021-01-30 LAB — FOLATE: Folate: 11.7 ng/mL (ref >5.9)

## 2021-01-30 LAB — HIV ANTIBODY (ROUTINE TESTING W REFLEX): HIV Screen 4th Generation wRfx: NONREACTIVE

## 2021-01-30 LAB — PROCALCITONIN: Procalcitonin: <0.1 ng/mL

## 2021-01-30 LAB — SALICYLATE LEVEL: Salicylate Lvl: <7 mg/dL — ABNORMAL LOW (ref 7.0–30.0)

## 2021-01-30 LAB — TSH: TSH: 1.741 u[IU]/mL (ref 0.350–4.500)

## 2021-01-30 LAB — VITAMIN B12: Vitamin B-12: 605 pg/mL (ref 180–914)

## 2021-01-30 LAB — LACTIC ACID, PLASMA: Lactic Acid, Venous: 1.4 mmol/L (ref 0.5–1.9)

## 2021-01-30 LAB — AMMONIA: Ammonia: 26 umol/L (ref 9–35)

## 2021-01-30 LAB — CK: Total CK: 58 U/L (ref 49–397)

## 2021-01-30 LAB — MRSA NEXT GEN BY PCR, NASAL: MRSA by PCR Next Gen: NOT DETECTED

## 2021-01-30 LAB — T4, FREE: Free T4: 0.73 ng/dL (ref 0.61–1.12)

## 2021-01-30 MED ORDER — ORAL CARE MOUTH RINSE
15.0000 mL | OROMUCOSAL | Status: DC
  Administered 2021-01-30 (×3): 15 mL via OROMUCOSAL

## 2021-01-30 MED ORDER — PROSOURCE TF PO LIQD
45.0000 mL | Freq: Two times a day (BID) | ORAL | Status: DC
  Administered 2021-01-30 – 2021-01-31 (×3): 45 mL
  Filled 2021-01-30 (×3): qty 45

## 2021-01-30 MED ORDER — MIDAZOLAM HCL 2 MG/2ML IJ SOLN
1.0000 mg | INTRAMUSCULAR | Status: DC | PRN
  Administered 2021-01-31 (×2): 1 mg via INTRAVENOUS
  Filled 2021-01-30 (×2): qty 2

## 2021-01-30 MED ORDER — CHLORHEXIDINE GLUCONATE 0.12% ORAL RINSE (MEDLINE KIT)
15.0000 mL | Freq: Two times a day (BID) | OROMUCOSAL | Status: DC
  Administered 2021-01-30: 15 mL via OROMUCOSAL

## 2021-01-30 MED ORDER — PANTOPRAZOLE SODIUM 40 MG IV SOLR
40.0000 mg | INTRAVENOUS | Status: DC
  Administered 2021-01-30: 40 mg via INTRAVENOUS

## 2021-01-30 MED ORDER — FENTANYL CITRATE (PF) 100 MCG/2ML IJ SOLN
25.0000 ug | INTRAMUSCULAR | Status: DC | PRN
  Administered 2021-01-31 (×2): 100 ug via INTRAVENOUS
  Filled 2021-01-30 (×2): qty 2

## 2021-01-30 MED ORDER — VITAL AF 1.2 CAL PO LIQD
1000.0000 mL | ORAL | Status: DC
  Administered 2021-01-30: 1000 mL

## 2021-01-30 MED ORDER — SODIUM BICARBONATE 8.4 % IV SOLN
50.0000 meq | Freq: Once | INTRAVENOUS | Status: AC
  Administered 2021-01-30: 50 meq via INTRAVENOUS

## 2021-01-30 MED ORDER — CHLORHEXIDINE GLUCONATE 0.12% ORAL RINSE (MEDLINE KIT)
15.0000 mL | Freq: Two times a day (BID) | OROMUCOSAL | Status: DC
  Administered 2021-01-30 – 2021-01-31 (×2): 15 mL via OROMUCOSAL

## 2021-01-30 MED ORDER — CHLORHEXIDINE GLUCONATE CLOTH 2 % EX PADS
6.0000 | MEDICATED_PAD | Freq: Every day | CUTANEOUS | Status: DC
  Administered 2021-01-30 – 2021-02-09 (×9): 6 via TOPICAL

## 2021-01-30 MED ORDER — PANTOPRAZOLE SODIUM 40 MG PO PACK
40.0000 mg | PACK | Freq: Every day | ORAL | Status: DC
  Administered 2021-01-30 – 2021-01-31 (×2): 40 mg
  Filled 2021-01-30 (×2): qty 20

## 2021-01-30 MED ORDER — ENOXAPARIN SODIUM 40 MG/0.4ML IJ SOSY
40.0000 mg | PREFILLED_SYRINGE | INTRAMUSCULAR | Status: DC
  Administered 2021-01-30 – 2021-02-09 (×11): 40 mg via SUBCUTANEOUS
  Filled 2021-01-30 (×12): qty 0.4

## 2021-01-30 MED ORDER — ORAL CARE MOUTH RINSE
15.0000 mL | OROMUCOSAL | Status: DC
  Administered 2021-01-30 – 2021-01-31 (×8): 15 mL via OROMUCOSAL

## 2021-01-30 MED ORDER — DOCUSATE SODIUM 50 MG/5ML PO LIQD
100.0000 mg | Freq: Every day | ORAL | Status: DC | PRN
  Filled 2021-01-30: qty 10

## 2021-01-30 MED ORDER — POLYETHYLENE GLYCOL 3350 17 G PO PACK: 17.0000 g | PACK | Freq: Every day | ORAL | Status: DC | PRN

## 2021-01-30 MED ORDER — LACTATED RINGERS IV BOLUS
1000.0000 mL | Freq: Once | INTRAVENOUS | Status: AC
  Administered 2021-01-30: 1000 mL via INTRAVENOUS

## 2021-01-30 MED ORDER — SODIUM CHLORIDE 0.9 % IV BOLUS
500.0000 mL | Freq: Once | INTRAVENOUS | Status: AC
  Administered 2021-01-30: 500 mL via INTRAVENOUS

## 2021-01-30 NOTE — H&P (Signed)
History and Physical  Juan Juarez ZOX:[id number] DOB: [birth year] DOA: 01/29/2021  Referring physician: Ripley Fraise, MD PCP: Juan Chroman, MD  Patient coming from: Home  Chief Complaint: Altered mental status  HPI: Juan Juarez is a 65 y.o. male with medical history significant for T2DM, hypertension, polysubstance abuse, hepatitis C, hyperlipidemia, anxiety who presents to the emergency department via EMS due to altered mental status.  Patient was unable to provide history due to being intubated and sedated.  History was obtained from ED physician and ED medical record.  Per report, patient was found unresponsive due to intentional overdose in a motel.  Empty bottles of prescribed medications including Zoloft, metformin, Xanax, clonazepam, gabapentin and doxepin were seen.  Apparently, patient has threatened to harm himself prior to the overdose.  2 mg of Narcan was administered by EMS team without improvement and he was taken to the ED for further evaluation and management.  Patient was reported to be hypoxic and hypotensive on arrival to the ED.  ED Course:  In the emergency department, he was hypothermic with a temperature of 66F, bradycardic, BP was around 10/72.  Patient was intubated and sedated.  Work-up in the ED showed normocytic anemia,, hyperglycemia, BUN/Cr creatinine 20/1.37 (baseline creatinine at 0.9-1.2), albumin 2.9.  Lactic acid 1.4, acetaminophen, ethanol and salicylate levels with negative, ammonia 26, urine drug screen was positive for cocaine and benzodiazepine.  Influenza A, B, SARS coronavirus 2 was negative. CT of head without contrast showed no acute intracranial abnormality Chest x-ray showed ET tube tip 3 cm from the carina, enteric tube tip below the diaphragm in the stomach.  Advancement of 3-4 cm recommended. IV hydration was provided, bicarbonate was given, as needed fentanyl and Versed were ordered.  PCCM at [facility] was consulted and recommended that patient  can be admitted here at [facility].  Hospitalist was asked to admit patient for further evaluation and management.  Review of Systems: This cannot be obtained at this time due to patient being intubated and sedated  Past Medical History:  Diagnosis Date   Anxiety    Chronic elbow pain, left    Depression    Diabetes mellitus, type II ([place])    type II   Gout    Head injury 12/2017   Hepatitis C without hepatic coma    Hypertension    denies   Inflammatory arthropathy    left elbow   Insomnia    Non compliance w medication regimen    Osteoarthritis    Polysubstance abuse ([place])    Prostate cancer ([place])    Protein-calorie malnutrition, severe ([place]) 09/13/2016   Seizure ([place]) 12/2017   after head Injury   Thrombocytopenia [facility])    Past Surgical History:  Procedure Laterality Date   BURR HOLE Right 01/22/2018   Procedure: RIGHT BURR HOLES;  Surgeon: Juan Pies, MD;  Location: [place];  Service: Neurosurgery;  Laterality: Right;   I & D EXTREMITY Left 11/06/2018   Procedure: IRRIGATION AND DEBRIDEMENT LEFT ELBOW JOINT;  Surgeon: Juan Crumb, MD;  Location: [place];  Service: Orthopedics;  Laterality: Left;   INCISION AND DRAINAGE Left 10/24/2018   Procedure: INCISION AND DRAINAGE LEFT ELBOW WITH MASS EXCISION;  Surgeon: Juan Crumb, MD;  Location: [place];  Service: Orthopedics;  Laterality: Left;   MULTIPLE TOOTH EXTRACTIONS     None      Social History:  reports that he has been smoking cigarettes. He has a 25.00 pack-year smoking history. He has never used  smokeless tobacco. He reports previous alcohol use. He reports previous drug use. Drugs: Marijuana and Cocaine.   No Known Allergies  Family History  Problem Relation Age of Onset   Thyroid disease Sister    Colon cancer Neg Hx    Colon polyps Neg Hx    Breast cancer Neg Hx    Prostate cancer Neg Hx    Pancreatic cancer Neg Hx      Prior to Admission medications   Medication Sig Start Date End Date Taking?  Authorizing Provider  ALPRAZolam Juan Juarez) 0.5 MG tablet Take 0.5 mg by mouth at bedtime as needed. 06/10/19   [provider]  blood glucose meter kit and supplies Dispense based on patient and insurance preference. Use up to four times daily as directed. (FOR ICD-10 E10.9, E11.9). 09/03/18   Johnson, Clanford L, MD  cloBAZam (ONFI) 10 MG tablet clobazam 10 mg tablet  TAKE ONE TABLET BY MOUTH EVERY DAY    [provider]  doxepin (SINEQUAN) 75 MG capsule Take 75 mg by mouth.    [provider]  EASY COMFORT INSULIN SYRINGE 31G X 5/16" 1 ML MISC USE AS DIRECTED ONCE DAILY FOR LEVEMIR [place] 100 DAY SUPPLY 08/15/20   [provider]  gabapentin (NEURONTIN) 300 MG capsule Take by mouth. 04/01/19   [provider]  glipiZIDE (GLUCOTROL XL) 5 MG 24 hr tablet Take 5 mg by mouth daily. 06/10/19   [provider]  LEVEMIR 100 UNIT/ML injection Inject 0.4 mLs (40 Units total) into the skin daily. 09/03/18   Johnson, Clanford L, MD  metFORMIN (GLUCOPHAGE) 500 MG tablet Take 500 mg by mouth 2 (two) times daily. 10/02/18   [provider]  oxyCODONE-acetaminophen (PERCOCET/ROXICET) 5-325 MG tablet Take 1 tablet by mouth every 4 (four) hours as needed.    [provider]  sertraline (ZOLOFT) 100 MG tablet Take 100 mg by mouth daily. 10/08/19   [provider]  simvastatin (ZOCOR) 10 MG tablet Take 10 mg by mouth daily. 06/10/19   [provider]    Physical Exam: BP (!) 82/54   Pulse (!) 50   Temp (!) 95.6 F (35.3 C) (Rectal)   Resp 16   Ht _0  (1.702 m)   Wt 86.2 kg   SpO2 100%   BMI 29.76 kg/m   General: 65 y.o. year-old male well developed intubated and sedated and in no acute distress.  HEENT: NCAT, PERRL Neck: Supple, trachea medial Cardiovascular: Bradycardia.  Regular rate and rhythm with no rubs or gallops.   2/4 pulses in all 4 extremities. Respiratory: Bilateral coarse breath sounds on  auscultation Abdomen: Soft, nontender nondistended with normal bowel sounds x4 quadrants. Muskuloskeletal: No cyanosis, clubbing or edema noted bilaterally Neuro: This cannot be obtained at this time due to patient being intubated and sedated Skin: Cool to touch.  No ulcerative lesions noted or rashes Psychiatry: This cannot be obtained at this time due to patient being intubated and sedated         Labs on Admission:  Basic Metabolic Panel: Recent Labs  Lab 01/29/21 2336 01/30/21 0432  NA 134*  --   K 4.8  --   CL 107  --   CO2 24  --   GLUCOSE 155*  --   BUN 28*  --   CREATININE 1.37* 1.09  CALCIUM 7.9*  --   MG  --  1.9  PHOS  --  2.3*   Liver Function Tests: Recent Labs  Lab 01/29/21 2336  AST 15  ALT 12  ALKPHOS 77  BILITOT 0.5  PROT 7.2  ALBUMIN 2.9*   No results for input(s): LIPASE, AMYLASE in the last 168 hours. Recent Labs  Lab 01/30/21 0117  AMMONIA 26   CBC: Recent Labs  Lab 01/29/21 2336 01/30/21 0432  WBC 5.8 5.8  NEUTROABS 3.1  --   HGB 13.0 11.6*  HCT 37.8* 34.1*  MCV 91.1 93.2  PLT 151 127*   Cardiac Enzymes: No results for input(s): CKTOTAL, CKMB, CKMBINDEX, TROPONINI in the last 168 hours.  BNP (last 3 results) No results for input(s): BNP in the last 8760 hours.  ProBNP (last 3 results) No results for input(s): PROBNP in the last 8760 hours.  CBG: No results for input(s): GLUCAP in the last 168 hours.  Radiological Exams on Admission: CT Head Wo Contrast  Result Date: 01/30/2021 CLINICAL DATA:  Mental status change, unknown cause. Patient had informed family that he wanted to kill himself. Patient was found in a local motel unresponsive. AMS. EXAM: CT HEAD WITHOUT CONTRAST TECHNIQUE: Contiguous axial images were obtained from the base of the skull through the vertex without intravenous contrast. COMPARISON:  07/16/2020 FINDINGS: Brain: Moderate parenchymal volume loss, asymmetrically more severe involving the right temporal lobe,  appears similar to prior examination. Borderline ventriculomegaly, asymmetrically more severe involving the right lateral ventricle likely represents the sequela of central atrophy and resultant ex vacuo dilatation. Interval development of a remote lacunar infarct within the left thalamus. Known pontine infarct is not well appreciated on this examination. No evidence of acute intracranial hemorrhage or infarct. No abnormal mass effect or midline shift. No abnormal intra or extra-axial mass lesion or fluid collection. Cerebellum is unremarkable. Vascular: No asymmetric hyperdense vasculature at the skull base peer Skull: Burr holes within the right frontal and parietal regions are again noted. No acute calvarial fracture. Sinuses/Orbits: The orbits and paranasal sinuses are unremarkable. Other: Mastoid air cells and middle ear cavities are clear. Rounded except ule subcutaneous mass is partially visualized, similar to that noted on prior MRI examination. IMPRESSION: No acute intracranial abnormality. Relatively advanced senescent changes, asymmetrically more severe involving the right temporal lobe, possibly related to a remote trauma. This appears stable since prior examination. 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 Electronically Signed   By: Fidela Salisbury MD   On: 01/30/2021 02:34   DG Chest Port 1 View  Result Date: 01/30/2021 CLINICAL DATA:  Altered mental status. Found unresponsive post intubation. EXAM: PORTABLE CHEST 1 VIEW COMPARISON:  Radiograph 11/15/2020 FINDINGS: Endotracheal tube tip is 3 cm from the carina. Enteric tube is in place. Tip is below the diaphragm in the stomach, side-port in the region of the gastroesophageal junction. Lung volumes are low. Stable heart size and mediastinal contours. Streaky bibasilar atelectasis without confluent airspace disease. No convincing pulmonary edema. No pneumothorax or large pleural effusion. No acute osseous abnormalities  are seen. IMPRESSION: 1. Endotracheal tube tip 3 cm from the carina. 2. Enteric tube tip below the diaphragm in the stomach, side-port in the region of the gastroesophageal junction. Recommend advancement of 3-4 cm. 3. Low lung volumes with streaky bibasilar atelectasis. Electronically Signed   By: Keith Rake M.D.   On: 01/30/2021 00:14    EKG: I independently viewed the EKG done and my findings are as followed: Sinus bradycardia at rate of 59 bpm  Assessment/Plan Present on Admission:  Acute respiratory failure with hypoxia ([place])  Polysubstance abuse ([place])  Principal Problem:   Acute respiratory failure with hypoxia (HCC) Active Problems:   Hyperglycemia due to diabetes mellitus (HCC)   Polysubstance abuse (HCC)   Hypoalbuminemia due to protein-calorie malnutrition (HCC)   Intentional overdose of drug in tablet form ([place])   Hypothermia   Bradycardia   Hyperlipidemia   Suicidal ideation  Acute respiratory failure with hypoxia secondary to intentional drug overdose Reported suicidal ideation Patient was reported to have threatened to harm himself and subsequently was seen in a motel unresponsive and with several empty bottles of prescription pills around him. Patient was intubated and sedated with as needed fentanyl and Versed ABG will be obtained Continue IV Protonix stop prevent stress induced ulcer Continue IV hydration Continue to monitor BP with goal MAP of 65 Continue daily monitoring and plan to wean patient off mechanical ventilation as tolerated Pulmonary critical care was already consulted by ED physician and will set up patient can stay here at [facility].  PCCM was consulted to follow with patient while here at [facility], we shall await further recommendation Consider psychiatry consult when patient is more stable  Hypothermia Patient's temperature was 93.66F on arrival to the ED Bair hugger was provided with current temperature 35.6  Bradycardia EKG personally reviewed shows  sinus bradycardia at rate of 59 bpm HR currently in the 50s; continue telemetry  Polysubstance abuse Urine drug screen was positive for cocaine and benzodiazepine (patient has prescription for Xanax) Avoid beta-blockers Monitor for benzodiazepine withdrawal seizures and treat accordingly Continue fall precaution and neurochecks  Hypoalbuminemia secondary to moderate protein calorie malnutrition Consider protein supplementation when patient is extubated  Hyperglycemia secondary to T2DM Continue ISS and hypoglycemic protocol  Hyperlipidemia Continue home statin when patient is extubated and able to tolerate oral intake   DVT prophylaxis: Lovenox  Code Status: Full code  Family Communication: None at bedside  Disposition Plan:  Patient is from:                        home Anticipated DC to:                   SNF or family members home Anticipated DC date:               2-3 days Anticipated DC barriers:          Patient requires inpatient management due to being intubated and sedated and on mechanical ventilation secondary to intentional drug overdose  Consults called: Pulmonary critical care  Admission status: Inpatient    Bernadette Hoit MD [facility]  01/30/2021, 7:30 AM

## 2021-01-30 NOTE — Consult Note (Signed)
[facility]   Patient name: Juan Juarez Admit date: 01/29/2021  DOB: Apr 26, 1956 LOS: 0  MRN: [id number] Consult date: 01/31/2021  Referring provider: Dr. Carles Collet, [facility] CC: Suicide attempt    History:  65 yo male smoker found unresponsive in motel room surrounded by pill bottles after intentional overdose.  Empty bottles of zoloft, metformin, xanax, klonopin, gabapentin, and doxepin.  Noted to have low temperature, oxygen and blood pressure in ER.  Required intubation for airway protection.  UDS positive for cocaine and benzo's.  Hx from chart, medical team, and pt's daughter.  Past medical history:  DM type 2, HTN, Hep C, HLD, Anxiety with depression, Gout, Insomnia, Polysubstance abuse, Prostate cancer, Seizure after head injury  Significant events:  8/01 admit  Studies:  CT head 8/01 >> volume loss, remote Lt thalamic infarct  Micro:    Lines:  ETT 7/31 >>    Antibiotics:    Consults:      Interim history:    Vital signs:  BP (!) 98/55   Pulse (!) 56   Temp 99.6 F (37.6 C)   Resp 17   Ht $R'5\' 7"'cp$  (1.702 m)   Wt 86.2 kg   SpO2 100%   BMI 29.76 kg/m   Intake/output:  I/O last 3 completed shifts: In: 7000 [I.V.:2000; IV Piggyback:5000] Out: 700 [Urine:700]   Physical exam:   General - on vent Eyes - pupils reactive ENT - ETT in place Cardiac - regular rate/rhythm, no murmur Chest - equal breath sounds b/l, no wheezing or rales Abdomen - soft, non tender, + bowel sounds Extremities - no cyanosis, clubbing, or edema Skin - no rashes Neuro - tries to grab at ETT, moves extremities with brisk stimulation, not following commands  Best practice:   DVT - lovenox SUP - protonix Nutrition - tube feeds Mobility - bed rest   Assessment/plan:   Compromised airway with acute hypoxic respiratory failure in setting of intentional overdose. - continue ventilatory support until mental status improved - f/u  CXR - goal SpO2 > 89%  Acute metabolic encephalopathy in setting of hypoxia, hypotension, hypothermia from intentional overdose. Hx of polysubstance abuse, anxiety with depression, insomnia. - RASS Goal 0 - monitor mental status - f/u EEG - if no improvement in next 24 to 48 hrs, then would need further neuro assessment - will need psychiatry assessment when mental status improves  DM type 2. Hx of HTN. Hx of prostate cancer. - per primary team  Resolved hospital problems:    Goals of care/Family discussions:  Code status: full code  Updated pt's daughter at bedside.  Labs:   CMP Latest Ref Rng & Units 01/30/2021 01/29/2021 11/15/2020  Glucose 70 - 99 mg/dL - 155(H) 143(H)  BUN 8 - 23 mg/dL - 28(H) 31(H)  Creatinine 0.61 - 1.24 mg/dL 1.09 1.37(H) 1.22  Sodium 135 - 145 mmol/L - 134(L) 129(L)  Potassium 3.5 - 5.1 mmol/L - 4.8 4.6  Chloride 98 - 111 mmol/L - 107 104  CO2 22 - 32 mmol/L - 24 18(L)  Calcium 8.9 - 10.3 mg/dL - 7.9(L) 9.0  Total Protein 6.5 - 8.1 g/dL - 7.2 8.1  Total Bilirubin 0.3 - 1.2 mg/dL - 0.5 0.9  Alkaline Phos 38 - 126 U/L - 77 93  AST 15 - 41 U/L - 15 53(H)  ALT 0 - 44 U/L - 12 25    CBC Latest Ref Rng & Units 01/30/2021 01/29/2021 11/15/2020  WBC 4.0 - 10.5 K/uL  5.8 5.8 12.9(H)  Hemoglobin 13.0 - 17.0 g/dL 11.6(L) 13.0 14.3  Hematocrit 39.0 - 52.0 % 34.1(L) 37.8(L) 41.6  Platelets 150 - 400 K/uL 127(L) 151 110(L)    ABG    Component Value Date/Time   PHART 7.388 01/30/2021 0807   PCO2ART 33.4 01/30/2021 0807   PO2ART 103 01/30/2021 0807   HCO3 21.0 01/30/2021 0807   TCO2 22 01/28/2018 0303   ACIDBASEDEF 4.4 (H) 01/30/2021 0807   O2SAT 97.9 01/30/2021 0807    CBG (last 3)  Recent Labs    01/30/21 0807 01/30/21 1217  GLUCAP 120* 121*     Past surgical history:  He  has a past surgical history that includes None; Multiple tooth extractions; Georganna Skeans (Right, 01/22/2018); Incision and drainage (Left, 10/24/2018); and I & D extremity (Left,  11/06/2018).  Social history:  He  reports that he has been smoking cigarettes. He has a 25.00 pack-year smoking history. He has never used smokeless tobacco. He reports previous alcohol use. He reports previous drug use. Drugs: Marijuana and Cocaine.   Review of systems:  Unable to obtain  Family history:  His family history includes Thyroid disease in his sister.    Medications:   No current facility-administered medications on file prior to encounter.   Current Outpatient Medications on File Prior to Encounter  Medication Sig   ALPRAZolam (XANAX) 0.5 MG tablet Take 0.5 mg by mouth at bedtime as needed.   blood glucose meter kit and supplies Dispense based on patient and insurance preference. Use up to four times daily as directed. (FOR ICD-10 E10.9, E11.9).   cloBAZam (ONFI) 10 MG tablet clobazam 10 mg tablet  TAKE ONE TABLET BY MOUTH EVERY DAY   doxepin (SINEQUAN) 75 MG capsule Take 75 mg by mouth.   EASY COMFORT INSULIN SYRINGE 31G X 5/16" 1 ML MISC USE AS DIRECTED ONCE DAILY FOR LEVEMIR [place]LEXTOU[place] 100 DAY SUPPLY   gabapentin (NEURONTIN) 300 MG capsule Take by mouth.   glipiZIDE (GLUCOTROL XL) 5 MG 24 hr tablet Take 5 mg by mouth daily.   LEVEMIR 100 UNIT/ML injection Inject 0.4 mLs (40 Units total) into the skin daily.   metFORMIN (GLUCOPHAGE) 500 MG tablet Take 500 mg by mouth 2 (two) times daily.   oxyCODONE-acetaminophen (PERCOCET/ROXICET) 5-325 MG tablet Take 1 tablet by mouth every 4 (four) hours as needed.   sertraline (ZOLOFT) 100 MG tablet Take 100 mg by mouth daily.   simvastatin (ZOCOR) 10 MG tablet Take 10 mg by mouth daily.     Critical care time: 42 minutes  Chesley Mires, MD [place] Pager - [phone] 01/30/2021, 2:37 PM

## 2021-01-30 NOTE — Progress Notes (Signed)
EEG complete - results pending 

## 2021-01-30 NOTE — ED Notes (Signed)
Warming blanket placed on patient

## 2021-01-30 NOTE — ED Provider Notes (Signed)
[facility] EMERGENCY DEPARTMENT Provider Note   CSN: [id number] Arrival date & time: 01/29/21  2328     History Chief Complaint  Patient presents with   Altered Mental Status  Level 5 caveat due to altered mental status  Juan Juarez is a 65 y.o. male.  The history is provided by the EMS personnel. The history is limited by the condition of the patient.  Altered Mental Status Presenting symptoms: unresponsiveness   Severity:  Severe Most recent episode:  Today Timing:  Constant Progression:  Worsening Chronicity:  New Context: drug use   Patient with history of polysubstance use presents after intentional overdose.  Patient had informed family that he wanted to kill himself.  Patient was found in [place] motel unresponsive.  There was no signs of trauma.  Patient found multiple pill bottles around him.  Those included metformin, clobazem, zoloft, xanax EMS reports on their arrival patient was unresponsive and hypoxic.  Patient was given 2 mg of Narcan with minimal improvement.  Glucose was over 100. No other details are known on arrival   EMS had placed right tibia IO prior to arrival Past Medical History:  Diagnosis Date   Anxiety    Chronic elbow pain, left    Depression    Diabetes mellitus, type II (HCC)    type II   Gout    Head injury 12/2017   Hepatitis C without hepatic coma    Hypertension    denies   Inflammatory arthropathy    left elbow   Insomnia    Non compliance w medication regimen    Osteoarthritis    Polysubstance abuse (HCC)    Prostate cancer (HCC)    Protein-calorie malnutrition, severe (HCC) 09/13/2016   Seizure (HCC) 12/2017   after head Injury   Thrombocytopenia [facility])     Patient Active Problem List   Diagnosis Date Noted   Malignant neoplasm of prostate (HCC) 10/18/2020   Pain 04/06/2019   Septic arthritis (HCC) 01/01/2019   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HCC) 11/05/2018   Hyperosmolar (nonketotic) coma (HCC) 09/02/2018   Type 2  diabetes mellitus with hyperglycemia, with long-term current use of insulin (HCC) 09/02/2018   Status epilepticus (HCC) 01/22/2018   Macrocytic anemia 01/22/2018   Seizure (HCC) 01/22/2018   Subdural hematoma (HCC) 12/31/2017   Chronic hepatitis C without hepatic coma (HCC) 12/31/2017   Alcoholic cirrhosis of liver without ascites (HCC) 12/31/2017   Hyperammonemia (HCC) 11/09/2017   Diabetic hyperosmolar non-ketotic state (HCC) 11/07/2017   Osteoarthritis 11/07/2017   Tobacco abuse 11/07/2017   Hepatic cirrhosis (HCC) 10/23/2017   Splenomegaly    Hypoalbuminemia    Cocaine abuse (HCC) 05/01/2017   Polysubstance abuse (HCC) 09/13/2016   Elevated LFTs 09/13/2016   Protein-calorie malnutrition, severe (HCC) 09/13/2016   Depression 09/13/2016   Thrombocytopenia (HCC) 09/13/2016   Diabetes mellitus with hyperglycemia (HCC) 05/13/2015   Essential hypertension, benign 05/13/2015   Smoker 05/13/2015   Back pain at L4-L5 level 11/05/2013   Neck pain 11/05/2013   Cervical spondylosis 11/05/2013   Spinal stenosis of lumbar region 11/05/2013    Past Surgical History:  Procedure Laterality Date   BURR HOLE Right 01/22/2018   Procedure: RIGHT BURR HOLES;  Surgeon: Tressie Stalker, MD;  Location: [facility] OR;  Service: Neurosurgery;  Laterality: Right;   I & D EXTREMITY Left 11/06/2018   Procedure: IRRIGATION AND DEBRIDEMENT LEFT ELBOW JOINT;  Surgeon: Dairl Ponder, MD;  Location: [facility] OR;  Service: Orthopedics;  Laterality: Left;  INCISION AND DRAINAGE Left 10/24/2018   Procedure: INCISION AND DRAINAGE LEFT ELBOW WITH MASS EXCISION;  Surgeon: Charlotte Crumb, MD;  Location: [facility];  Service: Orthopedics;  Laterality: Left;   MULTIPLE TOOTH EXTRACTIONS     None         Family History  Problem Relation Age of Onset   Thyroid disease Sister    Colon cancer Neg Hx    Colon polyps Neg Hx    Breast cancer Neg Hx    Prostate cancer Neg Hx    Pancreatic cancer Neg Hx     Social History    Tobacco Use   Smoking status: Every Day    Packs/day: 0.50    Years: 50.00    Pack years: 25.00    Types: Cigarettes   Smokeless tobacco: Never  Vaping Use   Vaping Use: Never used  Substance Use Topics   Alcohol use: Not Currently    Comment: quit 2019   Drug use: Not Currently    Types: Marijuana, Cocaine    Comment: cocaine last use 10/26/2018, Marijuana sping 2019    Home Medications Prior to Admission medications   Medication Sig Start Date End Date Taking? Authorizing Provider  ALPRAZolam Duanne Moron) 0.5 MG tablet Take 0.5 mg by mouth at bedtime as needed. 06/10/19   [provider]  blood glucose meter kit and supplies Dispense based on patient and insurance preference. Use up to four times daily as directed. (FOR ICD-10 E10.9, E11.9). 09/03/18   Johnson, Clanford L, MD  cloBAZam (ONFI) 10 MG tablet clobazam 10 mg tablet  TAKE ONE TABLET BY MOUTH EVERY DAY    [provider]  doxepin (SINEQUAN) 75 MG capsule Take 75 mg by mouth.    [provider]  EASY COMFORT INSULIN SYRINGE 31G X 5/16" 1 ML MISC USE AS DIRECTED ONCE DAILY FOR LEVEMIR [place] 100 DAY SUPPLY 08/15/20   [provider]  gabapentin (NEURONTIN) 300 MG capsule Take by mouth. 04/01/19   [provider]  glipiZIDE (GLUCOTROL XL) 5 MG 24 hr tablet Take 5 mg by mouth daily. 06/10/19   [provider]  LEVEMIR 100 UNIT/ML injection Inject 0.4 mLs (40 Units total) into the skin daily. 09/03/18   Johnson, Clanford L, MD  metFORMIN (GLUCOPHAGE) 500 MG tablet Take 500 mg by mouth 2 (two) times daily. 10/02/18   [provider]  oxyCODONE-acetaminophen (PERCOCET/ROXICET) 5-325 MG tablet Take 1 tablet by mouth every 4 (four) hours as needed.    [provider]  sertraline (ZOLOFT) 100 MG tablet Take 100 mg by mouth daily. 10/08/19   [provider]  simvastatin (ZOCOR) 10 MG tablet Take 10 mg by mouth daily. 06/10/19   [provider]     Allergies    Patient has no known allergies.  Review of Systems   Review of Systems  Unable to perform ROS: Mental status change   Physical Exam Updated Vital Signs BP 100/71   Pulse (!) 54   Temp (!) 93.6 F (34.2 C)   Resp 16   Ht 1.702 m ($Remove'5\' 7"'uWPsPUr$ )   Wt 86.2 kg   SpO2 98%   BMI 29.76 kg/m   Physical Exam CONSTITUTIONAL: Disheveled, unresponsive HEAD: Normocephalic/atraumatic, no visible trauma EYES: Pupils pinpoint bilaterally ENMT: Mucous membranes moist, poor dentition NECK: supple no meningeal signs SPINE/BACK: No bruising/crepitance/stepoffs noted to spine CV: S1/S2 noted, no murmurs/rubs/gallops noted LUNGS: Coarse breath sounds bilaterally noted with bagging ABDOMEN: soft, nondistended OH:FGBMSX external genitalia, nurse  present for exam NEURO: Pt is unresponsive, GCS 3 EXTREMITIES: pulses normal/equal, full ROM, IO to right tibia SKIN: Cool to touch PSYCH: Unable to assess  ED Results / Procedures / Treatments   Labs (all labs ordered are listed, but only abnormal results are displayed) Labs Reviewed  COMPREHENSIVE METABOLIC PANEL - Abnormal; Notable for the following components:      Result Value   Sodium 134 (*)    Glucose, Bld 155 (*)    BUN 28 (*)    Creatinine, Ser 1.37 (*)    Calcium 7.9 (*)    Albumin 2.9 (*)    GFR, Estimated 57 (*)    Anion gap 3 (*)    All other components within normal limits  CBC WITH DIFFERENTIAL/PLATELET - Abnormal; Notable for the following components:   RBC 4.15 (*)    HCT 37.8 (*)    All other components within normal limits  URINALYSIS, COMPLETE (UACMP) WITH MICROSCOPIC - Abnormal; Notable for the following components:   Hgb urine dipstick SMALL (*)    Protein, ur 100 (*)    Bacteria, UA RARE (*)    All other components within normal limits  RAPID URINE DRUG SCREEN, HOSP PERFORMED - Abnormal; Notable for the following components:   Cocaine POSITIVE (*)    Benzodiazepines POSITIVE (*)    All other components  within normal limits  ACETAMINOPHEN LEVEL - Abnormal; Notable for the following components:   Acetaminophen (Tylenol), Serum <10 (*)    All other components within normal limits  SALICYLATE LEVEL - Abnormal; Notable for the following components:   Salicylate Lvl <1.6 (*)    All other components within normal limits  BLOOD GAS, ARTERIAL - Abnormal; Notable for the following components:   pCO2 arterial 30.9 (*)    pO2, Arterial 274 (*)    Bicarbonate 18.4 (*)    Acid-base deficit 7.5 (*)    All other components within normal limits  RESP PANEL BY RT-PCR (FLU A&B, COVID) [id number]  URINE CULTURE  ETHANOL  LACTIC ACID, PLASMA  AMMONIA    EKG EKG Interpretation  Date/Time:  Sunday January 29 2021 23:33:08 EDT Ventricular Rate:  69 PR Interval:  180 QRS Duration: 108 QT Interval:  432 QTC Calculation: 463 R Axis:   -23 Text Interpretation: Sinus rhythm Borderline left axis deviation Borderline low voltage, extremity leads Confirmed by Ripley Fraise [phone]) on 01/29/2021 11:48:15 PM  Radiology DG Chest Port 1 View  Result Date: 01/30/2021 CLINICAL DATA:  Altered mental status. Found unresponsive post intubation. EXAM: PORTABLE CHEST 1 VIEW COMPARISON:  Radiograph 11/15/2020 FINDINGS: Endotracheal tube tip is 3 cm from the carina. Enteric tube is in place. Tip is below the diaphragm in the stomach, side-port in the region of the gastroesophageal junction. Lung volumes are low. Stable heart size and mediastinal contours. Streaky bibasilar atelectasis without confluent airspace disease. No convincing pulmonary edema. No pneumothorax or large pleural effusion. No acute osseous abnormalities are seen. IMPRESSION: 1. Endotracheal tube tip 3 cm from the carina. 2. Enteric tube tip below the diaphragm in the stomach, side-port in the region of the gastroesophageal junction. Recommend advancement of 3-4 cm. 3. Low lung volumes with streaky bibasilar atelectasis. Electronically Signed   By: Keith Rake M.D.   On: 01/30/2021 00:14    Procedures Procedure Name: Intubation Date/Time: 01/30/2021 12:00 AM Performed by: Ripley Fraise, MD Pre-anesthesia Checklist: Patient identified, Patient being monitored and Suction available Oxygen Delivery Method: Ambu bag Preoxygenation: Pre-oxygenation with 100% oxygen Induction  Type: Rapid sequence Ventilation: Mask ventilation without difficulty Laryngoscope Size: Glidescope Grade View: Grade I Tube size: 7.5 mm Number of attempts: 1 Airway Equipment and Method: Video-laryngoscopy Placement Confirmation: ETT inserted through vocal cords under direct vision, CO2 detector and Breath sounds checked- equal and bilateral Secured at: 24 cm Tube secured with: ETT holder    .Critical Care  Date/Time: 01/30/2021 1:01 AM Performed by: Ripley Fraise, MD Authorized by: Ripley Fraise, MD   Critical care provider statement:    Critical care time (minutes):  135   Critical care start time:  01/29/2021 11:45 PM   Critical care end time:  01/30/2021 2:00 AM   Critical care time was exclusive of:  Separately billable procedures and treating other patients   Critical care was necessary to treat or prevent imminent or life-threatening deterioration of the following conditions:  Respiratory failure, shock and toxidrome   Critical care was time spent personally by me on the following activities:  Discussions with consultants, evaluation of patient's response to treatment, examination of patient, re-evaluation of patient's condition, pulse oximetry, ordering and review of radiographic studies, ordering and review of laboratory studies, ordering and performing treatments and interventions, gastric intubation, development of treatment plan with patient or surrogate, obtaining history from patient or surrogate, review of old charts and ventilator management (Discussed with family)   I assumed direction of critical care for this patient from another provider in  my specialty: no     Care discussed with: admitting provider     Medications Ordered in ED Medications  fentaNYL (SUBLIMAZE) injection 50 mcg (has no administration in time range)  fentaNYL (SUBLIMAZE) injection 50 mcg (has no administration in time range)  midazolam (VERSED) injection 1 mg (has no administration in time range)  midazolam (VERSED) injection 1 mg (has no administration in time range)  etomidate (AMIDATE) injection 20 mg (20 mg Intravenous Given 01/29/21 2335)  succinylcholine (ANECTINE) injection 100 mg (100 mg Intravenous Given 01/29/21 2335)  sodium chloride 0.9 % bolus 4,000 mL (0 mLs Intravenous Stopped 01/30/21 0022)  lactated ringers bolus 1,000 mL (0 mLs Intravenous Stopped 01/30/21 0134)  sodium bicarbonate injection 50 mEq (50 mEq Intravenous Given 01/30/21 0110)    ED Course  I have reviewed the triage vital signs and the nursing notes.  Pertinent labs & imaging results that were available during my care of the patient were reviewed by me and considered in my medical decision making (see chart for details).    MDM Rules/Calculators/A&P                           Patient presents as a presumed overdose.  He had made a threat to harm himself.  Patient arrived unresponsive and hypoxic.  I was able to use bag mask valve without difficulty and he became 100 percent O2 sats Patient was intubated without difficulty.  I assisted with NG tube placement  he was initially hypotensive and IV access was established and he was given multiple liters of fluid.  Nursing staff remove the right IO from the right tibia that EMS placed I discussed the case with on-call Dr. Hardin Negus with critical care.  Patient may be amenable for admission to [place] are pending at this time  Patient was found with pills attached to his body.  Suspected it is his doxepin which is a TCA, with mild QRS widening, will give bicarb 1:44 AM Discussed with Dr. Josephine Cables for admission.  Also  spoke to family who confirmed the story.  It is reported the patient has history of metastatic prostate cancer.  He also appears to have a previous history of intracranial hemorrhage.  We will add on CT head. Case was also discussed with poison center and they have   given recommendations Ventilator has been adjusted per arterial blood gas 2:20 AM Vitals are improving. BP 113/75   Pulse (!) 58   Temp (!) 94.1 F (34.5 C)   Resp 16   Ht 1.702 m ($Remove'5\' 7"'SpZekPi$ )   Wt 86.2 kg   SpO2 99%   BMI 29.76 kg/m  Repeat EKG unchanged  EKG Interpretation  Date/Time:  Monday January 30 2021 02:03:36 EDT Ventricular Rate:  59 PR Interval:  213 QRS Duration: 104 QT Interval:  463 QTC Calculation: 459 R Axis:   -8 Text Interpretation: Sinus rhythm Borderline prolonged PR interval Low voltage, extremity leads NO SIGNIFICANT CHANGE SINCE LAST TRACING YESTERDAY Confirmed by Ripley Fraise [phone]) on 01/30/2021 2:18:41 AM      CT head is pending and the patient will be admitted to the hospitalist.  Final Clinical Impression(s) / ED Diagnoses Final diagnoses:  Intentional drug overdose, initial encounter ([place])  Acute respiratory failure with hypoxia ([place])  AKI (acute kidney injury) [facility])    Rx / [place] Orders ED Discharge Orders     None        Ripley Fraise, MD 01/30/21 [phone]

## 2021-01-30 NOTE — Progress Notes (Signed)
OG tube advance as ordered per MD. Ext. Length now 62.

## 2021-01-30 NOTE — Procedures (Addendum)
Patient Name: [name]  MRN: [id number]  Epilepsy Attending: Lora Havens  Referring Physician/Provider: Dr Shanon Brow Tat Date: 01/30/2021 Duration: 24.18mns  Patient history: 65year old male with altered mental status.  EEG to evaluate for seizures.  Level of alertness: Awake, asleep  AEDs during EEG study: None  Technical aspects: This EEG study was done with scalp electrodes positioned according to the 10-20 International system of electrode placement. Electrical activity was acquired at a sampling rate of '500Hz'$  and reviewed with a high frequency filter of '70Hz'$  and a low frequency filter of '1Hz'$ . EEG data were recorded continuously and digitally stored.   Description:  The posterior dominant rhythm consists of 8-9 Hz activity of moderate voltage (25-35 uV) seen predominantly in posterior head regions, symmetric and reactive to eye opening and eye closing. Sleep was characterized by vertex waves, sleep spindles (12 to 14 Hz), maximal frontocentral region. EEG showed intermittent generalized 3 to 6 Hz theta-delta slowing. Hyperventilation and photic stimulation were not performed.     ABNORMALITY - Intermittent slow, generalized  IMPRESSION: This study is suggestive of mild diffuse encephalopathy, nonspecific etiology. No seizures or epileptiform discharges were seen throughout the recording.  Azim Gillingham OBarbra Sarks

## 2021-01-30 NOTE — Progress Notes (Signed)
PROGRESS NOTE  Juan Juarez PHX:[id number] DOB: [birth year] DOA: 01/29/2021 PCP: Glenda Chroman, MD  Brief History:  65 year old male with a history of polysubstance abuse including cocaine, tobacco, and alcohol, diabetes mellitus type 2, hypertension, depression, metastatic prostate cancer, seizure disorder, chronic hepatitis C presenting with unresponsiveness.  Apparently, the patient informed his family that he wanted to kill himself.  The patient was found in a motel unresponsive and hypoxic.  Apparently, there were pill bottles strewn around him when he was found by EMS.  The patient was given Narcan with no improvement.  His CBG was greater than 100 at that time.  Upon arrival to the emergency department, the patient remained obtunded.  He was noted to be hypothermic and hypotensive.  He was intubated.  Patient was started on IV fluids.  The patient was given 5 L of fluid.  He was placed on a warming blanket.  Empiric antibiotics were started. The patient had labile vitals with Clobazam, sertraline, metformin, and alprazolam around him.  Assessment/Plan: Acute respiratory failure with hypoxia -Secondary to hypoventilation from altered mental status -Personally reviewed chest x-ray--low volumes, increased interstitial markings -Currently intubated -Consult PCCM -Wean to extubation  Acute toxic encephalopathy -Patient is currently unresponsive to protopathic and epicritic stimuli -Routine EEG -01/30/2021 CT brain negative for acute findings -TSH -B12 -Ammonia 26 -UA negative for pyuria -UDS positive for cocaine and benzodiazepines  Hypotension -Suspect volume depletion and medication induced -Check PCT -Lactic acid peaked 1.4 -A.m. cortisol -Echocardiogram -Personally reviewed EKG--sinus bradycardia, nonspecific T wave changes -Blood cultures x2 sets  Uncontrolled diabetes type 2 with hyperglycemia -NovoLog sliding scale -Check hemoglobin  A1c  FEN--hypophosphatemia/hyponatremia -Start enteral feeds -Replete  Thrombocytopenia -Likely secondary to hepatocellular injury -Patient has a history of alcohol abuse and chronic hep C -Has been chronic  Metastatic prostate cancer -Patient was under evaluation for XRT and androgen deprivation         Status is: Inpatient  Remains inpatient appropriate because:Hemodynamically unstable  Dispo: The patient is from: Home              Anticipated d/c is to: Home              Patient currently is not medically stable to d/c.   Difficult to place patient Yes        Family Communication:   Family at bedside  Consultants:  PCCM; palliative  Code Status:  FULL   DVT Prophylaxis:  [place] Lovenox   Procedures: As Listed in Progress Note Above  Antibiotics: None      Subjective: Patient on ventilator, unresponsive.  ROS not possible  Objective: Vitals:   01/30/21 0700 01/30/21 0715 01/30/21 0800 01/30/21 0803  BP: (!) 88/57 (!) 82/54 98/78   Pulse: (!) 50 (!) 50 (!) 51   Resp: $Remo'16 16 16   'yZvHT$ Temp:    (!) 97.5 F (36.4 C)  TempSrc:    Axillary  SpO2: 100% 100% 100%   Weight:      Height:        Intake/Output Summary (Last 24 hours) at 01/30/2021 1013 Last data filed at 01/30/2021 0200 Gross per 24 hour  Intake 7000 ml  Output 700 ml  Net 6300 ml   Weight change:  Exam:  General:  Pt is unresponsive to protopathic and epicritic stimuli HEENT: No icterus, No thrush, No neck mass, [place]/AT Cardiovascular: RRR, S1/S2, no rubs, no gallops Respiratory: bibasilar rales. No wheeze Abdomen: Soft/+BS,  non distended, no guarding Extremities: No edema, No lymphangitis, No petechiae, No rashes, no synovitis   Data Reviewed: I have personally reviewed following labs and imaging studies Basic Metabolic Panel: Recent Labs  Lab 01/29/21 2336 01/30/21 0432  NA 134*  --   K 4.8  --   CL 107  --   CO2 24  --   GLUCOSE 155*  --   BUN 28*  --   CREATININE 1.37*  1.09  CALCIUM 7.9*  --   MG  --  1.9  PHOS  --  2.3*   Liver Function Tests: Recent Labs  Lab 01/29/21 2336  AST 15  ALT 12  ALKPHOS 77  BILITOT 0.5  PROT 7.2  ALBUMIN 2.9*   No results for input(s): LIPASE, AMYLASE in the last 168 hours. Recent Labs  Lab 01/30/21 0117  AMMONIA 26   Coagulation Profile: No results for input(s): INR, PROTIME in the last 168 hours. CBC: Recent Labs  Lab 01/29/21 2336 01/30/21 0432  WBC 5.8 5.8  NEUTROABS 3.1  --   HGB 13.0 11.6*  HCT 37.8* 34.1*  MCV 91.1 93.2  PLT 151 127*   Cardiac Enzymes: No results for input(s): CKTOTAL, CKMB, CKMBINDEX, TROPONINI in the last 168 hours. BNP: Invalid input(s): POCBNP CBG: Recent Labs  Lab 01/30/21 0807  GLUCAP 120*   HbA1C: No results for input(s): HGBA1C in the last 72 hours. Urine analysis:    Component Value Date/Time   COLORURINE YELLOW 01/30/2021 0009   APPEARANCEUR CLEAR 01/30/2021 0009   APPEARANCEUR Clear 08/29/2020 1313   LABSPEC 1.009 01/30/2021 0009   PHURINE 6.0 01/30/2021 0009   GLUCOSEU NEGATIVE 01/30/2021 0009   HGBUR SMALL (A) 01/30/2021 0009   BILIRUBINUR NEGATIVE 01/30/2021 0009   BILIRUBINUR Negative 08/29/2020 1313   KETONESUR NEGATIVE 01/30/2021 0009   PROTEINUR 100 (A) 01/30/2021 0009   UROBILINOGEN 0.2 07/01/2014 2244   NITRITE NEGATIVE 01/30/2021 0009   LEUKOCYTESUR NEGATIVE 01/30/2021 0009   Sepsis Labs: $RemoveBefo'@LABRCNTIP'OckscfPQTub$ (procalcitonin:4,lacticidven:4) ) Recent Results (from the past 240 hour(s))  Resp Panel by RT-PCR (Flu A&B, Covid)     Status: None   Collection Time: 01/30/21 12:41 AM   Specimen: Nasopharyngeal(NP) swabs in vial transport medium  Result Value Ref Range Status   SARS Coronavirus 2 by RT PCR NEGATIVE NEGATIVE Final    Comment: (NOTE) SARS-CoV-2 target nucleic acids are NOT DETECTED.  The SARS-CoV-2 RNA is generally detectable in upper respiratory specimens during the acute phase of infection. The lowest concentration of SARS-CoV-2  viral copies this assay can detect is 138 copies/mL. A negative result does not preclude SARS-Cov-2 infection and should not be used as the sole basis for treatment or other patient management decisions. A negative result may occur with  improper specimen collection/handling, submission of specimen other than nasopharyngeal swab, presence of viral mutation(s) within the areas targeted by this assay, and inadequate number of viral copies(<138 copies/mL). A negative result must be combined with clinical observations, patient history, and epidemiological information. The expected result is Negative.  Fact Sheet for Patients:  EntrepreneurPulse.com.au  Fact Sheet for Healthcare Providers:  IncredibleEmployment.be  This test is no t yet approved or cleared by the [place] FDA and  has been authorized for detection and/or diagnosis of SARS-CoV-2 by FDA under an Emergency Use Authorization (EUA). This EUA will remain  in effect (meaning this test can be used) for the duration of the COVID-19 declaration under Section 564(b)(1) of the Act, 21 U.S.C.section 360bbb-3(b)(1), unless the authorization  is terminated  or revoked sooner.       Influenza A by PCR NEGATIVE NEGATIVE Final   Influenza B by PCR NEGATIVE NEGATIVE Final    Comment: (NOTE) The Xpert Xpress SARS-CoV-2/FLU/RSV plus assay is intended as an aid in the diagnosis of influenza from Nasopharyngeal swab specimens and should not be used as a sole basis for treatment. Nasal washings and aspirates are unacceptable for Xpert Xpress SARS-CoV-2/FLU/RSV testing.  Fact Sheet for Patients: EntrepreneurPulse.com.au  Fact Sheet for Healthcare Providers: IncredibleEmployment.be  This test is not yet approved or cleared by the [place] FDA and has been authorized for detection and/or diagnosis of SARS-CoV-2 by FDA under an Emergency Use Authorization  (EUA). This EUA will remain in effect (meaning this test can be used) for the duration of the COVID-19 declaration under Section 564(b)(1) of the Act, 21 U.S.C. section 360bbb-3(b)(1), unless the authorization is terminated or revoked.  Performed at [facility] , [address], [place], [place] [address]   MRSA Next Gen by PCR, Nasal     Status: None   Collection Time: 01/30/21  3:54 AM   Specimen: Nasal Mucosa; Nasal Swab  Result Value Ref Range Status   MRSA by PCR Next Gen NOT DETECTED NOT DETECTED Final    Comment: (NOTE) The GeneXpert MRSA Assay (FDA approved for NASAL specimens only), is one component of a comprehensive MRSA colonization surveillance program. It is not intended to diagnose MRSA infection nor to guide or monitor treatment for MRSA infections. Test performance is not FDA approved in patients less than 76 years old. Performed at [facility], [address], [place], [place] [address]      Scheduled Meds:  chlorhexidine gluconate (MEDLINE KIT)  15 mL Mouth Rinse BID   enoxaparin (LOVENOX) injection  40 mg Subcutaneous Q24H   mouth rinse  15 mL Mouth Rinse 10 times per day   pantoprazole (PROTONIX) IV  40 mg Intravenous Q24H   Continuous Infusions:  Procedures/Studies: CT Head Wo Contrast  Result Date: 01/30/2021 CLINICAL DATA:  Mental status change, unknown cause. Patient had informed family that he wanted to kill himself. Patient was found in a local motel unresponsive. AMS. EXAM: CT HEAD WITHOUT CONTRAST TECHNIQUE: Contiguous axial images were obtained from the base of the skull through the vertex without intravenous contrast. COMPARISON:  07/16/2020 FINDINGS: Brain: Moderate parenchymal volume loss, asymmetrically more severe involving the right temporal lobe, appears similar to prior examination. Borderline ventriculomegaly, asymmetrically more severe involving the right lateral ventricle likely represents the sequela of central atrophy and resultant ex vacuo  dilatation. Interval development of a remote lacunar infarct within the left thalamus. Known pontine infarct is not well appreciated on this examination. No evidence of acute intracranial hemorrhage or infarct. No abnormal mass effect or midline shift. No abnormal intra or extra-axial mass lesion or fluid collection. Cerebellum is unremarkable. Vascular: No asymmetric hyperdense vasculature at the skull base peer Skull: Burr holes within the right frontal and parietal regions are again noted. No acute calvarial fracture. Sinuses/Orbits: The orbits and paranasal sinuses are unremarkable. Other: Mastoid air cells and middle ear cavities are clear. Rounded except ule subcutaneous mass is partially visualized, similar to that noted on prior MRI examination. IMPRESSION: No acute intracranial abnormality. Relatively advanced senescent changes, asymmetrically more severe involving the right temporal lobe, possibly related to a remote trauma. This appears stable since prior examination. 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 Electronically Signed   By: Fidela Salisbury MD  On: 01/30/2021 02:34   DG Chest Port 1 View  Result Date: 01/30/2021 CLINICAL DATA:  Altered mental status. Found unresponsive post intubation. EXAM: PORTABLE CHEST 1 VIEW COMPARISON:  Radiograph 11/15/2020 FINDINGS: Endotracheal tube tip is 3 cm from the carina. Enteric tube is in place. Tip is below the diaphragm in the stomach, side-port in the region of the gastroesophageal junction. Lung volumes are low. Stable heart size and mediastinal contours. Streaky bibasilar atelectasis without confluent airspace disease. No convincing pulmonary edema. No pneumothorax or large pleural effusion. No acute osseous abnormalities are seen. IMPRESSION: 1. Endotracheal tube tip 3 cm from the carina. 2. Enteric tube tip below the diaphragm in the stomach, side-port in the region of the gastroesophageal junction. Recommend  advancement of 3-4 cm. 3. Low lung volumes with streaky bibasilar atelectasis. Electronically Signed   By: Keith Rake M.D.   On: 01/30/2021 00:14    Orson Eva, DO  [facility]  If 7PM-7AM, please contact night-coverage [email or web address] Password [email or web address] 01/30/2021, 10:13 AM   LOS: 0 days

## 2021-01-30 NOTE — Progress Notes (Signed)
eLink Physician-Brief Progress Note Patient Name: Juan Juarez DOB: 09-17-1955 MRN: [id number]   Date of Service  01/30/2021  HPI/Events of Note  48 M history of polysubstance abuse, DM, hypertension, Hep C, prostate CA, brought in after being found unresponsive in a motel secondary to intentional overdose. IO place on scene, intubated in the ED for airway protection. Empty pill bottles include, metformin, clobazepam, zoloft and xanax. He also has doxepin, gabapentin as home medications. Tox screen positive for benzodiazepines and cocaine. Poison control aware of case  eICU Interventions  On bicarb drip for mildly prolonged QT Intubated for airway protection  Head CT without intracranial process CXR ETT in place, OGT to be advance 3-4 cm. Discussed with bedsdie RN        Raquel Sarna T Gustie Bobb 01/30/2021, 4:55 AM

## 2021-01-30 NOTE — Progress Notes (Signed)
Initial Nutrition Assessment  DOCUMENTATION CODES:      INTERVENTION:  Initiate trickle feeds of Vital AF @ 20 ml/hr via OGT and increase by 10 ml every 8 hours to goal rate of 60 ml/hr (1440 ml q 24 hr).   Add 45 ml ProSource TF per tube.    Tube feeding regimen provides 1808 kcal (100% of needs), 130 grams of protein, and 1168 ml of H2O.     NUTRITION DIAGNOSIS:   Inadequate oral intake related to inability to eat as evidenced by NPO status.   GOAL:  Provide needs based on ASPEN/SCCM guidelines   MONITOR:  Vent status, Labs, I & O's, Weight trends  REASON FOR ASSESSMENT:   Ventilator/ Enteral nutrition initiation     ASSESSMENT: Patient is a 65 yo male with a history of DM2, Hep C, Malnutrition, polysubstance abuse and prostate cancer. He was found unresponsive, hypothermic, hypotensive. Acute respiratory failure, toxic encephalopathy, hypophosphatemia/hyponatremia.   Patient is currently intubated on ventilator support.  MV: 8.6 L/min Temp (24hrs), Avg:94.7 F (34.8 C), Min:93.2 F (34 C), Max:97.5 F (36.4 C)  Fentanyl, Versed- sedation.  Weight history and medications reviewed.    Intake/Output Summary (Last 24 hours) at 01/30/2021 1212 Last data filed at 01/30/2021 1000 Gross per 24 hour  Intake 7500 ml  Output 700 ml  Net 6800 ml     Labs: BMP Latest Ref Rng & Units 01/30/2021 01/29/2021 11/15/2020  Glucose 70 - 99 mg/dL - 155(H) 143(H)  BUN 8 - 23 mg/dL - 28(H) 31(H)  Creatinine 0.61 - 1.24 mg/dL 1.09 1.37(H) 1.22  BUN/Creat Ratio 10 - 24 - - -  Sodium 135 - 145 mmol/L - 134(L) 129(L)  Potassium 3.5 - 5.1 mmol/L - 4.8 4.6  Chloride 98 - 111 mmol/L - 107 104  CO2 22 - 32 mmol/L - 24 18(L)  Calcium 8.9 - 10.3 mg/dL - 7.9(L) 9.0      NUTRITION - FOCUSED PHYSICAL EXAM:  Unable to complete Nutrition-Focused physical exam at this time.    Diet Order:   Diet Order             Diet NPO time specified  Diet effective now                    EDUCATION NEEDS:  Not appropriate for education at this time  Skin:  Skin Assessment: Reviewed RN Assessment  Last BM:  unknown  Height:   Ht Readings from Last 1 Encounters:  01/30/21 '5\' 7"'$  (1.702 m)    Weight:   Wt Readings from Last 1 Encounters:  01/29/21 86.2 kg    Ideal Body Weight:   67 kg  BMI:  Body mass index is 29.76 kg/m.  Estimated Nutritional Needs:   Kcal:  1720-1790  Protein:  120-134 gr  Fluid:  > 2 liters normal needs   Colman Cater MS,RD,CSG,LDN Contact: AMION

## 2021-01-31 ENCOUNTER — Inpatient Hospital Stay (HOSPITAL_COMMUNITY): Payer: Medicare Other

## 2021-01-31 ENCOUNTER — Encounter (HOSPITAL_COMMUNITY): Payer: Self-pay | Admitting: Internal Medicine

## 2021-01-31 DIAGNOSIS — Z515 Encounter for palliative care: Secondary | ICD-10-CM | POA: Diagnosis not present

## 2021-01-31 DIAGNOSIS — J9601 Acute respiratory failure with hypoxia: Secondary | ICD-10-CM | POA: Diagnosis not present

## 2021-01-31 DIAGNOSIS — T50902A Poisoning by unspecified drugs, medicaments and biological substances, intentional self-harm, initial encounter: Secondary | ICD-10-CM | POA: Diagnosis not present

## 2021-01-31 DIAGNOSIS — Z7189 Other specified counseling: Secondary | ICD-10-CM | POA: Diagnosis not present

## 2021-01-31 DIAGNOSIS — G9340 Encephalopathy, unspecified: Secondary | ICD-10-CM

## 2021-01-31 DIAGNOSIS — J988 Other specified respiratory disorders: Secondary | ICD-10-CM | POA: Diagnosis not present

## 2021-01-31 DIAGNOSIS — N179 Acute kidney failure, unspecified: Secondary | ICD-10-CM | POA: Diagnosis not present

## 2021-01-31 LAB — COMPREHENSIVE METABOLIC PANEL
ALT: 12 U/L (ref 0–44)
AST: 11 U/L — ABNORMAL LOW (ref 15–41)
Albumin: 2.6 g/dL — ABNORMAL LOW (ref 3.5–5.0)
Alkaline Phosphatase: 62 U/L (ref 38–126)
Anion gap: 3 — ABNORMAL LOW (ref 5–15)
BUN: 26 mg/dL — ABNORMAL HIGH (ref 8–23)
CO2: 21 mmol/L — ABNORMAL LOW (ref 22–32)
Calcium: 7.8 mg/dL — ABNORMAL LOW (ref 8.9–10.3)
Chloride: 114 mmol/L — ABNORMAL HIGH (ref 98–111)
Creatinine, Ser: 1.37 mg/dL — ABNORMAL HIGH (ref 0.61–1.24)
GFR, Estimated: 57 mL/min — ABNORMAL LOW (ref >60)
Glucose, Bld: 169 mg/dL — ABNORMAL HIGH (ref 70–99)
Potassium: 4.2 mmol/L (ref 3.5–5.1)
Sodium: 138 mmol/L (ref 135–145)
Total Bilirubin: 0.6 mg/dL (ref 0.3–1.2)
Total Protein: 6.3 g/dL — ABNORMAL LOW (ref 6.5–8.1)

## 2021-01-31 LAB — GLUCOSE, CAPILLARY
Glucose-Capillary: 142 mg/dL — ABNORMAL HIGH (ref 70–99)
Glucose-Capillary: 193 mg/dL — ABNORMAL HIGH (ref 70–99)
Glucose-Capillary: 171 mg/dL — ABNORMAL HIGH (ref 70–99)
Glucose-Capillary: 142 mg/dL — ABNORMAL HIGH (ref 70–99)
Glucose-Capillary: 129 mg/dL — ABNORMAL HIGH (ref 70–99)

## 2021-01-31 LAB — CBC
HCT: 35.7 % — ABNORMAL LOW (ref 39.0–52.0)
Hemoglobin: 11.9 g/dL — ABNORMAL LOW (ref 13.0–17.0)
MCH: 31.6 pg (ref 26.0–34.0)
MCHC: 33.3 g/dL (ref 30.0–36.0)
MCV: 94.9 fL (ref 80.0–100.0)
Platelets: 120 10*3/uL — ABNORMAL LOW (ref 150–400)
RBC: 3.76 MIL/uL — ABNORMAL LOW (ref 4.22–5.81)
RDW: 14 % (ref 11.5–15.5)
WBC: 8.8 10*3/uL (ref 4.0–10.5)
nRBC: 0 % (ref 0.0–0.2)

## 2021-01-31 LAB — URINE CULTURE: Culture: NO GROWTH

## 2021-01-31 LAB — PROTIME-INR
INR: 1.2 (ref 0.8–1.2)
Prothrombin Time: 15 seconds (ref 11.4–15.2)

## 2021-01-31 LAB — APTT: aPTT: 29 seconds (ref 24–36)

## 2021-01-31 LAB — CORTISOL-AM, BLOOD: Cortisol - AM: 9.8 ug/dL (ref 6.7–22.6)

## 2021-01-31 MED ORDER — INSULIN ASPART 100 UNIT/ML IJ SOLN: 0.0000 [IU] | Freq: Every day | INTRAMUSCULAR | Status: DC

## 2021-01-31 MED ORDER — LORAZEPAM 2 MG/ML IJ SOLN: 1.0000 mg | Freq: Once | INTRAMUSCULAR | Status: DC

## 2021-01-31 MED ORDER — PANTOPRAZOLE SODIUM 40 MG IV SOLR
40.0000 mg | Freq: Every day | INTRAVENOUS | Status: DC
  Administered 2021-02-01 – 2021-02-03 (×3): 40 mg via INTRAVENOUS
  Filled 2021-01-31 (×3): qty 40

## 2021-01-31 MED ORDER — DEXMEDETOMIDINE HCL IN NACL 400 MCG/100ML IV SOLN
0.4000 ug/kg/h | INTRAVENOUS | Status: DC
  Administered 2021-01-31 (×2): 0.7 ug/kg/h via INTRAVENOUS
  Administered 2021-02-01 (×3): 1.2 ug/kg/h via INTRAVENOUS
  Administered 2021-02-01: 0.7 ug/kg/h via INTRAVENOUS
  Administered 2021-02-02 (×6): 1.2 ug/kg/h via INTRAVENOUS
  Administered 2021-02-03: 0.399 ug/kg/h via INTRAVENOUS
  Filled 2021-01-31 (×15): qty 100

## 2021-01-31 MED ORDER — HALOPERIDOL LACTATE 5 MG/ML IJ SOLN
5.0000 mg | Freq: Once | INTRAMUSCULAR | Status: AC
  Administered 2021-01-31: 5 mg via INTRAVENOUS
  Filled 2021-01-31: qty 1

## 2021-01-31 MED ORDER — INSULIN ASPART 100 UNIT/ML IJ SOLN
0.0000 [IU] | Freq: Three times a day (TID) | INTRAMUSCULAR | Status: DC
  Administered 2021-01-31 – 2021-02-01 (×2): 2 [IU] via SUBCUTANEOUS
  Administered 2021-02-01 – 2021-02-02 (×4): 3 [IU] via SUBCUTANEOUS
  Administered 2021-02-03 (×2): 2 [IU] via SUBCUTANEOUS
  Administered 2021-02-04 – 2021-02-06 (×4): 3 [IU] via SUBCUTANEOUS
  Administered 2021-02-06: 2 [IU] via SUBCUTANEOUS
  Administered 2021-02-07: 5 [IU] via SUBCUTANEOUS
  Administered 2021-02-08 – 2021-02-09 (×3): 3 [IU] via SUBCUTANEOUS
  Administered 2021-02-09: 2 [IU] via SUBCUTANEOUS

## 2021-01-31 MED ORDER — DEXMEDETOMIDINE BOLUS VIA INFUSION
1.0000 ug/kg | Freq: Once | INTRAVENOUS | Status: AC
  Administered 2021-01-31: 86.2 ug via INTRAVENOUS
  Filled 2021-01-31: qty 87

## 2021-01-31 MED ORDER — POLYETHYLENE GLYCOL 3350 17 G PO PACK: 17.0000 g | PACK | Freq: Every day | ORAL | Status: DC | PRN

## 2021-01-31 MED ORDER — LORAZEPAM 2 MG/ML IJ SOLN
2.0000 mg | Freq: Once | INTRAMUSCULAR | Status: AC
  Administered 2021-01-31: 2 mg via INTRAVENOUS
  Filled 2021-01-31: qty 1

## 2021-01-31 NOTE — Progress Notes (Signed)
Patient was ordered restraints on 01-30-21 at 1400. Due to time I was not able to apply restraints until 1600 at which time I started documentation.
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
## 2021-01-31 NOTE — Progress Notes (Signed)
Nutrition Follow up   INTERVENTION:  If unable to wean today: Resume Vital AF @ 60 ml/hr via OGT (1440 ml q 24 hr).   Continue 45 ml ProSource TF per tube.    Tube feeding regimen provides 1808 kcal (100% of needs), 130 grams of protein, and 1168 ml of H2O.     NUTRITION DIAGNOSIS:   Inadequate oral intake related to inability to eat as evidenced by NPO status. -addressed with enteral feeding  GOAL:  Provide needs based on ASPEN/SCCM guidelines - progressing   MONITOR:  Vent status, Labs, I & O's, Weight trends    ASSESSMENT: Patient is a 65 yo male with a history of DM2, Hep C, Malnutrition, polysubstance abuse and prostate cancer. He was found unresponsive, hypothermic, hypotensive. Acute respiratory failure, toxic encephalopathy, hypophosphatemia/hyponatremia.   Patient is currently intubated on ventilator support.  MV: 8.6 L/min Temp (24hrs), Avg:100.2 F (37.9 C), Min:99.8 F (37.7 C), Max:100.7 F (38.2 C)  Fentanyl, Versed- sedation.  Weight history and medications reviewed.   8/2 talked with nurse. Tube feeding has been off since 1130 due to patient agitation and concern for aspiration. He is awake and moving around in bed. Required green mitts. MD if to follow up later this afternoon-possible extubation??     Intake/Output Summary (Last 24 hours) at 01/31/2021 1319 Last data filed at 01/31/2021 1133 Gross per 24 hour  Intake 750.33 ml  Output 1155 ml  Net -404.67 ml     Labs: BMP Latest Ref Rng & Units 01/31/2021 01/30/2021 01/29/2021  Glucose 70 - 99 mg/dL 169(H) - 155(H)  BUN 8 - 23 mg/dL 26(H) - 28(H)  Creatinine 0.61 - 1.24 mg/dL 1.37(H) 1.09 1.37(H)  BUN/Creat Ratio 10 - 24 - - -  Sodium 135 - 145 mmol/L 138 - 134(L)  Potassium 3.5 - 5.1 mmol/L 4.2 - 4.8  Chloride 98 - 111 mmol/L 114(H) - 107  CO2 22 - 32 mmol/L 21(L) - 24  Calcium 8.9 - 10.3 mg/dL 7.8(L) - 7.9(L)      NUTRITION - FOCUSED PHYSICAL EXAM: Nutrition-Focused physical exam completed.  Findings are no fat depletion, mild temporal, patellar, quadricept muscle depletion, and mild LE edema.    Diet Order:   Diet Order             Diet NPO time specified  Diet effective now                   EDUCATION NEEDS:  Not appropriate for education at this time  Skin:  Skin Assessment: Skin Integrity Issues: Skin Integrity Issues:: DTI DTI: sacrum  Last BM:  unknown  Height:   Ht Readings from Last 1 Encounters:  01/30/21 '5\' 7"'$  (1.702 m)    Weight:   Wt Readings from Last 1 Encounters:  01/29/21 86.2 kg    Ideal Body Weight:   67 kg  BMI:  Body mass index is 29.76 kg/m.  Estimated Nutritional Needs:   Kcal:  1720-1790  Protein:  120-134 gr  Fluid:  > 2 liters normal needs   Colman Cater MS,RD,CSG,LDN Contact: AMION

## 2021-01-31 NOTE — Procedures (Signed)
Extubation Procedure Note  Patient Details:   Name: Juan Juarez DOB: 1955-09-05 MRN: [id number]   Airway Documentation:    Vent end date: 01/31/21 Vent end time: 1406   Evaluation  O2 sats: stable throughout and currently acceptable Complications: No apparent complications Patient did tolerate procedure well. Bilateral Breath Sounds: Clear, Diminished   Yes  Patient extubated to a 4L [place]. RN and MD at bedside with RT during extubation. Patient able to speak after extubation. No stridor noted.  Renato Gails Renette Hsu 01/31/2021, 2:12 PM

## 2021-01-31 NOTE — Progress Notes (Signed)
EKG done shown to nurse and put on chart
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
## 2021-01-31 NOTE — Progress Notes (Signed)
PROGRESS NOTE  Juan Juarez WEX:[id number] DOB: July 16, 1955 DOA: 01/29/2021 PCP: Glenda Chroman, MD  Brief History:  65 year old male with a history of polysubstance abuse including cocaine, tobacco, and alcohol, diabetes mellitus type 2, hypertension, depression, metastatic prostate cancer, seizure disorder, chronic hepatitis C presenting with unresponsiveness.  Apparently, the patient informed his family that he wanted to kill himself.  The patient was found in a motel unresponsive and hypoxic.  Apparently, there were pill bottles strewn around him when he was found by EMS.  The patient was given Narcan with no improvement.  His CBG was greater than 100 at that time.  Upon arrival to the emergency department, the patient remained obtunded.  He was noted to be hypothermic and hypotensive.  He was intubated.  Patient was started on IV fluids.  The patient was given 5 L of fluid.  He was placed on a warming blanket.  Empiric antibiotics were started. The patient had labile vitals with Clobazam, sertraline, metformin, and alprazolam around him.    Assessment/Plan:  Acute respiratory failure with hypoxia -Secondary to hypoventilation from altered mental status -Personally reviewed chest x-ray--low volumes, increased interstitial markings -intubated in ED -Consult PCCM--appreciated -extubated 01/31/21   Acute toxic encephalopathy -Patient is currently unresponsive to protopathic and epicritic stimuli -Routine EEG--mild diffuse encephalopathy--no seizure -01/30/2021 CT brain negative for acute findings -TSH--1.741 -B12--605 -Ammonia 26 -UA negative for pyuria -UDS positive for cocaine and benzodiazepines -now agitated in restraints post extubation -one-on-one sitter ordered -TTS consult when more conversant/alert   Hypotension -Suspect volume depletion and medication induced -Check PCT <0.10 -Lactic acid peaked 1.4 -A.m. cortisol--9.8 -Personally reviewed EKG--sinus bradycardia,  nonspecific T wave changes -Blood cultures x2 sets--neg -improved with fluid resuscitation   Uncontrolled diabetes type 2 , controlled -NovoLog sliding scale -8/1 hemoglobin A1c--5.8   FEN--hypophosphatemia/hyponatremia -Started enteral feeds when intubated -Replete -recheck in am   Thrombocytopenia -Likely secondary to hepatocellular injury -Patient has a history of alcohol abuse and chronic hep C -Has been chronic   Metastatic prostate cancer -Patient was under evaluation for XRT and androgen deprivation   Goals of Care -remains full code -palliative following       Status is: Inpatient   Remains inpatient appropriate because:Hemodynamically unstable   Dispo: The patient is from: Home              Anticipated d/c is to: Home              Patient currently is not medically stable to d/c.              Difficult to place patient Yes               Family Communication:   Family at bedside   Consultants:  PCCM; palliative   Code Status:  FULL   DVT Prophylaxis:  [place] Lovenox     Procedures: As Listed in Progress Note Above   Antibiotics: None    Subjective: Patient agitated, cursing.  ROS limited.  Denies cp, sob.  Remainder unobtainable  Objective: Vitals:   01/31/21 1135 01/31/21 1200 01/31/21 1230 01/31/21 1300  BP: (!) 146/80 (!) 144/70 140/69 132/63  Pulse:  76 74 72  Resp:  (!) 24 (!) 24 (!) 22  Temp:  100.04 F (37.8 C) 100.04 F (37.8 C) 100.04 F (37.8 C)  TempSrc:      SpO2: 97% 98% 98% 98%  Weight:      Height:  Intake/Output Summary (Last 24 hours) at 01/31/2021 1419 Last data filed at 01/31/2021 1133 Gross per 24 hour  Intake 750.33 ml  Output 1155 ml  Net -404.67 ml   Weight change:  Exam:  General:  Pt is alert, agitated-does not follow commands appropriately,  HEENT: No icterus, No thrush, No neck mass, [place]/AT Cardiovascular: RRR, S1/S2, no rubs, no gallops Respiratory: bibasilar rales. No wheeze Abdomen:  Soft/+BS, non tender, non distended, no guarding Extremities: No edema, No lymphangitis, No petechiae, No rashes, no synovitis   Data Reviewed: I have personally reviewed following labs and imaging studies Basic Metabolic Panel: Recent Labs  Lab 01/29/21 2336 01/30/21 0432 01/31/21 0348  NA 134*  --  138  K 4.8  --  4.2  CL 107  --  114*  CO2 24  --  21*  GLUCOSE 155*  --  169*  BUN 28*  --  26*  CREATININE 1.37* 1.09 1.37*  CALCIUM 7.9*  --  7.8*  MG  --  1.9  --   PHOS  --  2.3*  --    Liver Function Tests: Recent Labs  Lab 01/29/21 2336 01/31/21 0348  AST 15 11*  ALT 12 12  ALKPHOS 77 62  BILITOT 0.5 0.6  PROT 7.2 6.3*  ALBUMIN 2.9* 2.6*   No results for input(s): LIPASE, AMYLASE in the last 168 hours. Recent Labs  Lab 01/30/21 0117  AMMONIA 26   Coagulation Profile: Recent Labs  Lab 01/31/21 0348  INR 1.2   CBC: Recent Labs  Lab 01/29/21 2336 01/30/21 0432 01/31/21 0348  WBC 5.8 5.8 8.8  NEUTROABS 3.1  --   --   HGB 13.0 11.6* 11.9*  HCT 37.8* 34.1* 35.7*  MCV 91.1 93.2 94.9  PLT 151 127* 120*   Cardiac Enzymes: Recent Labs  Lab 01/30/21 0432  CKTOTAL 58   BNP: Invalid input(s): POCBNP CBG: Recent Labs  Lab 01/30/21 1939 01/30/21 2345 01/31/21 0443 01/31/21 0751 01/31/21 1212  GLUCAP 172* 157* 142* 193* 171*   HbA1C: Recent Labs    01/30/21 0432  HGBA1C 5.8*   Urine analysis:    Component Value Date/Time   COLORURINE YELLOW 01/30/2021 0009   APPEARANCEUR CLEAR 01/30/2021 0009   APPEARANCEUR Clear 08/29/2020 1313   LABSPEC 1.009 01/30/2021 0009   PHURINE 6.0 01/30/2021 0009   GLUCOSEU NEGATIVE 01/30/2021 0009   HGBUR SMALL (A) 01/30/2021 0009   BILIRUBINUR NEGATIVE 01/30/2021 0009   BILIRUBINUR Negative 08/29/2020 1313   KETONESUR NEGATIVE 01/30/2021 0009   PROTEINUR 100 (A) 01/30/2021 0009   UROBILINOGEN 0.2 07/01/2014 2244   NITRITE NEGATIVE 01/30/2021 0009   LEUKOCYTESUR NEGATIVE 01/30/2021 0009   Sepsis  Labs: _0 (procalcitonin:4,lacticidven:4) ) Recent Results (from the past 240 hour(s))  Urine Culture     Status: None   Collection Time: 01/30/21 12:09 AM   Specimen: Urine, Catheterized  Result Value Ref Range Status   Specimen Description   Final    URINE, CATHETERIZED Performed at [facility], [address] [place], [place] [address]    Special Requests   Final    NONE Performed at [facility], [address], [place], [place] [address]    Culture   Final    NO GROWTH Performed at [facility], [place] [address], [place], [place] [address]    Report Status 01/31/2021 FINAL  Final  Resp Panel by RT-PCR (Flu A&B, Covid)     Status: None   Collection Time: 01/30/21 12:41 AM  Specimen: Nasopharyngeal(NP) swabs in vial transport medium  Result Value Ref Range Status   SARS Coronavirus 2 by RT PCR NEGATIVE NEGATIVE Final    Comment: (NOTE) SARS-CoV-2 target nucleic acids are NOT DETECTED.  The SARS-CoV-2 RNA is generally detectable in upper respiratory specimens during the acute phase of infection. The lowest concentration of SARS-CoV-2 viral copies this assay can detect is 138 copies/mL. A negative result does not preclude SARS-Cov-2 infection and should not be used as the sole basis for treatment or other patient management decisions. A negative result may occur with  improper specimen collection/handling, submission of specimen other than nasopharyngeal swab, presence of viral mutation(s) within the areas targeted by this assay, and inadequate number of viral copies(<138 copies/mL). A negative result must be combined with clinical observations, patient history, and epidemiological information. The expected result is Negative.  Fact Sheet for Patients:  EntrepreneurPulse.com.au  Fact Sheet for Healthcare Providers:  IncredibleEmployment.be  This test is no t yet approved or cleared by the [place] FDA and  has been  authorized for detection and/or diagnosis of SARS-CoV-2 by FDA under an Emergency Use Authorization (EUA). This EUA will remain  in effect (meaning this test can be used) for the duration of the COVID-19 declaration under Section 564(b)(1) of the Act, 21 U.S.C.section 360bbb-3(b)(1), unless the authorization is terminated  or revoked sooner.       Influenza A by PCR NEGATIVE NEGATIVE Final   Influenza B by PCR NEGATIVE NEGATIVE Final    Comment: (NOTE) The Xpert Xpress SARS-CoV-2/FLU/RSV plus assay is intended as an aid in the diagnosis of influenza from Nasopharyngeal swab specimens and should not be used as a sole basis for treatment. Nasal washings and aspirates are unacceptable for Xpert Xpress SARS-CoV-2/FLU/RSV testing.  Fact Sheet for Patients: EntrepreneurPulse.com.au  Fact Sheet for Healthcare Providers: IncredibleEmployment.be  This test is not yet approved or cleared by the [place] FDA and has been authorized for detection and/or diagnosis of SARS-CoV-2 by FDA under an Emergency Use Authorization (EUA). This EUA will remain in effect (meaning this test can be used) for the duration of the COVID-19 declaration under Section 564(b)(1) of the Act, 21 U.S.C. section 360bbb-3(b)(1), unless the authorization is terminated or revoked.  Performed at [facility], [address] [place], [place] [address]   MRSA Next Gen by PCR, Nasal     Status: None   Collection Time: 01/30/21  3:54 AM   Specimen: Nasal Mucosa; Nasal Swab  Result Value Ref Range Status   MRSA by PCR Next Gen NOT DETECTED NOT DETECTED Final    Comment: (NOTE) The GeneXpert MRSA Assay (FDA approved for NASAL specimens only), is one component of a comprehensive MRSA colonization surveillance program. It is not intended to diagnose MRSA infection nor to guide or monitor treatment for MRSA infections. Test performance is not FDA approved in patients less than 72  years old. Performed at [facility], [address], [place], [place] [address]   Culture, blood (Routine X 2) w Reflex to ID Panel     Status: None (Preliminary result)   Collection Time: 01/30/21  2:00 PM   Specimen: BLOOD RIGHT HAND  Result Value Ref Range Status   Specimen Description BLOOD RIGHT HAND  Final   Special Requests   Final    Blood Culture adequate volume BOTTLES DRAWN AEROBIC AND ANAEROBIC Performed at [facility], [address], [place],  [address]    Culture PENDING  Incomplete   Report Status PENDING  Incomplete  Culture, blood (Routine X 2) w Reflex to ID Panel     Status: None (Preliminary result)   Collection Time: 01/30/21  2:00 PM   Specimen: BLOOD LEFT HAND  Result Value Ref Range Status   Specimen Description BLOOD LEFT HAND  Final   Special Requests   Final    Blood Culture adequate volume BOTTLES DRAWN AEROBIC AND ANAEROBIC Performed at [facility], [address] [place], [place] [address]    Culture PENDING  Incomplete   Report Status PENDING  Incomplete     Scheduled Meds:  chlorhexidine gluconate (MEDLINE KIT)  15 mL Mouth Rinse BID   Chlorhexidine Gluconate Cloth  6 each Topical Daily   enoxaparin (LOVENOX) injection  40 mg Subcutaneous Q24H   insulin aspart  0-15 Units Subcutaneous TID WC   insulin aspart  0-5 Units Subcutaneous QHS   mouth rinse  15 mL Mouth Rinse 10 times per day   [START ON 02/01/2021] pantoprazole (PROTONIX) IV  40 mg Intravenous Daily   Continuous Infusions:  Procedures/Studies: CT Head Wo Contrast  Result Date: 01/30/2021 CLINICAL DATA:  Mental status change, unknown cause. Patient had informed family that he wanted to kill himself. Patient was found in a local motel unresponsive. AMS. EXAM: CT HEAD WITHOUT CONTRAST TECHNIQUE: Contiguous axial images were obtained from the base of the skull through the vertex without intravenous contrast. COMPARISON:  07/16/2020 FINDINGS: Brain: Moderate parenchymal volume loss,  asymmetrically more severe involving the right temporal lobe, appears similar to prior examination. Borderline ventriculomegaly, asymmetrically more severe involving the right lateral ventricle likely represents the sequela of central atrophy and resultant ex vacuo dilatation. Interval development of a remote lacunar infarct within the left thalamus. Known pontine infarct is not well appreciated on this examination. No evidence of acute intracranial hemorrhage or infarct. No abnormal mass effect or midline shift. No abnormal intra or extra-axial mass lesion or fluid collection. Cerebellum is unremarkable. Vascular: No asymmetric hyperdense vasculature at the skull base peer Skull: Burr holes within the right frontal and parietal regions are again noted. No acute calvarial fracture. Sinuses/Orbits: The orbits and paranasal sinuses are unremarkable. Other: Mastoid air cells and middle ear cavities are clear. Rounded except ule subcutaneous mass is partially visualized, similar to that noted on prior MRI examination. IMPRESSION: No acute intracranial abnormality. Relatively advanced senescent changes, asymmetrically more severe involving the right temporal lobe, possibly related to a remote trauma. This appears stable since prior examination. 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 Electronically Signed   By: Fidela Salisbury MD   On: 01/30/2021 02:34   DG Chest Port 1 View  Result Date: 01/31/2021 CLINICAL DATA:  Respiratory failure.  Intubation. EXAM: PORTABLE CHEST 1 VIEW COMPARISON:  01/29/2021. FINDINGS: Interim advancement of NG tube, its tip and side hole are in the stomach. Endotracheal tube stable position. Heart size stable. Low lung volumes with bibasilar atelectasis again noted. Bibasilar infiltrates cannot be excluded. No pleural effusion or pneumothorax. IMPRESSION: 1. Interim advancement of NG tube, its tip and side hole are in the stomach. Endotracheal tube in stable  position. 2. Low lung volumes with bibasilar atelectasis again noted. Bibasilar infiltrates cannot be excluded. Electronically Signed   By: Marcello Moores  Register   On: 01/31/2021 07:09   DG Chest Port 1 View  Result Date: 01/30/2021 CLINICAL DATA:  Altered mental status. Found unresponsive post intubation. EXAM: PORTABLE CHEST 1 VIEW COMPARISON:  Radiograph 11/15/2020 FINDINGS: Endotracheal tube tip is 3 cm from  the carina. Enteric tube is in place. Tip is below the diaphragm in the stomach, side-port in the region of the gastroesophageal junction. Lung volumes are low. Stable heart size and mediastinal contours. Streaky bibasilar atelectasis without confluent airspace disease. No convincing pulmonary edema. No pneumothorax or large pleural effusion. No acute osseous abnormalities are seen. IMPRESSION: 1. Endotracheal tube tip 3 cm from the carina. 2. Enteric tube tip below the diaphragm in the stomach, side-port in the region of the gastroesophageal junction. Recommend advancement of 3-4 cm. 3. Low lung volumes with streaky bibasilar atelectasis. Electronically Signed   By: Keith Rake M.D.   On: 01/30/2021 00:14   EEG adult  Result Date: 01/30/2021 Lora Havens, MD     01/30/2021  4:42 PM Patient Name: Juan Juarez MRN: [id number] Epilepsy Attending: Lora Havens Referring Physician/Provider: Dr Shanon Brow Twyla Dais Date: 01/30/2021 Duration: 24.20mns Patient history: 65year old male with altered mental status.  EEG to evaluate for seizures. Level of alertness: Awake, asleep AEDs during EEG study: None Technical aspects: This EEG study was done with scalp electrodes positioned according to the 10-20 International system of electrode placement. Electrical activity was acquired at a sampling rate of _0  and reviewed with a high frequency filter of _1  and a low frequency filter of _2 . EEG data were recorded continuously and digitally stored.  Description:  The posterior dominant rhythm consists of 8-9 Hz  activity of moderate voltage (25-35 uV) seen predominantly in posterior head regions, symmetric and reactive to eye opening and eye closing. Sleep was characterized by vertex waves, sleep spindles (12 to 14 Hz), maximal frontocentral region. EEG showed intermittent generalized 3 to 6 Hz theta-delta slowing. Hyperventilation and photic stimulation were not performed.   ABNORMALITY - Intermittent slow, generalized IMPRESSION: This study is suggestive of mild diffuse encephalopathy, nonspecific etiology. No seizures or epileptiform discharges were seen throughout the recording. Priyanka O Yadav   [place] SITE [place]ITE  Result Date: 01/30/2021 If [place] not attached, placement could not be confirmed due to current cardiac rhythm.   DOrson Eva DO  [facility]  If 7PM-7AM, please contact night-coverage [email or web address] Password [email or web address] 01/31/2021, 2:19 PM   LOS: 1 day

## 2021-01-31 NOTE — Progress Notes (Signed)
Patient increased agitation and confusion post extubation.  Patient climbing out of bed and not listening to RN's instructions.  MD Tat notified.

## 2021-02-01 DIAGNOSIS — F05 Delirium due to known physiological condition: Secondary | ICD-10-CM | POA: Diagnosis not present

## 2021-02-01 DIAGNOSIS — G9340 Encephalopathy, unspecified: Secondary | ICD-10-CM | POA: Diagnosis not present

## 2021-02-01 DIAGNOSIS — Z515 Encounter for palliative care: Secondary | ICD-10-CM | POA: Diagnosis not present

## 2021-02-01 DIAGNOSIS — T68XXXA Hypothermia, initial encounter: Secondary | ICD-10-CM | POA: Diagnosis not present

## 2021-02-01 DIAGNOSIS — J9601 Acute respiratory failure with hypoxia: Secondary | ICD-10-CM | POA: Diagnosis not present

## 2021-02-01 DIAGNOSIS — E1165 Type 2 diabetes mellitus with hyperglycemia: Secondary | ICD-10-CM | POA: Diagnosis not present

## 2021-02-01 DIAGNOSIS — Z7189 Other specified counseling: Secondary | ICD-10-CM | POA: Diagnosis not present

## 2021-02-01 DIAGNOSIS — T50902A Poisoning by unspecified drugs, medicaments and biological substances, intentional self-harm, initial encounter: Secondary | ICD-10-CM | POA: Diagnosis not present

## 2021-02-01 LAB — CBC
HCT: 37.9 % — ABNORMAL LOW (ref 39.0–52.0)
Hemoglobin: 12.5 g/dL — ABNORMAL LOW (ref 13.0–17.0)
MCH: 31.7 pg (ref 26.0–34.0)
MCHC: 33 g/dL (ref 30.0–36.0)
MCV: 96.2 fL (ref 80.0–100.0)
Platelets: 113 10*3/uL — ABNORMAL LOW (ref 150–400)
RBC: 3.94 MIL/uL — ABNORMAL LOW (ref 4.22–5.81)
RDW: 14 % (ref 11.5–15.5)
WBC: 8.5 10*3/uL (ref 4.0–10.5)
nRBC: 0 % (ref 0.0–0.2)

## 2021-02-01 LAB — GLUCOSE, CAPILLARY
Glucose-Capillary: 143 mg/dL — ABNORMAL HIGH (ref 70–99)
Glucose-Capillary: 115 mg/dL — ABNORMAL HIGH (ref 70–99)
Glucose-Capillary: 160 mg/dL — ABNORMAL HIGH (ref 70–99)
Glucose-Capillary: 150 mg/dL — ABNORMAL HIGH (ref 70–99)

## 2021-02-01 LAB — BASIC METABOLIC PANEL
Anion gap: 9 (ref 5–15)
BUN: 21 mg/dL (ref 8–23)
CO2: 18 mmol/L — ABNORMAL LOW (ref 22–32)
Calcium: 8.4 mg/dL — ABNORMAL LOW (ref 8.9–10.3)
Chloride: 114 mmol/L — ABNORMAL HIGH (ref 98–111)
Creatinine, Ser: 1.25 mg/dL — ABNORMAL HIGH (ref 0.61–1.24)
GFR, Estimated: >60 mL/min (ref >60)
Glucose, Bld: 149 mg/dL — ABNORMAL HIGH (ref 70–99)
Potassium: 4.3 mmol/L (ref 3.5–5.1)
Sodium: 141 mmol/L (ref 135–145)

## 2021-02-01 MED ORDER — KCL IN DEXTROSE-NACL 20-5-0.45 MEQ/L-%-% IV SOLN
INTRAVENOUS | Status: DC
Start: 2021-02-01 — End: 2021-02-02

## 2021-02-01 MED ORDER — MAGNESIUM SULFATE 2 GM/50ML IV SOLN
2.0000 g | Freq: Once | INTRAVENOUS | Status: AC
  Administered 2021-02-02: 2 g via INTRAVENOUS
  Filled 2021-02-01: qty 50

## 2021-02-01 MED ORDER — LORAZEPAM 2 MG/ML IJ SOLN: 1.0000 mg | Freq: Once | INTRAMUSCULAR | Status: AC

## 2021-02-01 MED ORDER — HALOPERIDOL LACTATE 5 MG/ML IJ SOLN
5.0000 mg | Freq: Four times a day (QID) | INTRAMUSCULAR | Status: AC | PRN
  Administered 2021-02-02: 5 mg via INTRAVENOUS
  Filled 2021-02-01: qty 1

## 2021-02-01 MED ORDER — LORAZEPAM 2 MG/ML IJ SOLN
INTRAMUSCULAR | Status: AC
  Administered 2021-02-01: 1 mg via INTRAVENOUS
  Filled 2021-02-01: qty 1

## 2021-02-01 NOTE — Progress Notes (Signed)
Patient wouldn't get back in bed, became aggressive.  2 RNs and Myself tried to get him in bed. We called security and Simona Huh and myself used STARR move to get him back to bed. Patient resting in bed drinking water.

## 2021-02-01 NOTE — Progress Notes (Signed)
[facility]   Patient name: Juan Juarez Admit date: 01/29/2021  DOB: April 13, 1956 LOS: 2  MRN: [id number] Consult date: 01/31/2021  Referring provider: Dr. Carles Collet, [facility]: Suicide attempt    History:  65 yo male smoker found unresponsive in motel room surrounded by pill bottles after intentional overdose.  Empty bottles of zoloft, metformin, xanax, klonopin, gabapentin, and doxepin.  Noted to have low temperature, oxygen and blood pressure in ER.  Required intubation for airway protection.  UDS positive for cocaine and benzo's.  Past medical history:  DM type 2, HTN, Hep C, HLD, Anxiety with depression, Gout, Insomnia, Polysubstance abuse, Prostate cancer, Seizure after head injury  Significant events:  8/01 admit 8/02 extubated; agitated combative after extubation and started on precedex.  Studies:  CT head 8/01 >> volume loss, remote Lt thalamic infarct EEG 8/01 >> intermittent generalized slowing  Micro:    Lines:  ETT 7/31 >> 8/01   Antibiotics:    Consults:      Interim history:  Sitter at bedside.  Precedex started overnight.  Vital signs:  BP (!) 172/74 (BP Location: Left Arm)   Pulse (!) 56   Temp 97.6 F (36.4 C) (Axillary)   Resp 18   Ht '5\' 7"'$  (1.702 m)   Wt 86.2 kg   SpO2 95%   BMI 29.76 kg/m   Intake/output:  I/O last 3 completed shifts: In: 1028.6 [I.V.:228.3; NG/GT:800.3] Out: 3105 [Urine:3105]   Physical exam:   General - somnolent Eyes - pupils reactive ENT - no sinus tenderness, no stridor Cardiac - regular rate/rhythm, no murmur Chest - equal breath sounds b/l, no wheezing or rales Abdomen - soft, non tender, + bowel sounds Extremities - no cyanosis, clubbing, or edema Skin - no rashes Neuro - RASS -2      Best practice:   DVT - lovenox SUP - protonix Nutrition - NPO Mobility - bed rest   Assessment/plan:   Agitated delerium. Intentional overdose. Hx of  polysubstance abuse, anxiety with depression, insomnia. - suicide precautions, bedside sitter - psychiatry to assess - precedex to keep RASS 0 to -1  DM type 2. - SSI  Hx of HTN. Hx of prostate cancer. - per primary team  Chronic thrombocytopenia. - baseline PLT count 120 to 135K - f/u CBC intermittently  Resolved hospital problems:  Compromised airway, Hypotension, Hypothermia  Goals of care/Family discussions:  Code status: full code  Labs:   CMP Latest Ref Rng & Units 02/01/2021 01/31/2021 01/30/2021  Glucose 70 - 99 mg/dL 149(H) 169(H) -  BUN 8 - 23 mg/dL 21 26(H) -  Creatinine 0.61 - 1.24 mg/dL 1.25(H) 1.37(H) 1.09  Sodium 135 - 145 mmol/L 141 138 -  Potassium 3.5 - 5.1 mmol/L 4.3 4.2 -  Chloride 98 - 111 mmol/L 114(H) 114(H) -  CO2 22 - 32 mmol/L 18(L) 21(L) -  Calcium 8.9 - 10.3 mg/dL 8.4(L) 7.8(L) -  Total Protein 6.5 - 8.1 g/dL - 6.3(L) -  Total Bilirubin 0.3 - 1.2 mg/dL - 0.6 -  Alkaline Phos 38 - 126 U/L - 62 -  AST 15 - 41 U/L - 11(L) -  ALT 0 - 44 U/L - 12 -    CBC Latest Ref Rng & Units 02/01/2021 01/31/2021 01/30/2021  WBC 4.0 - 10.5 K/uL 8.5 8.8 5.8  Hemoglobin 13.0 - 17.0 g/dL 12.5(L) 11.9(L) 11.6(L)  Hematocrit 39.0 - 52.0 % 37.9(L) 35.7(L) 34.1(L)  Platelets 150 - 400 K/uL 113(L) 120(L) 127(L)  ABG    Component Value Date/Time   PHART 7.388 01/30/2021 0807   PCO2ART 33.4 01/30/2021 0807   PO2ART 103 01/30/2021 0807   HCO3 21.0 01/30/2021 0807   TCO2 22 01/28/2018 0303   ACIDBASEDEF 4.4 (H) 01/30/2021 0807   O2SAT 97.9 01/30/2021 0807    CBG (last 3)  Recent Labs    01/31/21 1212 01/31/21 1619 01/31/21 1959  GLUCAP 171* 142* 129*    Critical care time: 32 minutes  Chesley Mires, MD [place] Pager - [phone] 02/01/2021, 8:39 AM
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
## 2021-02-01 NOTE — Consult Note (Signed)
Juan Juarez is a 65 y.o male  seen by this provider via tele psyche for attempted evaluation. Patient's speech garbled and incomprehensible. Unable to perform meaningful evaluation until patient is more alert and able to participate in assessment. Nursing staff to notify this provider later in the day if patient becomes more alert. He did open his eyes somewhat at my request to do so, otherwise he is lying on the hospital with minimal movement.   Will attempt assessment later per nursing.

## 2021-02-01 NOTE — Progress Notes (Signed)
Patient sitting on side of bed couldn't be redirected to lay down. I called the RN to assist and he wouldn't listen to neither one of [place]. Security was walking on the unit and he came in to assist this tech and we assisted the patient back to bed. Patient is resting in bed.

## 2021-02-01 NOTE — Progress Notes (Signed)
Palliative: Juan Juarez, Juan Juarez, is lying quietly in bed in a dark room.  He is mumbling, and will only briefly open his eyes to make eye contact when I request.  He appears disheveled.  I do not think that he can make his basic needs known.  There is no family at bedside at this time.  Nursing staff is at bedside for safety.    Juan Juarez tells me that he would like something to drink.  He is clearly not awake enough for this.  I do provide him an ice chip, but he tries to talk with ice chip in his mouth.  Although there is no overt sign or symptom of aspiration.  Juan Juarez returns to mumbling, but is thankfully not attempting to get out of bed at this time.  Conference with attending, bedside nursing staff, transition of care team related to patient condition, needs, goals of care PMT to continue to follow.  Plan: Continue to treat the treatable, full scope/full code.  Time for outcomes.  Unsure of need for rehab.  Awaiting psychiatric consult.     25 minutes  Quinn Axe, NP Palliative medicine team Team phone [phone] Greater than 50% of this time was spent counseling and coordinating care related to the above assessment and plan.

## 2021-02-02 ENCOUNTER — Inpatient Hospital Stay (HOSPITAL_COMMUNITY): Payer: Medicare Other

## 2021-02-02 DIAGNOSIS — G9340 Encephalopathy, unspecified: Secondary | ICD-10-CM

## 2021-02-02 DIAGNOSIS — T68XXXA Hypothermia, initial encounter: Secondary | ICD-10-CM | POA: Diagnosis not present

## 2021-02-02 DIAGNOSIS — J9601 Acute respiratory failure with hypoxia: Secondary | ICD-10-CM | POA: Diagnosis not present

## 2021-02-02 DIAGNOSIS — E1165 Type 2 diabetes mellitus with hyperglycemia: Secondary | ICD-10-CM | POA: Diagnosis not present

## 2021-02-02 DIAGNOSIS — F1910 Other psychoactive substance abuse, uncomplicated: Secondary | ICD-10-CM | POA: Diagnosis not present

## 2021-02-02 DIAGNOSIS — Z515 Encounter for palliative care: Secondary | ICD-10-CM | POA: Diagnosis not present

## 2021-02-02 DIAGNOSIS — T50902A Poisoning by unspecified drugs, medicaments and biological substances, intentional self-harm, initial encounter: Secondary | ICD-10-CM | POA: Diagnosis not present

## 2021-02-02 LAB — CBC WITH DIFFERENTIAL/PLATELET
Abs Immature Granulocytes: 0.06 10*3/uL (ref 0.00–0.07)
Basophils Absolute: 0 10*3/uL (ref 0.0–0.1)
Basophils Relative: 0 %
Eosinophils Absolute: 0 10*3/uL (ref 0.0–0.5)
Eosinophils Relative: 0 %
HCT: 32.9 % — ABNORMAL LOW (ref 39.0–52.0)
Hemoglobin: 11.3 g/dL — ABNORMAL LOW (ref 13.0–17.0)
Immature Granulocytes: 1 %
Lymphocytes Relative: 11 %
Lymphs Abs: 1.1 10*3/uL (ref 0.7–4.0)
MCH: 32 pg (ref 26.0–34.0)
MCHC: 34.3 g/dL (ref 30.0–36.0)
MCV: 93.2 fL (ref 80.0–100.0)
Monocytes Absolute: 0.7 10*3/uL (ref 0.1–1.0)
Monocytes Relative: 7 %
Neutro Abs: 8 10*3/uL — ABNORMAL HIGH (ref 1.7–7.7)
Neutrophils Relative %: 81 %
Platelets: 127 10*3/uL — ABNORMAL LOW (ref 150–400)
RBC: 3.53 MIL/uL — ABNORMAL LOW (ref 4.22–5.81)
RDW: 13.7 % (ref 11.5–15.5)
WBC: 9.8 10*3/uL (ref 4.0–10.5)
nRBC: 0 % (ref 0.0–0.2)

## 2021-02-02 LAB — GLUCOSE, CAPILLARY
Glucose-Capillary: 182 mg/dL — ABNORMAL HIGH (ref 70–99)
Glucose-Capillary: 174 mg/dL — ABNORMAL HIGH (ref 70–99)
Glucose-Capillary: 182 mg/dL — ABNORMAL HIGH (ref 70–99)
Glucose-Capillary: 184 mg/dL — ABNORMAL HIGH (ref 70–99)

## 2021-02-02 LAB — RENAL FUNCTION PANEL
Albumin: 3 g/dL — ABNORMAL LOW (ref 3.5–5.0)
Anion gap: 9 (ref 5–15)
BUN: 19 mg/dL (ref 8–23)
CO2: 22 mmol/L (ref 22–32)
Calcium: 8.4 mg/dL — ABNORMAL LOW (ref 8.9–10.3)
Chloride: 109 mmol/L (ref 98–111)
Creatinine, Ser: 1.22 mg/dL (ref 0.61–1.24)
GFR, Estimated: >60 mL/min (ref >60)
Glucose, Bld: 174 mg/dL — ABNORMAL HIGH (ref 70–99)
Phosphorus: 2.7 mg/dL (ref 2.5–4.6)
Potassium: 3.9 mmol/L (ref 3.5–5.1)
Sodium: 140 mmol/L (ref 135–145)

## 2021-02-02 LAB — LACTIC ACID, PLASMA
Lactic Acid, Venous: 1.5 mmol/L (ref 0.5–1.9)
Lactic Acid, Venous: 1.4 mmol/L (ref 0.5–1.9)

## 2021-02-02 LAB — URINALYSIS, ROUTINE W REFLEX MICROSCOPIC
Bacteria, UA: NONE SEEN
Bilirubin Urine: NEGATIVE
Glucose, UA: NEGATIVE mg/dL
Ketones, ur: NEGATIVE mg/dL
Leukocytes,Ua: NEGATIVE
Nitrite: NEGATIVE
Protein, ur: >=300 mg/dL — AB
RBC / HPF: >50 RBC/hpf — ABNORMAL HIGH (ref 0–5)
Specific Gravity, Urine: 1.012 (ref 1.005–1.030)
pH: 6 (ref 5.0–8.0)

## 2021-02-02 LAB — BRAIN NATRIURETIC PEPTIDE: B Natriuretic Peptide: 721 pg/mL — ABNORMAL HIGH (ref 0.0–100.0)

## 2021-02-02 LAB — PROCALCITONIN: Procalcitonin: <0.1 ng/mL

## 2021-02-02 MED ORDER — DIAZEPAM 5 MG/ML IJ SOLN
5.0000 mg | Freq: Once | INTRAMUSCULAR | Status: AC
  Administered 2021-02-02: 5 mg via INTRAVENOUS
  Filled 2021-02-02: qty 2

## 2021-02-02 MED ORDER — LABETALOL HCL 5 MG/ML IV SOLN
10.0000 mg | INTRAVENOUS | Status: DC | PRN
  Administered 2021-02-02: 10 mg via INTRAVENOUS
  Filled 2021-02-02: qty 4

## 2021-02-02 MED ORDER — PIPERACILLIN-TAZOBACTAM 3.375 G IVPB
3.3750 g | Freq: Four times a day (QID) | INTRAVENOUS | Status: DC
  Administered 2021-02-02 – 2021-02-03 (×2): 3.375 g via INTRAVENOUS
  Filled 2021-02-02 (×2): qty 50

## 2021-02-02 MED ORDER — QUETIAPINE FUMARATE 100 MG PO TABS
100.0000 mg | ORAL_TABLET | Freq: Two times a day (BID) | ORAL | Status: DC
  Administered 2021-02-03 – 2021-02-09 (×13): 100 mg via ORAL
  Filled 2021-02-02 (×13): qty 1

## 2021-02-02 MED ORDER — LACTATED RINGERS IV BOLUS (SEPSIS): 1000.0000 mL | Freq: Once | INTRAVENOUS | Status: DC

## 2021-02-02 MED ORDER — METOPROLOL TARTRATE 5 MG/5ML IV SOLN
5.0000 mg | Freq: Once | INTRAVENOUS | Status: AC
  Administered 2021-02-02: 5 mg via INTRAVENOUS
  Filled 2021-02-02: qty 5

## 2021-02-02 MED ORDER — FUROSEMIDE 10 MG/ML IJ SOLN
40.0000 mg | Freq: Once | INTRAMUSCULAR | Status: AC
  Administered 2021-02-02: 40 mg via INTRAVENOUS
  Filled 2021-02-02: qty 4

## 2021-02-02 MED ORDER — HALOPERIDOL LACTATE 5 MG/ML IJ SOLN
INTRAMUSCULAR | Status: AC
  Administered 2021-02-02: 5 mg via INTRAVENOUS
  Filled 2021-02-02: qty 1

## 2021-02-02 MED ORDER — ACETAMINOPHEN 650 MG RE SUPP
650.0000 mg | Freq: Four times a day (QID) | RECTAL | Status: DC | PRN
  Administered 2021-02-02: 650 mg via RECTAL
  Filled 2021-02-02: qty 1

## 2021-02-02 NOTE — Progress Notes (Signed)
[facility] night shift ICU coverage note.  The nursing staff reported that the patient was febrile with a temperature of 101 F and more tachypneic with RR at 37 bpm.  He he has been occasionally getting tachypneic.  Urinalysis, Routine w reflex microscopic Urine, Catheterized [id number] (Abnormal)   Collected: 02/02/21 2053   Updated: 02/02/21 2308   Specimen Source: Urine, Catheterized    Color, Urine YELLOW   APPearance CLEAR   Specific Gravity, Urine 1.012   pH 6.0   Glucose, UA NEGATIVE mg/dL   Hgb urine dipstick MODERATE Abnormal    Bilirubin Urine NEGATIVE   Ketones, ur NEGATIVE mg/dL   Protein, ur >=300 Abnormal  mg/dL   Nitrite NEGATIVE   Leukocytes,Ua NEGATIVE   RBC / HPF >50 High  RBC/hpf   WBC, UA 0-5 WBC/hpf   Bacteria, UA NONE SEEN   Hyaline Casts, UA PRESENT   Procalcitonin [id number]   Collected: 02/02/21 2128   Updated: 02/02/21 2255    Procalcitonin <0.10 ng/mL  Brain natriuretic peptide [id number] (Abnormal)   Collected: 02/02/21 0335   Updated: 02/02/21 2231   Specimen Type: Blood    B Natriuretic Peptide 721.0 High  pg/mL  CBC with Differential [id number] (Abnormal)   Collected: 02/02/21 2128   Updated: 02/02/21 2202   Specimen Type: Blood    WBC 9.8 K/uL   RBC 3.53 Low  MIL/uL   Hemoglobin 11.3 Low  g/dL   HCT 32.9 Low  %   MCV 93.2 fL   MCH 32.0 pg   MCHC 34.3 g/dL   RDW 13.7 %   Platelets 127 Low  K/uL   nRBC 0.0 %   Neutrophils Relative % 81 %   Neutro Abs 8.0 High  K/uL   Lymphocytes Relative 11 %   Lymphs Abs 1.1 K/uL   Monocytes Relative 7 %   Monocytes Absolute 0.7 K/uL   Eosinophils Relative 0 %   Eosinophils Absolute 0.0 K/uL   Basophils Relative 0 %   Basophils Absolute 0.0 K/uL   Immature Granulocytes 1 %   Abs Immature Granulocytes 0.06 K/uL  Lactic acid, plasma [id number]   Collected: 02/02/21 2128   Updated: 02/02/21 2201   Specimen Type: Blood    Lactic Acid, Venous 1.5 mmol/L   CLINICAL DATA:  Fever altered mental  status   EXAM: PORTABLE CHEST 1 VIEW   COMPARISON:  01/31/2021, 01/29/2021   FINDINGS: Removal of endotracheal tube. Low lung volumes with increased bilateral airspace disease. There is probable vascular calcification. No pneumothorax.   IMPRESSION: 1. Interim removal of endotracheal and esophageal tubes. 2. Interim development of vascular congestion and diffuse bilateral airspace opacities which may be secondary to edema, diffuse pneumonia, or combination of the 2.     Electronically Signed   By: Donavan Foil M.D.   On: 02/02/2021 21:17   01/28/2018 -------------------------------------------------------------------  LV EF: 55% -   60%   -------------------------------------------------------------------  Indications:      429.3 Cardiomegaly.   -------------------------------------------------------------------  History:   Risk factors:  Polysubstance use. Hypertension. Diabetes  mellitus.   -------------------------------------------------------------------  Study Conclusions   - Left ventricle: The cavity size was normal. Wall thickness was    normal. Systolic function was normal. The estimated ejection    fraction was in the range of 55% to 60%. Wall motion was normal;    there were no regional wall motion abnormalities. Features are    consistent with a pseudonormal left ventricular filling pattern,    with concomitant abnormal  relaxation and increased filling    pressure (grade 2 diastolic dysfunction).   Assessment:  Fever Questionable aspiration. Volume overload  He received a dose of furosemide and was started on Zosyn.  Tennis Must, MD.
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
## 2021-02-02 NOTE — Consult Note (Signed)
NP spoke with nurse Audelia Acton in the ICU via telephone this morning. He states that the patient has not been medically cleared. Audelia Acton, Therapist, sports., was advised to d/c the psyc consult. Please consult psychiatry team when patient is medically cleared and alert/conversant.    Per Dr. Kathie Dike, MD  on 02/01/21: TTS consult when more conversant/alert

## 2021-02-02 NOTE — Progress Notes (Signed)
Weaning precedex and adding oral agents.  D/w Dr. Roderic Palau.  PCCM can be available if needed.  Chesley Mires, MD [place] Pager - [phone] 02/02/2021, 2:27 PM

## 2021-02-02 NOTE — Progress Notes (Signed)
Palliative: Mr. Jojuan, [place], is lying quietly in bed.  He appears acutely/chronically ill.  He is on Precedex, and unable to participate in goals of care discussion.    Conference with bedside nursing staff and attending related to patient condition, needs. PMT to continue to follow.  Plan: Full scope/full code.  Time for outcomes.  Goal is to wean Precedex, have TTS evaluation.  Further [place] discussions with patient and family sometime next week.  15 minutes Quinn Axe, NP Palliative medicine team Team phone [phone] Greater than 50% of this time was spent counseling and coordinating care related to the above assessment and plan.

## 2021-02-02 NOTE — Progress Notes (Signed)
[facility] night shift ICU coverage note.  The nursing staff reports that the patient has been very restless and his systolic blood pressure has been progressively higher we readings ranging from the 170s to 190s.  Haloperidol 5 mg IVP every 6 hours as needed x2, magnesium sulfate 2 g IVPB and metoprolol 5 mg IVP were ordered.  Tennis Must, MD.

## 2021-02-02 NOTE — Progress Notes (Signed)
Pt continues to have delirium and has been physically and verbally aggressive towards staff throughout shift. Pt has also made several attempts to exit bed and refuses treatments/interventions. Pt has also become hypertensive w/ systolic [id number].  Olevia Bowens, MD paged regarding physical aggression and HTN. Orders placed for '5mg'$  PRN haldol, 2G Mg run, and 1x dose of 5 mg metoprolol. Will continue to monitor.
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
## 2021-02-03 DIAGNOSIS — J9601 Acute respiratory failure with hypoxia: Secondary | ICD-10-CM | POA: Diagnosis not present

## 2021-02-03 DIAGNOSIS — E1165 Type 2 diabetes mellitus with hyperglycemia: Secondary | ICD-10-CM | POA: Diagnosis not present

## 2021-02-03 DIAGNOSIS — T68XXXA Hypothermia, initial encounter: Secondary | ICD-10-CM | POA: Diagnosis not present

## 2021-02-03 DIAGNOSIS — T50902A Poisoning by unspecified drugs, medicaments and biological substances, intentional self-harm, initial encounter: Secondary | ICD-10-CM | POA: Diagnosis not present

## 2021-02-03 LAB — GLUCOSE, CAPILLARY
Glucose-Capillary: 144 mg/dL — ABNORMAL HIGH (ref 70–99)
Glucose-Capillary: 128 mg/dL — ABNORMAL HIGH (ref 70–99)
Glucose-Capillary: 107 mg/dL — ABNORMAL HIGH (ref 70–99)
Glucose-Capillary: 146 mg/dL — ABNORMAL HIGH (ref 70–99)

## 2021-02-03 LAB — BASIC METABOLIC PANEL
Anion gap: 9 (ref 5–15)
BUN: 18 mg/dL (ref 8–23)
CO2: 20 mmol/L — ABNORMAL LOW (ref 22–32)
Calcium: 7.9 mg/dL — ABNORMAL LOW (ref 8.9–10.3)
Chloride: 107 mmol/L (ref 98–111)
Creatinine, Ser: 1.4 mg/dL — ABNORMAL HIGH (ref 0.61–1.24)
GFR, Estimated: 55 mL/min — ABNORMAL LOW (ref >60)
Glucose, Bld: 125 mg/dL — ABNORMAL HIGH (ref 70–99)
Potassium: 3.5 mmol/L (ref 3.5–5.1)
Sodium: 136 mmol/L (ref 135–145)

## 2021-02-03 LAB — BRAIN NATRIURETIC PEPTIDE: B Natriuretic Peptide: 628 pg/mL — ABNORMAL HIGH (ref 0.0–100.0)

## 2021-02-03 MED ORDER — CLOBAZAM 10 MG PO TABS
10.0000 mg | ORAL_TABLET | Freq: Every day | ORAL | Status: DC
  Administered 2021-02-04 – 2021-02-09 (×6): 10 mg via ORAL
  Filled 2021-02-03 (×12): qty 1

## 2021-02-03 MED ORDER — PIPERACILLIN-TAZOBACTAM 3.375 G IVPB
3.3750 g | Freq: Three times a day (TID) | INTRAVENOUS | Status: DC
  Administered 2021-02-03 – 2021-02-04 (×4): 3.375 g via INTRAVENOUS
  Filled 2021-02-03 (×4): qty 50

## 2021-02-03 MED ORDER — FUROSEMIDE 10 MG/ML IJ SOLN
40.0000 mg | Freq: Once | INTRAMUSCULAR | Status: AC
  Administered 2021-02-03: 40 mg via INTRAVENOUS
  Filled 2021-02-03: qty 4

## 2021-02-03 NOTE — Plan of Care (Signed)
  Problem: Education: Goal: Knowledge of General Education information will improve Description: Including pain rating scale, medication(s)/side effects and non-pharmacologic comfort measures 02/03/2021 0044 by Marinus Maw, RN Outcome: Not Progressing 02/03/2021 0044 by Marinus Maw, RN Outcome: Not Progressing   Problem: Health Behavior/Discharge Planning: Goal: Ability to manage health-related needs will improve 02/03/2021 0044 by Marinus Maw, RN Outcome: Not Progressing 02/03/2021 0044 by Marinus Maw, RN Outcome: Not Progressing   Problem: Clinical Measurements: Goal: Ability to maintain clinical measurements within normal limits will improve 02/03/2021 0044 by Marinus Maw, RN Outcome: Progressing 02/03/2021 0044 by Marinus Maw, RN Outcome: Not Progressing Goal: Will remain free from infection 02/03/2021 0044 by Marinus Maw, RN Outcome: Not Progressing 02/03/2021 0044 by Marinus Maw, RN Outcome: Not Progressing Goal: Diagnostic test results will improve 02/03/2021 0044 by Marinus Maw, RN Outcome: Not Progressing 02/03/2021 0044 by Marinus Maw, RN Outcome: Not Progressing Goal: Cardiovascular complication will be avoided 02/03/2021 0044 by Marinus Maw, RN Outcome: Not Progressing 02/03/2021 0044 by Marinus Maw, RN Outcome: Not Progressing   Problem: Activity: Goal: Risk for activity intolerance will decrease 02/03/2021 0044 by Marinus Maw, RN Outcome: Not Progressing 02/03/2021 0044 by Marinus Maw, RN Outcome: Not Progressing   Problem: Nutrition: Goal: Adequate nutrition will be maintained 02/03/2021 0044 by Marinus Maw, RN Outcome: Not Progressing 02/03/2021 0044 by Marinus Maw, RN Outcome: Not Progressing   Problem: Coping: Goal: Level of anxiety will decrease 02/03/2021 0044 by Marinus Maw, RN Outcome: Not Progressing 02/03/2021 0044 by Marinus Maw, RN Outcome: Not Progressing   Problem: Elimination: Goal: Will not  experience complications related to bowel motility 02/03/2021 0044 by Marinus Maw, RN Outcome: Not Progressing 02/03/2021 0044 by Marinus Maw, RN Outcome: Not Progressing Goal: Will not experience complications related to urinary retention 02/03/2021 0044 by Marinus Maw, RN Outcome: Not Progressing 02/03/2021 0044 by Marinus Maw, RN Outcome: Not Progressing   Problem: Pain Managment: Goal: General experience of comfort will improve 02/03/2021 0044 by Marinus Maw, RN Outcome: Not Progressing 02/03/2021 0044 by Marinus Maw, RN Outcome: Not Progressing   Problem: Safety: Goal: Ability to remain free from injury will improve 02/03/2021 0044 by Marinus Maw, RN Outcome: Progressing 02/03/2021 0044 by Marinus Maw, RN Outcome: Not Progressing   Problem: Skin Integrity: Goal: Risk for impaired skin integrity will decrease 02/03/2021 0044 by Marinus Maw, RN Outcome: Not Progressing 02/03/2021 0044 by Marinus Maw, RN Outcome: Not Progressing   Problem: Activity: Goal: Ability to tolerate increased activity will improve 02/03/2021 0044 by Marinus Maw, RN Outcome: Not Progressing 02/03/2021 0044 by Marinus Maw, RN Outcome: Not Progressing   Problem: Role Relationship: Goal: Method of communication will improve 02/03/2021 0044 by Marinus Maw, RN Outcome: Not Progressing 02/03/2021 0044 by Marinus Maw, RN Outcome: Not Progressing   Problem: Safety: Goal: Non-violent Restraint(s) 02/03/2021 0044 by Marinus Maw, RN Outcome: Progressing 02/03/2021 0044 by Marinus Maw, RN Outcome: Not Progressing
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
## 2021-02-03 NOTE — Care Management Important Message (Signed)
Important Message  Patient Details  Name: Juan Juarez MRN: [id number] Date of Birth: [birth year]   Medicare Important Message Given:  Yes     [name] 02/03/2021, 4:03 PM

## 2021-02-04 ENCOUNTER — Inpatient Hospital Stay (HOSPITAL_COMMUNITY): Payer: Medicare Other

## 2021-02-04 DIAGNOSIS — Z515 Encounter for palliative care: Secondary | ICD-10-CM | POA: Diagnosis not present

## 2021-02-04 DIAGNOSIS — I509 Heart failure, unspecified: Secondary | ICD-10-CM

## 2021-02-04 DIAGNOSIS — J9601 Acute respiratory failure with hypoxia: Secondary | ICD-10-CM | POA: Diagnosis not present

## 2021-02-04 DIAGNOSIS — T50902A Poisoning by unspecified drugs, medicaments and biological substances, intentional self-harm, initial encounter: Secondary | ICD-10-CM | POA: Diagnosis not present

## 2021-02-04 DIAGNOSIS — Z7189 Other specified counseling: Secondary | ICD-10-CM | POA: Diagnosis not present

## 2021-02-04 LAB — CBC
HCT: 38 % — ABNORMAL LOW (ref 39.0–52.0)
Hemoglobin: 13 g/dL (ref 13.0–17.0)
MCH: 31.8 pg (ref 26.0–34.0)
MCHC: 34.2 g/dL (ref 30.0–36.0)
MCV: 92.9 fL (ref 80.0–100.0)
Platelets: 149 10*3/uL — ABNORMAL LOW (ref 150–400)
RBC: 4.09 MIL/uL — ABNORMAL LOW (ref 4.22–5.81)
RDW: 13.8 % (ref 11.5–15.5)
WBC: 8.8 10*3/uL (ref 4.0–10.5)
nRBC: 0 % (ref 0.0–0.2)

## 2021-02-04 LAB — RENAL FUNCTION PANEL
Albumin: 2.6 g/dL — ABNORMAL LOW (ref 3.5–5.0)
Anion gap: 9 (ref 5–15)
BUN: 21 mg/dL (ref 8–23)
CO2: 23 mmol/L (ref 22–32)
Calcium: 8.1 mg/dL — ABNORMAL LOW (ref 8.9–10.3)
Chloride: 105 mmol/L (ref 98–111)
Creatinine, Ser: 1.47 mg/dL — ABNORMAL HIGH (ref 0.61–1.24)
GFR, Estimated: 52 mL/min — ABNORMAL LOW (ref >60)
Glucose, Bld: 88 mg/dL (ref 70–99)
Phosphorus: 3.7 mg/dL (ref 2.5–4.6)
Potassium: 3.4 mmol/L — ABNORMAL LOW (ref 3.5–5.1)
Sodium: 137 mmol/L (ref 135–145)

## 2021-02-04 LAB — ECHOCARDIOGRAM COMPLETE
AR max vel: 1.21 cm2
AV Area VTI: 1.3 cm2
AV Area mean vel: 1.14 cm2
AV Mean grad: 10 mmHg
AV Peak grad: 19.2 mmHg
Ao pk vel: 2.19 m/s
Area-P 1/2: 1.25 cm2
Height: 67 in
S' Lateral: 2 cm
Weight: 3089.97 oz

## 2021-02-04 LAB — CULTURE, BLOOD (ROUTINE X 2)
Culture: NO GROWTH
Culture: NO GROWTH
Special Requests: ADEQUATE
Special Requests: ADEQUATE

## 2021-02-04 LAB — GLUCOSE, CAPILLARY
Glucose-Capillary: 85 mg/dL (ref 70–99)
Glucose-Capillary: 89 mg/dL (ref 70–99)
Glucose-Capillary: 183 mg/dL — ABNORMAL HIGH (ref 70–99)
Glucose-Capillary: 161 mg/dL — ABNORMAL HIGH (ref 70–99)
Glucose-Capillary: 155 mg/dL — ABNORMAL HIGH (ref 70–99)

## 2021-02-04 MED ORDER — AMOXICILLIN-POT CLAVULANATE 875-125 MG PO TABS
1.0000 | ORAL_TABLET | Freq: Two times a day (BID) | ORAL | Status: AC
  Administered 2021-02-04 – 2021-02-09 (×10): 1 via ORAL
  Filled 2021-02-04 (×11): qty 1

## 2021-02-04 MED ORDER — PANTOPRAZOLE SODIUM 40 MG PO TBEC
40.0000 mg | DELAYED_RELEASE_TABLET | Freq: Every day | ORAL | Status: DC
  Administered 2021-02-04 – 2021-02-09 (×6): 40 mg via ORAL
  Filled 2021-02-04 (×6): qty 1
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
## 2021-02-04 NOTE — Progress Notes (Signed)
*  PRELIMINARY RESULTS* Echocardiogram 2D Echocardiogram has been performed.  Juan Juarez 02/04/2021, 2:47 PM

## 2021-02-05 DIAGNOSIS — T50902A Poisoning by unspecified drugs, medicaments and biological substances, intentional self-harm, initial encounter: Secondary | ICD-10-CM | POA: Diagnosis not present

## 2021-02-05 DIAGNOSIS — Z7189 Other specified counseling: Secondary | ICD-10-CM | POA: Diagnosis not present

## 2021-02-05 DIAGNOSIS — J9601 Acute respiratory failure with hypoxia: Secondary | ICD-10-CM | POA: Diagnosis not present

## 2021-02-05 DIAGNOSIS — Z515 Encounter for palliative care: Secondary | ICD-10-CM | POA: Diagnosis not present

## 2021-02-05 LAB — GLUCOSE, CAPILLARY
Glucose-Capillary: 116 mg/dL — ABNORMAL HIGH (ref 70–99)
Glucose-Capillary: 189 mg/dL — ABNORMAL HIGH (ref 70–99)
Glucose-Capillary: 111 mg/dL — ABNORMAL HIGH (ref 70–99)
Glucose-Capillary: 145 mg/dL — ABNORMAL HIGH (ref 70–99)

## 2021-02-05 LAB — BASIC METABOLIC PANEL
Anion gap: 8 (ref 5–15)
BUN: 22 mg/dL (ref 8–23)
CO2: 22 mmol/L (ref 22–32)
Calcium: 8.1 mg/dL — ABNORMAL LOW (ref 8.9–10.3)
Chloride: 109 mmol/L (ref 98–111)
Creatinine, Ser: 1.32 mg/dL — ABNORMAL HIGH (ref 0.61–1.24)
GFR, Estimated: 59 mL/min — ABNORMAL LOW (ref >60)
Glucose, Bld: 125 mg/dL — ABNORMAL HIGH (ref 70–99)
Potassium: 3.5 mmol/L (ref 3.5–5.1)
Sodium: 139 mmol/L (ref 135–145)

## 2021-02-05 NOTE — BH Assessment (Signed)
Comprehensive Clinical Assessment (CCA) Note  02/05/2021 Mo[place] Lum [id number]   Disposition:  Per Merlyn Lot, NP, patient is recommended for inpatient psychiatric treatment  The patient demonstrates the following risk factors for suicide: Chronic risk factors for suicide include: psychiatric disorder of depression, medical illness multiple medical issues, and chronic pain. Acute risk factors for suicide include: family or marital conflict, social withdrawal/isolation, and loss (financial, interpersonal, professional). Protective factors for this patient include: positive therapeutic relationship, responsibility to others (children, family), and hope for the future. Considering these factors, the overall suicide risk at this point appears to be high. Patient is not appropriate for outpatient follow up.   PHQ2-9    Flowsheet Row ED to Hosp-Admission (Current) from 01/29/2021 in [place] Office Visit from 07/07/2019 in [facility] for Infectious Disease Office Visit from 01/15/2019 in [facility] Office Visit from 11/05/2018 in [facility] for [facility] Disease Office Visit from 08/15/2015 in [facility]  PHQ-2 Total Score 6 0 0 0 0  PHQ-9 Total Score 20 -- -- -- --      Flowsheet Row ED to Hosp-Admission (Current) from 01/29/2021 in [facility] ED from 11/15/2020 in [place] High Risk No Risk        Chief Complaint:  Chief Complaint  Patient presents with   Altered Mental Status   Depression   Suicidal   Visit Diagnosis: F33.2 MDD Recurrent Severe    CCA Screening, Triage and Referral (STR)  Patient Reported Information How did you hear about [place]? Self  What Is the Reason for Your Visit/Call Today? Patient is inpatient at [facility].  He is a 65 year old male with a history of polysubstance abuse including  cocaine, tobacco, and alcohol, diabetes mellitus type 2, hypertension, depression, metastatic prostate cancer, seizure disorder, chronic hepatitis C presenting with unresponsiveness.  Apparently, the patient informed his family that he wanted to kill himself.  The patient was found in a motel unresponsive and hypoxic.  Apparently, there were pill bottles strewn around him when he was found by EMS. TTS Assessment:  Patient states that he is no longer suicidal.  However, when asked if he intentionally tried to kill himself, he states that he did.  When asked the reasons for hoim wanting to kill himself, patient states that he has experienced a hard life and he states that things just don't seem right in his life.  He states that he has chronic pain issues in his elbow and states that he has 3 kids that have nothing to do with him until they need money.  He states that he has loaned two of his kids thirty-five hundred dollars that they have never paid back.  Patient states that he has never tried to kill himself before and states that he is not sure why he tried to kill himself this time.  Patient states, "I don't really want to die."  Patient states that he is treated for his depression and anxiety on an OP baisis at the [facility] by [facility].  Patient states that he has never had any inpatient treatment. Patient denies HI/Psychosis.  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  Patient states that his appetite is good, but states  that he has difficulty trying to go to sleep and states that he takes a .5 mg Xanax to help him go to sleep.  Patient states that he has minimal support in his life and states that he lives alone in [place].  Patient is alert and oriented.  His mood is depressed.  His judgment, insight and impulse control are impaired. His thoughts are mostly  organized. His memory appears to be intact. He does not appear to be responding to internal stimuli.  How Long Has This Been Causing You Problems? > than 6 months  What Do You Feel Would Help You the Most Today? Treatment for Depression or other mood problem   Have You Recently Had Any Thoughts About Hurting Yourself? Yes  Are You Planning to Commit Suicide/Harm Yourself At This time? No   Have you Recently Had Thoughts About [place]? No  Are You Planning to Harm Someone at This Time? No  Explanation: No data recorded  Have You Used Any Alcohol or Drugs in the Past 24 Hours? No  How Long Ago Did You Use Drugs or Alcohol? No data recorded What Did You Use and How Much? No data recorded  Do You Currently Have a Therapist/Psychiatrist? Yes  Name of Therapist/Psychiatrist: Crystal Montegue at the [facility] Recently Discharged From Any Office Practice or Programs? No  Explanation of Discharge From Practice/Program: No data recorded    CCA Screening Triage Referral Assessment Type of Contact: Tele-Assessment  Telemedicine Service Delivery:   Is this Initial or Reassessment? Initial Assessment  Date Telepsych consult ordered in [facility]:  02/04/21  Time Telepsych consult ordered in [facility]:  1610  Location of Assessment: [facility]  Provider Location: Other (comment) (provider working from home)   Collateral Involvement: none available   Does Patient Have a [place]? No data recorded Name and Contact of Legal Guardian: No data recorded If Minor and Not Living with Parent(s), Who has Custody? No data recorded Is CPS involved or ever been involved? Never  Is APS involved or ever been involved? Never   Patient Determined To Be At Risk for Harm To Self or Others Based on Review of Patient Reported Information or Presenting Complaint? Yes, for Self-Harm  Method: No data recorded Availability of Means: No data  recorded Intent: No data recorded Notification Required: No data recorded Additional Information for Danger to Others Potential: No data recorded Additional Comments for Danger to Others Potential: No data recorded Are There Guns or Other Weapons in Your Home? No data recorded Types of Guns/Weapons: No data recorded Are These Weapons Safely Secured?                            No data recorded Who Could Verify You Are Able To Have These Secured: No data recorded Do You Have any Outstanding Charges, Pending Court Dates, Parole/Probation? No data recorded Contacted To Inform of Risk of Harm To Self or Others: Unable to Contact:    Does Patient Present under Involuntary Commitment? 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   Mental  Health Symptoms Depression:   Change in energy/activity; Sleep (too much or little); Hopelessness   Duration of Depressive symptoms:  Duration of Depressive Symptoms: Greater than two weeks   Mania:   None   Anxiety:    Sleep; Tension; Worrying   Psychosis:   None   Duration of Psychotic symptoms:    Trauma:   None   Obsessions:   None   Compulsions:   None   Inattention:   None   Hyperactivity/Impulsivity:   None   Oppositional/Defiant Behaviors:   None   Emotional Irregularity:   Potentially harmful impulsivity; Intense/unstable relationships; Chronic feelings of emptiness   Other Mood/Personality Symptoms:   depressed mood, feelings of hopelessness    Mental Status Exam Appearance and self-care  Stature:   Average   Weight:   Average weight    Clothing:   Casual   Grooming:   Normal   Cosmetic use:   None   Posture/gait:   Normal   Motor activity:   Not Remarkable   Sensorium  Attention:   Normal   Concentration:   Normal   Orientation:   Object; Person; Place; Situation; Time   Recall/memory:   Normal   Affect and Mood  Affect:   Depressed   Mood:   Depressed   Relating  Eye contact:   Normal   Facial expression:   Depressed   Attitude toward examiner:   Cooperative   Thought and Language  Speech flow:  Clear and Coherent   Thought content:   Appropriate to Mood and Circumstances   Preoccupation:   None   Hallucinations:   None   Organization:  No data recorded  [employer] of Knowledge:   Average   Intelligence:   Average   Abstraction:   Normal   Judgement:   Impaired   Reality Testing:   Distorted   Insight:   Lacking   Decision Making:   Impulsive   Social Functioning  Social Maturity:   Impulsive   Social Judgement:   Normal   Stress  Stressors:   Family conflict; Financial; Relationship   Coping Ability:   Normal   Skill Deficits:   Decision making; Self-care   Supports:   Support needed     Religion: Religion/Spirituality Are You A Religious Person?:  (not assessed)  Leisure/Recreation: Leisure / Recreation Do You Have Hobbies?: No  Exercise/Diet: Exercise/Diet Do You Exercise?: No Have You Gained or Lost A Significant Amount of Weight in the Past Six Months?: No Do You Follow a Special Diet?: No Do You Have Any Trouble Sleeping?: No   CCA Employment/Education Employment/Work Situation: Employment / Work Technical sales engineer: On disability Why is Patient on Disability: medical issues and mental health issues How Long has Patient Been on Disability: 5-6 years Patient's Job has Been Impacted by Current Illness: No Has Patient ever Been in the [employer]?: No  Education: Education Is Patient  Currently Attending School?: No Did Physicist, medical?: No Did You Have An Individualized Education Program (IIEP): No Did You Have Any Difficulty At School?: No Patient's Education Has Been Impacted by Current Illness: No   CCA Family/Childhood History Family and Relationship History: Family history Marital status: Divorced Divorced, when?: not assessed What types of issues is patient dealing with in the relationship?: patient states that he was an alcoholic in his marriage Does patient have children?: Yes How many children?: 3 How is patient's relationship with their children?: Patient states that he only  sees his kids when they need something  Childhood History:  Childhood History By whom was/is the patient raised?: Both parents (parents divorced) Did patient suffer any verbal/emotional/physical/sexual abuse as a child?: No Did patient suffer from severe childhood neglect?: No Has patient ever been sexually abused/assaulted/raped as an adolescent or adult?: No Was the patient ever a victim of a crime or a disaster?: No Witnessed domestic violence?: No Has patient been affected by domestic violence as an adult?: No  Child/Adolescent Assessment:     CCA Substance Use Alcohol/Drug Use: Alcohol / Drug Use Pain Medications: see MAR Prescriptions: see MAR Over the Counter: see MAR History of alcohol / drug use?:  (patient states that he has not used any alcohol in 5-6 years)                         ASAM's:  Six Dimensions of Multidimensional Assessment  Dimension 1:  Acute Intoxication and/or Withdrawal Potential:      Dimension 2:  Biomedical Conditions and Complications:      Dimension 3:  Emotional, Behavioral, or Cognitive Conditions and Complications:     Dimension 4:  Readiness to Change:     Dimension 5:  Relapse, Continued use, or Continued Problem Potential:     Dimension 6:  Recovery/Living Environment:     ASAM Severity Score:    ASAM Recommended  Level of Treatment:     Substance use Disorder (SUD)    Recommendations for Services/Supports/Treatments:    Discharge Disposition:    DSM5 Diagnoses: Patient Active Problem List   Diagnosis Date Noted   AKI (acute kidney injury) ([place])    Acute respiratory failure with hypoxia ([place]) 01/30/2021   Intentional overdose of drug in tablet form ([place]) 01/30/2021   Hypothermia 01/30/2021   Bradycardia 01/30/2021   Hyperlipidemia 01/30/2021   Suicidal ideation 01/30/2021   Acute encephalopathy 01/30/2021   Malignant neoplasm of prostate ([place]) 10/18/2020   Pain 04/06/2019   Septic arthritis ([place]) 01/01/2019   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[place]) 11/05/2018   Hyperosmolar (nonketotic) coma ([place]) 09/02/2018   Type 2 diabetes mellitus with hyperglycemia, with long-term current use of insulin ([place]) 09/02/2018   Status epilepticus ([place]) 01/22/2018   Macrocytic anemia 01/22/2018   Seizure ([place]) 01/22/2018   Subdural hematoma ([place]) 12/31/2017   Chronic hepatitis C without hepatic coma ([place]) 0000000   Alcoholic cirrhosis of liver without ascites ([place]) 12/31/2017   Hyperammonemia ([place]) 11/09/2017   Diabetic hyperosmolar non-ketotic state ([place]) 11/07/2017   Osteoarthritis 11/07/2017   Tobacco abuse 11/07/2017   Hepatic cirrhosis ([place]) 10/23/2017   Splenomegaly    Hypoalbuminemia due to protein-calorie malnutrition ([place])    Cocaine abuse ([place]) 05/01/2017   Hyperglycemia due to diabetes mellitus ([place]) 09/13/2016   Polysubstance abuse ([employer]) 09/13/2016   Elevated LFTs 09/13/2016   Protein-calorie malnutrition, severe ([place]) 09/13/2016   Depression 09/13/2016   Thrombocytopenia ([place]) 09/13/2016   Diabetes mellitus with hyperglycemia ([place]) 05/13/2015   Essential hypertension, benign 05/13/2015   Smoker 05/13/2015   Back pain at L4-L5 level 11/05/2013   Neck pain 11/05/2013   Cervical spondylosis 11/05/2013   Spinal stenosis of lumbar region 11/05/2013     Referrals to Alternative  Service(s): Referred to Alternative Service(s):   Place:   Date:   Time:    Referred to Alternative Service(s):   Place:   Date:   Time:    Referred to Alternative Service(s):   Place:   Date:   Time:  Referred to Alternative Service(s):   Place:   Date:   Time:     Concha Sudol J Tamala Manzer, LCAS

## 2021-02-05 NOTE — Progress Notes (Signed)
Per Juan Juarez, patient meets criteria for inpatient treatment. There are no available or appropriate beds at [facility] today. CSW faxed referrals to the following facilities for review:  [facility]  Pending - No Request Sent N/A [address], [place] [address] [phone] [facility]  Pending - No Request Sent N/A [address], [place] [address] [place] --  [address] [place] [address] [phone] --  [facility]  Pending - No Request Sent N/A [address] [place] [address] [phone] --  [facility]  Pending - No Request Sent N/A [address] [place], [place] [address] [phone] --  [facility]  Pending - No Request Sent N/A [address] [place] [address] [phone] --  [facility]  Pending - No Request Sent N/A [address] [place] [address] [phone] --  [place]  Pending - No Request Sent N/A [facility] [place], [place] [address] [phone] --  [place]  Pending - No Request Sent N/A [address], [place] [address] [id number] [phone] --  [facility]  Pending - No Request Sent N/A [address] [place] [address] [phone] --  [facility]  Pending - No Request Sent N/A [address] [place] [address] [phone] --  [facility]  Pending - No Request Sent N/A [address], [place] [address] [phone] --   TTS will continue to seek bed placement.  Juan Juarez, MSW, [place], LCAS-A Phone:  [phone] Disposition/TOC

## 2021-02-05 NOTE — Progress Notes (Signed)
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 Carnes MD Electronically signed by Dorris Carnes MD Signature Date/Time: 02/04/2021/3:20:46 PM    Final    [place] EKG SITE RITE  Result Date: 01/30/2021 If Site Rite image not attached, placement could not be confirmed due to current cardiac rhythm.   Kathie Dike, MD  [facility]  If 7PM-7AM, please contact night-coverage [email or web address]  02/05/2021, 1:29 PM   LOS: 6 days

## 2021-02-06 DIAGNOSIS — N179 Acute kidney failure, unspecified: Secondary | ICD-10-CM | POA: Diagnosis not present

## 2021-02-06 DIAGNOSIS — J9601 Acute respiratory failure with hypoxia: Secondary | ICD-10-CM | POA: Diagnosis not present

## 2021-02-06 LAB — GLUCOSE, CAPILLARY
Glucose-Capillary: 104 mg/dL — ABNORMAL HIGH (ref 70–99)
Glucose-Capillary: 101 mg/dL — ABNORMAL HIGH (ref 70–99)
Glucose-Capillary: 156 mg/dL — ABNORMAL HIGH (ref 70–99)
Glucose-Capillary: 172 mg/dL — ABNORMAL HIGH (ref 70–99)

## 2021-02-06 LAB — CREATININE, SERUM
Creatinine, Ser: 1.14 mg/dL (ref 0.61–1.24)
GFR, Estimated: >60 mL/min (ref >60)

## 2021-02-06 MED ORDER — TRAMADOL HCL 50 MG PO TABS
50.0000 mg | ORAL_TABLET | Freq: Once | ORAL | Status: AC
Start: 2021-02-06 — End: 2021-02-06
  Administered 2021-02-06: 50 mg via ORAL
  Filled 2021-02-06: qty 1

## 2021-02-06 NOTE — Evaluation (Addendum)
Physical Therapy Evaluation Patient Details Name: Juan Juarez MRN: [id number] DOB: 1956/03/23 Today's Date: 02/06/2021   History of Present Illness  65 year old male with a history of polysubstance abuse including cocaine, tobacco, and alcohol, diabetes mellitus type 2, hypertension, depression, metastatic prostate cancer, seizure disorder, chronic hepatitis C presenting with unresponsiveness.  Apparently, the patient informed his family that he wanted to kill himself.  The patient was found in a motel unresponsive and hypoxic.  Apparently, there were pill bottles strewn around him when he was found by EMS.  The patient was given Narcan with no improvement.  His CBG was greater than 100 at that time.  Upon arrival to the emergency department, the patient remained obtunded.  He was noted to be hypothermic and hypotensive.  He was intubated.  Patient was started on IV fluids.  The patient was given 5 L of fluid.  He was placed on a warming blanket.  Empiric antibiotics were started.  The patient had empty bottles of Clobazam, sertraline, metformin, and alprazolam around him.   Clinical Impression  Patient agreeable to participating in PT evaluation today. Patient modified independent for bed mobility. Patient required supervision to min guard for transfers and ambulation.  Patient was unsteady without assistive device and required verbal cues for sequencing of steps and placement of hands with RW. Patient ambulated with slow, labored gait without assistive device and was  antalgic over right lower extremity complaining of pain in his right forefoot. He exhibited early toe off in stance phase of gait over right. Patient was more stable ambulating with RW.    Follow Up Recommendations Home health PT    Equipment Recommendations  Rolling walker with 5" wheels    Recommendations for Other Services       Precautions / Restrictions Precautions Precautions: Fall Restrictions Weight Bearing Restrictions:  No      Mobility  Bed Mobility Overal bed mobility: Modified Independent             General bed mobility comments: increased time    Transfers Overall transfer level: Needs assistance Equipment used: Rolling walker (2 wheeled);None Transfers: Sit to/from [employer] to Stand: Min guard Stand pivot transfers: Min guard       General transfer comment: unsteady without assistive device; verbal cues to sequencing of steps and placement of hands  Ambulation/Gait Ambulation/Gait assistance: Min guard Gait Distance (Feet): 75 Feet Assistive device: Rolling walker (2 wheeled);None Gait Pattern/deviations: Step-through pattern;Decreased step length - right;Decreased step length - left;Decreased stance time - right;Decreased stride length;Decreased weight shift to right;Drifts right/left;Antalgic Gait velocity: decreased   General Gait Details: slow, labored gait without assistive device; antalgic over right lower extremity; early toe off in stance phase of gait over right; limited by pain; on room air  Stairs            Wheelchair Mobility    Modified Rankin (Stroke Patients Only)       Balance Overall balance assessment: Needs assistance Sitting-balance support: No upper extremity supported;Feet supported Sitting balance-Leahy Scale: Good     Standing balance support: No upper extremity supported;During functional activity Standing balance-Leahy Scale: Poor Standing balance comment: fair/poor; fair with RW                             Pertinent Vitals/Pain Pain Assessment: 0-10 Pain Score: 10-Worst pain ever Pain Location: right metatarsals Pain Intervention(s): Limited activity within patient's tolerance;Monitored during session;Repositioned  Home Living Family/patient expects to be discharged to:: Private residence Living Arrangements: Alone Available Help at Discharge: Family;Available PRN/intermittently Type of Home:   (Motel) Home Access: Stairs to enter Entrance Stairs-Rails: None Entrance Stairs-Number of Steps: 1 Home Layout: One level Home Equipment: None      Prior Function Level of Independence: Needs assistance   Gait / Transfers Assistance Needed: community ambulator  ADL's / Homemaking Assistance Needed: sister and mother helped do his laundry  Comments: does not have a car; mother and sister take him to store     Hand Dominance        Extremity/Trunk Assessment   Upper Extremity Assessment Upper Extremity Assessment: Overall WFL for tasks assessed    Lower Extremity Assessment Lower Extremity Assessment: Overall WFL for tasks assessed    Cervical / Trunk Assessment Cervical / Trunk Assessment: Normal  Communication   Communication: Expressive difficulties  Cognition Arousal/Alertness: Awake/alert Behavior During Therapy: WFL for tasks assessed/performed Overall Cognitive Status: Within Functional Limits for tasks assessed                                        General Comments      Exercises     Assessment/Plan    PT Assessment Patient needs continued PT services  PT Problem List Decreased balance;Decreased knowledge of use of DME;Decreased knowledge of precautions;Decreased mobility;Decreased activity tolerance;Pain       PT Treatment Interventions DME instruction;Therapeutic activities;Balance training;Cognitive remediation;Gait training;Functional mobility training;Therapeutic exercise;Neuromuscular re-education;Patient/family education    PT Goals (Current goals can be found in the Care Plan section)  Acute Rehab PT Goals Patient Stated Goal: Go home. PT Goal Formulation: With patient Time For Goal Achievement: 02/20/21 Potential to Achieve Goals: Fair    Frequency Min 3X/week   Barriers to discharge           AM-PAC PT "6 Clicks" Mobility  Outcome Measure Help needed turning from your back to your side while in a flat bed  without using bedrails?: None Help needed moving from lying on your back to sitting on the side of a flat bed without using bedrails?: None Help needed moving to and from a bed to a chair (including a wheelchair)?: A Little Help needed standing up from a chair using your arms (e.g., wheelchair or bedside chair)?: A Little Help needed to walk in hospital room?: A Little Help needed climbing 3-5 steps with a railing? : A Lot 6 Click Score: 19    End of Session Equipment Utilized During Treatment: Gait belt Activity Tolerance: Patient limited by pain Patient left: in bed;with call bell/phone within reach Nurse Communication: Mobility status PT Visit Diagnosis: Unsteadiness on feet (R26.81);Other abnormalities of gait and mobility (R26.89)    Time: 1040-1105 PT Time Calculation (min) (ACUTE ONLY): 25 min   Charges:   PT Evaluation $PT Eval Low Complexity: 1 Low PT Treatments $Therapeutic Activity: 8-22 mins        Floria Raveling. Hartnett-Rands, MS, PT Per [place] [phone]  Pamala Hurry  Hartnett-Rands 02/06/2021, 11:25 AM
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
## 2021-02-06 NOTE — Progress Notes (Signed)
Patient meets inpatient criteria per Merlyn Lot, NP.   [facility] recommends pt be referred out at this time as no [facility] beds available.    Patient was referred to the following facilities:  Service Provider Request Status Address Phone Fax Patient Preferred  [facility] [address], [place] [address] [phone] --  [facility] [address], [place] [address] [phone] --  [facility] [address] [place] [address] [phone] --  [facility]  Pending - Request Sent [place], [place] [address] [phone] --  [facility] [address] [place], [place] [address] [phone] --  [place] [address] [place] [address] [phone] --  [facility]  Pending - Request Sent [address], [place] [address] [phone] --  [place] [address], [place] [address] [phone] --  [facility] [address], [place] [address] [phone] --  [facility] [address] [place] [address] [phone] --  [place], [place] [address] [phone] --  [facility] Office  Pending - Request Sent [address]  [address] [phone] --     CSW will continue to monitor for disposition.  Assunta Curtis, MSW, LCSW 02/06/2021 11:48 AM

## 2021-02-06 NOTE — BH Assessment (Addendum)
Disposition :  Upon chart review: "Patient meets inpatient criteria per Merlyn Lot, NP on 02/05/21. AC Linsey recommends pt be referred out at this time as no [facility] beds available".     '@2116'$ , Disposition Counselor followed up with [place], RN, as directed by the Director, Koren Bound, RN, [facility] will not be moving any patients tonight. Any adult or child/adolescent patient needing a bed at [facility] will need to be reviewed in the morning. Morning Disposition LCSW to follow up with patient's disposition at [facility].   Re-faxed referrals '@2105'$  to the following facilities for consideration of bed placement.    Destination Service Provider Request Status Selected Services Address Phone Fax Patient Preferred  [place] [address] [place] [address] [phone] --  [place] [address] [place] [address] [phone] --  [facility]  Pending - Request Sent N/A [address] [place] [address] [phone] --  [facility]  Pending - Request Sent N/A [address] [place] [address] [phone] --  [place] [address] [place], [place] [address] [id number] --  [place] [address] [place] [address] [phone] --  [place] N/A [address], [place] [address] [phone] --  [place], [place] [address] [phone] --  [place] [address] [place] [address] [id number] [phone] --   [place] [address] [place] [address] [phone] --  [place] [address] [place] [address] [id number] --  [facility] Office  Pending - Request Sent N/A [address] [place] [address] [phone] --  [place] N/A [address] [place], [place] [address] [phone] --  [place] N/A [address] [place] [address] [phone] --  [facility]  Pending - Request Sent N/A [address] [place] [address] [phone] --  [facility]  Pending - Request Sent N/A [place], [place] [address] [phone] --  [address] [place] [address] [phone] --  [place] [phone] [address] [place] [address] [place] --  [address] [place] [address] [phone] --  [facility]  Pending - Request Sent N/A [place] [address] [place] [address] [phone] --  [facility]  Pending - Request Sent N/A [address] [place] [address] [phone] --  [place], [place] [address] [phone] --  [facility]  Pending - Request Sent N/A [address], [place] [address] [phone] --  [place] N/A [place] [address] [phone] --  [place] N/A [address], [place] [address] [phone] --  [facility]  Pending - Request Sent N/A [place], [place] [address] [phone] --  [place] Sent N/A [address], [place] [address] [phone] --  [place]  Pending - Request Sent N/A [address], [place] [address] [phone] --  [facility]  Pending - Request Sent N/A [address] [place] [address] [phone] --  [facility]  Pending - Request Sent N/A [address], [place] [phone] --  [place]. [address] [place] [address] [phone] --  [place]  Pending - No Request Sent N/A [place], [place] [address] [phone] --

## 2021-02-06 NOTE — Plan of Care (Signed)
  Problem: Acute Rehab PT Goals(only PT should resolve) Goal: Patient Will Transfer Sit To/From Stand Outcome: Progressing Flowsheets (Taken 02/06/2021 1118) Patient will transfer sit to/from stand: with supervision Goal: Pt Will Transfer Bed To Chair/Chair To Bed Outcome: Progressing Flowsheets (Taken 02/06/2021 1118) Pt will Transfer Bed to Chair/Chair to Bed: with supervision Goal: Pt Will Ambulate Outcome: Progressing Flowsheets (Taken 02/06/2021 1118) Pt will Ambulate:  > 125 feet  with least restrictive assistive device  with supervision Goal: Pt/caregiver will Perform Home Exercise Program Outcome: Progressing Flowsheets (Taken 02/06/2021 1118) Pt/caregiver will Perform Home Exercise Program:  For increased strengthening  For improved balance  With Supervision, verbal cues required/provided   Floria Raveling. Hartnett-Rands, MS, PT Per [place] [phone] 02/06/2021

## 2021-02-07 ENCOUNTER — Inpatient Hospital Stay (HOSPITAL_COMMUNITY): Payer: Medicare Other

## 2021-02-07 DIAGNOSIS — J9601 Acute respiratory failure with hypoxia: Secondary | ICD-10-CM | POA: Diagnosis not present

## 2021-02-07 LAB — CULTURE, BLOOD (ROUTINE X 2)
Culture: NO GROWTH
Culture: NO GROWTH
Special Requests: ADEQUATE

## 2021-02-07 LAB — RESP PANEL BY RT-PCR (FLU A&B, COVID) ARPGX2
Influenza A by PCR: NEGATIVE
Influenza B by PCR: NEGATIVE
SARS Coronavirus 2 by RT PCR: NEGATIVE

## 2021-02-07 LAB — GLUCOSE, CAPILLARY
Glucose-Capillary: 119 mg/dL — ABNORMAL HIGH (ref 70–99)
Glucose-Capillary: 222 mg/dL — ABNORMAL HIGH (ref 70–99)
Glucose-Capillary: 112 mg/dL — ABNORMAL HIGH (ref 70–99)
Glucose-Capillary: 198 mg/dL — ABNORMAL HIGH (ref 70–99)

## 2021-02-07 MED ORDER — HALOPERIDOL LACTATE 5 MG/ML IJ SOLN
INTRAMUSCULAR | Status: AC
  Administered 2021-02-07: 5 mg via INTRAMUSCULAR
  Filled 2021-02-07: qty 1

## 2021-02-07 MED ORDER — QUETIAPINE FUMARATE 100 MG PO TABS: 100.0000 mg | ORAL_TABLET | Freq: Two times a day (BID) | ORAL | 0 refills | Status: DC

## 2021-02-07 MED ORDER — LORAZEPAM 2 MG/ML IJ SOLN
2.0000 mg | INTRAMUSCULAR | Status: DC | PRN
  Administered 2021-02-07 (×2): 2 mg via INTRAVENOUS
  Filled 2021-02-07 (×3): qty 1

## 2021-02-07 MED ORDER — PROSOURCE PLUS PO LIQD
30.0000 mL | Freq: Two times a day (BID) | ORAL | Status: DC
  Administered 2021-02-08 – 2021-02-09 (×3): 30 mL via ORAL
  Filled 2021-02-07 (×3): qty 30

## 2021-02-07 MED ORDER — AMOXICILLIN-POT CLAVULANATE 875-125 MG PO TABS: 1.0000 | ORAL_TABLET | Freq: Two times a day (BID) | ORAL | 0 refills | Status: AC

## 2021-02-07 MED ORDER — HALOPERIDOL LACTATE 5 MG/ML IJ SOLN: 5.0000 mg | Freq: Four times a day (QID) | INTRAMUSCULAR | Status: DC | PRN

## 2021-02-07 NOTE — Progress Notes (Signed)
Around 1615 the patient walked out of his room and stated he was leaving. Security notified and arrived, ativan administered to patient per PRN order and patient assisted back to bed by nursing staff and security. Sitter at bedside, Plan of care updated.

## 2021-02-07 NOTE — Progress Notes (Signed)
During change of shift pt attempted to leave room, stating that he wanted to leave. I educated pt on his IVC status and established what boundaries and rules he needed to follow. Pt demonstrated understanding by repeating. However pt continued to state that he was leaving, that he needed to go clothes shopping, that he needed to get his checks from his mom's house. Pt vocalized loudly that he needed valium, that he did not understand why we are keeping him here and that he is free to do what ever he wants. I attempted to reorient pt to his situation, as well as attempted to redirect him, pt was given a coloring book and crayons. Contacted [place] [facility] for medication intervention as pt has removed iv access.

## 2021-02-07 NOTE — Progress Notes (Signed)
Nutrition Follow up   INTERVENTION:  -Continue low sodium diet   -ProSource Plus 30 ml BID  NUTRITION DIAGNOSIS:   Inadequate oral intake related to inability to eat as evidenced by NPO status. -resolved with diet advancement and meal intake 100%   GOAL:  Provide needs based on ASPEN/SCCM guidelines - Met  MONITOR:  Vent status, Labs, I & O's, Weight trends    ASSESSMENT: Patient is a 65 yo male with a history of DM2, Hep C, Malnutrition, polysubstance abuse and prostate cancer. He was found unresponsive, hypothermic, hypotensive. Acute respiratory failure, toxic encephalopathy, hypophosphatemia/hyponatremia.   Patient extubated and tolerating diet with excellent po intake 100% of meals.   Patient is being [facility] to [facility]. Patient trying to leave the hospital.   Re-estimated nutrition needs. Weight history and medications reviewed.    Intake/Output Summary (Last 24 hours) at 02/07/2021 1305 Last data filed at 02/07/2021 0944 Gross per 24 hour  Intake 680 ml  Output --  Net 680 ml    Labs: BMP Latest Ref Rng & Units 02/06/2021 02/05/2021 02/04/2021  Glucose 70 - 99 mg/dL - 125(H) 88  BUN 8 - 23 mg/dL - 22 21  Creatinine 0.61 - 1.24 mg/dL 1.14 1.32(H) 1.47(H)  BUN/Creat Ratio 10 - 24 - - -  Sodium 135 - 145 mmol/L - 139 137  Potassium 3.5 - 5.1 mmol/L - 3.5 3.4(L)  Chloride 98 - 111 mmol/L - 109 105  CO2 22 - 32 mmol/L - 22 23  Calcium 8.9 - 10.3 mg/dL - 8.1(L) 8.1(L)      NUTRITION - FOCUSED PHYSICAL EXAM: Nutrition-Focused physical exam completed. Findings are no fat depletion, mild temporal, patellar, quadricept muscle depletion, and mild LE edema.    Diet Order:   Diet Order             Diet - low sodium heart healthy           Diet heart healthy/carb modified Room service appropriate? Yes; Fluid consistency: Thin  Diet effective now                   EDUCATION NEEDS:  Not appropriate for education at this time  Skin:  Skin Assessment: Skin  Integrity Issues: Skin Integrity Issues:: DTI DTI: sacrum  Last BM:  8/8  Height:   Ht Readings from Last 1 Encounters:  01/30/21 _0  (1.702 m)    Weight:   Wt Readings from Last 1 Encounters:  02/06/21 85 kg  Admission wt 86.2 kg  Ideal Body Weight:   67 kg  BMI:  Body mass index is 29.35 kg/m.  Estimated Nutritional Needs:   Kcal:  2100-2300  Protein:  110-115  Fluid:  2.1-2.2 liters daily   Colman Cater MS,RD,CSG,LDN Contact: Shea Evans

## 2021-02-07 NOTE — Progress Notes (Signed)
Palliative-   Chart reviewed.  Noted patient is medically stable and waiting for inpatient psych bed.   Palliative will sign off. Please reconsult if further Palliative assistance is needed.   Mariana Kaufman, AGNP-C Palliative Medicine  No charge

## 2021-02-07 NOTE — Discharge Summary (Signed)
Physician Discharge Summary  Bodie Abernethy MWN:[id number] DOB: 02-25-1956 DOA: 01/29/2021  PCP: Glenda Chroman, MD  Admit date: 01/29/2021  Discharge date: 02/07/2021  Admitted From:Home  Disposition:  [facility]  Recommendations for Outpatient Follow-up:  Follow-up with [facility] and then PCP after discharge from [facility] Continue Augmentin for 1 more day to complete course of treatment for aspiration pneumonia Continue on Seroquel as prescribed  Home Health: None  Equipment/Devices: None  Discharge Condition:Stable  CODE STATUS: Full  Diet recommendation: Heart Healthy/Carb Modified  Brief/Interim Summary: 65 year old male with a history of polysubstance abuse including cocaine, tobacco, and alcohol, diabetes mellitus type 2, hypertension, depression, metastatic prostate cancer, seizure disorder, chronic hepatitis C presenting with unresponsiveness.  Apparently, the patient informed his family that he wanted to kill himself.  The patient was found in a motel unresponsive and hypoxic.  Apparently, there were pill bottles strewn around him when he was found by EMS.  The patient was given Narcan with no improvement.  His CBG was greater than 100 at that time.  Upon arrival to the emergency department, the patient remained obtunded.  He was noted to be hypothermic and hypotensive.  He was intubated.  Patient was started on IV fluids.  The patient was given 5 L of fluid.  He was placed on a warming blanket.  Empiric antibiotics were started. The patient had empty bottles of Clobazam, sertraline, metformin, and alprazolam around him.  -He had subsequently been extubated and clinically improved.  His EEG was negative for seizure activity and CT head did not demonstrate any acute findings.  TSH and B12 levels were within normal limits.  He had no signs of infection and UDS was positive for cocaine and benzodiazepines.  He was evaluated by psychiatry with need for inpatient psychiatric placement noted.  On the day  of discharge, he required involuntary commitment as he refused to stay in the hospital.  He will need to complete 1 more day of Augmentin as prescribed for aspiration pneumonia.  He is otherwise in stable condition for discharge.  Discharge Diagnoses:  Principal Problem:   Acute respiratory failure with hypoxia (HCC) Active Problems:   Hyperglycemia due to diabetes mellitus (HCC)   Polysubstance abuse (HCC)   Hypoalbuminemia due to protein-calorie malnutrition (HCC)   Intentional overdose of drug in tablet form ([place])   Hypothermia   Bradycardia   Hyperlipidemia   Suicidal ideation   Acute encephalopathy   AKI (acute kidney injury) ([place])  Principal discharge diagnosis: Acute toxic encephalopathy in the setting of intentional overdose with suicidal ideation.  Discharge Instructions  Discharge Instructions     Diet - low sodium heart healthy   Complete by: As directed    Increase activity slowly   Complete by: As directed    No wound care   Complete by: As directed       Allergies as of 02/07/2021   No Known Allergies      Medication List     STOP taking these medications    oxyCODONE-acetaminophen 5-325 MG tablet Commonly known as: PERCOCET/ROXICET       TAKE these medications    ALPRAZolam 0.5 MG tablet Commonly known as: XANAX Take 0.5 mg by mouth at bedtime as needed.   amoxicillin-clavulanate 875-125 MG tablet Commonly known as: AUGMENTIN Take 1 tablet by mouth every 12 (twelve) hours for 1 day.   blood glucose meter kit and supplies Dispense based on patient and insurance preference. Use up to four times daily as  directed. (FOR ICD-10 E10.9, E11.9).   cloBAZam 10 MG tablet Commonly known as: ONFI clobazam 10 mg tablet  TAKE ONE TABLET BY MOUTH EVERY DAY   doxepin 75 MG capsule Commonly known as: SINEQUAN Take 75 mg by mouth.   Easy Comfort Insulin Syringe 31G X 5/16" 1 ML Misc Generic drug: Insulin Syringe-Needle U-100 USE AS DIRECTED ONCE  DAILY FOR LEVEMIR FLEXTOUCH 100 DAY SUPPLY   gabapentin 300 MG capsule Commonly known as: NEURONTIN Take by mouth.   glipiZIDE 5 MG 24 hr tablet Commonly known as: GLUCOTROL XL Take 5 mg by mouth daily.   Levemir 100 UNIT/ML injection Generic drug: insulin detemir Inject 0.4 mLs (40 Units total) into the skin daily.   metFORMIN 500 MG tablet Commonly known as: GLUCOPHAGE Take 500 mg by mouth 2 (two) times daily.   QUEtiapine 100 MG tablet Commonly known as: SEROQUEL Take 1 tablet (100 mg total) by mouth 2 (two) times daily.   sertraline 100 MG tablet Commonly known as: ZOLOFT Take 100 mg by mouth daily.   simvastatin 10 MG tablet Commonly known as: ZOCOR Take 10 mg by mouth daily.        Follow-up Information     Vyas, Dhruv B, MD. Schedule an appointment as soon as possible for a visit in 2 week(s).   Specialty: Internal Medicine Contact information: [address] [place] [address] [phone]                No Known Allergies  Consultations: Psychiatry   Procedures/Studies: CT Head Wo Contrast  Result Date: 01/30/2021 CLINICAL DATA:  Mental status change, unknown cause. Patient had informed family that he wanted to kill himself. Patient was found in a local motel unresponsive. AMS. EXAM: CT HEAD WITHOUT CONTRAST TECHNIQUE: Contiguous axial images were obtained from the base of the skull through the vertex without intravenous contrast. COMPARISON:  07/16/2020 FINDINGS: Brain: Moderate parenchymal volume loss, asymmetrically more severe involving the right temporal lobe, appears similar to prior examination. Borderline ventriculomegaly, asymmetrically more severe involving the right lateral ventricle likely represents the sequela of central atrophy and resultant ex vacuo dilatation. Interval development of a remote lacunar infarct within the left thalamus. Known pontine infarct is not well appreciated on this examination. No evidence of acute intracranial  hemorrhage or infarct. No abnormal mass effect or midline shift. No abnormal intra or extra-axial mass lesion or fluid collection. Cerebellum is unremarkable. Vascular: No asymmetric hyperdense vasculature at the skull base peer Skull: Burr holes within the right frontal and parietal regions are again noted. No acute calvarial fracture. Sinuses/Orbits: The orbits and paranasal sinuses are unremarkable. Other: Mastoid air cells and middle ear cavities are clear. Rounded except ule subcutaneous mass is partially visualized, similar to that noted on prior MRI examination. IMPRESSION: No acute intracranial abnormality. Relatively advanced senescent changes, asymmetrically more severe involving the right temporal lobe, possibly related to a remote trauma. This appears stable since prior examination. 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 Electronically Signed   By: Fidela Salisbury MD   On: 01/30/2021 02:34   DG CHEST PORT 1 VIEW  Result Date: 02/02/2021 CLINICAL DATA:  Fever altered mental status EXAM: PORTABLE CHEST 1 VIEW COMPARISON:  01/31/2021, 01/29/2021 FINDINGS: Removal of endotracheal tube. Low lung volumes with increased bilateral airspace disease. There is probable vascular calcification. No pneumothorax. IMPRESSION: 1. Interim removal of endotracheal and esophageal tubes. 2. Interim development of vascular congestion and diffuse bilateral airspace opacities which may  be secondary to edema, diffuse pneumonia, or combination of the 2. Electronically Signed   By: Donavan Foil M.D.   On: 02/02/2021 21:17   DG Chest Port 1 View  Result Date: 01/31/2021 CLINICAL DATA:  Respiratory failure.  Intubation. EXAM: PORTABLE CHEST 1 VIEW COMPARISON:  01/29/2021. FINDINGS: Interim advancement of NG tube, its tip and side hole are in the stomach. Endotracheal tube stable position. Heart size stable. Low lung volumes with bibasilar atelectasis again noted. Bibasilar infiltrates cannot be  excluded. No pleural effusion or pneumothorax. IMPRESSION: 1. Interim advancement of NG tube, its tip and side hole are in the stomach. Endotracheal tube in stable position. 2. Low lung volumes with bibasilar atelectasis again noted. Bibasilar infiltrates cannot be excluded. Electronically Signed   By: Marcello Moores  Register   On: 01/31/2021 07:09   DG Chest Port 1 View  Result Date: 01/30/2021 CLINICAL DATA:  Altered mental status. Found unresponsive post intubation. EXAM: PORTABLE CHEST 1 VIEW COMPARISON:  Radiograph 11/15/2020 FINDINGS: Endotracheal tube tip is 3 cm from the carina. Enteric tube is in place. Tip is below the diaphragm in the stomach, side-port in the region of the gastroesophageal junction. Lung volumes are low. Stable heart size and mediastinal contours. Streaky bibasilar atelectasis without confluent airspace disease. No convincing pulmonary edema. No pneumothorax or large pleural effusion. No acute osseous abnormalities are seen. IMPRESSION: 1. Endotracheal tube tip 3 cm from the carina. 2. Enteric tube tip below the diaphragm in the stomach, side-port in the region of the gastroesophageal junction. Recommend advancement of 3-4 cm. 3. Low lung volumes with streaky bibasilar atelectasis. Electronically Signed   By: Keith Rake M.D.   On: 01/30/2021 00:14   EEG adult  Result Date: 01/30/2021 Lora Havens, MD     01/30/2021  4:42 PM Patient Name: Xai Frerking MRN: [id number] Epilepsy Attending: Lora Havens Referring Physician/Provider: Dr Shanon Brow Tat Date: 01/30/2021 Duration: 24.70mins Patient history: 65 year old male with altered mental status.  EEG to evaluate for seizures. Level of alertness: Awake, asleep AEDs during EEG study: None Technical aspects: This EEG study was done with scalp electrodes positioned according to the 10-20 International system of electrode placement. Electrical activity was acquired at a sampling rate of $Remov'500Hz'vrdTen$  and reviewed with a high frequency filter of  $R'70Hz'oG$  and a low frequency filter of $RemoveB'1Hz'topNgKLH$ . EEG data were recorded continuously and digitally stored.  Description:  The posterior dominant rhythm consists of 8-9 Hz activity of moderate voltage (25-35 uV) seen predominantly in posterior head regions, symmetric and reactive to eye opening and eye closing. Sleep was characterized by vertex waves, sleep spindles (12 to 14 Hz), maximal frontocentral region. EEG showed intermittent generalized 3 to 6 Hz theta-delta slowing. Hyperventilation and photic stimulation were not performed.   ABNORMALITY - Intermittent slow, generalized IMPRESSION: This study is suggestive of mild diffuse encephalopathy, nonspecific etiology. No seizures or epileptiform discharges were seen throughout the recording. Lora Havens   ECHOCARDIOGRAM COMPLETE  Result Date: 02/04/2021    ECHOCARDIOGRAM REPORT   Patient Name:   ESTEVON FLUKE Date of Exam: 02/04/2021 Medical Rec #:  [id number]     Height:       67.0 in Accession #:    [id number]    Weight:       193.1 lb Date of Birth:  1956/04/02      BSA:          1.992 m Patient Age:    65 years      BP:  111/61 mmHg Patient Gender: M             HR:           76 bpm. [facility] Procedure: 2D Echo, Cardiac Doppler and Color Doppler Indications:    Congestive Heart Failure I50.9  History:        Patient has prior history of Echocardiogram examinations, most                 recent 01/28/2018. Risk Factors:Current Smoker, Diabetes,                 Hypertension and Dyslipidemia. Polysubstance abuse (HCC),                 Alcoholic cirrhosis of liver without ascites (HCC), Malignant                 neoplasm of prostate (HCC).  Sonographer:    Celesta Gentile RCS Referring Phys: [phone] JEHANZEB MEMON IMPRESSIONS  1. Left ventricular ejection fraction, by estimation, is >75%. The left ventricle has hyperdynamic function. The left ventricle has no regional wall motion abnormalities. There is mild left ventricular hypertrophy. Left  ventricular diastolic parameters were normal.  2. Right ventricular systolic function is normal. The right ventricular size is normal.  3. The mitral valve is normal in structure. Trivial mitral valve regurgitation.  4. The aortic valve is tricuspid. Aortic valve regurgitation is not visualized. Mild aortic valve stenosis.  5. The inferior vena cava is normal in size with greater than 50% respiratory variability, suggesting right atrial pressure of 3 mmHg. FINDINGS  Left Ventricle: Left ventricular ejection fraction, by estimation, is >75%. The left ventricle has hyperdynamic function. The left ventricle has no regional wall motion abnormalities. The left ventricular internal cavity size was normal in size. There is mild left ventricular hypertrophy. Left ventricular diastolic parameters were normal. Right Ventricle: The right ventricular size is normal. Right vetricular wall thickness was not assessed. Right ventricular systolic function is normal. Left Atrium: Left atrial size was normal in size. Right Atrium: Right atrial size was normal in size. Pericardium: There is no evidence of pericardial effusion. Mitral Valve: The mitral valve is normal in structure. Trivial mitral valve regurgitation. Tricuspid Valve: The tricuspid valve is normal in structure. Tricuspid valve regurgitation is trivial. Aortic Valve: The aortic valve is tricuspid. Aortic valve regurgitation is not visualized. Mild aortic stenosis is present. Aortic valve mean gradient measures 10.0 mmHg. Aortic valve peak gradient measures 19.2 mmHg. Aortic valve area, by VTI measures 1.30 cm. Pulmonic Valve: The pulmonic valve was not well visualized. Pulmonic valve regurgitation is not visualized. Aorta: The aortic root is normal in size and structure. Venous: The inferior vena cava is normal in size with greater than 50% respiratory variability, suggesting right atrial pressure of 3 mmHg. IAS/Shunts: No atrial level shunt detected by color flow  Doppler.  LEFT VENTRICLE PLAX 2D LVIDd:         4.20 cm  Diastology LVIDs:         2.00 cm  LV e' medial:    6.85 cm/s LV PW:         1.20 cm  LV E/e' medial:  13.1 LV IVS:        1.10 cm  LV e' lateral:   8.59 cm/s LVOT diam:     1.60 cm  LV E/e' lateral: 10.4 LV SV:         48 LV SV Index:  24 LVOT Area:     2.01 cm  RIGHT VENTRICLE RV S prime:     12.90 cm/s TAPSE (M-mode): 2.0 cm LEFT ATRIUM             Index       RIGHT ATRIUM           Index LA diam:        3.60 cm 1.81 cm/m  RA Area:     12.40 cm LA Vol (A2C):   41.0 ml 20.58 ml/m RA Volume:   26.20 ml  13.15 ml/m LA Vol (A4C):   25.2 ml 12.65 ml/m LA Biplane Vol: 33.0 ml 16.56 ml/m  AORTIC VALVE AV Area (Vmax):    1.21 cm AV Area (Vmean):   1.14 cm AV Area (VTI):     1.30 cm AV Vmax:           219.00 cm/s AV Vmean:          142.000 cm/s AV VTI:            0.372 m AV Peak Grad:      19.2 mmHg AV Mean Grad:      10.0 mmHg LVOT Vmax:         132.00 cm/s LVOT Vmean:        80.500 cm/s LVOT VTI:          0.240 m LVOT/AV VTI ratio: 0.65  AORTA Ao Root diam: 3.30 cm MITRAL VALVE MV Area (PHT): 1.25 cm     SHUNTS MV Decel Time: 609 msec     Systemic VTI:  0.24 m MV E velocity: 89.70 cm/s   Systemic Diam: 1.60 cm MV A velocity: 140.00 cm/s MV E/A ratio:  0.64 Dorris Carnes MD Electronically signed by Dorris Carnes MD Signature Date/Time: 02/04/2021/3:20:46 PM    Final    [place] EKG SITE RITE  Result Date: 01/30/2021 If Site Rite image not attached, placement could not be confirmed due to current cardiac rhythm.    Discharge Exam: Vitals:   02/06/21 1107 02/06/21 2000  BP:    Pulse:    Resp:    Temp: 97.9 F (36.6 C)   SpO2:  95%   Vitals:   02/06/21 0807 02/06/21 0900 02/06/21 1107 02/06/21 2000  BP:  (!) 152/71    Pulse: 68 68    Resp: 18     Temp: 98 F (36.7 C)  97.9 F (36.6 C)   TempSrc: Oral  Oral   SpO2: 96% 96%  95%  Weight:      Height:        General: Pt is alert, awake, not in acute distress Cardiovascular: RRR, S1/S2 +, no  rubs, no gallops Respiratory: CTA bilaterally, no wheezing, no rhonchi Abdominal: Soft, NT, ND, bowel sounds + Extremities: no edema, no cyanosis    The results of significant diagnostics from this hospitalization (including imaging, microbiology, ancillary and laboratory) are listed below for reference.     Microbiology: Recent Results (from the past 240 hour(s))  Urine Culture     Status: None   Collection Time: 01/30/21 12:09 AM   Specimen: Urine, Catheterized  Result Value Ref Range Status   Specimen Description   Final    URINE, CATHETERIZED Performed at [facility], [address] [place], [place] [address]    Special Requests   Final    NONE Performed at [facility], [address], [place], [place] [address]    Culture   Final  NO GROWTH Performed at [facility], [place] [address] [place], [place] [address]    Report Status 01/31/2021 FINAL  Final  Resp Panel by RT-PCR (Flu A&B, Covid)     Status: None   Collection Time: 01/30/21 12:41 AM   Specimen: Nasopharyngeal(NP) swabs in vial transport medium  Result Value Ref Range Status   SARS Coronavirus 2 by RT PCR NEGATIVE NEGATIVE Final    Comment: (NOTE) SARS-CoV-2 target nucleic acids are NOT DETECTED.  The SARS-CoV-2 RNA is generally detectable in upper respiratory specimens during the acute phase of infection. The lowest concentration of SARS-CoV-2 viral copies this assay can detect is 138 copies/mL. A negative result does not preclude SARS-Cov-2 infection and should not be used as the sole basis for treatment or other patient management decisions. A negative result may occur with  improper specimen collection/handling, submission of specimen other than nasopharyngeal swab, presence of viral mutation(s) within the areas targeted by this assay, and inadequate number of viral copies(<138 copies/mL). A negative result must be combined with clinical observations, patient history, and  epidemiological information. The expected result is Negative.  Fact Sheet for Patients:  EntrepreneurPulse.com.au  Fact Sheet for Healthcare Providers:  IncredibleEmployment.be  This test is no t yet approved or cleared by the [place] FDA and  has been authorized for detection and/or diagnosis of SARS-CoV-2 by FDA under an Emergency Use Authorization (EUA). This EUA will remain  in effect (meaning this test can be used) for the duration of the COVID-19 declaration under Section 564(b)(1) of the Act, 21 U.S.C.section 360bbb-3(b)(1), unless the authorization is terminated  or revoked sooner.       Influenza A by PCR NEGATIVE NEGATIVE Final   Influenza B by PCR NEGATIVE NEGATIVE Final    Comment: (NOTE) The Xpert Xpress SARS-CoV-2/FLU/RSV plus assay is intended as an aid in the diagnosis of influenza from Nasopharyngeal swab specimens and should not be used as a sole basis for treatment. Nasal washings and aspirates are unacceptable for Xpert Xpress SARS-CoV-2/FLU/RSV testing.  Fact Sheet for Patients: EntrepreneurPulse.com.au  Fact Sheet for Healthcare Providers: IncredibleEmployment.be  This test is not yet approved or cleared by the [place] FDA and has been authorized for detection and/or diagnosis of SARS-CoV-2 by FDA under an Emergency Use Authorization (EUA). This EUA will remain in effect (meaning this test can be used) for the duration of the COVID-19 declaration under Section 564(b)(1) of the Act, 21 U.S.C. section 360bbb-3(b)(1), unless the authorization is terminated or revoked.  Performed at [facility], [address] [place], [place] [address]   MRSA Next Gen by PCR, Nasal     Status: None   Collection Time: 01/30/21  3:54 AM   Specimen: Nasal Mucosa; Nasal Swab  Result Value Ref Range Status   MRSA by PCR Next Gen NOT DETECTED NOT DETECTED Final    Comment: (NOTE) The  GeneXpert MRSA Assay (FDA approved for NASAL specimens only), is one component of a comprehensive MRSA colonization surveillance program. It is not intended to diagnose MRSA infection nor to guide or monitor treatment for MRSA infections. Test performance is not FDA approved in patients less than 71 years old. Performed at [facility], [address], [place], [place] [address]   Culture, blood (Routine X 2) w Reflex to ID Panel     Status: None   Collection Time: 01/30/21  2:00 PM   Specimen: BLOOD RIGHT HAND  Result Value Ref Range Status   Specimen Description BLOOD RIGHT  HAND  Final   Special Requests   Final    Blood Culture adequate volume BOTTLES DRAWN AEROBIC AND ANAEROBIC   Culture   Final    NO GROWTH 5 DAYS Performed at [facility], [address] [place], [place] [address]    Report Status 02/04/2021 FINAL  Final  Culture, blood (Routine X 2) w Reflex to ID Panel     Status: None   Collection Time: 01/30/21  2:00 PM   Specimen: BLOOD LEFT HAND  Result Value Ref Range Status   Specimen Description BLOOD LEFT HAND  Final   Special Requests   Final    Blood Culture adequate volume BOTTLES DRAWN AEROBIC AND ANAEROBIC   Culture   Final    NO GROWTH 5 DAYS Performed at [facility], [address], [place], [place] [address]    Report Status 02/04/2021 FINAL  Final  Culture, blood (x 2)     Status: None (Preliminary result)   Collection Time: 02/02/21  9:28 PM   Specimen: BLOOD  Result Value Ref Range Status   Specimen Description BLOOD LEFT ANTECUBITAL  Final   Special Requests   Final    Blood Culture adequate volume BOTTLES DRAWN AEROBIC AND ANAEROBIC   Culture   Final    NO GROWTH 3 DAYS Performed at [facility], [address] [place], [place] [address]    Report Status PENDING  Incomplete  Culture, blood (x 2)     Status: None (Preliminary result)   Collection Time: 02/02/21  9:28 PM   Specimen: BLOOD  Result Value Ref Range Status   Specimen Description  BLOOD  Final   Special Requests NONE  Final   Culture   Final    NO GROWTH 3 DAYS Performed at [facility], [address], [place],  [address]    Report Status PENDING  Incomplete     Labs: BNP (last 3 results) Recent Labs    02/02/21 0335 02/03/21 0345  BNP 721.0* 264.1*   Basic Metabolic Panel: Recent Labs  Lab 02/01/21 0447 02/02/21 0335 02/03/21 1336 02/04/21 0453 02/05/21 0502 02/06/21 0439  NA 141 140 136 137 139  --   K 4.3 3.9 3.5 3.4* 3.5  --   CL 114* 109 107 105 109  --   CO2 18* 22 20* 23 22  --   GLUCOSE 149* 174* 125* 88 125*  --   BUN $Re'21 19 18 21 22  'hxP$ --   CREATININE 1.25* 1.22 1.40* 1.47* 1.32* 1.14  CALCIUM 8.4* 8.4* 7.9* 8.1* 8.1*  --   PHOS  --  2.7  --  3.7  --   --    Liver Function Tests: Recent Labs  Lab 02/02/21 0335 02/04/21 0453  ALBUMIN 3.0* 2.6*   No results for input(s): LIPASE, AMYLASE in the last 168 hours. No results for input(s): AMMONIA in the last 168 hours. CBC: Recent Labs  Lab 02/01/21 0447 02/02/21 2128 02/04/21 0453  WBC 8.5 9.8 8.8  NEUTROABS  --  8.0*  --   HGB 12.5* 11.3* 13.0  HCT 37.9* 32.9* 38.0*  MCV 96.2 93.2 92.9  PLT 113* 127* 149*   Cardiac Enzymes: No results for input(s): CKTOTAL, CKMB, CKMBINDEX, TROPONINI in the last 168 hours. BNP: Invalid input(s): POCBNP CBG: Recent Labs  Lab 02/06/21 0809 02/06/21 1106 02/06/21 1652 02/06/21 2104 02/07/21 0804  GLUCAP 104* 101* 156* 172* 119*   D-Dimer No results for input(s): DDIMER in the last 72 hours.  Hgb A1c No results for input(s): HGBA1C in the last 72 hours. Lipid Profile No results for input(s): CHOL, HDL, LDLCALC, TRIG, CHOLHDL, LDLDIRECT in the last 72 hours. Thyroid function studies No results for input(s): TSH, T4TOTAL, T3FREE, THYROIDAB in the last 72 hours.  Invalid input(s): FREET3 Anemia work up No results for input(s): VITAMINB12, FOLATE, FERRITIN, TIBC, IRON, RETICCTPCT in the last 72 hours. Urinalysis    Component  Value Date/Time   COLORURINE YELLOW 02/02/2021 2053   APPEARANCEUR CLEAR 02/02/2021 2053   APPEARANCEUR Clear 08/29/2020 1313   LABSPEC 1.012 02/02/2021 2053   PHURINE 6.0 02/02/2021 2053   GLUCOSEU NEGATIVE 02/02/2021 2053   HGBUR MODERATE (A) 02/02/2021 2053   BILIRUBINUR NEGATIVE 02/02/2021 2053   BILIRUBINUR Negative 08/29/2020 1313   KETONESUR NEGATIVE 02/02/2021 2053   PROTEINUR >=300 (A) 02/02/2021 2053   UROBILINOGEN 0.2 07/01/2014 2244   NITRITE NEGATIVE 02/02/2021 2053   LEUKOCYTESUR NEGATIVE 02/02/2021 2053   Sepsis Labs Invalid input(s): PROCALCITONIN,  WBC,  LACTICIDVEN Microbiology Recent Results (from the past 240 hour(s))  Urine Culture     Status: None   Collection Time: 01/30/21 12:09 AM   Specimen: Urine, Catheterized  Result Value Ref Range Status   Specimen Description   Final    URINE, CATHETERIZED Performed at [facility], [address], [place], [place] [address]    Special Requests   Final    NONE Performed at [facility], [address] [place], [place] [address]    Culture   Final    NO GROWTH Performed at [facility], [place] [address] [place], [place] [address]    Report Status 01/31/2021 FINAL  Final  Resp Panel by RT-PCR (Flu A&B, Covid)     Status: None   Collection Time: 01/30/21 12:41 AM   Specimen: Nasopharyngeal(NP) swabs in vial transport medium  Result Value Ref Range Status   SARS Coronavirus 2 by RT PCR NEGATIVE NEGATIVE Final    Comment: (NOTE) SARS-CoV-2 target nucleic acids are NOT DETECTED.  The SARS-CoV-2 RNA is generally detectable in upper respiratory specimens during the acute phase of infection. The lowest concentration of SARS-CoV-2 viral copies this assay can detect is 138 copies/mL. A negative result does not preclude SARS-Cov-2 infection and should not be used as the sole basis for treatment or other patient management decisions. A negative result may occur with  improper specimen collection/handling,  submission of specimen other than nasopharyngeal swab, presence of viral mutation(s) within the areas targeted by this assay, and inadequate number of viral copies(<138 copies/mL). A negative result must be combined with clinical observations, patient history, and epidemiological information. The expected result is Negative.  Fact Sheet for Patients:  EntrepreneurPulse.com.au  Fact Sheet for Healthcare Providers:  IncredibleEmployment.be  This test is no t yet approved or cleared by the [place] FDA and  has been authorized for detection and/or diagnosis of SARS-CoV-2 by FDA under an Emergency Use Authorization (EUA). This EUA will remain  in effect (meaning this test can be used) for the duration of the COVID-19 declaration under Section 564(b)(1) of the Act, 21 U.S.C.section 360bbb-3(b)(1), unless the authorization is terminated  or revoked sooner.       Influenza A by PCR NEGATIVE NEGATIVE Final   Influenza B by PCR NEGATIVE NEGATIVE Final    Comment: (NOTE) The Xpert Xpress SARS-CoV-2/FLU/RSV plus assay is intended as an aid in the diagnosis of influenza from Nasopharyngeal swab specimens and should not be used as a sole basis for  treatment. Nasal washings and aspirates are unacceptable for Xpert Xpress SARS-CoV-2/FLU/RSV testing.  Fact Sheet for Patients: EntrepreneurPulse.com.au  Fact Sheet for Healthcare Providers: IncredibleEmployment.be  This test is not yet approved or cleared by the [place] FDA and has been authorized for detection and/or diagnosis of SARS-CoV-2 by FDA under an Emergency Use Authorization (EUA). This EUA will remain in effect (meaning this test can be used) for the duration of the COVID-19 declaration under Section 564(b)(1) of the Act, 21 U.S.C. section 360bbb-3(b)(1), unless the authorization is terminated or revoked.  Performed at [facility], [address], [place], [place] [address]   MRSA Next Gen by PCR, Nasal     Status: None   Collection Time: 01/30/21  3:54 AM   Specimen: Nasal Mucosa; Nasal Swab  Result Value Ref Range Status   MRSA by PCR Next Gen NOT DETECTED NOT DETECTED Final    Comment: (NOTE) The GeneXpert MRSA Assay (FDA approved for NASAL specimens only), is one component of a comprehensive MRSA colonization surveillance program. It is not intended to diagnose MRSA infection nor to guide or monitor treatment for MRSA infections. Test performance is not FDA approved in patients less than 50 years old. Performed at [facility], [address], [place], [place] [address]   Culture, blood (Routine X 2) w Reflex to ID Panel     Status: None   Collection Time: 01/30/21  2:00 PM   Specimen: BLOOD RIGHT HAND  Result Value Ref Range Status   Specimen Description BLOOD RIGHT HAND  Final   Special Requests   Final    Blood Culture adequate volume BOTTLES DRAWN AEROBIC AND ANAEROBIC   Culture   Final    NO GROWTH 5 DAYS Performed at [facility], [address], [place], [place] [address]    Report Status 02/04/2021 FINAL  Final  Culture, blood (Routine X 2) w Reflex to ID Panel     Status: None   Collection Time: 01/30/21  2:00 PM   Specimen: BLOOD LEFT HAND  Result Value Ref Range Status   Specimen Description BLOOD LEFT HAND  Final   Special Requests   Final    Blood Culture adequate volume BOTTLES DRAWN AEROBIC AND ANAEROBIC   Culture   Final    NO GROWTH 5 DAYS Performed at [facility] [address], [place], [place] [address]    Report Status 02/04/2021 FINAL  Final  Culture, blood (x 2)     Status: None (Preliminary result)   Collection Time: 02/02/21  9:28 PM   Specimen: BLOOD  Result Value Ref Range Status   Specimen Description BLOOD LEFT ANTECUBITAL  Final   Special Requests   Final    Blood Culture adequate volume BOTTLES DRAWN AEROBIC AND ANAEROBIC   Culture   Final    NO GROWTH 3 DAYS Performed at [facility], [address], [place], [place] [address]    Report Status PENDING  Incomplete  Culture, blood (x 2)     Status: None (Preliminary result)   Collection Time: 02/02/21  9:28 PM   Specimen: BLOOD  Result Value Ref Range Status   Specimen Description BLOOD  Final   Special Requests NONE  Final   Culture   Final    NO GROWTH 3 DAYS Performed at [facility], [address] [place], [place] [address]    Report Status PENDING  Incomplete     Time coordinating discharge: 35 minutes  SIGNED:   Derek Laughter D  Manuella Ghazi, DO [facility] 02/07/2021, 10:47 AM  If 7PM-7AM, please contact night-coverage [email or web address]

## 2021-02-07 NOTE — Progress Notes (Signed)
Pt accepted to [facility] 406-2     Patient meets inpatient criteria per Merlyn Lot, NP   Dr.Greg Mallie Darting is the attending provider.    Call report to [id number]    Boneta Lucks, RN @ [facility] ED notified.     Pt scheduled  to arrive at [facility] today by 1400.   Mariea Clonts, MSW, LCSW-A  10:30 AM 02/07/2021

## 2021-02-07 NOTE — Progress Notes (Signed)
Patient information has been sent to [facility] via secure chat to review for potential admission. Patient meets inpatient criteria per Merlyn Lot, NP .   Situation ongoing, CSW will continue to monitor progress.    Signed:  Mariea Clonts, MSW, LCSW-A  02/07/2021 9:53 AM

## 2021-02-07 NOTE — Progress Notes (Signed)
At approximately 0945am the patient began stating the he needed to leave to got to his mothers residence and collect his keys, wallet and cigarettes because he needed to pay his rent that was due on the 3rd. Patient was educated and then stated he needed to go to his mother's house to smoke a cigarette and would be back. Patient was educated on hospital policy and provider notified of patient request and behavior.  The patient then agreed to only walk the unit with RN Purcell Nails). The patient then went through the stairwell door and was stopped at the stairs by staff, refusing to come back into unit. Security was notified and came to unit. This RN guided the patient back on to the unit and he stated he would wait for the provider outside of his room but refused to enter assigned room. Patient stood in hallway and demanded to be allowed to leave to walk to his mother's house for a cigarette and repeatedly stated "I pay taxes, you do what I say" Provider was notified.  At about [phone] the provider, Dr. Manuella Ghazi, came to the bedside and discussed plan of care with patient regarding voluntary versus IVC with admission to [facility] and patient stated he was agreeable to [facility], but that needs to leave and walk to his mothers to get his cigarettes and other belongings and would "come back later", refused alternate nicotine options offered by provider.  The provider stated that he would initiate the IVC paperwork at this time and to administer IV ativan as ordered. IV ativan administered at 1024 and patient was assisted back to bed by security and staff. Plan of care updated

## 2021-02-07 NOTE — Progress Notes (Signed)
Patient has been faxed out due to [facility] being under COVID restrictions. Patient meets inpatient criteria per [facility]. Patient referred to the following facilities:  [facility]  [address], [place] [address] [phone] [facility]  [address] [place] [address] [phone]  [facility]  [address], [place] [address] [phone]  [facility]  [place], [place] [address] [phone]  [facility]  [address] [place], [place] [address] [phone] [facility]  [address] [place] [address] [phone]  [facility]  [address] [place] [address] [place]  [address], [place] [address] [phone] [place]  [address], [place] [address] [phone] [facility]  [address] [place] [address] [phone] [facility]  [address], [place] [address] [id number]  [facility]  [address] [place] [address] [phone] [place]  [place], [place] [address] [phone] [place] [place] [address] [phone]  [facility]  [address] [place] [address] [phone]  [facility]  [place], [place] [address] [place]  [facility] [address] [place] [address] [phone]  [facility]  [address] [place] [address] [place]  [facility]  [address] [place] [address] [phone]  [place] [address], [place] [address] [id number]  [facility]  [address] [place] [address] [phone] [facility]  [address] [place] [address] [phone] [facility]  [address], [place] [address] [phone] [facility]  [address] [place] [address] [id number]  [facility]  [address], [place] [address] [phone]  [facility]  [address] [place] [address] [id number] [phone]  [facility]  [address] [place] [address] [phone] [place]  [address] [place] [address] [phone] [place]  [address] [place] [address] [phone]  [place] [id number] [phone]  [facility]  [address] [place] [address] [phone] [place]  [place], [place] [address] [id number] [phone]    CSW will continue to monitor disposition.    Mariea Clonts, MSW, LCSW-A  2:45 PM 02/07/2021

## 2021-02-08 LAB — GLUCOSE, CAPILLARY
Glucose-Capillary: 120 mg/dL — ABNORMAL HIGH (ref 70–99)
Glucose-Capillary: 168 mg/dL — ABNORMAL HIGH (ref 70–99)
Glucose-Capillary: 188 mg/dL — ABNORMAL HIGH (ref 70–99)
Glucose-Capillary: 120 mg/dL — ABNORMAL HIGH (ref 70–99)

## 2021-02-08 MED ORDER — ACETAMINOPHEN 325 MG PO TABS
650.0000 mg | ORAL_TABLET | Freq: Four times a day (QID) | ORAL | Status: DC | PRN
  Filled 2021-02-08: qty 2

## 2021-02-08 NOTE — Progress Notes (Signed)
Patient seen and evaluated today.  He appears to be resting calmly after having received Haldol earlier this morning.  He continues to have periods of intermittent agitation requiring sedatives.  Please refer to discharge summary dictated 8/9 for full details.  Plan will be to [facility] to [facility] once bed becomes available.  Total care time: 15 minutes.

## 2021-02-08 NOTE — Plan of Care (Signed)
  Problem: Education: Goal: Knowledge of General Education information will improve Description: Including pain rating scale, medication(s)/side effects and non-pharmacologic comfort measures Outcome: Not Progressing   Problem: Health Behavior/Discharge Planning: Goal: Ability to manage health-related needs will improve Outcome: Not Progressing   Problem: Clinical Measurements: Goal: Ability to maintain clinical measurements within normal limits will improve Outcome: Progressing Goal: Will remain free from infection Outcome: Progressing Goal: Diagnostic test results will improve Outcome: Progressing Goal: Cardiovascular complication will be avoided Outcome: Progressing   Problem: Activity: Goal: Risk for activity intolerance will decrease Outcome: Progressing   Problem: Nutrition: Goal: Adequate nutrition will be maintained Outcome: Progressing   Problem: Coping: Goal: Level of anxiety will decrease Outcome: Not Progressing   Problem: Elimination: Goal: Will not experience complications related to bowel motility Outcome: Progressing Goal: Will not experience complications related to urinary retention Outcome: Progressing   Problem: Pain Managment: Goal: General experience of comfort will improve Outcome: Progressing   Problem: Safety: Goal: Ability to remain free from injury will improve Outcome: Not Progressing   Problem: Skin Integrity: Goal: Risk for impaired skin integrity will decrease Outcome: Progressing

## 2021-02-08 NOTE — Progress Notes (Signed)
Patient has been faxed out due to [facility] continuing to be on COVID restrictions. Patient meets inpatient criteria per [facility]. Patient referred to the following facilities:  [facility]  [address], [place] [address] [phone] [facility]  [address] [place] [address] [phone]  [facility]  [address] [place] [address] [phone]  [facility]  [place], [place] [address] [phone]  [facility]  [address] [place], [place] [address] [phone] [facility]  [address], [place] [address] [phone]  [facility]  [address], [place] [address] [place]  [address], [place] [address] [phone] [place]  [address] [place] [address] [phone] [facility]  [address] [place] [address] [phone] [facility]  [address], [place] [address] [id number]  [facility]  [address] [place] [address] [phone] [place]  [place], [place] [address] [phone] [place] [address] [place] [address] [phone]  [facility]  [address] [place] [address] [phone]  [facility]  [place], [place] [address] [place]  [facility] [address] [place] [address] [phone]  [facility]  [place] [place] [address] [place]  [facility]  [address] [place] [address] [phone]  [place] [address] [place] [address] [id number]  [facility]  [address], [place] [address] [phone] [facility]  [address] [place] [address] [phone] [facility]  [address], [place] [address] [phone] [facility]  [address] [place] [address] [id number]  [facility]  [address], [place] [address] [phone]  [facility]  [address] [place] [address] [id number] [phone]  [facility]  [address] [place] [address] [phone] [place]  [address], [place] [address] [phone] [place]  [address] [place] [address] [phone]  [place] [id number] [phone]  [facility]  [address] [place] [address] [phone] [place]  [place], [place] [address] [id number] [phone]    CSW will continue to monitor disposition.    Mariea Clonts, MSW, LCSW-A  12:56 PM 02/08/2021

## 2021-02-08 NOTE — Progress Notes (Signed)
Pt c/o gout pain to R great toe.  Tylenol suppository ordered on MAR, but patient able to take PO medications.  Dr. Myna Hidalgo notified and tylenol switched.  Patient sleeping when I went to give medication.

## 2021-02-08 NOTE — BH Assessment (Addendum)
Disposition:   Patient meets inpatient treatment per Merlyn Lot. Accepted to [facility], Room 406-2 on 02/07/21. However, bed was rescinded due to COVID restrictions.   '@1257'$ ,  Pt faxed out to hospitals for consideration of bed placement.  '@1622'$ , Followed up with [facility] Claiborne Billings, RN), requesting patient to be reviewed for a [facility] admission.   '@2133'$ , Followed up with [facility] Irine Seal, RN), requesting patient to be reviewed for a [facility] admission.   @ 2158,  Pt re-faxed out to hospitals for consideration of bed placement.

## 2021-02-09 ENCOUNTER — Inpatient Hospital Stay (HOSPITAL_COMMUNITY)
Admission: AD | Admit: 2021-02-09 | Discharge: 2021-02-24 | DRG: 885 | Disposition: A | Discharge status: Home / Self Care | Payer: Medicare Other | Source: Intra-hospital | Attending: Psychiatry | Admitting: Psychiatry

## 2021-02-09 DIAGNOSIS — F17210 Nicotine dependence, cigarettes, uncomplicated: Secondary | ICD-10-CM | POA: Diagnosis present

## 2021-02-09 DIAGNOSIS — G3184 Mild cognitive impairment, so stated: Secondary | ICD-10-CM

## 2021-02-09 DIAGNOSIS — C61 Malignant neoplasm of prostate: Secondary | ICD-10-CM | POA: Diagnosis not present

## 2021-02-09 DIAGNOSIS — J690 Pneumonitis due to inhalation of food and vomit: Secondary | ICD-10-CM | POA: Diagnosis not present

## 2021-02-09 DIAGNOSIS — G40909 Epilepsy, unspecified, not intractable, without status epilepticus: Secondary | ICD-10-CM | POA: Diagnosis present

## 2021-02-09 DIAGNOSIS — I5031 Acute diastolic (congestive) heart failure: Secondary | ICD-10-CM | POA: Diagnosis not present

## 2021-02-09 DIAGNOSIS — C799 Secondary malignant neoplasm of unspecified site: Secondary | ICD-10-CM | POA: Diagnosis present

## 2021-02-09 DIAGNOSIS — I10 Essential (primary) hypertension: Secondary | ICD-10-CM | POA: Diagnosis present

## 2021-02-09 DIAGNOSIS — E440 Moderate protein-calorie malnutrition: Secondary | ICD-10-CM | POA: Diagnosis not present

## 2021-02-09 DIAGNOSIS — L89329 Pressure ulcer of left buttock, unspecified stage: Secondary | ICD-10-CM | POA: Diagnosis present

## 2021-02-09 DIAGNOSIS — Z8349 Family history of other endocrine, nutritional and metabolic diseases: Secondary | ICD-10-CM | POA: Diagnosis not present

## 2021-02-09 DIAGNOSIS — Z20822 Contact with and (suspected) exposure to covid-19: Secondary | ICD-10-CM | POA: Diagnosis not present

## 2021-02-09 DIAGNOSIS — R6889 Other general symptoms and signs: Secondary | ICD-10-CM | POA: Diagnosis not present

## 2021-02-09 DIAGNOSIS — F1410 Cocaine abuse, uncomplicated: Secondary | ICD-10-CM | POA: Diagnosis present

## 2021-02-09 DIAGNOSIS — R079 Chest pain, unspecified: Secondary | ICD-10-CM | POA: Diagnosis not present

## 2021-02-09 DIAGNOSIS — T424X2A Poisoning by benzodiazepines, intentional self-harm, initial encounter: Secondary | ICD-10-CM | POA: Diagnosis not present

## 2021-02-09 DIAGNOSIS — Z9151 Personal history of suicidal behavior: Secondary | ICD-10-CM | POA: Diagnosis not present

## 2021-02-09 DIAGNOSIS — R059 Cough, unspecified: Secondary | ICD-10-CM | POA: Diagnosis not present

## 2021-02-09 DIAGNOSIS — R262 Difficulty in walking, not elsewhere classified: Secondary | ICD-10-CM | POA: Diagnosis present

## 2021-02-09 DIAGNOSIS — Z743 Need for continuous supervision: Secondary | ICD-10-CM | POA: Diagnosis not present

## 2021-02-09 DIAGNOSIS — N179 Acute kidney failure, unspecified: Secondary | ICD-10-CM | POA: Diagnosis not present

## 2021-02-09 DIAGNOSIS — G928 Other toxic encephalopathy: Secondary | ICD-10-CM | POA: Diagnosis not present

## 2021-02-09 DIAGNOSIS — R451 Restlessness and agitation: Secondary | ICD-10-CM | POA: Diagnosis present

## 2021-02-09 DIAGNOSIS — Z87820 Personal history of traumatic brain injury: Secondary | ICD-10-CM

## 2021-02-09 DIAGNOSIS — J9601 Acute respiratory failure with hypoxia: Secondary | ICD-10-CM | POA: Diagnosis not present

## 2021-02-09 DIAGNOSIS — R0789 Other chest pain: Secondary | ICD-10-CM | POA: Diagnosis not present

## 2021-02-09 DIAGNOSIS — E1165 Type 2 diabetes mellitus with hyperglycemia: Secondary | ICD-10-CM | POA: Diagnosis present

## 2021-02-09 DIAGNOSIS — R45851 Suicidal ideations: Secondary | ICD-10-CM | POA: Diagnosis present

## 2021-02-09 DIAGNOSIS — F332 Major depressive disorder, recurrent severe without psychotic features: Secondary | ICD-10-CM | POA: Diagnosis not present

## 2021-02-09 DIAGNOSIS — Z794 Long term (current) use of insulin: Secondary | ICD-10-CM | POA: Diagnosis not present

## 2021-02-09 DIAGNOSIS — F319 Bipolar disorder, unspecified: Secondary | ICD-10-CM | POA: Diagnosis present

## 2021-02-09 DIAGNOSIS — Z79899 Other long term (current) drug therapy: Secondary | ICD-10-CM

## 2021-02-09 DIAGNOSIS — F419 Anxiety disorder, unspecified: Secondary | ICD-10-CM | POA: Diagnosis present

## 2021-02-09 DIAGNOSIS — D649 Anemia, unspecified: Secondary | ICD-10-CM | POA: Diagnosis not present

## 2021-02-09 DIAGNOSIS — F1994 Other psychoactive substance use, unspecified with psychoactive substance-induced mood disorder: Secondary | ICD-10-CM | POA: Diagnosis not present

## 2021-02-09 DIAGNOSIS — F329 Major depressive disorder, single episode, unspecified: Secondary | ICD-10-CM | POA: Diagnosis present

## 2021-02-09 LAB — GLUCOSE, CAPILLARY
Glucose-Capillary: 129 mg/dL — ABNORMAL HIGH (ref 70–99)
Glucose-Capillary: 157 mg/dL — ABNORMAL HIGH (ref 70–99)
Glucose-Capillary: 129 mg/dL — ABNORMAL HIGH (ref 70–99)

## 2021-02-09 MED ORDER — LORAZEPAM 1 MG PO TABS
1.0000 mg | ORAL_TABLET | Freq: Two times a day (BID) | ORAL | Status: DC | PRN
  Filled 2021-02-09: qty 1

## 2021-02-09 MED ORDER — INSULIN ASPART 100 UNIT/ML IJ SOLN: 0.0000 [IU] | Freq: Every day | INTRAMUSCULAR | Status: DC

## 2021-02-09 MED ORDER — ALUM & MAG HYDROXIDE-SIMETH 200-200-20 MG/5ML PO SUSP: 30.0000 mL | ORAL | Status: DC | PRN

## 2021-02-09 MED ORDER — QUETIAPINE FUMARATE 100 MG PO TABS
100.0000 mg | ORAL_TABLET | Freq: Two times a day (BID) | ORAL | Status: DC
  Administered 2021-02-09: 100 mg via ORAL
  Filled 2021-02-09 (×5): qty 1

## 2021-02-09 MED ORDER — CLOBAZAM 10 MG PO TABS: 10.0000 mg | ORAL_TABLET | Freq: Every day | ORAL | Status: DC

## 2021-02-09 MED ORDER — HALOPERIDOL 5 MG PO TABS
5.0000 mg | ORAL_TABLET | Freq: Two times a day (BID) | ORAL | Status: DC
  Administered 2021-02-09: 5 mg via ORAL
  Filled 2021-02-09: qty 1

## 2021-02-09 MED ORDER — PANTOPRAZOLE SODIUM 40 MG PO TBEC
40.0000 mg | DELAYED_RELEASE_TABLET | Freq: Every day | ORAL | Status: DC
  Administered 2021-02-11 – 2021-02-24 (×14): 40 mg via ORAL
  Filled 2021-02-09: qty 7
  Filled 2021-02-09 (×19): qty 1

## 2021-02-09 MED ORDER — INSULIN ASPART 100 UNIT/ML IJ SOLN
0.0000 [IU] | Freq: Three times a day (TID) | INTRAMUSCULAR | Status: DC
  Administered 2021-02-10 – 2021-02-11 (×4): 2 [IU] via SUBCUTANEOUS
  Administered 2021-02-12 (×3): 3 [IU] via SUBCUTANEOUS
  Administered 2021-02-13 (×2): 2 [IU] via SUBCUTANEOUS

## 2021-02-09 MED ORDER — CLOBAZAM 10 MG PO TABS
10.0000 mg | ORAL_TABLET | Freq: Every day | ORAL | Status: DC
  Administered 2021-02-09 – 2021-02-23 (×15): 10 mg via ORAL
  Filled 2021-02-09 (×15): qty 1

## 2021-02-09 MED ORDER — HYDROXYZINE HCL 25 MG PO TABS
25.0000 mg | ORAL_TABLET | Freq: Three times a day (TID) | ORAL | Status: DC | PRN
  Administered 2021-02-15 – 2021-02-23 (×4): 25 mg via ORAL
  Filled 2021-02-09: qty 10
  Filled 2021-02-09 (×2): qty 1
  Filled 2021-02-09: qty 10
  Filled 2021-02-09 (×5): qty 1

## 2021-02-09 MED ORDER — IBUPROFEN 800 MG PO TABS
800.0000 mg | ORAL_TABLET | Freq: Three times a day (TID) | ORAL | Status: AC
  Administered 2021-02-09 – 2021-02-10 (×3): 800 mg via ORAL
  Filled 2021-02-09 (×6): qty 1

## 2021-02-09 MED ORDER — ACETAMINOPHEN 325 MG PO TABS
650.0000 mg | ORAL_TABLET | Freq: Four times a day (QID) | ORAL | Status: DC | PRN
  Administered 2021-02-11: 650 mg via ORAL
  Filled 2021-02-09: qty 2

## 2021-02-09 MED ORDER — IBUPROFEN 800 MG PO TABS
800.0000 mg | ORAL_TABLET | Freq: Four times a day (QID) | ORAL | Status: DC | PRN
  Administered 2021-02-09: 800 mg via ORAL
  Filled 2021-02-09: qty 1

## 2021-02-09 MED ORDER — PROSOURCE PLUS PO LIQD
30.0000 mL | Freq: Two times a day (BID) | ORAL | Status: DC
  Filled 2021-02-09 (×3): qty 30

## 2021-02-09 MED ORDER — MAGNESIUM HYDROXIDE 400 MG/5ML PO SUSP
30.0000 mL | Freq: Every day | ORAL | Status: DC | PRN
  Filled 2021-02-09: qty 30

## 2021-02-09 MED ORDER — HALOPERIDOL 5 MG PO TABS
5.0000 mg | ORAL_TABLET | Freq: Two times a day (BID) | ORAL | Status: DC
  Administered 2021-02-09: 5 mg via ORAL
  Filled 2021-02-09 (×5): qty 1

## 2021-02-09 NOTE — Consult Note (Signed)
Patient accepted to [facility] later today. MAR reviewed and admission orders placed.  EKG today: QT/QTc 404/423  Haldol 5 mg PO BID ordered. Haldol PRN discontinued.   EKG ordered for 02/10/21.  Staff to monitor for safety while awaiting transfer to [facility].

## 2021-02-09 NOTE — Progress Notes (Signed)
Physical Therapy Treatment Patient Details Name: Juan Juarez MRN: [id number] DOB: 07-Feb-1956 Today's Date: 02/09/2021    History of Present Illness 65 year old male with a history of polysubstance abuse including cocaine, tobacco, and alcohol, diabetes mellitus type 2, hypertension, depression, metastatic prostate cancer, seizure disorder, chronic hepatitis C presenting with unresponsiveness.  Apparently, the patient informed his family that he wanted to kill himself.  The patient was found in a motel unresponsive and hypoxic.  Apparently, there were pill bottles strewn around him when he was found by EMS.  The patient was given Narcan with no improvement.  His CBG was greater than 100 at that time.  Upon arrival to the emergency department, the patient remained obtunded.  He was noted to be hypothermic and hypotensive.  He was intubated.  Patient was started on IV fluids.  The patient was given 5 L of fluid.  He was placed on a warming blanket.  Empiric antibiotics were started.  The patient had empty bottles of Clobazam, sertraline, metformin, and alprazolam around him.    PT Comments    Patient agreeable to participating in therapy session today and requesting to walk in hallways. Patient modified independent with bed mobility. Requiring min assist to don socks and encouragement to use left upper extremity. Trialed SPC during transfers and ambulation.  Patient somewhat unsteady with transfers. No upper extremity assist with sit to stand and return to sitting. Patient exhibited somewhat slow, unsteady gait with SPC in use at times and other times not in use; frequent staggering/drifting to left and right but able to regain balance independently. Patient not limited by pain or fatigue and on room air.  Patient would continue to benefit from skilled physical therapy in current environment and next venue to continue return to prior function and increase strength, endurance, balance, coordination, and  functional mobility and gait skills.    Follow Up Recommendations  Home health PT     Equipment Recommendations  Rolling walker with 5" wheels    Recommendations for Other Services       Precautions / Restrictions Precautions Precautions: Fall Restrictions Weight Bearing Restrictions: No    Mobility  Bed Mobility Overal bed mobility: Modified Independent             General bed mobility comments: increased time    Transfers Overall transfer level: Needs assistance Equipment used: Straight cane Transfers: Sit to/from Stand;Stand Pivot Transfers Sit to Stand: Min guard;Supervision Stand pivot transfers: Min guard;Supervision       General transfer comment: somewhat unsteady with transfers, no upper extremity assist with sit to stand and return to sitting  Ambulation/Gait Ambulation/Gait assistance: Min guard Gait Distance (Feet): 350 Feet Assistive device: Straight cane Gait Pattern/deviations: Step-through pattern;Decreased step length - right;Decreased step length - left;Decreased stride length;Drifts right/left;Narrow base of support;Staggering left;Staggering right Gait velocity: decreased   General Gait Details: somewhat slow unsteady gait with SPC in use at times and other times not in use, frequent staggering/drifting to left and right able to regain balance independently; not limited by pain or fatigue; on room air   Stairs             Wheelchair Mobility    Modified Rankin (Stroke Patients Only)       Balance Overall balance assessment: Needs assistance Sitting-balance support: No upper extremity supported;Feet supported Sitting balance-Leahy Scale: Good     Standing balance support: No upper extremity supported;During functional activity Standing balance-Leahy Scale: Poor Standing balance comment: fair/poor; fair with SPC  Cognition Arousal/Alertness: Awake/alert Behavior During Therapy: WFL  for tasks assessed/performed Overall Cognitive Status: Within Functional Limits for tasks assessed                                        Exercises General Exercises - Lower Extremity Long Arc Quad: AROM;Strengthening;Both;10 reps;Seated Hip Flexion/Marching: AROM;Strengthening;Both;10 reps;Standing (with UE support through RW) Toe Raises: Strengthening;Both;10 reps;Standing;AROM Heel Raises: AROM;Strengthening;Both;10 reps;Standing    General Comments General comments (skin integrity, edema, etc.): swelling noted over right first ray      Pertinent Vitals/Pain Pain Assessment: Faces Faces Pain Scale: Hurts a little bit Pain Location: right great toe Pain Descriptors / Indicators: Aching Pain Intervention(s): Limited activity within patient's tolerance;Monitored during session    Home Living                      Prior Function            PT Goals (current goals can now be found in the care plan section) Acute Rehab PT Goals Patient Stated Goal: Go home. PT Goal Formulation: With patient Time For Goal Achievement: 02/20/21 Potential to Achieve Goals: Fair Progress towards PT goals: Progressing toward goals    Frequency    Min 3X/week      PT Plan Current plan remains appropriate       AM-PAC PT "6 Clicks" Mobility   Outcome Measure  Help needed turning from your back to your side while in a flat bed without using bedrails?: None Help needed moving from lying on your back to sitting on the side of a flat bed without using bedrails?: None Help needed moving to and from a bed to a chair (including a wheelchair)?: A Little Help needed standing up from a chair using your arms (e.g., wheelchair or bedside chair)?: A Little Help needed to walk in hospital room?: A Little Help needed climbing 3-5 steps with a railing? : A Lot 6 Click Score: 19    End of Session Equipment Utilized During Treatment: Gait belt Activity Tolerance: Patient  tolerated treatment well Patient left: in bed;with call bell/phone within reach;with nursing/sitter in room Nurse Communication: Mobility status PT Visit Diagnosis: Unsteadiness on feet (R26.81);Other abnormalities of gait and mobility (R26.89)     Time: 1110-1135 PT Time Calculation (min) (ACUTE ONLY): 25 min  Charges:  $Gait Training: 8-22 mins $Therapeutic Exercise: 8-22 mins                     Juan Juarez. Hartnett-Rands, MS, PT Per [place] [phone]  Juan Hurry  Juarez 02/09/2021, 11:50 AM

## 2021-02-09 NOTE — Progress Notes (Signed)
Patient seen and evaluated today.  He appears to be resting calmly.  He is complaining of right great toe pain that did not appear to improve well with the use of Tylenol, and therefore ibuprofen will be initiated.  His foot x-ray does not demonstrate any acute fractures, but there is severe degeneration on that toe.  Please refer to discharge summary dictated 8/9 for full details.  Awaiting [facility] bed.  Total care time: 15 minutes.

## 2021-02-09 NOTE — Progress Notes (Signed)
Group was not held due to staffing.

## 2021-02-09 NOTE — Progress Notes (Signed)
Pt accepted to [facility] 405-1   Patient meets inpatient criteria per Merlyn Lot, NP  Dr.Greg Mallie Darting is the attending provider.    Call report to [phone]    Sandi Mariscal, RN @ [facility] ED notified.     Pt scheduled  to arrive at [facility] today after 1500.   Mariea Clonts, MSW, LCSW-A  1:12 PM 02/09/2021

## 2021-02-09 NOTE — Care Management Important Message (Signed)
Important Message  Patient Details  Name: Juan Juarez MRN: [id number] Date of Birth: 15-Dec-1955   Medicare Important Message Given:  Yes     [name] 02/09/2021, 3:29 PM

## 2021-02-10 ENCOUNTER — Encounter (HOSPITAL_COMMUNITY): Payer: Self-pay | Admitting: Adult Health

## 2021-02-10 ENCOUNTER — Other Ambulatory Visit: Payer: Self-pay

## 2021-02-10 DIAGNOSIS — F332 Major depressive disorder, recurrent severe without psychotic features: Secondary | ICD-10-CM

## 2021-02-10 LAB — COMPREHENSIVE METABOLIC PANEL
ALT: 15 U/L (ref 0–44)
AST: 22 U/L (ref 15–41)
Albumin: 3.2 g/dL — ABNORMAL LOW (ref 3.5–5.0)
Alkaline Phosphatase: 73 U/L (ref 38–126)
Anion gap: 7 (ref 5–15)
BUN: 22 mg/dL (ref 8–23)
CO2: 26 mmol/L (ref 22–32)
Calcium: 9.1 mg/dL (ref 8.9–10.3)
Chloride: 107 mmol/L (ref 98–111)
Creatinine, Ser: 1.36 mg/dL — ABNORMAL HIGH (ref 0.61–1.24)
GFR, Estimated: 57 mL/min — ABNORMAL LOW (ref >60)
Glucose, Bld: 130 mg/dL — ABNORMAL HIGH (ref 70–99)
Potassium: 4.4 mmol/L (ref 3.5–5.1)
Sodium: 140 mmol/L (ref 135–145)
Total Bilirubin: 0.7 mg/dL (ref 0.3–1.2)
Total Protein: 7.9 g/dL (ref 6.5–8.1)

## 2021-02-10 LAB — GLUCOSE, CAPILLARY
Glucose-Capillary: 180 mg/dL — ABNORMAL HIGH (ref 70–99)
Glucose-Capillary: 143 mg/dL — ABNORMAL HIGH (ref 70–99)
Glucose-Capillary: 112 mg/dL — ABNORMAL HIGH (ref 70–99)
Glucose-Capillary: 126 mg/dL — ABNORMAL HIGH (ref 70–99)
Glucose-Capillary: 144 mg/dL — ABNORMAL HIGH (ref 70–99)

## 2021-02-10 LAB — LIPID PANEL
Cholesterol: 190 mg/dL (ref 0–200)
HDL: 26 mg/dL — ABNORMAL LOW (ref >40)
LDL Cholesterol: 132 mg/dL — ABNORMAL HIGH (ref 0–99)
Total CHOL/HDL Ratio: 7.3 RATIO
Triglycerides: 160 mg/dL — ABNORMAL HIGH (ref <150)
VLDL: 32 mg/dL (ref 0–40)

## 2021-02-10 LAB — TSH: TSH: 7.09 u[IU]/mL — ABNORMAL HIGH (ref 0.350–4.500)

## 2021-02-10 MED ORDER — HALOPERIDOL 2 MG PO TABS
2.0000 mg | ORAL_TABLET | Freq: Two times a day (BID) | ORAL | Status: DC
  Administered 2021-02-10 – 2021-02-11 (×2): 2 mg via ORAL
  Filled 2021-02-10 (×8): qty 1

## 2021-02-10 MED ORDER — AMOXICILLIN-POT CLAVULANATE 875-125 MG PO TABS
1.0000 | ORAL_TABLET | Freq: Two times a day (BID) | ORAL | Status: AC
  Administered 2021-02-10 – 2021-02-16 (×14): 1 via ORAL
  Filled 2021-02-10 (×20): qty 1

## 2021-02-10 MED ORDER — GLUCERNA SHAKE PO LIQD
237.0000 mL | Freq: Two times a day (BID) | ORAL | Status: DC
  Administered 2021-02-10 – 2021-02-24 (×21): 237 mL via ORAL
  Filled 2021-02-10 (×35): qty 237

## 2021-02-10 MED ORDER — QUETIAPINE FUMARATE 100 MG PO TABS
100.0000 mg | ORAL_TABLET | Freq: Every day | ORAL | Status: DC
  Administered 2021-02-10: 100 mg via ORAL
  Filled 2021-02-10 (×3): qty 1

## 2021-02-10 MED ORDER — ENSURE ENLIVE PO LIQD
237.0000 mL | Freq: Two times a day (BID) | ORAL | Status: DC
  Filled 2021-02-10 (×3): qty 237

## 2021-02-10 MED ORDER — ALPRAZOLAM 0.5 MG PO TABS
0.5000 mg | ORAL_TABLET | Freq: Every evening | ORAL | Status: DC | PRN
  Administered 2021-02-10 – 2021-02-11 (×2): 0.5 mg via ORAL
  Filled 2021-02-10 (×2): qty 1

## 2021-02-10 MED ORDER — IBUPROFEN 800 MG PO TABS
800.0000 mg | ORAL_TABLET | Freq: Once | ORAL | Status: AC
  Administered 2021-02-10: 800 mg via ORAL
  Filled 2021-02-10 (×2): qty 1

## 2021-02-10 NOTE — BHH Suicide Risk Assessment (Signed)
[facility] Admission Suicide Risk Assessment   Nursing information obtained from:  Patient Demographic factors:  Male, Low socioeconomic status, Unemployed, Living alone Current Mental Status:  Suicidal ideation indicated by patient Loss Factors:  Legal issues Historical Factors:  Prior suicide attempts Risk Reduction Factors:  NA  Total Time spent with patient: 30 minutes Principal Problem: <principal problem not specified> Diagnosis:  Active Problems:   MDD (major depressive disorder)  Subjective Data: Patient is seen and examined.  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  The patient was found unresponsive in a local hotel room.  He was brought to the emergency department by emergency medical services.  The patient had apparently made comments earlier of wanting to kill himself.  The patient was found with multiple pill bottles around the scene.  He was given Narcan at the scene without any success.  The empty bottles around him included Zoloft, metformin, clonazepam, Xanax.  He was noted to be hypoxic and hypotensive on arrival to the hospital.  He was treated with a bag mask valve without difficulty and his oxygen saturation increased to 100% without difficulty.  He was intubated at that time.  His drug screen revealed benzodiazepines as well as cocaine.  Laboratories on admission also revealed normocytic anemia, hyperglycemia, BUN and creatinine ratio of 20/1.37.  His albumin was 2.9, lactic acid 1.4.  Acetaminophen ethanol and salicylate levels were  all negative.  His ammonia was 26.  He had a CT scan of the head that in the emergency department was interpreted as no acute intracranial abnormality.  My review of the results revealed moderate parenchymal volume loss, asymmetrically more severe involving the right temporal lobe.  This appeared to be similar to previous examination.  There was borderline ventriculomegaly as well as asymmetrically more severe involving the right lateral ventricle likely represented the sequela of central atrophy as well as result and ex vacuo dilatation.  There was an interval development of a remote lacunar infarct within the left thalamus as well.  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  There was no evidence of acute intracranial hemorrhage or infarct.  It was thought to reveal relatively advanced senescent changes, asymmetrically more severe involving the right temporal lobe, and possibly related to remote trauma.  He does have a history of traumatic brain injury.  There was also interval development of a remote lacunar infarct within the left thalamus.  Also seen on the skull were bur holes within the right frontal and parietal regions were again noted.  He apparently has had intracranial elevated pressure that required this.  His urinalysis showed greater than 300 mg per DL of protein.  He had significant hematuria.  He was extubated on 01/31/2021.  A palliative care medicine consultation was obtained on 01/31/2021.  The patient has a history of prostate cancer that was made earlier this year.  He was scheduled to have radiation therapy in June, but was in jail because of her previously missed court date.  He appears to become delirious during the course of the hospitalization, and received haloperidol for his aggressive  behaviors.  Psychiatric consultation was attempted on 2 occasions, and a Education officer, museum finally met with the patient on 02/05/2021.  He stated at that time  that he did not really want to die.  He stated he received his depressive medicine as well as anxiety medicine as an outpatient on the [facility].  He denied any previous psychiatric admissions.  He stated at that time that he had been sober of alcohol for "quite a while".  He told the evaluating team at that time it was 5 or 6 years.  While there several Education officer, museum notes I do not see any notes on psychiatric consultation.  There is a note from 8/11 in which the nurse practitioner recommended Haldol as a standing order on admission.  The patient was admitted to the psychiatric facility on 02/09/2021.  On arrival shortly thereafter in the evening he had a fall, but apparently suffered no repercussions of that.  On examination today he is alert and oriented to 3.  He has slurred speech as well as some cognitive deficits.  On original examination he stated that he thought the current president was [name], but with some assistance was able to remember that [name] was the current president and that [name] had been present prior to that.  He stated that he had not had any alcohol in 5 or 6 years.  He stated that he was upset and wanted to take the overdose because he was scheduled to go back to court for a DUI.  He stated he did not want to go back to court.  He stated he had been living in the hotel for well over a year.  He stated that he had moved out from his mother's quite a while ago because "we were to similar".  He denied any current suicidal ideation.  He denied any auditory or visual hallucinations.  He denied any previous psychiatric admissions.  He stated the last time that he had been in any form of a rehabilitation facility was many years ago.  Review of the PMP database revealed his last prescription for Xanax was on 01/17/2021.  He received 60 tablets at that time.  He is on Onfi 10 mg p.o. nightly for seizure disorder.  It does appear that he has been receiving the Xanax for greater than  1 year.  His gait is unstable.  And physical therapy consultation has been requested.  He stated that his goal was to return to the hotel.  He was admitted to the hospital for evaluation and stabilization.  Continued Clinical Symptoms:  Alcohol Use Disorder Identification Test Final Score (AUDIT): 0 The "Alcohol Use Disorders Identification Test", Guidelines for Use in Primary Care, Second Edition.  World Pharmacologist [facility]). Score between 0-7:  no or low risk or alcohol related problems. Score between 8-15:  moderate risk of alcohol related problems. Score between 16-19:  high risk of alcohol related problems. Score 20 or above:  warrants further diagnostic evaluation for alcohol dependence and treatment.   CLINICAL FACTORS:   Depression:   Anhedonia Comorbid alcohol abuse/dependence Impulsivity Insomnia Alcohol/Substance Abuse/Dependencies Unstable or Poor Therapeutic Relationship Previous Psychiatric Diagnoses and Treatments Medical Diagnoses and Treatments/Surgeries   Musculoskeletal: Strength & Muscle Tone: decreased Gait & Station: unsteady Patient leans: N/A  Psychiatric Specialty Exam:  Presentation  General Appearance:  No data recorded Eye Contact: No data recorded Speech: No data recorded Speech Volume: No data recorded Handedness: No data recorded  Mood and Affect  Mood:  No data recorded Affect: No data recorded  Thought Process  Thought Processes: No data recorded Descriptions of Associations:No data recorded Orientation:No data recorded Thought Content:No data recorded History of Schizophrenia/Schizoaffective disorder:No  Duration of Psychotic Symptoms:No data recorded Hallucinations:No data recorded Ideas of Reference:No data recorded Suicidal Thoughts:No data recorded Homicidal Thoughts:No data recorded  Sensorium  Memory: No data recorded Judgment: No data recorded Insight: No data recorded  Executive Functions   Concentration: No data recorded Attention Span: No data recorded Recall: No data recorded Fund of Knowledge: No data recorded Language: No data recorded  Psychomotor Activity  Psychomotor Activity: No data recorded  Assets  Assets: No data recorded  Sleep  Sleep: No data recorded   Physical Exam: Physical Exam Vitals and nursing note reviewed.  HENT:     Head: Normocephalic and atraumatic.  Pulmonary:     Effort: Pulmonary effort is normal.  Neurological:     General: No focal deficit present.     Mental Status: He is alert.   Review of Systems  Constitutional:  Positive for malaise/fatigue.  All other systems reviewed and are negative. Blood pressure 126/85, pulse 63, temperature 98.7 F (37.1 C), temperature source Oral, resp. rate 18, height 5' 6.75" (1.695 m), weight 83.5 kg, SpO2 98 %. Body mass index is 29.03 kg/m.   COGNITIVE FEATURES THAT CONTRIBUTE TO RISK:  Thought constriction (tunnel vision)    SUICIDE RISK:   Mild:  Suicidal ideation of limited frequency, intensity, duration, and specificity.  There are no identifiable plans, no associated intent, mild dysphoria and related symptoms, good self-control (both objective and subjective assessment), few other risk factors, and identifiable protective factors, including available and accessible social support.  PLAN OF CARE: Patient is seen and examined.  Patient is a 65 year old male with the above-stated past medical and past psychiatric history who was transferred to our facility after an intentional overdose.  He will be admitted to the hospital.  He will be integrated in the milieu.  He will be encouraged to attend groups.  He is not currently psychotic, and appears to be somewhat unstable on his feet.  His Haldol dosage was changed by the admitting nurse practitioner to standing.  I am going to reduce that to 2 mg p.o. twice daily, and monitor for any recurrence of psychotic symptoms.  He apparently was  treated with sertraline, doxepin and possibly Neurontin for psychiatric issues.  Last notation of his sertraline dosage was 100 mg p.o. daily from 11/15/2020.  Given his history of cirrhosis, hepatitis we will restart the Zoloft at 100 mg p.o. daily.  Certainly given the overdose of the doxepin that will be stopped.  We will also restart his gabapentin 300 mg p.o. 3 times daily and titrate that during the course of the hospitalization.  Given that he has been in the medical hospital since approximately 8/1 and apparently continued on Xanax 0.5 mg p.o. nightly as needed we will continue this for now, but I suggest given his overdose that it be stopped.  His chest x-ray on 8/4 showed diffuse infiltrates, and apparently his discharge medications included Augmentin 875 twice daily.  Unfortunately this was not continued on admission and I will restart that.  With regard to his diabetes we will continue his glipizide/Glucotrol XL 5 mg p.o. daily as well as his metformin.  We will also add a sliding scale in case.  It also appears that he had been placed on Seroquel 100 mg p.o. twice daily.  Given the  presence of the Haldol at this point we will not do it twice daily, but because of his sleep complaints we will start it at 100 mg p.o. nightly.  This will be adjusted throughout the course of the hospitalization.  He had apparently been prescribed oxycodone in the past for his metastatic disease, but it looks like that was stopped while at [facility].  We will also have to address whether or not he remains in palliative care or hospice given his metastatic disease.  Pain medications may become necessary.  Initially his blood pressure was mildly elevated in our hospital at 157/75, this morning is 126/85.  Pulse is 63.  He is afebrile.  He is 98% on room air for pulse oximetry.  He only slept 5 hours last night.  Review of his admission laboratories revealed electrolytes from 8/7 that showed essentially normal electrolytes  including a creatinine of 1.14.  His liver function enzymes were normal.  His total protein was 6.3, ammonia was 26 on 8/1, but we will repeat that.  On 8/5 his BNP was 628.  I was unable to find any evidence of heart failure on my chart review, but we will repeat his BNP just in case.  His folic acid levels as well as vitamin B-12 were normal.  His CBC showed a normal hemoglobin and hematocrit as well as white blood cell count.  PT and INR were normal.  Acetaminophen was less than 10.  Cortisol in the a.m. was 9.8.  Hemoglobin A1c is 5.8.  His platelet count was slightly low at 149,000.  His differential was essentially normal.  This is actually slightly up from his baseline.  2 months ago his platelet count was 110,000.  Procalcitonin was less than 0.10.  Respiratory panel for influenza A, B and coronavirus were all negative.  HIV was negative.  As stated previously his urinalysis was significant for hematuria and proteinuria.  We will repeat his urinalysis.  Drug screen was positive for benzodiazepines and cocaine.  Blood cultures and urine culture were apparently negative so far.  CT scan of the head and chest x-ray as per above.  His EKG showed a normal sinus rhythm with a normal QTc interval.  He had an echocardiogram obtained on 02/04/2021.  His ejection fraction was greater than 75%.  There were no regional wall motion abnormalities.  The rest of the echo appears to have been normal except for some mild aortic valve stenosis.  We will have to be in contact with his family to address disposition issues.  It also sounds like he has still pending court date.  He stated today that that was due to another DUI.  He stated he had somewhere between 4-5 DUIs in the past.  I certify that inpatient services furnished can reasonably be expected to improve the patient's condition.   Sharma Covert, MD 02/10/2021, 9:47 AM

## 2021-02-10 NOTE — Progress Notes (Signed)
   D:  Pt presents with high anxiety and no depression on assessment.  Pt complains of Right big toe pain with swelling throughout the foot, and Left arm pain related to a previous carpal tunnel surgery and rates it as 10/10.  Pt states, "I cannot take tylenol because of my liver."  Pt denies SI/HI, and verbally contracts for safety.  Pt states, "I had visions for a while when they changed my seizure meds, but not since then."  Pt denies AH.  Pt is alert and oriented x 4 (name, DOB, city, president), but does not recognize place.  A:  Lab/Vitals monitored; Medication administered; Pt encouraged to communicate concerns.  R:  Pt remains safe on unit with 1:1 observation.  Pt remains safe on unit with Q15 minute safety checks.

## 2021-02-10 NOTE — Progress Notes (Signed)
Pt is non-compliant with safety measures at times. Pt has to be told to wear non-skid socks numerous times. Order for walker obtained and pt encouraged to utilize walker as much as possible

## 2021-02-10 NOTE — Progress Notes (Signed)
Pt has been irritable about getting the vital signs, pt refusing to get the vitals done.

## 2021-02-10 NOTE — H&P (Signed)
Psychiatric Admission Assessment Adult  Patient Identification: Juan Juarez  MRN:  [id number]  Date of Evaluation:  02/10/2021  Chief Complaint:  MDD (major depressive disorder) [F32.9]  Principal Diagnosis: MDD (major depressive disorder)  Diagnosis:  Principal Problem:   MDD (major depressive disorder)  History of Present Illness: (Per Md's admission evaluation notes): Patient is seen and examined.  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  The patient was found unresponsive in a local hotel room.  He was brought to the emergency department by emergency medical services.  The patient had apparently made comments earlier of wanting to kill himself.  The patient was found with multiple pill bottles around the scene.  He was given Narcan at the scene without any success.  The empty bottles around him included Zoloft, metformin, clonazepam, Xanax.  He was noted to be hypoxic and hypotensive on arrival to the hospital.  He was treated with a bag mask valve without difficulty and his oxygen saturation increased to 100% without difficulty.  He was intubated at that time.  His drug screen revealed benzodiazepines as well as cocaine.  Laboratories on admission also revealed normocytic anemia, hyperglycemia, BUN and creatinine ratio of 20/1.37.  His albumin was 2.9, lactic acid 1.4.  Acetaminophen ethanol and salicylate levels were all negative.  His ammonia was 26.  He had a CT scan of the head that in the emergency department was interpreted as no acute  intracranial abnormality.  My review of the results revealed moderate parenchymal volume loss, asymmetrically more severe involving the right temporal lobe.  This appeared to be similar to previous examination.  There was borderline ventriculomegaly as well as asymmetrically more severe involving the right lateral ventricle likely represented the sequela of central atrophy as well as result and ex vacuo dilatation.  There was an interval development of a remote lacunar infarct within the left thalamus as well.  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  There was no evidence of acute intracranial hemorrhage or infarct.  It was thought to reveal relatively advanced senescent changes, asymmetrically more severe involving the right temporal lobe, and possibly related to remote trauma.  He does have a history of traumatic brain injury.  There was also interval development of a remote lacunar infarct within the left thalamus.  Also seen on the skull were bur holes within the right frontal and parietal regions were again noted.  He apparently has had intracranial elevated pressure that required this.  His urinalysis showed greater than 300 mg per DL of protein.  He had significant hematuria.  He was extubated on 01/31/2021.  A palliative care medicine consultation was obtained on 01/31/2021.  The patient has a history of prostate cancer that was made earlier this year.  He was scheduled to have radiation therapy in June, but was in jail because of her previously missed court date.  He appears to become delirious during the course of the hospitalization, and received haloperidol for his aggressive behaviors.  Psychiatric consultation was attempted on 2 occasions, and a Education officer, museum finally met with the patient on  02/05/2021.  He stated at that time that he did not really want to die.  He stated he received his depressive medicine as well as anxiety medicine as an outpatient  on the [facility] care center.  He denied any previous psychiatric admissions.  He stated at that time that he had been sober of alcohol for "quite a while".  He told the evaluating team at that time it was 5 or 6 years.  While there several Education officer, museum notes I do not see any notes on psychiatric consultation.  There is a note from 8/11 in which the nurse practitioner recommended Haldol as a standing order on admission.  The patient was admitted to the psychiatric facility on 02/09/2021.  On arrival shortly thereafter in the evening he had a fall, but apparently suffered no repercussions of that.   On examination today he is alert and oriented to 3.  He has slurred speech as well as some cognitive deficits.  On original examination he stated that he thought the current president was [name], but with some assistance was able to remember that [name] was the current president and that [name] had been present prior to that.  He stated that he had not had any alcohol in 5 or 6 years.  He stated that he was upset and wanted to take the overdose because he was scheduled to go back to court for a DUI.  He stated he did not want to go back to court.  He stated he had been living in the hotel for well over a year.  He stated that he had moved out from his mother's quite a while ago because "we were too similar".  He denied any current suicidal ideation.  He denied any auditory or visual hallucinations.  He denied any previous psychiatric admissions.  He stated the last time that he had been in any form of a rehabilitation facility was many years ago.  Review of the PMP database revealed his last prescription for Xanax was on 01/17/2021.  He received 60 tablets at that time.  He is on Onfi 10 mg p.o. nightly for seizure disorder.  It does appear that he has been receiving the Xanax for greater than 1 year.  His gait is unstable.  And physical therapy consultation has been requested.  He stated that his goal was to return  to the hotel.  He was admitted to the hospital for evaluation and stabilization.  Associated Signs/Symptoms:  Depression Symptoms:  depressed mood, hopelessness, suicidal thoughts with specific plan, suicidal attempt,  Duration of Depression Symptoms: Greater than two weeks  (Hypo) Manic Symptoms:   Denies  Anxiety Symptoms:  Excessive Worry,  Psychotic Symptoms:   Denies  PTSD Symptoms: NA  Total Time spent with patient: 1 hour  Past Psychiatric History: Yes, depression & anxiety.  Is the patient at risk to self? Yes.    Has the patient been a risk to self in the past 6 months? Yes.    Has the patient been a risk to self within the distant past? No.  Is the patient a risk to others? No.  Has the patient been a risk to others in the past 6 months? No.  Has the patient been a risk to others within the distant past? No.   Prior Inpatient Therapy: Yes, "40 years ago for drug addiction) Prior Outpatient Therapy: Yes with [facility].  Alcohol Screening: 1. How often do you have a drink containing alcohol?: Never 2. How  many drinks containing alcohol do you have on a typical day when you are drinking?: 1 or 2 3. How often do you have six or more drinks on one occasion?: Never AUDIT-C Score: 0 4. How often during the last year have you found that you were not able to stop drinking once you had started?: Never 5. How often during the last year have you failed to do what was normally expected from you because of drinking?: Never 6. How often during the last year have you needed a first drink in the morning to get yourself going after a heavy drinking session?: Never 7. How often during the last year have you had a feeling of guilt of remorse after drinking?: Never 8. How often during the last year have you been unable to remember what happened the night before because you had been drinking?: Never 9. Have you or someone else been injured as a result of your drinking?: No 10.  Has a relative or friend or a doctor or another health worker been concerned about your drinking or suggested you cut down?: No Alcohol Use Disorder Identification Test Final Score (AUDIT): 0  Substance Abuse History in the last 12 months:  Yes.    Consequences of Substance Abuse: Discussed witg patient during this admission evaluation. Medical Consequences:  Liver damage, Possible death by overdose Legal Consequences:  Arrests, jail time, Loss of driving privilege. Family Consequences:  Family discord, divorce and or separation.   Previous Psychotropic Medications: Yes   Psychological Evaluations: No   Past Medical History:  Past Medical History:  Diagnosis Date   Anxiety    Chronic elbow pain, left    Depression    Diabetes mellitus, type II ([place])    type II   Gout    Head injury 12/2017   Hepatitis C without hepatic coma    Hypertension    denies   Inflammatory arthropathy    left elbow   Insomnia    Non compliance w medication regimen    Osteoarthritis    Polysubstance abuse ([place])    Prostate cancer ([place])    Protein-calorie malnutrition, severe ([place]) 09/13/2016   Seizure ([place]) 12/2017   after head Injury   Thrombocytopenia [facility])     Past Surgical History:  Procedure Laterality Date   BURR HOLE Right 01/22/2018   Procedure: RIGHT BURR HOLES;  Surgeon: Newman Pies, MD;  Location: [place];  Service: Neurosurgery;  Laterality: Right;   I & D EXTREMITY Left 11/06/2018   Procedure: IRRIGATION AND DEBRIDEMENT LEFT ELBOW JOINT;  Surgeon: Charlotte Crumb, MD;  Location: [place];  Service: Orthopedics;  Laterality: Left;   INCISION AND DRAINAGE Left 10/24/2018   Procedure: INCISION AND DRAINAGE LEFT ELBOW WITH MASS EXCISION;  Surgeon: Charlotte Crumb, MD;  Location: [place];  Service: Orthopedics;  Laterality: Left;   MULTIPLE TOOTH EXTRACTIONS     None     Family History:  Family History  Problem Relation Age of Onset   Thyroid disease Sister    Colon cancer Neg Hx     Colon polyps Neg Hx    Breast cancer Neg Hx    Prostate cancer Neg Hx    Pancreatic cancer Neg Hx    Family Psychiatric  History: Yes, Xanax.  Tobacco Screening:   Social History:  Social History   Substance and Sexual Activity  Alcohol Use Not Currently   Comment: quit 2019     Social History   Substance and Sexual Activity  Drug Use Not Currently   Types: Marijuana, Cocaine   Comment: cocaine last use 10/26/2018, Marijuana sping 2019    Additional Social History:  Allergies:  No Known Allergies  Lab Results:  Results for orders placed or performed during the hospital encounter of 02/09/21 (from the past 48 hour(s))  Glucose, capillary     Status: Abnormal   Collection Time: 02/09/21  8:06 PM  Result Value Ref Range   Glucose-Capillary 129 (H) 70 - 99 mg/dL    Comment: Glucose reference range applies only to samples taken after fasting for at least 8 hours.  Glucose, capillary     Status: Abnormal   Collection Time: 02/10/21  1:51 AM  Result Value Ref Range   Glucose-Capillary 180 (H) 70 - 99 mg/dL    Comment: Glucose reference range applies only to samples taken after fasting for at least 8 hours.  Glucose, capillary     Status: Abnormal   Collection Time: 02/10/21  6:17 AM  Result Value Ref Range   Glucose-Capillary 143 (H) 70 - 99 mg/dL    Comment: Glucose reference range applies only to samples taken after fasting for at least 8 hours.   Blood Alcohol level:  Lab Results  Component Value Date   ETH <10 01/29/2021   ETH <10 76/54/6503   Metabolic Disorder Labs:  Lab Results  Component Value Date   HGBA1C 5.8 (H) 01/30/2021   MPG 119.76 01/30/2021   MPG 283.35 09/02/2018   No results found for: PROLACTIN Lab Results  Component Value Date   CHOL 138 11/10/2015   TRIG 156 (H) 11/10/2015   HDL 38 (L) 11/10/2015   CHOLHDL 3.6 11/10/2015   VLDL 31 (H) 11/10/2015   LDLCALC 69 11/10/2015   Current Medications: Current Facility-Administered Medications   Medication Dose Route Frequency Provider Last Rate Last Admin   acetaminophen (TYLENOL) tablet 650 mg  650 mg Oral Q6H PRN Chalmers Guest, NP       ALPRAZolam Duanne Moron) tablet 0.5 mg  0.5 mg Oral QHS PRN Sharma Covert, MD       alum & mag hydroxide-simeth (MAALOX/MYLANTA) 200-200-20 MG/5ML suspension 30 mL  30 mL Oral Q4H PRN Chalmers Guest, NP       amoxicillin-clavulanate (AUGMENTIN) 875-125 MG per tablet 1 tablet  1 tablet Oral Q12H Sharma Covert, MD       cloBAZam (ONFI) tablet 10 mg  10 mg Oral QHS Lovena Le, Cody W, PA-C   10 mg at 02/09/21 2129   feeding supplement (GLUCERNA SHAKE) (GLUCERNA SHAKE) liquid 237 mL  237 mL Oral BID BM Sharma Covert, MD       haloperidol (HALDOL) tablet 2 mg  2 mg Oral BID Sharma Covert, MD       hydrOXYzine (ATARAX/VISTARIL) tablet 25 mg  25 mg Oral TID PRN Chalmers Guest, NP       ibuprofen (ADVIL) tablet 800 mg  800 mg Oral Q8H Margorie John W, PA-C   800 mg at 02/10/21 5465   insulin aspart (novoLOG) injection 0-15 Units  0-15 Units Subcutaneous TID WC Chalmers Guest, NP   2 Units at 02/10/21 6812   insulin aspart (novoLOG) injection 0-5 Units  0-5 Units Subcutaneous QHS Chalmers Guest, NP       LORazepam (ATIVAN) tablet 1 mg  1 mg Oral BID PRN Chalmers Guest, NP       magnesium hydroxide (MILK OF MAGNESIA) suspension 30 mL  30 mL Oral  Daily PRN Chalmers Guest, NP       pantoprazole (PROTONIX) EC tablet 40 mg  40 mg Oral Daily Chalmers Guest, NP       QUEtiapine (SEROQUEL) tablet 100 mg  100 mg Oral QHS Sharma Covert, MD       PTA Medications: Medications Prior to Admission  Medication Sig Dispense Refill Last Dose   ALPRAZolam (XANAX) 0.5 MG tablet Take 0.5 mg by mouth at bedtime as needed.      blood glucose meter kit and supplies Dispense based on patient and insurance preference. Use up to four times daily as directed. (FOR ICD-10 E10.9, E11.9). 1 each 0    cloBAZam (ONFI) 10 MG tablet clobazam 10 mg tablet  TAKE ONE TABLET BY  MOUTH EVERY DAY      doxepin (SINEQUAN) 75 MG capsule Take 75 mg by mouth.      EASY COMFORT INSULIN SYRINGE 31G X 5/16" 1 ML MISC USE AS DIRECTED ONCE DAILY FOR LEVEMIR FLEXTOUCH 100 DAY SUPPLY      gabapentin (NEURONTIN) 300 MG capsule Take by mouth.      glipiZIDE (GLUCOTROL XL) 5 MG 24 hr tablet Take 5 mg by mouth daily.      LEVEMIR 100 UNIT/ML injection Inject 0.4 mLs (40 Units total) into the skin daily. 10 mL 0    metFORMIN (GLUCOPHAGE) 500 MG tablet Take 500 mg by mouth 2 (two) times daily.      QUEtiapine (SEROQUEL) 100 MG tablet Take 1 tablet (100 mg total) by mouth 2 (two) times daily. 60 tablet 0    sertraline (ZOLOFT) 100 MG tablet Take 100 mg by mouth daily.      simvastatin (ZOCOR) 10 MG tablet Take 10 mg by mouth daily.      Musculoskeletal: Strength & Muscle Tone: within normal limits Gait & Station: normal Patient leans: N/A  Psychiatric Specialty Exam:  Presentation  General Appearance:  Appropriate for Environment; Casual  Eye Contact: Good  Speech: Clear and Coherent; Normal Rate  Speech Volume: Normal  Handedness: Right  Mood and Affect  Mood: Euthymic Affect: Appropriate; Congruent  Thought Process   Thought Processes: Coherent; Goal Directed  Duration of Psychotic Symptoms: No data recorded  Past Diagnosis of Schizophrenia or Psychoactive disorder: No  Descriptions of Associations:Intact  Orientation:Full (Time, Place and Person)  Thought Content:Logical  Hallucinations:Hallucinations: None  Ideas of Reference:None  Suicidal Thoughts:Suicidal Thoughts: No  Homicidal Thoughts:Homicidal Thoughts: No  Sensorium  Memory: Immediate Good; Recent Good; Remote Good  Judgment: Fair  Insight: Good  Executive Functions  Concentration: Good  Attention Span: Good  Recall: Good  Fund of Knowledge: Good  Language: Good  Psychomotor Activity  Psychomotor Activity: Psychomotor Activity: Normal  Assets   Assets: Communication Skills; Desire for Improvement; Housing; Resilience  Sleep  Sleep: Sleep: Good Number of Hours of Sleep: 5 (Patient states that he "sleeps well at home")  Physical Exam: Physical Exam Vitals and nursing note reviewed.  HENT:     Head: Normocephalic.     Nose: Nose normal.     Mouth/Throat:     Pharynx: Oropharynx is clear.  Eyes:     Pupils: Pupils are equal, round, and reactive to light.  Cardiovascular:     Rate and Rhythm: Normal rate.     Pulses: Normal pulses.  Pulmonary:     Effort: Pulmonary effort is normal.  Genitourinary:    Prostate: Normal.     Comments: Hx. Prostate ca Musculoskeletal:  General: Normal range of motion.     Cervical back: Normal range of motion.     Comments: Hx. of recent falls.  Skin:    General: Skin is warm and dry.  Neurological:     General: No focal deficit present.     Mental Status: He is alert and oriented to person, place, and time. Mental status is at baseline.   Review of Systems  Constitutional:  Negative for chills, diaphoresis, fever and malaise/fatigue.  HENT:  Negative for congestion and sore throat.   Eyes: Negative.   Respiratory:  Negative for cough, shortness of breath and wheezing.   Cardiovascular:  Negative for chest pain and palpitations.  Gastrointestinal:  Negative for abdominal pain, diarrhea, heartburn, nausea and vomiting.       Hx. Liver cirrhosis  Genitourinary:  Positive for hematuria. Negative for dysuria.       Hx. Prostate Ca.  Musculoskeletal:  Positive for joint pain (C/o pain to the right bif toe.). Negative for falls.  Skin: Negative.   Neurological:  Positive for seizures. Negative for dizziness, tingling, tremors, sensory change, speech change, focal weakness, loss of consciousness and headaches.  Endo/Heme/Allergies:        Allergies: NKDA  Psychiatric/Behavioral:  Positive for depression and substance abuse (Hx. Cocaine use disorder.). Negative for hallucinations,  memory loss and suicidal ideas (Hx. of recent attempt by overdose.). The patient is not nervous/anxious and does not have insomnia.   Blood pressure 126/85, pulse 63, temperature 98.7 F (37.1 C), temperature source Oral, resp. rate 18, height 5' 6.75" (1.695 m), weight 83.5 kg, SpO2 98 %. Body mass index is 29.03 kg/m.  Treatment Plan Summary: Daily contact with patient to assess and evaluate symptoms and progress in treatment and Medication management.  Treatment Plan/Recommendations: 1. Admit for crisis management and stabilization, estimated length of stay 3-5 days.  2. Medication management to reduce current symptoms to base line and improve the patient's overall level of functioning: See [facility] for plan of care.  A. Anxiety disorder:  -Continue Alprazolam 0.5 mg po Q bedtime prn. -Continue Lorazepam 1 mg po bid prn. -Continue Vistaril 25 mg po tid prn.  B. Mood control. -Continue Haloperidol 2 mg po bid. -Continue Seroquel 100 mg po Q bedtime.   C. Other medical issues.  -Continue Augmentin 875-125 po Q 12 hrs for infection. -Continue Clobazam 10 mg po Q bedtime for seizures. -Continue Sliding scale insulin coverage as recommended per BS result. -Continue CBG monitoring as ordered.  D. Labs to be obtained: Ammonia levels, GC/chlamydia panel, RRR, TSH, Lipid panel.  Maintain 1:1 supervision for safety. Maintain seizure precaution. Pat uses roller walker.         3. Treat health problems as indicated.  4. Develop treatment plan to decrease risk of relapse upon discharge and the need for readmission.  5. Psycho-social education regarding relapse prevention and self care.  6. Health care follow up as needed for medical problems.  7. Review, reconcile, and reinstate any pertinent home medications for other health issues where appropriate. 8. Call for consults with hospitalist for any additional specialty patient care services as needed.   Observation Level/Precautions:  15 minute  checks  Laboratory:  Will obtain:  Ammonia levels, GC/chlamydia panel, RRR, TSH, Lipid panel.  Psychotherapy: Group milieu  Medications: See MAR   Consultations: As needed.   Discharge Concerns: Safety, mood stability, maintaining sobriety.   Estimated LOS:  3-5 days.  Other: Admit to the 400-hall.    Physician  Treatment Plan for Primary Diagnosis: MDD (major depressive disorder) Long Term Goal(s): Improvement in symptoms so as ready for discharge  Short Term Goals: Ability to identify changes in lifestyle to reduce recurrence of condition will improve, Ability to verbalize feelings will improve, Ability to disclose and discuss suicidal ideas, and Ability to demonstrate self-control will improve  Physician Treatment Plan for Secondary Diagnosis: Principal Problem:   MDD (major depressive disorder)  Long Term Goal(s): Improvement in symptoms so as ready for discharge  Short Term Goals: Ability to identify and develop effective coping behaviors will improve, Compliance with prescribed medications will improve, and Ability to identify triggers associated with substance abuse/mental health issues will improve  I certify that inpatient services furnished can reasonably be expected to improve the patient's condition.    Lindell Spar, NP, pmhnp, fnp-bc 8/12/202211:18 AM

## 2021-02-10 NOTE — Progress Notes (Addendum)
Patient ID: Juan Juarez, male   DOB: 23-Sep-1955, 65 y.o.   MRN: [id number]  Admission Note:  65 yr male who presents IVC in no acute distress for the treatment of SI and Depression. Pt appears flat and depressed. Pt was calm and cooperative with admission process. Pt stated he was tired and just OD on medication. Pt stated he has not had a drink in over 6 years, but keeps getting DUIs.   Per Assessment:  He is a 65 year old male with a history of polysubstance abuse including cocaine, tobacco, and alcohol, diabetes mellitus type 2, hypertension, depression, metastatic prostate cancer, seizure disorder, chronic hepatitis C presenting with unresponsiveness.  Apparently, the patient informed his family that he wanted to kill himself.  The patient was found in a motel unresponsive and hypoxic.  Apparently, there were pill bottles strewn around him when he was found by EMS. TTS Assessment:  Patient states that he is no longer suicidal.  However, when asked if he intentionally tried to kill himself, he states that he did.  When asked the reasons for hoim wanting to kill himself, patient states that he has experienced a hard life and he states that things just don't seem right in his life.  He states that he has chronic pain issues in his elbow and states that he has 3 kids that have nothing to do with him until they need money.  He states that he has loaned two of his kids thirty-five hundred dollars that they have never paid back.  Patient states that he has never tried to kill himself before and states that he is not sure why he tried to kill himself this time.  Patient states, "I don't really want to die."  Patient states that he is treated for his depression and anxiety on an OP baisis at the [facility] by [facility].  Patient states that he has never had any inpatient treatment. Patient denies HI/Psychosis.  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  Patient states that his appetite is good, but states that he has difficulty trying to go to sleep and states that he takes a .5 mg Xanax to help him go to sleep.  Patient states that he has minimal support in his life and states that he lives alone in [place].    Skin was assessed and found to be clear of any abnormal marks apart from a scar on L-elbow, bruise R-arm. PT searched and no contraband found, POC and unit policies explained and understanding verbalized. Consents obtained. Food and fluids offered, and fluids accepted.   R:Pt had no additional questions or concerns.

## 2021-02-10 NOTE — Progress Notes (Addendum)
Pt remains unsteady, unable to follow directions to ask for help prior to attempting to ambulate or get up out of bed, confused, with 1:1 sitter for safety. Denies pain, denies SI/HI/AVH. Is calm, and redirectable. No distress noted, none reported, pt voices no complaints, pt is calm.
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
## 2021-02-10 NOTE — Progress Notes (Addendum)
Signed                     Pt remains unsteady, unable to follow directions to ask for help prior to attempting to ambulate or get up out of bed, confused, with 1:1 sitter for safety. Denies pain, denies SI/HI/AVH. Is calm, and redirectable.       No distress noted, none reported, pt is calm, voices no complaints.

## 2021-02-10 NOTE — Progress Notes (Addendum)
Signed                     Pt remains unsteady, unable to follow directions to ask for help prior to attempting to ambulate or get up out of bed, confused, with 1:1 sitter for safety. Denies pain, denies SI/HI/AVH. Is calm, and redirectable.       No distress noted, none reported, pt voices no complaints.

## 2021-02-10 NOTE — Progress Notes (Signed)
Patient did attend the evening speaker AA meeting.  

## 2021-02-10 NOTE — Progress Notes (Signed)
The patient pressed the emergency button in his bathroom but shut it off before this author could respond.  Upon arriving at the patient's room, this author heard a thud and found the patient on the floor near the air conditioning unit. This author and the rest of the staff found water on the floor in the bedroom. Patient complained of hitting his left elbow and right knee.

## 2021-02-10 NOTE — Progress Notes (Addendum)
   02/10/21 2116  Vital Signs  Temp 98.5 F (36.9 C)  Temp Source Oral  Pulse Rate 66  Pulse Rate Source Monitor  BP (!) 173/90  BP Location Right Arm  BP Method Automatic  Patient Position (if appropriate) Sitting  Oxygen Therapy  SpO2 99 %    Provider notified of elevated BP and pt pain level of 10/10.  Administered PRN Xanax for anxiety per MAR. Administered scheduled one time dose of Ibuprofen per MAR.

## 2021-02-10 NOTE — Progress Notes (Signed)
  Per [facility], Ammonia lab drawn earlier will not be processed due to  Incorrect tube being utilized during collection.  Please order new Ammonia Lab.
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
## 2021-02-10 NOTE — Evaluation (Signed)
Physical Therapy Evaluation Patient Details Name: Juan Juarez MRN: [id number] DOB: 05/16/1956 Today's Date: 02/10/2021   History of Present Illness   Patient is a 65 y.o. 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 Patient presented to [facility] ED on 01/29/2021.  The patient was found unresponsive in a local hotel room.  He was brought to the emergency department by emergency medical services.  The patient had apparently made comments earlier of wanting to kill himself. Patient was intubated on 7/31 and extubated on 8/2. Patient transfered to [facility] on 8/11.   Clinical Impression  Juan Juarez is 65 y.o. mal admitted with above HPI and diagnosis. Patient is currently limited by functional impairments below (see PT problem list). Patient lives alone and is independent for mobility at baseline with assist form mother and sister for transportation and ADL's. Patient was able to ambulate ~300' with min guard and trial of RW. Pt more unsteady with RW due to poor safety awareness for walker management and will benefit from 1:1 assist/supervision with gait to prevent LOB and reduce fall risk. Patient scored an 11/24 on the DGI indicating high fall risk and completed 5xSit<>Stand in 16.81 seconds indicating generalized weakness. Patient will benefit from continued skilled PT interventions to address impairments and progress independence with mobility, recommending HHPT follow up vs OPPT. Acute PT will follow and progress as able.       02/10/21 1400  PT Visit Information  Last PT Received On 02/10/21  Assistance Needed +1  History of Present Illness Patient is a 65 y.o. 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 Patient presented to [facility] ED on 01/29/2021.  The patient was found unresponsive in a local hotel room.  He was brought to the emergency department by emergency medical services.  The patient had apparently made comments earlier of wanting to kill himself. Patient was intubated on 7/31 and extubated on 8/2. Patient transfered to [facility] on 8/11.  Precautions  Precautions Fall  Precaution Comments 1 fall at [facility] morning of 8/12  Restrictions  Weight Bearing Restrictions No  Home Living  Family/patient expects to be discharged to: Private residence  Living Arrangements Alone  Available Help at Discharge Family;Available PRN/intermittently  Type of Home Other(Comment) (Motel)  Home Access Stairs to enter  Entrance Stairs-Number of Steps 1  Entrance Stairs-Rails None  Home Layout One level  Bathroom Biomedical scientist No  Home Equipment None  Prior Function  Level of Independence Needs assistance  Gait / Transfers Assistance Needed pt reports mobilizes with walkign sticks or no device. 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, retail Expressive difficulties  Pain Assessment  Pain Assessment Faces  Faces Pain Scale 0  Pain Intervention(s) Monitored during session  Cognition  Arousal/Alertness Awake/alert  Behavior During Therapy WFL for tasks assessed/performed  Overall Cognitive Status No family/caregiver present to determine baseline cognitive functioning  Area  of Impairment Attention;Following commands;Safety/judgement;Awareness;Problem  solving  Current Attention Level Sustained  Following Commands Follows multi-step commands inconsistently;Follows multi-step commands with increased time;Follows one step commands consistently  Safety/Judgement Decreased awareness of safety;Decreased awareness of deficits  Awareness Emergent  Problem Solving Difficulty sequencing;Requires verbal cues;Slow processing  Upper Extremity Assessment  Upper Extremity Assessment Overall WFL for tasks assessed  Lower Extremity Assessment  Lower Extremity Assessment Generalized weakness;RLE deficits/detail  RLE Deficits / Details Rt foot edema noted. no errythema or warmth  Cervical / Trunk Assessment  Cervical / Trunk Assessment Normal  Bed Mobility  Overal bed mobility Modified Independent  General bed mobility comments increased time  Transfers  Overall transfer level Needs assistance  Equipment used Rolling walker (2 wheeled);None  Transfers Sit to/from Stand  Sit to [employer] transfer comment pt mildly usnteady with rise but no LOB with sit<>stands. completed 5x sit<>stand test and pt does demonstrate posterior lean/use legs against bed when attempting quick standing. no UE use required.  Ambulation/Gait  Ambulation/Gait assistance Min guard  Gait Distance (Feet) 300 Feet  Assistive device Rolling walker (2 wheeled);None  Gait Pattern/deviations Step-through pattern;Decreased step length - right;Decreased step length - left;Decreased stride length;Drifts right/left;Narrow base of support;Staggering left;Staggering right;Scissoring  General Gait Details unsteady gait with tendency to drift to the Rt. pt ambulating with/without RW and despite cues has poor management of RW causing more impairments than without device. Pt required min assist 2x to prevent LOB with turns. not limited by pain or fatigue despite mentions of Rt foot gout and swelling.  Gait velocity decr  Balance  Overall balance assessment Needs  assistance  Sitting-balance support No upper extremity supported;Feet supported  Sitting balance-Leahy Scale Good  Standing balance support No upper extremity supported;During functional activity;Bilateral upper extremity supported  Standing balance-Leahy Scale Poor  Single Leg Stance - Right Leg 0  Single Leg Stance - Left Leg 2  Tandem Stance - Right Leg 3  Tandem Stance - Left Leg 5  Standardized Balance Assessment  Standardized Balance Assessment  Dynamic Gait Index  Dynamic Gait Index  Level Surface 2  Change in Gait Speed 2  Gait with Horizontal Head Turns 1  Gait with Vertical Head Turns 1  Gait and Pivot Turn 2  Step Over Obstacle 1  Step Around Obstacles 1  Steps 1  Total Score 11  PT - End of Session  Equipment Utilized During Treatment Gait belt  Activity Tolerance Patient tolerated treatment well  Patient left in bed;with call bell/phone within reach;with nursing/sitter in room  Nurse Communication Mobility status  PT Assessment  PT Recommendation/Assessment Patient needs continued PT services  PT Visit Diagnosis Unsteadiness on feet (R26.81);Other abnormalities of gait and mobility (R26.89)  PT Problem List Decreased balance;Decreased knowledge of use of DME;Decreased knowledge of precautions;Decreased mobility;Decreased activity tolerance;Pain  PT Plan  PT Frequency (ACUTE ONLY) Min 3X/week  PT Treatment/Interventions (ACUTE ONLY) DME instruction;Therapeutic activities;Balance training;Cognitive remediation;Gait training;Functional mobility training;Therapeutic exercise;Neuromuscular re-education;Patient/family education  AM-PAC PT "6 Clicks" Mobility Outcome Measure (Version 2)  Help needed turning from your back to your side while in a flat bed without using bedrails? 4  Help needed moving from lying on your back to sitting on the side of a flat bed without using bedrails? 4  Help needed moving to and from a bed to a chair (including a wheelchair)? 3  Help needed  standing up from a chair using your arms (e.g., wheelchair or bedside chair)? 3  Help needed to walk  in hospital room? 3  Help needed climbing 3-5 steps with a railing?  2  6 Click Score 19  Consider Recommendation of Discharge To: Home with [facility]  Progressive Mobility  What is the highest level of mobility based on the progressive mobility assessment? Level 5 (Walks with assist in room/hall) - Balance while stepping forward/back and can walk in room with assist - Complete  Mobility Ambulated with assistance in hallway  PT Recommendation  Follow Up Recommendations Home health PT  PT equipment None recommended by PT (TBA)  Individuals Consulted  Consulted and Agree with Results and Recommendations Patient  Acute Rehab PT Goals  Patient Stated Goal Go home.  PT Goal Formulation With patient  Time For Goal Achievement 02/24/21  Potential to Achieve Goals Fair  PT Time Calculation  PT Start Time (ACUTE ONLY) 1328  PT Stop Time (ACUTE ONLY) 1346  PT Time Calculation (min) (ACUTE ONLY) 18 min  PT General Charges  $$ ACUTE PT VISIT 1 Visit  PT Evaluation  $PT Eval Moderate Complexity 1 Mod    Verner Mould, DPT Acute Rehabilitation Services Office [phone] Pager [phone]  Jacques Navy 02/10/2021, 7:02 PM

## 2021-02-10 NOTE — Progress Notes (Addendum)
Pt stated he got up to go to the bathroom and then was on his way to change the A/C. Pt stated he slipped and fell on his elbow and  pt stated he did not hit his head , then later when evaluating pt he stated he hit his head. Pt endorsing no pain, no new bruising noted . Pt Bp 153 / 86 P-58 . Pt room was noted to have water throughout the room that looked like condensation. When writer walked towards pt writer slipped several times due to the floor being wet. Pt stated he was wearing his socks when he came from the bathroom, but when staff found pt he was sitting on the floor and his socks were on the floor by the first bed and pt was by the wall of the A/C unit. NP cody called to evaluate pt, per ED doctor pt did not need to be sent out at this time , only if pt becomes symptomatic or change in pt condition / LOC. Pt condition will continue to be monitored.

## 2021-02-10 NOTE — Tx Team (Signed)
Initial Treatment Plan 02/10/2021 2:35 AM Juan Juarez [id number]    PATIENT STRESSORS: Marital or family conflict Medication change or noncompliance   PATIENT STRENGTHS: General fund of knowledge Motivation for treatment/growth   PATIENT IDENTIFIED PROBLEMS: SI  "Hook shot"  depression                 DISCHARGE CRITERIA:  Improved stabilization in mood, thinking, and/or behavior Verbal commitment to aftercare and medication compliance  PRELIMINARY DISCHARGE PLAN: Attend PHP/IOP Attend 12-step recovery group  PATIENT/FAMILY INVOLVEMENT: This treatment plan has been presented to and reviewed with the patient, Juan Juarez.  The patient and family have been given the opportunity to ask questions and make suggestions.  Providence Crosby, RN 02/10/2021, 2:35 AM

## 2021-02-10 NOTE — Progress Notes (Addendum)
Pt fell in his room trying to adjust A/C unit in his room. When staff came in the room, pt was on the floor without socks on his feet sitting on the floor facing the wall by the bench on the side of bed 2 near the A/C unit. Pt socks were by the wall next to bed 1. Pt floor was wet throughout multiple spots in the room. It was very slippery by where th pt was sitting on the floor (writer slipped numerous times wearing shoes , possibly would have fallen if not being extra careful) . Upon further inspection of pt room, there was a line of water under bed 1 that looked like condensation. Pt initially stated he did not hit his head, then he changed his story. Pt was assessed and NP-Cody was notified and pt assessed by him. [place] notified ED doctor and they recommended not to send the pt at this time, continue to monitor and send over if pt decompensated . Vitals monitored per protocol , pt evaluated and will continue to monitor. See NP-Cody Lovena Le Note    02/10/21 0105  What Happened  Was fall witnessed? No  Was patient injured? No  Patient found on floor  Found by Staff-comment ([facility] BEN)  Stated prior activity ambulating-unassisted  Follow Up  MD notified Margorie John  Time MD notified [phone]  Family notified No - patient refusal  Additional tests No  Simple treatment Other (comment) (N/A)  Progress note created (see row info) Yes  Adult Fall Risk Assessment  Risk Factor Category (scoring not indicated) High fall risk per protocol (document High fall risk);Fall has occurred during this admission (document High fall risk)  Patient Fall Risk Level High fall risk  Adult Fall Risk Interventions  Required Bundle Interventions *See Row Information* High fall risk - low, moderate, and high requirements implemented  Additional Interventions Assess orthostatic BP;Room near nurses station  Screening for Fall Injury Risk (To be completed on HIGH fall risk patients) - Assessing Need for Floor Mats  Risk For  Fall Injury- Criteria for Floor Mats Previous fall this admission;Noncompliant with safety precautions

## 2021-02-10 NOTE — Tx Team (Signed)
Interdisciplinary Treatment and Diagnostic Plan Update  02/10/2021 Time of Session: 11:05 Juan Juarez MRN: [id number]  Principal Diagnosis: <principal problem not specified>  Secondary Diagnoses: Active Problems:   MDD (major depressive disorder)   Current Medications:  Current Facility-Administered Medications  Medication Dose Route Frequency Provider Last Rate Last Admin   acetaminophen (TYLENOL) tablet 650 mg  650 mg Oral Q6H PRN Chalmers Guest, NP       alum & mag hydroxide-simeth (MAALOX/MYLANTA) 200-200-20 MG/5ML suspension 30 mL  30 mL Oral Q4H PRN Chalmers Guest, NP       cloBAZam (ONFI) tablet 10 mg  10 mg Oral QHS Margorie John W, PA-C   10 mg at 02/09/21 2129   feeding supplement (GLUCERNA SHAKE) (GLUCERNA SHAKE) liquid 237 mL  237 mL Oral BID BM Sharma Covert, MD       haloperidol (HALDOL) tablet 5 mg  5 mg Oral BID Chalmers Guest, NP   5 mg at 02/09/21 2129   hydrOXYzine (ATARAX/VISTARIL) tablet 25 mg  25 mg Oral TID PRN Chalmers Guest, NP       ibuprofen (ADVIL) tablet 800 mg  800 mg Oral Q8H Margorie John W, PA-C   800 mg at 02/10/21 0254   insulin aspart (novoLOG) injection 0-15 Units  0-15 Units Subcutaneous TID WC Chalmers Guest, NP   2 Units at 02/10/21 2706   insulin aspart (novoLOG) injection 0-5 Units  0-5 Units Subcutaneous QHS Chalmers Guest, NP       LORazepam (ATIVAN) tablet 1 mg  1 mg Oral BID PRN Chalmers Guest, NP       magnesium hydroxide (MILK OF MAGNESIA) suspension 30 mL  30 mL Oral Daily PRN Chalmers Guest, NP       pantoprazole (PROTONIX) EC tablet 40 mg  40 mg Oral Daily Chalmers Guest, NP       QUEtiapine (SEROQUEL) tablet 100 mg  100 mg Oral BID Chalmers Guest, NP   100 mg at 02/09/21 2129   PTA Medications: Medications Prior to Admission  Medication Sig Dispense Refill Last Dose   ALPRAZolam (XANAX) 0.5 MG tablet Take 0.5 mg by mouth at bedtime as needed.      blood glucose meter kit and supplies Dispense based on patient and insurance  preference. Use up to four times daily as directed. (FOR ICD-10 E10.9, E11.9). 1 each 0    cloBAZam (ONFI) 10 MG tablet clobazam 10 mg tablet  TAKE ONE TABLET BY MOUTH EVERY DAY      doxepin (SINEQUAN) 75 MG capsule Take 75 mg by mouth.      EASY COMFORT INSULIN SYRINGE 31G X 5/16" 1 ML MISC USE AS DIRECTED ONCE DAILY FOR LEVEMIR FLEXTOUCH 100 DAY SUPPLY      gabapentin (NEURONTIN) 300 MG capsule Take by mouth.      glipiZIDE (GLUCOTROL XL) 5 MG 24 hr tablet Take 5 mg by mouth daily.      LEVEMIR 100 UNIT/ML injection Inject 0.4 mLs (40 Units total) into the skin daily. 10 mL 0    metFORMIN (GLUCOPHAGE) 500 MG tablet Take 500 mg by mouth 2 (two) times daily.      QUEtiapine (SEROQUEL) 100 MG tablet Take 1 tablet (100 mg total) by mouth 2 (two) times daily. 60 tablet 0    sertraline (ZOLOFT) 100 MG tablet Take 100 mg by mouth daily.      simvastatin (ZOCOR) 10 MG tablet Take 10 mg by mouth  daily.       Patient Stressors: Marital or family conflict Medication change or noncompliance  Patient Strengths: Technical sales engineer for treatment/growth  Treatment Modalities: Medication Management, Group therapy, Case management,  1 to 1 session with clinician, Psychoeducation, Recreational therapy.   Physician Treatment Plan for Primary Diagnosis: <principal problem not specified> Long Term Goal(s):     Short Term Goals:    Medication Management: Evaluate patient's response, side effects, and tolerance of medication regimen.  Therapeutic Interventions: 1 to 1 sessions, Unit Group sessions and Medication administration.  Evaluation of Outcomes: Not Met  Physician Treatment Plan for Secondary Diagnosis: Active Problems:   MDD (major depressive disorder)  Long Term Goal(s):     Short Term Goals:       Medication Management: Evaluate patient's response, side effects, and tolerance of medication regimen.  Therapeutic Interventions: 1 to 1 sessions, Unit Group sessions and  Medication administration.  Evaluation of Outcomes: Not Met   RN Treatment Plan for Primary Diagnosis: <principal problem not specified> Long Term Goal(s): Knowledge of disease and therapeutic regimen to maintain health will improve  Short Term Goals: Ability to verbalize feelings will improve, Ability to disclose and discuss suicidal ideas, and Ability to identify and develop effective coping behaviors will improve  Medication Management: RN will administer medications as ordered by provider, will assess and evaluate patient's response and provide education to patient for prescribed medication. RN will report any adverse and/or side effects to prescribing provider.  Therapeutic Interventions: 1 on 1 counseling sessions, Psychoeducation, Medication administration, Evaluate responses to treatment, Monitor vital signs and CBGs as ordered, Perform/monitor CIWA, COWS, AIMS and Fall Risk screenings as ordered, Perform wound care treatments as ordered.  Evaluation of Outcomes: Not Met   LCSW Treatment Plan for Primary Diagnosis: <principal problem not specified> Long Term Goal(s): Safe transition to appropriate next level of care at discharge, Engage patient in therapeutic group addressing interpersonal concerns.  Short Term Goals: Engage patient in aftercare planning with referrals and resources, Increase ability to appropriately verbalize feelings, and Increase emotional regulation  Therapeutic Interventions: Assess for all discharge needs, 1 to 1 time with Social worker, Explore available resources and support systems, Assess for adequacy in community support network, Educate family and significant other(s) on suicide prevention, Complete Psychosocial Assessment, Interpersonal group therapy.  Evaluation of Outcomes: Not Met   Progress in Treatment: Attending groups: No. and As evidenced by:  pt was recently admitted Participating in groups: No. and As evidenced by:  pt was recently  admitted Taking medication as prescribed: No. Toleration medication: Yes. Family/Significant other contact made: No, will contact:  CSW will obtain consent Patient understands diagnosis: No. Discussing patient identified problems/goals with staff: Yes. Medical problems stabilized or resolved: Yes. Denies suicidal/homicidal ideation: Yes. Issues/concerns per patient self-inventory: No. Other: None  New problem(s) identified: No, Describe:  None  New Short Term/Long Term Goal(s):medication stabilization, elimination of SI thoughts, development of comprehensive mental wellness plan.   Patient Goals:  "get more exercise."   Discharge Plan or Barriers: Patient recently admitted. CSW will continue to follow and assess for appropriate referrals and possible discharge planning.   Reason for Continuation of Hospitalization: Anxiety Depression Medication stabilization Suicidal ideation  Estimated Length of Stay: 3-5 days  Attendees: Patient: Juan Dubray" Juarez 02/10/2021   Physician: Dr. Kai Levins 02/10/2021   Nursing:  02/10/2021   RN Care Manager: 02/10/2021   Social Worker: Toney Reil, Latanya Presser 02/10/2021   Recreational Therapist:  02/10/2021   Other:  02/10/2021   Other:  02/10/2021   Other: 02/10/2021     Scribe for Treatment Team: Mliss Fritz, Latanya Presser 02/10/2021 9:49 AM

## 2021-02-10 NOTE — [facility] Group Notes (Signed)
Type of Therapy and Topic: Group Therapy: Overcoming Obstacles  Participation Level: Did Not Attend  Description of Group: In this group patients will be encouraged to explore what they see as obstacles to their own wellness and recovery. They will be guided to discuss their thoughts, feelings, and behaviors related to these obstacles. The group will process together ways to cope with barriers, with attention given to specific choices patients can make. Each patient will be challenged to identify changes they are motivated to make in order to overcome their obstacles. This group will be process-oriented, with patients participating in exploration of their own experiences as well as giving and receiving support and challenge from other group members.  Therapeutic Goals: 1. Patient will identify personal and current obstacles as they relate to admission. 2. Patient will identify barriers that currently interfere with their wellness or overcoming obstacles. 3. Patient will identify feelings, thought process and behaviors related to these barriers. 4. Patient will identify two changes they are willing to make to overcome these obstacles:  Summary of Patient Progress: Did not attend

## 2021-02-11 DIAGNOSIS — F1994 Other psychoactive substance use, unspecified with psychoactive substance-induced mood disorder: Secondary | ICD-10-CM

## 2021-02-11 LAB — GLUCOSE, CAPILLARY
Glucose-Capillary: 128 mg/dL — ABNORMAL HIGH (ref 70–99)
Glucose-Capillary: 117 mg/dL — ABNORMAL HIGH (ref 70–99)
Glucose-Capillary: 137 mg/dL — ABNORMAL HIGH (ref 70–99)
Glucose-Capillary: 154 mg/dL — ABNORMAL HIGH (ref 70–99)

## 2021-02-11 LAB — AMMONIA: Ammonia: 33 umol/L (ref 9–35)

## 2021-02-11 LAB — RPR: RPR Ser Ql: NONREACTIVE

## 2021-02-11 MED ORDER — AMLODIPINE BESYLATE 5 MG PO TABS
5.0000 mg | ORAL_TABLET | Freq: Every day | ORAL | Status: DC
  Administered 2021-02-11 – 2021-02-24 (×14): 5 mg via ORAL
  Filled 2021-02-11 (×8): qty 1
  Filled 2021-02-11: qty 7
  Filled 2021-02-11 (×13): qty 1

## 2021-02-11 MED ORDER — QUETIAPINE FUMARATE 200 MG PO TABS
200.0000 mg | ORAL_TABLET | Freq: Every day | ORAL | Status: DC
  Administered 2021-02-11: 200 mg via ORAL
  Filled 2021-02-11 (×4): qty 1

## 2021-02-11 MED ORDER — HALOPERIDOL 2 MG PO TABS
2.0000 mg | ORAL_TABLET | Freq: Every day | ORAL | Status: DC
  Filled 2021-02-11 (×2): qty 1

## 2021-02-11 NOTE — Progress Notes (Signed)
   D:  Pt is asleep and resting.  No signs of distress detected. A:  Pt remains on a 1:1 observation for safety. R:  Pt remains safe on unit with Q 15 minute safety checks.

## 2021-02-11 NOTE — Progress Notes (Signed)
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  He also has a history of hepatitis C and cirrhosis.  Objective: Patient is seen and examined.  Patient is a 65 year old male with the above-stated past psychiatric history who is seen in follow-up.  He stated he feels better today.  His speech is still not great but not as slurred as it was yesterday.  He is lying in bed.  He did have a physical therapy consultation done yesterday.  His orientation is a little bit better today.  He still having problems with historical information.  Yesterday he thought that the most recent president was [name], but today he was unable to remember [name]'s name.  He denied suicidal ideation.  He continues to have musculoskeletal pain issues.  We discussed getting out of bed and trying to build up his strength so that he could show [place] that he could live independently at discharge.  No auditory or visual hallucinations.  No suicidal or homicidal ideation.  Initially this morning his blood pressure was 135/78 and pulse was 64.  Repeat was 158/87.  Pulse oximetry on room air was 100%.  He only slept 4.75 hours last night.  His most recent CIWA was 0.  Blood sugar this morning is 117.  His lipid panel showed a mildly elevated triglyceride at 160, cholesterol was 190, but the rest of his numbers were within normal limits.  TSH from 02/10/2021 was 7.090.  T4 was 0.73.  His TSH 12 days ago was 1.741.  This most likely is euthyroid sick syndrome.  His RPR was nonreactive.  EKG  showed a normal sinus rhythm with a normal QTc interval.  Principal Problem: MDD (major depressive disorder) Diagnosis: Principal Problem:   MDD (major depressive disorder)  Total Time spent with patient: 20 minutes  Past Psychiatric History: See admission H&P  Past Medical History:  Past Medical History:  Diagnosis Date   Anxiety    Chronic elbow pain, left    Depression    Diabetes mellitus, type II ([place])    type II   Gout    Head injury 12/2017   Hepatitis C without hepatic coma    Hypertension    denies   Inflammatory arthropathy    left elbow   Insomnia    Non compliance w medication regimen    Osteoarthritis    Polysubstance abuse ([place])    Prostate cancer ([place])    Protein-calorie malnutrition, severe ([place]) 09/13/2016   Seizure ([place]) 12/2017   after head Injury   Thrombocytopenia [facility])     Past Surgical History:  Procedure Laterality Date   BURR HOLE Right 01/22/2018   Procedure: RIGHT BURR HOLES;  Surgeon: Newman Pies, MD;  Location: [place];  Service: Neurosurgery;  Laterality: Right;   I & D EXTREMITY Left 11/06/2018   Procedure: IRRIGATION AND DEBRIDEMENT LEFT ELBOW JOINT;  Surgeon: Charlotte Crumb, MD;  Location: [place];  Service: Orthopedics;  Laterality: Left;   INCISION AND DRAINAGE Left 10/24/2018   Procedure: INCISION AND DRAINAGE LEFT ELBOW WITH MASS EXCISION;  Surgeon: Charlotte Crumb, MD;  Location: [place];  Service:  Orthopedics;  Laterality: Left;   MULTIPLE TOOTH EXTRACTIONS     None     Family History:  Family History  Problem Relation Age of Onset   Thyroid disease Sister    Colon cancer Neg Hx    Colon polyps Neg Hx    Breast cancer Neg Hx    Prostate cancer Neg Hx    Pancreatic cancer Neg Hx    Family Psychiatric  History: See admission H&P Social History:  Social History   Substance and Sexual Activity  Alcohol Use Not Currently   Comment: quit 2019     Social History   Substance and Sexual Activity  Drug Use Not Currently    Types: Marijuana, Cocaine   Comment: cocaine last use 10/26/2018, Marijuana sping 2019    Social History   Socioeconomic History   Marital status: Divorced    Spouse name: Not on file   Number of children: Not on file   Years of education: Not on file   Highest education level: Not on file  Occupational History   Occupation: disabled  Tobacco Use   Smoking status: Every Day    Packs/day: 0.50    Years: 50.00    Pack years: 25.00    Types: Cigarettes   Smokeless tobacco: Never  Vaping Use   Vaping Use: Never used  Substance and Sexual Activity   Alcohol use: Not Currently    Comment: quit 2019   Drug use: Not Currently    Types: Marijuana, Cocaine    Comment: cocaine last use 10/26/2018, Marijuana sping 2019   Sexual activity: Not Currently  Other Topics Concern   Not on file  Social History Narrative   Not on file   Social Determinants of Health   Financial Resource Strain: Not on file  Food Insecurity: Not on file  Transportation Needs: Not on file  Physical Activity: Not on file  Stress: Not on file  Social Connections: Not on file   Additional Social History:                         Sleep: Fair  Appetite:  Fair  Current Medications: Current Facility-Administered Medications  Medication Dose Route Frequency Provider Last Rate Last Admin   acetaminophen (TYLENOL) tablet 650 mg  650 mg Oral Q6H PRN Chalmers Guest, NP       ALPRAZolam Duanne Moron) tablet 0.5 mg  0.5 mg Oral QHS PRN Sharma Covert, MD   0.5 mg at 02/10/21 2311   alum & mag hydroxide-simeth (MAALOX/MYLANTA) 200-200-20 MG/5ML suspension 30 mL  30 mL Oral Q4H PRN Chalmers Guest, NP       amoxicillin-clavulanate (AUGMENTIN) 875-125 MG per tablet 1 tablet  1 tablet Oral Q12H Sharma Covert, MD   1 tablet at 02/11/21 0859   cloBAZam (ONFI) tablet 10 mg  10 mg Oral QHS Margorie John W, PA-C   10 mg at 02/10/21 2135   feeding supplement (GLUCERNA SHAKE) (GLUCERNA SHAKE) liquid 237 mL  237  mL Oral BID BM Sharma Covert, MD   237 mL at 02/10/21 1000   haloperidol (HALDOL) tablet 2 mg  2 mg Oral BID Sharma Covert, MD   2 mg at 02/11/21 0900   hydrOXYzine (ATARAX/VISTARIL) tablet 25 mg  25 mg Oral TID PRN Chalmers Guest, NP       insulin aspart (novoLOG) injection 0-15 Units  0-15 Units Subcutaneous TID WC Chalmers Guest, NP  2 Units at 02/11/21 0655   insulin aspart (novoLOG) injection 0-5 Units  0-5 Units Subcutaneous QHS Chalmers Guest, NP       LORazepam (ATIVAN) tablet 1 mg  1 mg Oral BID PRN Chalmers Guest, NP       magnesium hydroxide (MILK OF MAGNESIA) suspension 30 mL  30 mL Oral Daily PRN Chalmers Guest, NP       pantoprazole (PROTONIX) EC tablet 40 mg  40 mg Oral Daily Chalmers Guest, NP   40 mg at 02/11/21 0900   QUEtiapine (SEROQUEL) tablet 100 mg  100 mg Oral QHS Sharma Covert, MD   100 mg at 02/10/21 2135    Lab Results:  Results for orders placed or performed during the hospital encounter of 02/09/21 (from the past 48 hour(s))  Glucose, capillary     Status: Abnormal   Collection Time: 02/09/21  8:06 PM  Result Value Ref Range   Glucose-Capillary 129 (H) 70 - 99 mg/dL    Comment: Glucose reference range applies only to samples taken after fasting for at least 8 hours.  Glucose, capillary     Status: Abnormal   Collection Time: 02/10/21  1:51 AM  Result Value Ref Range   Glucose-Capillary 180 (H) 70 - 99 mg/dL    Comment: Glucose reference range applies only to samples taken after fasting for at least 8 hours.  Glucose, capillary     Status: Abnormal   Collection Time: 02/10/21  6:17 AM  Result Value Ref Range   Glucose-Capillary 143 (H) 70 - 99 mg/dL    Comment: Glucose reference range applies only to samples taken after fasting for at least 8 hours.  Glucose, capillary     Status: Abnormal   Collection Time: 02/10/21 12:03 PM  Result Value Ref Range   Glucose-Capillary 112 (H) 70 - 99 mg/dL    Comment: Glucose reference range applies only to  samples taken after fasting for at least 8 hours.  Glucose, capillary     Status: Abnormal   Collection Time: 02/10/21  5:02 PM  Result Value Ref Range   Glucose-Capillary 126 (H) 70 - 99 mg/dL    Comment: Glucose reference range applies only to samples taken after fasting for at least 8 hours.  Lipid panel     Status: Abnormal   Collection Time: 02/10/21  6:40 PM  Result Value Ref Range   Cholesterol 190 0 - 200 mg/dL   Triglycerides 160 (H) <150 mg/dL   HDL 26 (L) >40 mg/dL   Total CHOL/HDL Ratio 7.3 RATIO   VLDL 32 0 - 40 mg/dL   LDL Cholesterol 132 (H) 0 - 99 mg/dL    Comment:        Total Cholesterol/HDL:CHD Risk Coronary Heart Disease Risk Table                     Men   Women  1/2 Average Risk   3.4   3.3  Average Risk       5.0   4.4  2 X Average Risk   9.6   7.1  3 X Average Risk  23.4   11.0        Use the calculated Patient Ratio above and the CHD Risk Table to determine the patient's CHD Risk.        ATP III CLASSIFICATION (LDL):  <100     mg/dL   Optimal  100-129  mg/dL   Near or  Above                    Optimal  130-159  mg/dL   Borderline  160-189  mg/dL   High  >190     mg/dL   Very High Performed at [facility] [address], [place], [place] [address]   RPR     Status: None   Collection Time: 02/10/21  6:40 PM  Result Value Ref Range   RPR Ser Ql NON REACTIVE NON REACTIVE    Comment: Performed at [facility], [place] [address] [place], [place] [address]  TSH     Status: Abnormal   Collection Time: 02/10/21  6:40 PM  Result Value Ref Range   TSH 7.090 (H) 0.350 - 4.500 uIU/mL    Comment: Performed by a 3rd Generation assay with a functional sensitivity of <=0.01 uIU/mL. Performed at [facility], [place] [address], [place], [place] [address]   Comprehensive metabolic panel     Status: Abnormal   Collection Time: 02/10/21  6:40 PM  Result Value Ref Range   Sodium 140 135 - 145 mmol/L   Potassium 4.4 3.5 -  5.1 mmol/L   Chloride 107 98 - 111 mmol/L   CO2 26 22 - 32 mmol/L   Glucose, Bld 130 (H) 70 - 99 mg/dL    Comment: Glucose reference range applies only to samples taken after fasting for at least 8 hours.   BUN 22 8 - 23 mg/dL   Creatinine, Ser 1.36 (H) 0.61 - 1.24 mg/dL   Calcium 9.1 8.9 - 10.3 mg/dL   Total Protein 7.9 6.5 - 8.1 g/dL   Albumin 3.2 (L) 3.5 - 5.0 g/dL   AST 22 15 - 41 U/L   ALT 15 0 - 44 U/L   Alkaline Phosphatase 73 38 - 126 U/L   Total Bilirubin 0.7 0.3 - 1.2 mg/dL   GFR, Estimated 57 (L) >60 mL/min    Comment: (NOTE) Calculated using the CKD-EPI Creatinine Equation (2021)    Anion gap 7 5 - 15    Comment: Performed at [facility], [place] [address], [place], [place] [address]  Glucose, capillary     Status: Abnormal   Collection Time: 02/10/21  9:24 PM  Result Value Ref Range   Glucose-Capillary 144 (H) 70 - 99 mg/dL    Comment: Glucose reference range applies only to samples taken after fasting for at least 8 hours.  Glucose, capillary     Status: Abnormal   Collection Time: 02/11/21  6:18 AM  Result Value Ref Range   Glucose-Capillary 128 (H) 70 - 99 mg/dL    Comment: Glucose reference range applies only to samples taken after fasting for at least 8 hours.  Ammonia     Status: None   Collection Time: 02/11/21  7:02 AM  Result Value Ref Range   Ammonia 33 9 - 35 umol/L    Comment: Performed at [facility], [place] [address], [place], [place] [address]  Glucose, capillary     Status: Abnormal   Collection Time: 02/11/21 12:09 PM  Result Value Ref Range   Glucose-Capillary 117 (H) 70 - 99 mg/dL    Comment: Glucose reference range applies only to samples taken after fasting for at least 8 hours.    Blood Alcohol level:  Lab Results  Component Value Date   ETH <10 01/29/2021   ETH <10 123XX123    Metabolic Disorder  Labs: Lab Results  Component Value Date   HGBA1C 5.8 (H) 01/30/2021   MPG 119.76 01/30/2021   MPG  283.35 09/02/2018   No results found for: PROLACTIN Lab Results  Component Value Date   CHOL 190 02/10/2021   TRIG 160 (H) 02/10/2021   HDL 26 (L) 02/10/2021   CHOLHDL 7.3 02/10/2021   VLDL 32 02/10/2021   LDLCALC 132 (H) 02/10/2021   LDLCALC 69 11/10/2015    Physical Findings: AIMS:  , ,  ,  ,    CIWA:  CIWA-Ar Total: 0 COWS:     Musculoskeletal: Strength & Muscle Tone: decreased Gait & Station: shuffle Patient leans: N/A  Psychiatric Specialty Exam:  Presentation  General Appearance: Appropriate for Environment; Casual  Eye Contact:Good  Speech:Clear and Coherent; Normal Rate  Speech Volume:Normal  Handedness:Right   Mood and Affect  Mood:Euthymic  Affect:Appropriate; Congruent   Thought Process  Thought Processes:Coherent; Goal Directed  Descriptions of Associations:Intact  Orientation:Full (Time, Place and Person)  Thought Content:Logical  History of Schizophrenia/Schizoaffective disorder:No  Duration of Psychotic Symptoms:No data recorded Hallucinations:Hallucinations: None  Ideas of Reference:None  Suicidal Thoughts:Suicidal Thoughts: No  Homicidal Thoughts:Homicidal Thoughts: No   Sensorium  Memory:Immediate Good; Recent Good; Remote Good  Judgment:Fair  Insight:Good   Executive Functions  Concentration:Good  Attention Span:Good  [place] of Knowledge:Good  Language:Good   Psychomotor Activity  Psychomotor Activity:Psychomotor Activity: Normal   Assets  Assets:Communication Skills; Desire for Improvement; Housing; Resilience   Sleep  Sleep:Sleep: Good Number of Hours of Sleep: 5 (Patient states that he "sleeps well at home")    Physical Exam: Physical Exam Vitals and nursing note reviewed.  Constitutional:      Appearance: Normal appearance.  HENT:     Head: Normocephalic and atraumatic.  Pulmonary:     Effort: Pulmonary effort is normal.  Neurological:     Mental Status: He is alert and  oriented to person, place, and time.   Review of Systems  Constitutional:  Positive for malaise/fatigue.  Musculoskeletal:  Positive for back pain and myalgias.  Neurological:  Positive for focal weakness and weakness.  Psychiatric/Behavioral:  Positive for memory loss. The patient is nervous/anxious.   All other systems reviewed and are negative. Blood pressure (!) 158/87, pulse (!) 59, temperature 97.8 F (36.6 C), temperature source Oral, resp. rate 16, height 5' 6.75" (1.695 m), weight 83.5 kg, SpO2 100 %. Body mass index is 29.03 kg/m.   Treatment Plan Summary: Daily contact with patient to assess and evaluate symptoms and progress in treatment, Medication management, and Plan patient is seen and examined.  Patient is a 65 year old male with the above-stated past psychiatric history who is seen in follow-up.  Diagnosis: 1.  Substance-induced mood disorder 2.  History of bipolar disorder 3.  Hepatitis C 4.  Cirrhosis of the liver 5.  Diabetes mellitus type 2 6.  Recent delirious episode 7.  Metastatic prostate cancer 8.  Hypertension 9.  Mild cognitive dysfunction versus dementia 10.  History of traumatic brain injury 11.  Polysubstance use disorders 12.  Mild aortic valve stenosis 13.  Probable aspiration pneumonitis 14.  Hyperlipidemia  15.  seizure disorder 16.  COPD  Pertinent findings on examination today: 1.  Cognitive examination is slightly improved. 2.  Diabetes mellitus is stable. 3.  RPR is negative. 4.  Blood pressure is still mildly elevated. 5.  Patient denies suicidal or homicidal ideation. 6.  Patient denies auditory or visual hallucinations.  Plan: 1.  Continue Xanax 0.5  mg p.o. nightly as needed anxiety. 2.  Continue Augmentin 875 mg p.o. twice daily for aspiration pneumonitis. 3.  Continue Onfi 10 mg p.o. nightly for seizure disorder. 4.  Decrease Haldol to 2 mg p.o. nightly for psychosis most likely secondary to delirium 5.  Continue hydroxyzine  25 mg p.o. 3 times daily as needed anxiety. 6.  Continue sliding scale insulin as needed. 7.  Continue lorazepam but changed to 1 mg p.o. 3 times daily as needed a CIWA greater than 10. 8.  Continue Protonix 40 mg p.o. daily for gastric protection. 9.  Increase Seroquel to 200 mg p.o. nightly for mood stability and sleep 10.  Start amlodipine 5 mg p.o. daily for hypertension. 11.  Continue to hold glipizide for now. 12.  Doxepin has been stopped given the overdose. 13.  Disposition planning-in progress.  Sharma Covert, MD 02/11/2021, 1:49 PM

## 2021-02-11 NOTE — [facility] Group Notes (Signed)
LCSW Group Therapy Note 02/11/2021  10:00-11:00am  Type of Therapy and Topic:  Group Therapy: Anger and Commonalities  Participation Level:  Did Not Attend   Description of Group: In this group, patients initially shared an "unknown" fact about themselves and CSW led a discussion about the ways in which we have things in common without realizing it.  Patient then identified a recent time they became angry and how this yet again showed a way in which they had something in common with other patients.  We discussed possible unhealthy reactions to anger and possible healthy reactions.  We also discussed possible underlying emotions that lead to the anger.  Commonalities among group members were pointed out throughout the entirety of group.  Therapeutic Goals: Patients were asked to share something about themselves and learned that they often have things in common with other people without knowing this Patients will remember their last incident of anger and how they reacted Patients will be able to identify their reaction as healthy or unhealthy, and identify possible reactions that would have been the opposite Patients will learn that anger itself is a secondary emotion and will think about their primary emotion at the time of their last incident of anger  Summary of Patient Progress:  The patient was invited to group, did not attend.  Therapeutic Modalities:   Cognitive Behavioral Therapy  Maretta Los, LCSW 02/11/2021  9:44 AM

## 2021-02-11 NOTE — Plan of Care (Signed)
  Problem: Education: Goal: Emotional status will improve Outcome: Not Progressing Goal: Mental status will improve Outcome: Not Progressing Goal: Verbalization of understanding the information provided will improve Outcome: Not Progressing   

## 2021-02-11 NOTE — Progress Notes (Signed)
Pt awake, observed to be more alert with less confusion, bright affect and in bright spirits in comparison to this morning. Able to answer assessment questions appropriately including month & year. He's more interactive and engaged with staff. Expressed feeling about missing his family especially his grand daughter. Ambulatory to medication window for evening dose of insulin with standby assist from assigned MHT staff. PRN Tylenol 650 mg PO given at 1817 for c/o of generalized pain 6/10.   Evening medications given as ordered. Continued support and encouragement offered to pt throughout this shift. 1:1 observation maintained without falls. Assigned staff in attendance at all times.  Pt tolerates all PO intake well including medications when offered. Pt awake, sitting on bench in his room conversing with staff. Denies concerns at this time.

## 2021-02-11 NOTE — Progress Notes (Signed)
Pt asleep in bed a this time. Respirations noted and  unlabored. He appears to be in no physical distress. Safety maintained on 1:1 observation level as ordered without falls. Assigned staff in attendance at all times.

## 2021-02-11 NOTE — Progress Notes (Signed)
[place] Group Notes:  (Nursing/MHT/Case Management/Adjunct)  Date:  02/11/2021  Time:  2015  Type of Therapy:   wrap up group  Participation Level:  Active  Participation Quality:  Appropriate, Attentive, Sharing, and Supportive  Affect:  Flat  Cognitive:  Alert  Insight:  Limited  Engagement in Group:  Engaged  Modes of Intervention:  Clarification, Education, and Support  Summary of Progress/Problems: Postive thinking and positive change were discussed.   [name] 02/11/2021, 9:08 PM

## 2021-02-11 NOTE — Progress Notes (Addendum)
Pt awake, alert to self and place on initial interactions. Denies SI, HI and AVH when assessed. Reports mild in his right foot "nothing I can't handle" 3/10. Presents with fair eye contact, mild confusion, slurred speech and unsteady gait and is restless on interactions. However, pt is pleasant on interactions. Completed his self inventory sheet with assigned MHT staff assistance. Rates his hopelessness, anxiety and depression all 0/10. Reports he slept well last night with good appetite. Ambulatory in his room from bed to bathroom and back to bed. Compliant with medications when offered. Denies discomfort thus far.  1:1 observation maintained for falls without incident. All medications given as ordered with verbal education and effects monitored. Emotional support offered, pt encouraged to comply with current treatment regimen and unit routines including scheduled groups.  Pt denies concerns at this time. Required verbal redirections due to his confusion and safety needs. Pt denies withdrawal symptoms at this time. Safety maintained on unit.

## 2021-02-11 NOTE — Progress Notes (Signed)
     02/10/21 2330  Psych Admission Type (Psych Patients Only)  Admission Status Involuntary  Psychosocial Assessment  Patient Complaints Anxiety  Eye Contact Brief  Facial Expression Flat  Affect Anxious  Speech Slow  Interaction Guarded;Forwards little  Motor Activity Unsteady;Restless;Shuffling  Appearance/Hygiene Disheveled  Behavior Characteristics Impulsive;Irritable;Guarded;Resistant to care  Mood Depressed;Anxious  Thought Process  Coherency Disorganized  Content Ambivalence  Delusions None reported or observed  Perception Derealization  Hallucination None reported or observed  Judgment Impaired  Confusion Moderate  Danger to Self  Current suicidal ideation? Denies  Danger to Others  Danger to Others None reported or observed

## 2021-02-11 NOTE — Progress Notes (Signed)
  D:  Pt asleep with even and unlabored respirations. A:  Pt remains on a 1:1 observation for safety. R:  Pt is safe on unit with Q 15 minute safety checks.

## 2021-02-12 LAB — GLUCOSE, CAPILLARY
Glucose-Capillary: 156 mg/dL — ABNORMAL HIGH (ref 70–99)
Glucose-Capillary: 156 mg/dL — ABNORMAL HIGH (ref 70–99)
Glucose-Capillary: 155 mg/dL — ABNORMAL HIGH (ref 70–99)
Glucose-Capillary: 153 mg/dL — ABNORMAL HIGH (ref 70–99)

## 2021-02-12 MED ORDER — QUETIAPINE FUMARATE 300 MG PO TABS
300.0000 mg | ORAL_TABLET | Freq: Every day | ORAL | Status: DC
  Administered 2021-02-12 – 2021-02-15 (×4): 300 mg via ORAL
  Filled 2021-02-12 (×8): qty 1

## 2021-02-12 MED ORDER — LORAZEPAM 1 MG PO TABS: 1.0000 mg | ORAL_TABLET | Freq: Two times a day (BID) | ORAL | Status: DC | PRN

## 2021-02-12 NOTE — Progress Notes (Signed)
Pt observed in ambulating in hall. Denies concerns at this time. Noted with bright affect, pleasant mood but slightly confused again this evening. Couldn't remember who he actually spoke with "my daughter, I think it was my sister or my mom". Tolerated supper well. Emotional support offered. Assigned 1:1 staff in attendance for safety without falls to note thus far. Pt remains safe on unit. And is cooperative with care at this time.

## 2021-02-12 NOTE — Progress Notes (Signed)
1:1 Nursing- Late entry  D.  Pt observed in room playing cards.  Came to medication window and took night medications.  No complaints voiced, denies pain at this time.  A.  1:1 continued as ordered for Pt safety  R.  Pt remains safe on the unit

## 2021-02-12 NOTE — Plan of Care (Signed)
  Problem: Education: Goal: Knowledge of [facility] Education information/materials will improve Outcome: Not Progressing Goal: Emotional status will improve Outcome: Not Progressing Goal: Mental status will improve Outcome: Not Progressing   

## 2021-02-12 NOTE — Progress Notes (Signed)
Pt awake in his room eating snacks. Calm at this time.  Animated with less confusion this afternoon. Answered assessment questions more accurately this afternoon. Ambulatory in milieu more this afternoon with standby assist and assigned 1:1 MHT staff. Visible in dayroom for coffee with peers as well. Remains medication compliant without adverse drug reactions.  Verbal encouragement provided to pt to get out of bed, go to groups and to ambulate more. All medications given as ordered with verbal education and effects monitored. 1:1 observation maintained for falls without incident.  Pt is cooperative with care. Denies concerns thus far.

## 2021-02-12 NOTE — B[facility] Group Notes (Signed)
[facility] LCSW Group Therapy Note  02/12/2021    Type of Therapy and Topic:  Group Therapy:  Adding Supports Including Yourself  Participation Level:  Active   Description of Group:   Patients in this group were introduced to the concept that additional supports including self-support are an essential part of recovery.  Patients listed their current healthy and unhealthy supports, and discussed the difference between the two.   Several songs were played and a group discussion ensued in which patients stated they could relate to the songs which inspired them to realize they have be willing to help themselves in order to succeed, because other people cannot achieve sobriety or stability for them.  Parents were encouraged toward self-advocacy and self-support as part of their recovery.  They discussed their reactions to these songs' messages, which were positive and hopeful.  Before group ended, they identified the supports they believe they need to add to their lives to achieve their goals at discharge.   Therapeutic Goals: 1)  explain the difference between healthy and unhealthy supports and discuss what specific supports are currently in patients' lives 2)  demonstrate the importance of being a key part of one's own support system 3)  discuss the need for appropriate boundaries with supports 4)  elicit ideas from patients about supports that need to be added in order to achieve goals   Summary of Patient Progress:   The patient expressed that supports he needs to add at discharge include "I can't think right now of any."  He said that he has been alone for 27 years and it is hard to think about needing anyone after that.  He did say his children are supportive.  He participated in the group discussion.    Therapeutic Modalities:   Motivational Interviewing Activity  Maretta Los

## 2021-02-12 NOTE — Progress Notes (Signed)
  D:  Pt presents with moderate anxiety.  Pt reports, "I spoke with daughter and grand daughters today."  Pt denies SI/HI, and verbally contracts for safety.  Pt reports no AVH.  A:  Labs /Vitals monitored; Medication Education provided; Pt encouraged to communicate concerns.  R:  Pt remains on 1:1 observation.  Pt remains safe with Q 15 minutes safety checks.      02/11/21 2134  Psych Admission Type (Psych Patients Only)  Admission Status Involuntary  Psychosocial Assessment  Patient Complaints Anxiety  Eye Contact Brief  Facial Expression Flat  Affect Anxious  Speech Slow  Interaction Guarded;Forwards little  Motor Activity Unsteady;Restless;Shuffling  Appearance/Hygiene Disheveled  Behavior Characteristics Impulsive;Irritable;Guarded  Mood Depressed;Anxious  Thought Process  Coherency Disorganized  Content Ambivalence  Delusions None reported or observed  Perception Derealization  Hallucination None reported or observed  Judgment Impaired  Confusion Moderate  Danger to Self  Current suicidal ideation? Denies  Danger to Others  Danger to Others None reported or observed

## 2021-02-12 NOTE — BHH Suicide Risk Assessment (Signed)
[facility] INPATIENT:  Family/Significant Other Suicide Prevention Education  Suicide Prevention Education:  Education Completed; Juan Juarez (mother) [phone], (name of family member/significant other) has been identified by the patient as the family member/significant other with whom the patient will be residing, and identified as the person(s) who will aid the patient in the event of a mental health crisis (suicidal ideations/suicide attempt).  With written consent from the patient, the family member/significant other has been provided the following suicide prevention education, prior to the and/or following the discharge of the patient.  The suicide prevention education provided includes the following: Suicide risk factors Suicide prevention and interventions National Suicide Hotline telephone number [facility] assessment telephone number [facility] Emergency Assistance [place] and/or Residential Mobile Crisis Unit telephone number  Request made of family/significant other to: Remove weapons (e.g., guns, rifles, knives), all items previously/currently identified as safety concern.   Remove drugs/medications (over-the-counter, prescriptions, illicit drugs), all items previously/currently identified as a safety concern.  The family member/significant other verbalizes understanding of the suicide prevention education information provided.  The family member/significant other agrees to remove the items of safety concern listed above.  Mother states patient lives by himself and has no guns.  She is not sure if he can take of himself and says he has multiple medical issues, cannot take his medicine properly.  CSW explained what an assisted living is and encouraged her to talk to him about it.  She and patient's sister have been helping him but that is very limited and they think he needs significantly more.  The family did not know where he was once he left [facility], and  were glad to hear of his hospitalization here.  Berlin Hun Grossman-Orr 02/12/2021, 4:36 PM

## 2021-02-12 NOTE — Progress Notes (Addendum)
Pt awake in bed on initial contact. Presents restless with frequent shifting in bed during assessment. Observed with some mild confusion as in current events "president" but was able to give correct day of the week with prompts from Probation officer. Denies SI, HI, AVH and pain when assessed "she. Reports he slept well last night with fair appetite. Pt attended scheduled morning group with MHT assistance and has been cooperative with care and ADLs (brushed his teeth) with prompts. Verbal encouragement provided to pt to get out of bed, go to groups and to ambulate more. Emotional support offered to pt. All medications given as ordered with verbal education and effects monitored. 1:1 observation maintained for falls without incident.  Pt remains safe on unit. Tolerated breakfast, medications and fluids well when offered. Remains safe on unit. Denies discomfort at this time.

## 2021-02-12 NOTE — BHH Group Notes (Signed)
Adult Psychoeducational Group Note  Date:  02/12/2021 Time:  8:35 PM  Group Topic/Focus:  Healthy Communication:   The focus of this group is to discuss communication, barriers to communication, as well as healthy ways to communicate with others.  Participation Level:  Active  Participation Quality:  Appropriate  Affect:  Appropriate  Cognitive:  Alert  Insight: Good  Engagement in Group:  Engaged  Modes of Intervention:  Discussion  Additional Comment  Dalene Carrow 02/12/2021, 8:35 PM

## 2021-02-12 NOTE — Progress Notes (Signed)
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  He also has a history of hepatitis C and cirrhosis.  Objective: Patient is seen and examined.  Patient is a 65 year old male with the above-stated past psychiatric history who is seen in follow-up.  He continues to slowly improve.  His motor strength is improved slightly.  Staff who is with him today stated that about 60% of the time he is able to initiate some movements, but does need to be watched because of gait instability.  The patient denied complaints today.  His p.o. intake is good.  Sleep is good.  He is still mildly cognitively impaired, but he certainly has the brain trauma history as well as an abnormal CT scan of the brain that would be consistent with either mild cognitive impairment or some degree of mild dementia.  His vital signs are stable, he is afebrile.  Pulse oximetry was 99% on room air.  He slept well last night getting 6 hours.  His blood sugar this morning is 156.  No new laboratories.  Principal Problem: MDD (major depressive disorder) Diagnosis: Principal Problem:   MDD (major depressive disorder)  Total Time spent with patient: 20 minutes  Past Psychiatric History: See admission H&P  Past Medical History:  Past Medical History:  Diagnosis Date   Anxiety    Chronic elbow pain, left    Depression    Diabetes mellitus, type II ([place])    type II   Gout    Head injury 12/2017   Hepatitis C without hepatic coma    Hypertension    denies   Inflammatory  arthropathy    left elbow   Insomnia    Non compliance w medication regimen    Osteoarthritis    Polysubstance abuse ([place])    Prostate cancer ([place])    Protein-calorie malnutrition, severe ([place]) 09/13/2016   Seizure ([place]) 12/2017   after head Injury   Thrombocytopenia [facility])     Past Surgical History:  Procedure Laterality Date   BURR HOLE Right 01/22/2018   Procedure: RIGHT BURR HOLES;  Surgeon: Newman Pies, MD;  Location: [place];  Service: Neurosurgery;  Laterality: Right;   I & D EXTREMITY Left 11/06/2018   Procedure: IRRIGATION AND DEBRIDEMENT LEFT ELBOW JOINT;  Surgeon: Charlotte Crumb, MD;  Location: [place];  Service: Orthopedics;  Laterality: Left;   INCISION AND DRAINAGE Left 10/24/2018   Procedure: INCISION AND DRAINAGE LEFT ELBOW WITH MASS EXCISION;  Surgeon: Charlotte Crumb, MD;  Location: [place];  Service: Orthopedics;  Laterality: Left;   MULTIPLE TOOTH EXTRACTIONS     None     Family History:  Family History  Problem Relation Age of Onset   Thyroid disease Sister    Colon cancer Neg Hx    Colon polyps Neg Hx    Breast cancer Neg Hx    Prostate cancer Neg Hx    Pancreatic cancer Neg Hx    Family Psychiatric  History: See admission H&P Social History:  Social History   Substance and Sexual Activity  Alcohol Use Not Currently   Comment: quit 2019  Social History   Substance and Sexual Activity  Drug Use Not Currently   Types: Marijuana, Cocaine   Comment: cocaine last use 10/26/2018, Marijuana sping 2019    Social History   Socioeconomic History   Marital status: Divorced    Spouse name: Not on file   Number of children: Not on file   Years of education: Not on file   Highest education level: Not on file  Occupational History   Occupation: disabled  Tobacco Use   Smoking status: Every Day    Packs/day: 0.50    Years: 50.00    Pack years: 25.00    Types: Cigarettes   Smokeless tobacco: Never  Vaping Use   Vaping Use: Never used  Substance  and Sexual Activity   Alcohol use: Not Currently    Comment: quit 2019   Drug use: Not Currently    Types: Marijuana, Cocaine    Comment: cocaine last use 10/26/2018, Marijuana sping 2019   Sexual activity: Not Currently  Other Topics Concern   Not on file  Social History Narrative   Not on file   Social Determinants of Health   Financial Resource Strain: Not on file  Food Insecurity: Not on file  Transportation Needs: Not on file  Physical Activity: Not on file  Stress: Not on file  Social Connections: Not on file   Additional Social History:                         Sleep: Good  Appetite:  Good  Current Medications: Current Facility-Administered Medications  Medication Dose Route Frequency Provider Last Rate Last Admin   acetaminophen (TYLENOL) tablet 650 mg  650 mg Oral Q6H PRN Chalmers Guest, NP   650 mg at 02/11/21 1817   ALPRAZolam (XANAX) tablet 0.5 mg  0.5 mg Oral QHS PRN Sharma Covert, MD   0.5 mg at 02/11/21 2134   alum & mag hydroxide-simeth (MAALOX/MYLANTA) 200-200-20 MG/5ML suspension 30 mL  30 mL Oral Q4H PRN Chalmers Guest, NP       amLODipine (NORVASC) tablet 5 mg  5 mg Oral Daily Sharma Covert, MD   5 mg at 02/12/21 0827   amoxicillin-clavulanate (AUGMENTIN) 875-125 MG per tablet 1 tablet  1 tablet Oral Q12H Sharma Covert, MD   1 tablet at 02/12/21 0829   cloBAZam (ONFI) tablet 10 mg  10 mg Oral QHS Margorie John W, PA-C   10 mg at 02/11/21 2134   feeding supplement (GLUCERNA SHAKE) (GLUCERNA SHAKE) liquid 237 mL  237 mL Oral BID BM Sharma Covert, MD   237 mL at 02/12/21 0921   haloperidol (HALDOL) tablet 2 mg  2 mg Oral QHS Sharma Covert, MD       hydrOXYzine (ATARAX/VISTARIL) tablet 25 mg  25 mg Oral TID PRN Chalmers Guest, NP       insulin aspart (novoLOG) injection 0-15 Units  0-15 Units Subcutaneous TID WC Chalmers Guest, NP   3 Units at 02/12/21 1242   insulin aspart (novoLOG) injection 0-5 Units  0-5 Units Subcutaneous  QHS Chalmers Guest, NP       LORazepam (ATIVAN) tablet 1 mg  1 mg Oral BID PRN Chalmers Guest, NP       magnesium hydroxide (MILK OF MAGNESIA) suspension 30 mL  30 mL Oral Daily PRN Chalmers Guest, NP       pantoprazole (PROTONIX) EC tablet 40 mg  40 mg Oral Daily Chalmers Guest, NP   40 mg at 02/12/21 0827   QUEtiapine (SEROQUEL) tablet 200 mg  200 mg Oral QHS Sharma Covert, MD   200 mg at 02/11/21 2134    Lab Results:  Results for orders placed or performed during the hospital encounter of 02/09/21 (from the past 48 hour(s))  Glucose, capillary     Status: Abnormal   Collection Time: 02/10/21  5:02 PM  Result Value Ref Range   Glucose-Capillary 126 (H) 70 - 99 mg/dL    Comment: Glucose reference range applies only to samples taken after fasting for at least 8 hours.  Lipid panel     Status: Abnormal   Collection Time: 02/10/21  6:40 PM  Result Value Ref Range   Cholesterol 190 0 - 200 mg/dL   Triglycerides 160 (H) <150 mg/dL   HDL 26 (L) >40 mg/dL   Total CHOL/HDL Ratio 7.3 RATIO   VLDL 32 0 - 40 mg/dL   LDL Cholesterol 132 (H) 0 - 99 mg/dL    Comment:        Total Cholesterol/HDL:CHD Risk Coronary Heart Disease Risk Table                     Men   Women  1/2 Average Risk   3.4   3.3  Average Risk       5.0   4.4  2 X Average Risk   9.6   7.1  3 X Average Risk  23.4   11.0        Use the calculated Patient Ratio above and the CHD Risk Table to determine the patient's CHD Risk.        ATP III CLASSIFICATION (LDL):  <100     mg/dL   Optimal  100-129  mg/dL   Near or Above                    Optimal  130-159  mg/dL   Borderline  160-189  mg/dL   High  >190     mg/dL   Very High Performed at [facility] [address] [place], [place] [address]   RPR     Status: None   Collection Time: 02/10/21  6:40 PM  Result Value Ref Range   RPR Ser Ql NON REACTIVE NON REACTIVE    Comment: Performed at [facility], [place] [address], [place],  [place] [address]  TSH     Status: Abnormal   Collection Time: 02/10/21  6:40 PM  Result Value Ref Range   TSH 7.090 (H) 0.350 - 4.500 uIU/mL    Comment: Performed by a 3rd Generation assay with a functional sensitivity of <=0.01 uIU/mL. Performed at [facility], [place] [address] [place], [place] [address]   Comprehensive metabolic panel     Status: Abnormal   Collection Time: 02/10/21  6:40 PM  Result Value Ref Range   Sodium 140 135 - 145 mmol/L   Potassium 4.4 3.5 - 5.1 mmol/L   Chloride 107 98 - 111 mmol/L   CO2 26 22 - 32 mmol/L   Glucose, Bld 130 (H) 70 - 99 mg/dL    Comment: Glucose reference range applies only to samples taken after fasting for at least 8 hours.   BUN 22 8 - 23 mg/dL   Creatinine, Ser 1.36 (H) 0.61 - 1.24 mg/dL   Calcium 9.1 8.9 - 10.3 mg/dL  Total Protein 7.9 6.5 - 8.1 g/dL   Albumin 3.2 (L) 3.5 - 5.0 g/dL   AST 22 15 - 41 U/L   ALT 15 0 - 44 U/L   Alkaline Phosphatase 73 38 - 126 U/L   Total Bilirubin 0.7 0.3 - 1.2 mg/dL   GFR, Estimated 57 (L) >60 mL/min    Comment: (NOTE) Calculated using the CKD-EPI Creatinine Equation (2021)    Anion gap 7 5 - 15    Comment: Performed at [facility], [place] [address] [place], [place] [address]  Glucose, capillary     Status: Abnormal   Collection Time: 02/10/21  9:24 PM  Result Value Ref Range   Glucose-Capillary 144 (H) 70 - 99 mg/dL    Comment: Glucose reference range applies only to samples taken after fasting for at least 8 hours.  Glucose, capillary     Status: Abnormal   Collection Time: 02/11/21  6:18 AM  Result Value Ref Range   Glucose-Capillary 128 (H) 70 - 99 mg/dL    Comment: Glucose reference range applies only to samples taken after fasting for at least 8 hours.  Ammonia     Status: None   Collection Time: 02/11/21  7:02 AM  Result Value Ref Range   Ammonia 33 9 - 35 umol/L    Comment: Performed at [facility], [place] [address] [place],  [place] [address]  Glucose, capillary     Status: Abnormal   Collection Time: 02/11/21 12:09 PM  Result Value Ref Range   Glucose-Capillary 117 (H) 70 - 99 mg/dL    Comment: Glucose reference range applies only to samples taken after fasting for at least 8 hours.  Glucose, capillary     Status: Abnormal   Collection Time: 02/11/21  4:52 PM  Result Value Ref Range   Glucose-Capillary 137 (H) 70 - 99 mg/dL    Comment: Glucose reference range applies only to samples taken after fasting for at least 8 hours.  Glucose, capillary     Status: Abnormal   Collection Time: 02/11/21  9:18 PM  Result Value Ref Range   Glucose-Capillary 154 (H) 70 - 99 mg/dL    Comment: Glucose reference range applies only to samples taken after fasting for at least 8 hours.   Comment 1 Notify RN   Glucose, capillary     Status: Abnormal   Collection Time: 02/12/21  6:07 AM  Result Value Ref Range   Glucose-Capillary 156 (H) 70 - 99 mg/dL    Comment: Glucose reference range applies only to samples taken after fasting for at least 8 hours.   Comment 1 Notify RN   Glucose, capillary     Status: Abnormal   Collection Time: 02/12/21 12:08 PM  Result Value Ref Range   Glucose-Capillary 156 (H) 70 - 99 mg/dL    Comment: Glucose reference range applies only to samples taken after fasting for at least 8 hours.    Blood Alcohol level:  Lab Results  Component Value Date   ETH <10 01/29/2021   ETH <10 123XX123    Metabolic Disorder Labs: Lab Results  Component Value Date   HGBA1C 5.8 (H) 01/30/2021   MPG 119.76 01/30/2021   MPG 283.35 09/02/2018   No results found for: PROLACTIN Lab Results  Component Value Date   CHOL 190 02/10/2021   TRIG 160 (H) 02/10/2021   HDL 26 (L) 02/10/2021   CHOLHDL 7.3 02/10/2021   VLDL 32 02/10/2021   LDLCALC 132 (  H) 02/10/2021   LDLCALC 69 11/10/2015    Physical Findings: AIMS: Facial and Oral Movements Muscles of Facial Expression: None, normal Lips and Perioral Area: None,  normal Jaw: None, normal Tongue: None, normal,Extremity Movements Upper (arms, wrists, hands, fingers): None, normal Lower (legs, knees, ankles, toes): None, normal (Fall precaution, unsteady gait. Walks with care.), Trunk Movements Neck, shoulders, hips: None, normal, Overall Severity Severity of abnormal movements (highest score from questions above): None, normal Incapacitation due to abnormal movements: None, normal Patient's awareness of abnormal movements (rate only patient's report): No Awareness, Dental Status Current problems with teeth and/or dentures?: No Does patient usually wear dentures?: No  CIWA:  CIWA-Ar Total: 0 COWS:     Musculoskeletal: Strength & Muscle Tone: decreased Gait & Station: unsteady Patient leans: N/A  Psychiatric Specialty Exam:  Presentation  General Appearance: Appropriate for Environment; Casual  Eye Contact:Good  Speech:Clear and Coherent; Normal Rate  Speech Volume:Normal  Handedness:Right   Mood and Affect  Mood:Euthymic  Affect:Appropriate; Congruent   Thought Process  Thought Processes:Coherent; Goal Directed  Descriptions of Associations:Intact  Orientation:Full (Time, Place and Person)  Thought Content:Logical  History of Schizophrenia/Schizoaffective disorder:No  Duration of Psychotic Symptoms:No data recorded Hallucinations:No data recorded Ideas of Reference:None  Suicidal Thoughts:No data recorded Homicidal Thoughts:No data recorded  Sensorium  Memory:Immediate Good; Recent Good; Remote Good  Judgment:Fair  Insight:Good   Executive Functions  Concentration:Good  Attention Span:Good  [place] of Knowledge:Good  Language:Good   Psychomotor Activity  Psychomotor Activity: No data recorded  Assets  Assets:Communication Skills; Desire for Improvement; Housing; Resilience   Sleep  Sleep: No data recorded   Physical Exam: Physical Exam Vitals and nursing note reviewed.   Constitutional:      Appearance: Normal appearance.  HENT:     Head: Normocephalic and atraumatic.  Pulmonary:     Effort: Pulmonary effort is normal.  Neurological:     General: No focal deficit present.     Mental Status: He is alert and oriented to person, place, and time.   Review of Systems  Constitutional:  Positive for malaise/fatigue.  Musculoskeletal:  Positive for back pain and myalgias.  Neurological:  Positive for seizures and weakness.  All other systems reviewed and are negative. Blood pressure 138/75, pulse 68, temperature 97.9 F (36.6 C), temperature source Oral, resp. rate 16, height 5' 6.75" (1.695 m), weight 83.5 kg, SpO2 99 %. Body mass index is 29.03 kg/m.   Treatment Plan Summary: Daily contact with patient to assess and evaluate symptoms and progress in treatment, Medication management, and Plan patient is seen and examined.  Patient is a 65 year old male with the above-stated past psychiatric history who is seen in follow-up.  Diagnosis: 1.  Substance-induced mood disorder 2.  History of bipolar disorder 3.  Hepatitis C 4.  Cirrhosis of the liver 5.  Diabetes mellitus type 2 6.  Recent delirious episode 7.  Metastatic prostate cancer 8.  Hypertension 9.  Mild cognitive dysfunction versus dementia 10.  History of traumatic brain injury 11.  Polysubstance use disorders 12.  Mild aortic valve stenosis 13.  Probable aspiration pneumonitis 14.  Hyperlipidemia  15.  seizure disorder 16.  COPD  Pertinent findings on examination today: 1.  Cognitive examination is slightly improved from admission. 2.  Diabetes mellitus is stable. 3.  Motor deficits are essentially unchanged, and he still requires one-to-one 4.  Blood pressure is improved. 5.  Patient denies suicidal or homicidal ideation. 6.  Patient denies auditory or visual hallucinations.  Plan: 1.  Stop Xanax 2.  Continue Augmentin 875 mg p.o. twice daily for aspiration pneumonitis. 3.   Continue Onfi 10 mg p.o. nightly for seizure disorder. 4.  Stop Haldol 5.  Continue hydroxyzine 25 mg p.o. 3 times daily as needed anxiety. 6.  Continue sliding scale insulin as needed. 7.  Continue lorazepam but only for significant agitation.  Stop for CIWA. 8.  Continue Protonix 40 mg p.o. daily for gastric protection. 9.  Continue Seroquel to 200 mg p.o. nightly for mood stability and sleep 10.  Continue amlodipine 5 mg p.o. daily for hypertension. 11.  Continue to hold glipizide for now. 12.  Doxepin has been stopped given the overdose. 13.  Disposition planning-in progress. Sharma Covert, MD 02/12/2021, 1:26 PM

## 2021-02-13 ENCOUNTER — Other Ambulatory Visit: Payer: Self-pay

## 2021-02-13 ENCOUNTER — Encounter (HOSPITAL_COMMUNITY): Payer: Self-pay | Admitting: Emergency Medicine

## 2021-02-13 ENCOUNTER — Inpatient Hospital Stay (HOSPITAL_COMMUNITY): Payer: Medicare Other

## 2021-02-13 LAB — GLUCOSE, CAPILLARY
Glucose-Capillary: 140 mg/dL — ABNORMAL HIGH (ref 70–99)
Glucose-Capillary: 131 mg/dL — ABNORMAL HIGH (ref 70–99)
Glucose-Capillary: 126 mg/dL — ABNORMAL HIGH (ref 70–99)
Glucose-Capillary: 163 mg/dL — ABNORMAL HIGH (ref 70–99)

## 2021-02-13 LAB — CBC
HCT: 37 % — ABNORMAL LOW (ref 39.0–52.0)
Hemoglobin: 12.7 g/dL — ABNORMAL LOW (ref 13.0–17.0)
MCH: 31.5 pg (ref 26.0–34.0)
MCHC: 34.3 g/dL (ref 30.0–36.0)
MCV: 91.8 fL (ref 80.0–100.0)
Platelets: 124 10*3/uL — ABNORMAL LOW (ref 150–400)
RBC: 4.03 MIL/uL — ABNORMAL LOW (ref 4.22–5.81)
RDW: 14.2 % (ref 11.5–15.5)
WBC: 7.8 10*3/uL (ref 4.0–10.5)
nRBC: 0 % (ref 0.0–0.2)

## 2021-02-13 LAB — TROPONIN I (HIGH SENSITIVITY)
Troponin I (High Sensitivity): 4 ng/L (ref <18)
Troponin I (High Sensitivity): 3 ng/L (ref <18)

## 2021-02-13 LAB — BASIC METABOLIC PANEL
Anion gap: 8 (ref 5–15)
BUN: 26 mg/dL — ABNORMAL HIGH (ref 8–23)
CO2: 22 mmol/L (ref 22–32)
Calcium: 8.8 mg/dL — ABNORMAL LOW (ref 8.9–10.3)
Chloride: 105 mmol/L (ref 98–111)
Creatinine, Ser: 1.13 mg/dL (ref 0.61–1.24)
GFR, Estimated: >60 mL/min (ref >60)
Glucose, Bld: 163 mg/dL — ABNORMAL HIGH (ref 70–99)
Potassium: 4.4 mmol/L (ref 3.5–5.1)
Sodium: 135 mmol/L (ref 135–145)

## 2021-02-13 MED ORDER — LORAZEPAM 1 MG PO TABS: 1.0000 mg | ORAL_TABLET | Freq: Every day | ORAL | Status: DC | PRN

## 2021-02-13 MED ORDER — NICOTINE 21 MG/24HR TD PT24
21.0000 mg | MEDICATED_PATCH | Freq: Every day | TRANSDERMAL | Status: DC
  Administered 2021-02-17 – 2021-02-19 (×3): 21 mg via TRANSDERMAL
  Filled 2021-02-13 (×17): qty 1

## 2021-02-13 NOTE — ED Triage Notes (Signed)
Pt arrive by [facility] from [facility] for c/o cp that started at 1830. Pt IV on BH for SI with attempt to OD on medication. 324 mg ASA given by EMS pta.

## 2021-02-13 NOTE — Discharge Instructions (Addendum)
Please continue to monitor your symptoms closely.  If you develop any new or worsening symptoms please come back to the emergency department.

## 2021-02-13 NOTE — ED Provider Notes (Signed)
Emergency Medicine Provider Triage Evaluation Note  Juan Juarez , a 65 y.o. male  was evaluated in triage.  Pt states his chest felt funny earlier today and he had sob.  Review of Systems  Positive: Chest discomfort, sob Negative: cough  Physical Exam  Ht '5\' 6"'$  (1.676 m)   Wt 83.5 kg   BMI 29.71 kg/m  Gen:   Awake, no distress   Resp:  Normal effort  MSK:   Moves extremities without difficulty  Other:  Heart rrr, lungs ctab, no leg edema  Medical Decision Making  Medically screening exam initiated at 8:03 PM.  Appropriate orders placed.  Juan Juarez was informed that the remainder of the evaluation will be completed by another provider, this initial triage assessment does not replace that evaluation, and the importance of remaining in the ED until their evaluation is complete.     Rodney Booze, PA-C 02/13/21 2004    Deno Etienne, DO 02/13/21 2301

## 2021-02-13 NOTE — BHH Group Notes (Signed)
Occupational Therapy Group Note Date: 02/13/2021 Group Topic/Focus: Sleep Hygiene  Group Description: Group encouraged increased engagement and participation through group discussion focused on Sleep Hygiene. Patients were encouraged to share their own concerns and difficulties with getting a healthy night's rest. Patients were then provided education and resources on habits/routines to maintain healthy sleep hygiene.  Therapeutic Goal(s): Identify and share any current issues with sleep routine and overall sleep hygiene Identify strategies, both environmental and lifestyle, that could help to improve overall sleep quality Participation Level: Active   Participation Quality: Independent   Behavior: Calm and Cooperative   Speech/Thought Process: Focused   Affect/Mood: Euthymic   Insight: Fair   Judgement: Limited   Individualization: Juan Juarez was active in their participation of group discussion/activity. Pt identified taking Xanax, which he reports "puts me right to sleep", however per chart, has not been prescribed/used since July. Appeared somewhat receptive to use of sleep hygiene strategies reviewed.   Modes of Intervention: Discussion and Education  Patient Response to Interventions:  Attentive, Engaged, and Receptive   Plan: Continue to engage patient in OT groups 2 - 3x/week.  02/13/2021  Ponciano Ort, MOT, OTR/L

## 2021-02-13 NOTE — ED Provider Notes (Signed)
[facility] EMERGENCY DEPARTMENT Provider Note   CSN: [id number] Arrival date & time: 02/13/21  2005     History Chief Complaint  Patient presents with   MDD   Chest Pain    Juan Juarez is a 65 y.o. male.  HPI Patient is a 65 year old male with a medical history as noted below.  Patient presents to the emergency department under IVC from behavioral health after a recent suicide attempt.  While at behavioral health the day he states he began experiencing an episode of achy/stabbing left-sided chest pain.  Symptoms started around 6 PM and he states they were constant and resolved about 1 hour later.  States that he burped prior to his symptoms improving.  Reports some mild diaphoresis but no nausea, vomiting, diarrhea.  Patient states that he is asymptomatic at this time.    Past Medical History:  Diagnosis Date   Anxiety    Chronic elbow pain, left    Depression    Diabetes mellitus, type II ([place])    type II   Gout    Head injury 12/2017   Hepatitis C without hepatic coma    Hypertension    denies   Inflammatory arthropathy    left elbow   Insomnia    Non compliance w medication regimen    Osteoarthritis    Polysubstance abuse ([place])    Prostate cancer ([place])    Protein-calorie malnutrition, severe ([place]) 09/13/2016   Seizure ([place]) 12/2017   after head Injury   Thrombocytopenia [facility])     Patient Active Problem List   Diagnosis Date Noted   MDD (major depressive disorder) 02/09/2021   AKI (acute kidney injury) ([place])    Acute respiratory failure with hypoxia ([place]) 01/30/2021   Intentional overdose of drug in tablet form ([place]) 01/30/2021   Hypothermia 01/30/2021   Bradycardia 01/30/2021   Hyperlipidemia 01/30/2021   Suicidal ideation 01/30/2021   Acute encephalopathy 01/30/2021   Malignant neoplasm of prostate ([place]) 10/18/2020   Pain 04/06/2019   Septic arthritis ([place]) 01/01/2019   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[place]) 11/05/2018   Hyperosmolar  (nonketotic) coma ([place]) 09/02/2018   Type 2 diabetes mellitus with hyperglycemia, with long-term current use of insulin ([place]) 09/02/2018   Status epilepticus ([place]) 01/22/2018   Macrocytic anemia 01/22/2018   Seizure ([place]) 01/22/2018   Subdural hematoma ([place]) 12/31/2017   Chronic hepatitis C without hepatic coma ([place]) 67/34/1938   Alcoholic cirrhosis of liver without ascites ([place]) 12/31/2017   Hyperammonemia ([place]) 11/09/2017   Diabetic hyperosmolar non-ketotic state ([place]) 11/07/2017   Osteoarthritis 11/07/2017   Tobacco abuse 11/07/2017   Hepatic cirrhosis ([place]) 10/23/2017   Splenomegaly    Hypoalbuminemia due to protein-calorie malnutrition ([place])    Cocaine abuse ([place]) 05/01/2017   Hyperglycemia due to diabetes mellitus ([place]) 09/13/2016   Polysubstance abuse ([place]) 09/13/2016   Elevated LFTs 09/13/2016   Protein-calorie malnutrition, severe ([place]) 09/13/2016   Depression 09/13/2016   Thrombocytopenia ([place]) 09/13/2016   Diabetes mellitus with hyperglycemia ([place]) 05/13/2015   Essential hypertension, benign 05/13/2015   Smoker 05/13/2015   Back pain at L4-L5 level 11/05/2013   Neck pain 11/05/2013   Cervical spondylosis 11/05/2013   Spinal stenosis of lumbar region 11/05/2013    Past Surgical History:  Procedure Laterality Date   BURR HOLE Right 01/22/2018   Procedure: RIGHT BURR HOLES;  Surgeon: Newman Pies, MD;  Location: [facility];  Service: Neurosurgery;  Laterality: Right;   I & D EXTREMITY Left 11/06/2018   Procedure:  IRRIGATION AND DEBRIDEMENT LEFT ELBOW JOINT;  Surgeon: [place] Crumb, MD;  Location: [facility];  Service: Orthopedics;  Laterality: Left;   INCISION AND DRAINAGE Left 10/24/2018   Procedure: INCISION AND DRAINAGE LEFT ELBOW WITH MASS EXCISION;  Surgeon: [place] Crumb, MD;  Location: [facility];  Service: Orthopedics;  Laterality: Left;   MULTIPLE TOOTH EXTRACTIONS     None         Family History  Problem Relation Age of Onset   Thyroid disease Sister     Colon cancer Neg Hx    Colon polyps Neg Hx    Breast cancer Neg Hx    Prostate cancer Neg Hx    Pancreatic cancer Neg Hx     Social History   Tobacco Use   Smoking status: Every Day    Packs/day: 0.50    Years: 50.00    Pack years: 25.00    Types: Cigarettes   Smokeless tobacco: Never  Vaping Use   Vaping Use: Never used  Substance Use Topics   Alcohol use: Not Currently    Comment: quit 2019   Drug use: Not Currently    Types: Marijuana, Cocaine    Comment: cocaine last use 10/26/2018, Marijuana sping 2019    Home Medications Prior to Admission medications   Medication Sig Start Date End Date Taking? Authorizing Provider  ALPRAZolam Duanne Moron) 0.5 MG tablet Take 0.5 mg by mouth at bedtime as needed. 06/10/19   [provider]  blood glucose meter kit and supplies Dispense based on patient and insurance preference. Use up to four times daily as directed. (FOR ICD-10 E10.9, E11.9). 09/03/18   Johnson, Clanford L, MD  cloBAZam (ONFI) 10 MG tablet clobazam 10 mg tablet  TAKE ONE TABLET BY MOUTH EVERY DAY    [provider]  doxepin (SINEQUAN) 75 MG capsule Take 75 mg by mouth.    [provider]  EASY COMFORT INSULIN SYRINGE 31G X 5/16" 1 ML MISC USE AS DIRECTED ONCE DAILY FOR LEVEMIR [place] 100 DAY SUPPLY 08/15/20   [provider]  gabapentin (NEURONTIN) 300 MG capsule Take by mouth. 04/01/19   [provider]  glipiZIDE (GLUCOTROL XL) 5 MG 24 hr tablet Take 5 mg by mouth daily. 06/10/19   [provider]  LEVEMIR 100 UNIT/ML injection Inject 0.4 mLs (40 Units total) into the skin daily. 09/03/18   Johnson, Clanford L, MD  metFORMIN (GLUCOPHAGE) 500 MG tablet Take 500 mg by mouth 2 (two) times daily. 10/02/18   [provider]  QUEtiapine (SEROQUEL) 100 MG tablet Take 1 tablet (100 mg total) by mouth 2 (two) times daily. 02/07/21 03/09/21  Manuella Ghazi, Pratik D, DO  sertraline (ZOLOFT) 100 MG tablet Take 100 mg by mouth daily. 10/08/19    [provider]  simvastatin (ZOCOR) 10 MG tablet Take 10 mg by mouth daily. 06/10/19   [provider]    Allergies    Patient has no known allergies.  Review of Systems   Review of Systems  All other systems reviewed and are negative. Ten systems reviewed and are negative for acute change, except as noted in the HPI.   Physical Exam Updated Vital Signs BP (!) 145/77 (BP Location: Right Arm)   Pulse 70   Temp 98 F (36.7 C) (Oral)   Resp 18   Ht 5' 6.75" (1.695 m)   Wt 83.5 kg   SpO2 100%   BMI 29.03 kg/m   Physical Exam Vitals and nursing note reviewed.  Constitutional:      General: He is not in acute distress.    Appearance: Normal appearance. He is well-developed. He is not ill-appearing, toxic-appearing or diaphoretic.  HENT:     Head: Normocephalic and atraumatic.     Right Ear: External ear normal.     Left Ear: External ear normal.     Nose: Nose normal.     Mouth/Throat:     Mouth: Mucous membranes are moist.     Pharynx: Oropharynx is clear. No oropharyngeal exudate or posterior oropharyngeal erythema.  Eyes:     Extraocular Movements: Extraocular movements intact.  Cardiovascular:     Rate and Rhythm: Normal rate and regular rhythm. No extrasystoles are present.    Chest Wall: PMI is not displaced.     Pulses: Normal pulses.          Dorsalis pedis pulses are 2+ on the right side and 2+ on the left side.       Posterior tibial pulses are 2+ on the right side and 2+ on the left side.     Heart sounds: Normal heart sounds. Heart sounds not distant. No murmur heard. No systolic murmur is present.  No diastolic murmur is present.    No friction rub. No gallop. No S3 or S4 sounds.  Pulmonary:     Effort: Pulmonary effort is normal. No tachypnea, accessory muscle usage or respiratory distress.     Breath sounds: Normal breath sounds. No stridor. No decreased breath sounds, wheezing, rhonchi or rales.  Chest:     Chest wall: No tenderness.      Comments: No tenderness appreciated along the anterior chest wall. Abdominal:     General: Abdomen is flat.     Tenderness: There is no abdominal tenderness.  Musculoskeletal:        General: Normal range of motion.     Cervical back: Normal range of motion and neck supple. No tenderness.     Right lower leg: No edema.     Left lower leg: No edema.  Skin:    General: Skin is warm and dry.  Neurological:     General: No focal deficit present.     Mental Status: He is alert and oriented to person, place, and time.  Psychiatric:        Mood and Affect: Mood normal.        Behavior: Behavior normal.   ED Results / Procedures / Treatments   Labs (all labs ordered are listed, but only abnormal results are displayed) Labs Reviewed  GLUCOSE, CAPILLARY - Abnormal; Notable for the following components:      Result Value   Glucose-Capillary 129 (*)    All other components within normal limits  GLUCOSE, CAPILLARY - Abnormal; Notable for the following components:   Glucose-Capillary 180 (*)    All other components within normal limits  GLUCOSE, CAPILLARY - Abnormal; Notable for the following components:   Glucose-Capillary 143 (*)    All other components within normal limits  LIPID PANEL - Abnormal; Notable for the following components:   Triglycerides 160 (*)    HDL 26 (*)    LDL Cholesterol 132 (*)    All other components within normal limits  TSH - Abnormal; Notable for the following components:   TSH 7.090 (*)    All other components within normal limits  COMPREHENSIVE METABOLIC PANEL - Abnormal; Notable for the following components:   Glucose, Bld 130 (*)    Creatinine, Ser 1.36 (*)  Albumin 3.2 (*)    GFR, Estimated 57 (*)    All other components within normal limits  GLUCOSE, CAPILLARY - Abnormal; Notable for the following components:   Glucose-Capillary 112 (*)    All other components within normal limits  GLUCOSE, CAPILLARY - Abnormal; Notable for the following  components:   Glucose-Capillary 126 (*)    All other components within normal limits  GLUCOSE, CAPILLARY - Abnormal; Notable for the following components:   Glucose-Capillary 144 (*)    All other components within normal limits  GLUCOSE, CAPILLARY - Abnormal; Notable for the following components:   Glucose-Capillary 128 (*)    All other components within normal limits  GLUCOSE, CAPILLARY - Abnormal; Notable for the following components:   Glucose-Capillary 117 (*)    All other components within normal limits  GLUCOSE, CAPILLARY - Abnormal; Notable for the following components:   Glucose-Capillary 137 (*)    All other components within normal limits  GLUCOSE, CAPILLARY - Abnormal; Notable for the following components:   Glucose-Capillary 154 (*)    All other components within normal limits  GLUCOSE, CAPILLARY - Abnormal; Notable for the following components:   Glucose-Capillary 156 (*)    All other components within normal limits  GLUCOSE, CAPILLARY - Abnormal; Notable for the following components:   Glucose-Capillary 156 (*)    All other components within normal limits  GLUCOSE, CAPILLARY - Abnormal; Notable for the following components:   Glucose-Capillary 155 (*)    All other components within normal limits  GLUCOSE, CAPILLARY - Abnormal; Notable for the following components:   Glucose-Capillary 153 (*)    All other components within normal limits  GLUCOSE, CAPILLARY - Abnormal; Notable for the following components:   Glucose-Capillary 140 (*)    All other components within normal limits  GLUCOSE, CAPILLARY - Abnormal; Notable for the following components:   Glucose-Capillary 131 (*)    All other components within normal limits  GLUCOSE, CAPILLARY - Abnormal; Notable for the following components:   Glucose-Capillary 126 (*)    All other components within normal limits  GLUCOSE, CAPILLARY - Abnormal; Notable for the following components:   Glucose-Capillary 163 (*)    All  other components within normal limits  RPR  AMMONIA  TROPONIN I (HIGH SENSITIVITY)  TROPONIN I (HIGH SENSITIVITY)  TROPONIN I (HIGH SENSITIVITY)   EKG EKG Interpretation  Date/Time:  Monday February 13 2021 19:54:27 EDT Ventricular Rate:  71 PR Interval:  186 QRS Duration: 88 QT Interval:  402 QTC Calculation: 436 R Axis:   -22 Text Interpretation: Normal sinus rhythm Minimal voltage criteria for LVH, may be normal variant ( R in aVL ) Cannot rule out Anterior infarct , age undetermined Abnormal ECG Confirmed by Lacretia Leigh ([id number]) on 02/13/2021 11:19:06 PM  Radiology DG Chest 1 View  Result Date: 02/13/2021 CLINICAL DATA:  Chest pain, shortness of breath EXAM: CHEST  1 VIEW COMPARISON:  02/02/2021 FINDINGS: Lungs are clear.  No pleural effusion or pneumothorax. The heart is normal in size. IMPRESSION: No evidence of acute cardiopulmonary disease. Electronically Signed   By: Julian Hy M.D.   On: 02/13/2021 20:48    Procedures Procedures   Medications Ordered in ED Medications  acetaminophen (TYLENOL) tablet 650 mg (650 mg Oral Given 02/11/21 1817)  alum & mag hydroxide-simeth (MAALOX/MYLANTA) 200-200-20 MG/5ML suspension 30 mL (has no administration in time range)  magnesium hydroxide (MILK OF MAGNESIA) suspension 30 mL (has no administration in time range)  hydrOXYzine (ATARAX/VISTARIL) tablet 25 mg (  Oral Canceled Entry 02/12/21 2134)  pantoprazole (PROTONIX) EC tablet 40 mg (40 mg Oral Given 02/13/21 0816)  cloBAZam (ONFI) tablet 10 mg (10 mg Oral Given 02/12/21 2134)  feeding supplement (GLUCERNA SHAKE) (GLUCERNA SHAKE) liquid 237 mL (237 mLs Oral Given 02/13/21 1605)  amoxicillin-clavulanate (AUGMENTIN) 875-125 MG per tablet 1 tablet (1 tablet Oral Given 02/13/21 0816)  amLODipine (NORVASC) tablet 5 mg (5 mg Oral Given 02/13/21 0816)  QUEtiapine (SEROQUEL) tablet 300 mg (300 mg Oral Given 02/12/21 2134)  nicotine (NICODERM CQ - dosed in mg/24 hours) patch 21 mg (21 mg  Transdermal Not Given 02/13/21 0819)  LORazepam (ATIVAN) tablet 1 mg (has no administration in time range)  ibuprofen (ADVIL) tablet 800 mg (800 mg Oral Given 02/10/21 1450)  ibuprofen (ADVIL) tablet 800 mg (800 mg Oral Given 02/10/21 2311)    ED Course  I have reviewed the triage vital signs and the nursing notes.  Pertinent labs & imaging results that were available during my care of the patient were reviewed by me and considered in my medical decision making (see chart for details).    MDM Rules/Calculators/A&P                          Pt is a 65 y.o. male who presents to the emergency department due to an episode of chest pain that occurred about 4 hours ago.  Symptoms have since resolved.  Patient currently asymptomatic.  Labs: CBC with an RBC of 4.03, hemoglobin of 12.7, hematocrit of 37, platelets of 124. BMP with a glucose of 163, BUN of 26, calcium of 8.8. Troponin of 4 with repeat of 3.  Imaging: Chest x-ray shows no evidence of acute cardiopulmonary disease.  I, Rayna Sexton, PA-C, personally reviewed and evaluated these images and lab results as part of my medical decision-making.  Patient had an episode of chest pain around 6 PM tonight.  States that his sx have since resolved.  On my exam patient now asymptomatic.  Reassuring troponins, chest x-ray, and ECG.  Doubt ACS at this time.  Feel the patient is stable for discharge at this time and he is agreeable.  Patient understands to come back to the emergency department with any new or worsening symptoms.  Will transfer back to behavioral health at this time.  Note: Portions of this report may have been transcribed using voice recognition software. Every effort was made to ensure accuracy; however, inadvertent computerized transcription errors may be present.   Final Clinical Impression(s) / ED Diagnoses Final diagnoses:  Chest pain, unspecified type   Rx / DC Orders ED Discharge Orders     None         Rayna Sexton, PA-C 02/13/21 2322    Lacretia Leigh, MD 02/15/21 1721

## 2021-02-13 NOTE — Progress Notes (Signed)
Pt left with EMS to [facility] ED for chest pain with 1:1 sitter at 17:19. EMS was given report. Pt c/o chest pain at approximately 1829, vital signs, CBG and EKGs were completed and reported to Dr. Mallie Darting and NP on site was notified and saw pt. Dr Mallie Darting instructed pt be sent out for evaluation. Charge RN was aware and [facility] also aware. Reported this off to oncoming shift RN.   Prior to this pt has been on a 1:1 for fall safety. Pt has been pleasant, cooperative, confused at times, medication compliant, appetite good. Pt has been wearing his gait belt. Pt spent some time in the dayroom, and attended one group.

## 2021-02-13 NOTE — Progress Notes (Signed)
Prior to this pt has been on a 1:1 for fall safety. Pt has been pleasant, cooperative, confused at times, medication compliant, appetite good. Will continue to monitor pt per 1:1 for safety, Q15 minute checks, and monitor for progress.

## 2021-02-13 NOTE — Progress Notes (Signed)
Pt has been on a 1:1 for fall prevention and safety. PT has been calm, cooperative, pleasant, denies SI/HI/AVH. Pt is confused. Pt ate lunch, good appetite, talked on the phone. Pt denies pain or discomfort. Will continue to monitor pt per Q15 minute face checks and monitor for safety per 1:1.

## 2021-02-13 NOTE — Progress Notes (Signed)
Nursing 1:1 Note  D.  Pt was up during the night asking for cigarette and stating that he had to go out to smoke.    A.  Upon review of his chart it was noted that he had been and every day smoker, so order put in for nicotine patch and patch placed on Pt.     R. Pt returned to bed and did go back to sleep.  1:1 continued as ordered for Pt safety.

## 2021-02-13 NOTE — [facility]HH Group Notes (Signed)
Type of Therapy:  Group Therapy: All About Me   Participation Level:  Active  Summary of Progress/Problems: Pt attended group band was appropriate.   Mliss Fritz, LCSWA

## 2021-02-13 NOTE — Progress Notes (Signed)
Spiritual care group on grief and loss facilitated by chaplain Jerene Pitch MDiv, BCC  Group Goal:  Support / Education around grief and loss Members engage in facilitated group support and psycho-social education.  Group Description:  Following introductions and group rules, group members engaged in facilitated group dialog and support around topic of loss, with particular support around experiences of loss in their lives. Group Identified types of loss (relationships / self / things) and identified patterns, circumstances, and changes that precipitate losses. Reflected on thoughts / feelings around loss, normalized grief responses, and recognized variety in grief experience.   Group noted Worden's four tasks of grief in discussion.  Group drew on Adlerian / Rogerian, narrative, MI, Patient Progress:  Present throughout group.

## 2021-02-13 NOTE — ED Provider Notes (Signed)
Emergency Medicine Provider Triage Evaluation Note  Juan Juarez , a 65 y.o. male  was evaluated in triage.  Pt complains of chest discomfort and sob that started earlier today.  Review of Systems  Positive: Cp, sob Negative: fever  Physical Exam  BP (!) 145/77 (BP Location: Right Arm)   Pulse 70   Temp 98 F (36.7 C) (Oral)   Resp 18   Ht 5' 6.75" (1.695 m)   Wt 83.5 kg   SpO2 100%   BMI 29.03 kg/m  Gen:   Awake, no distress   Resp:  Normal effort  MSK:   Moves extremities without difficulty  Other:  Lungs ctab, heart rrr  Medical Decision Making  Medically screening exam initiated at 8:17 PM.  Appropriate orders placed.  Evie Wiltsie was informed that the remainder of the evaluation will be completed by another provider, this initial triage assessment does not replace that evaluation, and the importance of remaining in the ED until their evaluation is complete.     Rodney Booze, PA-C 02/13/21 2018    Deno Etienne, DO 02/13/21 2301

## 2021-02-13 NOTE — Progress Notes (Signed)
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  He also has a history of hepatitis C and cirrhosis.  Objective: Patient is seen and examined.  Patient is a 65 year old male with the above-stated past psychiatric history who is seen in follow-up.  He stated today he feels better than he has in a couple years.  He does look better.  He still requires some assistance with walking.  He is much more stable than he had been.  Since he has been here we have been able to get rid of the Haldol as well as the Xanax.  We increased his Seroquel dosage and his sleep is doing well with that.  His slurred speech has improved.  He stated he spoke with his mother and his sister last night and they both commented that he sounded better than he has in quite a while.  He denied any auditory or visual hallucinations.  He denied any suicidal or homicidal ideation.  He is alert and oriented x3 today without difficulty, and he was able to remember the president's more rapidly today than he has so far.  It should be noted that his TSH from 02/10/2021 was 7.090 and his T4 was 0.73.  I felt as though he probably had euthyroid sick syndrome because his previous TSHs were all normal.  His blood sugar is 131 today.  Early this a.m. his blood pressure was 112/80 and repeat was 116/75.  Pulse was between 77 and 78.  Pulse oximetry on room air was 97%.  Nurses notes reflect he slept 4.5 hours, but he felt as though he slept more than that.  Principal Problem: MDD (major  depressive disorder) Diagnosis: Principal Problem:   MDD (major depressive disorder)  Total Time spent with patient: 20 minutes  Past Psychiatric History: See admission H&P  Past Medical History:  Past Medical History:  Diagnosis Date   Anxiety    Chronic elbow pain, left    Depression    Diabetes mellitus, type II ([place])    type II   Gout    Head injury 12/2017   Hepatitis C without hepatic coma    Hypertension    denies   Inflammatory arthropathy    left elbow   Insomnia    Non compliance w medication regimen    Osteoarthritis    Polysubstance abuse ([place])    Prostate cancer ([place])    Protein-calorie malnutrition, severe ([place]) 09/13/2016   Seizure ([place]) 12/2017   after head Injury   Thrombocytopenia [facility])     Past Surgical History:  Procedure Laterality Date   BURR HOLE Right 01/22/2018   Procedure: RIGHT BURR HOLES;  Surgeon: Newman Pies, MD;  Location: [facility];  Service: Neurosurgery;  Laterality: Right;   I & D EXTREMITY Left 11/06/2018   Procedure: IRRIGATION AND DEBRIDEMENT LEFT ELBOW JOINT;  Surgeon: Charlotte Crumb, MD;  Location: [place];  Service: Orthopedics;  Laterality: Left;   INCISION AND DRAINAGE Left 10/24/2018   Procedure: INCISION AND DRAINAGE LEFT ELBOW WITH MASS EXCISION;  Surgeon: Charlotte Crumb, MD;  Location: [place];  Service: Orthopedics;  Laterality: Left;  MULTIPLE TOOTH EXTRACTIONS     None     Family History:  Family History  Problem Relation Age of Onset   Thyroid disease Sister    Colon cancer Neg Hx    Colon polyps Neg Hx    Breast cancer Neg Hx    Prostate cancer Neg Hx    Pancreatic cancer Neg Hx    Family Psychiatric  History: See admission H&P Social History:  Social History   Substance and Sexual Activity  Alcohol Use Not Currently   Comment: quit 2019     Social History   Substance and Sexual Activity  Drug Use Not Currently   Types: Marijuana, Cocaine   Comment: cocaine last use 10/26/2018, Marijuana sping 2019     Social History   Socioeconomic History   Marital status: Divorced    Spouse name: Not on file   Number of children: Not on file   Years of education: Not on file   Highest education level: Not on file  Occupational History   Occupation: disabled  Tobacco Use   Smoking status: Every Day    Packs/day: 0.50    Years: 50.00    Pack years: 25.00    Types: Cigarettes   Smokeless tobacco: Never  Vaping Use   Vaping Use: Never used  Substance and Sexual Activity   Alcohol use: Not Currently    Comment: quit 2019   Drug use: Not Currently    Types: Marijuana, Cocaine    Comment: cocaine last use 10/26/2018, Marijuana sping 2019   Sexual activity: Not Currently  Other Topics Concern   Not on file  Social History Narrative   Not on file   Social Determinants of Health   Financial Resource Strain: Not on file  Food Insecurity: Not on file  Transportation Needs: Not on file  Physical Activity: Not on file  Stress: Not on file  Social Connections: Not on file   Additional Social History:                         Sleep: Fair  Appetite:  Good  Current Medications: Current Facility-Administered Medications  Medication Dose Route Frequency Provider Last Rate Last Admin   acetaminophen (TYLENOL) tablet 650 mg  650 mg Oral Q6H PRN Chalmers Guest, NP   650 mg at 02/11/21 1817   alum & mag hydroxide-simeth (MAALOX/MYLANTA) 200-200-20 MG/5ML suspension 30 mL  30 mL Oral Q4H PRN Chalmers Guest, NP       amLODipine (NORVASC) tablet 5 mg  5 mg Oral Daily Sharma Covert, MD   5 mg at 02/13/21 0816   amoxicillin-clavulanate (AUGMENTIN) 875-125 MG per tablet 1 tablet  1 tablet Oral Q12H Sharma Covert, MD   1 tablet at 02/13/21 0816   cloBAZam (ONFI) tablet 10 mg  10 mg Oral QHS Margorie John W, PA-C   10 mg at 02/12/21 2134   feeding supplement (GLUCERNA SHAKE) (GLUCERNA SHAKE) liquid 237 mL  237 mL Oral BID BM Sharma Covert, MD   237 mL at 02/12/21 1424    hydrOXYzine (ATARAX/VISTARIL) tablet 25 mg  25 mg Oral TID PRN Chalmers Guest, NP       insulin aspart (novoLOG) injection 0-15 Units  0-15 Units Subcutaneous TID WC Chalmers Guest, NP   2 Units at 02/13/21 1215   insulin aspart (novoLOG) injection 0-5 Units  0-5 Units Subcutaneous QHS Chalmers Guest, NP  LORazepam (ATIVAN) tablet 1 mg  1 mg Oral BID PRN Sharma Covert, MD       magnesium hydroxide (MILK OF MAGNESIA) suspension 30 mL  30 mL Oral Daily PRN Chalmers Guest, NP       nicotine (NICODERM CQ - dosed in mg/24 hours) patch 21 mg  21 mg Transdermal Daily Sharma Covert, MD       pantoprazole (PROTONIX) EC tablet 40 mg  40 mg Oral Daily Chalmers Guest, NP   40 mg at 02/13/21 0816   QUEtiapine (SEROQUEL) tablet 300 mg  300 mg Oral QHS Sharma Covert, MD   300 mg at 02/12/21 2134    Lab Results:  Results for orders placed or performed during the hospital encounter of 02/09/21 (from the past 48 hour(s))  Glucose, capillary     Status: Abnormal   Collection Time: 02/11/21  4:52 PM  Result Value Ref Range   Glucose-Capillary 137 (H) 70 - 99 mg/dL    Comment: Glucose reference range applies only to samples taken after fasting for at least 8 hours.  Glucose, capillary     Status: Abnormal   Collection Time: 02/11/21  9:18 PM  Result Value Ref Range   Glucose-Capillary 154 (H) 70 - 99 mg/dL    Comment: Glucose reference range applies only to samples taken after fasting for at least 8 hours.   Comment 1 Notify RN   Glucose, capillary     Status: Abnormal   Collection Time: 02/12/21  6:07 AM  Result Value Ref Range   Glucose-Capillary 156 (H) 70 - 99 mg/dL    Comment: Glucose reference range applies only to samples taken after fasting for at least 8 hours.   Comment 1 Notify RN   Glucose, capillary     Status: Abnormal   Collection Time: 02/12/21 12:08 PM  Result Value Ref Range   Glucose-Capillary 156 (H) 70 - 99 mg/dL    Comment: Glucose reference range applies only to  samples taken after fasting for at least 8 hours.  Glucose, capillary     Status: Abnormal   Collection Time: 02/12/21  4:50 PM  Result Value Ref Range   Glucose-Capillary 155 (H) 70 - 99 mg/dL    Comment: Glucose reference range applies only to samples taken after fasting for at least 8 hours.  Glucose, capillary     Status: Abnormal   Collection Time: 02/12/21  8:30 PM  Result Value Ref Range   Glucose-Capillary 153 (H) 70 - 99 mg/dL    Comment: Glucose reference range applies only to samples taken after fasting for at least 8 hours.  Glucose, capillary     Status: Abnormal   Collection Time: 02/13/21  5:55 AM  Result Value Ref Range   Glucose-Capillary 140 (H) 70 - 99 mg/dL    Comment: Glucose reference range applies only to samples taken after fasting for at least 8 hours.  Glucose, capillary     Status: Abnormal   Collection Time: 02/13/21 12:03 PM  Result Value Ref Range   Glucose-Capillary 131 (H) 70 - 99 mg/dL    Comment: Glucose reference range applies only to samples taken after fasting for at least 8 hours.    Blood Alcohol level:  Lab Results  Component Value Date   [facility] <10 01/29/2021   ETH <10 123XX123    Metabolic Disorder Labs: Lab Results  Component Value Date   HGBA1C 5.8 (H) 01/30/2021   MPG 119.76 01/30/2021  MPG 283.35 09/02/2018   No results found for: PROLACTIN Lab Results  Component Value Date   CHOL 190 02/10/2021   TRIG 160 (H) 02/10/2021   HDL 26 (L) 02/10/2021   CHOLHDL 7.3 02/10/2021   VLDL 32 02/10/2021   LDLCALC 132 (H) 02/10/2021   LDLCALC 69 11/10/2015    Physical Findings: AIMS: Facial and Oral Movements Muscles of Facial Expression: None, normal Lips and Perioral Area: None, normal Jaw: None, normal Tongue: None, normal,Extremity Movements Upper (arms, wrists, hands, fingers): None, normal Lower (legs, knees, ankles, toes): None, normal (Fall precaution, unsteady gait. Walks with care.), Trunk Movements Neck, shoulders,  hips: None, normal, Overall Severity Severity of abnormal movements (highest score from questions above): None, normal Incapacitation due to abnormal movements: None, normal Patient's awareness of abnormal movements (rate only patient's report): No Awareness, Dental Status Current problems with teeth and/or dentures?: No Does patient usually wear dentures?: No  CIWA:  CIWA-Ar Total: 0 COWS:     Musculoskeletal: Strength & Muscle Tone: decreased Gait & Station: shuffle Patient leans: N/A  Psychiatric Specialty Exam:  Presentation  General Appearance: Fairly Groomed  Eye Contact:Good  Speech:Clear and Coherent; Normal Rate  Speech Volume:Normal  Handedness:Right   Mood and Affect  Mood:Euthymic  Affect:Appropriate   Thought Process  Thought Processes:Coherent  Descriptions of Associations:Intact  Orientation:Full (Time, Place and Person)  Thought Content:Logical  History of Schizophrenia/Schizoaffective disorder:No  Duration of Psychotic Symptoms:No data recorded Hallucinations:Hallucinations: None Ideas of Reference:None  Suicidal Thoughts:Suicidal Thoughts: No Homicidal Thoughts:Homicidal Thoughts: No  Sensorium  Memory:Immediate Fair; Recent Fair; Remote Fair  Judgment:Fair  Insight:Fair   Executive Functions  Concentration:Fair  Attention Span:Fair  [place]   Psychomotor Activity  Psychomotor Activity: Psychomotor Activity: Normal  Assets  Assets:Desire for Improvement; Resilience; Housing; Social Support   Sleep  Sleep: Sleep: Fair Number of Hours of Sleep: 5   Physical Exam: Physical Exam Vitals and nursing note reviewed.  HENT:     Head: Normocephalic.  Pulmonary:     Effort: Pulmonary effort is normal.  Neurological:     General: No focal deficit present.     Mental Status: He is alert and oriented to person, place, and time.   Review of Systems  All other systems  reviewed and are negative. Blood pressure (!) 145/79, pulse 73, temperature (!) 97.4 F (36.3 C), temperature source Oral, resp. rate 16, height 5' 6.75" (1.695 m), weight 83.5 kg, SpO2 97 %. Body mass index is 29.03 kg/m.   Treatment Plan Summary: Daily contact with patient to assess and evaluate symptoms and progress in treatment, Medication management, and Plan patient is seen and examined.  Patient is a 65 year old male with the above-stated past psychiatric history who is seen in follow-up.  Diagnosis: 1.  Substance-induced mood disorder 2.  History of bipolar disorder 3.  Hepatitis C 4.  Cirrhosis of the liver 5.  Diabetes mellitus type 2 6.  Recent delirious episode basically resolved 7.  Metastatic prostate cancer 8.  Hypertension 9.  Mild cognitive dysfunction versus dementia 10.  History of traumatic brain injury 11.  Polysubstance use disorders most recently cocaine 12.  Mild aortic valve stenosis 13.  Probable aspiration pneumonitis 14.  Hyperlipidemia  15.  seizure disorder 16.  COPD  Pertinent findings on examination today: 1.  Cognitive status continues to slowly improve. 2.  Diabetes is stable. 3.  Motor deficits are still present but his motor strength seems to be improving. 4.  Blood  pressure is improved. 5.  No suicidal or homicidal ideation. 6.  No evidence of any psychosis including auditory or visual hallucinations. 7.  Mood is quite good.  Plan: 1.  Continue Norvasc 5 mg p.o. daily for hypertension. 2.  Continue Augmentin 875/125 mg p.o. every 12 hours for 7 days for possible aspiration pneumonitis.  First dose was on 02/10/2021. 3.  Continue Onfi 10 mg p.o. nightly for seizure disorder. 4.  Continue hydroxyzine 25 mg p.o. 3 times daily as needed anxiety. 5.  Stop sliding scale insulin. 6.  Change lorazepam to 1 mg p.o. daily as needed agitation. 7.  Continue Protonix 40 mg p.o. daily for gastric protection. 8.  Increase Seroquel to 400 mg p.o.  nightly for mood stability and sleep. 9.  Change blood sugar checks to daily only 10.  Disposition planning-in progress.   Sharma Covert, MD 02/13/2021, 3:36 PM

## 2021-02-13 NOTE — Progress Notes (Signed)
Pt has been on a 1:1 with staff for safety. Pt denies SI/HI/AVH, has been calm, cooperative, and confused. Pt has a good appetite, ate dinner, no distress noted, pt denies pain, pt spends time resting in his room. Will continue to monitor pt per Q15 minute face checks and on a 1:1 for safety.

## 2021-02-13 NOTE — Progress Notes (Signed)
Nursing Note 1:1  D. Pt has rested in bed, woke up confused.  Pt wearing gaitbelt.  Pt becomes very confused at night.  A.  1:1 continued as ordered for Pt safety  R.  Pt remains safe on the unit

## 2021-02-13 NOTE — ED Triage Notes (Signed)
Pt arrive by [facility] from [facility] hospital for c/o of CP and SOB that started today at 1830. 324 mg ASA given by EMS pta. Pt IVC for SI attempting to OD on medication. BP 144/77, RN 90, R 20, SPO2 96, cbg 150

## 2021-02-13 NOTE — Progress Notes (Signed)
   02/13/21 0211  Psych Admission Type (Psych Patients Only)  Admission Status Involuntary  Psychosocial Assessment  Patient Complaints Anxiety;Restlessness  Eye Contact Fair  Facial Expression Animated  Affect Appropriate to circumstance  Speech Logical/coherent;Other (Comment) (Confused as th night goes on)  Interaction Forwards little  Motor Activity Restless;Unsteady (Pt is on 1:1 for safety)  Appearance/Hygiene Disheveled  Behavior Characteristics Appropriate to situation  Mood Anxious;Pleasant  Thought Process  Coherency Disorganized  Content Ambivalence  Delusions None reported or observed  Perception Derealization  Hallucination Auditory;Visual (At night)  Judgment Impaired  Confusion Moderate  Danger to Self  Current suicidal ideation? Denies  Danger to Others  Danger to Others None reported or observed

## 2021-02-14 DIAGNOSIS — F319 Bipolar disorder, unspecified: Secondary | ICD-10-CM | POA: Diagnosis present

## 2021-02-14 DIAGNOSIS — Z87820 Personal history of traumatic brain injury: Secondary | ICD-10-CM

## 2021-02-14 LAB — TSH: TSH: 3.501 u[IU]/mL (ref 0.350–4.500)

## 2021-02-14 LAB — GLUCOSE, CAPILLARY: Glucose-Capillary: 165 mg/dL — ABNORMAL HIGH (ref 70–99)

## 2021-02-14 LAB — TROPONIN I (HIGH SENSITIVITY): Troponin I (High Sensitivity): <2 ng/L (ref <18)

## 2021-02-14 MED ORDER — ESCITALOPRAM OXALATE 5 MG PO TABS
5.0000 mg | ORAL_TABLET | Freq: Every day | ORAL | Status: DC
  Administered 2021-02-14 – 2021-02-16 (×3): 5 mg via ORAL
  Filled 2021-02-14 (×6): qty 1

## 2021-02-14 NOTE — Progress Notes (Signed)
Sitter called from [facility] to say that police picked up pt and not the sitter when pt reported to be ready to be returned to ED. Pt is a fall risk and needs sitter with them. Sitter trying to get ride from [employer] manager to return to facility.

## 2021-02-14 NOTE — Progress Notes (Signed)
1000: Pt is Aox2, to person and place, is on a 1:1 for fall prevention and safety, is calm, cooperative, denies SI/HI/AVH, is medication complaint, spends time resting in his room often, talking to the 1:1, reports his mood is good. Will continue to monitor pt on a 1:1 and Q15 minute for safety and progress. Pt denies pain or discomfort, none noted.

## 2021-02-14 NOTE — [facility] Group Notes (Signed)
Pt didn't attend group. 

## 2021-02-14 NOTE — ED Notes (Signed)
[facility] called to transport patient to [facility]

## 2021-02-14 NOTE — Progress Notes (Addendum)
[facility] MD Progress Note  02/14/2021 4:05 PM Juan Juarez  MRN:  [id number] Subjective:   Juan Juarez is a 65 yr old male who presents after a suicide attempt via OD. 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 Overnight patient was sent to the ED for CP and had CXR that showed no acute cardiopulmonary disease and negative troponins X3. Based on EKG and labs, ACS was not suspected and he was sent back to [facility].   He reports that he is doing okay today.  He reports that his sleep is okay.  He reports that his appetite is doing fine.  He reports no SI, HI, or AVH.  Discussed with him what his plans for discharge were.  He reports that he still wants to go back to his motel room.  Had a conversation about the danger inherent in this given his instability and difficulty walking.  He repeatedly stated that he did not want to live with anyone and refused any sort of assisted living arrangement.  Discussed with him the potential need to place an APS report given his current status and abilities to take care of himself.  Discussed with him starting Lexapro today for his depression.  Discussed risks and benefits with him, all questions were answered.  He reported no issues with any of his medications.  He reported no other concerns at present.   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 and substance abuse, Seizure Disorder, 1 previous suicide attempt (OD)  Past  Medical History:  Past Medical History:  Diagnosis Date   Anxiety    Chronic elbow pain, left    Depression    Diabetes mellitus, type II ([place])    type II   Gout    Head injury 12/2017   Hepatitis C without hepatic coma    Hypertension    denies   Inflammatory arthropathy    left elbow   Insomnia    Non compliance w medication regimen    Osteoarthritis    Polysubstance abuse ([place])    Prostate cancer ([place])    Protein-calorie malnutrition, severe ([place]) 09/13/2016   Seizure ([place]) 12/2017   after head Injury   Thrombocytopenia [facility])     Past Surgical History:  Procedure Laterality Date   BURR HOLE Right 01/22/2018   Procedure: RIGHT BURR HOLES;  Surgeon: Newman Pies, MD;  Location: [place];  Service: Neurosurgery;  Laterality: Right;   I & D EXTREMITY Left 11/06/2018   Procedure: IRRIGATION AND DEBRIDEMENT LEFT ELBOW JOINT;  Surgeon: Charlotte Crumb, MD;  Location: [place];  Service: Orthopedics;  Laterality: Left;   INCISION AND DRAINAGE Left 10/24/2018   Procedure: INCISION AND DRAINAGE LEFT ELBOW WITH MASS EXCISION;  Surgeon: Charlotte Crumb, MD;  Location: [place];  Service: Orthopedics;  Laterality: Left;   MULTIPLE TOOTH EXTRACTIONS     None     Family History:  Family History  Problem Relation Age of Onset   Thyroid disease Sister    Colon cancer Neg Hx    Colon polyps Neg Hx  Breast cancer Neg Hx    Prostate cancer Neg Hx    Pancreatic cancer Neg Hx    Family Psychiatric  History: None Reported Social History:  Social History   Substance and Sexual Activity  Alcohol Use Not Currently   Comment: quit 2019     Social History   Substance and Sexual Activity  Drug Use Not Currently   Types: Marijuana, Cocaine   Comment: cocaine last use 10/26/2018, Marijuana sping 2019    Social History   Socioeconomic History   Marital status: Divorced    Spouse name: Not on file   Number of children: Not on file   Years of education: Not on file   Highest  education level: Not on file  Occupational History   Occupation: disabled  Tobacco Use   Smoking status: Every Day    Packs/day: 0.50    Years: 50.00    Pack years: 25.00    Types: Cigarettes   Smokeless tobacco: Never  Vaping Use   Vaping Use: Never used  Substance and Sexual Activity   Alcohol use: Not Currently    Comment: quit 2019   Drug use: Not Currently    Types: Marijuana, Cocaine    Comment: cocaine last use 10/26/2018, Marijuana sping 2019   Sexual activity: Not Currently  Other Topics Concern   Not on file  Social History Narrative   Not on file   Social Determinants of Health   Financial Resource Strain: Not on file  Food Insecurity: Not on file  Transportation Needs: Not on file  Physical Activity: Not on file  Stress: Not on file  Social Connections: Not on file    Sleep: Fair  Appetite:  Fair  Current Medications: Current Facility-Administered Medications  Medication Dose Route Frequency Provider Last Rate Last Admin   acetaminophen (TYLENOL) tablet 650 mg  650 mg Oral Q6H PRN Chalmers Guest, NP   650 mg at 02/11/21 1817   alum & mag hydroxide-simeth (MAALOX/MYLANTA) 200-200-20 MG/5ML suspension 30 mL  30 mL Oral Q4H PRN Chalmers Guest, NP       amLODipine (NORVASC) tablet 5 mg  5 mg Oral Daily Sharma Covert, MD   5 mg at 02/14/21 0754   amoxicillin-clavulanate (AUGMENTIN) 875-125 MG per tablet 1 tablet  1 tablet Oral Q12H Sharma Covert, MD   1 tablet at 02/14/21 0754   cloBAZam (ONFI) tablet 10 mg  10 mg Oral QHS Margorie John W, PA-C   10 mg at 02/14/21 0132   escitalopram (LEXAPRO) tablet 5 mg  5 mg Oral Daily Briant Cedar, MD   5 mg at 02/14/21 1210   feeding supplement (GLUCERNA SHAKE) (GLUCERNA SHAKE) liquid 237 mL  237 mL Oral BID BM Sharma Covert, MD   237 mL at 02/14/21 1000   hydrOXYzine (ATARAX/VISTARIL) tablet 25 mg  25 mg Oral TID PRN Chalmers Guest, NP       magnesium hydroxide (MILK OF MAGNESIA) suspension 30 mL  30  mL Oral Daily PRN Chalmers Guest, NP       nicotine (NICODERM CQ - dosed in mg/24 hours) patch 21 mg  21 mg Transdermal Daily Sharma Covert, MD       pantoprazole (PROTONIX) EC tablet 40 mg  40 mg Oral Daily Chalmers Guest, NP   40 mg at 02/14/21 0754   QUEtiapine (SEROQUEL) tablet 300 mg  300 mg Oral QHS Sharma Covert, MD   300 mg at  02/14/21 0132    Lab Results:  Results for orders placed or performed during the hospital encounter of 02/09/21 (from the past 48 hour(s))  Glucose, capillary     Status: Abnormal   Collection Time: 02/12/21  4:50 PM  Result Value Ref Range   Glucose-Capillary 155 (H) 70 - 99 mg/dL    Comment: Glucose reference range applies only to samples taken after fasting for at least 8 hours.  Glucose, capillary     Status: Abnormal   Collection Time: 02/12/21  8:30 PM  Result Value Ref Range   Glucose-Capillary 153 (H) 70 - 99 mg/dL    Comment: Glucose reference range applies only to samples taken after fasting for at least 8 hours.  Glucose, capillary     Status: Abnormal   Collection Time: 02/13/21  5:55 AM  Result Value Ref Range   Glucose-Capillary 140 (H) 70 - 99 mg/dL    Comment: Glucose reference range applies only to samples taken after fasting for at least 8 hours.  Glucose, capillary     Status: Abnormal   Collection Time: 02/13/21 12:03 PM  Result Value Ref Range   Glucose-Capillary 131 (H) 70 - 99 mg/dL    Comment: Glucose reference range applies only to samples taken after fasting for at least 8 hours.  Glucose, capillary     Status: Abnormal   Collection Time: 02/13/21  5:00 PM  Result Value Ref Range   Glucose-Capillary 126 (H) 70 - 99 mg/dL    Comment: Glucose reference range applies only to samples taken after fasting for at least 8 hours.  Glucose, capillary     Status: Abnormal   Collection Time: 02/13/21  6:42 PM  Result Value Ref Range   Glucose-Capillary 163 (H) 70 - 99 mg/dL    Comment: Glucose reference range applies only to  samples taken after fasting for at least 8 hours.  Troponin I (High Sensitivity)     Status: None   Collection Time: 02/13/21 10:00 PM  Result Value Ref Range   Troponin I (High Sensitivity) 3 <18 ng/L    Comment: (NOTE) Elevated high sensitivity troponin I (hsTnI) values and significant  changes across serial measurements may suggest ACS but many other  chronic and acute conditions are known to elevate hsTnI results.  Refer to the "Links" section for chest pain algorithms and additional  guidance. Performed at [facility], [place] [address], [place], [place] [address]   Glucose, capillary     Status: Abnormal   Collection Time: 02/14/21  6:12 AM  Result Value Ref Range   Glucose-Capillary 165 (H) 70 - 99 mg/dL    Comment: Glucose reference range applies only to samples taken after fasting for at least 8 hours.  Troponin I (High Sensitivity)     Status: None   Collection Time: 02/14/21  6:38 AM  Result Value Ref Range   Troponin I (High Sensitivity) <2 <18 ng/L    Comment: (NOTE) Elevated high sensitivity troponin I (hsTnI) values and significant  changes across serial measurements may suggest ACS but many other  chronic and acute conditions are known to elevate hsTnI results.  Refer to the "Links" section for chest pain algorithms and additional  guidance. Performed at [facility], [place] [address], [place], [place] [address]     Blood Alcohol level:  Lab Results  Component Value Date   Prg [facility] <10 01/29/2021   ETH <10 123XX123    Metabolic Disorder Labs: Lab Results  Component Value Date   HGBA1C 5.8 (H) 01/30/2021   MPG 119.76 01/30/2021   MPG 283.35 09/02/2018   No results found for: PROLACTIN Lab Results  Component Value Date   CHOL 190 02/10/2021   TRIG 160 (H) 02/10/2021   HDL 26 (L) 02/10/2021   CHOLHDL 7.3 02/10/2021   VLDL 32 02/10/2021   LDLCALC 132 (H) 02/10/2021   LDLCALC 69 11/10/2015    Physical Findings: AIMS: Facial and  Oral Movements Muscles of Facial Expression: None, normal Lips and Perioral Area: None, normal Jaw: None, normal Tongue: None, normal,Extremity Movements Upper (arms, wrists, hands, fingers): None, normal Lower (legs, knees, ankles, toes): None, normal (Fall precaution, unsteady gait. Walks with care.), Trunk Movements Neck, shoulders, hips: None, normal, Overall Severity Severity of abnormal movements (highest score from questions above): None, normal Incapacitation due to abnormal movements: None, normal Patient's awareness of abnormal movements (rate only patient's report): No Awareness, Dental Status Current problems with teeth and/or dentures?: No Does patient usually wear dentures?: No  CIWA:  CIWA-Ar Total: 0    Musculoskeletal: Strength & Muscle Tone: decreased Gait & Station: shuffle Patient leans: N/A  Psychiatric Specialty Exam:  Presentation  General Appearance: Fairly Groomed  Eye Contact:Good  Speech:-- (initially garbled because he had woken up but improved as interview continued)  Speech Volume:Normal  Handedness:Right   Mood and Affect  Mood: dysphoric  Affect:constricted   Thought Process  Thought Processes:Goal Directed but ruminative about discharge options  Descriptions of Associations:Intact  Orientation:Full (Time, Place and Person)  Thought Content:Logical - denies AVH, paranoia, ideas of reference, or first rank symptoms  History of Schizophrenia/Schizoaffective disorder:No  Duration of Psychotic Symptoms:No data recorded Hallucinations:Hallucinations: None  Ideas of Reference:None  Suicidal Thoughts:Suicidal Thoughts: No  Homicidal Thoughts:Homicidal Thoughts: No   Sensorium  Memory:Immediate Fair; Recent Fair  Judgment:Poor  Insight:Poor   Executive Functions  Concentration:Fair  Attention Span:Fair  [place]   Psychomotor Activity  Psychomotor  Activity:Decreased   Assets  Assets:Desire for improvement, resilience    Sleep  Sleep:Sleep: Fair Number of Hours of Sleep: 4.3    Physical Exam: Physical Exam Vitals reviewed.  Constitutional:      General: He is not in acute distress.    Appearance: Normal appearance. He is normal weight. He is not ill-appearing or toxic-appearing.  HENT:     Head: Normocephalic and atraumatic.  Pulmonary:     Effort: Pulmonary effort is normal.  Neurological:     General: No focal deficit present.     Mental Status: He is alert.   Review of Systems  Respiratory:  Negative for cough and shortness of breath.   Cardiovascular:  Negative for chest pain.  Gastrointestinal:  Negative for abdominal pain, constipation, diarrhea, nausea and vomiting.  Neurological:  Negative for headaches.  Blood pressure 119/66, pulse 80, temperature 97.8 F (36.6 C), temperature source Oral, resp. rate 14, height 5' 6.75" (1.695 m), weight 83.5 kg, SpO2 99 %. Body mass index is 29.03 kg/m.   Treatment Plan Summary: Daily contact with patient to assess and evaluate symptoms and progress in treatment   Juan Juarez is a 65 yr old male who presents after a suicide attempt via OD. 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   Patient continues to decline any form of assisted living.  We will have a MOCA done  to assess his current cognitive status and will get OT to do assessment of independent living.  We will start Lexapro today.   We will have Social Work place an APS consult given the potential danger if he were to discharge back to a hotel.  We will continue to monitor.    Unspecified bipolar and related d/o by hx (r/o mood d/o secondary to TBI, r/o substance induced mood d/o) -Continue Seroquel 300 mg QHS -Start Lexapro 5 mg daily  and monitor for mood escalation   Seizure Disorder: -Continue Onfi 10 mg QHS   HTN: -Continue Amlodipine 5 mg daily   Aspiration Pneumonitis:  -Continue Augmentin 875/125 mg BID (Day 4 of 7) - Sats 99% RA  Nicotine Dependence: -Continue Nicotine patch 21 mg daily  Elevated Glucose - HbgA1c 5.8 prior to admission and continue daily FSBS   CP - resolved - Patient went to ED yesterday for CP workup and worked up with negative serial troponins and Negative CXR  - EKG in the ED showed NSR with QTC 435m, minimal voltage criteria for LVH and cannot r/o anterior infarct age undetermined  Elevated TSH (7.090) - FT4 0.73 prior to admission - will need repeat TSH, FT4 and T3U  -Continue Protonix 40 mg daily -Continue PRN's: Tylenol, Maalox, Atarax, Milk of Magnesia, Trazodone  AFatima SangerPGY-2 02/14/2021, 4:05 PM

## 2021-02-14 NOTE — Progress Notes (Signed)
Nursing 1:1 note D:Pt observed laying in bed with eyes closed. RR even and unlabored. No distress noted. Sitter at bedside A: 1:1 observation continues for safety  R: Pt remains safe

## 2021-02-14 NOTE — Progress Notes (Deleted)
Pt was sent back from the ED with a new set of IVC paperwork due to the IVC paperwork sent with the pt from [facility] was lost.

## 2021-02-14 NOTE — Progress Notes (Signed)
1400: Pt is Aox2, to person and place, is on a 1:1 for fall prevention and safety, is calm, cooperative, denies SI/HI/AVH, appetite is good, ate 100% of lunch, wearing a gait belt when pt walks with staff assist, spends time resting in his room often, talking to the 1:1, continues to report his mood is good. Will continue to monitor pt on a 1:1 and Q15 minute for safety and progress. Pt denies pain or discomfort, none noted.

## 2021-02-14 NOTE — Progress Notes (Signed)
   02/14/21 0130  Psych Admission Type (Psych Patients Only)  Admission Status Involuntary  Psychosocial Assessment  Patient Complaints None  Eye Contact Fair  Facial Expression Other (Comment) (appropriate)  Affect Appropriate to circumstance  Speech Logical/coherent  Interaction Assertive  Motor Activity Unsteady;Other (Comment) (needs to be educated on need to wait for assistance before getting up)  Appearance/Hygiene In scrubs;Disheveled  Behavior Characteristics Cooperative;Calm  Mood Pleasant  Thought Process  Coherency Circumstantial  Content WDL  Delusions None reported or observed  Perception WDL  Hallucination None reported or observed  Judgment Impaired  Confusion None  Danger to Self  Current suicidal ideation? Denies  Danger to Others  Danger to Others None reported or observed   Pt denies SI, HI, AVH and pain. Says he had the best day so far until c/o chest pain. Looking forward to discharge and seeing his grandchildren. Says his medication for seizures has stopped them and made him feel better. Says previous meds caused some AMS. Denies anxiety and depression. Needs to be educated on need to wait for sitter or another staff member before getting up even if he feels more steady. Pt pleasant and cooperative.

## 2021-02-14 NOTE — Progress Notes (Signed)
Recreation Therapy Notes  Animal-Assisted Activity (AAA) Program Checklist/Progress Notes Patient Eligibility Criteria Checklist & Daily Group note for Rec Tx Intervention  Date: 8.16.22 Time: 46 Location: [place]  AAA/T Program Assumption of Risk Form signed by Patient/ or Parent Legal Guardian YES   Patient is free of allergies or severe asthma  YES   Patient reports no fear of animals  YES   Patient reports no history of cruelty to animals YES   Patient understands his/her participation is voluntary  YES   Patient washes hands before animal contact  YES  Patient washes hands after animal contact  YES  Education: Contractor, Appropriate Animal Interaction   Education Outcome: Acknowledges understanding/In group clarification offered/Needs additional education.   Clinical Observations/Feedback: Pt did not attend group session.    Victorino Sparrow, LRT/CTRS        Victorino Sparrow A 02/14/2021 3:58 PM

## 2021-02-14 NOTE — Progress Notes (Signed)
   02/14/21 2000  Psych Admission Type (Psych Patients Only)  Admission Status Involuntary  Psychosocial Assessment  Patient Complaints None  Eye Contact Fair  Facial Expression Other (Comment) (appropriate)  Affect Appropriate to circumstance  Speech Logical/coherent  Interaction Assertive  Motor Activity Unsteady  Appearance/Hygiene In scrubs;Disheveled  Behavior Characteristics Cooperative;Calm  Mood Pleasant  Thought Process  Coherency Circumstantial  Content WDL  Delusions None reported or observed  Perception WDL  Hallucination None reported or observed  Judgment Impaired  Confusion None  Danger to Self  Current suicidal ideation? Denies  Danger to Others  Danger to Others None reported or observed   Good spirits. Denied SI, HI, AVH. Mentioned discomfort in R toe but refused any medication. "I can't really give you a pain number." Pt spontaneous and jovial. Denies anxiety and depression. Seen wearing gait belt in company of sitter.

## 2021-02-14 NOTE — ED Notes (Signed)
Report given to [facility]. Sitter and patient both updated. Safe transport requested back to [facility]

## 2021-02-14 NOTE — Progress Notes (Signed)
Patient did attend the evening speaker AA meeting.  

## 2021-02-14 NOTE — Progress Notes (Signed)
1800: Pt is Aox2, to person and place, is on a 1:1 for fall prevention and safety, is calm, cooperative,  pleasant, denies SI/HI/AVH, spends time resting in his room often, or playing a solo card game, talking to the 1:1, continues to report his mood is good. Will continue to monitor pt on a 1:1 and Q15 minute for safety and progress. Pt denies pain or discomfort, none noted.  Pt requires support to prevent falls when pt transfers in and out of bed and when pt falls. Pt is impulsive at times and will suddenly want to wipe something on the floor and requires redirection from doing this task as it causes pt to begin to fall, but pt does not respond well to redirections and staff must use the gait belt to support pt and prevent falls. Pt states, "It doesn't matter I fall like 10 times a day at home. Just let me wipe up that food I just dropped." Pt was firmly redirected from doing that and staff wiped the food.

## 2021-02-14 NOTE — Progress Notes (Signed)
Pt returned to [facility] by [employer]. Pt VSS during assessment. Pt denies pain. "I felt better once I burped. I guess it was trapped gas?" Pt diagnostic tests at ED were negative for cardiac event. Pt given nighttime meds and something to eat.

## 2021-02-14 NOTE — Progress Notes (Signed)
Nursing 1:1 note D:Pt observed sitting on bed eating a meal. Pt returned from ED. Pt educated on need for assistance with gait belt when ambulating even though he says he feels more steady now.   A: 1:1 observation continues for safety  R: Pt remains safe

## 2021-02-15 LAB — T4, FREE: Free T4: 0.66 ng/dL (ref 0.61–1.12)

## 2021-02-15 LAB — GLUCOSE, CAPILLARY: Glucose-Capillary: 162 mg/dL — ABNORMAL HIGH (ref 70–99)

## 2021-02-15 MED ORDER — LORAZEPAM 1 MG PO TABS: 1.0000 mg | ORAL_TABLET | ORAL | Status: DC | PRN

## 2021-02-15 MED ORDER — ZIPRASIDONE MESYLATE 20 MG IM SOLR
20.0000 mg | INTRAMUSCULAR | Status: AC | PRN
  Administered 2021-02-15: 20 mg via INTRAMUSCULAR

## 2021-02-15 MED ORDER — ZIPRASIDONE MESYLATE 20 MG IM SOLR
INTRAMUSCULAR | Status: AC
  Filled 2021-02-15: qty 20

## 2021-02-15 MED ORDER — OLANZAPINE 5 MG PO TBDP: 5.0000 mg | ORAL_TABLET | Freq: Three times a day (TID) | ORAL | Status: DC | PRN

## 2021-02-15 MED ORDER — OLANZAPINE 5 MG PO TBDP
ORAL_TABLET | ORAL | Status: AC
  Filled 2021-02-15: qty 1

## 2021-02-15 MED ORDER — ZIPRASIDONE MESYLATE 20 MG IM SOLR: 20.0000 mg | INTRAMUSCULAR | Status: AC
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
## 2021-02-15 NOTE — Progress Notes (Signed)
1:1  Note D:  Pt resting quietly in his bed.  Breathing even and unlabored. He appears to be asleep. A:  1:1 continues for safety. R:  Pt remains safe on the unit.

## 2021-02-15 NOTE — Progress Notes (Signed)
Recreation Therapy Notes  Date:  8.17.22 Time: 0930 Location: 300 Hall Dayroom  Group Topic: Stress Management  Goal Area(s) Addresses:  Patient will identify positive stress management techniques. Patient will identify benefits of using stress management post d/c.  Intervention: Stress Management  Activity:  Meditation.  LRT played a meditation that focused on calming anxiety.  Meditation spoke of acknowledging your feelings but not letting them take over your thoughts and emotions.  Education:  Stress Management, Discharge Planning.   Education Outcome: Acknowledges Education  Clinical Observations/Feedback: Pt did not attend group session.    Juan Juarez Lydia Guiles A 02/15/2021 12:08 PM

## 2021-02-15 NOTE — Progress Notes (Signed)
Nursing 1:1 note D:Pt observed sleeping in bed with eyes closed. RR even and unlabored. No distress noted. A: 1:1 observation continues for safety  R: Pt remains safe  

## 2021-02-15 NOTE — Progress Notes (Signed)
Rhonda from [facility] [phone]) called this CSW for more information about patient who may need services.  CSW called Suanne Marker back and left a message for a call back.    Amybeth Sieg, LCSW, [place] Social Worker  [facility]
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
## 2021-02-15 NOTE — Progress Notes (Signed)
1:1 Observation  Patient in room sleeping respirations noted. No S/S of distress. 1: Observation continued Patient remains safe. Will continue to monitor.

## 2021-02-15 NOTE — Progress Notes (Signed)
Patient very agitated hitting the wall c/o  wanting to go home to take care of his prostate. PRN vistaril and zyprexa offered Patient declined. IM Geodon (see Mar) given at 1330 with show of support. Patient continue to monitor on 1:1 for safety. Patient remains safe.

## 2021-02-15 NOTE — Progress Notes (Signed)
1:1 Note  Patient in his room interacting with Staff, Patient compliant with his fall protocol. Denies SI/HI/A/VH at present. Patient 1:1 for safety ongoing without any harm. Will continue to monitor.

## 2021-02-15 NOTE — Plan of Care (Cosign Needed)
Completed a Microbiologist ([place]), on which the patient scored the following:   Visuospatial/Executive: 1/5 Naming: 3/3 Memory: No points Attention: 2/2 Language: 2/2 Fluency: 0/1 Abstraction: 2/2 Delayed recall: 0/5 Orientation: 4/6   Total score: 17/30 (one point added for <59yreducation. This result indicates moderate cognitive impairment.    HSpero Curb MS3

## 2021-02-15 NOTE — Progress Notes (Signed)
He is currently resting with his eyes closed and is snoring loudly.  Sitter remains for safety.

## 2021-02-15 NOTE — Progress Notes (Signed)
Nursing 1:1 note D:Pt observed sleeping in bed with eyes closed. RR even and unlabored. No distress noted. A: 1:1 observation continues for safety  R: pt remains safe  

## 2021-02-15 NOTE — [facility] Group Notes (Signed)
Type of Therapy and Topic:  Group Therapy:  Self-Esteem   Participation Level:  Did not attend   Description of Group: This group addressed positive self-esteem. Patients were given a worksheet with a blank shield. Patients were asked what a shield is and when it is used. Patients were asked to list, draw, or write protective factors in the their lives on their shields. Patients discussed the words, ideas and drawings that they put on their shield. Patients were encouraged to have a daily reflection of positive characteristics/ protective factors.  Therapeutic Goals Patient will verbalize two of their positive qualities Patient will demonstrate insight but naming social supports in their lives Patient will verbalize their feelings when voicing positive self affirmations and when voicing positive affirmations of others Patients will discuss the potential positive impact on their wellness/recovery of focusing on positive traits of self and others.  Summary of Patient Progress:    Did not attend

## 2021-02-15 NOTE — Progress Notes (Signed)
Patient walked out of his room without assistance and refused to allow this author to put the gait belt on him.

## 2021-02-15 NOTE — Tx Team (Signed)
Interdisciplinary Treatment and Diagnostic Plan Update  02/15/2021 Time of Session: 11:05 Juan Juarez MRN: [id number]  Principal Diagnosis: Bipolar disorder, unspecified ([place])  Secondary Diagnoses: Principal Problem:   Bipolar disorder, unspecified ([place]) Active Problems:   Cocaine abuse ([place])   Seizure disorder ([place])   History of traumatic brain injury   Current Medications:  Current Facility-Administered Medications  Medication Dose Route Frequency Provider Last Rate Last Admin   acetaminophen (TYLENOL) tablet 650 mg  650 mg Oral Q6H PRN Chalmers Guest, NP   650 mg at 02/11/21 1817   alum & mag hydroxide-simeth (MAALOX/MYLANTA) 200-200-20 MG/5ML suspension 30 mL  30 mL Oral Q4H PRN Chalmers Guest, NP       amLODipine (NORVASC) tablet 5 mg  5 mg Oral Daily Sharma Covert, MD   5 mg at 02/15/21 0801   amoxicillin-clavulanate (AUGMENTIN) 875-125 MG per tablet 1 tablet  1 tablet Oral Q12H Sharma Covert, MD   1 tablet at 02/15/21 0802   cloBAZam (ONFI) tablet 10 mg  10 mg Oral QHS Margorie John W, PA-C   10 mg at 02/14/21 2135   escitalopram (LEXAPRO) tablet 5 mg  5 mg Oral Daily Briant Cedar, MD   5 mg at 02/15/21 0802   feeding supplement (GLUCERNA SHAKE) (GLUCERNA SHAKE) liquid 237 mL  237 mL Oral BID BM Sharma Covert, MD   237 mL at 02/15/21 1000   hydrOXYzine (ATARAX/VISTARIL) tablet 25 mg  25 mg Oral TID PRN Chalmers Guest, NP   25 mg at 02/15/21 0804   OLANZapine zydis (ZYPREXA) disintegrating tablet 5 mg  5 mg Oral Q8H PRN Harlow Asa, MD       And   LORazepam (ATIVAN) tablet 1 mg  1 mg Oral PRN Harlow Asa, MD       magnesium hydroxide (MILK OF MAGNESIA) suspension 30 mL  30 mL Oral Daily PRN Chalmers Guest, NP       nicotine (NICODERM CQ - dosed in mg/24 hours) patch 21 mg  21 mg Transdermal Daily Sharma Covert, MD       OLANZapine zydis (ZYPREXA) 5 MG disintegrating tablet            pantoprazole (PROTONIX) EC tablet 40 mg  40 mg Oral  Daily Chalmers Guest, NP   40 mg at 02/15/21 0801   QUEtiapine (SEROQUEL) tablet 300 mg  300 mg Oral QHS Sharma Covert, MD   300 mg at 02/14/21 2135   ziprasidone (GEODON) 20 MG injection            PTA Medications: Medications Prior to Admission  Medication Sig Dispense Refill Last Dose   ALPRAZolam (XANAX) 0.5 MG tablet Take 0.5 mg by mouth at bedtime as needed.      blood glucose meter kit and supplies Dispense based on patient and insurance preference. Use up to four times daily as directed. (FOR ICD-10 E10.9, E11.9). 1 each 0    cloBAZam (ONFI) 10 MG tablet clobazam 10 mg tablet  TAKE ONE TABLET BY MOUTH EVERY DAY      doxepin (SINEQUAN) 75 MG capsule Take 75 mg by mouth.      EASY COMFORT INSULIN SYRINGE 31G X 5/16" 1 ML MISC USE AS DIRECTED ONCE DAILY FOR LEVEMIR FLEXTOUCH 100 DAY SUPPLY      gabapentin (NEURONTIN) 300 MG capsule Take by mouth.      glipiZIDE (GLUCOTROL XL) 5 MG 24 hr tablet Take 5 mg  by mouth daily.      LEVEMIR 100 UNIT/ML injection Inject 0.4 mLs (40 Units total) into the skin daily. 10 mL 0    metFORMIN (GLUCOPHAGE) 500 MG tablet Take 500 mg by mouth 2 (two) times daily.      QUEtiapine (SEROQUEL) 100 MG tablet Take 1 tablet (100 mg total) by mouth 2 (two) times daily. 60 tablet 0    sertraline (ZOLOFT) 100 MG tablet Take 100 mg by mouth daily.      simvastatin (ZOCOR) 10 MG tablet Take 10 mg by mouth daily.       Patient Stressors: Marital or family conflict Medication change or noncompliance  Patient Strengths: Technical sales engineer for treatment/growth  Treatment Modalities: Medication Management, Group therapy, Case management,  1 to 1 session with clinician, Psychoeducation, Recreational therapy.   Physician Treatment Plan for Primary Diagnosis: Bipolar disorder, unspecified ([place]) Long Term Goal(s): Improvement in symptoms so as ready for discharge   Short Term Goals: Ability to identify and develop effective coping behaviors  will improve Compliance with prescribed medications will improve Ability to identify triggers associated with substance abuse/mental health issues will improve Ability to identify changes in lifestyle to reduce recurrence of condition will improve Ability to verbalize feelings will improve Ability to disclose and discuss suicidal ideas Ability to demonstrate self-control will improve  Medication Management: Evaluate patient's response, side effects, and tolerance of medication regimen.  Therapeutic Interventions: 1 to 1 sessions, Unit Group sessions and Medication administration.  Evaluation of Outcomes: Progressing  Physician Treatment Plan for Secondary Diagnosis: Principal Problem:   Bipolar disorder, unspecified ([place]) Active Problems:   Cocaine abuse ([place])   Seizure disorder ([place])   History of traumatic brain injury  Long Term Goal(s): Improvement in symptoms so as ready for discharge   Short Term Goals: Ability to identify and develop effective coping behaviors will improve Compliance with prescribed medications will improve Ability to identify triggers associated with substance abuse/mental health issues will improve Ability to identify changes in lifestyle to reduce recurrence of condition will improve Ability to verbalize feelings will improve Ability to disclose and discuss suicidal ideas Ability to demonstrate self-control will improve     Medication Management: Evaluate patient's response, side effects, and tolerance of medication regimen.  Therapeutic Interventions: 1 to 1 sessions, Unit Group sessions and Medication administration.  Evaluation of Outcomes: Progressing   RN Treatment Plan for Primary Diagnosis: Bipolar disorder, unspecified ([place]) Long Term Goal(s): Knowledge of disease and therapeutic regimen to maintain health will improve  Short Term Goals: Ability to verbalize feelings will improve, Ability to disclose and discuss suicidal ideas, and Ability to  identify and develop effective coping behaviors will improve  Medication Management: RN will administer medications as ordered by provider, will assess and evaluate patient's response and provide education to patient for prescribed medication. RN will report any adverse and/or side effects to prescribing provider.  Therapeutic Interventions: 1 on 1 counseling sessions, Psychoeducation, Medication administration, Evaluate responses to treatment, Monitor vital signs and CBGs as ordered, Perform/monitor CIWA, COWS, AIMS and Fall Risk screenings as ordered, Perform wound care treatments as ordered.  Evaluation of Outcomes: Progressing   LCSW Treatment Plan for Primary Diagnosis: Bipolar disorder, unspecified ([place]) Long Term Goal(s): Safe transition to appropriate next level of care at discharge, Engage patient in therapeutic group addressing interpersonal concerns.  Short Term Goals: Engage patient in aftercare planning with referrals and resources, Increase ability to appropriately verbalize feelings, and Increase emotional regulation  Therapeutic Interventions:  Assess for all discharge needs, 1 to 1 time with Social worker, Explore available resources and support systems, Assess for adequacy in community support network, Educate family and significant other(s) on suicide prevention, Complete Psychosocial Assessment, Interpersonal group therapy.  Evaluation of Outcomes: Progressing   Progress in Treatment: Attending groups: No. and As evidenced by:  pt was recently admitted Participating in groups: No. and As evidenced by:  pt was recently admitted Taking medication as prescribed: No. Toleration medication: Yes. Family/Significant other contact made: Yes, individual(s) contacted:  Mother and daughter  Patient understands diagnosis: No. Discussing patient identified problems/goals with staff: Yes. Medical problems stabilized or resolved: Yes. Denies suicidal/homicidal ideation:  Yes. Issues/concerns per patient self-inventory: No. Other: None  New problem(s) identified: No, Describe:  None  New Short Term/Long Term Goal(s):medication stabilization, elimination of SI thoughts, development of comprehensive mental wellness plan.   Patient Goals:  "get more exercise."   Discharge Plan or Barriers: Patient recently admitted. CSW will continue to follow and assess for appropriate referrals and possible discharge planning.   Reason for Continuation of Hospitalization: Anxiety Depression Medication stabilization Suicidal ideation  Estimated Length of Stay: 3-5 days  Attendees: Patient: Juan [place]" Juarez 02/15/2021   Physician: Lestine Mount, DO 02/15/2021   Nursing:  02/15/2021   RN Care Manager: 02/15/2021   Social Worker: Verdis Frederickson, [place] 02/15/2021   Recreational Therapist:  02/15/2021   Other:  02/15/2021   Other:  02/15/2021   Other: 02/15/2021     Scribe for Treatment Team: Darleen Crocker, [place] 02/15/2021 2:29 PM
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
## 2021-02-16 DIAGNOSIS — G3184 Mild cognitive impairment, so stated: Secondary | ICD-10-CM

## 2021-02-16 LAB — T3 UPTAKE: T3 Uptake Ratio: 29 % (ref 24–39)

## 2021-02-16 LAB — GLUCOSE, CAPILLARY: Glucose-Capillary: 214 mg/dL — ABNORMAL HIGH (ref 70–99)

## 2021-02-16 MED ORDER — QUETIAPINE FUMARATE 400 MG PO TABS
400.0000 mg | ORAL_TABLET | Freq: Every day | ORAL | Status: DC
  Administered 2021-02-16 – 2021-02-23 (×8): 400 mg via ORAL
  Filled 2021-02-16: qty 1
  Filled 2021-02-16: qty 2
  Filled 2021-02-16: qty 1
  Filled 2021-02-16: qty 7
  Filled 2021-02-16 (×7): qty 1

## 2021-02-16 NOTE — Progress Notes (Signed)
Pt. Didn't attend group. 
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
## 2021-02-16 NOTE — Progress Notes (Signed)
Pt is 1:1 with staff in his room asleep, did not attend.
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
## 2021-02-16 NOTE — Progress Notes (Signed)
Pt is sedated, unable to finish breakfast due to sedation, needed significant assist by MHT to go to the bathroom this morning, refused to cooperate with AM medication administration, for the short periods of time after staff woke pt to offer pt: breakfast, bathroom and meds, pt was very irritable and did not follow directions, required redirections, took a couple of bites of his food, then refused the rest. Pt sleeping in bed on a 1:1 with staff MHT for fall prevention and safety. Will continue to monitor pt per 1:1 for safety, Q15 minute face checks and monitor for safety and progress.

## 2021-02-16 NOTE — Progress Notes (Signed)
Pt denies SI/HI/AVH, has been A&Ox4, with 1:1 sitter for safety and fall prevention, wears gaitbelt, is calm, and cooperative, appetite has been good today. Pt voices no complaints.  Will continue to monitor pt per Q15 minute face checks, 1:1 with sitter and monitor for progress.

## 2021-02-16 NOTE — Progress Notes (Signed)
Pt denies SI/HI/AVH, has been A&Ox4, continues to be on a 1:1 sitter for safety and fall prevention, wears gaitbelt, is calm, and cooperative. Will continue to monitor pt per Q15 minute face checks, 1:1 with sitter and monitor for progress.

## 2021-02-16 NOTE — Evaluation (Addendum)
Occupational Therapy Evaluation Patient Details Name: Emad Brockett MRN: [id number] DOB: June 15, 1956 Today's Date: 02/16/2021    History of Present Illness Patient is a 65 y.o. 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 Patient presented to [facility] ED on 01/29/2021.  The patient was found unresponsive in a local hotel room.  He was brought to the emergency department by emergency medical services.  The patient had apparently made comments earlier of wanting to kill himself. Patient was intubated on 7/31 and extubated on 8/2. Patient transfered to [facility] on 8/11.   Clinical Impression   Mr. Kazumi Brumbley is a 65 year old man currently placed in [place]. On evaluation patient demonstrates the physical abilities to perform functional mobility and ADLs without physical assistance. He had a fall at the facility on 8/12 but today demonstrates the ability to ambulate, speed up, slow down, turn and bend over without overt loss of balance. In regards to his cognition therapist performed the SLUMS Examination and the Pill Box test. On the SLUMS patient scored a 17/30 which is suggestive of a mild neurocognitive disorder. On the clock face patient did not place the numbers correctly around the perimeter but was able to place the clock hands to state the right time. In regards to the pill box test - patient failed. Patient is only technically allowed 5 minutes to perform task but it took him 30 minutes due to distractibility and needing verbal cues. 3 or more medication errors result in a failing score and he had over 11 errors in 30 minutes. Patient needed verbal cues to stay on task, he mixed up his pill bottles, talked  about the pills "clashing" and would stop and hum and then need to reread the pill bottle. Of note patient has a history of a TBI and cognitive deficits are more than likely his baseline. Findings suggest absolutely patient should NOT be in charge of his own medication due to the likelihood of significant medication errors. Errors on the SLUM examination include difficulty with math - suggesting patient may have difficulty managing money. Patient also demonstrated deficits with short term memory recall and attention. Patient's high distractibility makes him impulsive and concerns arise in regards to safety in the home setting - especially in regards to kitchen appliances.   Therapist recommends patient have 24/7 supervision at discharge - recommending home with family, group home or an ALF if that is an option. On discharge, patient may benefit from either [facility] OT or SLP for cognitive retraining.     Follow Up Recommendations  Supervision/Assistance - 24 hour;Home health OT    Equipment Recommendations  None recommended by OT    Recommendations for Other Services       Precautions / Restrictions Precautions Precautions: Fall Precaution Comments: 1 fall at [facility] morning of 8/12 Restrictions Weight Bearing Restrictions: No      Mobility Bed Mobility                    Transfers                      Balance Overall balance assessment: Mild deficits observed, not formally tested Sitting-balance support: No upper extremity supported       Standing  balance support: No upper extremity supported Standing balance-Leahy Scale: Good                             ADL either performed or assessed with clinical judgement   ADL Overall ADL's : Modified independent             General ADL Comments: increased time needed but otherwise no physical assistance to perform.     Vision   Vision Assessment?: No apparent visual deficits     Perception     Praxis       Pertinent Vitals/Pain Pain Assessment: Faces Faces Pain Scale: Hurts a little bit Pain Location: left elbow/forearm Pain Descriptors / Indicators: Aching;Discomfort Pain Intervention(s): Monitored during session     Hand Dominance Right   Extremity/Trunk Assessment Upper Extremity Assessment Upper Extremity Assessment: RUE deficits/detail;LUE deficits/detail RUE Deficits / Details: WFL ROM, 5/5 strength RUE Sensation: WNL RUE Coordination: WNL LUE Deficits / Details: Overall functional ROM, history of elbow/forearm surgery. Lacks elbow extension, overall 4-/5 strength in elbow, forearm and wrist. LUE Sensation:  (numbness in fingers) LUE Coordination: decreased gross motor;decreased fine motor   Lower Extremity Assessment Lower Extremity Assessment: Defer to PT evaluation   Cervical / Trunk Assessment Cervical / Trunk Assessment: Normal   Communication Communication Communication: Expressive difficulties (mild)   Cognition Arousal/Alertness: Awake/alert Behavior During Therapy: WFL for tasks assessed/performed Overall Cognitive Status: No family/caregiver present to determine baseline cognitive functioning Area of Impairment: Orientation;Attention;Memory;Safety/judgement;Awareness;Problem solving                 Orientation Level: Disoriented to;Time Current Attention Level: Focused Memory: Decreased short-term memory Following Commands: Follows one step commands consistently Safety/Judgement: Decreased awareness of deficits Awareness: Intellectual Problem Solving: Requires verbal cues;Difficulty sequencing General Comments: Performed SLUMS and Pill Box test. See note for further details.   General Comments       Exercises     Shoulder Instructions      Home Living Family/patient expects to be discharged to:: Private residence Living Arrangements: Alone Available Help at Discharge: Family;Available PRN/intermittently Type of Home: Other(Comment)  (Motel) Home Access: Stairs to enter Entrance Stairs-Number of Steps: 1 Entrance Stairs-Rails: None Home Layout: One level     Bathroom Shower/Tub: Teacher, early years/pre: Standard Bathroom Accessibility: No   Home Equipment: None          Prior Functioning/Environment Level of Independence: Needs assistance  Gait / Transfers Assistance Needed: pt reports mobilizes with walkign sticks or no device. his mother and sister provide transportation for him ADL's / Homemaking Assistance Needed: sister and mother helped do his laundry            OT Problem List: Decreased safety awareness;Decreased cognition      OT Treatment/Interventions:      OT Goals(Current goals can be found in the care plan section) Acute Rehab OT Goals Patient Stated Goal: Go home. OT Goal Formulation: All assessment and education complete, DC therapy  OT Frequency:     Barriers to D/C:            Co-evaluation              AM-PAC OT "6 Clicks" Daily Activity     Outcome Measure Help from another person eating meals?: None Help from another person taking care of personal grooming?: None Help from another person toileting, which includes using toliet, bedpan, or urinal?: None Help from another person  bathing (including washing, rinsing, drying)?: None Help from another person to put on and taking off regular upper body clothing?: None Help from another person to put on and taking off regular lower body clothing?: None 6 Click Score: 24   End of Session Equipment Utilized During Treatment: Gait belt Nurse Communication:  (cognitive status)  Activity Tolerance: Patient tolerated treatment well Patient left: with nursing/sitter in room  OT Visit Diagnosis: Other symptoms and signs involving cognitive function                Time: [id number] OT Time Calculation (min): 37 min Charges:  OT General Charges $OT Visit: 1 Visit OT Evaluation $OT Eval Moderate Complexity: 1  Mod  Cohan Stipes, OTR/L [place]  Office [phone] Pager: [place] 02/16/2021, 3:03 PM

## 2021-02-16 NOTE — Progress Notes (Signed)
This Probation officer and 1:1 staff were able to place gait belt on him this morning.  Staff continue encourage wearing his gait belt so staff can assist him better.  Within 15 minutes he took it off and refused to wear it. Tech also reported that he was having difficulty using his hands and fingers to pick things up.  Reported to oncoming shift.

## 2021-02-16 NOTE — Progress Notes (Signed)
[facility] Group Notes:  (Nursing/MHT/Case Management/Adjunct)  Date:  02/16/2021  Time:  2015 Type of Therapy:   wrap up group  Participation Level:  Active  Participation Quality:  Appropriate, Attentive, Sharing, and Supportive  Affect:  Appropriate  Cognitive:  Confused  Insight:  Lacking  Engagement in Group:  Supportive  Modes of Intervention:  Clarification, Education, and Support  Summary of Progress/Problems: Pt displayed impulsive, intrusive speech but was redirectable. Positive thinking and self-care were discussed.   [name] 02/16/2021, 9:08 PM

## 2021-02-16 NOTE — Progress Notes (Addendum)
CSW attempted to do an APS report.  CSW had to leave a voicemail with call back information.   APS called back and CSW was able to provide them report.   Clinician agreed that they would call this social worker to let this social worker know if report was screened in or out.     Anika Shore, LCSW, [place] Social Worker  [facility]

## 2021-02-16 NOTE — Progress Notes (Signed)
Pt denies SI/HI/AVH, has been A&Ox4, with 1:1 sitter for safety and fall prevention, wears gaitbelt, is calm, and cooperative. Will continue to monitor pt per Q15 minute face checks, 1:1 with sitter and monitor for progress.

## 2021-02-16 NOTE — Progress Notes (Signed)
Notified by nursing staff that patient is becoming extremely agitated, has been swinging his gait belt at staff and threatening to harm the nursing staff with the gait belt.  Per nursing staff, patient has also been cursing at staff and demanding to be discharged.  Nursing staff also reports that patient walked to the doors of the [facility] hall and was banging on the doors of the [place]. One-time dose of Geodon 20 mg IM ordered for agitation and one-time dose of Geodon 20 mg IM given at 2241 without issue (patient took this medication voluntarily).   Per nursing staff, since patient received his Geodon 20 mg IM dose at 2241, patient has been resting comfortably without issue.  Patient to notify nursing staff if any new symptoms develop or if any issues arise.  Patient to remain on 1:1 observation at this time for safety.

## 2021-02-16 NOTE — Progress Notes (Signed)
D: Patient presents with pleasant affect. Patient is circumstantial in conversation and mildly confused but redirectable. Patient denies SI/HI at this time. Patient also denies AH/VH at this time. Patient contracts for safety.  A: Provided positive reinforcement and encouragement. 1:1 in place for patient safety.  R: Patient cooperative and receptive to efforts. Patient remains safe on the unit under 1:1 observation.   02/16/21 2109  Psych Admission Type (Psych Patients Only)  Admission Status Involuntary  Psychosocial Assessment  Patient Complaints None  Eye Contact Brief  Facial Expression Flat  Affect Flat  Speech Unremarkable  Interaction Assertive  Motor Activity Unsteady  Appearance/Hygiene Poor hygiene;Disheveled  Behavior Characteristics Cooperative;Appropriate to situation  Mood Pleasant  Thought Process  Coherency Circumstantial;Flight of ideas  Content WDL  Delusions None reported or observed  Perception WDL  Hallucination None reported or observed  Judgment Poor  Confusion Mild  Danger to Self  Current suicidal ideation? Denies  Danger to Others  Danger to Others None reported or observed

## 2021-02-16 NOTE — Progress Notes (Signed)
1:1 Note: D:  Pt currently resting with his eyes closed, snoring loudly.   A:  1:1 continues for safety. R:  He remains safe on the unit.

## 2021-02-16 NOTE — Progress Notes (Signed)
1:1 Note: D:  Juan Juarez is resting with his eyes closed and snoring loudly.  He appears to be in no acute distress. A:  1:1 continues for safety with sitter by his side. R:  Juan Juarez remains safe on the unit.    02/15/21 2152  Psych Admission Type (Psych Patients Only)  Admission Status Involuntary  Psychosocial Assessment  Patient Complaints Irritability  Eye Contact Brief  Facial Expression Flat  Affect Flat  Speech Argumentative;Loud;Slurred;Abusive  Interaction Demanding;Hostile;Intrusive  Motor Activity Unsteady  Appearance/Hygiene Poor hygiene;Disheveled  Behavior Characteristics Unwilling to participate;Agressive verbally;Aggressive physically;Agitated;Combative;Elopement risk;Impulsive;Irritable;Pacing  Thought Process  Coherency Circumstantial  Content WDL  Delusions None reported or observed  Perception WDL  Hallucination None reported or observed  Judgment Poor  Confusion Mild  Danger to Self  Current suicidal ideation? Denies  Danger to Others  Danger to Others None reported or observed

## 2021-02-16 NOTE — [facility] Group Notes (Signed)
Pt did not attend group. 

## 2021-02-17 LAB — GLUCOSE, CAPILLARY
Glucose-Capillary: 220 mg/dL — ABNORMAL HIGH (ref 70–99)
Glucose-Capillary: 136 mg/dL — ABNORMAL HIGH (ref 70–99)
Glucose-Capillary: 197 mg/dL — ABNORMAL HIGH (ref 70–99)

## 2021-02-17 NOTE — Progress Notes (Signed)
CSW spoke with patient sister with patient permission, Juan Juarez.  Patient sister reports that patient seemed to be able to take care of himself while at the motel.  Patient sister reports that they have paid for additional days to hold the room if he does end up coming back.  According to sister, patient has been able to take care of finances and sister goes grocery shopping for patient weekly and makes sure that he is supported.  In the past sister has provided transportation to medical appointments.  Sister reports that she is about to have her own medical procedure in September and will be out for 4 to 6 weeks after medical procedure.  Sister reports that she usually works full time but that she has been on medical leave for 11 months and after procedure is expected to go back to work.  Sister reports that she will continue to support patient in whatever way she can but that he would not be able to stay with her due to her upcoming medical procedure and that she will be going back to work.    Patient sister willing to be listed as a caretaker for patient on a PACE referral.  Patient reports that she is most concerned with patient ability to manage his medications as sometimes he can get confused with what time of day to take them.  Patient sister reports that she has tried to assist him in the past with AM/PM pill boxes and other interventions but will sometimes be stubborn and say that he doesn't want help.  Patient can sometimes get confused when interrogated as well. Patient sister aware of an APS report that was made and ability to get connected with additional services for patient to stay safe in his environment.     Jacoya Bauman, LCSW, [place] Social Worker  [facility]

## 2021-02-17 NOTE — Progress Notes (Addendum)
1:1  Note @ 0820 Pt. In room sitting on bed eating breakfast with Male MHT present. Safety maintained. Continue 1:1  1:1 Note  '@1220'$  Pt. In room laying on bed, talking to MHT. Patient safety maintained.  Continue 1:1.   1:1 Note '@1620'$   Pt. In room talking with social work. MHT present. Safety maintained. Continue 1:1.

## 2021-02-17 NOTE — Progress Notes (Addendum)
CSW called patient mother to have ongoing discussion about patient recent assessments and ability for patient to stay with mother. Mother did not answer and CSW unable to leave voicemail.   CSW called later in the day and mother answered.  Patient mother reports that she is older and is on oxygen and has her own health she needs to attend to.  Patient mother reports that her and her daughter try to assist in whatever way they can including bringing meals over and taking him out to eat but that the arrangement at the hotel has been the best for their family.  Patient mother reports that patient has lived with her in the past off and on but he will stay up throughout the night and be disruptive.  Mother reports that he does not like to follow rules and so she is unable to take care of him in the ways that he needs to be taken care of.  Patient mother reports that before he went into the hospital he was able to care for himself well enough, but that she doesn't know how he would be able to care for himself now.  Patient mother reports that patient is agitated on the phone and does not sound as well as he did prior to suicide attempt.    Mother reports that patient continues to call her and request for her to get him out of hospital.  Patient continues to report to mom that he would like to go back to hotel.  Mom reports that they are as supportive as possible and that hotel is down the road from her residence and his daughters residence. Mother also reported that she spoke with APS and provided information about patient as well the ways family can support him. CSW agreed to stay in contact with mother regarding progress, placement and proper supports in place for follow up.    Erastus Bartolomei, LCSW, [place] Social Worker  [facility]

## 2021-02-17 NOTE — Progress Notes (Signed)
Pt asked writer if they knew of any Podiatrist's. Pt told writer that they needed a podiatrist to look at their right toe. Pt states that their toe hurts.

## 2021-02-17 NOTE — Progress Notes (Signed)
Physical Therapy Treatment Patient Details Name: Juan Juarez MRN: [id number] DOB: 07-15-1955 Today's Date: 02/17/2021    History of Present Illness Patient is a 65 y.o. 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 Patient presented to [facility] ED on 01/29/2021.  The patient was found unresponsive in a local hotel room.  He was brought to the emergency department by emergency medical services.  The patient had apparently made comments earlier of wanting to kill himself. Patient was intubated on 7/31 and extubated on 8/2. Patient transfered to [facility] on 8/11.    PT Comments    Patient making good progress with mobility and ambulated with supervision only for ~250'. No sway or stagger evident during gait and pt complete tandem gait challenge and functional exercises for LE strengthening and balance. He will benefit from supervision to ensure stability with ambulation given fall history and from follow up PT if remains in acute setting to re-assess DGI and fall risk. Acute PT will follow during stay.     Follow Up Recommendations  Home health PT     Equipment Recommendations  None recommended by PT (TBA)    Recommendations for Other Services       Precautions / Restrictions Precautions Precautions: Fall Precaution Comments: 1 fall at [facility] morning of 8/12 Restrictions Weight Bearing Restrictions: No    Mobility  Bed Mobility Overal bed mobility: Modified Independent             General bed mobility comments: increased time    Transfers Overall transfer level: Needs assistance Equipment used: Rolling walker (2 wheeled);None Transfers: Sit to/from Stand Sit to Stand: Supervision;Modified  independent (Device/Increase time)         General transfer comment: no obvious balance impairments. Pt peformed repeated sit<>stands during session and remained steady throughout.  Ambulation/Gait Ambulation/Gait assistance: Supervision Gait Distance (Feet): 250 Feet Assistive device: None Gait Pattern/deviations: Step-through pattern;Decreased stride length Gait velocity: fair   General Gait Details: pt gait much steadier compared to prior session, no sway or stagger evident. pt ambualted in hall with no device sueprvision for safety.   Stairs             Wheelchair Mobility    Modified Rankin (Stroke Patients Only)       Balance Overall balance assessment: Needs assistance Sitting-balance support: No upper extremity supported;Feet supported Sitting balance-Leahy Scale: Good     Standing balance support: No upper extremity supported;During functional activity;Bilateral upper extremity supported Standing balance-Leahy Scale: Good   Single Leg Stance - Right Leg: 6 Single Leg Stance - Left Leg: 4         High level balance activites: Sudden stops;Braiding High Level Balance Comments: pt maintained balance with sudden stop/about face during gait. pt also complete tandem gait for ~30' with mild chagnes in gait velocity and 2 staggers but able to correct LOB without assist.            Cognition Arousal/Alertness: Awake/alert Behavior During Therapy: WFL for tasks assessed/performed Overall Cognitive Status: No family/caregiver present to determine baseline cognitive functioning Area of Impairment: Attention;Following commands;Safety/judgement;Awareness;Problem solving;Memory;Orientation                 Orientation Level: Disoriented to;Time Current Attention Level: Sustained Memory: Decreased short-term memory Following  Commands: Follows multi-step commands inconsistently;Follows multi-step commands with increased time;Follows one step commands  consistently Safety/Judgement: Decreased awareness of safety;Decreased awareness of deficits Awareness: Emergent Problem Solving: Difficulty sequencing;Requires verbal cues;Slow processing        Exercises General Exercises - Lower Extremity Heel Raises: AROM;Strengthening;Both;Standing;15 reps;Limitations Heel Raises Limitations: 2x 15 reps bil hand support on counter/shelf Other Exercises Other Exercises: 2x 10 reps Sit<>Stand no UE use for power up Other Exercises: 5 reps bil LE SLS wiht intermittent UE support on counter (hooling for 3-6 seconds)    General Comments        Pertinent Vitals/Pain Pain Assessment: No/denies pain    Home Living                      Prior Function            PT Goals (current goals can now be found in the care plan section) Acute Rehab PT Goals Patient Stated Goal: Go home. PT Goal Formulation: With patient Time For Goal Achievement: 02/24/21 Potential to Achieve Goals: Fair Progress towards PT goals: Progressing toward goals    Frequency    Min 3X/week      PT Plan Current plan remains appropriate    Co-evaluation              AM-PAC PT "6 Clicks" Mobility   Outcome Measure  Help needed turning from your back to your side while in a flat bed without using bedrails?: None Help needed moving from lying on your back to sitting on the side of a flat bed without using bedrails?: None Help needed moving to and from a bed to a chair (including a wheelchair)?: A Little Help needed standing up from a chair using your arms (e.g., wheelchair or bedside chair)?: A Little Help needed to walk in hospital room?: A Little Help needed climbing 3-5 steps with a railing? : A Little 6 Click Score: 20    End of Session Equipment Utilized During Treatment: Gait belt Activity Tolerance: Patient tolerated treatment well Patient left: in bed;with call bell/phone within reach;with nursing/sitter in room Nurse Communication:  Mobility status PT Visit Diagnosis: Unsteadiness on feet (R26.81);Other abnormalities of gait and mobility (R26.89)     Time: [id number] PT Time Calculation (min) (ACUTE ONLY): 17 min  Charges:  $Therapeutic Exercise: 8-22 mins                     Verner Mould, DPT Acute Rehabilitation Services Office [phone] Pager [phone]    Jacques Navy 02/17/2021, 6:53 PM

## 2021-02-17 NOTE — Progress Notes (Addendum)
Juan Juarez, from [place], called this CSW back to get some more information about Juan Juarez.  CSW provided additional information about supports that are in place.  Juan Juarez reports that Juan Juarez would need a caretaker in order for Juan Juarez to be accepted.  Juan Juarez mother unsure if she can be a caretaker.   Juan Juarez reports that if CSW can identify a Juan Juarez caretaker, that they would reconsider the referral.  CSW also discussed that this CSW did an APS report and that if Juan Juarez report was screened in, they can provide [facility] information as a support for Juan Juarez to be properly cared for.   Juan Juarez, from [facility], also recommended [facility] day program if Juan Juarez was interested in that for ongoing support.    Juan Nugent, LCSW, [place] Social Worker  [facility]

## 2021-02-17 NOTE — Progress Notes (Addendum)
[facility] MD Progress Note  02/17/2021 2:43 PM Quadarius Lipuma  MRN:  [id number] Subjective:   Mr. Brisky is a 65 yr old male who presents after a suicide attempt via OD. 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  He reports that he is doing okay today.  He reports that he slept well last night.  He reports his appetite remains good.  He reports no SI, HI, or AVH today.  Discussed with him the findings of OT that recommended that he has 24/7 supervision on discharge.  Upon hearing this, patient is agreeable for the team to reach out to his mother first then his sister for potential living arrangements. He continues to decline ALF.  He did report some mild right great toe pain but declined medication for pain. He reports no other concerns at this time.  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 and substance abuse, Seizure Disorder, 1 previous suicide attempt (OD)  Past Medical History:  Past Medical History:  Diagnosis Date   Anxiety    Chronic elbow pain, left    Depression    Diabetes mellitus, type II ([place])    type II   Gout    Head injury 12/2017   Hepatitis C without hepatic coma    Hypertension    denies   Inflammatory arthropathy    left elbow   Insomnia    Non compliance w medication regimen    Osteoarthritis    Polysubstance abuse ([place])    Prostate cancer ([place])    Protein-calorie malnutrition, severe  ([place]) 09/13/2016   Seizure ([place]) 12/2017   after head Injury   Thrombocytopenia [facility])     Past Surgical History:  Procedure Laterality Date   BURR HOLE Right 01/22/2018   Procedure: RIGHT BURR HOLES;  Surgeon: Newman Pies, MD;  Location: [place];  Service: Neurosurgery;  Laterality: Right;   I & D EXTREMITY Left 11/06/2018   Procedure: IRRIGATION AND DEBRIDEMENT LEFT ELBOW JOINT;  Surgeon: Charlotte Crumb, MD;  Location: [place];  Service: Orthopedics;  Laterality: Left;   INCISION AND DRAINAGE Left 10/24/2018   Procedure: INCISION AND DRAINAGE LEFT ELBOW WITH MASS EXCISION;  Surgeon: Charlotte Crumb, MD;  Location: [place];  Service: Orthopedics;  Laterality: Left;   MULTIPLE TOOTH EXTRACTIONS     None     Family History:  Family History  Problem Relation Age of Onset   Thyroid disease Sister    Colon cancer Neg Hx    Colon polyps Neg Hx    Breast cancer Neg Hx    Prostate cancer Neg Hx    Pancreatic cancer Neg Hx    Family Psychiatric  History: None Reported Social History:  Social History   Substance and Sexual Activity  Alcohol Use Not Currently   Comment: quit 2019     Social History   Substance and Sexual Activity  Drug Use Not Currently   Types: Marijuana, Cocaine   Comment: cocaine last use 10/26/2018, Marijuana sping 2019    Social History  Socioeconomic History   Marital status: Divorced    Spouse name: Not on file   Number of children: Not on file   Years of education: Not on file   Highest education level: Not on file  Occupational History   Occupation: disabled  Tobacco Use   Smoking status: Every Day    Packs/day: 0.50    Years: 50.00    Pack years: 25.00    Types: Cigarettes   Smokeless tobacco: Never  Vaping Use   Vaping Use: Never used  Substance and Sexual Activity   Alcohol use: Not Currently    Comment: quit 2019   Drug use: Not Currently    Types: Marijuana, Cocaine    Comment: cocaine last use 10/26/2018, Marijuana sping 2019    Sexual activity: Not Currently  Other Topics Concern   Not on file  Social History Narrative   Not on file   Social Determinants of Health   Financial Resource Strain: Not on file  Food Insecurity: Not on file  Transportation Needs: Not on file  Physical Activity: Not on file  Stress: Not on file  Social Connections: Not on file    Sleep: Good  Appetite:  Good  Current Medications: Current Facility-Administered Medications  Medication Dose Route Frequency Provider Last Rate Last Admin   acetaminophen (TYLENOL) tablet 650 mg  650 mg Oral Q6H PRN Chalmers Guest, NP   650 mg at 02/11/21 1817   alum & mag hydroxide-simeth (MAALOX/MYLANTA) 200-200-20 MG/5ML suspension 30 mL  30 mL Oral Q4H PRN Chalmers Guest, NP       amLODipine (NORVASC) tablet 5 mg  5 mg Oral Daily Sharma Covert, MD   5 mg at 02/17/21 0819   cloBAZam (ONFI) tablet 10 mg  10 mg Oral QHS Margorie John W, PA-C   10 mg at 02/16/21 2109   feeding supplement (GLUCERNA SHAKE) ([facility]) liquid 237 mL  237 mL Oral BID BM Sharma Covert, MD   237 mL at 02/17/21 1437   hydrOXYzine (ATARAX/VISTARIL) tablet 25 mg  25 mg Oral TID PRN Chalmers Guest, NP   25 mg at 02/15/21 0804   OLANZapine zydis (ZYPREXA) disintegrating tablet 5 mg  5 mg Oral Q8H PRN Harlow Asa, MD       And   LORazepam (ATIVAN) tablet 1 mg  1 mg Oral PRN Harlow Asa, MD       magnesium hydroxide (MILK OF MAGNESIA) suspension 30 mL  30 mL Oral Daily PRN Chalmers Guest, NP       nicotine (NICODERM CQ - dosed in mg/24 hours) patch 21 mg  21 mg Transdermal Daily Sharma Covert, MD   21 mg at 02/17/21 0816   pantoprazole (PROTONIX) EC tablet 40 mg  40 mg Oral Daily Chalmers Guest, NP   40 mg at 02/17/21 [id number]   QUEtiapine (SEROQUEL) tablet 400 mg  400 mg Oral QHS Briant Cedar, MD   400 mg at 02/16/21 2109    Lab Results:  Results for orders placed or performed during the hospital encounter of 02/09/21 (from the past 48 hour(s))   Glucose, capillary     Status: Abnormal   Collection Time: 02/16/21  7:01 AM  Result Value Ref Range   Glucose-Capillary 214 (H) 70 - 99 mg/dL    Comment: Glucose reference range applies only to samples taken after fasting for at least 8 hours.  Glucose, capillary     Status: Abnormal  Collection Time: 02/17/21  5:53 AM  Result Value Ref Range   Glucose-Capillary 220 (H) 70 - 99 mg/dL    Comment: Glucose reference range applies only to samples taken after fasting for at least 8 hours.  Glucose, capillary     Status: Abnormal   Collection Time: 02/17/21  8:06 AM  Result Value Ref Range   Glucose-Capillary 136 (H) 70 - 99 mg/dL    Comment: Glucose reference range applies only to samples taken after fasting for at least 8 hours.    Blood Alcohol level:  Lab Results  Component Value Date   ETH <10 01/29/2021   ETH <10 [id number]    Metabolic Disorder Labs: Lab Results  Component Value Date   HGBA1C 5.8 (H) 01/30/2021   MPG 119.76 01/30/2021   MPG 283.35 09/02/2018   No results found for: PROLACTIN Lab Results  Component Value Date   CHOL 190 02/10/2021   TRIG 160 (H) 02/10/2021   HDL 26 (L) 02/10/2021   CHOLHDL 7.3 02/10/2021   VLDL 32 02/10/2021   LDLCALC 132 (H) 02/10/2021   LDLCALC 69 11/10/2015    Physical Findings: AIMS: Facial and Oral Movements Muscles of Facial Expression: None, normal Lips and Perioral Area: None, normal Jaw: None, normal Tongue: None, normal,Extremity Movements Upper (arms, wrists, hands, fingers): None, normal Lower (legs, knees, ankles, toes): None, normal (Fall precaution, unsteady gait. Walks with care.), Trunk Movements Neck, shoulders, hips: None, normal, Overall Severity Severity of abnormal movements (highest score from questions above): None, normal Incapacitation due to abnormal movements: None, normal Patient's awareness of abnormal movements (rate only patient's report): No Awareness, Dental Status Current problems with  teeth and/or dentures?: No Does patient usually wear dentures?: No  CIWA:  CIWA-Ar Total: 0  Musculoskeletal: Strength & Muscle Tone: untested Gait & Station: shuffle but ambulating without assistance Patient leans: N/A  Psychiatric Specialty Exam:  Presentation  General Appearance: Casual; Fairly Groomed  Eye Contact:Fair  Speech:Mumbling quality but coherent  Speech Volume:Normal  Handedness:Right   Mood and Affect  Mood:Euthymic  Affect:calmer, no longer irritable, moderate   Thought Process  Thought Processes:Linear and more goal directed; less ruminative about discharge  Descriptions of Associations:Intact  Orientation:Full (Time, Place and Person)  Thought Content:Denies AVH, paranoia, or delusions - is not grossly responding to internal/external stimuli on exam  History of Schizophrenia/Schizoaffective disorder:No  Duration of Psychotic Symptoms:No data recorded Hallucinations:Hallucinations: None Ideas of Reference:None  Suicidal Thoughts:Suicidal Thoughts: No Homicidal Thoughts:Homicidal Thoughts: No  Sensorium  Memory: Poor   Judgment:Limited  Insight:Poor   Executive Functions  Concentration:Fair  Attention Span:Fair  Recall:Poor  Fund of Knowledge:Fair  Language:Fair   Psychomotor Activity  Psychomotor Activity:shuffling gait but no evidence of tremors or akathisias   Assets  Assets:Desire for Improvement; Resilience   Sleep  Sleep: Sleep: Good Number of Hours of Sleep: 6.5   Physical Exam: Physical Exam Vitals and nursing note reviewed.  Constitutional:      General: He is not in acute distress.    Appearance: Normal appearance. He is normal weight. He is not ill-appearing or toxic-appearing.  HENT:     Head: Normocephalic and atraumatic.  Pulmonary:     Effort: Pulmonary effort is normal.  Musculoskeletal:     Comments: Right great toe non-tender to palpation - no redness or induration and no STS noted   Neurological:     Mental Status: He is alert.   Review of Systems  Respiratory:  Negative for shortness of breath.   Cardiovascular:  Negative for chest pain.  Gastrointestinal:  Negative for constipation, diarrhea, nausea and vomiting.  Blood pressure 128/82, pulse 72, temperature 98 F (36.7 C), temperature source Oral, resp. rate 18, height 5' 6.75" (1.695 m), weight 83.5 kg, SpO2 97 %. Body mass index is 29.03 kg/m.   Treatment Plan Summary: Daily contact with patient to assess and evaluate symptoms and progress in treatment   Mr. Zanfardino is a 65 yr old male who presents after a suicide attempt via OD. 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     He is doing well today, he had no overnight events.  He was more agreeable to letting us contact his mother and sister for potential living arrangements after the OT evaluation yesterday.  Social work will reach out to his mother and then sister to follow-up on that.  We will not make any medication changes at this time.  We will continue to monitor.     Unspecified bipolar and related d/o by hx (r/o mood d/o secondary to TBI, r/o substance induced mood d/o - cocaine) Mild neurocognitive disorder -Continue Seroquel 400 mg QHS -APS report placed yesterday 8/18    Seizure Disorder: -Continue Onfi 10 mg QHS     HTN: -Continue Amlodipine 5 mg daily     Aspiration Pneumonitis:  -Finished Augmentin 875/125 mg BID (Day 7 of 7) -Sats 97% RA     Nicotine Dependence: -Continue Nicotine patch 21 mg daily     Elevated Glucose - HbgA1c 5.8 prior to admission and continue daily FSBS      Elevated TSH (normalized)     -Continue Protonix 40 mg daily -Continue PRN's: Tylenol, Maalox, Atarax, Milk of Magnesia, Trazodone     Briant Cedar,  MD 02/17/2021, 2:43 PM

## 2021-02-17 NOTE — Progress Notes (Signed)
Patient participated in APS evaluation. APS caseworker Ashby Dawes engaged patient in assessment.    Aylssa Herrig, LCSW, [place] Social Worker  [facility]

## 2021-02-17 NOTE — Progress Notes (Signed)
Recreation Therapy Notes  Date: 8.19.22 Time: 0930 Location: [address] Dayroom  Group Topic: Stress Management   Goal Area(s) Addresses:  Patient will actively participate in stress management techniques presented during session.  Patient will successfully identify benefit of practicing stress management post d/c.   Behavioral Response: Appropriate  Intervention: Relaxation exercise with ambient sound and script   Activity: Guided Imagery. LRT provided education, instruction, and demonstration on practice of visualization via guided imagery. Patient was asked to participate in the technique introduced during session. Patients were given suggestions of ways to access scripts post d/c and encouraged to explore Youtube and other apps available on smartphones, tablets, and computers.  Education:  Stress Management, Discharge Planning.   Education Outcome: Acknowledges education  Clinical Observations/Feedback: Patient actively engaged in technique introduced and expressed no concerns.      Victorino Sparrow, LRT/CTRS         Victorino Sparrow A 02/17/2021 12:31 PM

## 2021-02-17 NOTE — Progress Notes (Signed)
   02/17/21 0804  Vital Signs  Temp 98 F (36.7 C)  Temp Source Oral  Pulse Rate 72  BP 128/82  BP Method Automatic  Oxygen Therapy  SpO2 97 %   D: Patient denies SI/HI/AVH. Patient denies both anxiety and depression. Patient attended group and was social with staff and peers. Patient was pleasant, smiling and made sense in conversation with good eye contact. A:  Patient took scheduled medicine.  Support and encouragement provided Routine safety checks conducted every 15 minutes. Patient  Informed to notify staff with any concerns.   R:  MHT present. 1:1 continues. Safety maintained.

## 2021-02-17 NOTE — [facility] Group Notes (Signed)
Type of Therapy and Topic: Group Therapy: Anger Management   Participation Level:  Active  Description of Group: In this group, patients will learn helpful strategies and techniques to manage anger, express anger in alternative ways, change hostile attitudes, and prevent aggressive acts, such as verbal abuse and violence.This group will be process-oriented and eductional, with patients participating in exploration of their own experiences as well as giving and receiving support and challenge from other group members.  Therapeutic Goals: Patient will learn to manage anger. Patient will learn to stop violence or the threat of violence. Patient will learn to develop self control over thoughts and actions. Patient will receive support and feedback from others  Summary of Patient Progress: Juan Juarez states that one time that he became angry was after his 4th DUI.  He states that he attempted suicide because of it.  He stated that the next time he will choose not to take an overdose of medication and talk to a friend instead.  The Pt accepted the worksheet and participated in the group discussion.

## 2021-02-18 LAB — GLUCOSE, CAPILLARY
Glucose-Capillary: 204 mg/dL — ABNORMAL HIGH (ref 70–99)
Glucose-Capillary: 207 mg/dL — ABNORMAL HIGH (ref 70–99)
Glucose-Capillary: 223 mg/dL — ABNORMAL HIGH (ref 70–99)
Glucose-Capillary: 181 mg/dL — ABNORMAL HIGH (ref 70–99)

## 2021-02-18 MED ORDER — BACITRACIN ZINC 500 UNIT/GM EX OINT
TOPICAL_OINTMENT | Freq: Two times a day (BID) | CUTANEOUS | Status: DC
  Administered 2021-02-18 – 2021-02-22 (×7): 31.5556 via TOPICAL
  Filled 2021-02-18: qty 28.35

## 2021-02-18 MED ORDER — INSULIN ASPART 100 UNIT/ML IJ SOLN
0.0000 [IU] | Freq: Three times a day (TID) | INTRAMUSCULAR | Status: DC
  Administered 2021-02-18: 3 [IU] via SUBCUTANEOUS
  Administered 2021-02-18: 2 [IU] via SUBCUTANEOUS
  Administered 2021-02-19: 3 [IU] via SUBCUTANEOUS
  Administered 2021-02-19 (×2): 2 [IU] via SUBCUTANEOUS
  Administered 2021-02-20 (×2): 3 [IU] via SUBCUTANEOUS
  Administered 2021-02-21: 5 [IU] via SUBCUTANEOUS
  Administered 2021-02-21 (×2): 3 [IU] via SUBCUTANEOUS
  Administered 2021-02-22 (×2): 5 [IU] via SUBCUTANEOUS
  Administered 2021-02-22 – 2021-02-24 (×5): 3 [IU] via SUBCUTANEOUS

## 2021-02-18 MED ORDER — ZIPRASIDONE MESYLATE 20 MG IM SOLR
20.0000 mg | Freq: Once | INTRAMUSCULAR | Status: AC
  Administered 2021-02-18: 20 mg via INTRAMUSCULAR
  Filled 2021-02-18: qty 20

## 2021-02-18 MED ORDER — CEPHALEXIN 500 MG PO CAPS
500.0000 mg | ORAL_CAPSULE | Freq: Four times a day (QID) | ORAL | Status: DC
  Administered 2021-02-18 – 2021-02-24 (×21): 500 mg via ORAL
  Filled 2021-02-18 (×28): qty 1

## 2021-02-18 MED ORDER — ZIPRASIDONE MESYLATE 20 MG IM SOLR
INTRAMUSCULAR | Status: AC
  Filled 2021-02-18: qty 20

## 2021-02-18 NOTE — Progress Notes (Signed)
   Pt is asleep and snoring.  Pt is safe and in no distress.  Pt remains on 1:1 observation for safety.

## 2021-02-18 NOTE — Progress Notes (Signed)
   Pt in dayroom amongst peers.  Pt is not under any distress.  Pt remains on 1:1 observation for safety.

## 2021-02-18 NOTE — Progress Notes (Signed)
  At approximately 2355, MHT informed writer that pt was awake and appeared to bleeding.  Per MHT, pt was refusing to wear gait belt and demanding to go to bathroom without it. Writer witnessed a moderate amount of blood on fitted bed sheet.  Writer witnessed pt on toilet.  Pt began cursing, demanding that I and other staff leave the bathroom.  Writer gave pt time to use bathroom while remaining within pt's sight.  Writer requested provider be notified.  Pt removed shorts and mesh undergarments which were stained with bright red blood.  Pt was asked to stand for assessment.  Pt stated, "I know what it is, it's a blister from that underwear."  After several requests for pt to stand, pt agreed.  On assessment, a skin tear with bright red blood on the inner left buttock was found.  Pt was advised that provider was coming to assess as well.  Pt refused stating, "I'm getting in the shower now."  Pt stated, "I have a whole layer of skin that needs to come off."  Stressed to pt that provider must assess first, pt proceeded to undress, removing safety socks and shirt.  Provider arrived and after a reasonable amount of time allowed to shower, pt repeatedly refused to end shower at writer's request, provider's request, and staff requests.  Pt cursed staff repeatedly, cupped water in his hand and splashed at [place], made numerous attempts to turn shower back on once it was turned off, swinging arms at staff, refusing to exit shower.  The pt consistently refused to cooperate and became more volatile.  A 20 mg Geodon injection was administered by [facility] to the left deltoid.  Writer, CN, and 2 MHTs assisted pt in getting into the wheelchair.  Pt still resisting care, was then wheeled to the bedside, gowned, and dressing applied to skin tear. Writer remained at bedside with MHT/sitter.  Writer exits pt's room and shortly after, pt is demanding snacks, pt is out of the bed walking without gait belt, pt is dressing, looking out  the window, cursing stating, "where is my snack."  Pt seems to believe there is a refrigerator in his room.  Writer directs pt back to bed.  Pt is mumbling when he states, "I wish I could have gotten one good lick in."  Pt hears another pt in the hall and repeatedly says, "Get out why you can!"  Pt is instructed to discontinue with that action.  Pt settles down and writer exits the room.  Pt remains safe on unit with 1:1 observation and Q 15 minute checks.

## 2021-02-18 NOTE — Progress Notes (Signed)
Patient awakes. Staff explain the gate belt needs to go on around his waist . Patient refused o put the gait belt on. Patient refused to allow staff to put the gait belt and puton his white t shirt. RN notify.

## 2021-02-18 NOTE — Progress Notes (Addendum)
[facility] MD Progress Note  02/18/2021 3:31 PM Juan Juarez  MRN:  [id number] Subjective:  Juan Juarez is a 65 yr old male who presents after a suicide attempt via OD. 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 He ws noted to be scratching at his buttock and sitter noticed blood on his sheets. He was assisted with a shower but then became belligerent and refused to cooperate with staff. He made delusional statements and required IM Geodon for agitation.   On assessment this AM patient is seen up and ambulating around the unit w/o assistance but remains on 1:1 for fall precautions. Patient reports his mood is "better" and he reports he is eating and sleeping ok. Patient denies struggling to eat or coughing while eating. Patient reports that he does wish to leave soon, but he is ok for now and feels well taken care of. Patient reports he would love to see his granddaughters in [place], [place] ages 4 and 8.  Patient denies SI, HI, and AVH.   Principal Problem: Bipolar disorder, unspecified ([place]) Diagnosis: Principal Problem:   Bipolar disorder, unspecified ([place]) Active Problems:   Cocaine abuse ([place])   Seizure disorder ([place])   History of traumatic brain injury   Mild neurocognitive disorder  Total Time spent with patient: 15 minutes  Past Psychiatric History: See H&P  Past Medical History:  Past Medical History:  Diagnosis Date   Anxiety    Chronic elbow pain, left    Depression    Diabetes mellitus, type II ([place])    type II   Gout    Head injury 12/2017   Hepatitis C without hepatic coma    Hypertension    denies   Inflammatory arthropathy    left elbow   Insomnia    Non compliance w medication regimen    Osteoarthritis    Polysubstance abuse ([place])     Prostate cancer ([place])    Protein-calorie malnutrition, severe ([place]) 09/13/2016   Seizure ([place]) 12/2017   after head Injury   Thrombocytopenia [facility])     Past Surgical History:  Procedure Laterality Date   BURR HOLE Right 01/22/2018   Procedure: RIGHT BURR HOLES;  Surgeon: Newman Pies, MD;  Location: [place];  Service: Neurosurgery;  Laterality: Right;   I & D EXTREMITY Left 11/06/2018   Procedure: IRRIGATION AND DEBRIDEMENT LEFT ELBOW JOINT;  Surgeon: Charlotte Crumb, MD;  Location: [place];  Service: Orthopedics;  Laterality: Left;   INCISION AND DRAINAGE Left 10/24/2018   Procedure: INCISION AND DRAINAGE LEFT ELBOW WITH MASS EXCISION;  Surgeon: Charlotte Crumb, MD;  Location: [place];  Service: Orthopedics;  Laterality: Left;   MULTIPLE TOOTH EXTRACTIONS     None     Family History:  Family History  Problem Relation Age of Onset   Thyroid disease Sister    Colon cancer Neg Hx    Colon polyps Neg Hx    Breast cancer Neg Hx    Prostate cancer Neg Hx    Pancreatic cancer Neg Hx    Family Psychiatric  History: See H&P Social History:  Social History   Substance and Sexual Activity  Alcohol Use Not Currently   Comment: quit 2019     Social History   Substance and Sexual Activity  Drug Use Not Currently   Types: Marijuana, Cocaine  Comment: cocaine last use 10/26/2018, Marijuana sping 2019    Social History   Socioeconomic History   Marital status: Divorced    Spouse name: Not on file   Number of children: Not on file   Years of education: Not on file   Highest education level: Not on file  Occupational History   Occupation: disabled  Tobacco Use   Smoking status: Every Day    Packs/day: 0.50    Years: 50.00    Pack years: 25.00    Types: Cigarettes   Smokeless tobacco: Never  Vaping Use   Vaping Use: Never used  Substance and Sexual Activity   Alcohol use: Not Currently    Comment: quit 2019   Drug use: Not Currently    Types: Marijuana, Cocaine     Comment: cocaine last use 10/26/2018, Marijuana sping 2019   Sexual activity: Not Currently  Other Topics Concern   Not on file  Social History Narrative   Not on file   Social Determinants of Health   Financial Resource Strain: Not on file  Food Insecurity: Not on file  Transportation Needs: Not on file  Physical Activity: Not on file  Stress: Not on file  Social Connections: Not on file   Additional Social History:                         Sleep: Good  Appetite:  Good  Current Medications: Current Facility-Administered Medications  Medication Dose Route Frequency Provider Last Rate Last Admin   acetaminophen (TYLENOL) tablet 650 mg  650 mg Oral Q6H PRN Chalmers Guest, NP   650 mg at 02/11/21 1817   alum & mag hydroxide-simeth (MAALOX/MYLANTA) 200-200-20 MG/5ML suspension 30 mL  30 mL Oral Q4H PRN Chalmers Guest, NP       amLODipine (NORVASC) tablet 5 mg  5 mg Oral Daily Sharma Covert, MD   5 mg at 02/18/21 1040   bacitracin ointment   Topical BID Harlow Asa, MD   [id number] application at [id number]   cephALEXin (KEFLEX) capsule 500 mg  500 mg Oral Q6H Undrea Archbold E, MD   500 mg at 02/18/21 1240   cloBAZam (ONFI) tablet 10 mg  10 mg Oral QHS Margorie John W, PA-C   10 mg at 02/17/21 2124   feeding supplement (GLUCERNA SHAKE) (GLUCERNA SHAKE) liquid 237 mL  237 mL Oral BID BM Sharma Covert, MD   237 mL at 02/18/21 1342   hydrOXYzine (ATARAX/VISTARIL) tablet 25 mg  25 mg Oral TID PRN Chalmers Guest, NP   25 mg at 02/15/21 0804   insulin aspart (novoLOG) injection 0-9 Units  0-9 Units Subcutaneous TID WC Harlow Asa, MD   3 Units at 02/18/21 1240   OLANZapine zydis (ZYPREXA) disintegrating tablet 5 mg  5 mg Oral Q8H PRN Harlow Asa, MD       And   LORazepam (ATIVAN) tablet 1 mg  1 mg Oral PRN Harlow Asa, MD       magnesium hydroxide (MILK OF MAGNESIA) suspension 30 mL  30 mL Oral Daily PRN Chalmers Guest, NP       nicotine (NICODERM CQ -  dosed in mg/24 hours) patch 21 mg  21 mg Transdermal Daily Sharma Covert, MD   21 mg at 02/18/21 1040   pantoprazole (PROTONIX) EC tablet 40 mg  40 mg Oral Daily Chalmers Guest, NP   40 mg  at 02/18/21 1038   QUEtiapine (SEROQUEL) tablet 400 mg  400 mg Oral QHS Briant Cedar, MD   400 mg at 02/17/21 2124    Lab Results:  Results for orders placed or performed during the hospital encounter of 02/09/21 (from the past 48 hour(s))  Glucose, capillary     Status: Abnormal   Collection Time: 02/17/21  5:53 AM  Result Value Ref Range   Glucose-Capillary 220 (H) 70 - 99 mg/dL    Comment: Glucose reference range applies only to samples taken after fasting for at least 8 hours.  Glucose, capillary     Status: Abnormal   Collection Time: 02/17/21  8:06 AM  Result Value Ref Range   Glucose-Capillary 136 (H) 70 - 99 mg/dL    Comment: Glucose reference range applies only to samples taken after fasting for at least 8 hours.  Glucose, capillary     Status: Abnormal   Collection Time: 02/17/21  8:19 PM  Result Value Ref Range   Glucose-Capillary 197 (H) 70 - 99 mg/dL    Comment: Glucose reference range applies only to samples taken after fasting for at least 8 hours.  Glucose, capillary     Status: Abnormal   Collection Time: 02/18/21  6:28 AM  Result Value Ref Range   Glucose-Capillary 204 (H) 70 - 99 mg/dL    Comment: Glucose reference range applies only to samples taken after fasting for at least 8 hours.   Comment 1 Notify RN   Glucose, capillary     Status: Abnormal   Collection Time: 02/18/21  8:18 AM  Result Value Ref Range   Glucose-Capillary 207 (H) 70 - 99 mg/dL    Comment: Glucose reference range applies only to samples taken after fasting for at least 8 hours.  Glucose, capillary     Status: Abnormal   Collection Time: 02/18/21 12:13 PM  Result Value Ref Range   Glucose-Capillary 223 (H) 70 - 99 mg/dL    Comment: Glucose reference range applies only to samples taken after  fasting for at least 8 hours.    Blood Alcohol level:  Lab Results  Component Value Date   ETH <10 01/29/2021   ETH <10 [id number]    Metabolic Disorder Labs: Lab Results  Component Value Date   HGBA1C 5.8 (H) 01/30/2021   MPG 119.76 01/30/2021   MPG 283.35 09/02/2018   No results found for: PROLACTIN Lab Results  Component Value Date   CHOL 190 02/10/2021   TRIG 160 (H) 02/10/2021   HDL 26 (L) 02/10/2021   CHOLHDL 7.3 02/10/2021   VLDL 32 02/10/2021   LDLCALC 132 (H) 02/10/2021   LDLCALC 69 11/10/2015    Physical Findings: AIMS: Facial and Oral Movements Muscles of Facial Expression: None, normal Lips and Perioral Area: None, normal Jaw: None, normal Tongue: None, normal,Extremity Movements Upper (arms, wrists, hands, fingers): None, normal Lower (legs, knees, ankles, toes): None, normal (Fall precaution, unsteady gait. Walks with care.), Trunk Movements Neck, shoulders, hips: None, normal, Overall Severity Severity of abnormal movements (highest score from questions above): None, normal Incapacitation due to abnormal movements: None, normal Patient's awareness of abnormal movements (rate only patient's report): No Awareness, Dental Status Current problems with teeth and/or dentures?: No Does patient usually wear dentures?: No  CIWA:  CIWA-Ar Total: 0 COWS:     Musculoskeletal: Strength & Muscle Tone: decreased Gait & Station: unsteady - shuffling gait Patient leans: Backward  Psychiatric Specialty Exam:  Presentation  General Appearance: Appropriate for Environment  Eye Contact:Fair  Speech:Mumbling quality  Speech Volume:Normal  Handedness:Right   Mood and Affect  Mood: Euthymic  Affect:Congruent   Thought Process  Thought Processes:Superficially goal directed but concrete  Descriptions of Associations:Intact   Orientation:Full (Time, Place and Person)  Thought Content:Denies AVH and has no acute signs of psychosis or delusions on  exam  History of Schizophrenia/Schizoaffective disorder:No  Duration of Psychotic Symptoms:No data recorded Hallucinations:Hallucinations: None  Ideas of Reference:None  Suicidal Thoughts:Suicidal Thoughts: No  Homicidal Thoughts:Homicidal Thoughts: No   Sensorium  Memory: Impaired  Judgment:Impaired  Insight:Shallow   Executive Functions  Concentration:Poor  Attention Span:Poor  Recall:Poor  Fund of Knowledge:Fair  Language:Fair   Psychomotor Activity  Psychomotor Activity:Psychomotor Activity: Decreased   Assets  Assets:Resilience   Sleep  Sleep:Sleep: Good Number of Hours of Sleep: 6.5    Physical Exam: Physical Exam Constitutional:      Appearance: Normal appearance.  HENT:     Head: Normocephalic and atraumatic.  Eyes:     Extraocular Movements: Extraocular movements intact.     Conjunctiva/sclera: Conjunctivae normal.  Cardiovascular:     Rate and Rhythm: Normal rate.  Pulmonary:     Effort: Pulmonary effort is normal.     Breath sounds: Normal breath sounds.  Abdominal:     General: Abdomen is flat.  Musculoskeletal:        General: Normal range of motion.  Skin:    General: Skin is warm and dry.     Comments: Buttocks pressure wound has a clean barrier bandage placed. No erythmae around the barrier.   Neurological:     General: No focal deficit present.     Mental Status: He is alert.   Review of Systems  Constitutional:  Negative for chills and fever.  HENT:  Negative for hearing loss.        "Throat is scratchy from when they stuck that thing down it"  Eyes:  Negative for blurred vision.  Respiratory:  Negative for cough and wheezing.   Cardiovascular:  Negative for chest pain.  Gastrointestinal:  Negative for abdominal pain.  Neurological:  Negative for dizziness.  Psychiatric/Behavioral:  Negative for suicidal ideas.   Blood pressure 111/60, pulse 67, temperature 98 F (36.7 C), temperature source Oral, resp. rate 18,  height 5' 6.75" (1.695 m), weight 83.5 kg, SpO2 96 %. Body mass index is 29.03 kg/m.   Treatment Plan Summary: Daily contact with patient to assess and evaluate symptoms and progress in treatment  Patient appears stable today. He endorses stable, improved mood. SW continues to work on [place] for patient. APS remains involved.     Unspecified bipolar and related d/o by hx (r/o mood d/o secondary to TBI, r/o substance induced mood d/o - cocaine) Mild neurocognitive disorder -Continue Seroquel 400 mg QHS -APS report placed  8/18 due to concerns for possible sun-downing, gait, and need for 24/7 supervision based on OT assessment and MOCA score   Pressure wound to L buttocks - Keflex '500mg'$  q6h for 7 days - Bacitracin and dressing bid - Wound mgmt via cleaning and barrier placement - Encourage patient to ambulate and rotate off wound when sitting/lying.   Seizure Disorder: -Continue Onfi 10 mg QHS     HTN: -Continue Amlodipine 5 mg daily     Aspiration Pneumonitis:  -Finished Augmentin 875/125 mg BID (Day 7 of 7) -Sats 96% RA     Nicotine Dependence: -Continue Nicotine patch 21 mg daily     Elevated Glucose - HbgA1c 5.8 prior to  admission and restart FSBS with meals and SSI coverage     Elevated TSH (normalized)     -Continue Protonix 40 mg daily -Continue PRN's: Tylenol, Maalox, Atarax, Milk of Magnesia, Trazodone  PGY-2 Freida Busman, MD 02/18/2021, 3:31 PM

## 2021-02-18 NOTE — Progress Notes (Signed)
[place] Group Notes:  (Nursing/MHT/Case Management/Adjunct)  Date:  02/18/2021  Time:  2000 Type of Therapy:   wrap up group  Participation Level:  Active  Participation Quality:  Appropriate, Attentive, and Sharing  Affect:  Flat  Cognitive:  Lacking  Insight:  Lacking  Engagement in Group:  Developing/Improving  Modes of Intervention:  Clarification, Education, and Support  Summary of Progress/Problems: Positive thinking and positive change were discussed.   [name] 02/18/2021, 9:18 PM

## 2021-02-18 NOTE — [facility] Group Notes (Signed)
[place] Group Notes:  (Nursing/MHT/Case Management/Adjunct)  Date:  02/17/2021 Time:  8:00 PM  Type of Therapy:  Group Therapy  Participation Level:  Did Not Attend  Participation Quality:  NA  Affect:  NA  Cognitive:  NA  Insight:  None  Engagement in Group:  NA  Modes of Intervention:  NA  Summary of Progress/Problems:  Juan Juarez 02/17/2021, 8:00 PM

## 2021-02-18 NOTE — Progress Notes (Signed)
   Pt asleep snoring loudly.  Pt is without distress.  Sitter at bedside for 1:1 observation.  Pt remains  Safe with Q 15 safety checks.

## 2021-02-18 NOTE — Plan of Care (Signed)
  Problem: Education: Goal: Emotional status will improve Outcome: Not Progressing Goal: Mental status will improve Outcome: Not Progressing Goal: Verbalization of understanding the information provided will improve Outcome: Not Progressing   

## 2021-02-18 NOTE — [facility] Group Notes (Signed)
Positives, mothers getting better, quit the pain pills, alcohol

## 2021-02-18 NOTE — Plan of Care (Signed)
  Problem: Health Behavior/Discharge Planning: Goal: Compliance with treatment plan for underlying cause of condition will improve Outcome: Progressing   Problem: Coping: Goal: Ability to use eye contact when communicating with others will improve Outcome: Progressing   Problem: Self-Concept: Goal: Will verbalize positive feelings about self Outcome: Not Progressing

## 2021-02-18 NOTE — Progress Notes (Signed)
     02/18/21 2105  Psych Admission Type (Psych Patients Only)  Admission Status Involuntary  Psychosocial Assessment  Patient Complaints None  Eye Contact Brief  Facial Expression Flat  Affect Flat  Speech Unremarkable  Interaction Assertive  Motor Activity Unsteady  Appearance/Hygiene Poor hygiene;Disheveled  Behavior Characteristics Cooperative  Mood Pleasant  Thought Process  Coherency Circumstantial;Flight of ideas  Content WDL  Delusions None reported or observed  Perception WDL  Hallucination None reported or observed  Judgment Poor  Confusion Mild  Danger to Self  Current suicidal ideation? Denies  Danger to Others  Danger to Others None reported or observed

## 2021-02-18 NOTE — Progress Notes (Deleted)
   Pt in dayroom amongst peers.  Pt is not under any distress.  Pt remains on 1:1 observation for safety.

## 2021-02-18 NOTE — Progress Notes (Addendum)
  Pt denies anxiety, depression, SI/HI, and AVH.  Pt verbally contracts for safety and has no complaints.    02/17/21 2124  Psych Admission Type (Psych Patients Only)  Admission Status Involuntary  Psychosocial Assessment  Patient Complaints None  Eye Contact Brief  Facial Expression Flat  Affect Flat  Speech Unremarkable  Interaction Assertive  Motor Activity Unsteady  Appearance/Hygiene Poor hygiene;Disheveled  Behavior Characteristics Cooperative  Mood Anxious  Thought Process  Coherency Circumstantial;Flight of ideas  Content WDL  Delusions None reported or observed  Perception WDL  Hallucination None reported or observed  Judgment Poor  Confusion Mild  Danger to Self  Current suicidal ideation? Denies  Danger to Others  Danger to Others None reported or observed

## 2021-02-18 NOTE — Progress Notes (Signed)
Patient was laying on the bed resting.. Patient get up from the bed and sad he wants to watch the sun rise. Staff expain to the patient he will need to wait until after 7am to see the sun rise. Staff explain to the patient the gait belt has to be on at all times. Patient said he do not need no damn gait belt. Staff explain every time you move from the bed the gait belt has to be around your waist. Patient refused. Patient used profanity several times. Staff asked for assistance with the patient. Patient appeared to be angry. Patient went to the bathroom.Patient ussed the bathroom  The nurse as well as other staff members assist Juan Juarez in the bathroom. Juan Juarez insist he take a shower. There was bright red blood on the fitted sheet lying on the bed. . Staff removed the dirty lien from the bed wipe the bed with wipes and put clean lien on the bed. The patient was taking a shower for extended period time. The nurse, charge nurse NP and other staff members assist patient from the shower to the wheel chair to the bed. Patient appeared to be resistance getting out of the shower begin to use profanity.  and going to the bed. Patient took off the dirty clothes. Staff put in the dirty clothes in the washer and dryer

## 2021-02-18 NOTE — Progress Notes (Signed)
Pt did not attend group. 

## 2021-02-18 NOTE — Progress Notes (Addendum)
At about 0010, staff alerted this writer to situation with this pt. Pt had gotten up and sitter noticed blood in the bed. Pt now in the shower and refusing to get out or allow staff to see where bleeding may be coming from. Sitter states that pt had been scratching his buttock area. Provider and [facility] notified. Pt belligerent, cursing and fighting staff as he refuses to get out of shower. Splashing water on staff members. Refusing to cooperate. 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 Without me, you wouldn't have jobs." Given order for Geodon 20 mg. Injection given in left deltoid. Pt then assisted out of bath chair to wheelchair. Pt refusing to put clothing on and assisted into gown and out of wheelchair. Quarter size area on inner left buttock was covered with small Mepilex dressing. Pt assisted to get in bed. Pt's nurse at bedside to provide emotional support to pt. Sitter back at bedside. Pt still resistant to care at this point. Will continue to monitor.

## 2021-02-19 LAB — GLUCOSE, CAPILLARY
Glucose-Capillary: 151 mg/dL — ABNORMAL HIGH (ref 70–99)
Glucose-Capillary: 222 mg/dL — ABNORMAL HIGH (ref 70–99)
Glucose-Capillary: 183 mg/dL — ABNORMAL HIGH (ref 70–99)

## 2021-02-19 NOTE — Progress Notes (Signed)
[place] Group Notes:  (Nursing/MHT/Case Management/Adjunct)  Date:  02/19/2021  Time:  2000  Type of Therapy:   wrap up group  Participation Level:  Active  Participation Quality:  Sharing and Supportive  Affect:  Depressed  Cognitive:  Lacking  Insight:  Lacking  Engagement in Group:  Developing/Improving  Modes of Intervention:  Clarification, Education, and Support  Summary of Progress/Problems: Positive thinking and self-care were discussed.   Juan Juarez 02/19/2021, 11:30 PM

## 2021-02-19 NOTE — Progress Notes (Signed)
1:1  Note   Patient continue to be observed on 1:1 No behavior  issues this shift. Compliant with all fall protocols. Compliant with medications. Visible in [place] this afternoon. Denies SI/HI/A/VH.  Support and encouragement provided as needed. Patient remains safe.

## 2021-02-19 NOTE — Progress Notes (Signed)
   Patient is asleep in bed.  Patient respirations are even and unlabored.  Patient remains on 1:1 observation for safety.  Pt is safe on unit with Q 15 minute safety checks.

## 2021-02-19 NOTE — Progress Notes (Signed)
Pt didn't attend group. 

## 2021-02-19 NOTE — Progress Notes (Signed)
1:1 NOTE  Patient in room this am c/o anxiety 4/10 Prn vistaril 25 mg PO given at 0914 and reported effective by 1000 Denies SI/HI/A/VH and verbally contracted for safety.  1:1 Observation ongoing for Safety. All fall protocol in place and maintained. Patient compliant with medications no adverse effects noted. Support and encouragement provided.

## 2021-02-19 NOTE — Progress Notes (Signed)
Patient is in room, asleep in bed.  Patient is free from any distress and resting.  Patient remains on 1:1 observation for safety.  Pt is safe on unit with Q 15 minute safety checks.

## 2021-02-19 NOTE — Progress Notes (Addendum)
[facility] MD Progress Note  02/19/2021 3:39 PM Juan Juarez  MRN:  [id number] Subjective:   Mr. Alsop is a 65 yr old male who presents after a suicide attempt via OD. 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  Patient had no ON events. On assessment this AM patient reports he is doing well and endorses that he is feeling satisfied with the care he is receiving. Patient reports he showered but did not take off his bandage for his pressure wound. Patient reports he is eating ans sleeping well. Patient denies SI,HI, and AVH. Patient remains on 1:1 for fall precautions.  Principal Problem: Bipolar disorder, unspecified ([place]) Diagnosis: Principal Problem:   Bipolar disorder, unspecified ([place]) Active Problems:   Cocaine abuse ([place])   Seizure disorder ([place])   History of traumatic brain injury   Mild neurocognitive disorder  Total Time spent with patient: 15 minutes  Past Psychiatric History: See H&P  Past Medical History:  Past Medical History:  Diagnosis Date   Anxiety    Chronic elbow pain, left    Depression    Diabetes mellitus, type II ([place])    type II   Gout    Head injury 12/2017   Hepatitis C without hepatic coma    Hypertension    denies   Inflammatory arthropathy    left elbow   Insomnia    Non compliance w medication regimen    Osteoarthritis    Polysubstance abuse ([place])    Prostate cancer ([place])    Protein-calorie malnutrition, severe ([place]) 09/13/2016   Seizure ([place]) 12/2017   after head Injury   Thrombocytopenia [facility])     Past Surgical History:  Procedure Laterality Date   BURR HOLE Right 01/22/2018   Procedure: RIGHT BURR HOLES;  Surgeon: Newman Pies, MD;  Location: [place];  Service: Neurosurgery;  Laterality: Right;   I & D EXTREMITY Left 11/06/2018   Procedure: IRRIGATION AND DEBRIDEMENT  LEFT ELBOW JOINT;  Surgeon: Charlotte Crumb, MD;  Location: [place];  Service: Orthopedics;  Laterality: Left;   INCISION AND DRAINAGE Left 10/24/2018   Procedure: INCISION AND DRAINAGE LEFT ELBOW WITH MASS EXCISION;  Surgeon: Charlotte Crumb, MD;  Location: [place];  Service: Orthopedics;  Laterality: Left;   MULTIPLE TOOTH EXTRACTIONS     None     Family History:  Family History  Problem Relation Age of Onset   Thyroid disease Sister    Colon cancer Neg Hx    Colon polyps Neg Hx    Breast cancer Neg Hx    Prostate cancer Neg Hx    Pancreatic cancer Neg Hx    Family Psychiatric  History: See H&P Social History:  Social History   Substance and Sexual Activity  Alcohol Use Not Currently   Comment: quit 2019     Social History   Substance and Sexual Activity  Drug Use Not Currently   Types: Marijuana, Cocaine   Comment: cocaine last use 10/26/2018, Marijuana sping 2019    Social History   Socioeconomic History   Marital status: Divorced    Spouse name: Not on file   Number of children: Not on file   Years of education: Not on file   Highest education level: Not on file  Occupational History   Occupation: disabled  Tobacco Use   Smoking status: Every Day    Packs/day: 0.50  Years: 50.00    Pack years: 25.00    Types: Cigarettes   Smokeless tobacco: Never  Vaping Use   Vaping Use: Never used  Substance and Sexual Activity   Alcohol use: Not Currently    Comment: quit 2019   Drug use: Not Currently    Types: Marijuana, Cocaine    Comment: cocaine last use 10/26/2018, Marijuana sping 2019   Sexual activity: Not Currently  Other Topics Concern   Not on file  Social History Narrative   Not on file   Social Determinants of Health   Financial Resource Strain: Not on file  Food Insecurity: Not on file  Transportation Needs: Not on file  Physical Activity: Not on file  Stress: Not on file  Social Connections: Not on file   Additional Social History:                          Sleep: Good  Appetite:  Good  Current Medications: Current Facility-Administered Medications  Medication Dose Route Frequency Provider Last Rate Last Admin   acetaminophen (TYLENOL) tablet 650 mg  650 mg Oral Q6H PRN Chalmers Guest, NP   650 mg at 02/11/21 1817   alum & mag hydroxide-simeth (MAALOX/MYLANTA) 200-200-20 MG/5ML suspension 30 mL  30 mL Oral Q4H PRN Chalmers Guest, NP       amLODipine (NORVASC) tablet 5 mg  5 mg Oral Daily Sharma Covert, MD   5 mg at 02/19/21 0900   bacitracin ointment   Topical BID Harlow Asa, MD   [id number] application at [id number] 1152   cephALEXin (KEFLEX) capsule 500 mg  500 mg Oral Q6H Darrell Leonhardt E, MD   500 mg at 02/19/21 1141   cloBAZam (ONFI) tablet 10 mg  10 mg Oral QHS Margorie John W, PA-C   10 mg at 02/18/21 2104   feeding supplement (GLUCERNA SHAKE) (GLUCERNA SHAKE) liquid 237 mL  237 mL Oral BID BM Sharma Covert, MD   237 mL at 02/19/21 1428   hydrOXYzine (ATARAX/VISTARIL) tablet 25 mg  25 mg Oral TID PRN Chalmers Guest, NP   25 mg at 02/19/21 0914   insulin aspart (novoLOG) injection 0-9 Units  0-9 Units Subcutaneous TID WC Harlow Asa, MD   3 Units at 02/19/21 1153   OLANZapine zydis (ZYPREXA) disintegrating tablet 5 mg  5 mg Oral Q8H PRN Harlow Asa, MD       And   LORazepam (ATIVAN) tablet 1 mg  1 mg Oral PRN Harlow Asa, MD       magnesium hydroxide (MILK OF MAGNESIA) suspension 30 mL  30 mL Oral Daily PRN Chalmers Guest, NP       nicotine (NICODERM CQ - dosed in mg/24 hours) patch 21 mg  21 mg Transdermal Daily Sharma Covert, MD   21 mg at 02/19/21 1100   pantoprazole (PROTONIX) EC tablet 40 mg  40 mg Oral Daily Chalmers Guest, NP   40 mg at 02/19/21 0900   QUEtiapine (SEROQUEL) tablet 400 mg  400 mg Oral QHS Briant Cedar, MD   400 mg at 02/18/21 2105    Lab Results:  Results for orders placed or performed during the hospital encounter of 02/09/21 (from the past 48  hour(s))  Glucose, capillary     Status: Abnormal   Collection Time: 02/17/21  8:19 PM  Result Value Ref Range   Glucose-Capillary 197 (H) 70 -  99 mg/dL    Comment: Glucose reference range applies only to samples taken after fasting for at least 8 hours.  Glucose, capillary     Status: Abnormal   Collection Time: 02/18/21  6:28 AM  Result Value Ref Range   Glucose-Capillary 204 (H) 70 - 99 mg/dL    Comment: Glucose reference range applies only to samples taken after fasting for at least 8 hours.   Comment 1 Notify RN   Glucose, capillary     Status: Abnormal   Collection Time: 02/18/21  8:18 AM  Result Value Ref Range   Glucose-Capillary 207 (H) 70 - 99 mg/dL    Comment: Glucose reference range applies only to samples taken after fasting for at least 8 hours.  Glucose, capillary     Status: Abnormal   Collection Time: 02/18/21 12:13 PM  Result Value Ref Range   Glucose-Capillary 223 (H) 70 - 99 mg/dL    Comment: Glucose reference range applies only to samples taken after fasting for at least 8 hours.  Glucose, capillary     Status: Abnormal   Collection Time: 02/18/21  5:11 PM  Result Value Ref Range   Glucose-Capillary 181 (H) 70 - 99 mg/dL    Comment: Glucose reference range applies only to samples taken after fasting for at least 8 hours.  Glucose, capillary     Status: Abnormal   Collection Time: 02/19/21  6:55 AM  Result Value Ref Range   Glucose-Capillary 151 (H) 70 - 99 mg/dL    Comment: Glucose reference range applies only to samples taken after fasting for at least 8 hours.  Glucose, capillary     Status: Abnormal   Collection Time: 02/19/21 11:40 AM  Result Value Ref Range   Glucose-Capillary 222 (H) 70 - 99 mg/dL    Comment: Glucose reference range applies only to samples taken after fasting for at least 8 hours.    Blood Alcohol level:  Lab Results  Component Value Date   ETH <10 01/29/2021   ETH <10 [id number]    Metabolic Disorder Labs: Lab Results   Component Value Date   HGBA1C 5.8 (H) 01/30/2021   MPG 119.76 01/30/2021   MPG 283.35 09/02/2018   No results found for: PROLACTIN Lab Results  Component Value Date   CHOL 190 02/10/2021   TRIG 160 (H) 02/10/2021   HDL 26 (L) 02/10/2021   CHOLHDL 7.3 02/10/2021   VLDL 32 02/10/2021   LDLCALC 132 (H) 02/10/2021   LDLCALC 69 11/10/2015    Physical Findings: AIMS: Facial and Oral Movements Muscles of Facial Expression: None, normal Lips and Perioral Area: None, normal Jaw: None, normal Tongue: None, normal,Extremity Movements Upper (arms, wrists, hands, fingers): None, normal Lower (legs, knees, ankles, toes): None, normal (Fall precaution, unsteady gait. Walks with care.), Trunk Movements Neck, shoulders, hips: None, normal, Overall Severity Severity of abnormal movements (highest score from questions above): None, normal Incapacitation due to abnormal movements: None, normal Patient's awareness of abnormal movements (rate only patient's report): No Awareness, Dental Status Current problems with teeth and/or dentures?: No Does patient usually wear dentures?: No  CIWA:  CIWA-Ar Total: 0 COWS:     Musculoskeletal: Strength & Muscle Tone: decreased Gait & Station: unsteady but ambulates on his own Patient leans: N/A  Psychiatric Specialty Exam:  Presentation  General Appearance: Appropriate for Environment  Eye Contact:Good  Speech:mumbling quality  Speech Volume:Normal  Handedness:Right   Mood and Affect  Mood:Euthymic  Affect:Congruent   Thought Process  Thought Processes:superficially  goal directed but concrete  Descriptions of Associations:Intact  Orientation:Full (Time, Place and Person)  Thought Content:Denies AVH, paranoia, or delusions, no acute psychosis on exam  History of Schizophrenia/Schizoaffective disorder:No  Duration of Psychotic Symptoms:No data recorded Hallucinations:Hallucinations: None  Ideas of Reference:None  Suicidal  Thoughts:Suicidal Thoughts: No  Homicidal Thoughts:Homicidal Thoughts: No   Sensorium  Memory:Poor  Judgment:Impaired  Insight:Shallow   Executive Functions  Concentration:Fair  Attention Span:Fair  Recall:Poor  Fund of Knowledge:Limited  Language:Fair   Psychomotor Activity  Psychomotor Activity:Psychomotor Activity: Normal   Assets  Assets:Communication Skills; Resilience   Sleep  Sleep:Sleep: Good    Physical Exam: Physical Exam Vitals reviewed.  Constitutional:      Appearance: Normal appearance.  HENT:     Head: Normocephalic.  Pulmonary:     Effort: Pulmonary effort is normal.  Neurological:     General: No focal deficit present.     Mental Status: He is alert.   Review of Systems  Constitutional:  Negative for chills and fever.  HENT:  Negative for hearing loss.   Eyes:  Negative for blurred vision.  Respiratory:  Negative for cough and wheezing.   Cardiovascular:  Negative for chest pain.  Gastrointestinal:  Negative for abdominal pain.  Neurological:  Negative for dizziness.  Psychiatric/Behavioral:  Negative for depression and suicidal ideas.   Blood pressure 134/80, pulse 84, temperature 98.2 F (36.8 C), temperature source Oral, resp. rate 18, height 5' 6.75" (1.695 m), weight 83.5 kg, SpO2 96 %. Body mass index is 29.03 kg/m.   Treatment Plan Summary: Daily contact with patient to assess and evaluate symptoms and progress in treatment Unspecified bipolar and related d/o by hx (r/o mood d/o secondary to TBI, r/o substance induced mood d/o - cocaine) Mild neurocognitive disorder -Continue Seroquel 400 mg QHS for impulsivity/irritabilty -APS report placed  8/18 due to concerns for possible sun-downing, gait issues, and need for 24/7 supervision based on OT assessment and MOCA score   Pressure wound to L buttocks - Keflex '500mg'$  q6h for 7 days - Bacitracin and dressing bid - Wound mgmt via cleaning and barrier placement - Encourage  patient to ambulate and rotate off wound when sitting/lying.   Seizure Disorder: -Continue Onfi 10 mg QHS     HTN: -Continue Amlodipine 5 mg daily     Aspiration Pneumonitis:  -Finished Augmentin 875/125 mg BID (Day 7 of 7) -Sats 96% RA     Nicotine Dependence: -Continue Nicotine patch 21 mg daily     Elevated Glucose - HbgA1c 5.8 prior to admission and restart FSBS with meals and SSI coverage (recent blood sugars 222, 151, 181)  PGY-2 Freida Busman, MD 02/19/2021, 3:39 PM

## 2021-02-20 LAB — GLUCOSE, CAPILLARY
Glucose-Capillary: 201 mg/dL — ABNORMAL HIGH (ref 70–99)
Glucose-Capillary: 214 mg/dL — ABNORMAL HIGH (ref 70–99)

## 2021-02-20 NOTE — Progress Notes (Signed)
Psychoeducational Group Note  Date:  02/20/2021 Time:  1840  Group Topic/Focus:  Goals Group:   The focus of this group is to help patients establish daily goals to achieve during treatment and discuss how the patient can incorporate goal setting into their daily lives to aide in recovery.  Participation Level: Did Not Attend  Participation Quality:  Not Applicable  Affect:  Not Applicable  Cognitive:  Not Applicable  Insight:  Not Applicable  Engagement in Group: Not Applicable  Additional Comments:  Pt refused to attend group this morning.  Merilyn Pagan E 02/20/2021, 6:32 PM

## 2021-02-20 NOTE — Progress Notes (Signed)
CSW faxed additional notes at request of APS caseworker regarding patient OT evaluations and MOCA scores.    Moxon Messler, LCSW, [place] Social Worker  [facility]

## 2021-02-20 NOTE — Progress Notes (Addendum)
Pt refused to have his finger pricked for CBG this evening. Pt presented as irritable, stating, "I'm tired of this. You all lied to me. You're not getting any of my blood". Two more attempts were made, by MHT and another RN, but pt continued to refuse.  Pt remains safe with Q 15 min checks and 1:1 observation

## 2021-02-20 NOTE — Progress Notes (Signed)
1:1 Nursing note   Pt observed sitting in the dayroom with peers with MHT sitter present. No behavioral issues noted on the unit. Pt remains safe with Q 15 min checks and 1:1 sitter for fall precautions.

## 2021-02-20 NOTE — Progress Notes (Signed)
D: Patient presents with appropriate affect at time of assessment. Patient denies SI/HI at this time. Patient also denies AH/VH at this time. Patient became argumentative with sitter at bedtime when asked to lie down. Patient continued to pace back and forth in room despite redirection from Retail banker. A: Provided positive reinforcement and encouragement. 1:1 observation in place for safety.  R: Patient not cooperative and receptive to efforts. Patient remains safe under 1:1 observation.  02/19/21 2125  Psych Admission Type (Psych Patients Only)  Admission Status Involuntary  Psychosocial Assessment  Patient Complaints None  Eye Contact Brief  Facial Expression Flat  Affect Appropriate to circumstance  Speech Logical/coherent;Argumentative  Interaction Assertive  Motor Activity Unsteady  Appearance/Hygiene Improved  Behavior Characteristics Appropriate to situation  Mood Pleasant  Thought Process  Coherency Circumstantial;Flight of ideas  Content WDL  Delusions None reported or observed  Perception WDL  Hallucination None reported or observed  Judgment Poor  Confusion Mild  Danger to Self  Current suicidal ideation? Denies  Danger to Others  Danger to Others None reported or observed

## 2021-02-20 NOTE — Progress Notes (Signed)
Recreation Therapy Notes  Date: 8.22.22 Time: 0930 Location: [address] Dayroom   Group Topic: Stress Management    Goal Area(s) Addresses:  Patient will actively participate in stress management techniques presented during session.  Patient will successfully identify benefit of practicing stress management post d/c.    Intervention: Relaxation exercise with ambient sound and script   Activity: Guided Imagery. LRT provided education, instruction, and demonstration on practice of visualization via guided imagery. Patient was asked to participate in the technique introduced during session.  Patients were given suggestions of ways to access scripts post d/c and encouraged to explore Youtube and other apps available on smartphones, tablets, and computers.   Education:  Stress Management, Discharge Planning.    Education Outcome:  Acknowledges education   Clinical Observations/Feedback: Patient did not attend group session.         Victorino Sparrow, LRT/CTRS         Victorino Sparrow A 02/20/2021 12:22 PM

## 2021-02-20 NOTE — BHH Group Notes (Signed)
Type of Therapy and Topic:  Group Therapy: Anger and Coping Skills   Participation Level:  Did not attend      Description of Group:   In this group, patients learned how to recognize the physical, cognitive, emotional, and behavioral responses they have to anger-provoking situations.  They identified how they usually or often react when angered, and learned how healthy and unhealthy coping skills work initially, but the unhealthy ones stop working.   They analyzed how their frequently-chosen coping skill is possibly beneficial and how it is possibly unhelpful.  The group discussed a variety of healthier coping skills that could help in resolving the actual issues, as well as how to go about planning for the the possibility of future similar situations.   Therapeutic Goals: Patients will identify one thing that makes them angry and how they feel emotionally and physically, what their thoughts are or tend to be in those situations, and what healthy or unhealthy coping mechanism they typically use Patients will identify how their coping technique works for them, as well as how it works against them. Patients will explore possible new behaviors to use in future anger situations. Patients will learn that anger itself is normal and cannot be eliminated, and that healthier coping skills can assist with resolving conflict rather than worsening situations.   Summary of Patient Progress:  Did not attend

## 2021-02-20 NOTE — BHH Group Notes (Signed)
Pt did not attend AA meeting this evening, due to agitation.

## 2021-02-20 NOTE — Progress Notes (Signed)
Inpatient Diabetes Program Recommendations  AACE/ADA: New Consensus Statement on Inpatient Glycemic Control (2015)  Target Ranges:  Prepandial:   less than 140 mg/dL      Peak postprandial:   less than 180 mg/dL (1-2 hours)      Critically ill patients:  140 - 180 mg/dL   Lab Results  Component Value Date   GLUCAP 214 (H) 02/20/2021   HGBA1C 5.8 (H) 01/30/2021   Results for MIKHAL, WICKWIRE" (MRN [id number]) as of 02/20/2021 14:00  Ref. Range 02/19/2021 06:55 02/19/2021 11:40 02/19/2021 16:35 02/20/2021 06:32 02/20/2021 11:59  Glucose-Capillary Latest Ref Range: 70 - 99 mg/dL 151 (H) 222 (H) 183 (H) 201 (H) 214 (H)   Review of Glycemic Control  Diabetes history: type 2 Outpatient Diabetes medications: Levemir 40 units daily, Metformin 500 mg BID, Glucotrol 5 mg daily Current orders for Inpatient glycemic control: Novolog SENSITIVE correction scale TID  Inpatient Diabetes Program Recommendations:   Noted that patient was on Levemir 40 units daily at home along with Metformin and Glucotrol.  Recommend Levemir 15 units daily if blood sugars continue to be greater than 180 mg/dl. Titrate dosages as needed. Hgba1c is 5.8% which may indicate patient was having low blood sugars at home.   Will continue to monitor blood sugars while inpatient.  Harvel Ricks RN BSN CDE Diabetes Coordinator Pager: [phone]  8am-5pm

## 2021-02-20 NOTE — Progress Notes (Addendum)
[facility] MD Progress Note  02/20/2021 1:29 PM Juan Juarez  MRN:  [id number] Subjective:   Juan Juarez is a 65 yr old male who presents after a suicide attempt via OD. 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   He reports that he is doing well today.  He reports that he slept good last night.  He reports that his appetite remains good.  He reports no SI, HI, or AVH.  Asked him about his argument last night with the tech.  He reports that he was trying to exercise get some walking in, however, because it was late he could not walk up and down the hallway of the unit he instead was pacing back-and-forth in his room and that that was why he got into an argument with the tech.  He reports that otherwise his day yesterday was good and he is feeling good today.  Discussed that we were still talking with his family about arrangements once he discharged.  He reports that again his mother and sister live within about a mile or so of him and are always able to help him.  He reports that his children are ready to help him.  Discussed that the recommendation was 24/7 supervision.  Discussed that we would await the judgment of the APS report.  He reports no concerns with his medications.  He reports no other concerns at this time.   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 and substance abuse, Seizure Disorder, 1 previous suicide attempt  (OD)  Past Medical History:  Past Medical History:  Diagnosis Date   Anxiety    Chronic elbow pain, left    Depression    Diabetes mellitus, type II ([place])    type II   Gout    Head injury 12/2017   Hepatitis C without hepatic coma    Hypertension    denies   Inflammatory arthropathy    left elbow   Insomnia    Non compliance w medication regimen    Osteoarthritis    Polysubstance abuse ([place])    Prostate cancer ([place])    Protein-calorie malnutrition, severe ([place]) 09/13/2016   Seizure ([place]) 12/2017   after head Injury   Thrombocytopenia [facility])     Past Surgical History:  Procedure Laterality Date   BURR HOLE Right 01/22/2018   Procedure: RIGHT BURR HOLES;  Surgeon: Newman Pies, MD;  Location: [place];  Service: Neurosurgery;  Laterality: Right;   I & D EXTREMITY Left 11/06/2018   Procedure: IRRIGATION AND DEBRIDEMENT LEFT ELBOW JOINT;  Surgeon: Charlotte Crumb, MD;  Location: [place];  Service: Orthopedics;  Laterality: Left;   INCISION AND DRAINAGE Left 10/24/2018   Procedure: INCISION AND DRAINAGE LEFT ELBOW WITH MASS EXCISION;  Surgeon: Charlotte Crumb, MD;  Location: [place];  Service: Orthopedics;  Laterality: Left;   MULTIPLE TOOTH EXTRACTIONS     None     Family History:  Family History  Problem Relation Age of Onset   Thyroid disease Sister    Colon cancer  Neg Hx    Colon polyps Neg Hx    Breast cancer Neg Hx    Prostate cancer Neg Hx    Pancreatic cancer Neg Hx    Family Psychiatric  History: None Reported Social History:  Social History   Substance and Sexual Activity  Alcohol Use Not Currently   Comment: quit 2019     Social History   Substance and Sexual Activity  Drug Use Not Currently   Types: Marijuana, Cocaine   Comment: cocaine last use 10/26/2018, Marijuana sping 2019    Social History   Socioeconomic History   Marital status: Divorced    Spouse name: Not on file   Number of children: Not on file   Years of education: Not on file    Highest education level: Not on file  Occupational History   Occupation: disabled  Tobacco Use   Smoking status: Every Day    Packs/day: 0.50    Years: 50.00    Pack years: 25.00    Types: Cigarettes   Smokeless tobacco: Never  Vaping Use   Vaping Use: Never used  Substance and Sexual Activity   Alcohol use: Not Currently    Comment: quit 2019   Drug use: Not Currently    Types: Marijuana, Cocaine    Comment: cocaine last use 10/26/2018, Marijuana sping 2019   Sexual activity: Not Currently  Other Topics Concern   Not on file  Social History Narrative   Not on file   Social Determinants of Health   Financial Resource Strain: Not on file  Food Insecurity: Not on file  Transportation Needs: Not on file  Physical Activity: Not on file  Stress: Not on file  Social Connections: Not on file    Sleep: Fair  Appetite:  Good  Current Medications: Current Facility-Administered Medications  Medication Dose Route Frequency Provider Last Rate Last Admin   acetaminophen (TYLENOL) tablet 650 mg  650 mg Oral Q6H PRN Chalmers Guest, NP   650 mg at 02/11/21 1817   alum & mag hydroxide-simeth (MAALOX/MYLANTA) 200-200-20 MG/5ML suspension 30 mL  30 mL Oral Q4H PRN Chalmers Guest, NP       amLODipine (NORVASC) tablet 5 mg  5 mg Oral Daily Sharma Covert, MD   5 mg at 02/20/21 0741   bacitracin ointment   Topical BID Harlow Asa, MD   [id number] application at [id number]   cephALEXin (KEFLEX) capsule 500 mg  500 mg Oral Q6H Tanja Gift E, MD   500 mg at 02/20/21 1205   cloBAZam (ONFI) tablet 10 mg  10 mg Oral QHS Margorie John W, PA-C   10 mg at 02/19/21 2125   feeding supplement (GLUCERNA SHAKE) (GLUCERNA SHAKE) liquid 237 mL  237 mL Oral BID BM Sharma Covert, MD   237 mL at 02/20/21 1014   hydrOXYzine (ATARAX/VISTARIL) tablet 25 mg  25 mg Oral TID PRN Chalmers Guest, NP   25 mg at 02/19/21 0914   insulin aspart (novoLOG) injection 0-9 Units  0-9 Units Subcutaneous TID WC  Viann Fish E, MD   3 Units at 02/20/21 1202   OLANZapine zydis (ZYPREXA) disintegrating tablet 5 mg  5 mg Oral Q8H PRN Harlow Asa, MD       And   LORazepam (ATIVAN) tablet 1 mg  1 mg Oral PRN Nelda Marseille, Elese Rane E, MD       magnesium hydroxide (MILK OF MAGNESIA) suspension 30 mL  30 mL Oral Daily PRN  Chalmers Guest, NP       nicotine (NICODERM CQ - dosed in mg/24 hours) patch 21 mg  21 mg Transdermal Daily Sharma Covert, MD   21 mg at 02/19/21 1100   pantoprazole (PROTONIX) EC tablet 40 mg  40 mg Oral Daily Chalmers Guest, NP   40 mg at 02/20/21 0741   QUEtiapine (SEROQUEL) tablet 400 mg  400 mg Oral QHS Briant Cedar, MD   400 mg at 02/19/21 2125    Lab Results:  Results for orders placed or performed during the hospital encounter of 02/09/21 (from the past 48 hour(s))  Glucose, capillary     Status: Abnormal   Collection Time: 02/18/21  5:11 PM  Result Value Ref Range   Glucose-Capillary 181 (H) 70 - 99 mg/dL    Comment: Glucose reference range applies only to samples taken after fasting for at least 8 hours.  Glucose, capillary     Status: Abnormal   Collection Time: 02/19/21  6:55 AM  Result Value Ref Range   Glucose-Capillary 151 (H) 70 - 99 mg/dL    Comment: Glucose reference range applies only to samples taken after fasting for at least 8 hours.  Glucose, capillary     Status: Abnormal   Collection Time: 02/19/21 11:40 AM  Result Value Ref Range   Glucose-Capillary 222 (H) 70 - 99 mg/dL    Comment: Glucose reference range applies only to samples taken after fasting for at least 8 hours.  Glucose, capillary     Status: Abnormal   Collection Time: 02/19/21  4:35 PM  Result Value Ref Range   Glucose-Capillary 183 (H) 70 - 99 mg/dL    Comment: Glucose reference range applies only to samples taken after fasting for at least 8 hours.  Glucose, capillary     Status: Abnormal   Collection Time: 02/20/21  6:32 AM  Result Value Ref Range   Glucose-Capillary 201 (H)  70 - 99 mg/dL    Comment: Glucose reference range applies only to samples taken after fasting for at least 8 hours.  Glucose, capillary     Status: Abnormal   Collection Time: 02/20/21 11:59 AM  Result Value Ref Range   Glucose-Capillary 214 (H) 70 - 99 mg/dL    Comment: Glucose reference range applies only to samples taken after fasting for at least 8 hours.    Blood Alcohol level:  Lab Results  Component Value Date   ETH <10 01/29/2021   ETH <10 [id number]    Metabolic Disorder Labs: Lab Results  Component Value Date   HGBA1C 5.8 (H) 01/30/2021   MPG 119.76 01/30/2021   MPG 283.35 09/02/2018   No results found for: PROLACTIN Lab Results  Component Value Date   CHOL 190 02/10/2021   TRIG 160 (H) 02/10/2021   HDL 26 (L) 02/10/2021   CHOLHDL 7.3 02/10/2021   VLDL 32 02/10/2021   LDLCALC 132 (H) 02/10/2021   LDLCALC 69 11/10/2015    Physical Findings:  Musculoskeletal: Strength & Muscle Tone: decreased Gait & Station: shuffle, gait belt for assistance as needed Patient leans: N/A  Psychiatric Specialty Exam:  Presentation  General Appearance: Disheveled appearing  Eye Contact:Good  Speech:Mumbling quality, normal rate  Speech Volume:Normal  Handedness:Right   Mood and Affect  Mood:Euthymic  Affect:Congruent; Appropriate   Thought Process  Thought Processes:Goal Directed; Linear but concrete  Descriptions of Associations:Intact  Orientation:Full (Time, Place and Person)  Thought Content:Denies AVH,paranoia,or delusions; is not grossly responding to internal/external stimuli  on exam  History of Schizophrenia/Schizoaffective disorder:No  Duration of Psychotic Symptoms:No data recorded Hallucinations:Hallucinations: None  Ideas of Reference:None  Suicidal Thoughts:Suicidal Thoughts: No  Homicidal Thoughts:Homicidal Thoughts: No   Sensorium  Memory:Poor [facility] 17/30  Judgment:Impaired  Insight:Shallow   Executive Functions   Concentration:Fair  Attention Span:Fair  Recall:Poor  Fund of Knowledge:Poor  Language:Fair   Psychomotor Activity  Psychomotor Activity:Psychomotor Activity: Investment banker, operational   Assets  Assets:Resilience   Sleep  Sleep:Sleep: Fair Number of Hours of Sleep: 4.5    Physical Exam: Physical Exam Vitals and nursing note reviewed.  Constitutional:      General: He is not in acute distress.    Appearance: Normal appearance. He is not ill-appearing or toxic-appearing.  HENT:     Head: Normocephalic and atraumatic.  Pulmonary:     Effort: Pulmonary effort is normal.  Musculoskeletal:       Legs:     Comments: Left buttock wound Erythematous without drainage - centra area of excoriation healing  Neurological:     General: No focal deficit present.     Mental Status: He is alert.   Review of Systems  Respiratory:  Negative for cough and shortness of breath.   Cardiovascular:  Negative for chest pain.  Gastrointestinal:  Negative for abdominal pain, constipation, diarrhea, nausea and vomiting.  Neurological:  Negative for headaches.  Blood pressure 124/73, pulse 75, temperature 97.7 F (36.5 C), temperature source Oral, resp. rate 18, height 5' 6.75" (1.695 m), weight 83.5 kg, SpO2 96 %. Body mass index is 29.03 kg/m.   Treatment Plan Summary: Daily contact with patient to assess and evaluate symptoms and progress in treatment   Juan Juarez is a 65 yr old male who presents after a suicide attempt via OD. 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     APS report made 8/18 and Social Work has faxed updated info to them.  He continues to have issues most at night which suggests possible Lutricia Horsfall.  It is questionable if patient can manage his DM regimen with SSI at discharge or if he can managed  FSBS checks. Social Work has contacted patients' mother and father and it does not appear that he would be able to live with them.  At most it appears his sister could check in on him and help take him to appointments but not provide recommended 24/7 supervision. The pressure wound on his buttocks has improved from yesterday per report, it has shrunk in overall size and is not oozing.  Will await APS findings.  Will not make medication changes at this time.  We will continue to monitor.      Unspecified bipolar and related d/o by hx (r/o mood d/o secondary to TBI, r/o substance induced mood d/o - cocaine) Mild neurocognitive disorder -Continue Seroquel 400 mg QHS -Continue Agitation Protocol- Zyprexa/Ativan -Feel patient needs 24/7 supervision based on cognitive issues and complex medical issues - APS report made   Pressure Wound to Left Buttocks: -Continue Keflex 500 mg q6 (Day 3 of 7) -Continue Bacitracin Ointment BID    Seizure Disorder: -Continue Onfi 10 mg QHS     HTN: -Continue Amlodipine 5 mg daily     Aspiration Pneumonitis(completed treatment):      Nicotine Dependence: -Continue Nicotine patch 21 mg daily     Elevated Glucose: -Continue SSI and FSBS with meals  -  If FSBS continue to trend up may need restart of oral DM medication     Elevated TSH (normalized):     -Continue Protonix 40 mg daily -Continue PRN's: Tylenol, Maalox, Atarax, Milk of Magnesia, Trazodone     Briant Cedar, MD 02/20/2021, 1:29 PM

## 2021-02-20 NOTE — Progress Notes (Signed)
1:1 Nursing note  Patient observed walking down the hall with 1:1 MHT walking beside him utilizing gait belt due to fall risk. Pt compliant with meds this am- Pt denied SI/HI and A/VH. No behavior issues noted

## 2021-02-20 NOTE — Tx Team (Signed)
Interdisciplinary Treatment and Diagnostic Plan Update  02/20/2021 Time of Session: 11:05 Talin Feister MRN: [id number]  Principal Diagnosis: Bipolar disorder, unspecified ([place])  Secondary Diagnoses: Principal Problem:   Bipolar disorder, unspecified ([place]) Active Problems:   Cocaine abuse ([place])   Seizure disorder ([place])   History of traumatic brain injury   Mild neurocognitive disorder   Current Medications:  Current Facility-Administered Medications  Medication Dose Route Frequency Provider Last Rate Last Admin   acetaminophen (TYLENOL) tablet 650 mg  650 mg Oral Q6H PRN Chalmers Guest, NP   650 mg at 02/11/21 1817   alum & mag hydroxide-simeth (MAALOX/MYLANTA) 200-200-20 MG/5ML suspension 30 mL  30 mL Oral Q4H PRN Chalmers Guest, NP       amLODipine (NORVASC) tablet 5 mg  5 mg Oral Daily Sharma Covert, MD   5 mg at 02/20/21 0741   bacitracin ointment   Topical BID Harlow Asa, MD   12.7517 application at 00/17/49 1013   cephALEXin (KEFLEX) capsule 500 mg  500 mg Oral Q6H Singleton, Amy E, MD   500 mg at 02/20/21 1205   cloBAZam (ONFI) tablet 10 mg  10 mg Oral QHS Margorie John W, PA-C   10 mg at 02/19/21 2125   feeding supplement (GLUCERNA SHAKE) (GLUCERNA SHAKE) liquid 237 mL  237 mL Oral BID BM Sharma Covert, MD   237 mL at 02/20/21 1014   hydrOXYzine (ATARAX/VISTARIL) tablet 25 mg  25 mg Oral TID PRN Chalmers Guest, NP   25 mg at 02/19/21 0914   insulin aspart (novoLOG) injection 0-9 Units  0-9 Units Subcutaneous TID WC Harlow Asa, MD   3 Units at 02/20/21 1202   OLANZapine zydis (ZYPREXA) disintegrating tablet 5 mg  5 mg Oral Q8H PRN Harlow Asa, MD       And   LORazepam (ATIVAN) tablet 1 mg  1 mg Oral PRN Harlow Asa, MD       magnesium hydroxide (MILK OF MAGNESIA) suspension 30 mL  30 mL Oral Daily PRN Chalmers Guest, NP       nicotine (NICODERM CQ - dosed in mg/24 hours) patch 21 mg  21 mg Transdermal Daily Sharma Covert, MD   21 mg at  02/19/21 1100   pantoprazole (PROTONIX) EC tablet 40 mg  40 mg Oral Daily Chalmers Guest, NP   40 mg at 02/20/21 0741   QUEtiapine (SEROQUEL) tablet 400 mg  400 mg Oral QHS Briant Cedar, MD   400 mg at 02/19/21 2125   PTA Medications: Medications Prior to Admission  Medication Sig Dispense Refill Last Dose   ALPRAZolam (XANAX) 0.5 MG tablet Take 0.5 mg by mouth at bedtime as needed.      blood glucose meter kit and supplies Dispense based on patient and insurance preference. Use up to four times daily as directed. (FOR ICD-10 E10.9, E11.9). 1 each 0    cloBAZam (ONFI) 10 MG tablet clobazam 10 mg tablet  TAKE ONE TABLET BY MOUTH EVERY DAY      doxepin (SINEQUAN) 75 MG capsule Take 75 mg by mouth.      EASY COMFORT INSULIN SYRINGE 31G X 5/16" 1 ML MISC USE AS DIRECTED ONCE DAILY FOR LEVEMIR FLEXTOUCH 100 DAY SUPPLY      gabapentin (NEURONTIN) 300 MG capsule Take by mouth.      glipiZIDE (GLUCOTROL XL) 5 MG 24 hr tablet Take 5 mg by mouth daily.  LEVEMIR 100 UNIT/ML injection Inject 0.4 mLs (40 Units total) into the skin daily. 10 mL 0    metFORMIN (GLUCOPHAGE) 500 MG tablet Take 500 mg by mouth 2 (two) times daily.      QUEtiapine (SEROQUEL) 100 MG tablet Take 1 tablet (100 mg total) by mouth 2 (two) times daily. 60 tablet 0    sertraline (ZOLOFT) 100 MG tablet Take 100 mg by mouth daily.      simvastatin (ZOCOR) 10 MG tablet Take 10 mg by mouth daily.       Patient Stressors: Marital or family conflict Medication change or noncompliance  Patient Strengths: Technical sales engineer for treatment/growth  Treatment Modalities: Medication Management, Group therapy, Case management,  1 to 1 session with clinician, Psychoeducation, Recreational therapy.   Physician Treatment Plan for Primary Diagnosis: Bipolar disorder, unspecified ([place]) Long Term Goal(s): Improvement in symptoms so as ready for discharge   Short Term Goals: Ability to identify and develop  effective coping behaviors will improve Compliance with prescribed medications will improve Ability to identify triggers associated with substance abuse/mental health issues will improve Ability to identify changes in lifestyle to reduce recurrence of condition will improve Ability to verbalize feelings will improve Ability to disclose and discuss suicidal ideas Ability to demonstrate self-control will improve  Medication Management: Evaluate patient's response, side effects, and tolerance of medication regimen.  Therapeutic Interventions: 1 to 1 sessions, Unit Group sessions and Medication administration.  Evaluation of Outcomes: Progressing  Physician Treatment Plan for Secondary Diagnosis: Principal Problem:   Bipolar disorder, unspecified ([place]) Active Problems:   Cocaine abuse ([place])   Seizure disorder ([place])   History of traumatic brain injury   Mild neurocognitive disorder  Long Term Goal(s): Improvement in symptoms so as ready for discharge   Short Term Goals: Ability to identify and develop effective coping behaviors will improve Compliance with prescribed medications will improve Ability to identify triggers associated with substance abuse/mental health issues will improve Ability to identify changes in lifestyle to reduce recurrence of condition will improve Ability to verbalize feelings will improve Ability to disclose and discuss suicidal ideas Ability to demonstrate self-control will improve     Medication Management: Evaluate patient's response, side effects, and tolerance of medication regimen.  Therapeutic Interventions: 1 to 1 sessions, Unit Group sessions and Medication administration.  Evaluation of Outcomes: Progressing   RN Treatment Plan for Primary Diagnosis: Bipolar disorder, unspecified ([place]) Long Term Goal(s): Knowledge of disease and therapeutic regimen to maintain health will improve  Short Term Goals: Ability to verbalize feelings will improve,  Ability to disclose and discuss suicidal ideas, and Ability to identify and develop effective coping behaviors will improve  Medication Management: RN will administer medications as ordered by provider, will assess and evaluate patient's response and provide education to patient for prescribed medication. RN will report any adverse and/or side effects to prescribing provider.  Therapeutic Interventions: 1 on 1 counseling sessions, Psychoeducation, Medication administration, Evaluate responses to treatment, Monitor vital signs and CBGs as ordered, Perform/monitor CIWA, COWS, AIMS and Fall Risk screenings as ordered, Perform wound care treatments as ordered.  Evaluation of Outcomes: Progressing   LCSW Treatment Plan for Primary Diagnosis: Bipolar disorder, unspecified ([place]) Long Term Goal(s): Safe transition to appropriate next level of care at discharge, Engage patient in therapeutic group addressing interpersonal concerns.  Short Term Goals: Engage patient in aftercare planning with referrals and resources, Increase ability to appropriately verbalize feelings, and Increase emotional regulation  Therapeutic Interventions: Assess for all  discharge needs, 1 to 1 time with Education officer, museum, Explore available resources and support systems, Assess for adequacy in community support network, Educate family and significant other(s) on suicide prevention, Complete Psychosocial Assessment, Interpersonal group therapy.  Evaluation of Outcomes: Progressing   Progress in Treatment: Attending groups: No. Participating in groups: No. Taking medication as prescribed: No. Toleration medication: Yes. Family/Significant other contact made: Yes, individual(s) contacted:  Mother and daughter  Patient understands diagnosis: No. Discussing patient identified problems/goals with staff: Yes. Medical problems stabilized or resolved: Yes. Denies suicidal/homicidal ideation: Yes. Issues/concerns per patient  self-inventory: No. Other: None  New problem(s) identified: No, Describe:  None  New Short Term/Long Term Goal(s):medication stabilization, elimination of SI thoughts, development of comprehensive mental wellness plan.   Patient Goals:  "get more exercise."   Discharge Plan or Barriers: CSW will continue to follow and assess for appropriate referrals and possible discharge planning.   Reason for Continuation of Hospitalization: Anxiety Depression Medication stabilization  Estimated Length of Stay: 3-5 days  Attendees: Patient:  02/20/2021   Physician:  02/20/2021   Nursing:  02/20/2021   RN Care Manager: 02/20/2021   Social Worker: Darletta Moll, LCSW 02/20/2021   Recreational Therapist:  02/20/2021   Other:  02/20/2021   Other:  02/20/2021   Other: 02/20/2021     Scribe for Treatment Team: Vassie Moselle, LCSW 02/20/2021 1:56 PM

## 2021-02-21 LAB — GLUCOSE, CAPILLARY
Glucose-Capillary: 262 mg/dL — ABNORMAL HIGH (ref 70–99)
Glucose-Capillary: 224 mg/dL — ABNORMAL HIGH (ref 70–99)
Glucose-Capillary: 232 mg/dL — ABNORMAL HIGH (ref 70–99)
Glucose-Capillary: 211 mg/dL — ABNORMAL HIGH (ref 70–99)

## 2021-02-21 NOTE — Progress Notes (Signed)
   02/20/21 2124  Psych Admission Type (Psych Patients Only)  Admission Status Involuntary  Psychosocial Assessment  Patient Complaints Irritability  Eye Contact Brief  Facial Expression Angry  Affect Irritable  Speech Logical/coherent;Argumentative  Interaction Assertive;Minimal;Guarded  Motor Activity Unsteady  Appearance/Hygiene Improved  Behavior Characteristics Unwilling to participate;Irritable  Mood Irritable  Thought Process  Coherency Circumstantial  Content WDL  Delusions None reported or observed  Perception WDL  Hallucination None reported or observed  Judgment Poor  Confusion Mild  Danger to Self  Current suicidal ideation? Denies  Danger to Others  Danger to Others None reported or observed

## 2021-02-21 NOTE — Progress Notes (Signed)
1000: Pt denies SI/HI/AVH. Pt is calm, cooperative, with 1:1 sitter all day for fall prevention.  1400: Pt continue to denies SI/HI/AVH, is calm, cooperative, with 1:1 sitter all day for fall prevention. 1800: Pt continues to deny SI/HI/AVH, is calm, cooperative, with 1:1 sitter all day for fall prevention. Will continue to monitor pt per Q15 minute face checks and monitor for safety, and continue 1:1 with sitter.

## 2021-02-21 NOTE — Progress Notes (Signed)
Inpatient Diabetes Program Recommendations  AACE/ADA: New Consensus Statement on Inpatient Glycemic Control (2015)  Target Ranges:  Prepandial:   less than 140 mg/dL      Peak postprandial:   less than 180 mg/dL (1-2 hours)      Critically ill patients:  140 - 180 mg/dL   Lab Results  Component Value Date   GLUCAP 262 (H) 02/21/2021   HGBA1C 5.8 (H) 01/30/2021   Review of Glycemic Control Results for IRWIN, RUBIO (MRN [id number]) as of 02/21/2021 11:29  Ref. Range 02/19/2021 06:55 02/19/2021 11:40 02/19/2021 16:35 02/20/2021 06:32 02/20/2021 11:59 02/21/2021 06:42  Glucose-Capillary Latest Ref Range: 70 - 99 mg/dL 151 (H) 222 (H) 183 (H) 201 (H) 214 (H) 262 (H)   Diabetes history: type 2 Outpatient Diabetes medications: Levemir 40 units daily, Metformin 500 mg BID, Glucotrol 5 mg daily Current orders for Inpatient glycemic control: Novolog SENSITIVE correction scale TID  Inpatient Diabetes Program Recommendations:   -  Add Levemir 15 units daily   Hgba1c is 5.8% which may indicate patient was having low blood sugars at home.   Thanks,  Tama Headings RN, MSN, BC-ADM Inpatient Diabetes Coordinator Team Pager [phone] (8a-5p)

## 2021-02-21 NOTE — Progress Notes (Signed)
Adult Psychoeducational Group Note  Date:  02/21/2021 Time:  10:43 PM  Group Topic/Focus:  Wrap-Up Group: The focus of this group is to help patients review their daily goal of treatment and discuss progress on daily workbooks.  Participation Level: Did Not Attend   Participation Quality: Not Applicable   Affect: Not Applicable   Cognitive: Not Applicable   Insight: None   Engagement in Group: Not Applicable   Modes of Intervention: Not Applicable   Additional Comments: Pt did not attend evening wrap up group tonight.      Trinna Post 02/21/2021, 10:43 PM

## 2021-02-21 NOTE — BHH Group Notes (Signed)
Spiritual care group on grief and loss facilitated by chaplain Janne Napoleon, [facility]   Group Goal:   Support / Education around grief and loss   Members engage in facilitated group support and psycho-social education.   Group Description:   Following introductions and group rules, group members engaged in facilitated group dialog and support around topic of loss, with particular support around experiences of loss in their lives. Group Identified types of loss (relationships / self / things) and identified patterns, circumstances, and changes that precipitate losses. Reflected on thoughts / feelings around loss, normalized grief responses, and recognized variety in grief experience. Group noted Worden's four tasks of grief in discussion.   Group drew on Adlerian / Rogerian, narrative, MI,   Patient Progress: Juan Juarez attended group.  He engaged with the group at times and at other times he was not.  He initiated several conversations with other patients during group and was redirected.  Juan Juarez, Bcc Pager, [phone] 3:12 PM

## 2021-02-21 NOTE — Progress Notes (Signed)
[facility] MD Progress Note  02/21/2021 12:35 PM Juan Juarez  MRN:  [id number] Subjective:   Mr. Juan Juarez is a 65 yr old male who presents after a suicide attempt via OD. 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   He reports that he is doing well today.  He reports that he slept well last night.  He reports that his appetite remains well.  He reports no SI, HI, or AVH.  He reports that he is wanting to be discharged because he has "things to do".  He reports no issues with his medications.  Discussed with him that we are awaiting APS report.  When asked why discussed with him that Dr. Nelda Juarez had concerns about his ability to take care of himself specifically administering medications on discharge.  He reported that he has been taking care of himself for years now and did not see the reason why he needed to have someone care for him now.  Discussed the importance of having his blood sugars checked as he had refused previous blood sugar checks.  He stated that he did have a mild headache earlier in the day but that medicine he was given for adequately controlled.  He had no other concerns at present.   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 substance abuse, Seizure Disorder, 1 previous suicide attempt (OD)  Past Medical History:  Past Medical History:  Diagnosis Date   Anxiety    Chronic elbow pain, left     Depression    Diabetes mellitus, type II ([place])    type II   Gout    Head injury 12/2017   Hepatitis C without hepatic coma    Hypertension    denies   Inflammatory arthropathy    left elbow   Insomnia    Non compliance w medication regimen    Osteoarthritis    Polysubstance abuse ([place])    Prostate cancer ([place])    Protein-calorie malnutrition, severe ([place]) 09/13/2016   Seizure ([place]) 12/2017   after head Injury   Thrombocytopenia [facility])     Past Surgical History:  Procedure Laterality Date   BURR HOLE Right 01/22/2018   Procedure: RIGHT BURR HOLES;  Surgeon: Newman Pies, MD;  Location: [place];  Service: Neurosurgery;  Laterality: Right;   I & D EXTREMITY Left 11/06/2018   Procedure: IRRIGATION AND DEBRIDEMENT LEFT ELBOW JOINT;  Surgeon: Charlotte Crumb, MD;  Location: [place];  Service: Orthopedics;  Laterality: Left;   INCISION AND DRAINAGE Left 10/24/2018   Procedure: INCISION AND DRAINAGE LEFT ELBOW WITH MASS EXCISION;  Surgeon: Charlotte Crumb, MD;  Location: [place];  Service: Orthopedics;  Laterality: Left;   MULTIPLE TOOTH EXTRACTIONS     None     Family History:  Family History  Problem Relation Age of Onset   Thyroid disease Sister    Colon cancer Neg Hx    Colon polyps Neg Hx    Breast cancer Neg Hx    Prostate cancer Neg Hx    Pancreatic cancer  Neg Hx    Family Psychiatric  History: None Reported Social History:  Social History   Substance and Sexual Activity  Alcohol Use Not Currently   Comment: quit 2019     Social History   Substance and Sexual Activity  Drug Use Not Currently   Types: Marijuana, Cocaine   Comment: cocaine last use 10/26/2018, Marijuana sping 2019    Social History   Socioeconomic History   Marital status: Divorced    Spouse name: Not on file   Number of children: Not on file   Years of education: Not on file   Highest education level: Not on file  Occupational History   Occupation: disabled  Tobacco Use   Smoking status:  Every Day    Packs/day: 0.50    Years: 50.00    Pack years: 25.00    Types: Cigarettes   Smokeless tobacco: Never  Vaping Use   Vaping Use: Never used  Substance and Sexual Activity   Alcohol use: Not Currently    Comment: quit 2019   Drug use: Not Currently    Types: Marijuana, Cocaine    Comment: cocaine last use 10/26/2018, Marijuana sping 2019   Sexual activity: Not Currently  Other Topics Concern   Not on file  Social History Narrative   Not on file   Social Determinants of Health   Financial Resource Strain: Not on file  Food Insecurity: Not on file  Transportation Needs: Not on file  Physical Activity: Not on file  Stress: Not on file  Social Connections: Not on file   Additional Social History:                         Sleep: Fair  Appetite:  Good  Current Medications: Current Facility-Administered Medications  Medication Dose Route Frequency Provider Last Rate Last Admin   acetaminophen (TYLENOL) tablet 650 mg  650 mg Oral Q6H PRN Chalmers Guest, NP   650 mg at 02/11/21 1817   alum & mag hydroxide-simeth (MAALOX/MYLANTA) 200-200-20 MG/5ML suspension 30 mL  30 mL Oral Q4H PRN Chalmers Guest, NP       amLODipine (NORVASC) tablet 5 mg  5 mg Oral Daily Sharma Covert, MD   5 mg at 02/21/21 [id number]   bacitracin ointment   Topical BID Harlow Asa, MD   [id number] application at 0000000 [phone]   cephALEXin (KEFLEX) capsule 500 mg  500 mg Oral Q6H Singleton, Amy E, MD   500 mg at 02/21/21 0902   cloBAZam (ONFI) tablet 10 mg  10 mg Oral QHS Margorie John W, PA-C   10 mg at 02/20/21 2129   feeding supplement (GLUCERNA SHAKE) (GLUCERNA SHAKE) liquid 237 mL  237 mL Oral BID BM Sharma Covert, MD   237 mL at 02/20/21 1545   hydrOXYzine (ATARAX/VISTARIL) tablet 25 mg  25 mg Oral TID PRN Chalmers Guest, NP   25 mg at 02/19/21 0914   insulin aspart (novoLOG) injection 0-9 Units  0-9 Units Subcutaneous TID WC Juan Juarez, Amy E, MD   5 Units at 02/21/21 0649    OLANZapine zydis (ZYPREXA) disintegrating tablet 5 mg  5 mg Oral Q8H PRN Harlow Asa, MD       And   LORazepam (ATIVAN) tablet 1 mg  1 mg Oral PRN Juan Juarez, Amy E, MD       magnesium hydroxide (MILK OF MAGNESIA) suspension 30 mL  30 mL Oral Daily PRN Pecolia Ades  R, NP       nicotine (NICODERM CQ - dosed in mg/24 hours) patch 21 mg  21 mg Transdermal Daily Sharma Covert, MD   21 mg at 02/19/21 1100   pantoprazole (PROTONIX) EC tablet 40 mg  40 mg Oral Daily Chalmers Guest, NP   40 mg at 02/21/21 [id number]   QUEtiapine (SEROQUEL) tablet 400 mg  400 mg Oral QHS Briant Cedar, MD   400 mg at 02/20/21 2129    Lab Results:  Results for orders placed or performed during the hospital encounter of 02/09/21 (from the past 48 hour(s))  Glucose, capillary     Status: Abnormal   Collection Time: 02/19/21  4:35 PM  Result Value Ref Range   Glucose-Capillary 183 (H) 70 - 99 mg/dL    Comment: Glucose reference range applies only to samples taken after fasting for at least 8 hours.  Glucose, capillary     Status: Abnormal   Collection Time: 02/20/21  6:32 AM  Result Value Ref Range   Glucose-Capillary 201 (H) 70 - 99 mg/dL    Comment: Glucose reference range applies only to samples taken after fasting for at least 8 hours.  Glucose, capillary     Status: Abnormal   Collection Time: 02/20/21 11:59 AM  Result Value Ref Range   Glucose-Capillary 214 (H) 70 - 99 mg/dL    Comment: Glucose reference range applies only to samples taken after fasting for at least 8 hours.  Glucose, capillary     Status: Abnormal   Collection Time: 02/21/21  6:42 AM  Result Value Ref Range   Glucose-Capillary 262 (H) 70 - 99 mg/dL    Comment: Glucose reference range applies only to samples taken after fasting for at least 8 hours.  Glucose, capillary     Status: Abnormal   Collection Time: 02/21/21 11:44 AM  Result Value Ref Range   Glucose-Capillary 224 (H) 70 - 99 mg/dL    Comment: Glucose reference range  applies only to samples taken after fasting for at least 8 hours.    Blood Alcohol level:  Lab Results  Component Value Date   ETH <10 01/29/2021   ETH <10 [id number]    Metabolic Disorder Labs: Lab Results  Component Value Date   HGBA1C 5.8 (H) 01/30/2021   MPG 119.76 01/30/2021   MPG 283.35 09/02/2018   No results found for: PROLACTIN Lab Results  Component Value Date   CHOL 190 02/10/2021   TRIG 160 (H) 02/10/2021   HDL 26 (L) 02/10/2021   CHOLHDL 7.3 02/10/2021   VLDL 32 02/10/2021   LDLCALC 132 (H) 02/10/2021   LDLCALC 69 11/10/2015    Physical Findings: AIMS: Facial and Oral Movements Muscles of Facial Expression: None, normal Lips and Perioral Area: None, normal Jaw: None, normal Tongue: None, normal,Extremity Movements Upper (arms, wrists, hands, fingers): None, normal Lower (legs, knees, ankles, toes): None, normal (Fall precaution, unsteady gait. Walks with care.), Trunk Movements Neck, shoulders, hips: None, normal, Overall Severity Severity of abnormal movements (highest score from questions above): None, normal Incapacitation due to abnormal movements: None, normal Patient's awareness of abnormal movements (rate only patient's report): No Awareness, Dental Status Current problems with teeth and/or dentures?: No Does patient usually wear dentures?: No  CIWA:  CIWA-Ar Total: 0 COWS:     Musculoskeletal: Strength & Muscle Tone: decreased Gait & Station: shuffle, with gait belt Patient leans: N/A  Psychiatric Specialty Exam:  Presentation  General Appearance: Disheveled  Eye Contact:Good  Speech:Normal Rate (mix of clear and coherent with garbled)  Speech Volume:Normal  Handedness:Right   Mood and Affect  Mood:Euthymic  Affect:Appropriate; Congruent   Thought Process  Thought Processes:Goal Directed; Linear  Descriptions of Associations:Intact  Orientation:Full (Time, Place and Person)  Thought Content:Logical; WDL  History of  Schizophrenia/Schizoaffective disorder:No  Duration of Psychotic Symptoms:No data recorded Hallucinations:Hallucinations: None  Ideas of Reference:None  Suicidal Thoughts:Suicidal Thoughts: No  Homicidal Thoughts:Homicidal Thoughts: No   Sensorium  Memory:Immediate Fair; Recent Fair  Judgment:Impaired  Insight:Shallow   Executive Functions  Concentration:Fair  Attention Span:Good  Recall:Poor  Fund of Knowledge:Poor  Language:Good   Psychomotor Activity  Psychomotor Activity:Psychomotor Activity: Shuffling Gait   Assets  Assets:Resilience   Sleep  Sleep:Sleep: Fair Number of Hours of Sleep: 5    Physical Exam: Physical Exam Vitals and nursing note reviewed.  Constitutional:      General: He is not in acute distress.    Appearance: Normal appearance. He is normal weight. He is not ill-appearing or toxic-appearing.  HENT:     Head: Normocephalic.  Pulmonary:     Effort: Pulmonary effort is normal.  Musculoskeletal:        General: Normal range of motion.       Legs:     Comments: Erythematous, nondraining  Neurological:     General: No focal deficit present.     Mental Status: He is alert.   Review of Systems  Respiratory:  Negative for cough and shortness of breath.   Cardiovascular:  Negative for chest pain.  Gastrointestinal:  Negative for abdominal pain, constipation, diarrhea, nausea and vomiting.  Neurological:  Positive for headaches (mild resolved). Negative for weakness.  Blood pressure 123/71, pulse 74, temperature 97.6 F (36.4 C), temperature source Oral, resp. rate 18, height 5' 6.75" (1.695 m), weight 83.5 kg, SpO2 96 %. Body mass index is 29.03 kg/m.   Treatment Plan Summary: Daily contact with patient to assess and evaluate symptoms and progress in treatment   Mr. Waychoff is a 65 yr old male who presents after a suicide attempt via OD. 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   Awaiting final APS report.  The pressure ulcer on his left buttocks is stable, it is still not draining.  Bandage had fallen off had nursing apply fresh bandage with bacitracin ointment.  We will continue to monitor to ensure that it does not develop into a more serious issue.  Will not make any medication changes at this time.  We will continue to monitor.   Unspecified bipolar and related d/o by hx (r/o mood d/o secondary to TBI, r/o substance induced mood d/o - cocaine) Mild neurocognitive disorder -Continue Seroquel 400 mg QHS -Continue Agitation Protocol- Zyprexa/Ativan -Feel patient needs 24/7 supervision based on cognitive issues and complex medical issues - APS report made     Pressure Wound to Left Buttocks: -Continue Keflex 500 mg q6 (Day 4 of 7) -Continue Bacitracin Ointment BID    Seizure Disorder: -Continue Onfi 10 mg QHS     HTN: -Continue Amlodipine 5 mg daily     Aspiration Pneumonitis(completed treatment):      Nicotine Dependence: -Continue Nicotine patch 21 mg daily     Elevated Glucose: -Continue SSI and FSBS with meals  - If FSBS continue to trend up may need restart of oral DM medication     Elevated  TSH (normalized):     -Continue Protonix 40 mg daily -Continue PRN's: Tylenol, Maalox, Atarax, Milk of Magnesia, Trazodone     Briant Cedar, MD 02/21/2021, 12:35 PM

## 2021-02-21 NOTE — Progress Notes (Signed)
D: Patient became increasingly irritable during assessment. Patient reports he is tired of being told what to do and would like to do as he pleases. Patient denies SI/HI at this time. Patient also denies AH/VH at this time.  A: Provided positive reinforcement and encouragement. 1:1 observation in place for safety. R: Patient cooperative and receptive to efforts. Patient remains safe on the unit under 1:1 observation.

## 2021-02-22 LAB — GLUCOSE, CAPILLARY
Glucose-Capillary: 286 mg/dL — ABNORMAL HIGH (ref 70–99)
Glucose-Capillary: 235 mg/dL — ABNORMAL HIGH (ref 70–99)
Glucose-Capillary: 282 mg/dL — ABNORMAL HIGH (ref 70–99)

## 2021-02-22 NOTE — Progress Notes (Signed)
1:1 notes  Pt encouraged to go to LCSW group.  Pt in no acute distress.  1:1 sitter with pt.  Pt remains safe.

## 2021-02-22 NOTE — BHH Group Notes (Signed)
Type of Therapy and Topic:  Group Therapy - Healthy vs Unhealthy Coping Skills  Participation Level:  Active   Description of Group The focus of this group was to determine what unhealthy coping techniques typically are used by group members and what healthy coping techniques would be helpful in coping with various problems. Patients were guided in becoming aware of the differences between healthy and unhealthy coping techniques. Patients were asked to identify 2-3 healthy coping skills they would like to learn to use more effectively.  Therapeutic Goals 1. Patients learned that coping is what human beings do all day long to deal with various situations in their lives 2. Patients defined and discussed healthy vs unhealthy coping techniques 3. Patients identified their preferred coping techniques and identified whether these were healthy or unhealthy 4. Patients determined 2-3 healthy coping skills they would like to become more familiar with and use more often. 5. Patients provided support and ideas to each other   Summary of Patient Progress:  Pt was provided with a worksheet to complete and share with the group.  Pt accepted this worksheet and was appropriate.  Pt was encouraged to speak with the social worker if they had any questions or concerns.

## 2021-02-22 NOTE — Progress Notes (Signed)
[facility] MD Progress Note  02/22/2021 3:37 PM Juan Juarez  MRN:  [id number] Subjective:   Mr. Juan Juarez is a 65 yr old male who presents after a suicide attempt via OD. 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   He reports that he is doing good today.  He reports that he slept well last night.  He reports that his appetite is doing good.  He reports no SI, HI, or AVH.  He reports no issues with his medications.  He asks when he will be discharged.  Discussed that we are awaiting the final report from APS and for his pressure wound to heal.  Discussed with him the importance of staying active as possible on the unit and not resting/sitting on his bottom as much as he is able to.  He reported understanding.  He has no other concerns at present.   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 and substance abuse, Seizure Disorder, 1 previous suicide attempt (OD)  Past Medical History:  Past Medical History:  Diagnosis Date   Anxiety    Chronic elbow pain, left    Depression    Diabetes mellitus, type II ([place])    type II   Gout    Head injury 12/2017   Hepatitis C without hepatic coma    Hypertension    denies   Inflammatory arthropathy    left elbow   Insomnia    Non compliance w medication regimen    Osteoarthritis    Polysubstance abuse ([place])    Prostate cancer ([place])    Protein-calorie malnutrition, severe  ([place]) 09/13/2016   Seizure ([place]) 12/2017   after head Injury   Thrombocytopenia [facility])     Past Surgical History:  Procedure Laterality Date   BURR HOLE Right 01/22/2018   Procedure: RIGHT BURR HOLES;  Surgeon: Newman Pies, MD;  Location: [place];  Service: Neurosurgery;  Laterality: Right;   I & D EXTREMITY Left 11/06/2018   Procedure: IRRIGATION AND DEBRIDEMENT LEFT ELBOW JOINT;  Surgeon: Charlotte Crumb, MD;  Location: [place];  Service: Orthopedics;  Laterality: Left;   INCISION AND DRAINAGE Left 10/24/2018   Procedure: INCISION AND DRAINAGE LEFT ELBOW WITH MASS EXCISION;  Surgeon: Charlotte Crumb, MD;  Location: [place];  Service: Orthopedics;  Laterality: Left;   MULTIPLE TOOTH EXTRACTIONS     None     Family History:  Family History  Problem Relation Age of Onset   Thyroid disease Sister    Colon cancer Neg Hx    Colon polyps Neg Hx    Breast cancer Neg Hx    Prostate cancer Neg Hx    Pancreatic cancer Neg Hx    Family Psychiatric  History:  None Reported Social History:  Social History   Substance and Sexual Activity  Alcohol Use Not Currently   Comment: quit 2019     Social History   Substance and Sexual Activity  Drug Use Not Currently   Types: Marijuana, Cocaine   Comment: cocaine last use 10/26/2018, Marijuana sping  2019    Social History   Socioeconomic History   Marital status: Divorced    Spouse name: Not on file   Number of children: Not on file   Years of education: Not on file   Highest education level: Not on file  Occupational History   Occupation: disabled  Tobacco Use   Smoking status: Every Day    Packs/day: 0.50    Years: 50.00    Pack years: 25.00    Types: Cigarettes   Smokeless tobacco: Never  Vaping Use   Vaping Use: Never used  Substance and Sexual Activity   Alcohol use: Not Currently    Comment: quit 2019   Drug use: Not Currently    Types: Marijuana, Cocaine    Comment: cocaine last use 10/26/2018, Marijuana sping 2019    Sexual activity: Not Currently  Other Topics Concern   Not on file  Social History Narrative   Not on file   Social Determinants of Health   Financial Resource Strain: Not on file  Food Insecurity: Not on file  Transportation Needs: Not on file  Physical Activity: Not on file  Stress: Not on file  Social Connections: Not on file   Additional Social History:                         Sleep: Good  Appetite:  Good  Current Medications: Current Facility-Administered Medications  Medication Dose Route Frequency Provider Last Rate Last Admin   acetaminophen (TYLENOL) tablet 650 mg  650 mg Oral Q6H PRN Chalmers Guest, NP   650 mg at 02/11/21 1817   alum & mag hydroxide-simeth (MAALOX/MYLANTA) 200-200-20 MG/5ML suspension 30 mL  30 mL Oral Q4H PRN Chalmers Guest, NP       amLODipine (NORVASC) tablet 5 mg  5 mg Oral Daily Sharma Covert, MD   5 mg at 02/22/21 [id number]   bacitracin ointment   Topical BID Harlow Asa, MD   [id number] application at [id number]   cephALEXin (KEFLEX) capsule 500 mg  500 mg Oral Q6H Singleton, Amy E, MD   500 mg at 02/22/21 1120   cloBAZam (ONFI) tablet 10 mg  10 mg Oral QHS Margorie John W, PA-C   10 mg at 02/21/21 2102   feeding supplement (GLUCERNA SHAKE) (GLUCERNA SHAKE) liquid 237 mL  237 mL Oral BID BM Sharma Covert, MD   237 mL at 02/22/21 1418   hydrOXYzine (ATARAX/VISTARIL) tablet 25 mg  25 mg Oral TID PRN Chalmers Guest, NP   25 mg at 02/21/21 2102   insulin aspart (novoLOG) injection 0-9 Units  0-9 Units Subcutaneous TID WC Harlow Asa, MD   3 Units at 02/22/21 1118   OLANZapine zydis (ZYPREXA) disintegrating tablet 5 mg  5 mg Oral Q8H PRN Harlow Asa, MD       And   LORazepam (ATIVAN) tablet 1 mg  1 mg Oral PRN Harlow Asa, MD       magnesium hydroxide (MILK OF MAGNESIA) suspension 30 mL  30 mL Oral Daily PRN Chalmers Guest, NP       nicotine (NICODERM CQ - dosed in mg/24 hours) patch 21 mg  21 mg Transdermal Daily  Sharma Covert, MD   21 mg at 02/19/21 1100   pantoprazole (PROTONIX) EC tablet 40 mg  40 mg Oral Daily Chalmers Guest, NP   40 mg at 02/22/21 0824   QUEtiapine (SEROQUEL)  tablet 400 mg  400 mg Oral QHS Briant Cedar, MD   400 mg at 02/21/21 2102    Lab Results:  Results for orders placed or performed during the hospital encounter of 02/09/21 (from the past 48 hour(s))  Glucose, capillary     Status: Abnormal   Collection Time: 02/21/21  6:42 AM  Result Value Ref Range   Glucose-Capillary 262 (H) 70 - 99 mg/dL    Comment: Glucose reference range applies only to samples taken after fasting for at least 8 hours.  Glucose, capillary     Status: Abnormal   Collection Time: 02/21/21 11:44 AM  Result Value Ref Range   Glucose-Capillary 224 (H) 70 - 99 mg/dL    Comment: Glucose reference range applies only to samples taken after fasting for at least 8 hours.  Glucose, capillary     Status: Abnormal   Collection Time: 02/21/21  4:59 PM  Result Value Ref Range   Glucose-Capillary 232 (H) 70 - 99 mg/dL    Comment: Glucose reference range applies only to samples taken after fasting for at least 8 hours.  Glucose, capillary     Status: Abnormal   Collection Time: 02/21/21  8:41 PM  Result Value Ref Range   Glucose-Capillary 211 (H) 70 - 99 mg/dL    Comment: Glucose reference range applies only to samples taken after fasting for at least 8 hours.  Glucose, capillary     Status: Abnormal   Collection Time: 02/22/21  5:56 AM  Result Value Ref Range   Glucose-Capillary 286 (H) 70 - 99 mg/dL    Comment: Glucose reference range applies only to samples taken after fasting for at least 8 hours.  Glucose, capillary     Status: Abnormal   Collection Time: 02/22/21 11:15 AM  Result Value Ref Range   Glucose-Capillary 235 (H) 70 - 99 mg/dL    Comment: Glucose reference range applies only to samples taken after fasting for at least 8 hours.    Blood Alcohol level:  Lab Results  Component  Value Date   ETH <10 01/29/2021   ETH <10 [id number]    Metabolic Disorder Labs: Lab Results  Component Value Date   HGBA1C 5.8 (H) 01/30/2021   MPG 119.76 01/30/2021   MPG 283.35 09/02/2018   No results found for: PROLACTIN Lab Results  Component Value Date   CHOL 190 02/10/2021   TRIG 160 (H) 02/10/2021   HDL 26 (L) 02/10/2021   CHOLHDL 7.3 02/10/2021   VLDL 32 02/10/2021   LDLCALC 132 (H) 02/10/2021   LDLCALC 69 11/10/2015    Physical Findings:   Musculoskeletal: Strength & Muscle Tone: decreased Gait & Station: shuffle, with gait belt Patient leans: N/A  Psychiatric Specialty Exam:  Presentation  General Appearance: Disheveled; Casual  Eye Contact:Good  Speech:Normal Rate (mostly clear and coherent with some garbled words)  Speech Volume:Normal  Handedness:Right   Mood and Affect  Mood:Euthymic  Affect:Appropriate; Congruent   Thought Process  Thought Processes:Coherent; Goal Directed  Descriptions of Associations:Intact  Orientation:Full (Time, Place and Person)  Thought Content:Logical; WDL  History of Schizophrenia/Schizoaffective disorder:No  Duration of Psychotic Symptoms:No data recorded Hallucinations:Hallucinations: None  Ideas of Reference:None  Suicidal Thoughts:Suicidal Thoughts: No  Homicidal Thoughts:Homicidal Thoughts: No   Sensorium  Memory:Immediate Fair; Recent Fair  Judgment:Poor  Insight:Poor   Executive Functions  Concentration:Fair  Attention Span:Good  [place]  Language:Good   Psychomotor Activity  Psychomotor Activity:Psychomotor Activity: Hotel manager  Assets:Resilience   Sleep  Sleep:Sleep: Fair Number of Hours of Sleep: 5.75    Physical Exam: Physical Exam Vitals and nursing note reviewed.  Constitutional:      General: He is not in acute distress.    Appearance: Normal appearance. He is obese. He is not ill-appearing or toxic-appearing.   HENT:     Head: Normocephalic and atraumatic.  Pulmonary:     Effort: Pulmonary effort is normal.  Musculoskeletal:        General: Normal range of motion.       Legs:     Comments: Erythematous, nondraining  Neurological:     General: No focal deficit present.     Mental Status: He is alert.   Review of Systems  Respiratory:  Negative for cough and shortness of breath.   Cardiovascular:  Negative for chest pain.  Gastrointestinal:  Negative for abdominal pain, constipation, diarrhea, nausea and vomiting.  Neurological:  Negative for weakness and headaches.  Psychiatric/Behavioral:  Negative for depression, hallucinations and suicidal ideas. The patient is not nervous/anxious.   Blood pressure 126/81, pulse 75, temperature 98.1 F (36.7 C), temperature source Oral, resp. rate 18, height 5' 6.75" (1.695 m), weight 83.5 kg, SpO2 98 %. Body mass index is 29.03 kg/m.   Treatment Plan Summary: Daily contact with patient to assess and evaluate symptoms and progress in treatment   Mr. Hetland is a 65 yr old male who presents after a suicide attempt via OD. 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     He does continue to to clear somewhat on a day-to-day basis.  Social work called APS and they reported that the first interview he did he was deemed competent but that they would like a second opinion.  So APS will come out to interview him again tomorrow to determine competency.  If he is deemed competent then they will help him arrange as many outpatient/community resources as possible to help him.  The plan would then be to discharge him relatively soon after that most likely the end of the week or this weekend.  We will not make any medication changes at this time.  We will continue to monitor.     Unspecified  bipolar and related d/o by hx (r/o mood d/o secondary to TBI, r/o substance induced mood d/o - cocaine) Mild neurocognitive disorder -Continue Seroquel 400 mg QHS -Continue Agitation Protocol- Zyprexa/Ativan -APS will reexamine tomorrow     Pressure Wound to Left Buttocks: -Continue Keflex 500 mg q6 (Day 5 of 7) -Continue Bacitracin Ointment BID    Seizure Disorder: -Continue Onfi 10 mg QHS     HTN: -Continue Amlodipine 5 mg daily     Aspiration Pneumonitis(completed treatment):      Nicotine Dependence: -Continue Nicotine patch 21 mg daily     Elevated Glucose: -Continue SSI and FSBS with meals  - If FSBS continue to trend up may need restart of oral DM medication     Elevated TSH (normalized):     -Continue Protonix 40 mg daily -Continue PRN's: Tylenol, Maalox, Atarax, Milk of Magnesia, Trazodone     Briant Cedar, MD 02/22/2021, 3:37 PM

## 2021-02-22 NOTE — Progress Notes (Signed)
Pt presents with anxiety and uses profanity when talking to RN.  Pt says, "stop treating me like a damn child."  1:1 sitter explained to pt that this writer was trying to assist pt walk in the hallway to prevent pt from falling.  Pt took medications without issue.  Pt remains safe on the unit.

## 2021-02-22 NOTE — Progress Notes (Signed)
1:1 Note Pt is in his room lying on bed resting, sitter by the bed side. No distress noted, will continue to monitor.

## 2021-02-22 NOTE — Progress Notes (Signed)
Pt has been sleeping well, but work up around 0100 am asking for his car keys. Pt appeared disoriented, but when asked if he knew where he was, pt stated that he is in the hospital. Pt also asking for cigarette, pt said the nicotine patch is not helping. Pt and the sitter walked on the hallway for a while then went back to his room and went to bed. Pt is lying down in his bed at this time, will continue to monitor.

## 2021-02-22 NOTE — Progress Notes (Signed)
Adult Psychoeducational Group Note  Date:  02/22/2021 Time:  10:44 PM  Group Topic/Focus:  Wrap-Up Group:   The focus of this group is to help patients review their daily goal of treatment and discuss progress on daily workbooks.  Participation Level:  Did Not Attend  Participation Quality:  Not Applicable  Affect:  Not Applicable  Cognitive:  Not Applicable  Insight: None  Engagement in Group:  Not Applicable  Modes of Intervention:  Not Applicable  Additional Comments:    Trinna Post 02/22/2021, 10:44 PM

## 2021-02-22 NOTE — Progress Notes (Signed)
CSW spoke with [facility] regarding patient status with APS.  Jennine reports that patient seems to be cognitively not appropriate to take on guardianship but she was going to have another Education officer, museum to do an assessment with patient to verify her assessment.  Jennine reports that other clinician should be coming on Thursday to complete her evaluation.    If patient is deemed competent, APS worker discussed having patient sign a form to continue to help him with supportive services but that he would be able to be his own decision maker about assisted living facility.  CSW notified patient physician of outcome of current APS status.    Meer Reindl, LCSW, [place] Social Worker  [facility]

## 2021-02-22 NOTE — Progress Notes (Signed)
Recreation Therapy Notes  Date:  8.24.22 Time: 0930 Location: [address] Dayroom  Group Topic: Stress Management  Goal Area(s) Addresses:  Patient will identify positive stress management techniques. Patient will identify benefits of using stress management post d/c.  Intervention: Stress Management  Activity :  Meditation.  LRT played a meditation that focused on being resilient in the face of adversity and managing reactions to situations beyond your control.  Education:  Stress Management, Discharge Planning.   Education Outcome: Acknowledges Education  Clinical Observations/Feedback:  Pt did not attend group session.    Victorino Sparrow, LRT/CTRS         Victorino Sparrow A 02/22/2021 12:23 PM

## 2021-02-22 NOTE — B[facility] Group Notes (Signed)
Pt did not attend goals group. 

## 2021-02-22 NOTE — Progress Notes (Addendum)
1:1 note:  Pt is in no acute distress.  1:1 sitter at pt's bedside.  Pt remains safe on the unit.

## 2021-02-22 NOTE — Progress Notes (Signed)
1:1 Note  Pt took his evening medications without problems, pt also ate his snacks before retiring to bed. Pt has been sleeping since then with even and unlabored respirations. Remains on 1;1 obs for safety, will continue to monitor.

## 2021-02-22 NOTE — Progress Notes (Signed)
1:1 Note Pt is rest in bed with eyes closed, respiration are even and unlabored. No falls witnessed or reported at [facility] time. Pt remains on 1:1 obs for safety, will continue to monitor.

## 2021-02-22 NOTE — Progress Notes (Signed)
Pt requires 1:1 monitoring as pt is a high fall risk, and he has sundowning behaviors in the evening where pt becomes confused and irritable.  Pt has been alert and oriented this shift.  Pt denies SI/HI/AVH.  RN administered medications and assessed for needs and concerns. Pt has been up in the dayroom and interacting with others.  RN will continue to monitor and provide support as needed.  02/22/21 0800  Psych Admission Type (Psych Patients Only)  Admission Status Involuntary  Psychosocial Assessment  Patient Complaints None  Eye Contact Brief  Facial Expression Flat  Affect Anxious  Speech Logical/coherent  Interaction Assertive;Guarded  Motor Activity Unsteady  Appearance/Hygiene Improved  Behavior Characteristics Cooperative  Mood Pleasant  Thought Process  Coherency WDL  Content WDL  Delusions None reported or observed  Perception WDL  Hallucination None reported or observed  Judgment Impaired  Confusion Mild  Danger to Self  Current suicidal ideation? Denies  Danger to Others  Danger to Others None reported or observed

## 2021-02-22 NOTE — Progress Notes (Signed)
Inpatient Diabetes Program Recommendations  AACE/ADA: New Consensus Statement on Inpatient Glycemic Control (2015)  Target Ranges:  Prepandial:   less than 140 mg/dL      Peak postprandial:   less than 180 mg/dL (1-2 hours)      Critically ill patients:  140 - 180 mg/dL   Lab Results  Component Value Date   GLUCAP 286 (H) 02/22/2021   HGBA1C 5.8 (H) 01/30/2021   Review of Glycemic Control Results for JEROL, ELZA (MRN [id number]) as of 02/21/2021 11:29  Ref. Range 02/19/2021 06:55 02/19/2021 11:40 02/19/2021 16:35 02/20/2021 06:32 02/20/2021 11:59 02/21/2021 06:42  Glucose-Capillary Latest Ref Range: 70 - 99 mg/dL 151 (H) 222 (H) 183 (H) 201 (H) 214 (H) 262 (H)  Results for TABITHA, BALLENGEE (MRN [id number]) as of 02/22/2021 09:05  Ref. Range 02/21/2021 06:42 02/21/2021 11:44 02/21/2021 16:59 02/21/2021 20:41 02/22/2021 05:56  Glucose-Capillary Latest Ref Range: 70 - 99 mg/dL 262 (H) 224 (H) 232 (H) 211 (H) 286 (H)   Diabetes history: type 2 Outpatient Diabetes medications: Levemir 40 units daily, Metformin 500 mg BID, Glucotrol 5 mg daily Current orders for Inpatient glycemic control: Novolog SENSITIVE correction scale TID  Inpatient Diabetes Program Recommendations:   -  Add Levemir 15 units daily   Hgba1c is 5.8% which may indicate patient was having low blood sugars at home.   Thanks,  Tama Headings RN, MSN, BC-ADM Inpatient Diabetes Coordinator Team Pager [phone] (8a-5p)

## 2021-02-23 LAB — GLUCOSE, CAPILLARY
Glucose-Capillary: 227 mg/dL — ABNORMAL HIGH (ref 70–99)
Glucose-Capillary: 233 mg/dL — ABNORMAL HIGH (ref 70–99)
Glucose-Capillary: 227 mg/dL — ABNORMAL HIGH (ref 70–99)

## 2021-02-23 NOTE — Plan of Care (Signed)
  Problem: Coping: Goal: Ability to demonstrate self-control will improve Outcome: Progressing   Problem: Health Behavior/Discharge Planning: Goal: Identification of resources available to assist in meeting health care needs will improve Outcome: Progressing   Problem: Physical Regulation: Goal: Ability to maintain clinical measurements within normal limits will improve Outcome: Progressing

## 2021-02-23 NOTE — Progress Notes (Signed)
1:1 note  Pt awoke and came to the medication room for past due meds. Pt's vitals and cbg obtained. Pt provided medication. Pt declined care for their wound. Pt was educated with no evidence of learning. Pt provided lunch tray. Pt in room now. Pt safe on the unit. Q28msafety checks implemented and continued. 1:1 continues for safety.

## 2021-02-23 NOTE — Progress Notes (Signed)
Pt did not attend evening group.

## 2021-02-23 NOTE — Progress Notes (Signed)
Nursing 1:1 note D:Pt observed laying in bed talking to himself. RR even and unlabored. A: 1:1 observation continues for safety  R: Pt remains safe

## 2021-02-23 NOTE — Progress Notes (Signed)
1:1 note  Pt has been more reclusive to their room. Pt did see PT and they felt the pt was progressing. Pt is still preoccupied with leaving. Pt provided meal tray and coverage. Pt still refusing wound care. Pt safe on the unit. Q52msafety checks implemented and continued. Will continue to monitor.

## 2021-02-23 NOTE — Progress Notes (Signed)
   02/23/21 2000  Psych Admission Type (Psych Patients Only)  Admission Status Involuntary  Psychosocial Assessment  Patient Complaints None  Eye Contact Brief  Facial Expression Anxious;Pensive  Affect Anxious  Speech Logical/coherent  Interaction Assertive  Motor Activity Slow;Unsteady  Appearance/Hygiene Unremarkable  Behavior Characteristics Cooperative;Anxious;Irritable;Resistant to care  Mood Anxious;Labile;Irritable;Pleasant  Thought Pension scheme manager thinking;Tangential  Content Ambivalence  Delusions None reported or observed  Perception WDL  Hallucination None reported or observed  Judgment Poor  Confusion Mild  Danger to Self  Current suicidal ideation? Denies  Danger to Others  Danger to Others None reported or observed   Pt seen at nurse's station with sitter. "I'm going home tomorrow. What time they gonna let me go?" Pt denies SI, HI, AVH and pain. Pt jumps from one topic to the next. Pt refused ointment for his skin tear on his buttock. "It doesn't even hurt." Pt walks away while still talking.

## 2021-02-23 NOTE — Progress Notes (Signed)
Pt didn't attend group. 

## 2021-02-23 NOTE — Progress Notes (Signed)
   D:  Pt asleep in bed lying on left side.  Pt shows no signs of distress.  A:  Pt remains on 1:1 for safety.  R:  Pt is safe on unit with Q 15 minute safety checks.

## 2021-02-23 NOTE — Progress Notes (Signed)
   D: Pt is in bathroom refusing to use the gait belt and refusing entry to the sitter.  Writer enters the room and re-iterates the provider's orders, but pt continue to refuse.  Writer redirects pt back to bed and request he not get up without my assistance.  Pt states, "he'll get up again if he wants to.'  Writer provides sitter with a phone to contact the front desk should pt get up again. A:  Pt remains on 1:1 observation for safety from falls. R:  Pt remains safe on unit with Q 15 minute safety checks.

## 2021-02-23 NOTE — Progress Notes (Signed)
   Writer is called to pt's room for assistance.  Pt is up and is now in the bathroom without gait belt.  Pt has dragged blanket into the bathroom and sheet.  Pt is sitting on toilet.  Pt informed by Probation officer that I have anxiety medicine for him.  Pt refuses and states, "I'm taking a shower." Pt is advised that the time to shower is not for a couple of hours.  Pt refuses to comply and proceeds into the shower.  At this time, writer, CN, and 2 MHTs are now in the room.  Pt is assisted out of the shower.  Pt is angry and pushes his way through those assisting to ensure pt does not fall.  Pt is cursing, while pacing through the room.  Pt eventually settles down.  Pt now laying in the bed with eyes shut.  Writer remains with sitter until pt is completely calmed down.  Writer exits the room.  Pt remains safe with 1:1 observation and Q 15 minute safety checks.

## 2021-02-23 NOTE — Progress Notes (Signed)
      02/22/21 2129  Psych Admission Type (Psych Patients Only)  Admission Status Involuntary  Psychosocial Assessment  Patient Complaints None  Eye Contact Brief  Facial Expression Flat  Affect Anxious  Speech Logical/coherent  Interaction Assertive;Guarded  Motor Activity Unsteady  Appearance/Hygiene Improved  Behavior Characteristics Resistant to care (Pt refused replacement of bandage and ointment on left buttock; Pt refused to wear gait belt while ambulating)  Mood Irritable;Anxious  Thought Process  Coherency WDL  Content WDL  Delusions None reported or observed  Perception WDL  Hallucination None reported or observed  Judgment Impaired  Confusion Mild  Danger to Self  Current suicidal ideation? Denies  Danger to Others  Danger to Others None reported or observed

## 2021-02-23 NOTE — Plan of Care (Signed)
  Problem: Health Behavior/Discharge Planning: Goal: Compliance with treatment plan for underlying cause of condition will improve Outcome: Progressing   Problem: Activity: Goal: Sleeping patterns will improve Outcome: Not Progressing   Problem: Coping: Goal: Ability to verbalize frustrations and anger appropriately will improve Outcome: Not Progressing

## 2021-02-23 NOTE — Progress Notes (Signed)
APS social worker, Colletta Maryland, came to complete an evaluation with patient.  APS worker determined patient was not appropriate to have a guardian or their was nothing substantial to determine he is cognitively unable to make decisions.  APS worker discussed that patient will be okay to return to the hotel. Colletta Maryland reported that [place] worker will meet with him at his motel at a later date to go over additional services that may benefit patient.     Margi Edmundson, LCSW, [place] Social Worker  [facility]

## 2021-02-23 NOTE — Progress Notes (Signed)
Pt stood up from the bed and grabbed WOW in pt room and attempted to break mouse on the [place]. 2 staff members attempted to grab mouse from pt while pt fought writer trying to break the mouse. Once mouse was successfully retrieved by staff, pt went towards the door and began banging on the door and kicking their feet on the wall attempting to wake up other pt's. Pt had to be escorted to the bed while attempting to fight staff.

## 2021-02-23 NOTE — Progress Notes (Addendum)
Physical Therapy Treatment Patient Details Name: Juan Juarez MRN: [id number] DOB: 06-Jun-1956 Today's Date: 02/23/2021    History of Present Illness Patient is a 65 y.o. 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 Patient presented to [facility] ED on 01/29/2021.  The patient was found unresponsive in a local hotel room.  He was brought to the emergency department by emergency medical services.  The patient had apparently made comments earlier of wanting to kill himself. Patient was intubated on 7/31 and extubated on 8/2. Patient transfered to [facility] on 8/11.    PT Comments    Pt continues to progress mobility/functional strength. Pt remains mildly unsteady with gait and requires supervision for safety but maintains balance with no device. HEP provided this session and exercises targeting LE strength and balance reviewed. Pt is at safe level to mobilize with RN staff and no further needs from skilled acute PT needed. HHPT may benefit pt to further progress mobility and address balance impairments.    Follow Up Recommendations  Home health PT     Equipment Recommendations  None recommended by PT (TBA)    Recommendations for Other Services       Precautions / Restrictions Precautions Precautions: Fall Precaution Comments: 1 fall at [facility] morning of 8/12 Restrictions Weight Bearing Restrictions: No    Mobility  Bed Mobility                    Transfers Overall transfer level: Needs assistance Equipment used: None Transfers: Sit to/from Stand Sit to Stand: Supervision;Modified independent (Device/Increase time)         General transfer comment: supervision for safety, pt at mod I  level  Ambulation/Gait Ambulation/Gait assistance: Supervision Gait Distance (Feet): 80 Feet Assistive device: None Gait Pattern/deviations: Step-through pattern;Decreased stride length Gait velocity: fair   General Gait Details: superivison for safety, no overt LOB. pt carrying coffee while ambulating and did not spill despite mild occasional sway.   Stairs             Wheelchair Mobility    Modified Rankin (Stroke Patients Only)       Balance Overall balance assessment: Needs assistance Sitting-balance support: No upper extremity supported;Feet supported Sitting balance-Leahy Scale: Good     Standing balance support: No upper extremity supported;During functional activity;Bilateral upper extremity supported Standing balance-Leahy Scale: Good   Single Leg Stance - Right Leg: 6 Single Leg Stance - Left Leg: 4 Tandem Stance - Right Leg: 14 Tandem Stance - Left Leg: 10                    Cognition Arousal/Alertness: Awake/alert Behavior During Therapy: WFL for tasks assessed/performed Overall Cognitive Status: No family/caregiver present to determine baseline cognitive functioning Area of Impairment: Attention;Following commands;Safety/judgement;Awareness;Problem solving;Memory;Orientation                   Current Attention Level: Selective Memory: Decreased short-term memory Following Commands: Follows multi-step commands inconsistently;Follows multi-step commands with increased time;Follows one step commands consistently Safety/Judgement: Decreased awareness of safety;Decreased awareness of deficits Awareness: Emergent Problem Solving: Difficulty sequencing;Requires verbal cues;Slow processing        Exercises General Exercises - Lower Extremity Heel Raises: AROM;Strengthening;Both;Standing;15 reps;Limitations Heel Raises Limitations: 1x 15 reps bil hand  support on counter/shelf Other Exercises Other Exercises: 2x 10 reps Sit<>Stand no UE  use for power up Other Exercises: 10 reps mini-squat at wall with 3-5 second holds.    General Comments        Pertinent Vitals/Pain Pain Assessment: No/denies pain    Home Living                      Prior Function            PT Goals (current goals can now be found in the care plan section) Acute Rehab PT Goals Patient Stated Goal: Go home. PT Goal Formulation: With patient Time For Goal Achievement: 02/24/21 Potential to Achieve Goals: Fair Progress towards PT goals: Progressing toward goals    Frequency    Min 3X/week      PT Plan Current plan remains appropriate    Co-evaluation              AM-PAC PT "6 Clicks" Mobility   Outcome Measure  Help needed turning from your back to your side while in a flat bed without using bedrails?: None Help needed moving from lying on your back to sitting on the side of a flat bed without using bedrails?: None Help needed moving to and from a bed to a chair (including a wheelchair)?: A Little Help needed standing up from a chair using your arms (e.g., wheelchair or bedside chair)?: A Little Help needed to walk in hospital room?: A Little Help needed climbing 3-5 steps with a railing? : A Little 6 Click Score: 20    End of Session Equipment Utilized During Treatment: Gait belt Activity Tolerance: Patient tolerated treatment well Patient left: in bed;with call bell/phone within reach;with nursing/sitter in room Nurse Communication: Mobility status PT Visit Diagnosis: Unsteadiness on feet (R26.81);Other abnormalities of gait and mobility (R26.89)     Time: [id number] PT Time Calculation (min) (ACUTE ONLY): 18 min  Charges:  $Therapeutic Exercise: 8-22 mins                     Juan Juarez, DPT Acute Rehabilitation Services Office [phone] Pager [phone]    Jacques Navy 02/23/2021, 7:00 PM

## 2021-02-23 NOTE — Progress Notes (Signed)
[facility] MD Progress Note  02/23/2021 3:40 PM Juan Juarez  MRN:  [id number] Subjective:   Juan Juarez is a 65 yr old male who presents after a suicide attempt via OD. 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   He reports that he is doing okay today.  He reports that he slept well last night.  He reports that his appetite has been fine.  He reports no SI, HI, or AVH.  Asked him what happened last night as there were several notes in the chart about him attempting to break the mouse on the computer stand of his sitter.  He reported significant frustration and anger at still being here and being watched like he is.  Discussed that while I understood his frustration ask like this do not help him get out of the facility faster.  Discussed with him that a person from [place] will be coming by again today to do a second evaluation on him.  Discussed that if this evaluation goes well he may be able to discharge in the next day or 2 and asked that he continue to be patient with [place] as we get this process done.  He was a little annoyed throughout the whole interview but understood the importance of today's APS interview.  He reports no other concerns at present.   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 and substance abuse, Seizure Disorder, 1 previous suicide attempt (OD)  Past Medical History:  Past Medical History:  Diagnosis Date    Anxiety    Chronic elbow pain, left    Depression    Diabetes mellitus, type II ([place])    type II   Gout    Head injury 12/2017   Hepatitis C without hepatic coma    Hypertension    denies   Inflammatory arthropathy    left elbow   Insomnia    Non compliance w medication regimen    Osteoarthritis    Polysubstance abuse ([place])    Prostate cancer ([place])    Protein-calorie malnutrition, severe ([place]) 09/13/2016   Seizure ([place]) 12/2017   after head Injury   Thrombocytopenia [facility])     Past Surgical History:  Procedure Laterality Date   BURR HOLE Right 01/22/2018   Procedure: RIGHT BURR HOLES;  Surgeon: Newman Pies, MD;  Location: [place];  Service: Neurosurgery;  Laterality: Right;   I & D EXTREMITY Left 11/06/2018   Procedure: IRRIGATION AND DEBRIDEMENT LEFT ELBOW JOINT;  Surgeon: Charlotte Crumb, MD;  Location: [place];  Service: Orthopedics;  Laterality: Left;   INCISION AND DRAINAGE Left 10/24/2018   Procedure: INCISION AND DRAINAGE LEFT ELBOW WITH MASS EXCISION;  Surgeon: Charlotte Crumb, MD;  Location: [place];  Service: Orthopedics;  Laterality: Left;   MULTIPLE TOOTH EXTRACTIONS     None     Family History:  Family History  Problem Relation Age of Onset   Thyroid disease Sister    Colon cancer Neg Hx    Colon polyps Neg Hx    Breast cancer  Neg Hx    Prostate cancer Neg Hx    Pancreatic cancer Neg Hx    Family Psychiatric  History: None Reported Social History:  Social History   Substance and Sexual Activity  Alcohol Use Not Currently   Comment: quit 2019     Social History   Substance and Sexual Activity  Drug Use Not Currently   Types: Marijuana, Cocaine   Comment: cocaine last use 10/26/2018, Marijuana sping 2019    Social History   Socioeconomic History   Marital status: Divorced    Spouse name: Not on file   Number of children: Not on file   Years of education: Not on file   Highest education level: Not on file  Occupational History   Occupation:  disabled  Tobacco Use   Smoking status: Every Day    Packs/day: 0.50    Years: 50.00    Pack years: 25.00    Types: Cigarettes   Smokeless tobacco: Never  Vaping Use   Vaping Use: Never used  Substance and Sexual Activity   Alcohol use: Not Currently    Comment: quit 2019   Drug use: Not Currently    Types: Marijuana, Cocaine    Comment: cocaine last use 10/26/2018, Marijuana sping 2019   Sexual activity: Not Currently  Other Topics Concern   Not on file  Social History Narrative   Not on file   Social Determinants of Health   Financial Resource Strain: Not on file  Food Insecurity: Not on file  Transportation Needs: Not on file  Physical Activity: Not on file  Stress: Not on file  Social Connections: Not on file   Additional Social History:                         Sleep: Good  Appetite:  Good  Current Medications: Current Facility-Administered Medications  Medication Dose Route Frequency Provider Last Rate Last Admin   acetaminophen (TYLENOL) tablet 650 mg  650 mg Oral Q6H PRN Chalmers Guest, NP   650 mg at 02/11/21 1817   alum & mag hydroxide-simeth (MAALOX/MYLANTA) 200-200-20 MG/5ML suspension 30 mL  30 mL Oral Q4H PRN Chalmers Guest, NP       amLODipine (NORVASC) tablet 5 mg  5 mg Oral Daily Sharma Covert, MD   5 mg at 02/23/21 1209   bacitracin ointment   Topical BID Harlow Asa, MD   [id number] application at [id number]   cephALEXin (KEFLEX) capsule 500 mg  500 mg Oral Q6H Singleton, Amy E, MD   500 mg at 02/23/21 1209   cloBAZam (ONFI) tablet 10 mg  10 mg Oral QHS Margorie John W, PA-C   10 mg at 02/22/21 2129   feeding supplement (GLUCERNA SHAKE) (GLUCERNA SHAKE) liquid 237 mL  237 mL Oral BID BM Sharma Covert, MD   237 mL at 02/23/21 1259   hydrOXYzine (ATARAX/VISTARIL) tablet 25 mg  25 mg Oral TID PRN Chalmers Guest, NP   25 mg at 02/21/21 2102   insulin aspart (novoLOG) injection 0-9 Units  0-9 Units Subcutaneous TID WC Viann Fish  E, MD   3 Units at 02/23/21 1209   OLANZapine zydis (ZYPREXA) disintegrating tablet 5 mg  5 mg Oral Q8H PRN Harlow Asa, MD       And   LORazepam (ATIVAN) tablet 1 mg  1 mg Oral PRN Harlow Asa, MD       magnesium hydroxide (  MILK OF MAGNESIA) suspension 30 mL  30 mL Oral Daily PRN Chalmers Guest, NP       nicotine (NICODERM CQ - dosed in mg/24 hours) patch 21 mg  21 mg Transdermal Daily Sharma Covert, MD   21 mg at 02/19/21 1100   pantoprazole (PROTONIX) EC tablet 40 mg  40 mg Oral Daily Chalmers Guest, NP   40 mg at 02/23/21 1209   QUEtiapine (SEROQUEL) tablet 400 mg  400 mg Oral QHS Briant Cedar, MD   400 mg at 02/22/21 2129    Lab Results:  Results for orders placed or performed during the hospital encounter of 02/09/21 (from the past 48 hour(s))  Glucose, capillary     Status: Abnormal   Collection Time: 02/21/21  4:59 PM  Result Value Ref Range   Glucose-Capillary 232 (H) 70 - 99 mg/dL    Comment: Glucose reference range applies only to samples taken after fasting for at least 8 hours.  Glucose, capillary     Status: Abnormal   Collection Time: 02/21/21  8:41 PM  Result Value Ref Range   Glucose-Capillary 211 (H) 70 - 99 mg/dL    Comment: Glucose reference range applies only to samples taken after fasting for at least 8 hours.  Glucose, capillary     Status: Abnormal   Collection Time: 02/22/21  5:56 AM  Result Value Ref Range   Glucose-Capillary 286 (H) 70 - 99 mg/dL    Comment: Glucose reference range applies only to samples taken after fasting for at least 8 hours.  Glucose, capillary     Status: Abnormal   Collection Time: 02/22/21 11:15 AM  Result Value Ref Range   Glucose-Capillary 235 (H) 70 - 99 mg/dL    Comment: Glucose reference range applies only to samples taken after fasting for at least 8 hours.  Glucose, capillary     Status: Abnormal   Collection Time: 02/22/21  4:56 PM  Result Value Ref Range   Glucose-Capillary 282 (H) 70 - 99 mg/dL     Comment: Glucose reference range applies only to samples taken after fasting for at least 8 hours.  Glucose, capillary     Status: Abnormal   Collection Time: 02/23/21  7:17 AM  Result Value Ref Range   Glucose-Capillary 227 (H) 70 - 99 mg/dL    Comment: Glucose reference range applies only to samples taken after fasting for at least 8 hours.   Comment 1 Notify RN   Glucose, capillary     Status: Abnormal   Collection Time: 02/23/21 11:57 AM  Result Value Ref Range   Glucose-Capillary 233 (H) 70 - 99 mg/dL    Comment: Glucose reference range applies only to samples taken after fasting for at least 8 hours.    Blood Alcohol level:  Lab Results  Component Value Date   ETH <10 01/29/2021   ETH <10 [id number]    Metabolic Disorder Labs: Lab Results  Component Value Date   HGBA1C 5.8 (H) 01/30/2021   MPG 119.76 01/30/2021   MPG 283.35 09/02/2018   No results found for: PROLACTIN Lab Results  Component Value Date   CHOL 190 02/10/2021   TRIG 160 (H) 02/10/2021   HDL 26 (L) 02/10/2021   CHOLHDL 7.3 02/10/2021   VLDL 32 02/10/2021   LDLCALC 132 (H) 02/10/2021   LDLCALC 69 11/10/2015    Physical Findings: AIMS: Facial and Oral Movements Muscles of Facial Expression: None, normal Lips and Perioral Area: None, normal Jaw:  None, normal Tongue: None, normal,Extremity Movements Upper (arms, wrists, hands, fingers): None, normal Lower (legs, knees, ankles, toes): None, normal, Trunk Movements Neck, shoulders, hips: None, normal, Overall Severity Severity of abnormal movements (highest score from questions above): None, normal Incapacitation due to abnormal movements: None, normal Patient's awareness of abnormal movements (rate only patient's report): No Awareness, Dental Status Current problems with teeth and/or dentures?: No Does patient usually wear dentures?: No  CIWA:  CIWA-Ar Total: 0 COWS:     Musculoskeletal: Strength & Muscle Tone: decreased Gait & Station:  shuffle, with gait belt Patient leans: N/A  Psychiatric Specialty Exam:  Presentation  General Appearance: Disheveled; Casual  Eye Contact:Good  Speech:Normal Rate (mostly clear and coherent with some garbled words)  Speech Volume:Normal  Handedness:Right   Mood and Affect  Mood:Dysphoric  Affect:Appropriate; Congruent   Thought Process  Thought Processes:Coherent; Goal Directed  Descriptions of Associations:Intact  Orientation:Full (Time, Place and Person)  Thought Content:Logical; WDL  History of Schizophrenia/Schizoaffective disorder:No  Duration of Psychotic Symptoms:No data recorded Hallucinations:Hallucinations: None  Ideas of Reference:None  Suicidal Thoughts:Suicidal Thoughts: No  Homicidal Thoughts:Homicidal Thoughts: No   Sensorium  Memory:Immediate Fair; Recent Fair  Judgment:Poor  Insight:Poor   Executive Functions  Concentration:Fair  Attention Span:Good  [place]  Language:Good   Psychomotor Activity  Psychomotor Activity:Psychomotor Activity: Shuffling Gait   Assets  Assets:Resilience   Sleep  Sleep:Sleep: Fair Number of Hours of Sleep: 5.75    Physical Exam: Physical Exam Vitals and nursing note reviewed.  Constitutional:      General: He is not in acute distress.    Appearance: Normal appearance. He is normal weight. He is not ill-appearing or toxic-appearing.  HENT:     Head: Normocephalic and atraumatic.  Pulmonary:     Effort: Pulmonary effort is normal.  Musculoskeletal:        General: Normal range of motion.  Neurological:     General: No focal deficit present.     Mental Status: He is alert.   Review of Systems  Respiratory:  Negative for cough and shortness of breath.   Cardiovascular:  Negative for chest pain.  Gastrointestinal:  Negative for abdominal pain, constipation, diarrhea, nausea and vomiting.  Neurological:  Negative for weakness and headaches.   Psychiatric/Behavioral:  Negative for depression, hallucinations and suicidal ideas. The patient is not nervous/anxious.   Blood pressure 132/87, pulse 76, temperature 98.1 F (36.7 C), temperature source Oral, resp. rate 16, height 5' 6.75" (1.695 m), weight 83.5 kg, SpO2 100 %. Body mass index is 29.03 kg/m.   Treatment Plan Summary: Daily contact with patient to assess and evaluate symptoms and progress in treatment   Mr. Feltham is a 65 yr old male who presents after a suicide attempt via OD. 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     Had a long discussion with patient today about his behavior but I do believe that his outburst last night was due to his frustration of still being here.  He will be evaluated by the second APS person today.  If all goes well and he is able to be linked up with resources could potentially discharge in the next few days.  At this point we will not make any medication changes.  We will continue to monitor.     Unspecified bipolar and related d/o by hx (r/o mood  d/o secondary to TBI, r/o substance induced mood d/o - cocaine) Mild neurocognitive disorder -Continue Seroquel 400 mg QHS -Continue Agitation Protocol- Zyprexa/Ativan -APS will reexamine tomorrow     Pressure Wound to Left Buttocks: -Continue Keflex 500 mg q6 (Day 6 of 7) -Continue Bacitracin Ointment BID    Seizure Disorder: -Continue Onfi 10 mg QHS     HTN: -Continue Amlodipine 5 mg daily     Aspiration Pneumonitis(completed treatment):      Nicotine Dependence: -Continue Nicotine patch 21 mg daily     Elevated Glucose: -Continue SSI and FSBS with meals  - If FSBS continue to trend up may need restart of oral DM medication     Elevated TSH (normalized):     -Continue Protonix 40 mg  daily -Continue PRN's: Tylenol, Maalox, Atarax, Milk of Magnesia, Trazodone   Briant Cedar, MD 02/23/2021, 3:40 PM

## 2021-02-23 NOTE — Progress Notes (Signed)
1:1 note  Pt found in bed; allowed to rest. From report, pt continued to be agitated and aggressive last night. No signs of distress noted with equal and unlabored respirations noted. Pt safe on the unit. Q48msafety checks implemented and continued. Will continue to monitor. 1:1 continues for safety.

## 2021-02-23 NOTE — Progress Notes (Signed)
Psychoeducational Group Note  Date:  02/23/2021 Time:  2000  Group Topic/Focus:  Wrap up group  Participation Level: Did Not Attend  Participation Quality:  Not Applicable  Affect:  Not Applicable  Cognitive:  Not Applicable  Insight:  Not Applicable  Engagement in Group: Not Applicable  Additional Comments:  Did not attend.   [name] 02/23/2021, 8:56 PM

## 2021-02-24 LAB — GLUCOSE, CAPILLARY: Glucose-Capillary: 222 mg/dL — ABNORMAL HIGH (ref 70–99)

## 2021-02-24 MED ORDER — QUETIAPINE FUMARATE 400 MG PO TABS: 400.0000 mg | ORAL_TABLET | Freq: Every day | ORAL | 0 refills | Status: DC

## 2021-02-24 MED ORDER — AMLODIPINE BESYLATE 5 MG PO TABS: 5.0000 mg | ORAL_TABLET | Freq: Every day | ORAL | 0 refills | Status: DC

## 2021-02-24 MED ORDER — NICOTINE 21 MG/24HR TD PT24: 21.0000 mg | MEDICATED_PATCH | Freq: Every day | TRANSDERMAL | 0 refills | Status: DC

## 2021-02-24 MED ORDER — PANTOPRAZOLE SODIUM 40 MG PO TBEC: 40.0000 mg | DELAYED_RELEASE_TABLET | Freq: Every day | ORAL | 0 refills | Status: DC

## 2021-02-24 MED ORDER — CEPHALEXIN 500 MG PO CAPS: 500.0000 mg | ORAL_CAPSULE | Freq: Four times a day (QID) | ORAL | 0 refills | Status: DC

## 2021-02-24 MED ORDER — HYDROXYZINE HCL 25 MG PO TABS: 25.0000 mg | ORAL_TABLET | Freq: Three times a day (TID) | ORAL | 0 refills | Status: DC | PRN

## 2021-02-24 MED ORDER — BACITRACIN ZINC 500 UNIT/GM EX OINT: TOPICAL_OINTMENT | Freq: Two times a day (BID) | CUTANEOUS | 0 refills | Status: DC

## 2021-02-24 NOTE — [facility] Suicide Risk Assessment (Signed)
[facility] Discharge Suicide Risk Assessment   Principal Problem: Bipolar disorder, unspecified ([place]) Discharge Diagnoses: Principal Problem:   Bipolar disorder, unspecified ([place]) Active Problems:   Cocaine abuse ([place])   Seizure disorder ([place])   History of traumatic brain injury   Mild neurocognitive disorder   Total Time spent with patient: 20 minutes  Musculoskeletal: Strength & Muscle Tone: within normal limits Gait & Station: normal Patient leans: N/A  Psychiatric Specialty Exam  Presentation  General Appearance: Casual; Disheveled  Eye Contact:Good  Speech:Normal Rate (mix of coherent and garbled)  Speech Volume:Normal  Handedness:Right   Mood and Affect  Mood:Dysphoric  Duration of Depression Symptoms: Greater than two weeks  Affect:Appropriate; Congruent   Thought Process  Thought Processes:Coherent; Goal Directed  Descriptions of Associations:Intact  Orientation:Full (Time, Place and Person)  Thought Content:Logical; WDL  History of Schizophrenia/Schizoaffective disorder:No  Duration of Psychotic Symptoms:No data recorded Hallucinations:Hallucinations: None  Ideas of Reference:None  Suicidal Thoughts:Suicidal Thoughts: No  Homicidal Thoughts:Homicidal Thoughts: No   Sensorium  Memory:Immediate Fair; Recent Fair  Judgment:Poor  Insight:Poor   Executive Functions  Concentration:Fair  Attention Span:Good  [place]  Language:Good   Psychomotor Activity  Psychomotor Activity:Psychomotor Activity: Shuffling Gait   Assets  Assets:Resilience   Sleep  Sleep:Sleep: Fair Number of Hours of Sleep: 5.75   Physical Exam: Physical Exam Vitals and nursing note reviewed.  Constitutional:      Appearance: He is well-developed. He is obese.  HENT:     Head: Normocephalic and atraumatic.  Cardiovascular:     Rate and Rhythm: Normal rate.  Pulmonary:     Effort: Pulmonary effort is normal. No respiratory  distress.  Musculoskeletal:        General: Normal range of motion.  Neurological:     General: No focal deficit present.     Mental Status: He is alert and oriented to person, place, and time.  Psychiatric:        Mood and Affect: Mood normal.        Behavior: Behavior normal.   Review of Systems  Constitutional: Negative.   Respiratory: Negative.    Cardiovascular: Negative.   Psychiatric/Behavioral:  Negative for depression, hallucinations, memory loss, substance abuse and suicidal ideas. The patient is not nervous/anxious and does not have insomnia.   Blood pressure 127/73, pulse 82, temperature 98.1 F (36.7 C), temperature source Oral, resp. rate 16, height 5' 6.75" (1.695 m), weight 83.5 kg, SpO2 100 %. Body mass index is 29.03 kg/m.  Mental Status Per Nursing Assessment::   On Admission:  Suicidal ideation indicated by patient  Demographic Factors:  Male, Age 66 or older, Caucasian, Living alone, and Unemployed  Loss Factors: Legal issues - DUI's  Historical Factors: Impulsivity  Risk Reduction Factors:   Sense of responsibility to family, Positive social support, Positive therapeutic relationship, and Positive coping skills or problem solving skills  Continued Clinical Symptoms:  Bipolar Disorder:   Depressive phase Alcohol/Substance Abuse/Dependencies Previous Psychiatric Diagnoses and Treatments Medical Diagnoses and Treatments/Surgeries  Cognitive Features That Contribute To Risk:  None    Suicide Risk:  Minimal: No identifiable suicidal ideation.  Patients presenting with no risk factors but with morbid ruminations; may be classified as minimal risk based on the severity of the depressive symptoms   Follow-up Information     [facility]. Go on 03/14/2021.   Why: You have an appointment with [name] for medication management services on 03/14/21 at 11:00 am.   This appointment will be held  in person. (or you can call ahead of time to switch  to Virtual)  * Provider will need intake paperwork completed prior to appt. Contact information: [address] [place], [place], [place] [address]  P: [phone] F: [phone]        [facility]. Call.   Why: A referral has been made to this provider for therapy and/or medication management services.  Please call to schedule an appointment upon discharge. Contact information: [place] [address] [phone]         Glenda Chroman, MD Follow up.   Specialty: Internal Medicine Why: Follow up with your PCP provider for ongoing health maintenance upon discharge. Contact information: [address] [place] [address] [phone]         [facility]. Call.   Why: Referral has been made.  Suanne Marker will be reaching out to follow up with referral.  They can help with basic needs including assistance with transportation and other services for medical needs. Contact information: Contact: Suanne Marker [phone] [address],  [place], [place] [address]        [facility] caseworker. Call.   Why: Nanci Pina will be reaching out to follow up with extra supports and services to assist and support. Contact informationCarmelina Paddock [phone] ext [phone]                Plan Of Care/Follow-up recommendations:  Activity:  ad lib Diet:  as tolerated On day of discharge following sustained improvement in the affect of this patient, continued report of euthymic mood, repeated denial of suicidal, homicidal, and other violent ideation, adequate interaction with peers, active participation in groups while on the unit, and denial of adverse reactions from medications, the treatment team decided Oleksandr Venus was stable for discharge home with scheduled mental health treatment as noted above.  He was able to engage in safety planning including plan to return to [facility] or contact emergency services if he feels unable to maintain his own safety or the  safety of others. Patient had no further questions, comments, or concerns. Discharge into care of his sister, who agrees to maintain patient safety.  Patient aware to return to nearest crisis center, ED or to call 911 for worsening symptoms of depression, suicidal or homicidal thoughts or AVH.    Lavella Hammock, MD 02/24/2021, 11:34 AM

## 2021-02-24 NOTE — Progress Notes (Addendum)
Nursing 1:1 note D:Pt observed sitting on the side of the bed. Pt stated he slept well and felt his head was a little clearer. A: 1:1 observation continues for safety  R: Pt remains safe

## 2021-02-24 NOTE — Plan of Care (Signed)
Discharge note  Patient verbalizes readiness for discharge. Follow up plan explained, AVS, Transition record and SRA given. Prescriptions and teaching provided. Belongings returned and signed for. Suicide safety plan completed and signed. Patient verbalizes understanding. Patient denies SI/HI and assures this Probation officer they will seek assistance should that change. Patient discharged to lobby where family was waiting.  Problem: Education: Goal: Knowledge of [place] Education information/materials will improve Outcome: Adequate for Discharge Goal: Emotional status will improve Outcome: Adequate for Discharge Goal: Mental status will improve Outcome: Adequate for Discharge Goal: Verbalization of understanding the information provided will improve Outcome: Adequate for Discharge   Problem: Activity: Goal: Interest or engagement in activities will improve 02/24/2021 1302 by Baron Sane, RN Outcome: Adequate for Discharge 02/24/2021 1123 by Baron Sane, RN Outcome: Progressing Goal: Sleeping patterns will improve Outcome: Adequate for Discharge   Problem: Coping: Goal: Ability to verbalize frustrations and anger appropriately will improve Outcome: Adequate for Discharge Goal: Ability to demonstrate self-control will improve Outcome: Adequate for Discharge   Problem: Health Behavior/Discharge Planning: Goal: Identification of resources available to assist in meeting health care needs will improve 02/24/2021 1302 by Baron Sane, RN Outcome: Adequate for Discharge 02/24/2021 1123 by Baron Sane, RN Outcome: Progressing Goal: Compliance with treatment plan for underlying cause of condition will improve 02/24/2021 1302 by Baron Sane, RN Outcome: Adequate for Discharge 02/24/2021 1123 by Baron Sane, RN Outcome: Progressing   Problem: Physical Regulation: Goal: Ability to maintain clinical measurements within normal limits will  improve Outcome: Adequate for Discharge   Problem: Safety: Goal: Periods of time without injury will increase Outcome: Adequate for Discharge   Problem: Education: Goal: Ability to make informed decisions regarding treatment will improve Outcome: Adequate for Discharge   Problem: Coping: Goal: Coping ability will improve Outcome: Adequate for Discharge   Problem: Health Behavior/Discharge Planning: Goal: Identification of resources available to assist in meeting health care needs will improve Outcome: Adequate for Discharge   Problem: Medication: Goal: Compliance with prescribed medication regimen will improve Outcome: Adequate for Discharge   Problem: Self-Concept: Goal: Ability to disclose and discuss suicidal ideas will improve Outcome: Adequate for Discharge Goal: Will verbalize positive feelings about self Outcome: Adequate for Discharge   Problem: Activity: Goal: Will identify at least one activity in which they can participate Outcome: Adequate for Discharge   Problem: Coping: Goal: Ability to identify and develop effective coping behavior will improve Outcome: Adequate for Discharge Goal: Ability to interact with others will improve Outcome: Adequate for Discharge Goal: Demonstration of participation in decision-making regarding own care will improve Outcome: Adequate for Discharge Goal: Ability to use eye contact when communicating with others will improve Outcome: Adequate for Discharge   Problem: Health Behavior/Discharge Planning: Goal: Identification of resources available to assist in meeting health care needs will improve Outcome: Adequate for Discharge   Problem: Self-Concept: Goal: Will verbalize positive feelings about self Outcome: Adequate for Discharge

## 2021-02-24 NOTE — Progress Notes (Signed)
1:1 note  Pt found in bed; allowed to rest. Pt's sitter states the pt awoke to eat breakfast but failed to come for medications. Pt still sleeping. Pt safe on the unit. Q51msafety checks implemented and continued. Will continue to monitor. 1:1 continues for safety.

## 2021-02-24 NOTE — Progress Notes (Signed)
[facility] 1:1 Observation Documentation  For the first (8) hours following discontinuation of 1:1 precautions, a progress note entry by nursing staff should be documented at least every 2 hours, reflecting the patient's behavior, condition, mood, and conversation.  Use the progress notes for additional entries.  Time 1:1 discontinued:  1000  Patient's Behavior:  pt has been resting and compliant with treatment plan  Patient's Condition:  pt resting  Patient's Conversation:  pt has been asleep during time this Probation officer has cared for pt.  [place] 02/24/2021, 10:43 AM

## 2021-02-24 NOTE — Progress Notes (Signed)
Recreation Therapy Notes  Date: 8.26.22 Time: 0930 Location: [address] Dayroom  Group Topic: Stress Management   Goal Area(s) Addresses:  Patient will actively participate in stress management techniques presented during session.  Patient will successfully identify benefit of practicing stress management post d/c.   Intervention: Relaxation exercise with ambient sound and script   Activity: Guided Imagery. LRT provided education, instruction, and demonstration on practice of visualization via guided imagery. Patients was asked to participate in the technique introduced during session. Patients were given suggestions of ways to access scripts post d/c and encouraged to explore Youtube and other apps available on smartphones, tablets, and computers.  Education:  Stress Management, Discharge Planning.   Education Outcome: Acknowledges education  Clinical Observations/Feedback: Patient did not attend group session.    Victorino Sparrow, LRT/CTRS         Victorino Sparrow A 02/24/2021 10:56 AM

## 2021-02-24 NOTE — Plan of Care (Signed)
  Problem: Activity: Goal: Interest or engagement in activities will improve Outcome: Progressing   Problem: Health Behavior/Discharge Planning: Goal: Identification of resources available to assist in meeting health care needs will improve Outcome: Progressing Goal: Compliance with treatment plan for underlying cause of condition will improve Outcome: Progressing

## 2021-02-24 NOTE — Progress Notes (Signed)
Pt did not attend group was still sleeping.

## 2021-02-24 NOTE — Discharge Summary (Signed)
Physician Discharge Summary Note  Patient:  Juan Juarez is an 65 y.o., male MRN:  [id number] DOB:  Nov 12, 1955 Patient phone:  [phone] (home)  Patient address:   [address] [place] [address] [place] [address],  Total Time spent with patient: 30 minutes  Date of Admission:  02/09/2021 Date of Discharge: 02/24/2021  Reason for Admission:  Per H&P-  "Patient is seen and examined.  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  The patient was found unresponsive in a local hotel room.  He was brought to the emergency department by emergency medical services.  The patient had apparently made comments earlier of wanting to kill himself.  The patient was found with multiple pill bottles around the scene.  He was given Narcan at the scene without any success.  The empty bottles around him included Zoloft, metformin, clonazepam, Xanax.  He was noted to be hypoxic and hypotensive on arrival to the hospital.  He was treated with a bag mask valve without difficulty and his oxygen saturation increased to 100% without difficulty.  He was intubated at that time.  His drug screen revealed benzodiazepines as well as cocaine.  Laboratories on admission also revealed normocytic anemia, hyperglycemia, BUN and creatinine ratio of 20/1.37.  His albumin was 2.9, lactic acid 1.4.  Acetaminophen ethanol and salicylate levels were all negative.  His ammonia was 26.  He had a CT scan of the head that in the emergency department was interpreted as no acute intracranial  abnormality.  My review of the results revealed moderate parenchymal volume loss, asymmetrically more severe involving the right temporal lobe.  This appeared to be similar to previous examination.  There was borderline ventriculomegaly as well as asymmetrically more severe involving the right lateral ventricle likely represented the sequela of central atrophy as well as result and ex vacuo dilatation.  There was an interval development of a remote lacunar infarct within the left thalamus as well.  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  There was no evidence of acute intracranial hemorrhage Juarez infarct.  It was thought to reveal relatively advanced senescent changes, asymmetrically more severe involving the right temporal lobe, and possibly related to remote trauma.  He does have a history of traumatic brain injury.  There was also interval development of a remote lacunar infarct within the left thalamus.  Also seen on the skull were bur holes within the right frontal and parietal regions were again noted.  He apparently has had intracranial elevated pressure that required this.  His urinalysis showed greater than 300 mg per DL of protein.  He had significant hematuria.  He was extubated on 01/31/2021.  A palliative care medicine consultation was obtained on 01/31/2021.  The patient has a history of prostate cancer that was made earlier this year.  He was scheduled to have radiation therapy in June, but was in jail because of her previously missed court date.  He appears to become delirious during the course of the hospitalization, and received haloperidol for his aggressive behaviors.  Psychiatric consultation was attempted on 2 occasions, and  a Education officer, museum finally met with the patient on 02/05/2021.  He stated at that time that he did not really want to die.  He stated he received his depressive medicine as well as anxiety medicine as an outpatient on the  [facility].  He denied any previous psychiatric admissions.  He stated at that time that he had been sober of alcohol for "quite a while".  He told the evaluating team at that time it was 5 Juarez 6 years.  While there several Education officer, museum notes I do not see any notes on psychiatric consultation.  There is a note from 8/11 in which the nurse practitioner recommended Haldol as a standing order on admission.  The patient was admitted to the psychiatric facility on 02/09/2021.  On arrival shortly thereafter in the evening he had a fall, but apparently suffered no repercussions of that.   On examination today he is alert and oriented to 3.  He has slurred speech as well as some cognitive deficits.  On original examination he stated that he thought the current president was Juan Juarez, but with some assistance was able to remember that Juan Juarez was the current president and that Juan Juarez had been present prior to that.  He stated that he had not had any alcohol in 5 Juarez 6 years.  He stated that he was upset and wanted to take the overdose because he was scheduled to go back to court for a DUI.  He stated he did not want to go back to court.  He stated he had been living in the hotel for well over a year.  He stated that he had moved out from his mother's quite a while ago because "we were too similar".  He denied any current suicidal ideation.  He denied any auditory Juarez visual hallucinations.  He denied any previous psychiatric admissions.  He stated the last time that he had been in any form of a rehabilitation facility was many years ago.  Review of the PMP database revealed his last prescription for Xanax was on 01/17/2021.  He received 60 tablets at that time.  He is on Onfi 10 mg p.o. nightly for seizure disorder.  It does appear that he has been receiving the Xanax for greater than 1 year.  His gait is unstable.  And physical therapy consultation has been requested.  He stated that his goal was to return to the  hotel.  He was admitted to the hospital for evaluation and stabilization."   Principal Problem: Bipolar disorder, unspecified ([place]) Discharge Diagnoses: Principal Problem:   Bipolar disorder, unspecified ([place]) Active Problems:   Cocaine abuse ([place])   Seizure disorder ([place])   History of traumatic brain injury   Mild neurocognitive disorder   Past Psychiatric History: Depression, Anxiety, Bipolar Disorder, polysubstance use disorders, probable cognitive disorder secondary to a previous TBI and substance abuse, Seizure Disorder, 1 previous suicide attempt ([place])  Past Medical History:  Past Medical History:  Diagnosis Date   Anxiety    Chronic elbow pain, left    Depression    Diabetes mellitus, type II ([place])    type II   Gout    Head injury 12/2017   Hepatitis C without hepatic coma    Hypertension    denies   Inflammatory arthropathy    left elbow   Insomnia    Non compliance w medication regimen    Osteoarthritis    Polysubstance abuse ([place])    Prostate cancer ([place])  Protein-calorie malnutrition, severe ([place]) 09/13/2016   Seizure ([place]) 12/2017   after head Injury   Thrombocytopenia [facility])     Past Surgical History:  Procedure Laterality Date   BURR HOLE Right 01/22/2018   Procedure: RIGHT BURR HOLES;  Surgeon: Newman Pies, MD;  Location: [facility];  Service: Neurosurgery;  Laterality: Right;   I & D EXTREMITY Left 11/06/2018   Procedure: IRRIGATION AND DEBRIDEMENT LEFT ELBOW JOINT;  Surgeon: Charlotte Crumb, MD;  Location: [facility];  Service: Orthopedics;  Laterality: Left;   INCISION AND DRAINAGE Left 10/24/2018   Procedure: INCISION AND DRAINAGE LEFT ELBOW WITH MASS EXCISION;  Surgeon: Charlotte Crumb, MD;  Location: [facility];  Service: Orthopedics;  Laterality: Left;   MULTIPLE TOOTH EXTRACTIONS     None     Family History:  Family History  Problem Relation Age of Onset   Thyroid disease Sister    Colon cancer Neg Hx    Colon polyps Neg Hx    Breast cancer Neg Hx     Prostate cancer Neg Hx    Pancreatic cancer Neg Hx    Family Psychiatric  History: None Reported Social History:  Social History   Substance and Sexual Activity  Alcohol Use Not Currently   Comment: quit 2019     Social History   Substance and Sexual Activity  Drug Use Not Currently   Types: Marijuana, Cocaine   Comment: cocaine last use 10/26/2018, Marijuana sping 2019    Social History   Socioeconomic History   Marital status: Divorced    Spouse name: Not on file   Number of children: Not on file   Years of education: Not on file   Highest education level: Not on file  Occupational History   Occupation: disabled  Tobacco Use   Smoking status: Every Day    Packs/day: 0.50    Years: 50.00    Pack years: 25.00    Types: Cigarettes   Smokeless tobacco: Never  Vaping Use   Vaping Use: Never used  Substance and Sexual Activity   Alcohol use: Not Currently    Comment: quit 2019   Drug use: Not Currently    Types: Marijuana, Cocaine    Comment: cocaine last use 10/26/2018, Marijuana sping 2019   Sexual activity: Not Currently  Other Topics Concern   Not on file  Social History Narrative   Not on file   Social Determinants of Health   Financial Resource Strain: Not on file  Food Insecurity: Not on file  Transportation Needs: Not on file  Physical Activity: Not on file  Stress: Not on file  Social Connections: Not on file    Hospital Course:  Patient presented to [facility] ED on 7/31 via EMS after an attempted suicide via Overdose.  He was admitted to [facility] on 8/12.  He was restarted on Xanax, Onfi, Haldol, Seroquel, and Doxepin.  His Haldol, Xanax, and Doxepin were reduced.  He responded well to this reduction.  On admission he was found to have pneumonia so was treated with Augmentin to which he responded well.  A MOCA score was obtained with a score of 17.  Due to this an APS report was placed. However, he was found to not need a Guardian.  APS will follow up  with him outpatient to continue to help him.  During his hospitalization he develop a pressure sore on his Left Buttock which was treated with Keflex and it healed well.  He was discharged to his  hotel room.   On day of discharge patient reports that he is doing well today.  He reports that he slept well last night.  He reports that his appetite remains good.  He reports no SI, HI, Juarez AVH.  He reports that he is excited to be discharged.  Discussed with him the importance of taking his medications properly when he discharges.  He did have expressed concern about his belongings.  Discussed with him that they had been in a locker that stayed locked the entire time he was admitted.  He reported no concerns at present.  He was discharged to his hotel room.   Physical Findings:   Musculoskeletal: Strength & Muscle Tone: decreased Gait & Station: shuffle Patient leans: N/A   Psychiatric Specialty Exam:  Presentation  General Appearance: Casual; Disheveled  Eye Contact:Good  Speech:Normal Rate (mix of coherent and garbled)  Speech Volume:Normal  Handedness:Right   Mood and Affect  Mood:Dysphoric  Affect:Appropriate; Congruent   Thought Process  Thought Processes:Coherent; Goal Directed  Descriptions of Associations:Intact  Orientation:Full (Time, Place and Person)  Thought Content:Logical; WDL  History of Schizophrenia/Schizoaffective disorder:No  Duration of Psychotic Symptoms:No data recorded Hallucinations:Hallucinations: None Ideas of Reference:None  Suicidal Thoughts:Suicidal Thoughts: No Homicidal Thoughts:Homicidal Thoughts: No  Sensorium  Memory:Immediate Fair; Recent Fair  Judgment:Poor  Insight:Poor   Executive Functions  Concentration:Fair  Attention Span:Good  [place]  Language:Good   Psychomotor Activity  Psychomotor Activity: Psychomotor Activity: Shuffling Gait  Assets  Assets:Resilience   Sleep   Sleep: Sleep: Fair Number of Hours of Sleep: 5.75   Physical Exam: Physical Exam Vitals and nursing note reviewed.  Constitutional:      General: He is not in acute distress.    Appearance: Normal appearance. He is normal weight. He is not ill-appearing Juarez toxic-appearing.  HENT:     Head: Normocephalic and atraumatic.  Pulmonary:     Effort: Pulmonary effort is normal.  Musculoskeletal:        General: Normal range of motion.  Neurological:     General: No focal deficit present.     Mental Status: He is alert.   Review of Systems  Respiratory:  Negative for cough and shortness of breath.   Cardiovascular:  Negative for chest pain.  Gastrointestinal:  Negative for abdominal pain, constipation, diarrhea, nausea and vomiting.  Neurological:  Negative for weakness and headaches.  Psychiatric/Behavioral:  Negative for depression, hallucinations and suicidal ideas. The patient is not nervous/anxious.   Blood pressure 129/73, pulse 82, temperature 98.1 F (36.7 C), temperature source Oral, resp. rate 16, height 5' 6.75" (1.695 m), weight 83.5 kg, SpO2 100 %. Body mass index is 29.03 kg/m.   Social History   Tobacco Use  Smoking Status Every Day   Packs/day: 0.50   Years: 50.00   Pack years: 25.00   Types: Cigarettes  Smokeless Tobacco Never   Tobacco Cessation:  A prescription for an FDA-approved tobacco cessation medication provided at discharge   Blood Alcohol level:  Lab Results  Component Value Date   [facility] <10 01/29/2021   ETH <10 29/47/6546    Metabolic Disorder Labs:  Lab Results  Component Value Date   HGBA1C 5.8 (H) 01/30/2021   MPG 119.76 01/30/2021   MPG 283.35 09/02/2018   No results found for: PROLACTIN Lab Results  Component Value Date   CHOL 190 02/10/2021   TRIG 160 (H) 02/10/2021   HDL 26 (L) 02/10/2021   CHOLHDL 7.3 02/10/2021   VLDL  32 02/10/2021   LDLCALC 132 (H) 02/10/2021   LDLCALC 69 11/10/2015    See Psychiatric Specialty Exam  and Suicide Risk Assessment completed by Attending Physician prior to discharge.  Discharge destination:  Home  Is patient on multiple antipsychotic therapies at discharge:  No   Has Patient had three Juarez more failed trials of antipsychotic monotherapy by history:  No  Recommended Plan for Multiple Antipsychotic Therapies: NA   Prescriptions given at discharge. Patient agreeable to plan. Given opportunity to ask questions. Appears to feel comfortable with discharge denies any current suicidal Juarez homicidal thought.  Patient is also instructed prior to discharge to: Take all medications as prescribed by mental healthcare provider. Report any adverse effects and Juarez reactions from the medicines to outpatient provider promptly. Patient has been instructed & cautioned: To not engage in alcohol and Juarez illegal drug use while on prescription medicines. In the event of worsening symptoms,  patient is instructed to call the crisis hotline, 911 and Juarez go to the nearest ED for appropriate evaluation and treatment of symptoms. To follow-up with primary care provider for other medical issues, concerns and Juarez health care needs  The patient was evaluated each day by a clinical provider to ascertain response to treatment. Improvement was noted by the patient's report of decreasing symptoms, improved sleep and appetite, affect, medication tolerance, behavior, and participation in unit programming.  Patient was asked each day to complete a self inventory noting mood, mental status, pain, new symptoms, anxiety and concerns.  Patient responded well to medication and being in a therapeutic and supportive environment. Positive and appropriate behavior was noted and the patient was motivated for recovery. The patient worked closely with the treatment team and case manager to develop a discharge plan with appropriate goals. Coping skills, problem solving as well as relaxation therapies were also part of the unit  programming.  By the day of discharge patient was in much improved condition than upon admission.  Symptoms were reported as significantly decreased Juarez resolved completely. The patient denied SI/HI and voiced no AVH. The patient was motivated to continue taking medication with a goal of continued improvement in mental health.   Patient was discharged home with a plan to follow up as noted below.    Allergies as of 02/24/2021   No Known Allergies      Medication List     STOP taking these medications    ALPRAZolam 0.5 MG tablet Commonly known as: XANAX   doxepin 75 MG capsule Commonly known as: SINEQUAN   gabapentin 300 MG capsule Commonly known as: NEURONTIN   glipiZIDE 5 MG 24 hr tablet Commonly known as: GLUCOTROL XL   Levemir 100 UNIT/ML injection Generic drug: insulin detemir   metFORMIN 500 MG tablet Commonly known as: GLUCOPHAGE   sertraline 100 MG tablet Commonly known as: ZOLOFT   simvastatin 10 MG tablet Commonly known as: ZOCOR       TAKE these medications      Indication  amLODipine 5 MG tablet Commonly known as: NORVASC Take 1 tablet (5 mg total) by mouth daily. For high blood pressure  Indication: High Blood Pressure Disorder   bacitracin ointment Apply topically 2 (two) times daily. May buy from over the counter): For wound care  Indication: Wound care   blood glucose meter kit and supplies Dispense based on patient and insurance preference. Use up to four times daily as directed. (FOR ICD-10 E10.9, E11.9).  Indication: Diabetes management   cephALEXin 500 MG  capsule Commonly known as: KEFLEX Take 1 capsule (500 mg total) by mouth every 6 (six) hours. For infection  Indication: Infection   cloBAZam 10 MG tablet Commonly known as: ONFI clobazam 10 mg tablet  TAKE ONE TABLET BY MOUTH EVERY DAY  Indication: Lennox-Gastaut Syndrome   Easy Comfort Insulin Syringe 31G X 5/16" 1 ML Misc Generic drug: Insulin Syringe-Needle U-100 USE AS  DIRECTED ONCE DAILY FOR LEVEMIR FLEXTOUCH 100 DAY SUPPLY  Indication: Diabetes management   hydrOXYzine 25 MG tablet Commonly known as: ATARAX/VISTARIL Take 1 tablet (25 mg total) by mouth 3 (three) times daily as needed for anxiety.  Indication: Feeling Anxious   nicotine 21 mg/24hr patch Commonly known as: NICODERM CQ - dosed in mg/24 hours Place 1 patch (21 mg total) onto the skin daily. (May buy from over the counter): For smoking cessation  Indication: Nicotine Addiction   pantoprazole 40 MG tablet Commonly known as: PROTONIX Take 1 tablet (40 mg total) by mouth daily. For acid reflux  Indication: Gastroesophageal Reflux Disease   QUEtiapine 400 MG tablet Commonly known as: SEROQUEL Take 1 tablet (400 mg total) by mouth at bedtime. For mood control What changed:  medication strength how much to take when to take this additional instructions  Indication: Mood control        Follow-up Information     [facility]. Go on 03/14/2021.   Why: You have an appointment with [name] for medication management services on 03/14/21 at 11:00 am.   This appointment will be held in person. (Juarez you can call ahead of time to switch to Virtual)  * Provider will need intake paperwork completed prior to appt. Contact information: [address] [place], [place], [place] [address]  P: [phone] F: [phone]        Services, [facility]. Call.   Why: A referral has been made to this provider for therapy and/Juarez medication management services.  Please call to schedule an appointment upon discharge. Contact information: [place] [address] [phone]         Glenda Chroman, MD Follow up.   Specialty: Internal Medicine Why: Follow up with your PCP provider for ongoing health maintenance upon discharge. Contact information: [address] [place] [address] [phone]         [facility]. Call.   Why: Referral has  been made.  Suanne Marker will be reaching out to follow up with referral.  They can help with basic needs including assistance with transportation and other services for medical needs. Contact information: Contact: Suanne Marker [phone] [address]  [place], [place] [address]        APS caseworker. Call.   Why: Nanci Pina will be reaching out to follow up with extra supports and services to assist and support. Contact information: Carmelina Paddock [phone] ext [phone]                Follow-up recommendations:   - Activity as tolerated. - Diet as recommended by PCP. - Keep all scheduled follow-up appointments as recommended.  Comments:  Patient is instructed to take all prescribed medications as recommended. Report any side effects Juarez adverse reactions to your outpatient psychiatrist. Patient is instructed to abstain from alcohol and illegal drugs while on prescription medications. In the event of worsening symptoms, patient is instructed to call the crisis hotline, 911, Juarez go to the nearest emergency department for evaluation and treatment.  Signed: Briant Cedar, MD 02/24/2021,  11:06 AM

## 2021-02-24 NOTE — Progress Notes (Signed)
Nursing 1:1 note D:Pt observed snoring in bed. RR even and unlabored. No distress noted. A: 1:1 observation continues for safety  R: pt remains safe

## 2021-02-24 NOTE — Care Management Important Message (Signed)
Medicare IM given to social work to give to the patient

## 2021-02-24 NOTE — Tx Team (Signed)
Interdisciplinary Treatment and Diagnostic Plan Update  02/24/2021 Time of Session: 11:05 Juan Juarez MRN: [id number]  Principal Diagnosis: Bipolar disorder, unspecified ([place])  Secondary Diagnoses: Principal Problem:   Bipolar disorder, unspecified ([place]) Active Problems:   Cocaine abuse ([place])   Seizure disorder ([place])   History of traumatic brain injury   Mild neurocognitive disorder   Current Medications:  Current Facility-Administered Medications  Medication Dose Route Frequency Provider Last Rate Last Admin   acetaminophen (TYLENOL) tablet 650 mg  650 mg Oral Q6H PRN Chalmers Guest, NP   650 mg at 02/11/21 1817   alum & mag hydroxide-simeth (MAALOX/MYLANTA) 200-200-20 MG/5ML suspension 30 mL  30 mL Oral Q4H PRN Chalmers Guest, NP       amLODipine (NORVASC) tablet 5 mg  5 mg Oral Daily Sharma Covert, MD   5 mg at 02/23/21 1209   bacitracin ointment   Topical BID Harlow Asa, MD   08.1448 application at 18/56/31 0826   cephALEXin (KEFLEX) capsule 500 mg  500 mg Oral Q6H Nelda Marseille, Amy E, MD   500 mg at 02/24/21 4970   cloBAZam (ONFI) tablet 10 mg  10 mg Oral QHS Margorie John W, PA-C   10 mg at 02/23/21 2135   feeding supplement (GLUCERNA SHAKE) (GLUCERNA SHAKE) liquid 237 mL  237 mL Oral BID BM Sharma Covert, MD   237 mL at 02/23/21 1739   hydrOXYzine (ATARAX/VISTARIL) tablet 25 mg  25 mg Oral TID PRN Chalmers Guest, NP   25 mg at 02/23/21 2135   insulin aspart (novoLOG) injection 0-9 Units  0-9 Units Subcutaneous TID WC Harlow Asa, MD   3 Units at 02/24/21 0620   OLANZapine zydis (ZYPREXA) disintegrating tablet 5 mg  5 mg Oral Q8H PRN Harlow Asa, MD       And   LORazepam (ATIVAN) tablet 1 mg  1 mg Oral PRN Harlow Asa, MD       magnesium hydroxide (MILK OF MAGNESIA) suspension 30 mL  30 mL Oral Daily PRN Chalmers Guest, NP       nicotine (NICODERM CQ - dosed in mg/24 hours) patch 21 mg  21 mg Transdermal Daily Sharma Covert, MD   21 mg at  02/19/21 1100   pantoprazole (PROTONIX) EC tablet 40 mg  40 mg Oral Daily Chalmers Guest, NP   40 mg at 02/23/21 1209   QUEtiapine (SEROQUEL) tablet 400 mg  400 mg Oral QHS Briant Cedar, MD   400 mg at 02/23/21 2135   PTA Medications: Medications Prior to Admission  Medication Sig Dispense Refill Last Dose   ALPRAZolam (XANAX) 0.5 MG tablet Take 0.5 mg by mouth at bedtime as needed.      blood glucose meter kit and supplies Dispense based on patient and insurance preference. Use up to four times daily as directed. (FOR ICD-10 E10.9, E11.9). 1 each 0    cloBAZam (ONFI) 10 MG tablet clobazam 10 mg tablet  TAKE ONE TABLET BY MOUTH EVERY DAY      doxepin (SINEQUAN) 75 MG capsule Take 75 mg by mouth.      EASY COMFORT INSULIN SYRINGE 31G X 5/16" 1 ML MISC USE AS DIRECTED ONCE DAILY FOR LEVEMIR FLEXTOUCH 100 DAY SUPPLY      gabapentin (NEURONTIN) 300 MG capsule Take by mouth.      glipiZIDE (GLUCOTROL XL) 5 MG 24 hr tablet Take 5 mg by mouth daily.  LEVEMIR 100 UNIT/ML injection Inject 0.4 mLs (40 Units total) into the skin daily. 10 mL 0    metFORMIN (GLUCOPHAGE) 500 MG tablet Take 500 mg by mouth 2 (two) times daily.      QUEtiapine (SEROQUEL) 100 MG tablet Take 1 tablet (100 mg total) by mouth 2 (two) times daily. 60 tablet 0    sertraline (ZOLOFT) 100 MG tablet Take 100 mg by mouth daily.      simvastatin (ZOCOR) 10 MG tablet Take 10 mg by mouth daily.       Patient Stressors: Marital or family conflict Medication change or noncompliance  Patient Strengths: Technical sales engineer for treatment/growth  Treatment Modalities: Medication Management, Group therapy, Case management,  1 to 1 session with clinician, Psychoeducation, Recreational therapy.   Physician Treatment Plan for Primary Diagnosis: Bipolar disorder, unspecified ([place]) Long Term Goal(s): Improvement in symptoms so as ready for discharge   Short Term Goals: Ability to identify and develop  effective coping behaviors will improve Compliance with prescribed medications will improve Ability to identify triggers associated with substance abuse/mental health issues will improve Ability to identify changes in lifestyle to reduce recurrence of condition will improve Ability to verbalize feelings will improve Ability to disclose and discuss suicidal ideas Ability to demonstrate self-control will improve  Medication Management: Evaluate patient's response, side effects, and tolerance of medication regimen.  Therapeutic Interventions: 1 to 1 sessions, Unit Group sessions and Medication administration.  Evaluation of Outcomes: Adequate for Discharge  Physician Treatment Plan for Secondary Diagnosis: Principal Problem:   Bipolar disorder, unspecified ([place]) Active Problems:   Cocaine abuse ([place])   Seizure disorder ([place])   History of traumatic brain injury   Mild neurocognitive disorder  Long Term Goal(s): Improvement in symptoms so as ready for discharge   Short Term Goals: Ability to identify and develop effective coping behaviors will improve Compliance with prescribed medications will improve Ability to identify triggers associated with substance abuse/mental health issues will improve Ability to identify changes in lifestyle to reduce recurrence of condition will improve Ability to verbalize feelings will improve Ability to disclose and discuss suicidal ideas Ability to demonstrate self-control will improve     Medication Management: Evaluate patient's response, side effects, and tolerance of medication regimen.  Therapeutic Interventions: 1 to 1 sessions, Unit Group sessions and Medication administration.  Evaluation of Outcomes: Adequate for Discharge   RN Treatment Plan for Primary Diagnosis: Bipolar disorder, unspecified ([place]) Long Term Goal(s): Knowledge of disease and therapeutic regimen to maintain health will improve  Short Term Goals: Ability to verbalize  feelings will improve, Ability to disclose and discuss suicidal ideas, and Ability to identify and develop effective coping behaviors will improve  Medication Management: RN will administer medications as ordered by provider, will assess and evaluate patient's response and provide education to patient for prescribed medication. RN will report any adverse and/or side effects to prescribing provider.  Therapeutic Interventions: 1 on 1 counseling sessions, Psychoeducation, Medication administration, Evaluate responses to treatment, Monitor vital signs and CBGs as ordered, Perform/monitor CIWA, COWS, AIMS and Fall Risk screenings as ordered, Perform wound care treatments as ordered.  Evaluation of Outcomes: Adequate for Discharge   LCSW Treatment Plan for Primary Diagnosis: Bipolar disorder, unspecified ([place]) Long Term Goal(s): Safe transition to appropriate next level of care at discharge, Engage patient in therapeutic group addressing interpersonal concerns.  Short Term Goals: Engage patient in aftercare planning with referrals and resources, Increase ability to appropriately verbalize feelings, and Increase emotional regulation  Therapeutic Interventions: Assess for all discharge needs, 1 to 1 time with Education officer, museum, Explore available resources and support systems, Assess for adequacy in community support network, Educate family and significant other(s) on suicide prevention, Complete Psychosocial Assessment, Interpersonal group therapy.  Evaluation of Outcomes: Adequate for Discharge   Progress in Treatment: Attending groups: Yes. and No. Participating in groups: Yes. and No. Taking medication as prescribed: No. Toleration medication: Yes. Family/Significant other contact made: Yes, individual(s) contacted:  Mother and daughter  Patient understands diagnosis: No. Discussing patient identified problems/goals with staff: Yes. Medical problems stabilized or resolved: Yes. Denies  suicidal/homicidal ideation: Yes. Issues/concerns per patient self-inventory: No. Other: None  New problem(s) identified: No, Describe:  None  New Short Term/Long Term Goal(s):medication stabilization, elimination of SI thoughts, development of comprehensive mental wellness plan.   Patient Goals:  "get more exercise."   Discharge Plan or Barriers: Pt will be discharged to the hotel he was living at prior to admission. Pt has referrals for APS support services and [facility]  Reason for Continuation of Hospitalization: Anxiety Depression Medication stabilization  Estimated Length of Stay: 3-5 days  Attendees: Patient: Juan Juarez 02/24/2021   Physician:  02/24/2021   Nursing:  02/24/2021   RN Care Manager: 02/24/2021   Social Worker: Darletta Moll, LCSW 02/24/2021   Recreational Therapist:  02/24/2021   Other:  02/24/2021   Other:  02/24/2021   Other: 02/24/2021     Scribe for Treatment Team: Mliss Fritz, [place] 02/24/2021 10:30 AM

## 2021-02-24 NOTE — Progress Notes (Signed)
  [facility] Adult Case Management Discharge Plan :  Will you be returning to the same living situation after discharge:  Yes,  back to motel, [place] At discharge, do you have transportation home?: Yes,  sister is picking up. Do you have the ability to pay for your medications: Yes,  insurance  Release of information consent forms completed and in the chart;  Patient's signature needed at discharge.  Patient to Follow up at:  Follow-up Information     [facility]. Go on 03/14/2021.   Why: You have an appointment with [name] for medication management services on 03/14/21 at 11:00 am.   This appointment will be held in person. (or you can call ahead of time to switch to Virtual)  * Provider will need intake paperwork completed prior to appt. Contact information: [address] [place], [place], [place] [address]  P: [phone] F: [phone]        Services, [facility]. Call.   Why: A referral has been made to this provider for therapy and/or medication management services.  Please call to schedule an appointment upon discharge. Contact information: [place] [address] [phone]         Glenda Chroman, MD Follow up.   Specialty: Internal Medicine Why: Follow up with your PCP provider for ongoing health maintenance upon discharge. Contact information: [address] [place] [address] [phone]         [facility]. Call.   Why: Referral has been made.  Suanne Marker will be reaching out to follow up with referral.  They can help with basic needs including assistance with transportation and other services for medical needs. Contact information: Contact: Suanne Marker [phone] [address],  [place], [place] [address]        [facility] caseworker. Call.   Why: Nanci Pina will be reaching out to follow up with extra supports and services to assist and support. Contact information: Carmelina Paddock [phone] ext [phone]                 Next level of care provider has access to [place] and Suicide Prevention discussed: Yes,  sister and mother     Has patient been referred to the Quitline?: Patient refused referral  Patient has been referred for addiction treatment: N/A  Ridley Schewe E Antionio Negron, LCSW 02/24/2021, 10:51 AM

## 2021-02-24 NOTE — Progress Notes (Signed)
[facility] 1:1 Observation Documentation  For the first (8) hours following discontinuation of 1:1 precautions, a progress note entry by nursing staff should be documented at least every 2 hours, reflecting the patient's behavior, condition, mood, and conversation.  Use the progress notes for additional entries.  Time 1:1 discontinued:  1000  Patient's Behavior:  pt has been seen in the hallway ambulating  Patient's Condition:  pt denies any complaints or pain  Patient's Conversation:  pt is oriented and has made conversation based in reality.  Juan Juarez 02/24/2021, 1:02 PM

## 2021-03-17 DIAGNOSIS — F411 Generalized anxiety disorder: Secondary | ICD-10-CM | POA: Diagnosis not present

## 2021-03-17 DIAGNOSIS — F331 Major depressive disorder, recurrent, moderate: Secondary | ICD-10-CM | POA: Diagnosis not present

## 2021-03-24 ENCOUNTER — Encounter: Payer: Self-pay | Admitting: Urology

## 2021-03-24 ENCOUNTER — Other Ambulatory Visit: Payer: Self-pay

## 2021-03-24 ENCOUNTER — Ambulatory Visit (INDEPENDENT_AMBULATORY_CARE_PROVIDER_SITE_OTHER): Payer: Medicare Other | Admitting: Urology

## 2021-03-24 VITALS — BP 164/81 | HR 67

## 2021-03-24 DIAGNOSIS — N401 Enlarged prostate with lower urinary tract symptoms: Secondary | ICD-10-CM | POA: Diagnosis not present

## 2021-03-24 DIAGNOSIS — N138 Other obstructive and reflux uropathy: Secondary | ICD-10-CM

## 2021-03-24 DIAGNOSIS — R3912 Poor urinary stream: Secondary | ICD-10-CM | POA: Diagnosis not present

## 2021-03-24 DIAGNOSIS — C61 Malignant neoplasm of prostate: Secondary | ICD-10-CM | POA: Diagnosis not present

## 2021-03-24 NOTE — Progress Notes (Signed)
 03/24/2021 9:42 AM   Juan Juarez 10/17/1955 [id number]  Referring provider: Vyas, Dhruv B, MD [address] [place],  [place] [address]  Followup prostate cancer   HPI: Juan Juarez is a 65yo here for followup for prostate cancer. He was last seen 08/2020 for high risk prostate cancer. PSA 19 at that time. Patient was seen by Radiation Oncology 10/2020 and it was recommended her start ADT and then have IMRT. Patient has not returned for followup. No new bone pain. No worsening LUTS. No explained weight loss. No other complaints today   PMH: Past Medical History:  Diagnosis Date   Anxiety    Chronic elbow pain, left    Depression    Diabetes mellitus, type II (HCC)    type II   Gout    Head injury 12/2017   Hepatitis C without hepatic coma    Hypertension    denies   Inflammatory arthropathy    left elbow   Insomnia    Non compliance w medication regimen    Osteoarthritis    Polysubstance abuse (HCC)    Prostate cancer (HCC)    Protein-calorie malnutrition, severe (HCC) 09/13/2016   Seizure (HCC) 12/2017   after head Injury   Thrombocytopenia (HCC)     Surgical History: Past Surgical History:  Procedure Laterality Date   BURR HOLE Right 01/22/2018   Procedure: RIGHT BURR HOLES;  Surgeon: Jenkins, Jeffrey, MD;  Location: [facility] OR;  Service: Neurosurgery;  Laterality: Right;   I & D EXTREMITY Left 11/06/2018   Procedure: IRRIGATION AND DEBRIDEMENT LEFT ELBOW JOINT;  Surgeon: Weingold, Matthew, MD;  Location: [facility] OR;  Service: Orthopedics;  Laterality: Left;   INCISION AND DRAINAGE Left 10/24/2018   Procedure: INCISION AND DRAINAGE LEFT ELBOW WITH MASS EXCISION;  Surgeon: Weingold, Matthew, MD;  Location: [facility] OR;  Service: Orthopedics;  Laterality: Left;   MULTIPLE TOOTH EXTRACTIONS     None      Home Medications:  Allergies as of 03/24/2021   No Known Allergies      Medication List        Accurate as of March 24, 2021  9:42 AM. If you have any questions, ask your nurse  or doctor.          ALPRAZolam 0.5 MG tablet Commonly known as: XANAX Take 0.5 mg by mouth 2 (two) times daily as needed.   amLODipine 5 MG tablet Commonly known as: NORVASC Take 1 tablet (5 mg total) by mouth daily. For high blood pressure   bacitracin ointment Apply topically 2 (two) times daily. May buy from over the counter): For wound care   blood glucose meter kit and supplies Dispense based on patient and insurance preference. Use up to four times daily as directed. (FOR ICD-10 E10.9, E11.9).   cephALEXin 500 MG capsule Commonly known as: KEFLEX Take 1 capsule (500 mg total) by mouth every 6 (six) hours. For infection   cloBAZam 10 MG tablet Commonly known as: ONFI clobazam 10 mg tablet  TAKE ONE TABLET BY MOUTH EVERY DAY   clonazePAM 0.5 MG tablet Commonly known as: KLONOPIN Take 0.5 mg by mouth 2 (two) times daily as needed.   doxepin 75 MG capsule Commonly known as: SINEQUAN Take 75-150 mg by mouth at bedtime as needed.   Easy Comfort Insulin Syringe 31G X 5/16" 1 ML Misc Generic drug: Insulin Syringe-Needle U-100 USE AS DIRECTED ONCE DAILY FOR LEVEMIR FLEXTOUCH 100 DAY SUPPLY   glipiZIDE 5 MG 24 hr tablet Commonly   known as: GLUCOTROL XL Take 5 mg by mouth daily.   hydrOXYzine 25 MG tablet Commonly known as: ATARAX/VISTARIL Take 1 tablet (25 mg total) by mouth 3 (three) times daily as needed for anxiety.   metFORMIN 500 MG tablet Commonly known as: GLUCOPHAGE Take 500 mg by mouth 2 (two) times daily.   nicotine 21 mg/24hr patch Commonly known as: NICODERM CQ - dosed in mg/24 hours Place 1 patch (21 mg total) onto the skin daily. (May buy from over the counter): For smoking cessation   pantoprazole 40 MG tablet Commonly known as: PROTONIX Take 1 tablet (40 mg total) by mouth daily. For acid reflux   QUEtiapine 400 MG tablet Commonly known as: SEROQUEL Take 1 tablet (400 mg total) by mouth at bedtime. For mood control   sertraline 100 MG  tablet Commonly known as: ZOLOFT Take 100 mg by mouth daily.   thiamine 100 MG tablet Commonly known as: VITAMIN B-1 Take by mouth.        Allergies: No Known Allergies  Family History: Family History  Problem Relation Age of Onset   Thyroid disease Sister    Colon cancer Neg Hx    Colon polyps Neg Hx    Breast cancer Neg Hx    Prostate cancer Neg Hx    Pancreatic cancer Neg Hx     Social History:  reports that he has been smoking cigarettes. He has a 25.00 pack-year smoking history. He has never used smokeless tobacco. He reports that he does not currently use alcohol. He reports that he does not currently use drugs after having used the following drugs: Marijuana and Cocaine.  ROS: All other review of systems were reviewed and are negative except what is noted above in HPI  Physical Exam: BP (!) 164/81   Pulse 67   Constitutional:  Alert and oriented, No acute distress. HEENT: Juan Juarez AT, moist mucus membranes.  Trachea midline, no masses. Cardiovascular: No clubbing, cyanosis, or edema. Respiratory: Normal respiratory effort, no increased work of breathing. GI: Abdomen is soft, nontender, nondistended, no abdominal masses GU: No CVA tenderness.  Lymph: No cervical or inguinal lymphadenopathy. Skin: No rashes, bruises or suspicious lesions. Neurologic: Grossly intact, no focal deficits, moving all 4 extremities. Psychiatric: Normal mood and affect.  Laboratory Data: Lab Results  Component Value Date   WBC 7.8 02/13/2021   HGB 12.7 (L) 02/13/2021   HCT 37.0 (L) 02/13/2021   MCV 91.8 02/13/2021   PLT 124 (L) 02/13/2021    Lab Results  Component Value Date   CREATININE 1.13 02/13/2021    No results found for: PSA  No results found for: TESTOSTERONE  Lab Results  Component Value Date   HGBA1C 5.8 (H) 01/30/2021    Urinalysis    Component Value Date/Time   COLORURINE YELLOW 02/02/2021 2053   APPEARANCEUR CLEAR 02/02/2021 2053   APPEARANCEUR Clear  08/29/2020 1313   LABSPEC 1.012 02/02/2021 2053   PHURINE 6.0 02/02/2021 2053   GLUCOSEU NEGATIVE 02/02/2021 2053   HGBUR MODERATE (A) 02/02/2021 2053   BILIRUBINUR NEGATIVE 02/02/2021 2053   BILIRUBINUR Negative 08/29/2020 1313   KETONESUR NEGATIVE 02/02/2021 2053   PROTEINUR >=300 (A) 02/02/2021 2053   UROBILINOGEN 0.2 07/01/2014 2244   NITRITE NEGATIVE 02/02/2021 2053   LEUKOCYTESUR NEGATIVE 02/02/2021 2053    Lab Results  Component Value Date   LABMICR See below: 08/29/2020   WBCUA None seen 08/29/2020   LABEPIT None seen 08/29/2020   MUCUS Present 08/29/2020   BACTERIA NONE   SEEN 02/02/2021    Pertinent Imaging:  No results found for this or any previous visit.  No results found for this or any previous visit.  No results found for this or any previous visit.  No results found for this or any previous visit.  No results found for this or any previous visit.  No results found for this or any previous visit.  No results found for this or any previous visit.  No results found for this or any previous visit.   Assessment & Plan:    1. Prostate cancer ([place]) -We will obtain repeat staging CT and bone scan based on PSA. RTC 2 weeks - Urinalysis, Routine w reflex microscopic - PSA - Basic metabolic panel  2. BPh with weak urinary stream -Patient defers therapy at this time  No follow-ups on file.  Nicolette Bang, MD  [facility] [place]

## 2021-03-24 NOTE — Progress Notes (Signed)
Urological Symptom Review  Patient is experiencing the following symptoms: Get up at night to urinate Trouble starting stream Injury to kidneys/bladder   Review of Systems  Gastrointestinal (upper)  : Negative for upper GI symptoms  Gastrointestinal (lower) : Negative for lower GI symptoms  Constitutional : Negative for symptoms  Skin: Negative for skin symptoms  Eyes: Blurred vision  Ear/Nose/Throat : Negative for Ear/Nose/Throat symptoms  Hematologic/Lymphatic: Easy bruising  Cardiovascular : Negative for cardiovascular symptoms  Respiratory : Negative for respiratory symptoms  Endocrine: Negative for endocrine symptoms  Musculoskeletal: Negative for musculoskeletal symptoms  Neurological: Negative for neurological symptoms  Psychologic: Depression Anxiety

## 2021-03-24 NOTE — Patient Instructions (Signed)
Prostate Cancer The prostate is a small gland that produces fluid that makes up semen (seminal fluid). It is located below the bladder in men, in front of the rectum. Prostate cancer is the abnormal growth of cells in the prostate gland. What are the causes? The exact cause of this condition is not known. What increases the risk? You are more likely to develop this condition if: You are 66 years of age or older. You have a family history of prostate cancer. You have a family history of breast and ovarian cancer. You have genes that are passed from parent to child (inherited), such as BRCA1 and BRCA2. You have Lynch syndrome. African American men and men of African descent are diagnosed with prostate cancer at higher rates than other men. The reasons for this are not well understood and are likely due to a combination of genetic and environmental factors. What are the signs or symptoms? Symptoms of this condition include: Problems with urination. This may include: A weak or interrupted flow of urine. Trouble starting or stopping urination. Trouble emptying the bladder all the way. The need to urinate more often, especially at night. Blood in urine or semen. Persistent pain or discomfort in the lower back, lower abdomen, or hips. Trouble getting an erection. Weakness or numbness in the legs or feet. How is this diagnosed? This condition can be diagnosed with: A digital rectal exam. For this exam, a health care provider inserts a gloved finger into the rectum to feel the prostate gland. A blood test called a prostate-specific antigen (PSA) test. A procedure in which a sample of tissue is taken from the prostate and checked under a microscope (prostate biopsy). An imaging test called transrectal ultrasonography. Once the condition is diagnosed, tests will be done to determine how far the cancer has spread. This is called staging the cancer. Staging may involve imaging tests, such as a bone  scan, CT scan, PET scan, or MRI. Stages of prostate cancer The stages of prostate cancer are as follows: Stage 1 (I). At this stage, the cancer is found in the prostate only. The cancer is not visible on imaging tests, and it is usually found by accident, such as during prostate surgery. Stage 2 (II). At this stage, the cancer is more advanced than it is in stage 1, but the cancer has not spread outside the prostate. Stage 3 (III). At this stage, the cancer has spread beyond the outer layer of the prostate to nearby tissues. The cancer may be found in the seminal vesicles, which are near the bladder and the prostate. Stage 4 (IV). At this stage, the cancer has spread to other parts of the body, such as the lymph nodes, bones, bladder, rectum, liver, or lungs. Prostate cancer grading Prostate cancer is also graded according to how the cancer cells look under a microscope. This is called the Gleason score and the total score can range from 6-10, indicating how likely it is that the cancer will spread (metastasize) to other parts of the body. The higher the score, the greater the likelihood that the cancer will spread. Gleason 6 or lower: This indicates that the cancer cells look similar to normal prostate cells (well differentiated). Gleason 7: This indicates that the cancer cells look somewhat similar to normal prostate cells (moderately differentiated). Gleason 8, 9, or 10: This indicates that the cancer cells look very different than normal prostate cells (poorly differentiated). How is this treated? Treatment for this condition depends on several factors,  including the stage of the cancer, your age, personal preferences, and your overall health. Talk with your health care provider about treatment options that are recommended for you. Common treatments include: Observation for early stage prostate cancer (active surveillance). This involves having exams, blood tests, and in some cases, more biopsies.  For some men, this is the only treatment needed. Surgery. Types of surgeries include: Open surgery (radical prostatectomy). In this surgery, a larger incision is made to remove the prostate. A laparoscopic radical prostatectomy. This is a surgery to remove the prostate and lymph nodes through several small incisions. It is often referred to as a minimally invasive surgery. A robotic radical prostatectomy. This is laparoscopic surgery to remove the prostate and lymph nodes with the help of robotic arms that are controlled by the surgeon. Cryoablation. This is surgery to freeze and destroy cancer cells. Radiation treatment. Types of radiation treatment include: External beam radiation. This type aims beams of radiation from outside the body at the prostate to destroy cancerous cells. Brachytherapy. This type uses radioactive needles, seeds, wires, or tubes that are implanted into the prostate gland. Like external beam radiation, brachytherapy destroys cancerous cells. An advantage is that this type of radiation limits the damage to surrounding tissue and has fewer side effects. Chemotherapy. This treatment kills cancer cells or stops them from multiplying. It kills both cancer cells and normal cells. Targeted therapy. This treatment uses medicines to kill cancer cells without damaging normal cells. Hormone treatment. This treatment involves taking medicines that act on testosterone, one of the male hormones, by: Stopping your body from producing testosterone. Blocking testosterone from reaching cancer cells. Follow these instructions at home: Lifestyle Do not use any products that contain nicotine or tobacco. These products include cigarettes, chewing tobacco, and vaping devices, such as e-cigarettes. If you need help quitting, ask your health care provider. Eat a healthy diet. To do this: Eat foods that are high in fiber. These include beans, whole grains, and fresh fruits and vegetables. Limit  foods that are high in fat and sugar. These include fried or sweet foods. Treatment for prostate cancer may affect sexual function. If you have a partner, continue to have intimate moments. This may include touching, holding, hugging, and caressing your partner. Get plenty of sleep. Consider joining a support group for men who have prostate cancer. Meeting with a support group may help you learn to manage the stress of having cancer. General instructions Take over-the-counter and prescription medicines only as told by your health care provider. If you have to go to the hospital, notify your cancer specialist (oncologist). Keep all follow-up visits. This is important. Where to find more information American Cancer Society: www.cancer.org American Society of Clinical Oncology: www.cancer.net National Cancer Institute: www.cancer.gov Contact a health care provider if: You have new or increasing trouble urinating. You have new or increasing blood in your urine. You have new or increasing pain in your hips, back, or chest. Get help right away if: You have weakness or numbness in your legs. You cannot control urination or your bowel movements (incontinence). You have chills or a fever. Summary The prostate is a small gland that is involved in the production of semen. It is located below a man's bladder, in front of the rectum. Prostate cancer is the abnormal growth of cells in the prostate gland. Treatment for this condition depends on the stage of the cancer, your age, personal preferences, and your overall health. Talk with your health care provider about treatment   options that are recommended for you. Consider joining a support group for men who have prostate cancer. Meeting with a support group may help you learn to manage the stress of having cancer. This information is not intended to replace advice given to you by your health care provider. Make sure you discuss any questions you have with  your health care provider. Document Revised: 09/14/2020 Document Reviewed: 09/14/2020 Elsevier Patient Education  2022 Elsevier Inc.  

## 2021-03-25 LAB — BASIC METABOLIC PANEL
BUN/Creatinine Ratio: 21 (ref 10–24)
BUN: 26 mg/dL (ref 8–27)
CO2: 19 mmol/L — ABNORMAL LOW (ref 20–29)
Calcium: 9.3 mg/dL (ref 8.6–10.2)
Chloride: 103 mmol/L (ref 96–106)
Creatinine, Ser: 1.25 mg/dL (ref 0.76–1.27)
Glucose: 113 mg/dL — ABNORMAL HIGH (ref 65–99)
Potassium: 4.2 mmol/L (ref 3.5–5.2)
Sodium: 136 mmol/L (ref 134–144)
eGFR: 64 mL/min/{1.73_m2} (ref >59)

## 2021-03-25 LAB — PSA: Prostate Specific Ag, Serum: 22.9 ng/mL — ABNORMAL HIGH (ref 0.0–4.0)

## 2021-04-05 ENCOUNTER — Ambulatory Visit (INDEPENDENT_AMBULATORY_CARE_PROVIDER_SITE_OTHER): Payer: Medicare Other | Admitting: Urology

## 2021-04-05 ENCOUNTER — Other Ambulatory Visit: Payer: Self-pay

## 2021-04-05 VITALS — BP 165/84 | HR 67 | Temp 98.7°F | Ht 66.0 in | Wt 200.0 lb

## 2021-04-05 DIAGNOSIS — C61 Malignant neoplasm of prostate: Secondary | ICD-10-CM

## 2021-04-05 NOTE — H&P (View-Only) (Signed)
04/05/2021 11:55 AM   Juan Juarez 05-18-1956 [id number]  Referring provider: Ignatius Specking, MD [address] [place],  [place] [address]  Followup prostate cancer   HPI: Juan Juarez is [phone] here for followup for prostate cancer. PSA stable at 22.9. No worsening LUTS. He has not met with with Dr. Langston Masker in radiation oncology. No new complaints today   PMH: Past Medical History:  Diagnosis Date   Anxiety    Chronic elbow pain, left    Depression    Diabetes mellitus, type II (HCC)    type II   Gout    Head injury 12/2017   Hepatitis C without hepatic coma    Hypertension    denies   Inflammatory arthropathy    left elbow   Insomnia    Non compliance w medication regimen    Osteoarthritis    Polysubstance abuse (HCC)    Prostate cancer (HCC)    Protein-calorie malnutrition, severe (HCC) 09/13/2016   Seizure (HCC) 12/2017   after head Injury   Thrombocytopenia [facility])     Surgical History: Past Surgical History:  Procedure Laterality Date   BURR HOLE Right 01/22/2018   Procedure: RIGHT BURR HOLES;  Surgeon: Tressie Stalker, MD;  Location: [facility] OR;  Service: Neurosurgery;  Laterality: Right;   I & D EXTREMITY Left 11/06/2018   Procedure: IRRIGATION AND DEBRIDEMENT LEFT ELBOW JOINT;  Surgeon: Dairl Ponder, MD;  Location: [facility] OR;  Service: Orthopedics;  Laterality: Left;   INCISION AND DRAINAGE Left 10/24/2018   Procedure: INCISION AND DRAINAGE LEFT ELBOW WITH MASS EXCISION;  Surgeon: Dairl Ponder, MD;  Location: [facility] OR;  Service: Orthopedics;  Laterality: Left;   MULTIPLE TOOTH EXTRACTIONS     None      Home Medications:  Allergies as of 04/05/2021   No Known Allergies      Medication List        Accurate as of April 05, 2021 11:55 AM. If you have any questions, ask your nurse or doctor.          ALPRAZolam 0.5 MG tablet Commonly known as: XANAX Take 0.5 mg by mouth 2 (two) times daily as needed.   amLODipine 5 MG tablet Commonly known as:  NORVASC Take 1 tablet (5 mg total) by mouth daily. For high blood pressure   bacitracin ointment Apply topically 2 (two) times daily. May buy from over the counter): For wound care   blood glucose meter kit and supplies Dispense based on patient and insurance preference. Use up to four times daily as directed. (FOR ICD-10 E10.9, E11.9).   cephALEXin 500 MG capsule Commonly known as: KEFLEX Take 1 capsule (500 mg total) by mouth every 6 (six) hours. For infection   cloBAZam 10 MG tablet Commonly known as: ONFI clobazam 10 mg tablet  TAKE ONE TABLET BY MOUTH EVERY DAY   clonazePAM 0.5 MG tablet Commonly known as: KLONOPIN Take 0.5 mg by mouth 2 (two) times daily as needed.   doxepin 75 MG capsule Commonly known as: SINEQUAN Take 75-150 mg by mouth at bedtime as needed.   Easy Comfort Insulin Syringe 31G X 5/16" 1 ML Misc Generic drug: Insulin Syringe-Needle U-100 USE AS DIRECTED ONCE DAILY FOR LEVEMIR FLEXTOUCH 100 DAY SUPPLY   glipiZIDE 5 MG 24 hr tablet Commonly known as: GLUCOTROL XL Take 5 mg by mouth daily.   hydrOXYzine 25 MG tablet Commonly known as: ATARAX/VISTARIL Take 1 tablet (25 mg total) by mouth 3 (three) times daily as  needed for anxiety.   metFORMIN 500 MG tablet Commonly known as: GLUCOPHAGE Take 500 mg by mouth 2 (two) times daily.   nicotine 21 mg/24hr patch Commonly known as: NICODERM CQ - dosed in mg/24 hours Place 1 patch (21 mg total) onto the skin daily. (May buy from over the counter): For smoking cessation   pantoprazole 40 MG tablet Commonly known as: PROTONIX Take 1 tablet (40 mg total) by mouth daily. For acid reflux   QUEtiapine 400 MG tablet Commonly known as: SEROQUEL Take 1 tablet (400 mg total) by mouth at bedtime. For mood control   sertraline 100 MG tablet Commonly known as: ZOLOFT Take 100 mg by mouth daily.   thiamine 100 MG tablet Commonly known as: VITAMIN B-1 Take by mouth.        Allergies: No Known  Allergies  Family History: Family History  Problem Relation Age of Onset   Thyroid disease Sister    Colon cancer Neg Hx    Colon polyps Neg Hx    Breast cancer Neg Hx    Prostate cancer Neg Hx    Pancreatic cancer Neg Hx     Social History:  reports that he has been smoking cigarettes. He has a 25.00 pack-year smoking history. He has never used smokeless tobacco. He reports that he does not currently use alcohol. He reports that he does not currently use drugs after having used the following drugs: Marijuana and Cocaine.  ROS: All other review of systems were reviewed and are negative except what is noted above in HPI  Physical Exam: BP (!) 165/84 (BP Location: Right Arm)   Pulse 67   Temp 98.7 F (37.1 C)   Ht 5\' 6"  (1.676 m)   Wt 200 lb (90.7 kg)   BMI 32.28 kg/m   Constitutional:  Alert and oriented, No acute distress. HEENT: [place] AT, moist mucus membranes.  Trachea midline, no masses. Cardiovascular: No clubbing, cyanosis, or edema. Respiratory: Normal respiratory effort, no increased work of breathing. GI: Abdomen is soft, nontender, nondistended, no abdominal masses GU: No CVA tenderness.  Lymph: No cervical or inguinal lymphadenopathy. Skin: No rashes, bruises or suspicious lesions. Neurologic: Grossly intact, no focal deficits, moving all 4 extremities. Psychiatric: Normal mood and affect.  Laboratory Data: Lab Results  Component Value Date   WBC 7.8 02/13/2021   HGB 12.7 (L) 02/13/2021   HCT 37.0 (L) 02/13/2021   MCV 91.8 02/13/2021   PLT 124 (L) 02/13/2021    Lab Results  Component Value Date   CREATININE 1.25 03/24/2021    No results found for: PSA  No results found for: TESTOSTERONE  Lab Results  Component Value Date   HGBA1C 5.8 (H) 01/30/2021    Urinalysis    Component Value Date/Time   COLORURINE YELLOW 02/02/2021 2053   APPEARANCEUR CLEAR 02/02/2021 2053   APPEARANCEUR Clear 08/29/2020 1313   LABSPEC 1.012 02/02/2021 2053   PHURINE  6.0 02/02/2021 2053   GLUCOSEU NEGATIVE 02/02/2021 2053   HGBUR MODERATE (A) 02/02/2021 2053   BILIRUBINUR NEGATIVE 02/02/2021 2053   BILIRUBINUR Negative 08/29/2020 1313   KETONESUR NEGATIVE 02/02/2021 2053   PROTEINUR >=300 (A) 02/02/2021 2053   UROBILINOGEN 0.2 07/01/2014 2244   NITRITE NEGATIVE 02/02/2021 2053   LEUKOCYTESUR NEGATIVE 02/02/2021 2053    Lab Results  Component Value Date   LABMICR See below: 08/29/2020   WBCUA None seen 08/29/2020   LABEPIT None seen 08/29/2020   MUCUS Present 08/29/2020   BACTERIA NONE SEEN 02/02/2021  Pertinent Imaging:  No results found for this or any previous visit.  No results found for this or any previous visit.  No results found for this or any previous visit.  No results found for this or any previous visit.  No results found for this or any previous visit.  No results found for this or any previous visit.  No results found for this or any previous visit.  No results found for this or any previous visit.   Assessment & Plan:    1. Prostate cancer ([place]) -Schedule for fiducial and [facility] - CT Abdomen Pelvis W Wo Contrast - NM Bone Scan Whole Body   No follow-ups on file.  Nicolette Bang, MD  [facility] [place]

## 2021-04-05 NOTE — Progress Notes (Signed)
04/05/2021 11:55 AM   Claudia Pollock 21-May-1956 [id number]  Referring provider: Glenda Chroman, MD [place],  [place] [address]  Followup prostate cancer   HPI: Mr Kiner is a [phone] here for followup for prostate cancer. PSA stable at 22.9. No worsening LUTS. He has not met with with Dr. Lynnette Caffey in radiation oncology. No new complaints today   PMH: Past Medical History:  Diagnosis Date   Anxiety    Chronic elbow pain, left    Depression    Diabetes mellitus, type II ([place])    type II   Gout    Head injury 12/2017   Hepatitis C without hepatic coma    Hypertension    denies   Inflammatory arthropathy    left elbow   Insomnia    Non compliance w medication regimen    Osteoarthritis    Polysubstance abuse ([place])    Prostate cancer ([place])    Protein-calorie malnutrition, severe ([place]) 09/13/2016   Seizure ([place]) 12/2017   after head Injury   Thrombocytopenia [facility]     Surgical History: Past Surgical History:  Procedure Laterality Date   BURR HOLE Right 01/22/2018   Procedure: RIGHT BURR HOLES;  Surgeon: Newman Pies, MD;  Location: [facility];  Service: Neurosurgery;  Laterality: Right;   I & D EXTREMITY Left 11/06/2018   Procedure: IRRIGATION AND DEBRIDEMENT LEFT ELBOW JOINT;  Surgeon: Charlotte Crumb, MD;  Location: [facility];  Service: Orthopedics;  Laterality: Left;   INCISION AND DRAINAGE Left 10/24/2018   Procedure: INCISION AND DRAINAGE LEFT ELBOW WITH MASS EXCISION;  Surgeon: Charlotte Crumb, MD;  Location: [facility];  Service: Orthopedics;  Laterality: Left;   MULTIPLE TOOTH EXTRACTIONS     None      Home Medications:  Allergies as of 04/05/2021   No Known Allergies      Medication List        Accurate as of April 05, 2021 11:55 AM. If you have any questions, ask your nurse or doctor.          ALPRAZolam 0.5 MG tablet Commonly known as: XANAX Take 0.5 mg by mouth 2 (two) times daily as needed.   amLODipine 5 MG tablet Commonly known as:  NORVASC Take 1 tablet (5 mg total) by mouth daily. For high blood pressure   bacitracin ointment Apply topically 2 (two) times daily. May buy from over the counter): For wound care   blood glucose meter kit and supplies Dispense based on patient and insurance preference. Use up to four times daily as directed. (FOR ICD-10 E10.9, E11.9).   cephALEXin 500 MG capsule Commonly known as: KEFLEX Take 1 capsule (500 mg total) by mouth every 6 (six) hours. For infection   cloBAZam 10 MG tablet Commonly known as: ONFI clobazam 10 mg tablet  TAKE ONE TABLET BY MOUTH EVERY DAY   clonazePAM 0.5 MG tablet Commonly known as: KLONOPIN Take 0.5 mg by mouth 2 (two) times daily as needed.   doxepin 75 MG capsule Commonly known as: SINEQUAN Take 75-150 mg by mouth at bedtime as needed.   Easy Comfort Insulin Syringe 31G X 5/16" 1 ML Misc Generic drug: Insulin Syringe-Needle U-100 USE AS DIRECTED ONCE DAILY FOR LEVEMIR FLEXTOUCH 100 DAY SUPPLY   glipiZIDE 5 MG 24 hr tablet Commonly known as: GLUCOTROL XL Take 5 mg by mouth daily.   hydrOXYzine 25 MG tablet Commonly known as: ATARAX/VISTARIL Take 1 tablet (25 mg total) by mouth 3 (three) times daily as  needed for anxiety.   metFORMIN 500 MG tablet Commonly known as: GLUCOPHAGE Take 500 mg by mouth 2 (two) times daily.   nicotine 21 mg/24hr patch Commonly known as: NICODERM CQ - dosed in mg/24 hours Place 1 patch (21 mg total) onto the skin daily. (May buy from over the counter): For smoking cessation   pantoprazole 40 MG tablet Commonly known as: PROTONIX Take 1 tablet (40 mg total) by mouth daily. For acid reflux   QUEtiapine 400 MG tablet Commonly known as: SEROQUEL Take 1 tablet (400 mg total) by mouth at bedtime. For mood control   sertraline 100 MG tablet Commonly known as: ZOLOFT Take 100 mg by mouth daily.   thiamine 100 MG tablet Commonly known as: VITAMIN B-1 Take by mouth.        Allergies: No Known  Allergies  Family History: Family History  Problem Relation Age of Onset   Thyroid disease Sister    Colon cancer Neg Hx    Colon polyps Neg Hx    Breast cancer Neg Hx    Prostate cancer Neg Hx    Pancreatic cancer Neg Hx     Social History:  reports that he has been smoking cigarettes. He has a 25.00 pack-year smoking history. He has never used smokeless tobacco. He reports that he does not currently use alcohol. He reports that he does not currently use drugs after having used the following drugs: Marijuana and Cocaine.  ROS: All other review of systems were reviewed and are negative except what is noted above in HPI  Physical Exam: BP (!) 165/84 (BP Location: Right Arm)   Pulse 67   Temp 98.7 F (37.1 C)   Ht $R'5\' 6"'Hc$  (1.676 m)   Wt 200 lb (90.7 kg)   BMI 32.28 kg/m   Constitutional:  Alert and oriented, No acute distress. HEENT:  AT, moist mucus membranes.  Trachea midline, no masses. Cardiovascular: No clubbing, cyanosis, or edema. Respiratory: Normal respiratory effort, no increased work of breathing. GI: Abdomen is soft, nontender, nondistended, no abdominal masses GU: No CVA tenderness.  Lymph: No cervical or inguinal lymphadenopathy. Skin: No rashes, bruises or suspicious lesions. Neurologic: Grossly intact, no focal deficits, moving all 4 extremities. Psychiatric: Normal mood and affect.  Laboratory Data: Lab Results  Component Value Date   WBC 7.8 02/13/2021   HGB 12.7 (L) 02/13/2021   HCT 37.0 (L) 02/13/2021   MCV 91.8 02/13/2021   PLT 124 (L) 02/13/2021    Lab Results  Component Value Date   CREATININE 1.25 03/24/2021    No results found for: PSA  No results found for: TESTOSTERONE  Lab Results  Component Value Date   HGBA1C 5.8 (H) 01/30/2021    Urinalysis    Component Value Date/Time   COLORURINE YELLOW 02/02/2021 2053   APPEARANCEUR CLEAR 02/02/2021 2053   APPEARANCEUR Clear 08/29/2020 1313   LABSPEC 1.012 02/02/2021 2053   PHURINE  6.0 02/02/2021 2053   GLUCOSEU NEGATIVE 02/02/2021 2053   HGBUR MODERATE (A) 02/02/2021 2053   BILIRUBINUR NEGATIVE 02/02/2021 2053   BILIRUBINUR Negative 08/29/2020 1313   KETONESUR NEGATIVE 02/02/2021 2053   PROTEINUR >=300 (A) 02/02/2021 2053   UROBILINOGEN 0.2 07/01/2014 2244   NITRITE NEGATIVE 02/02/2021 2053   LEUKOCYTESUR NEGATIVE 02/02/2021 2053    Lab Results  Component Value Date   LABMICR See below: 08/29/2020   WBCUA None seen 08/29/2020   LABEPIT None seen 08/29/2020   MUCUS Present 08/29/2020   BACTERIA NONE SEEN 02/02/2021  Pertinent Imaging:  No results found for this or any previous visit.  No results found for this or any previous visit.  No results found for this or any previous visit.  No results found for this or any previous visit.  No results found for this or any previous visit.  No results found for this or any previous visit.  No results found for this or any previous visit.  No results found for this or any previous visit.   Assessment & Plan:    1. Prostate cancer ([place]) -Schedule for fiducial and [facility] - CT Abdomen Pelvis W Wo Contrast - NM Bone Scan Whole Body   No follow-ups on file.  Nicolette Bang, MD  [facility] [place]

## 2021-04-05 NOTE — Progress Notes (Signed)

## 2021-04-13 ENCOUNTER — Emergency Department (HOSPITAL_COMMUNITY)
Admission: EM | Admit: 2021-04-13 | Discharge: 2021-04-13 | Disposition: A | Discharge status: Home / Self Care | Payer: Medicare Other | Attending: Emergency Medicine | Admitting: Emergency Medicine

## 2021-04-13 ENCOUNTER — Other Ambulatory Visit: Payer: Self-pay

## 2021-04-13 ENCOUNTER — Encounter (HOSPITAL_COMMUNITY): Payer: Self-pay | Admitting: *Deleted

## 2021-04-13 DIAGNOSIS — K0889 Other specified disorders of teeth and supporting structures: Secondary | ICD-10-CM | POA: Insufficient documentation

## 2021-04-13 DIAGNOSIS — Z5321 Procedure and treatment not carried out due to patient leaving prior to being seen by health care provider: Secondary | ICD-10-CM | POA: Insufficient documentation

## 2021-04-13 NOTE — ED Triage Notes (Signed)
Pt c/o left lower dental pain that started today. He can't get into his dentist until tomorrow and they referred him to the ED.

## 2021-04-17 NOTE — Patient Instructions (Signed)
Your procedure is scheduled on: 04/20/2021  Report to [facility] at   9:00  AM.  Call this number if you have problems the morning of surgery: [phone]   Remember:   Do not Eat or Drink after midnight         No Smoking the morning of surgery  :  Take these medicines the morning of surgery with A SIP OF WATER: Amlodipine, Klonopin, Hydroxyzine, pantoprazole, zoloft,  and alprazolam   Do not wear jewelry, make-up or nail polish.  Do not wear lotions, powders, or perfumes. You may wear deodorant.  Do not shave 48 hours prior to surgery. Men may shave face and neck.  Do not bring valuables to the hospital.  Contacts, dentures or bridgework may not be worn into surgery.  Leave suitcase in the car. After surgery it may be brought to your room.  For patients admitted to the hospital, checkout time is 11:00 AM the day of discharge.   Patients discharged the day of surgery will not be allowed to drive home.    Special Instructions: Shower using CHG night before surgery and shower the day of surgery use CHG.  Use special wash - you have one bottle of CHG for all showers.  You should use approximately 1/2 of the bottle for each shower.  How to Use Chlorhexidine for Bathing Chlorhexidine gluconate (CHG) is a germ-killing (antiseptic) solution that is used to clean the skin. It can get rid of the bacteria that normally live on the skin and can keep them away for about 24 hours. To clean your skin with CHG, you may be given: A CHG solution to use in the shower or as part of a sponge bath. A prepackaged cloth that contains CHG. Cleaning your skin with CHG may help lower the risk for infection: While you are staying in the intensive care unit of the hospital. If you have a vascular access, such as a central line, to provide short-term or long-term access to your veins. If you have a catheter to drain urine from your bladder. If you are on a ventilator. A ventilator is a machine that  helps you breathe by moving air in and out of your lungs. After surgery. What are the risks? Risks of using CHG include: A skin reaction. Hearing loss, if CHG gets in your ears and you have a perforated eardrum. Eye injury, if CHG gets in your eyes and is not rinsed out. The CHG product catching fire. Make sure that you avoid smoking and flames after applying CHG to your skin. Do not use CHG: If you have a chlorhexidine allergy or have previously reacted to chlorhexidine. On babies younger than 13 months of age. How to use CHG solution Use CHG only as told by your health care provider, and follow the instructions on the label. Use the full amount of CHG as directed. Usually, this is one bottle. During a shower Follow these steps when using CHG solution during a shower (unless your health care provider gives you different instructions): Start the shower. Use your normal soap and shampoo to wash your face and hair. Turn off the shower or move out of the shower stream. Pour the CHG onto a clean washcloth. Do not use any type of brush or rough-edged sponge. Starting at your neck, lather your body down to your toes. Make sure you follow these instructions: If you will be having surgery, pay special attention to the part of your body  where you will be having surgery. Scrub this area for at least 1 minute. Do not use CHG on your head or face. If the solution gets into your ears or eyes, rinse them well with water. Avoid your genital area. Avoid any areas of skin that have broken skin, cuts, or scrapes. Scrub your back and under your arms. Make sure to wash skin folds. Let the lather sit on your skin for 1-2 minutes or as long as told by your health care provider. Thoroughly rinse your entire body in the shower. Make sure that all body creases and crevices are rinsed well. Dry off with a clean towel. Do not put any substances on your body afterward--such as powder, lotion, or perfume--unless you  are told to do so by your health care provider. Only use lotions that are recommended by the manufacturer. Put on clean clothes or pajamas. If it is the night before your surgery, sleep in clean sheets.  During a sponge bath Follow these steps when using CHG solution during a sponge bath (unless your health care provider gives you different instructions): Use your normal soap and shampoo to wash your face and hair. Pour the CHG onto a clean washcloth. Starting at your neck, lather your body down to your toes. Make sure you follow these instructions: If you will be having surgery, pay special attention to the part of your body where you will be having surgery. Scrub this area for at least 1 minute. Do not use CHG on your head or face. If the solution gets into your ears or eyes, rinse them well with water. Avoid your genital area. Avoid any areas of skin that have broken skin, cuts, or scrapes. Scrub your back and under your arms. Make sure to wash skin folds. Let the lather sit on your skin for 1-2 minutes or as long as told by your health care provider. Using a different clean, wet washcloth, thoroughly rinse your entire body. Make sure that all body creases and crevices are rinsed well. Dry off with a clean towel. Do not put any substances on your body afterward--such as powder, lotion, or perfume--unless you are told to do so by your health care provider. Only use lotions that are recommended by the manufacturer. Put on clean clothes or pajamas. If it is the night before your surgery, sleep in clean sheets. How to use CHG prepackaged cloths Only use CHG cloths as told by your health care provider, and follow the instructions on the label. Use the CHG cloth on clean, dry skin. Do not use the CHG cloth on your head or face unless your health care provider tells you to. When washing with the CHG cloth: Avoid your genital area. Avoid any areas of skin that have broken skin, cuts, or  scrapes. Before surgery Follow these steps when using a CHG cloth to clean before surgery (unless your health care provider gives you different instructions): Using the CHG cloth, vigorously scrub the part of your body where you will be having surgery. Scrub using a back-and-forth motion for 3 minutes. The area on your body should be completely wet with CHG when you are done scrubbing. Do not rinse. Discard the cloth and let the area air-dry. Do not put any substances on the area afterward, such as powder, lotion, or perfume. Put on clean clothes or pajamas. If it is the night before your surgery, sleep in clean sheets.  For general bathing Follow these steps when using CHG cloths for  general bathing (unless your health care provider gives you different instructions). Use a separate CHG cloth for each area of your body. Make sure you wash between any folds of skin and between your fingers and toes. Wash your body in the following order, switching to a new cloth after each step: The front of your neck, shoulders, and chest. Both of your arms, under your arms, and your hands. Your stomach and groin area, avoiding the genitals. Your right leg and foot. Your left leg and foot. The back of your neck, your back, and your buttocks. Do not rinse. Discard the cloth and let the area air-dry. Do not put any substances on your body afterward--such as powder, lotion, or perfume--unless you are told to do so by your health care provider. Only use lotions that are recommended by the manufacturer. Put on clean clothes or pajamas. Contact a health care provider if: Your skin gets irritated after scrubbing. You have questions about using your solution or cloth. You swallow any chlorhexidine. Call your local poison control center (1-437-157-5223 in the [place] Get help right away if: Your eyes itch badly, or they become very red or swollen. Your skin itches badly and is red or swollen. Your hearing  changes. You have trouble seeing. You have swelling or tingling in your mouth or throat. You have trouble breathing. These symptoms may represent a serious problem that is an emergency. Do not wait to see if the symptoms will go away. Get medical help right away. Call your local emergency services (911 in the [place] Do not drive yourself to the hospital. Summary Chlorhexidine gluconate (CHG) is a germ-killing (antiseptic) solution that is used to clean the skin. Cleaning your skin with CHG may help to lower your risk for infection. You may be given CHG to use for bathing. It may be in a bottle or in a prepackaged cloth to use on your skin. Carefully follow your health care provider's instructions and the instructions on the product label. Do not use CHG if you have a chlorhexidine allergy. Contact your health care provider if your skin gets irritated after scrubbing. This information is not intended to replace advice given to you by your health care provider. Make sure you discuss any questions you have with your health care provider. Document Revised: 08/29/2020 Document Reviewed: 08/29/2020 Elsevier Patient Education  2022 [place]. Transrectal Ultrasound-Guided Prostate Gold Seed Placement, Care After The following information offers guidance on how to care for yourself after your procedure. Your health care provider may also give you more specific instructions. If you have problems or questions, contact your health care provider. What can I expect after the procedure? After the procedure, it is common to have: Light bleeding from the rectum. Bruising or tenderness in the area behind the scrotum (perineum), if the needle was put into your prostate through this area. Small amounts of blood in your urine. This should only last for a few days. Light brown or red semen. This may last for a couple of weeks. Follow these instructions at home: Medicines  Take over-the-counter and  prescription medicines only as told by your health care provider. If you were prescribed an antibiotic, take it as told by your health care provider. Do not stop taking the antibiotic early, even if you start to feel better. Ask your health care provider if the medicine prescribed to you requires you to avoid driving or using machinery. Eating and drinking Follow instructions from your health care provider about eating  or drinking restrictions. Drink enough fluid to keep your urine pale yellow. Managing pain and swelling If directed, put ice on the affected area. To do this: Put ice in a plastic bag. Place a towel between your skin and the bag. Leave the ice on for 20 minutes, 2-3 times a day. Be sure to remove the ice if your skin turns bright red. If you cannot feel pain, heat, or cold, you have a greater risk of damage to the area. Try not to sit directly on the area behind the scrotum. A soft cushion can help with discomfort. Activity If you were given a sedative during the procedure, it can affect you for several hours. Do not drive or operate machinery until your health care provider says that it is safe. Return to your normal activities as told by your health care provider. Ask your health care provider what activities are safe for you. Follow instructions from your health care provider about when it is safe for you to engage in sexual activity. General instructions Plan to have a responsible adult care for you for the time you are told after you leave the hospital or clinic. Do not take baths, swim, or use a hot tub until your health care provider approves. Ask your health care provider if you may take showers. You may only be allowed to take sponge baths. Keep all follow-up visits. Contact a health care provider if: You have a fever or chills. You have more blood in your urine. You have blood in your urine for more than 2-3 days after the procedure. You have trouble passing urine or  having a bowel movement. You have pain or burning when urinating. You have nausea or you vomit. Get help right away if: You have severe pain that does not get better with medicine. Your urine is bright red. You cannot urinate. You have rectal bleeding that gets worse. You have shortness of breath. Summary After the procedure, you may have blood in your urine and light bleeding from the rectum. Return to your normal activities as told by your health care provider. Ask your health care provider what activities are safe for you. Take over-the-counter and prescription medicines only as told by your health care provider. Contact your health care provider right away if your urine is bright red or you cannot pass urine. This information is not intended to replace advice given to you by your health care provider. Make sure you discuss any questions you have with your health care provider. Document Revised: 09/14/2020 Document Reviewed: 09/14/2020 Elsevier Patient Education  [place] Anesthesia, Adult, Care After This sheet gives you information about how to care for yourself after your procedure. Your health care provider may also give you more specific instructions. If you have problems or questions, contact your health care provider. What can I expect after the procedure? After the procedure, the following side effects are common: Pain or discomfort at the IV site. Nausea. Vomiting. Sore throat. Trouble concentrating. Feeling cold or chills. Feeling weak or tired. Sleepiness and fatigue. Soreness and body aches. These side effects can affect parts of the body that were not involved in surgery. Follow these instructions at home: For the time period you were told by your health care provider:  Rest. Do not participate in activities where you could fall or become injured. Do not drive or use machinery. Do not drink alcohol. Do not take sleeping pills or medicines that  cause drowsiness. Do not make  important decisions or sign legal documents. Do not take care of children on your own. Eating and drinking Follow any instructions from your health care provider about eating or drinking restrictions. When you feel hungry, start by eating small amounts of foods that are soft and easy to digest (bland), such as toast. Gradually return to your regular diet. Drink enough fluid to keep your urine pale yellow. If you vomit, rehydrate by drinking water, juice, or clear broth. General instructions If you have sleep apnea, surgery and certain medicines can increase your risk for breathing problems. Follow instructions from your health care provider about wearing your sleep device: Anytime you are sleeping, including during daytime naps. While taking prescription pain medicines, sleeping medicines, or medicines that make you drowsy. Have a responsible adult stay with you for the time you are told. It is important to have someone help care for you until you are awake and alert. Return to your normal activities as told by your health care provider. Ask your health care provider what activities are safe for you. Take over-the-counter and prescription medicines only as told by your health care provider. If you smoke, do not smoke without supervision. Keep all follow-up visits as told by your health care provider. This is important. Contact a health care provider if: You have nausea or vomiting that does not get better with medicine. You cannot eat or drink without vomiting. You have pain that does not get better with medicine. You are unable to pass urine. You develop a skin rash. You have a fever. You have redness around your IV site that gets worse. Get help right away if: You have difficulty breathing. You have chest pain. You have blood in your urine or stool, or you vomit blood. Summary After the procedure, it is common to have a sore throat or nausea. It is also  common to feel tired. Have a responsible adult stay with you for the time you are told. It is important to have someone help care for you until you are awake and alert. When you feel hungry, start by eating small amounts of foods that are soft and easy to digest (bland), such as toast. Gradually return to your regular diet. Drink enough fluid to keep your urine pale yellow. Return to your normal activities as told by your health care provider. Ask your health care provider what activities are safe for you. This information is not intended to replace advice given to you by your health care provider. Make sure you discuss any questions you have with your health care provider. Document Revised: 03/03/2020 Document Reviewed: 10/01/2019 Elsevier Patient Education  2022 [employer].

## 2021-04-18 ENCOUNTER — Encounter: Payer: Self-pay | Admitting: Urology

## 2021-04-18 ENCOUNTER — Encounter (HOSPITAL_COMMUNITY)
Admission: RE | Admit: 2021-04-18 | Discharge: 2021-04-18 | Disposition: A | Discharge status: Home / Self Care | Payer: Medicare Other | Source: Ambulatory Visit | Attending: Urology | Admitting: Urology

## 2021-04-18 ENCOUNTER — Other Ambulatory Visit: Payer: Self-pay

## 2021-04-18 NOTE — Patient Instructions (Signed)
Prostate Cancer °The prostate is a small gland that helps make semen. It is located below a man's bladder, in front of the rectum. Prostate cancer is when abnormal cells grow in this gland. °What are the causes? °The cause of this condition is not known. °What increases the risk? °Being age 65 or older. °Having a family history of prostate cancer. °Having a family history of cancer of the breasts or ovaries. °Having genes that are passed from parent to child (inherited). °Having Lynch syndrome. °African American men and men of African descent are diagnosed with prostate cancer at higher rates than other men. °What are the signs or symptoms? °Problems peeing (urinating). This may include: °A stream that is weak, or pee that stops and starts. °Trouble starting or stopping your pee. °Trouble emptying all of your pee. °Needing to pee more often, especially at night. °Blood in your pee or semen. °Pain in the: °Lower back. °Lower belly (abdomen). °Hips. °Trouble getting an erection. °Weakness or numbness in the legs or feet. °How is this treated? °Treatment for this condition depends on: °How much the cancer has spread. °Your age. °The kind of treatment you want. °Your health. °Treatments include: °Being watched. This is called observation. You will be tested from time to time, but you will not get treated. Tests are to make sure that the cancer is not growing. °Surgery. This may be done to: °Take out (remove) the prostate. °Freeze and kill cancer cells. °Radiation. This uses a strong beam of energy to kill cancer cells. °Chemotherapy. This uses medicines that stop cancer cells from increasing. This kills cancer cells and healthy cells. °Targeted therapy. This kills cancer cells only. Healthy cells are not affected. °Hormone treatment. This stops the body from making hormones that help the cancer cells grow. °Follow these instructions at home: °Lifestyle °Do not smoke or use any products that contain nicotine or tobacco.  If you need help quitting, ask your doctor. °Eat a healthy diet. °Treatment may affect your ability to have sex. If you have a partner, touch, hold, hug, and caress your partner to have intimate moments. °Get plenty of sleep. °Ask your doctor for help to find a support group for men with prostate cancer. °General instructions °Take over-the-counter and prescription medicines only as told by your doctor. °If you have to go to the hospital, let your cancer doctor (oncologist) know. °Keep all follow-up visits. °Where to find more information °American Cancer Society: www.cancer.org °American Society of Clinical Oncology: www.cancer.net °National Cancer Institute: www.cancer.gov °Contact a doctor if: °You have new or more trouble peeing. °You have new or more blood in your pee. °You have new or more pain in your hips, back, or chest. °Get help right away if: °You have weakness in your legs. °You lose feeling in your legs. °You cannot control your pee or your poop (stool). °You have chills or a fever. °Summary °The prostate is a male gland that helps make semen. °Prostate cancer is when abnormal cells grow in this gland. °Treatment includes doing surgery, using medicines, using strong beams of energy, or watching without treatment. °Ask your doctor for help to find a support group for men with prostate cancer. °Contact a doctor if you have problems peeing or have any new pain that you did not have before. °This information is not intended to replace advice given to you by your health care provider. Make sure you discuss any questions you have with your health care provider. °Document Revised: 09/14/2020 Document Reviewed: 09/14/2020 °Elsevier   Patient Education © 2022 Elsevier Inc. ° °

## 2021-04-20 ENCOUNTER — Ambulatory Visit (HOSPITAL_COMMUNITY)
Admission: RE | Admit: 2021-04-20 | Discharge: 2021-04-20 | Disposition: A | Discharge status: Home / Self Care | Payer: Medicare Other | Source: Ambulatory Visit | Attending: Urology | Admitting: Urology

## 2021-04-20 ENCOUNTER — Encounter (HOSPITAL_COMMUNITY)
Admission: RE | Disposition: A | Discharge status: Home / Self Care | Payer: Self-pay | Source: Home / Self Care | Attending: Urology

## 2021-04-20 ENCOUNTER — Encounter (HOSPITAL_COMMUNITY): Payer: Self-pay | Admitting: Urology

## 2021-04-20 ENCOUNTER — Other Ambulatory Visit (HOSPITAL_COMMUNITY): Payer: Self-pay | Admitting: Urology

## 2021-04-20 ENCOUNTER — Ambulatory Visit (HOSPITAL_COMMUNITY): Payer: Medicare Other | Admitting: Anesthesiology

## 2021-04-20 ENCOUNTER — Ambulatory Visit (HOSPITAL_COMMUNITY)
Admission: RE | Admit: 2021-04-20 | Discharge: 2021-04-20 | Disposition: A | Discharge status: Home / Self Care | Payer: Medicare Other | Attending: Urology | Admitting: Urology

## 2021-04-20 DIAGNOSIS — Z7984 Long term (current) use of oral hypoglycemic drugs: Secondary | ICD-10-CM | POA: Diagnosis not present

## 2021-04-20 DIAGNOSIS — R972 Elevated prostate specific antigen [PSA]: Secondary | ICD-10-CM

## 2021-04-20 DIAGNOSIS — F17210 Nicotine dependence, cigarettes, uncomplicated: Secondary | ICD-10-CM | POA: Insufficient documentation

## 2021-04-20 DIAGNOSIS — C61 Malignant neoplasm of prostate: Secondary | ICD-10-CM

## 2021-04-20 DIAGNOSIS — E119 Type 2 diabetes mellitus without complications: Secondary | ICD-10-CM | POA: Diagnosis not present

## 2021-04-20 DIAGNOSIS — R569 Unspecified convulsions: Secondary | ICD-10-CM | POA: Diagnosis not present

## 2021-04-20 HISTORY — PX: SPACE OAR INSTILLATION: SHX6769

## 2021-04-20 HISTORY — PX: GOLD SEED IMPLANT: SHX6343

## 2021-04-20 LAB — GLUCOSE, CAPILLARY: Glucose-Capillary: 116 mg/dL — ABNORMAL HIGH (ref 70–99)

## 2021-04-20 SURGERY — INSERTION, GOLD SEEDS
Anesthesia: General

## 2021-04-20 MED ORDER — CEFAZOLIN SODIUM-DEXTROSE 2-4 GM/100ML-% IV SOLN
INTRAVENOUS | Status: AC
  Filled 2021-04-20: qty 100

## 2021-04-20 MED ORDER — TRAMADOL HCL 50 MG PO TABS: 50.0000 mg | ORAL_TABLET | Freq: Four times a day (QID) | ORAL | 0 refills | Status: AC | PRN

## 2021-04-20 MED ORDER — ORAL CARE MOUTH RINSE: 15.0000 mL | Freq: Once | OROMUCOSAL | Status: AC

## 2021-04-20 MED ORDER — ONDANSETRON HCL 4 MG/2ML IJ SOLN
INTRAMUSCULAR | Status: DC | PRN
  Administered 2021-04-20: 4 mg via INTRAVENOUS

## 2021-04-20 MED ORDER — LIDOCAINE HCL (PF) 2 % IJ SOLN
INTRAMUSCULAR | Status: AC
  Filled 2021-04-20: qty 15

## 2021-04-20 MED ORDER — LIDOCAINE 2% (20 MG/ML) 5 ML SYRINGE
INTRAMUSCULAR | Status: DC | PRN
  Administered 2021-04-20: 80 mg via INTRAVENOUS

## 2021-04-20 MED ORDER — PHENYLEPHRINE 40 MCG/ML (10ML) SYRINGE FOR IV PUSH (FOR BLOOD PRESSURE SUPPORT)
PREFILLED_SYRINGE | INTRAVENOUS | Status: DC | PRN
  Administered 2021-04-20: 80 ug via INTRAVENOUS

## 2021-04-20 MED ORDER — DEXAMETHASONE SODIUM PHOSPHATE 10 MG/ML IJ SOLN
INTRAMUSCULAR | Status: DC | PRN
  Administered 2021-04-20: 5 mg via INTRAVENOUS

## 2021-04-20 MED ORDER — PHENYLEPHRINE 40 MCG/ML (10ML) SYRINGE FOR IV PUSH (FOR BLOOD PRESSURE SUPPORT)
PREFILLED_SYRINGE | INTRAVENOUS | Status: AC
  Filled 2021-04-20: qty 10

## 2021-04-20 MED ORDER — PROPOFOL 10 MG/ML IV BOLUS
INTRAVENOUS | Status: DC | PRN
  Administered 2021-04-20: 200 mg via INTRAVENOUS

## 2021-04-20 MED ORDER — LACTATED RINGERS IV SOLN
INTRAVENOUS | Status: DC
  Administered 2021-04-20: 1000 mL via INTRAVENOUS

## 2021-04-20 MED ORDER — CHLORHEXIDINE GLUCONATE 0.12 % MT SOLN
15.0000 mL | Freq: Once | OROMUCOSAL | Status: AC
  Administered 2021-04-20: 15 mL via OROMUCOSAL

## 2021-04-20 MED ORDER — PROPOFOL 10 MG/ML IV BOLUS
INTRAVENOUS | Status: AC
  Filled 2021-04-20: qty 20

## 2021-04-20 MED ORDER — CEFAZOLIN SODIUM-DEXTROSE 2-4 GM/100ML-% IV SOLN
2.0000 g | INTRAVENOUS | Status: AC
  Administered 2021-04-20: 2 g via INTRAVENOUS

## 2021-04-20 MED ORDER — FENTANYL CITRATE PF 50 MCG/ML IJ SOSY: 25.0000 ug | PREFILLED_SYRINGE | INTRAMUSCULAR | Status: DC | PRN

## 2021-04-20 MED ORDER — DEXAMETHASONE SODIUM PHOSPHATE 10 MG/ML IJ SOLN
INTRAMUSCULAR | Status: AC
  Filled 2021-04-20: qty 1

## 2021-04-20 MED ORDER — FENTANYL CITRATE (PF) 100 MCG/2ML IJ SOLN
INTRAMUSCULAR | Status: DC | PRN
  Administered 2021-04-20: 25 ug via INTRAVENOUS

## 2021-04-20 MED ORDER — ONDANSETRON HCL 4 MG/2ML IJ SOLN: 4.0000 mg | Freq: Once | INTRAMUSCULAR | Status: DC | PRN

## 2021-04-20 MED ORDER — FENTANYL CITRATE (PF) 100 MCG/2ML IJ SOLN
INTRAMUSCULAR | Status: AC
  Filled 2021-04-20: qty 2

## 2021-04-20 MED ORDER — 0.9 % SODIUM CHLORIDE (POUR BTL) OPTIME
TOPICAL | Status: DC | PRN
  Administered 2021-04-20: 1000 mL

## 2021-04-20 SURGICAL SUPPLY — 24 items
COVER BACK TABLE 60X90IN (DRAPES) ×2 IMPLANT
DRAPE LEGGINS SURG 28X43 STRL (DRAPES) ×2 IMPLANT
DRAPE SURG 17X23 STRL (DRAPES) ×2 IMPLANT
DRSG TEGADERM 8X12 (GAUZE/BANDAGES/DRESSINGS) ×2 IMPLANT
GAUZE SPONGE 4X4 12PLY STRL (GAUZE/BANDAGES/DRESSINGS) ×2 IMPLANT
GLOVE SRG 8 PF TXTR STRL LF DI (GLOVE) ×1 IMPLANT
GLOVE SURG POLYISO LF SZ8 (GLOVE) ×2 IMPLANT
GLOVE SURG UNDER POLY LF SZ7 (GLOVE) ×2 IMPLANT
GLOVE SURG UNDER POLY LF SZ7.5 (GLOVE) ×2 IMPLANT
GLOVE SURG UNDER POLY LF SZ8 (GLOVE) ×2
GOWN STRL REUS W/TWL LRG LVL3 (GOWN DISPOSABLE) ×2 IMPLANT
GOWN STRL REUS W/TWL XL LVL3 (GOWN DISPOSABLE) ×2 IMPLANT
IMPL SPACEOAR SYSTEM 10ML (Spacer) ×1 IMPLANT
IMPLANT SPACEOAR SYSTEM 10ML (Spacer) ×2 IMPLANT
KIT TURNOVER CYSTO (KITS) ×2 IMPLANT
MARKER GOLD PRELOAD 1.2X3 (Urological Implant) ×1 IMPLANT
NS IRRIG 1000ML POUR BTL (IV SOLUTION) ×2 IMPLANT
PAD ARMBOARD 7.5X6 YLW CONV (MISCELLANEOUS) ×4 IMPLANT
SEED GOLD PRELOAD 1.2X3 (Urological Implant) ×2 IMPLANT
SURGILUBE 2OZ TUBE FLIPTOP (MISCELLANEOUS) ×2 IMPLANT
SYR CONTROL 10ML LL (SYRINGE) ×2 IMPLANT
TOWEL NATURAL 4PK STERILE (DISPOSABLE) ×2 IMPLANT
UNDERPAD 30X36 HEAVY ABSORB (UNDERPADS AND DIAPERS) ×2 IMPLANT
WATER STERILE IRR 500ML POUR (IV SOLUTION) ×2 IMPLANT

## 2021-04-20 NOTE — Op Note (Signed)
PRE-OPERATIVE DIAGNOSIS:  Adenocarcinoma of the prostate  POST-OPERATIVE DIAGNOSIS:  Same  PROCEDURE: 1. Prostate Ultrasound 2. Placement of fiducial marks 3. Placement of SpaceOAR  SURGEON:  Surgeon(s): Nicolette Bang, MD  ANESTHESIA:  General  EBL:  Minimal  DRAINS: none  FINDINGS: Prostate volume 54.8cc  INDICATION: Juan Juarez is a 65 year old with a history of T1c prostate cancer who is scheduled to undergo IMRT. He wishes to have fiducial markers and SpaceOAR placed prior to IMRT to decrease rectal toxicity.  Description of procedure: After informed consent the patient was brought to the major OR, placed on the table and administered general anesthesia. He was then moved to the modified lithotomy position with his perineum perpendicular to the floor. His perineum and genitalia were then sterilely prepped. An official timeout was then performed. The transrectal ultrasound probe was placed in the rectum and affixed to the stand. He was then sterilely draped.  A transrectal ultrasound of the prostate was performed.  Lidocaine  was not  instilled using ultrasound guidance into the junction of each seminal vesicle of the prostate.  3 Gold markers were placed into the prostate using the standard template and ultrasound guidance.  Accurate placement of the markers was confirmed.  We then proceeded to mix the SpaceOAR using the kit supplied from the manufacturer. Once this was complete we placed a sinal needle into the perirectal fat between the rectum and the prostate. Once this was accomplished we injected 2cc of normal saline to hydrodissect the plain. We then instilled the the SpaceOAR through the spinal needle and noted good distribution in the perirectal fat.   The patient was awakened and taken to recovery room in stable and satisfactory condition. He tolerated procedure well and there were no intraoperative complications.  CONDITION: Stable, extubated, transferred to  PACU  PLAN: The patient is to be discharged home and he will start IMRT in the next 2-3 weeks

## 2021-04-20 NOTE — Interval H&P Note (Signed)
History and Physical Interval Note:  04/20/2021 9:41 AM  Juan Juarez  has presented today for surgery, with the diagnosis of prostate cancer.  The various methods of treatment have been discussed with the patient and family. After consideration of risks, benefits and other options for treatment, the patient has consented to  Procedure(s): GOLD SEED IMPLANT (N/A) SPACE OAR INSTILLATION (N/A) as a surgical intervention.  The patient's history has been reviewed, patient examined, no change in status, stable for surgery.  I have reviewed the patient's chart and labs.  Questions were answered to the patient's satisfaction.     Nicolette Bang

## 2021-04-20 NOTE — Transfer of Care (Signed)
Immediate Anesthesia Transfer of Care Note  Patient: Juan Juarez  Procedure(s) Performed: GOLD SEED IMPLANT SPACE OAR INSTILLATION  Patient Location: PACU  Anesthesia Type:General  Level of Consciousness: awake, alert , oriented and patient cooperative  Airway & Oxygen Therapy: Patient Spontanous Breathing and Patient connected to nasal cannula oxygen  Post-op Assessment: Report given to RN, Post -op Vital signs reviewed and stable and Patient moving all extremities  Post vital signs: Reviewed and stable  Last Vitals:  Vitals Value Taken Time  BP    Temp    Pulse 60 04/20/21 1151  Resp 13 04/20/21 1151  SpO2 97 % 04/20/21 1151  Vitals shown include unvalidated device data.  Last Pain:  Vitals:   04/20/21 1019  TempSrc: Oral  PainSc: 9       Patients Stated Pain Goal: 8 (56/[id number])  Complications: No notable events documented.

## 2021-04-20 NOTE — Anesthesia Procedure Notes (Signed)
Procedure Name: LMA Insertion Date/Time: 04/20/2021 11:21 AM Performed by: Myna Bright, CRNA Pre-anesthesia Checklist: Patient identified, Emergency Drugs available, Suction available and Patient being monitored Patient Re-evaluated:Patient Re-evaluated prior to induction Oxygen Delivery Method: Circle system utilized Preoxygenation: Pre-oxygenation with 100% oxygen Induction Type: IV induction LMA: LMA inserted LMA Size: 4.0 Tube type: Oral Number of attempts: 1 Placement Confirmation: positive ETCO2 and breath sounds checked- equal and bilateral Tube secured with: Tape Dental Injury: Teeth and Oropharynx as per pre-operative assessment

## 2021-04-20 NOTE — Anesthesia Postprocedure Evaluation (Signed)
Anesthesia Post Note  Patient: Juan Juarez  Procedure(s) Performed: GOLD SEED IMPLANT SPACE OAR INSTILLATION  Patient location during evaluation: PACU Anesthesia Type: General Level of consciousness: awake and alert and oriented Pain management: pain level controlled Vital Signs Assessment: post-procedure vital signs reviewed and stable Respiratory status: spontaneous breathing, nonlabored ventilation and respiratory function stable Cardiovascular status: blood pressure returned to baseline and stable Postop Assessment: no apparent nausea or vomiting Anesthetic complications: no   No notable events documented.   Last Vitals:  Vitals:   04/20/21 1200 04/20/21 1235  BP: 107/67 (!) 156/91  Pulse: 60 61  Resp: 13 16  Temp:  36.7 C  SpO2: 97% 98%    Last Pain:  Vitals:   04/20/21 1235  TempSrc: Oral  PainSc: 0-No pain                 Deondray Ospina C Renato Spellman

## 2021-04-20 NOTE — Anesthesia Preprocedure Evaluation (Addendum)
Anesthesia Evaluation  Patient identified by MRN, date of birth, ID band Patient awake    Reviewed: Allergy & Precautions, NPO status , Patient's Chart, lab work & pertinent test results  History of Anesthesia Complications (+) DIFFICULT AIRWAY and history of anesthetic complications  Airway Mallampati: II  TM Distance: >3 FB Neck ROM: Full   Comment: Difficult airway Cervical spondylosis  Dental  (+) Dental Advisory Given, Upper Dentures, Poor Dentition, Missing SLIGHTLY LOOSE TEETH:   Pulmonary Current Smoker and Patient abstained from smoking.,    Pulmonary exam normal breath sounds clear to auscultation       Cardiovascular hypertension, Pt. on medications Normal cardiovascular exam Rhythm:Regular Rate:Normal     Neuro/Psych Seizures -, Poorly Controlled,  PSYCHIATRIC DISORDERS Anxiety Depression Bipolar Disorder CVA (subdural hematoma, head injury)    GI/Hepatic GERD  Medicated and Controlled,(+)     substance abuse (last use 10 days ago)  cocaine use, Hepatitis -, C  Endo/Other  diabetes, Well Controlled, Type 2, Oral Hypoglycemic Agents  Renal/GU Renal disease   Prostate cancer    Musculoskeletal  (+) Arthritis ,   Abdominal   Peds  Hematology  (+) anemia ,   Anesthesia Other Findings Cervical spondylosis   Reproductive/Obstetrics                        Anesthesia Physical Anesthesia Plan  ASA: 3  Anesthesia Plan: General   Post-op Pain Management:    Induction: Intravenous  PONV Risk Score and Plan: 3 and Ondansetron  Airway Management Planned: LMA  Additional Equipment:   Intra-op Plan:   Post-operative Plan: Extubation in OR  Informed Consent: I have reviewed the patients History and Physical, chart, labs and discussed the procedure including the risks, benefits and alternatives for the proposed anesthesia with the patient or authorized representative who has  indicated his/her understanding and acceptance.     Dental advisory given  Plan Discussed with: CRNA and Surgeon  Anesthesia Plan Comments:        Anesthesia Quick Evaluation

## 2021-04-21 ENCOUNTER — Encounter (HOSPITAL_COMMUNITY): Payer: Self-pay | Admitting: Urology

## 2021-05-01 ENCOUNTER — Encounter (HOSPITAL_COMMUNITY): Payer: Medicare Other

## 2021-05-01 ENCOUNTER — Ambulatory Visit (HOSPITAL_COMMUNITY): Payer: Medicare Other

## 2021-05-05 ENCOUNTER — Ambulatory Visit: Admitting: Urology

## 2021-05-10 ENCOUNTER — Ambulatory Visit: Admitting: Urology

## 2021-05-12 ENCOUNTER — Emergency Department (HOSPITAL_COMMUNITY)
Admission: EM | Admit: 2021-05-12 | Discharge: 2021-05-12 | Disposition: A | Discharge status: Home / Self Care | Payer: Medicare Other | Attending: Emergency Medicine | Admitting: Emergency Medicine

## 2021-05-12 ENCOUNTER — Encounter (HOSPITAL_COMMUNITY): Payer: Self-pay | Admitting: *Deleted

## 2021-05-12 ENCOUNTER — Other Ambulatory Visit: Payer: Self-pay

## 2021-05-12 DIAGNOSIS — Z7984 Long term (current) use of oral hypoglycemic drugs: Secondary | ICD-10-CM | POA: Insufficient documentation

## 2021-05-12 DIAGNOSIS — F17210 Nicotine dependence, cigarettes, uncomplicated: Secondary | ICD-10-CM | POA: Diagnosis not present

## 2021-05-12 DIAGNOSIS — E119 Type 2 diabetes mellitus without complications: Secondary | ICD-10-CM | POA: Diagnosis not present

## 2021-05-12 DIAGNOSIS — C61 Malignant neoplasm of prostate: Secondary | ICD-10-CM

## 2021-05-12 DIAGNOSIS — Z8546 Personal history of malignant neoplasm of prostate: Secondary | ICD-10-CM | POA: Insufficient documentation

## 2021-05-12 DIAGNOSIS — Z79899 Other long term (current) drug therapy: Secondary | ICD-10-CM | POA: Diagnosis not present

## 2021-05-12 DIAGNOSIS — E860 Dehydration: Secondary | ICD-10-CM | POA: Insufficient documentation

## 2021-05-12 DIAGNOSIS — R300 Dysuria: Secondary | ICD-10-CM | POA: Diagnosis present

## 2021-05-12 DIAGNOSIS — I10 Essential (primary) hypertension: Secondary | ICD-10-CM | POA: Diagnosis not present

## 2021-05-12 LAB — CBC WITH DIFFERENTIAL/PLATELET
Abs Immature Granulocytes: 0.01 10*3/uL (ref 0.00–0.07)
Basophils Absolute: 0 10*3/uL (ref 0.0–0.1)
Basophils Relative: 1 %
Eosinophils Absolute: 0.1 10*3/uL (ref 0.0–0.5)
Eosinophils Relative: 1 %
HCT: 41.7 % (ref 39.0–52.0)
Hemoglobin: 14.8 g/dL (ref 13.0–17.0)
Immature Granulocytes: 0 %
Lymphocytes Relative: 33 %
Lymphs Abs: 2.6 10*3/uL (ref 0.7–4.0)
MCH: 32.2 pg (ref 26.0–34.0)
MCHC: 35.5 g/dL (ref 30.0–36.0)
MCV: 90.8 fL (ref 80.0–100.0)
Monocytes Absolute: 0.7 10*3/uL (ref 0.1–1.0)
Monocytes Relative: 9 %
Neutro Abs: 4.4 10*3/uL (ref 1.7–7.7)
Neutrophils Relative %: 56 %
Platelets: 97 10*3/uL — ABNORMAL LOW (ref 150–400)
RBC: 4.59 MIL/uL (ref 4.22–5.81)
RDW: 13.7 % (ref 11.5–15.5)
WBC: 7.8 10*3/uL (ref 4.0–10.5)
nRBC: 0 % (ref 0.0–0.2)

## 2021-05-12 LAB — BASIC METABOLIC PANEL
Anion gap: 6 (ref 5–15)
BUN: 26 mg/dL — ABNORMAL HIGH (ref 8–23)
CO2: 24 mmol/L (ref 22–32)
Calcium: 8.7 mg/dL — ABNORMAL LOW (ref 8.9–10.3)
Chloride: 106 mmol/L (ref 98–111)
Creatinine, Ser: 1.35 mg/dL — ABNORMAL HIGH (ref 0.61–1.24)
GFR, Estimated: 58 mL/min — ABNORMAL LOW (ref >60)
Glucose, Bld: 131 mg/dL — ABNORMAL HIGH (ref 70–99)
Potassium: 4.7 mmol/L (ref 3.5–5.1)
Sodium: 136 mmol/L (ref 135–145)

## 2021-05-12 MED ORDER — PHENAZOPYRIDINE HCL 200 MG PO TABS: 200.0000 mg | ORAL_TABLET | Freq: Three times a day (TID) | ORAL | 0 refills | Status: DC | PRN

## 2021-05-12 NOTE — ED Provider Notes (Signed)
[facility] EMERGENCY DEPARTMENT Provider Note   CSN: [id number] Arrival date & time: 05/12/21  2012     History Chief Complaint  Patient presents with   Urinary Retention    Juan Juarez is a 65 y.o. male.  HPI Patient presents with complaint of not being able to urinate for 48 hours.  He has been diagnosed with prostate cancer and currently plans are ineffective for radiation therapy to begin.  He has received pretreatment with SpaceOar gel to protect the rectum during radiation therapy.  He is currently incarcerated.  He has not received planned imaging with nuclear medicine bone scan whole body and CT abdomen pelvis which were ordered, on 04/05/2021.  Patient states he has painful urination, for several days.  He is drinking less fluid because he does not like to urinate.  He states he was supposed to follow-up for imaging on May 01, 2021.  He did not do that because he was incarcerated.  He [facility] been [facility].  Currently he is in city jail in [facility] and will be transferred to the state facility soon.  There are no other known active modifying factors.    Past Medical History:  Diagnosis Date   Anxiety    Chronic elbow pain, left    Depression    Diabetes mellitus, type II ([place])    type II   Gout    Head injury 12/2017   Hepatitis C without hepatic coma    Hypertension    denies   Inflammatory arthropathy    left elbow   Insomnia    Non compliance w medication regimen    Osteoarthritis    Polysubstance abuse ([place])    Prostate cancer ([place])    Protein-calorie malnutrition, severe ([place]) 09/13/2016   Seizure ([place]) 12/2017   after head Injury   Thrombocytopenia [facility])     Patient Active Problem List   Diagnosis Date Noted   Mild neurocognitive disorder 02/16/2021   Bipolar disorder, unspecified ([place]) 02/14/2021   History of traumatic brain injury 02/14/2021   AKI (acute kidney injury) ([place])    Acute  respiratory failure with hypoxia ([place]) 01/30/2021   Intentional overdose of drug in tablet form ([place]) 01/30/2021   Hypothermia 01/30/2021   Bradycardia 01/30/2021   Hyperlipidemia 01/30/2021   Suicidal ideation 01/30/2021   Seizure disorder ([place]) 01/30/2021   Malignant neoplasm of prostate ([place]) 10/18/2020   Pain 04/06/2019   Septic arthritis ([place]) 01/01/2019   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/05/2018   Hyperosmolar (nonketotic) coma ([place]) 09/02/2018   Type 2 diabetes mellitus with hyperglycemia, with long-term current use of insulin ([place]) 09/02/2018   Status epilepticus ([place]) 01/22/2018   Macrocytic anemia 01/22/2018   Seizure ([place]) 01/22/2018   Subdural hematoma 12/31/2017   Chronic hepatitis C without hepatic coma ([place]) 16/12/3708   Alcoholic cirrhosis of liver without ascites ([place]) 12/31/2017   Hyperammonemia ([place]) 11/09/2017   Diabetic hyperosmolar non-ketotic state ([place]) 11/07/2017   Osteoarthritis 11/07/2017   Tobacco abuse 11/07/2017   Hepatic cirrhosis ([place]) 10/23/2017   Splenomegaly    Hypoalbuminemia due to protein-calorie malnutrition ([place])    Cocaine abuse ([place]) 05/01/2017   Hyperglycemia due to diabetes mellitus ([place]) 09/13/2016   Polysubstance abuse ([place]) 09/13/2016   Elevated LFTs 09/13/2016   Protein-calorie malnutrition, severe ([place]) 09/13/2016   Depression 09/13/2016   Thrombocytopenia ([place]) 09/13/2016   Diabetes mellitus with hyperglycemia ([place]) 05/13/2015   Essential hypertension, benign 05/13/2015   Smoker 05/13/2015  Back pain at L4-L5 level 11/05/2013   Neck pain 11/05/2013   Cervical spondylosis 11/05/2013   Spinal stenosis of lumbar region 11/05/2013    Past Surgical History:  Procedure Laterality Date   BURR HOLE Right 01/22/2018   Procedure: RIGHT BURR HOLES;  Surgeon: Newman Pies, MD;  Location: [facility];  Service: Neurosurgery;  Laterality: Right;   GOLD SEED IMPLANT N/A 04/20/2021   Procedure: GOLD SEED IMPLANT;  Surgeon: Cleon Gustin, MD;  Location: [facility];  Service: Urology;  Laterality: N/A;   I & D EXTREMITY Left 11/06/2018   Procedure: IRRIGATION AND DEBRIDEMENT LEFT ELBOW JOINT;  Surgeon: Charlotte Crumb, MD;  Location: [facility];  Service: Orthopedics;  Laterality: Left;   INCISION AND DRAINAGE Left 10/24/2018   Procedure: INCISION AND DRAINAGE LEFT ELBOW WITH MASS EXCISION;  Surgeon: Charlotte Crumb, MD;  Location: [facility];  Service: Orthopedics;  Laterality: Left;   MULTIPLE TOOTH EXTRACTIONS     None     SPACE OAR INSTILLATION N/A 04/20/2021   Procedure: SPACE OAR INSTILLATION;  Surgeon: Cleon Gustin, MD;  Location: [facility];  Service: Urology;  Laterality: N/A;       Family History  Problem Relation Age of Onset   Thyroid disease Sister    Colon cancer Neg Hx    Colon polyps Neg Hx    Breast cancer Neg Hx    Prostate cancer Neg Hx    Pancreatic cancer Neg Hx     Social History   Tobacco Use   Smoking status: Every Day    Packs/day: 0.50    Years: 50.00    Pack years: 25.00    Types: Cigarettes   Smokeless tobacco: Never  Vaping Use   Vaping Use: Never used  Substance Use Topics   Alcohol use: Not Currently    Comment: quit 2019   Drug use: Not Currently    Types: Marijuana, Cocaine    Comment: cocaine last use 10/26/2018, Marijuana sping 2019    Home Medications Prior to Admission medications   Medication Sig Start Date End Date Taking? Authorizing Provider  phenazopyridine (PYRIDIUM) 200 MG tablet Take 1 tablet (200 mg total) by mouth 3 (three) times daily as needed (Painful urination). 05/12/21  Yes Daleen Bo, MD  amLODipine (NORVASC) 5 MG tablet Take 1 tablet (5 mg total) by mouth daily. For high blood pressure Patient not taking: No sig reported 02/24/21   Lindell Spar I, NP  bacitracin ointment Apply topically 2 (two) times daily. May buy from over the counter): For wound care Patient not taking: No sig reported 02/24/21   Lindell Spar I, NP  blood glucose meter kit and  supplies Dispense based on patient and insurance preference. Use up to four times daily as directed. (FOR ICD-10 E10.9, E11.9). 09/03/18   Johnson, Clanford L, MD  cloBAZam (ONFI) 10 MG tablet Take 10 mg by mouth daily.    [provider]  clonazePAM (KLONOPIN) 0.5 MG tablet Take 0.5 mg by mouth daily. 03/17/21   [provider]  doxepin (SINEQUAN) 75 MG capsule Take 75-150 mg by mouth at bedtime as needed (Sleep). 02/25/21   [provider]  EASY COMFORT INSULIN SYRINGE 31G X 5/16" 1 ML MISC USE AS DIRECTED ONCE DAILY FOR LEVEMIR Onycha 100 DAY SUPPLY 08/15/20   [provider]  glipiZIDE (GLUCOTROL XL) 5 MG 24 hr tablet Take 5 mg by mouth daily. 02/25/21   [provider]  hydrOXYzine (ATARAX/VISTARIL) 25 MG tablet Take  1 tablet (25 mg total) by mouth 3 (three) times daily as needed for anxiety. Patient taking differently: Take 25 mg by mouth daily. 02/24/21   Lindell Spar I, NP  metFORMIN (GLUCOPHAGE) 500 MG tablet Take 500 mg by mouth 2 (two) times daily. 02/25/21   [provider]  nicotine (NICODERM CQ - DOSED IN MG/24 HOURS) 21 mg/24hr patch Place 1 patch (21 mg total) onto the skin daily. (May buy from over the counter): For smoking cessation Patient not taking: No sig reported 02/24/21   Lindell Spar I, NP  pantoprazole (PROTONIX) 40 MG tablet Take 1 tablet (40 mg total) by mouth daily. For acid reflux Patient not taking: No sig reported 02/24/21   Lindell Spar I, NP  QUEtiapine (SEROQUEL) 100 MG tablet Take 100 mg by mouth at bedtime.    [provider]  QUEtiapine (SEROQUEL) 400 MG tablet Take 1 tablet (400 mg total) by mouth at bedtime. For mood control Patient taking differently: Take 200 mg by mouth at bedtime as needed (Sleep). For mood control 02/24/21   Lindell Spar I, NP  sertraline (ZOLOFT) 100 MG tablet Take 100 mg by mouth daily. 03/17/21   [provider]  traMADol (ULTRAM) 50 MG tablet Take 1 tablet (50 mg total)  by mouth every 6 (six) hours as needed. 04/20/21 04/20/22  Cleon Gustin, MD    Allergies    Patient has no known allergies.  Review of Systems   Review of Systems  All other systems reviewed and are negative.  Physical Exam Updated Vital Signs BP (!) 133/100 (BP Location: Left Arm)   Pulse 60   Temp 98.2 F (36.8 C) (Oral)   Resp 16   Ht $R'5\' 7"'DQ$  (1.702 m)   Wt 90.7 kg   SpO2 98%   BMI 31.32 kg/m   Physical Exam Vitals and nursing note reviewed.  Constitutional:      Appearance: He is well-developed. He is not ill-appearing.  HENT:     Head: Normocephalic and atraumatic.     Right Ear: External ear normal.     Left Ear: External ear normal.  Eyes:     Conjunctiva/sclera: Conjunctivae normal.     Pupils: Pupils are equal, round, and reactive to light.  Neck:     Trachea: Phonation normal.  Cardiovascular:     Rate and Rhythm: Normal rate.  Pulmonary:     Effort: Pulmonary effort is normal.  Abdominal:     General: There is no distension.     Tenderness: There is no abdominal tenderness.  Musculoskeletal:        General: Normal range of motion.     Cervical back: Normal range of motion and neck supple.  Skin:    General: Skin is warm and dry.  Neurological:     Mental Status: He is alert and oriented to person, place, and time.     Cranial Nerves: No cranial nerve deficit.     Sensory: No sensory deficit.     Motor: No abnormal muscle tone.     Coordination: Coordination normal.  Psychiatric:        Mood and Affect: Mood normal.        Behavior: Behavior normal.        Thought Content: Thought content normal.        Judgment: Judgment normal.    ED Results / Procedures / Treatments   Labs (all labs ordered are listed, but only abnormal results are displayed) Labs Reviewed  BASIC METABOLIC PANEL - Abnormal; Notable for the following components:      Result Value   Glucose, Bld 131 (*)    BUN 26 (*)    Creatinine, Ser 1.35 (*)    Calcium 8.7 (*)     GFR, Estimated 58 (*)    All other components within normal limits  CBC WITH DIFFERENTIAL/PLATELET - Abnormal; Notable for the following components:   Platelets 97 (*)    All other components within normal limits    EKG None  Radiology No results found.  Procedures Procedures   Medications Ordered in ED Medications - No data to display  ED Course  I have reviewed the triage vital signs and the nursing notes.  Pertinent labs & imaging results that were available during my care of the patient were reviewed by me and considered in my medical decision making (see chart for details).    MDM Rules/Calculators/A&P                            Patient Vitals for the past 24 hrs:  BP Temp Temp src Pulse Resp SpO2 Height Weight  05/12/21 2043 (!) 133/100 98.2 F (36.8 C) Oral 60 16 98 % -- --  05/12/21 2041 -- -- -- -- -- -- $Rem'5\' 7"'cnEZ$  (1.702 m) 90.7 kg    At the Time of discharge- reevaluation with update and discussion. After initial assessment and treatment, an updated evaluation reveals no further complaints, findings discussed and questions answered. Daleen Bo   Medical Decision Making:  This patient is presenting for evaluation of painful urination and decreased oral intake, which does require a range of treatment options, and is a complaint that involves a moderate risk of morbidity and mortality. The differential diagnoses include UTI, complications from prostate cancer, complications from recent surgical procedure. I decided to review old records, and in summary elderly male, being evaluated and treated by urology for prostate cancer.  He is unfortunately been incarcerated, and missed his scheduled appointments for imaging to stage cancer and arrange follow-up care.  He is now a ward of the state, incarcerated for at least a year according to the officer with him.  He will likely not we will proceed with his current plan but can proceed with care as an outpatient, through the  judicial system health management service..  I did not require additional historical information from anyone.  Clinical Laboratory Tests Ordered, included CBC and Metabolic panel. Review indicates normal except BUN high, creatinine high, calcium low, glucose high.   I ordered and Reviewed the urinary bladder scan medicine Test.  Less than 100 cc in the urinary bladder, no indication for urinary retention  Critical Interventions-clinical evaluation, laboratory testing, observation and reassessment  After These Interventions, the Patient was reevaluated and was found stable for discharge.  Urinary bladder scan, 82 mL.  This does not indicate urinary retention.  Screening labs, CBC and be met are normal.  Doubt UTI, urinary outlet obstruction, rapidly progressive prostate cancer.  He is stable for discharge with outpatient management as previously planned.  If this cannot be done locally, because of incarceration that will have to be done at the state facility.  I have filled out paperwork to document this as standard, using form provided by the Curator with the patient.  I have also written these instructions and the discharge paperwork  CRITICAL CARE-no Performed by: Daleen Bo  Nursing Notes Reviewed/ Care Coordinated  Applicable Imaging Reviewed Interpretation of Laboratory Data incorporated into ED treatment  The patient appears reasonably screened and/or stabilized for discharge and I doubt any other medical condition or other [facility] requiring further screening, evaluation, or treatment in the ED at this time prior to discharge.  Plan: Home Medications-continue usual medications; Home Treatments-rest, fluids, 2 L each day; return here if the recommended treatment, does not improve the symptoms; Recommended follow up-urology follow-up as soon as possible.     Final Clinical Impression(s) / ED Diagnoses Final diagnoses:  Dysuria  Dehydration  Prostate cancer ([place])     Rx / DC Orders ED Discharge Orders          Ordered    phenazopyridine (PYRIDIUM) 200 MG tablet  3 times daily PRN        05/12/21 2245             Daleen Bo, MD 05/13/21 1257

## 2021-05-12 NOTE — Discharge Instructions (Addendum)
You have discomfort urinating because of a combination of dehydration, prostate seed implants, and the surgical procedure to prepare for radiation therapy.  Try to drink 2 L of water every day to improve your hydration status it is important to follow-up for treatment with the urologist as planned.  Dr. Alyson Ingles is your treating urologist.  He plans to do a nuclear medicine scan of your whole body to evaluate for metastatic cancer.  He is also recommending a CT of the abdomen pelvis, in preparation for radiation therapy to treat prostate cancer.  We are giving a prescription for medication to treat help control the discomfort when you urinate.

## 2021-05-12 NOTE — ED Triage Notes (Signed)
Pt states he has not been able to empty his bladder x 2 weeks; pt has prostate cancer

## 2021-05-31 ENCOUNTER — Other Ambulatory Visit: Payer: Self-pay

## 2021-05-31 ENCOUNTER — Ambulatory Visit (INDEPENDENT_AMBULATORY_CARE_PROVIDER_SITE_OTHER): Admitting: Urology

## 2021-05-31 ENCOUNTER — Encounter: Payer: Self-pay | Admitting: Urology

## 2021-05-31 VITALS — BP 160/92 | HR 69

## 2021-05-31 DIAGNOSIS — N401 Enlarged prostate with lower urinary tract symptoms: Secondary | ICD-10-CM | POA: Diagnosis not present

## 2021-05-31 DIAGNOSIS — R3912 Poor urinary stream: Secondary | ICD-10-CM | POA: Diagnosis not present

## 2021-05-31 DIAGNOSIS — N138 Other obstructive and reflux uropathy: Secondary | ICD-10-CM

## 2021-05-31 DIAGNOSIS — C61 Malignant neoplasm of prostate: Secondary | ICD-10-CM | POA: Diagnosis not present

## 2021-05-31 MED ORDER — LEUPROLIDE ACETATE (6 MONTH) 45 MG ~~LOC~~ KIT
45.0000 mg | PACK | Freq: Once | SUBCUTANEOUS | Status: AC
  Administered 2021-05-31: 45 mg via SUBCUTANEOUS

## 2021-05-31 NOTE — Progress Notes (Signed)
05/31/2021 10:03 AM   Juan Juarez Jul 22, 1955 [id number]  Referring provider: Ignatius Specking, MD [address] [place],  [place] [address]  Followup prostate cancer   HPI: [name] is [phone] here for followup for prostate cancer. He underwent fiducial markers 5 weeks ago and was scheduled to start IMRT 10/31. He is currently incarcerated and did not start therapy.  He has mild LUTSv on no BPH therapy.    PMH: Past Medical History:  Diagnosis Date   Anxiety    Chronic elbow pain, left    Depression    Diabetes mellitus, type II (HCC)    type II   Gout    Head injury 12/2017   Hepatitis C without hepatic coma    Hypertension    denies   Inflammatory arthropathy    left elbow   Insomnia    Non compliance w medication regimen    Osteoarthritis    Polysubstance abuse (HCC)    Prostate cancer (HCC)    Protein-calorie malnutrition, severe (HCC) 09/13/2016   Seizure (HCC) 12/2017   after head Injury   Thrombocytopenia [facility])     Surgical History: Past Surgical History:  Procedure Laterality Date   BURR HOLE Right 01/22/2018   Procedure: RIGHT BURR HOLES;  Surgeon: Tressie Stalker, MD;  Location: [facility] OR;  Service: Neurosurgery;  Laterality: Right;   GOLD SEED IMPLANT N/A 04/20/2021   Procedure: GOLD SEED IMPLANT;  Surgeon: Malen Gauze, MD;  Location: [facility] ORS;  Service: Urology;  Laterality: N/A;   I & D EXTREMITY Left 11/06/2018   Procedure: IRRIGATION AND DEBRIDEMENT LEFT ELBOW JOINT;  Surgeon: Dairl Ponder, MD;  Location: [facility] OR;  Service: Orthopedics;  Laterality: Left;   INCISION AND DRAINAGE Left 10/24/2018   Procedure: INCISION AND DRAINAGE LEFT ELBOW WITH MASS EXCISION;  Surgeon: Dairl Ponder, MD;  [facility] OR;  Service: Orthopedics;  Laterality: Left;   MULTIPLE TOOTH EXTRACTIONS     None     SPACE OAR INSTILLATION N/A 04/20/2021   Procedure: SPACE OAR INSTILLATION;  Surgeon: Malen Gauze, MD;  Location: [facility];  Service: Urology;  Laterality:  N/A;    Home Medications:  Allergies as of 05/31/2021   No Known Allergies      Medication List        Accurate as of May 31, 2021 10:03 AM. If you have any questions, ask your nurse or doctor.          STOP taking these medications    amLODipine 5 MG tablet Commonly known as: NORVASC Stopped by: Wilkie Aye, MD   bacitracin ointment Stopped by: Wilkie Aye, MD   cloBAZam 10 MG tablet Commonly known as: ONFI Stopped by: Wilkie Aye, MD   nicotine 21 mg/24hr patch Commonly known as: NICODERM CQ - dosed in mg/24 hours Stopped by: Wilkie Aye, MD   pantoprazole 40 MG tablet Commonly known as: PROTONIX Stopped by: Wilkie Aye, MD       TAKE these medications    blood glucose meter kit and supplies Dispense based on patient and insurance preference. Use up to four times daily as directed. (FOR ICD-10 E10.9, E11.9).   clonazePAM 0.5 MG tablet Commonly known as: KLONOPIN Take 0.5 mg by mouth daily.   doxepin 75 MG capsule Commonly known as: SINEQUAN Take 75-150 mg by mouth at bedtime as needed (Sleep).   Easy Comfort Insulin Syringe 31G X 5/16" 1 ML Misc Generic drug: Insulin Syringe-Needle U-100 USE AS DIRECTED ONCE DAILY  FOR LEVEMIR FLEXTOUCH 100 DAY SUPPLY   glipiZIDE 5 MG 24 hr tablet Commonly known as: GLUCOTROL XL Take 5 mg by mouth daily.   hydrOXYzine 25 MG tablet Commonly known as: ATARAX Take 1 tablet (25 mg total) by mouth 3 (three) times daily as needed for anxiety. What changed: when to take this   metFORMIN 500 MG tablet Commonly known as: GLUCOPHAGE Take 500 mg by mouth 2 (two) times daily.   phenazopyridine 200 MG tablet Commonly known as: PYRIDIUM Take 1 tablet (200 mg total) by mouth 3 (three) times daily as needed (Painful urination).   QUEtiapine 100 MG tablet Commonly known as: SEROQUEL Take 100 mg by mouth at bedtime. What changed: Another medication with the same name was changed. Make sure  you understand how and when to take each.   QUEtiapine 400 MG tablet Commonly known as: SEROQUEL Take 1 tablet (400 mg total) by mouth at bedtime. For mood control What changed:  how much to take when to take this reasons to take this   sertraline 100 MG tablet Commonly known as: ZOLOFT Take 100 mg by mouth daily.   traMADol 50 MG tablet Commonly known as: Ultram Take 1 tablet (50 mg total) by mouth every 6 (six) hours as needed.        Allergies: No Known Allergies  Family History: Family History  Problem Relation Age of Onset   Thyroid disease Sister    Colon cancer Neg Hx    Colon polyps Neg Hx    Breast cancer Neg Hx    Prostate cancer Neg Hx    Pancreatic cancer Neg Hx     Social History:  reports that he has been smoking cigarettes. He has a 25.00 pack-year smoking history. He has never used smokeless tobacco. He reports that he does not currently use alcohol. He reports that he does not currently use drugs after having used the following drugs: Marijuana and Cocaine.  ROS: All other review of systems were reviewed and are negative except what is noted above in HPI  Physical Exam: BP (!) 160/92   Pulse 69   Constitutional:  Alert and oriented, No acute distress. HEENT: [place] AT, moist mucus membranes.  Trachea midline, no masses. Cardiovascular: No clubbing, cyanosis, or edema. Respiratory: Normal respiratory effort, no increased work of breathing. GI: Abdomen is soft, nontender, nondistended, no abdominal masses GU: No CVA tenderness.  Lymph: No cervical or inguinal lymphadenopathy. Skin: No rashes, bruises or suspicious lesions. Neurologic: Grossly intact, no focal deficits, moving all 4 extremities. Psychiatric: Normal mood and affect.  Laboratory Data: Lab Results  Component Value Date   WBC 7.8 05/12/2021   HGB 14.8 05/12/2021   HCT 41.7 05/12/2021   MCV 90.8 05/12/2021   PLT 97 (L) 05/12/2021    Lab Results  Component Value Date   CREATININE  1.35 (H) 05/12/2021    No results found for: PSA  No results found for: TESTOSTERONE  Lab Results  Component Value Date   HGBA1C 5.8 (H) 01/30/2021    Urinalysis    Component Value Date/Time   COLORURINE YELLOW 02/02/2021 2053   APPEARANCEUR CLEAR 02/02/2021 2053   APPEARANCEUR Clear 08/29/2020 1313   LABSPEC 1.012 02/02/2021 2053   PHURINE 6.0 02/02/2021 2053   GLUCOSEU NEGATIVE 02/02/2021 2053   HGBUR MODERATE (A) 02/02/2021 2053   BILIRUBINUR NEGATIVE 02/02/2021 2053   BILIRUBINUR Negative 08/29/2020 [place] 02/02/2021 2053   PROTEINUR >=300 (A) 02/02/2021 2053   UROBILINOGEN  0.2 07/01/2014 2244   NITRITE NEGATIVE 02/02/2021 2053   LEUKOCYTESUR NEGATIVE 02/02/2021 2053    Lab Results  Component Value Date   LABMICR See below: 08/29/2020   WBCUA None seen 08/29/2020   LABEPIT None seen 08/29/2020   MUCUS Present 08/29/2020   BACTERIA NONE SEEN 02/02/2021    Pertinent Imaging:  No results found for this or any previous visit.  No results found for this or any previous visit.  No results found for this or any previous visit.  No results found for this or any previous visit.  No results found for this or any previous visit.  No results found for this or any previous visit.  No results found for this or any previous visit.  No results found for this or any previous visit.   Assessment & Plan:    1. Prostate cancer [facility]) -Patient to start Radiation therapy at [facility] ASAP.  Eligard $Remove'45mg'tQisHMb$  today  2. Weak urinary stream -patient defers therapy at this time  3. Benign prostatic hyperplasia with urinary obstruction -patient defers therapy at this time   No follow-ups on file.  Nicolette Bang, MD  [facility] [place]

## 2021-05-31 NOTE — Patient Instructions (Signed)
Prostate Cancer °The prostate is a small gland that helps make semen. It is located below a man's bladder, in front of the rectum. Prostate cancer is when abnormal cells grow in this gland. °What are the causes? °The cause of this condition is not known. °What increases the risk? °Being age 65 or older. °Having a family history of prostate cancer. °Having a family history of cancer of the breasts or ovaries. °Having genes that are passed from parent to child (inherited). °Having Lynch syndrome. °African American men and men of African descent are diagnosed with prostate cancer at higher rates than other men. °What are the signs or symptoms? °Problems peeing (urinating). This may include: °A stream that is weak, or pee that stops and starts. °Trouble starting or stopping your pee. °Trouble emptying all of your pee. °Needing to pee more often, especially at night. °Blood in your pee or semen. °Pain in the: °Lower back. °Lower belly (abdomen). °Hips. °Trouble getting an erection. °Weakness or numbness in the legs or feet. °How is this treated? °Treatment for this condition depends on: °How much the cancer has spread. °Your age. °The kind of treatment you want. °Your health. °Treatments include: °Being watched. This is called observation. You will be tested from time to time, but you will not get treated. Tests are to make sure that the cancer is not growing. °Surgery. This may be done to: °Take out (remove) the prostate. °Freeze and kill cancer cells. °Radiation. This uses a strong beam of energy to kill cancer cells. °Chemotherapy. This uses medicines that stop cancer cells from increasing. This kills cancer cells and healthy cells. °Targeted therapy. This kills cancer cells only. Healthy cells are not affected. °Hormone treatment. This stops the body from making hormones that help the cancer cells grow. °Follow these instructions at home: °Lifestyle °Do not smoke or use any products that contain nicotine or tobacco.  If you need help quitting, ask your doctor. °Eat a healthy diet. °Treatment may affect your ability to have sex. If you have a partner, touch, hold, hug, and caress your partner to have intimate moments. °Get plenty of sleep. °Ask your doctor for help to find a support group for men with prostate cancer. °General instructions °Take over-the-counter and prescription medicines only as told by your doctor. °If you have to go to the hospital, let your cancer doctor (oncologist) know. °Keep all follow-up visits. °Where to find more information °American Cancer Society: www.cancer.org °American Society of Clinical Oncology: www.cancer.net °National Cancer Institute: www.cancer.gov °Contact a doctor if: °You have new or more trouble peeing. °You have new or more blood in your pee. °You have new or more pain in your hips, back, or chest. °Get help right away if: °You have weakness in your legs. °You lose feeling in your legs. °You cannot control your pee or your poop (stool). °You have chills or a fever. °Summary °The prostate is a male gland that helps make semen. °Prostate cancer is when abnormal cells grow in this gland. °Treatment includes doing surgery, using medicines, using strong beams of energy, or watching without treatment. °Ask your doctor for help to find a support group for men with prostate cancer. °Contact a doctor if you have problems peeing or have any new pain that you did not have before. °This information is not intended to replace advice given to you by your health care provider. Make sure you discuss any questions you have with your health care provider. °Document Revised: 09/14/2020 Document Reviewed: 09/14/2020 °Elsevier   Patient Education © 2022 Elsevier Inc. ° °

## 2021-05-31 NOTE — Progress Notes (Signed)
Eligard SubQ Injection  ? ?Due to Prostate Cancer patient is present today for a Eligard Injection. ? ?Medication: Eligard 6 month ?Dose: 45 mg  ?Location: right  ? ? ?Patient tolerated well, no complications were noted ? ?Performed by: Liliauna Santoni LPN ? ?

## 2021-05-31 NOTE — Progress Notes (Signed)
Urological Symptom Review  Patient is experiencing the following symptoms: Stream starts and stops Have to strain to urinate Weak stream   Review of Systems  Gastrointestinal (upper)  : Negative for upper GI symptoms  Gastrointestinal (lower) : Constipation  Constitutional : Negative for symptoms  Skin: Negative for skin symptoms  Eyes: Negative for eye symptoms  Ear/Nose/Throat : Negative for Ear/Nose/Throat symptoms  Hematologic/Lymphatic: Negative for Hematologic/Lymphatic symptoms  Cardiovascular : Negative for cardiovascular symptoms  Respiratory : Negative for respiratory symptoms  Endocrine: Negative for endocrine symptoms  Musculoskeletal: Negative for musculoskeletal symptoms  Neurological: Negative for neurological symptoms  Psychologic: Negative for psychiatric symptoms

## 2021-06-08 DIAGNOSIS — Z20822 Contact with and (suspected) exposure to covid-19: Secondary | ICD-10-CM | POA: Diagnosis not present

## 2021-06-16 DIAGNOSIS — C61 Malignant neoplasm of prostate: Secondary | ICD-10-CM | POA: Diagnosis not present

## 2021-06-18 DIAGNOSIS — R7989 Other specified abnormal findings of blood chemistry: Secondary | ICD-10-CM | POA: Diagnosis not present

## 2021-06-18 DIAGNOSIS — G40909 Epilepsy, unspecified, not intractable, without status epilepticus: Secondary | ICD-10-CM | POA: Diagnosis not present

## 2021-07-24 DIAGNOSIS — I159 Secondary hypertension, unspecified: Secondary | ICD-10-CM | POA: Diagnosis not present

## 2021-07-24 DIAGNOSIS — Z8669 Personal history of other diseases of the nervous system and sense organs: Secondary | ICD-10-CM | POA: Diagnosis not present

## 2021-07-24 DIAGNOSIS — E1169 Type 2 diabetes mellitus with other specified complication: Secondary | ICD-10-CM | POA: Diagnosis not present

## 2021-07-24 DIAGNOSIS — R2990 Unspecified symptoms and signs involving the nervous system: Secondary | ICD-10-CM | POA: Diagnosis not present

## 2021-07-24 DIAGNOSIS — R569 Unspecified convulsions: Secondary | ICD-10-CM | POA: Diagnosis not present

## 2021-07-24 DIAGNOSIS — I10 Essential (primary) hypertension: Secondary | ICD-10-CM | POA: Diagnosis not present

## 2021-07-25 DIAGNOSIS — I159 Secondary hypertension, unspecified: Secondary | ICD-10-CM | POA: Diagnosis not present

## 2021-07-25 DIAGNOSIS — R2990 Unspecified symptoms and signs involving the nervous system: Secondary | ICD-10-CM | POA: Diagnosis not present

## 2021-07-25 DIAGNOSIS — R569 Unspecified convulsions: Secondary | ICD-10-CM | POA: Diagnosis not present

## 2021-07-26 DIAGNOSIS — R569 Unspecified convulsions: Secondary | ICD-10-CM | POA: Diagnosis not present

## 2021-07-27 DIAGNOSIS — D696 Thrombocytopenia, unspecified: Secondary | ICD-10-CM | POA: Diagnosis not present

## 2021-07-27 DIAGNOSIS — I6601 Occlusion and stenosis of right middle cerebral artery: Secondary | ICD-10-CM | POA: Diagnosis not present

## 2021-07-27 DIAGNOSIS — G40901 Epilepsy, unspecified, not intractable, with status epilepticus: Secondary | ICD-10-CM | POA: Diagnosis not present

## 2021-07-27 DIAGNOSIS — I10 Essential (primary) hypertension: Secondary | ICD-10-CM | POA: Diagnosis not present

## 2021-07-27 DIAGNOSIS — R569 Unspecified convulsions: Secondary | ICD-10-CM | POA: Diagnosis not present

## 2021-07-28 DIAGNOSIS — I6601 Occlusion and stenosis of right middle cerebral artery: Secondary | ICD-10-CM | POA: Diagnosis not present

## 2021-07-28 DIAGNOSIS — D696 Thrombocytopenia, unspecified: Secondary | ICD-10-CM | POA: Diagnosis not present

## 2021-07-28 DIAGNOSIS — R569 Unspecified convulsions: Secondary | ICD-10-CM | POA: Diagnosis not present

## 2021-07-28 DIAGNOSIS — G40901 Epilepsy, unspecified, not intractable, with status epilepticus: Secondary | ICD-10-CM | POA: Diagnosis not present

## 2021-07-28 DIAGNOSIS — I10 Essential (primary) hypertension: Secondary | ICD-10-CM | POA: Diagnosis not present

## 2021-08-13 DIAGNOSIS — Z20822 Contact with and (suspected) exposure to covid-19: Secondary | ICD-10-CM | POA: Diagnosis not present

## 2021-08-18 DIAGNOSIS — Z20822 Contact with and (suspected) exposure to covid-19: Secondary | ICD-10-CM | POA: Diagnosis not present

## 2021-08-28 DIAGNOSIS — Z20822 Contact with and (suspected) exposure to covid-19: Secondary | ICD-10-CM | POA: Diagnosis not present

## 2021-10-05 DIAGNOSIS — Z20822 Contact with and (suspected) exposure to covid-19: Secondary | ICD-10-CM | POA: Diagnosis not present

## 2021-11-18 DIAGNOSIS — R569 Unspecified convulsions: Secondary | ICD-10-CM | POA: Diagnosis not present

## 2021-11-18 DIAGNOSIS — R410 Disorientation, unspecified: Secondary | ICD-10-CM | POA: Diagnosis not present

## 2021-11-18 DIAGNOSIS — M25521 Pain in right elbow: Secondary | ICD-10-CM | POA: Diagnosis not present

## 2021-12-11 ENCOUNTER — Ambulatory Visit: Admitting: Urology

## 2021-12-18 ENCOUNTER — Ambulatory Visit: Admitting: Urology

## 2021-12-18 DIAGNOSIS — C61 Malignant neoplasm of prostate: Secondary | ICD-10-CM

## 2022-04-18 DIAGNOSIS — R4182 Altered mental status, unspecified: Secondary | ICD-10-CM | POA: Diagnosis not present

## 2022-04-18 DIAGNOSIS — R55 Syncope and collapse: Secondary | ICD-10-CM | POA: Diagnosis not present

## 2022-04-18 DIAGNOSIS — G40901 Epilepsy, unspecified, not intractable, with status epilepticus: Secondary | ICD-10-CM | POA: Diagnosis not present

## 2022-04-18 DIAGNOSIS — Z743 Need for continuous supervision: Secondary | ICD-10-CM | POA: Diagnosis not present

## 2022-04-18 DIAGNOSIS — R569 Unspecified convulsions: Secondary | ICD-10-CM | POA: Diagnosis not present

## 2022-04-18 DIAGNOSIS — I10 Essential (primary) hypertension: Secondary | ICD-10-CM | POA: Diagnosis not present

## 2022-04-18 DIAGNOSIS — R404 Transient alteration of awareness: Secondary | ICD-10-CM | POA: Diagnosis not present

## 2022-04-18 DIAGNOSIS — S0081XA Abrasion of other part of head, initial encounter: Secondary | ICD-10-CM | POA: Diagnosis not present

## 2022-07-20 DIAGNOSIS — C61 Malignant neoplasm of prostate: Secondary | ICD-10-CM | POA: Diagnosis not present

## 2022-07-20 DIAGNOSIS — Z79818 Long term (current) use of other agents affecting estrogen receptors and estrogen levels: Secondary | ICD-10-CM | POA: Diagnosis not present

## 2022-07-24 ENCOUNTER — Telehealth: Payer: Self-pay

## 2022-07-24 NOTE — Telephone Encounter (Signed)
I received a call from [facility] requested a follow up appt after treatment. Due to our office not seeing the patient since 2022 I confirmed the home address and phone number. I attempted to contact patient and left a vm advising patient to contact our office to schedule an appt.

## 2022-08-03 ENCOUNTER — Encounter: Payer: Self-pay | Admitting: Urology

## 2022-08-03 ENCOUNTER — Ambulatory Visit (INDEPENDENT_AMBULATORY_CARE_PROVIDER_SITE_OTHER): Payer: Medicare Other | Admitting: Urology

## 2022-08-03 VITALS — BP 116/75 | HR 78

## 2022-08-03 DIAGNOSIS — N401 Enlarged prostate with lower urinary tract symptoms: Secondary | ICD-10-CM

## 2022-08-03 DIAGNOSIS — C61 Malignant neoplasm of prostate: Secondary | ICD-10-CM | POA: Diagnosis not present

## 2022-08-03 DIAGNOSIS — R3912 Poor urinary stream: Secondary | ICD-10-CM

## 2022-08-03 DIAGNOSIS — Z79818 Long term (current) use of other agents affecting estrogen receptors and estrogen levels: Secondary | ICD-10-CM | POA: Diagnosis not present

## 2022-08-03 DIAGNOSIS — N138 Other obstructive and reflux uropathy: Secondary | ICD-10-CM | POA: Diagnosis not present

## 2022-08-03 LAB — URINALYSIS, ROUTINE W REFLEX MICROSCOPIC
Bilirubin, UA: NEGATIVE
Glucose, UA: NEGATIVE
Ketones, UA: NEGATIVE
Leukocytes,UA: NEGATIVE
Nitrite, UA: NEGATIVE
Specific Gravity, UA: 1.02 (ref 1.005–1.030)
Urobilinogen, Ur: 0.2 mg/dL (ref 0.2–1.0)
pH, UA: 5 (ref 5.0–7.5)

## 2022-08-03 LAB — MICROSCOPIC EXAMINATION: Bacteria, UA: NONE SEEN

## 2022-08-03 MED ORDER — LEUPROLIDE ACETATE (6 MONTH) 45 MG ~~LOC~~ KIT
45.0000 mg | PACK | Freq: Once | SUBCUTANEOUS | Status: AC
  Administered 2022-08-03: 45 mg via SUBCUTANEOUS

## 2022-08-03 NOTE — Progress Notes (Signed)
08/03/2022 12:45 PM   Juan Juarez 1955/11/25 [id number]  Referring provider: Glenda Chroman, MD [place],  [place] [address]  Followup prostate cancer   HPI: Juan Juarez is a 67yo here for followup for prostate cancer and weak urinary stream. He finished IMRT 6 months ago. He is due for ADT. IPSS 3 QOL 0 on no BPH therapy. Uirne stream is strong. Nocturia 3x.    PMH: Past Medical History:  Diagnosis Date   Anxiety    Chronic elbow pain, left    Depression    Diabetes mellitus, type II ([place])    type II   Gout    Head injury 12/2017   Hepatitis C without hepatic coma    Hypertension    denies   Inflammatory arthropathy    left elbow   Insomnia    Non compliance w medication regimen    Osteoarthritis    Polysubstance abuse ([place])    Prostate cancer ([place])    Protein-calorie malnutrition, severe ([place]) 09/13/2016   Seizure ([place]) 12/2017   after head Injury   Thrombocytopenia [facility])     Surgical History: Past Surgical History:  Procedure Laterality Date   BURR HOLE Right 01/22/2018   Procedure: RIGHT BURR HOLES;  Surgeon: Newman Pies, MD;  Location: [address] [place];  Service: Neurosurgery;  Laterality: Right;   GOLD SEED IMPLANT N/A 04/20/2021   Procedure: GOLD SEED IMPLANT;  Surgeon: Cleon Gustin, MD;  Location: [facility];  Service: Urology;  Laterality: N/A;   I & D EXTREMITY Left 11/06/2018   Procedure: IRRIGATION AND DEBRIDEMENT LEFT ELBOW JOINT;  Surgeon: Charlotte Crumb, MD;  Location: [place];  Service: Orthopedics;  Laterality: Left;   INCISION AND DRAINAGE Left 10/24/2018   Procedure: INCISION AND DRAINAGE LEFT ELBOW WITH MASS EXCISION;  Surgeon: Charlotte Crumb, MD;  Location: [place];  Service: Orthopedics;  Laterality: Left;   MULTIPLE TOOTH EXTRACTIONS     None     SPACE OAR INSTILLATION N/A 04/20/2021   Procedure: SPACE OAR INSTILLATION;  Surgeon: Cleon Gustin, MD;  Location: [facility];  Service: Urology;  Laterality: N/A;    Home Medications:   Allergies as of 08/03/2022   No Known Allergies      Medication List        Accurate as of August 03, 2022 12:45 PM. If you have any questions, ask your nurse or doctor.          blood glucose meter kit and supplies Dispense based on patient and insurance preference. Use up to four times daily as directed. (FOR ICD-10 E10.9, E11.9).   clonazePAM 0.5 MG tablet Commonly known as: KLONOPIN Take 0.5 mg by mouth daily.   doxepin 75 MG capsule Commonly known as: SINEQUAN Take 75-150 mg by mouth at bedtime as needed (Sleep).   Easy Comfort Insulin Syringe 31G X 5/16" 1 ML Misc Generic drug: Insulin Syringe-Needle U-100 USE AS DIRECTED ONCE DAILY FOR LEVEMIR FLEXTOUCH 100 DAY SUPPLY   glipiZIDE 5 MG 24 hr tablet Commonly known as: GLUCOTROL XL Take 5 mg by mouth daily.   hydrOXYzine 25 MG tablet Commonly known as: ATARAX Take 1 tablet (25 mg total) by mouth 3 (three) times daily as needed for anxiety.   metFORMIN 500 MG tablet Commonly known as: GLUCOPHAGE Take 500 mg by mouth 2 (two) times daily.   phenazopyridine 200 MG tablet Commonly known as: PYRIDIUM Take 1 tablet (200 mg total) by mouth 3 (three) times daily as needed (Painful  urination).   QUEtiapine 100 MG tablet Commonly known as: SEROQUEL Take 100 mg by mouth at bedtime.   QUEtiapine 400 MG tablet Commonly known as: SEROQUEL Take 1 tablet (400 mg total) by mouth at bedtime. For mood control   sertraline 100 MG tablet Commonly known as: ZOLOFT Take 100 mg by mouth daily.        Allergies: No Known Allergies  Family History: Family History  Problem Relation Age of Onset   Thyroid disease Sister    Colon cancer Neg Hx    Colon polyps Neg Hx    Breast cancer Neg Hx    Prostate cancer Neg Hx    Pancreatic cancer Neg Hx     Social History:  reports that he has been smoking cigarettes. He has a 25.00 pack-year smoking history. He has never used smokeless tobacco. He reports that he does not  currently use alcohol. He reports that he does not currently use drugs after having used the following drugs: Marijuana and Cocaine.  ROS: All other review of systems were reviewed and are negative except what is noted above in HPI  Physical Exam: BP 116/75   Pulse 78   Constitutional:  Alert and oriented, No acute distress. HEENT: [place] AT, moist mucus membranes.  Trachea midline, no masses. Cardiovascular: No clubbing, cyanosis, or edema. Respiratory: Normal respiratory effort, no increased work of breathing. GI: Abdomen is soft, nontender, nondistended, no abdominal masses GU: No CVA tenderness.  Lymph: No cervical or inguinal lymphadenopathy. Skin: No rashes, bruises or suspicious lesions. Neurologic: Grossly intact, no focal deficits, moving all 4 extremities. Psychiatric: Normal mood and affect.  Laboratory Data: Lab Results  Component Value Date   WBC 7.8 05/12/2021   HGB 14.8 05/12/2021   HCT 41.7 05/12/2021   MCV 90.8 05/12/2021   PLT 97 (L) 05/12/2021    Lab Results  Component Value Date   CREATININE 1.35 (H) 05/12/2021    No results found for: "PSA"  No results found for: "TESTOSTERONE"  Lab Results  Component Value Date   HGBA1C 5.8 (H) 01/30/2021    Urinalysis    Component Value Date/Time   COLORURINE YELLOW 02/02/2021 2053   APPEARANCEUR CLEAR 02/02/2021 2053   APPEARANCEUR Clear 08/29/2020 1313   LABSPEC 1.012 02/02/2021 2053   PHURINE 6.0 02/02/2021 2053   GLUCOSEU NEGATIVE 02/02/2021 2053   HGBUR MODERATE (A) 02/02/2021 2053   BILIRUBINUR NEGATIVE 02/02/2021 2053   BILIRUBINUR Negative 08/29/2020 1313   KETONESUR NEGATIVE 02/02/2021 2053   PROTEINUR >=300 (A) 02/02/2021 2053   UROBILINOGEN 0.2 07/01/2014 2244   NITRITE NEGATIVE 02/02/2021 2053   LEUKOCYTESUR NEGATIVE 02/02/2021 2053    Lab Results  Component Value Date   LABMICR See below: 08/29/2020   WBCUA None seen 08/29/2020   LABEPIT None seen 08/29/2020   MUCUS Present 08/29/2020    BACTERIA NONE SEEN 02/02/2021    Pertinent Imaging:  No results found for this or any previous visit.  No results found for this or any previous visit.  No results found for this or any previous visit.  No results found for this or any previous visit.  No results found for this or any previous visit.  No valid procedures specified. No results found for this or any previous visit.  No results found for this or any previous visit.   Assessment & Plan:    1. Prostate cancer ([place]) -eligard '45mg'$  today. Followup 6 months with PSA - Urinalysis, Routine w reflex microscopic  2. Weak  urinary stream -resolved  3. Benign prostatic hyperplasia with urinary obstruction -patient defers therapy at this time   No follow-ups on file.  Nicolette Bang, MD  [facility] [place]

## 2022-08-03 NOTE — Progress Notes (Signed)
Eligard SubQ Injection   Due to Prostate Cancer patient is present today for a Eligard Injection.  Medication: Eligard 6 month Dose: 45 mg  Location: right  Lot: [id number] Exp: 07/03/2023  Patient tolerated well, no complications were noted  Performed by: Levi Aland, CMA

## 2022-08-03 NOTE — Patient Instructions (Signed)

## 2022-08-15 DIAGNOSIS — C61 Malignant neoplasm of prostate: Secondary | ICD-10-CM | POA: Diagnosis not present

## 2022-08-17 DIAGNOSIS — D696 Thrombocytopenia, unspecified: Secondary | ICD-10-CM | POA: Diagnosis not present

## 2022-08-17 DIAGNOSIS — G4700 Insomnia, unspecified: Secondary | ICD-10-CM | POA: Diagnosis not present

## 2022-08-17 DIAGNOSIS — R569 Unspecified convulsions: Secondary | ICD-10-CM | POA: Diagnosis not present

## 2022-08-17 DIAGNOSIS — Z299 Encounter for prophylactic measures, unspecified: Secondary | ICD-10-CM | POA: Diagnosis not present

## 2022-08-17 DIAGNOSIS — E119 Type 2 diabetes mellitus without complications: Secondary | ICD-10-CM | POA: Diagnosis not present

## 2022-08-17 DIAGNOSIS — F17210 Nicotine dependence, cigarettes, uncomplicated: Secondary | ICD-10-CM | POA: Diagnosis not present

## 2022-09-03 ENCOUNTER — Other Ambulatory Visit

## 2022-09-11 ENCOUNTER — Ambulatory Visit: Admitting: Urology

## 2022-10-06 DIAGNOSIS — N179 Acute kidney failure, unspecified: Secondary | ICD-10-CM | POA: Diagnosis not present

## 2022-10-06 DIAGNOSIS — E43 Unspecified severe protein-calorie malnutrition: Secondary | ICD-10-CM | POA: Diagnosis not present

## 2022-10-06 DIAGNOSIS — I517 Cardiomegaly: Secondary | ICD-10-CM | POA: Diagnosis not present

## 2022-10-06 DIAGNOSIS — R410 Disorientation, unspecified: Secondary | ICD-10-CM | POA: Diagnosis not present

## 2022-10-06 DIAGNOSIS — S299XXA Unspecified injury of thorax, initial encounter: Secondary | ICD-10-CM | POA: Diagnosis not present

## 2022-10-06 DIAGNOSIS — E1165 Type 2 diabetes mellitus with hyperglycemia: Secondary | ICD-10-CM | POA: Diagnosis not present

## 2022-10-06 DIAGNOSIS — M6282 Rhabdomyolysis: Secondary | ICD-10-CM | POA: Diagnosis not present

## 2022-10-06 DIAGNOSIS — R609 Edema, unspecified: Secondary | ICD-10-CM | POA: Diagnosis not present

## 2022-10-06 DIAGNOSIS — I350 Nonrheumatic aortic (valve) stenosis: Secondary | ICD-10-CM | POA: Diagnosis not present

## 2022-10-06 DIAGNOSIS — T405X1A Poisoning by cocaine, accidental (unintentional), initial encounter: Secondary | ICD-10-CM | POA: Diagnosis not present

## 2022-10-06 DIAGNOSIS — R7989 Other specified abnormal findings of blood chemistry: Secondary | ICD-10-CM | POA: Diagnosis not present

## 2022-10-06 DIAGNOSIS — F1410 Cocaine abuse, uncomplicated: Secondary | ICD-10-CM | POA: Diagnosis not present

## 2022-10-06 DIAGNOSIS — R569 Unspecified convulsions: Secondary | ICD-10-CM | POA: Diagnosis not present

## 2022-10-06 DIAGNOSIS — E876 Hypokalemia: Secondary | ICD-10-CM | POA: Diagnosis not present

## 2022-10-06 DIAGNOSIS — F1010 Alcohol abuse, uncomplicated: Secondary | ICD-10-CM | POA: Diagnosis not present

## 2022-10-06 DIAGNOSIS — W19XXXD Unspecified fall, subsequent encounter: Secondary | ICD-10-CM | POA: Diagnosis not present

## 2022-10-06 DIAGNOSIS — T796XXD Traumatic ischemia of muscle, subsequent encounter: Secondary | ICD-10-CM | POA: Diagnosis not present

## 2022-10-06 DIAGNOSIS — I6381 Other cerebral infarction due to occlusion or stenosis of small artery: Secondary | ICD-10-CM | POA: Diagnosis not present

## 2022-10-06 DIAGNOSIS — R7889 Finding of other specified substances, not normally found in blood: Secondary | ICD-10-CM | POA: Diagnosis not present

## 2022-10-06 DIAGNOSIS — Z8659 Personal history of other mental and behavioral disorders: Secondary | ICD-10-CM | POA: Diagnosis not present

## 2022-10-06 DIAGNOSIS — Z8546 Personal history of malignant neoplasm of prostate: Secondary | ICD-10-CM | POA: Diagnosis not present

## 2022-10-06 DIAGNOSIS — Z743 Need for continuous supervision: Secondary | ICD-10-CM | POA: Diagnosis not present

## 2022-10-06 DIAGNOSIS — B182 Chronic viral hepatitis C: Secondary | ICD-10-CM | POA: Diagnosis not present

## 2022-10-06 DIAGNOSIS — Z79899 Other long term (current) drug therapy: Secondary | ICD-10-CM | POA: Diagnosis not present

## 2022-10-06 DIAGNOSIS — E119 Type 2 diabetes mellitus without complications: Secondary | ICD-10-CM | POA: Diagnosis not present

## 2022-10-06 DIAGNOSIS — I5032 Chronic diastolic (congestive) heart failure: Secondary | ICD-10-CM | POA: Diagnosis not present

## 2022-10-06 DIAGNOSIS — C61 Malignant neoplasm of prostate: Secondary | ICD-10-CM | POA: Diagnosis not present

## 2022-10-06 DIAGNOSIS — I110 Hypertensive heart disease with heart failure: Secondary | ICD-10-CM | POA: Diagnosis not present

## 2022-10-06 DIAGNOSIS — F319 Bipolar disorder, unspecified: Secondary | ICD-10-CM | POA: Diagnosis not present

## 2022-10-06 DIAGNOSIS — S0590XA Unspecified injury of unspecified eye and orbit, initial encounter: Secondary | ICD-10-CM | POA: Diagnosis not present

## 2022-10-06 DIAGNOSIS — W19XXXA Unspecified fall, initial encounter: Secondary | ICD-10-CM | POA: Diagnosis not present

## 2022-10-06 DIAGNOSIS — T796XXA Traumatic ischemia of muscle, initial encounter: Secondary | ICD-10-CM | POA: Diagnosis not present

## 2022-10-09 DIAGNOSIS — I517 Cardiomegaly: Secondary | ICD-10-CM | POA: Diagnosis not present

## 2022-10-09 DIAGNOSIS — I350 Nonrheumatic aortic (valve) stenosis: Secondary | ICD-10-CM | POA: Diagnosis not present

## 2022-10-12 DIAGNOSIS — K7460 Unspecified cirrhosis of liver: Secondary | ICD-10-CM | POA: Diagnosis not present

## 2022-10-12 DIAGNOSIS — I10 Essential (primary) hypertension: Secondary | ICD-10-CM | POA: Diagnosis not present

## 2022-10-12 DIAGNOSIS — F17210 Nicotine dependence, cigarettes, uncomplicated: Secondary | ICD-10-CM | POA: Diagnosis not present

## 2022-10-12 DIAGNOSIS — G4700 Insomnia, unspecified: Secondary | ICD-10-CM | POA: Diagnosis not present

## 2022-10-12 DIAGNOSIS — Z299 Encounter for prophylactic measures, unspecified: Secondary | ICD-10-CM | POA: Diagnosis not present

## 2022-10-12 DIAGNOSIS — Z6830 Body mass index (BMI) 30.0-30.9, adult: Secondary | ICD-10-CM | POA: Diagnosis not present

## 2022-10-25 ENCOUNTER — Encounter: Payer: Self-pay | Admitting: Physician Assistant

## 2022-10-28 DIAGNOSIS — F17210 Nicotine dependence, cigarettes, uncomplicated: Secondary | ICD-10-CM | POA: Diagnosis not present

## 2022-10-28 DIAGNOSIS — D696 Thrombocytopenia, unspecified: Secondary | ICD-10-CM | POA: Diagnosis not present

## 2022-10-28 DIAGNOSIS — F319 Bipolar disorder, unspecified: Secondary | ICD-10-CM | POA: Diagnosis not present

## 2022-10-28 DIAGNOSIS — I700 Atherosclerosis of aorta: Secondary | ICD-10-CM | POA: Diagnosis not present

## 2022-10-28 DIAGNOSIS — E119 Type 2 diabetes mellitus without complications: Secondary | ICD-10-CM | POA: Diagnosis not present

## 2022-10-28 DIAGNOSIS — Z7982 Long term (current) use of aspirin: Secondary | ICD-10-CM | POA: Diagnosis not present

## 2022-10-28 DIAGNOSIS — E43 Unspecified severe protein-calorie malnutrition: Secondary | ICD-10-CM | POA: Diagnosis not present

## 2022-10-28 DIAGNOSIS — K7460 Unspecified cirrhosis of liver: Secondary | ICD-10-CM | POA: Diagnosis not present

## 2022-10-28 DIAGNOSIS — I10 Essential (primary) hypertension: Secondary | ICD-10-CM | POA: Diagnosis not present

## 2022-10-31 DIAGNOSIS — E43 Unspecified severe protein-calorie malnutrition: Secondary | ICD-10-CM | POA: Diagnosis not present

## 2022-10-31 DIAGNOSIS — E119 Type 2 diabetes mellitus without complications: Secondary | ICD-10-CM | POA: Diagnosis not present

## 2022-10-31 DIAGNOSIS — F319 Bipolar disorder, unspecified: Secondary | ICD-10-CM | POA: Diagnosis not present

## 2022-10-31 DIAGNOSIS — I700 Atherosclerosis of aorta: Secondary | ICD-10-CM | POA: Diagnosis not present

## 2022-10-31 DIAGNOSIS — K7460 Unspecified cirrhosis of liver: Secondary | ICD-10-CM | POA: Diagnosis not present

## 2022-10-31 DIAGNOSIS — D696 Thrombocytopenia, unspecified: Secondary | ICD-10-CM | POA: Diagnosis not present

## 2022-10-31 DIAGNOSIS — F17210 Nicotine dependence, cigarettes, uncomplicated: Secondary | ICD-10-CM | POA: Diagnosis not present

## 2022-10-31 DIAGNOSIS — I10 Essential (primary) hypertension: Secondary | ICD-10-CM | POA: Diagnosis not present

## 2022-10-31 DIAGNOSIS — Z7982 Long term (current) use of aspirin: Secondary | ICD-10-CM | POA: Diagnosis not present

## 2022-11-05 DIAGNOSIS — F319 Bipolar disorder, unspecified: Secondary | ICD-10-CM | POA: Diagnosis not present

## 2022-11-05 DIAGNOSIS — D696 Thrombocytopenia, unspecified: Secondary | ICD-10-CM | POA: Diagnosis not present

## 2022-11-05 DIAGNOSIS — K7460 Unspecified cirrhosis of liver: Secondary | ICD-10-CM | POA: Diagnosis not present

## 2022-11-05 DIAGNOSIS — F17210 Nicotine dependence, cigarettes, uncomplicated: Secondary | ICD-10-CM | POA: Diagnosis not present

## 2022-11-05 DIAGNOSIS — I700 Atherosclerosis of aorta: Secondary | ICD-10-CM | POA: Diagnosis not present

## 2022-11-05 DIAGNOSIS — E119 Type 2 diabetes mellitus without complications: Secondary | ICD-10-CM | POA: Diagnosis not present

## 2022-11-05 DIAGNOSIS — I10 Essential (primary) hypertension: Secondary | ICD-10-CM | POA: Diagnosis not present

## 2022-11-05 DIAGNOSIS — E43 Unspecified severe protein-calorie malnutrition: Secondary | ICD-10-CM | POA: Diagnosis not present

## 2022-11-05 DIAGNOSIS — Z7982 Long term (current) use of aspirin: Secondary | ICD-10-CM | POA: Diagnosis not present

## 2022-11-06 DIAGNOSIS — Z7982 Long term (current) use of aspirin: Secondary | ICD-10-CM | POA: Diagnosis not present

## 2022-11-06 DIAGNOSIS — E43 Unspecified severe protein-calorie malnutrition: Secondary | ICD-10-CM | POA: Diagnosis not present

## 2022-11-06 DIAGNOSIS — I10 Essential (primary) hypertension: Secondary | ICD-10-CM | POA: Diagnosis not present

## 2022-11-06 DIAGNOSIS — E119 Type 2 diabetes mellitus without complications: Secondary | ICD-10-CM | POA: Diagnosis not present

## 2022-11-06 DIAGNOSIS — K7460 Unspecified cirrhosis of liver: Secondary | ICD-10-CM | POA: Diagnosis not present

## 2022-11-06 DIAGNOSIS — F17210 Nicotine dependence, cigarettes, uncomplicated: Secondary | ICD-10-CM | POA: Diagnosis not present

## 2022-11-06 DIAGNOSIS — I700 Atherosclerosis of aorta: Secondary | ICD-10-CM | POA: Diagnosis not present

## 2022-11-06 DIAGNOSIS — D696 Thrombocytopenia, unspecified: Secondary | ICD-10-CM | POA: Diagnosis not present

## 2022-11-06 DIAGNOSIS — F319 Bipolar disorder, unspecified: Secondary | ICD-10-CM | POA: Diagnosis not present

## 2022-11-07 DIAGNOSIS — I10 Essential (primary) hypertension: Secondary | ICD-10-CM | POA: Diagnosis not present

## 2022-11-07 DIAGNOSIS — Z7982 Long term (current) use of aspirin: Secondary | ICD-10-CM | POA: Diagnosis not present

## 2022-11-07 DIAGNOSIS — F17210 Nicotine dependence, cigarettes, uncomplicated: Secondary | ICD-10-CM | POA: Diagnosis not present

## 2022-11-07 DIAGNOSIS — D696 Thrombocytopenia, unspecified: Secondary | ICD-10-CM | POA: Diagnosis not present

## 2022-11-07 DIAGNOSIS — F319 Bipolar disorder, unspecified: Secondary | ICD-10-CM | POA: Diagnosis not present

## 2022-11-07 DIAGNOSIS — E43 Unspecified severe protein-calorie malnutrition: Secondary | ICD-10-CM | POA: Diagnosis not present

## 2022-11-07 DIAGNOSIS — E119 Type 2 diabetes mellitus without complications: Secondary | ICD-10-CM | POA: Diagnosis not present

## 2022-11-07 DIAGNOSIS — I700 Atherosclerosis of aorta: Secondary | ICD-10-CM | POA: Diagnosis not present

## 2022-11-07 DIAGNOSIS — K7460 Unspecified cirrhosis of liver: Secondary | ICD-10-CM | POA: Diagnosis not present

## 2022-11-08 DIAGNOSIS — K7460 Unspecified cirrhosis of liver: Secondary | ICD-10-CM | POA: Diagnosis not present

## 2022-11-08 DIAGNOSIS — E43 Unspecified severe protein-calorie malnutrition: Secondary | ICD-10-CM | POA: Diagnosis not present

## 2022-11-08 DIAGNOSIS — E119 Type 2 diabetes mellitus without complications: Secondary | ICD-10-CM | POA: Diagnosis not present

## 2022-11-08 DIAGNOSIS — I10 Essential (primary) hypertension: Secondary | ICD-10-CM | POA: Diagnosis not present

## 2022-11-13 DIAGNOSIS — D696 Thrombocytopenia, unspecified: Secondary | ICD-10-CM | POA: Diagnosis not present

## 2022-11-13 DIAGNOSIS — Z7982 Long term (current) use of aspirin: Secondary | ICD-10-CM | POA: Diagnosis not present

## 2022-11-13 DIAGNOSIS — E43 Unspecified severe protein-calorie malnutrition: Secondary | ICD-10-CM | POA: Diagnosis not present

## 2022-11-13 DIAGNOSIS — F17210 Nicotine dependence, cigarettes, uncomplicated: Secondary | ICD-10-CM | POA: Diagnosis not present

## 2022-11-13 DIAGNOSIS — I10 Essential (primary) hypertension: Secondary | ICD-10-CM | POA: Diagnosis not present

## 2022-11-13 DIAGNOSIS — K7460 Unspecified cirrhosis of liver: Secondary | ICD-10-CM | POA: Diagnosis not present

## 2022-11-13 DIAGNOSIS — F319 Bipolar disorder, unspecified: Secondary | ICD-10-CM | POA: Diagnosis not present

## 2022-11-13 DIAGNOSIS — E119 Type 2 diabetes mellitus without complications: Secondary | ICD-10-CM | POA: Diagnosis not present

## 2022-11-13 DIAGNOSIS — I700 Atherosclerosis of aorta: Secondary | ICD-10-CM | POA: Diagnosis not present

## 2022-11-14 DIAGNOSIS — Z7982 Long term (current) use of aspirin: Secondary | ICD-10-CM | POA: Diagnosis not present

## 2022-11-14 DIAGNOSIS — F17210 Nicotine dependence, cigarettes, uncomplicated: Secondary | ICD-10-CM | POA: Diagnosis not present

## 2022-11-14 DIAGNOSIS — I10 Essential (primary) hypertension: Secondary | ICD-10-CM | POA: Diagnosis not present

## 2022-11-14 DIAGNOSIS — I700 Atherosclerosis of aorta: Secondary | ICD-10-CM | POA: Diagnosis not present

## 2022-11-14 DIAGNOSIS — D696 Thrombocytopenia, unspecified: Secondary | ICD-10-CM | POA: Diagnosis not present

## 2022-11-14 DIAGNOSIS — E43 Unspecified severe protein-calorie malnutrition: Secondary | ICD-10-CM | POA: Diagnosis not present

## 2022-11-14 DIAGNOSIS — K7460 Unspecified cirrhosis of liver: Secondary | ICD-10-CM | POA: Diagnosis not present

## 2022-11-14 DIAGNOSIS — E119 Type 2 diabetes mellitus without complications: Secondary | ICD-10-CM | POA: Diagnosis not present

## 2022-11-14 DIAGNOSIS — F319 Bipolar disorder, unspecified: Secondary | ICD-10-CM | POA: Diagnosis not present

## 2022-11-19 DIAGNOSIS — Z1339 Encounter for screening examination for other mental health and behavioral disorders: Secondary | ICD-10-CM | POA: Diagnosis not present

## 2022-11-19 DIAGNOSIS — Z1331 Encounter for screening for depression: Secondary | ICD-10-CM | POA: Diagnosis not present

## 2022-11-19 DIAGNOSIS — E6609 Other obesity due to excess calories: Secondary | ICD-10-CM | POA: Diagnosis not present

## 2022-11-19 DIAGNOSIS — F17210 Nicotine dependence, cigarettes, uncomplicated: Secondary | ICD-10-CM | POA: Diagnosis not present

## 2022-11-19 DIAGNOSIS — Z0000 Encounter for general adult medical examination without abnormal findings: Secondary | ICD-10-CM | POA: Diagnosis not present

## 2022-11-19 DIAGNOSIS — E1165 Type 2 diabetes mellitus with hyperglycemia: Secondary | ICD-10-CM | POA: Diagnosis not present

## 2022-11-19 DIAGNOSIS — Z6830 Body mass index (BMI) 30.0-30.9, adult: Secondary | ICD-10-CM | POA: Diagnosis not present

## 2022-11-19 DIAGNOSIS — Z299 Encounter for prophylactic measures, unspecified: Secondary | ICD-10-CM | POA: Diagnosis not present

## 2022-11-19 DIAGNOSIS — I10 Essential (primary) hypertension: Secondary | ICD-10-CM | POA: Diagnosis not present

## 2022-11-20 DIAGNOSIS — I700 Atherosclerosis of aorta: Secondary | ICD-10-CM | POA: Diagnosis not present

## 2022-11-20 DIAGNOSIS — D696 Thrombocytopenia, unspecified: Secondary | ICD-10-CM | POA: Diagnosis not present

## 2022-11-20 DIAGNOSIS — I10 Essential (primary) hypertension: Secondary | ICD-10-CM | POA: Diagnosis not present

## 2022-11-20 DIAGNOSIS — F17210 Nicotine dependence, cigarettes, uncomplicated: Secondary | ICD-10-CM | POA: Diagnosis not present

## 2022-11-20 DIAGNOSIS — E119 Type 2 diabetes mellitus without complications: Secondary | ICD-10-CM | POA: Diagnosis not present

## 2022-11-20 DIAGNOSIS — K7460 Unspecified cirrhosis of liver: Secondary | ICD-10-CM | POA: Diagnosis not present

## 2022-11-20 DIAGNOSIS — F319 Bipolar disorder, unspecified: Secondary | ICD-10-CM | POA: Diagnosis not present

## 2022-11-20 DIAGNOSIS — E43 Unspecified severe protein-calorie malnutrition: Secondary | ICD-10-CM | POA: Diagnosis not present

## 2022-11-20 DIAGNOSIS — Z7982 Long term (current) use of aspirin: Secondary | ICD-10-CM | POA: Diagnosis not present

## 2022-11-23 ENCOUNTER — Ambulatory Visit: Payer: Medicare HMO | Admitting: Physician Assistant

## 2022-11-23 ENCOUNTER — Ambulatory Visit: Payer: Medicare Other

## 2022-11-29 DIAGNOSIS — I10 Essential (primary) hypertension: Secondary | ICD-10-CM | POA: Diagnosis not present

## 2022-11-29 DIAGNOSIS — E1165 Type 2 diabetes mellitus with hyperglycemia: Secondary | ICD-10-CM | POA: Diagnosis not present

## 2022-11-29 DIAGNOSIS — R569 Unspecified convulsions: Secondary | ICD-10-CM | POA: Diagnosis not present

## 2022-11-29 DIAGNOSIS — Z299 Encounter for prophylactic measures, unspecified: Secondary | ICD-10-CM | POA: Diagnosis not present

## 2022-11-29 DIAGNOSIS — G4700 Insomnia, unspecified: Secondary | ICD-10-CM | POA: Diagnosis not present

## 2022-11-29 DIAGNOSIS — F17210 Nicotine dependence, cigarettes, uncomplicated: Secondary | ICD-10-CM | POA: Diagnosis not present

## 2022-11-30 DIAGNOSIS — F419 Anxiety disorder, unspecified: Secondary | ICD-10-CM | POA: Diagnosis not present

## 2022-11-30 DIAGNOSIS — F17210 Nicotine dependence, cigarettes, uncomplicated: Secondary | ICD-10-CM | POA: Diagnosis not present

## 2022-11-30 DIAGNOSIS — W06XXXA Fall from bed, initial encounter: Secondary | ICD-10-CM | POA: Diagnosis not present

## 2022-11-30 DIAGNOSIS — Z7982 Long term (current) use of aspirin: Secondary | ICD-10-CM | POA: Diagnosis not present

## 2022-11-30 DIAGNOSIS — Z79899 Other long term (current) drug therapy: Secondary | ICD-10-CM | POA: Diagnosis not present

## 2022-11-30 DIAGNOSIS — T148XXA Other injury of unspecified body region, initial encounter: Secondary | ICD-10-CM | POA: Diagnosis not present

## 2022-11-30 DIAGNOSIS — W19XXXA Unspecified fall, initial encounter: Secondary | ICD-10-CM | POA: Diagnosis not present

## 2022-11-30 DIAGNOSIS — M109 Gout, unspecified: Secondary | ICD-10-CM | POA: Diagnosis not present

## 2022-11-30 DIAGNOSIS — R0781 Pleurodynia: Secondary | ICD-10-CM | POA: Diagnosis not present

## 2022-11-30 DIAGNOSIS — F32A Depression, unspecified: Secondary | ICD-10-CM | POA: Diagnosis not present

## 2022-11-30 DIAGNOSIS — Z043 Encounter for examination and observation following other accident: Secondary | ICD-10-CM | POA: Diagnosis not present

## 2022-11-30 DIAGNOSIS — I10 Essential (primary) hypertension: Secondary | ICD-10-CM | POA: Diagnosis not present

## 2022-11-30 DIAGNOSIS — Z743 Need for continuous supervision: Secondary | ICD-10-CM | POA: Diagnosis not present

## 2022-12-02 DIAGNOSIS — T796XXA Traumatic ischemia of muscle, initial encounter: Secondary | ICD-10-CM | POA: Diagnosis not present

## 2022-12-02 DIAGNOSIS — S2242XA Multiple fractures of ribs, left side, initial encounter for closed fracture: Secondary | ICD-10-CM | POA: Diagnosis not present

## 2022-12-02 DIAGNOSIS — S0990XA Unspecified injury of head, initial encounter: Secondary | ICD-10-CM | POA: Diagnosis not present

## 2022-12-02 DIAGNOSIS — I6501 Occlusion and stenosis of right vertebral artery: Secondary | ICD-10-CM | POA: Diagnosis not present

## 2022-12-02 DIAGNOSIS — S271XXA Traumatic hemothorax, initial encounter: Secondary | ICD-10-CM | POA: Diagnosis not present

## 2022-12-02 DIAGNOSIS — S3991XA Unspecified injury of abdomen, initial encounter: Secondary | ICD-10-CM | POA: Diagnosis not present

## 2022-12-02 DIAGNOSIS — F17210 Nicotine dependence, cigarettes, uncomplicated: Secondary | ICD-10-CM | POA: Diagnosis not present

## 2022-12-02 DIAGNOSIS — T22212A Burn of second degree of left forearm, initial encounter: Secondary | ICD-10-CM | POA: Diagnosis not present

## 2022-12-02 DIAGNOSIS — S01312A Laceration without foreign body of left ear, initial encounter: Secondary | ICD-10-CM | POA: Diagnosis not present

## 2022-12-02 DIAGNOSIS — M79602 Pain in left arm: Secondary | ICD-10-CM | POA: Diagnosis not present

## 2022-12-02 DIAGNOSIS — K7460 Unspecified cirrhosis of liver: Secondary | ICD-10-CM | POA: Diagnosis not present

## 2022-12-02 DIAGNOSIS — S2232XA Fracture of one rib, left side, initial encounter for closed fracture: Secondary | ICD-10-CM | POA: Diagnosis not present

## 2022-12-02 DIAGNOSIS — G40909 Epilepsy, unspecified, not intractable, without status epilepticus: Secondary | ICD-10-CM | POA: Diagnosis not present

## 2022-12-02 DIAGNOSIS — R569 Unspecified convulsions: Secondary | ICD-10-CM | POA: Diagnosis not present

## 2022-12-02 DIAGNOSIS — T07XXXA Unspecified multiple injuries, initial encounter: Secondary | ICD-10-CM | POA: Diagnosis not present

## 2022-12-02 DIAGNOSIS — T310 Burns involving less than 10% of body surface: Secondary | ICD-10-CM | POA: Diagnosis not present

## 2022-12-02 DIAGNOSIS — T1490XA Injury, unspecified, initial encounter: Secondary | ICD-10-CM | POA: Diagnosis not present

## 2022-12-02 DIAGNOSIS — Z043 Encounter for examination and observation following other accident: Secondary | ICD-10-CM | POA: Diagnosis not present

## 2022-12-02 DIAGNOSIS — S2239XA Fracture of one rib, unspecified side, initial encounter for closed fracture: Secondary | ICD-10-CM | POA: Diagnosis not present

## 2022-12-02 DIAGNOSIS — R079 Chest pain, unspecified: Secondary | ICD-10-CM | POA: Diagnosis not present

## 2022-12-02 DIAGNOSIS — R6889 Other general symptoms and signs: Secondary | ICD-10-CM | POA: Diagnosis not present

## 2022-12-02 DIAGNOSIS — S3993XA Unspecified injury of pelvis, initial encounter: Secondary | ICD-10-CM | POA: Diagnosis not present

## 2022-12-02 DIAGNOSIS — Z743 Need for continuous supervision: Secondary | ICD-10-CM | POA: Diagnosis not present

## 2022-12-02 DIAGNOSIS — M25522 Pain in left elbow: Secondary | ICD-10-CM | POA: Diagnosis not present

## 2022-12-02 DIAGNOSIS — M545 Low back pain, unspecified: Secondary | ICD-10-CM | POA: Diagnosis not present

## 2022-12-02 DIAGNOSIS — G8911 Acute pain due to trauma: Secondary | ICD-10-CM | POA: Diagnosis not present

## 2022-12-02 DIAGNOSIS — J90 Pleural effusion, not elsewhere classified: Secondary | ICD-10-CM | POA: Diagnosis not present

## 2022-12-02 DIAGNOSIS — S301XXA Contusion of abdominal wall, initial encounter: Secondary | ICD-10-CM | POA: Diagnosis not present

## 2022-12-02 DIAGNOSIS — F419 Anxiety disorder, unspecified: Secondary | ICD-10-CM | POA: Diagnosis not present

## 2022-12-02 DIAGNOSIS — F1290 Cannabis use, unspecified, uncomplicated: Secondary | ICD-10-CM | POA: Diagnosis not present

## 2022-12-02 DIAGNOSIS — F1919 Other psychoactive substance abuse with unspecified psychoactive substance-induced disorder: Secondary | ICD-10-CM | POA: Diagnosis not present

## 2022-12-02 DIAGNOSIS — M546 Pain in thoracic spine: Secondary | ICD-10-CM | POA: Diagnosis not present

## 2022-12-03 DIAGNOSIS — S2242XA Multiple fractures of ribs, left side, initial encounter for closed fracture: Secondary | ICD-10-CM | POA: Diagnosis not present

## 2022-12-03 DIAGNOSIS — T1490XA Injury, unspecified, initial encounter: Secondary | ICD-10-CM | POA: Diagnosis not present

## 2022-12-04 DIAGNOSIS — T07XXXA Unspecified multiple injuries, initial encounter: Secondary | ICD-10-CM | POA: Diagnosis not present

## 2022-12-06 DIAGNOSIS — J90 Pleural effusion, not elsewhere classified: Secondary | ICD-10-CM | POA: Diagnosis not present

## 2022-12-06 DIAGNOSIS — K7030 Alcoholic cirrhosis of liver without ascites: Secondary | ICD-10-CM | POA: Diagnosis not present

## 2022-12-06 DIAGNOSIS — F1490 Cocaine use, unspecified, uncomplicated: Secondary | ICD-10-CM | POA: Diagnosis not present

## 2022-12-06 DIAGNOSIS — G40909 Epilepsy, unspecified, not intractable, without status epilepticus: Secondary | ICD-10-CM | POA: Diagnosis not present

## 2022-12-06 DIAGNOSIS — F17210 Nicotine dependence, cigarettes, uncomplicated: Secondary | ICD-10-CM | POA: Diagnosis not present

## 2022-12-06 DIAGNOSIS — S2242XD Multiple fractures of ribs, left side, subsequent encounter for fracture with routine healing: Secondary | ICD-10-CM | POA: Diagnosis not present

## 2022-12-06 DIAGNOSIS — R6889 Other general symptoms and signs: Secondary | ICD-10-CM | POA: Diagnosis not present

## 2022-12-06 DIAGNOSIS — R531 Weakness: Secondary | ICD-10-CM | POA: Diagnosis not present

## 2022-12-06 DIAGNOSIS — Z743 Need for continuous supervision: Secondary | ICD-10-CM | POA: Diagnosis not present

## 2022-12-06 DIAGNOSIS — M25572 Pain in left ankle and joints of left foot: Secondary | ICD-10-CM | POA: Diagnosis not present

## 2022-12-06 DIAGNOSIS — Z79899 Other long term (current) drug therapy: Secondary | ICD-10-CM | POA: Diagnosis not present

## 2022-12-06 DIAGNOSIS — S022XXA Fracture of nasal bones, initial encounter for closed fracture: Secondary | ICD-10-CM | POA: Diagnosis not present

## 2022-12-06 DIAGNOSIS — F32A Depression, unspecified: Secondary | ICD-10-CM | POA: Diagnosis not present

## 2022-12-06 DIAGNOSIS — F419 Anxiety disorder, unspecified: Secondary | ICD-10-CM | POA: Diagnosis not present

## 2022-12-06 DIAGNOSIS — R600 Localized edema: Secondary | ICD-10-CM | POA: Diagnosis not present

## 2022-12-06 DIAGNOSIS — J9811 Atelectasis: Secondary | ICD-10-CM | POA: Diagnosis not present

## 2022-12-06 DIAGNOSIS — Z720 Tobacco use: Secondary | ICD-10-CM | POA: Diagnosis not present

## 2022-12-06 DIAGNOSIS — S5002XD Contusion of left elbow, subsequent encounter: Secondary | ICD-10-CM | POA: Diagnosis not present

## 2022-12-06 DIAGNOSIS — S2242XA Multiple fractures of ribs, left side, initial encounter for closed fracture: Secondary | ICD-10-CM | POA: Diagnosis not present

## 2022-12-06 DIAGNOSIS — Z7984 Long term (current) use of oral hypoglycemic drugs: Secondary | ICD-10-CM | POA: Diagnosis not present

## 2022-12-06 DIAGNOSIS — I10 Essential (primary) hypertension: Secondary | ICD-10-CM | POA: Diagnosis not present

## 2022-12-06 DIAGNOSIS — W19XXXA Unspecified fall, initial encounter: Secondary | ICD-10-CM | POA: Diagnosis not present

## 2022-12-06 DIAGNOSIS — R296 Repeated falls: Secondary | ICD-10-CM | POA: Diagnosis not present

## 2022-12-06 DIAGNOSIS — S5002XA Contusion of left elbow, initial encounter: Secondary | ICD-10-CM | POA: Diagnosis not present

## 2022-12-06 DIAGNOSIS — E119 Type 2 diabetes mellitus without complications: Secondary | ICD-10-CM | POA: Diagnosis not present

## 2022-12-06 DIAGNOSIS — M1990 Unspecified osteoarthritis, unspecified site: Secondary | ICD-10-CM | POA: Diagnosis not present

## 2022-12-08 DIAGNOSIS — Z720 Tobacco use: Secondary | ICD-10-CM | POA: Diagnosis not present

## 2022-12-08 DIAGNOSIS — R296 Repeated falls: Secondary | ICD-10-CM | POA: Diagnosis not present

## 2022-12-08 DIAGNOSIS — Z79899 Other long term (current) drug therapy: Secondary | ICD-10-CM | POA: Diagnosis not present

## 2022-12-08 DIAGNOSIS — M25572 Pain in left ankle and joints of left foot: Secondary | ICD-10-CM | POA: Diagnosis not present

## 2022-12-08 DIAGNOSIS — Z7984 Long term (current) use of oral hypoglycemic drugs: Secondary | ICD-10-CM | POA: Diagnosis not present

## 2022-12-08 DIAGNOSIS — I10 Essential (primary) hypertension: Secondary | ICD-10-CM | POA: Diagnosis not present

## 2022-12-08 DIAGNOSIS — K7030 Alcoholic cirrhosis of liver without ascites: Secondary | ICD-10-CM | POA: Diagnosis not present

## 2022-12-08 DIAGNOSIS — G40909 Epilepsy, unspecified, not intractable, without status epilepticus: Secondary | ICD-10-CM | POA: Diagnosis not present

## 2022-12-08 DIAGNOSIS — E119 Type 2 diabetes mellitus without complications: Secondary | ICD-10-CM | POA: Diagnosis not present

## 2022-12-13 DIAGNOSIS — F338 Other recurrent depressive disorders: Secondary | ICD-10-CM | POA: Diagnosis not present

## 2022-12-13 DIAGNOSIS — R531 Weakness: Secondary | ICD-10-CM | POA: Diagnosis not present

## 2022-12-13 DIAGNOSIS — S5002XD Contusion of left elbow, subsequent encounter: Secondary | ICD-10-CM | POA: Diagnosis not present

## 2022-12-13 DIAGNOSIS — B182 Chronic viral hepatitis C: Secondary | ICD-10-CM | POA: Diagnosis not present

## 2022-12-13 DIAGNOSIS — B1920 Unspecified viral hepatitis C without hepatic coma: Secondary | ICD-10-CM | POA: Diagnosis not present

## 2022-12-13 DIAGNOSIS — E785 Hyperlipidemia, unspecified: Secondary | ICD-10-CM | POA: Diagnosis not present

## 2022-12-13 DIAGNOSIS — F419 Anxiety disorder, unspecified: Secondary | ICD-10-CM | POA: Diagnosis not present

## 2022-12-13 DIAGNOSIS — E11618 Type 2 diabetes mellitus with other diabetic arthropathy: Secondary | ICD-10-CM | POA: Diagnosis not present

## 2022-12-13 DIAGNOSIS — M1990 Unspecified osteoarthritis, unspecified site: Secondary | ICD-10-CM | POA: Diagnosis not present

## 2022-12-13 DIAGNOSIS — R5381 Other malaise: Secondary | ICD-10-CM | POA: Diagnosis not present

## 2022-12-13 DIAGNOSIS — F17210 Nicotine dependence, cigarettes, uncomplicated: Secondary | ICD-10-CM | POA: Diagnosis not present

## 2022-12-13 DIAGNOSIS — F32A Depression, unspecified: Secondary | ICD-10-CM | POA: Diagnosis not present

## 2022-12-13 DIAGNOSIS — R296 Repeated falls: Secondary | ICD-10-CM | POA: Diagnosis not present

## 2022-12-13 DIAGNOSIS — F1490 Cocaine use, unspecified, uncomplicated: Secondary | ICD-10-CM | POA: Diagnosis not present

## 2022-12-13 DIAGNOSIS — M109 Gout, unspecified: Secondary | ICD-10-CM | POA: Diagnosis not present

## 2022-12-13 DIAGNOSIS — R569 Unspecified convulsions: Secondary | ICD-10-CM | POA: Diagnosis not present

## 2022-12-13 DIAGNOSIS — S2242XD Multiple fractures of ribs, left side, subsequent encounter for fracture with routine healing: Secondary | ICD-10-CM | POA: Diagnosis not present

## 2022-12-13 DIAGNOSIS — E119 Type 2 diabetes mellitus without complications: Secondary | ICD-10-CM | POA: Diagnosis not present

## 2022-12-13 DIAGNOSIS — I10 Essential (primary) hypertension: Secondary | ICD-10-CM | POA: Diagnosis not present

## 2022-12-13 DIAGNOSIS — F411 Generalized anxiety disorder: Secondary | ICD-10-CM | POA: Diagnosis not present

## 2022-12-13 DIAGNOSIS — F1910 Other psychoactive substance abuse, uncomplicated: Secondary | ICD-10-CM | POA: Diagnosis not present

## 2022-12-14 DIAGNOSIS — I10 Essential (primary) hypertension: Secondary | ICD-10-CM | POA: Diagnosis not present

## 2022-12-14 DIAGNOSIS — F338 Other recurrent depressive disorders: Secondary | ICD-10-CM | POA: Diagnosis not present

## 2022-12-14 DIAGNOSIS — F419 Anxiety disorder, unspecified: Secondary | ICD-10-CM | POA: Diagnosis not present

## 2022-12-14 DIAGNOSIS — S2242XD Multiple fractures of ribs, left side, subsequent encounter for fracture with routine healing: Secondary | ICD-10-CM | POA: Diagnosis not present

## 2022-12-14 DIAGNOSIS — M109 Gout, unspecified: Secondary | ICD-10-CM | POA: Diagnosis not present

## 2022-12-14 DIAGNOSIS — M1990 Unspecified osteoarthritis, unspecified site: Secondary | ICD-10-CM | POA: Diagnosis not present

## 2022-12-14 DIAGNOSIS — B182 Chronic viral hepatitis C: Secondary | ICD-10-CM | POA: Diagnosis not present

## 2022-12-14 DIAGNOSIS — F1910 Other psychoactive substance abuse, uncomplicated: Secondary | ICD-10-CM | POA: Diagnosis not present

## 2022-12-17 DIAGNOSIS — R569 Unspecified convulsions: Secondary | ICD-10-CM | POA: Diagnosis not present

## 2022-12-17 DIAGNOSIS — E785 Hyperlipidemia, unspecified: Secondary | ICD-10-CM | POA: Diagnosis not present

## 2022-12-17 DIAGNOSIS — R5381 Other malaise: Secondary | ICD-10-CM | POA: Diagnosis not present

## 2022-12-17 DIAGNOSIS — I10 Essential (primary) hypertension: Secondary | ICD-10-CM | POA: Diagnosis not present

## 2022-12-18 DIAGNOSIS — E785 Hyperlipidemia, unspecified: Secondary | ICD-10-CM | POA: Diagnosis not present

## 2022-12-18 DIAGNOSIS — F419 Anxiety disorder, unspecified: Secondary | ICD-10-CM | POA: Diagnosis not present

## 2022-12-18 DIAGNOSIS — R296 Repeated falls: Secondary | ICD-10-CM | POA: Diagnosis not present

## 2022-12-18 DIAGNOSIS — S2242XD Multiple fractures of ribs, left side, subsequent encounter for fracture with routine healing: Secondary | ICD-10-CM | POA: Diagnosis not present

## 2022-12-18 DIAGNOSIS — I10 Essential (primary) hypertension: Secondary | ICD-10-CM | POA: Diagnosis not present

## 2022-12-18 DIAGNOSIS — E11618 Type 2 diabetes mellitus with other diabetic arthropathy: Secondary | ICD-10-CM | POA: Diagnosis not present

## 2022-12-18 DIAGNOSIS — R569 Unspecified convulsions: Secondary | ICD-10-CM | POA: Diagnosis not present

## 2022-12-18 DIAGNOSIS — B182 Chronic viral hepatitis C: Secondary | ICD-10-CM | POA: Diagnosis not present

## 2022-12-19 DIAGNOSIS — I10 Essential (primary) hypertension: Secondary | ICD-10-CM | POA: Diagnosis not present

## 2022-12-19 DIAGNOSIS — E119 Type 2 diabetes mellitus without complications: Secondary | ICD-10-CM | POA: Diagnosis not present

## 2022-12-19 DIAGNOSIS — F338 Other recurrent depressive disorders: Secondary | ICD-10-CM | POA: Diagnosis not present

## 2022-12-24 DIAGNOSIS — M1990 Unspecified osteoarthritis, unspecified site: Secondary | ICD-10-CM | POA: Diagnosis not present

## 2022-12-24 DIAGNOSIS — S2242XD Multiple fractures of ribs, left side, subsequent encounter for fracture with routine healing: Secondary | ICD-10-CM | POA: Diagnosis not present

## 2022-12-24 DIAGNOSIS — M109 Gout, unspecified: Secondary | ICD-10-CM | POA: Diagnosis not present

## 2022-12-27 DIAGNOSIS — Z9181 History of falling: Secondary | ICD-10-CM | POA: Diagnosis not present

## 2022-12-27 DIAGNOSIS — K7460 Unspecified cirrhosis of liver: Secondary | ICD-10-CM | POA: Diagnosis not present

## 2022-12-27 DIAGNOSIS — G40909 Epilepsy, unspecified, not intractable, without status epilepticus: Secondary | ICD-10-CM | POA: Diagnosis not present

## 2022-12-27 DIAGNOSIS — I110 Hypertensive heart disease with heart failure: Secondary | ICD-10-CM | POA: Diagnosis not present

## 2022-12-27 DIAGNOSIS — F319 Bipolar disorder, unspecified: Secondary | ICD-10-CM | POA: Diagnosis not present

## 2022-12-27 DIAGNOSIS — E782 Mixed hyperlipidemia: Secondary | ICD-10-CM | POA: Diagnosis not present

## 2022-12-27 DIAGNOSIS — E1165 Type 2 diabetes mellitus with hyperglycemia: Secondary | ICD-10-CM | POA: Diagnosis not present

## 2022-12-27 DIAGNOSIS — S2242XD Multiple fractures of ribs, left side, subsequent encounter for fracture with routine healing: Secondary | ICD-10-CM | POA: Diagnosis not present

## 2022-12-27 DIAGNOSIS — I509 Heart failure, unspecified: Secondary | ICD-10-CM | POA: Diagnosis not present

## 2023-01-08 DIAGNOSIS — S2242XD Multiple fractures of ribs, left side, subsequent encounter for fracture with routine healing: Secondary | ICD-10-CM | POA: Diagnosis not present

## 2023-01-08 DIAGNOSIS — E1165 Type 2 diabetes mellitus with hyperglycemia: Secondary | ICD-10-CM | POA: Diagnosis not present

## 2023-01-08 DIAGNOSIS — E782 Mixed hyperlipidemia: Secondary | ICD-10-CM | POA: Diagnosis not present

## 2023-01-08 DIAGNOSIS — K7460 Unspecified cirrhosis of liver: Secondary | ICD-10-CM | POA: Diagnosis not present

## 2023-01-08 DIAGNOSIS — G40909 Epilepsy, unspecified, not intractable, without status epilepticus: Secondary | ICD-10-CM | POA: Diagnosis not present

## 2023-01-08 DIAGNOSIS — I110 Hypertensive heart disease with heart failure: Secondary | ICD-10-CM | POA: Diagnosis not present

## 2023-01-08 DIAGNOSIS — F319 Bipolar disorder, unspecified: Secondary | ICD-10-CM | POA: Diagnosis not present

## 2023-01-08 DIAGNOSIS — I509 Heart failure, unspecified: Secondary | ICD-10-CM | POA: Diagnosis not present

## 2023-01-08 DIAGNOSIS — Z9181 History of falling: Secondary | ICD-10-CM | POA: Diagnosis not present

## 2023-01-14 DIAGNOSIS — I10 Essential (primary) hypertension: Secondary | ICD-10-CM | POA: Diagnosis not present

## 2023-01-14 DIAGNOSIS — R5383 Other fatigue: Secondary | ICD-10-CM | POA: Diagnosis not present

## 2023-01-14 DIAGNOSIS — Z299 Encounter for prophylactic measures, unspecified: Secondary | ICD-10-CM | POA: Diagnosis not present

## 2023-01-14 DIAGNOSIS — Z0000 Encounter for general adult medical examination without abnormal findings: Secondary | ICD-10-CM | POA: Diagnosis not present

## 2023-01-14 DIAGNOSIS — Z125 Encounter for screening for malignant neoplasm of prostate: Secondary | ICD-10-CM | POA: Diagnosis not present

## 2023-01-14 DIAGNOSIS — F17210 Nicotine dependence, cigarettes, uncomplicated: Secondary | ICD-10-CM | POA: Diagnosis not present

## 2023-01-14 DIAGNOSIS — E7800 Pure hypercholesterolemia, unspecified: Secondary | ICD-10-CM | POA: Diagnosis not present

## 2023-01-14 DIAGNOSIS — Z79899 Other long term (current) drug therapy: Secondary | ICD-10-CM | POA: Diagnosis not present

## 2023-01-16 DIAGNOSIS — E1165 Type 2 diabetes mellitus with hyperglycemia: Secondary | ICD-10-CM | POA: Diagnosis not present

## 2023-01-16 DIAGNOSIS — E782 Mixed hyperlipidemia: Secondary | ICD-10-CM | POA: Diagnosis not present

## 2023-01-16 DIAGNOSIS — I509 Heart failure, unspecified: Secondary | ICD-10-CM | POA: Diagnosis not present

## 2023-01-16 DIAGNOSIS — G40909 Epilepsy, unspecified, not intractable, without status epilepticus: Secondary | ICD-10-CM | POA: Diagnosis not present

## 2023-01-16 DIAGNOSIS — S2242XD Multiple fractures of ribs, left side, subsequent encounter for fracture with routine healing: Secondary | ICD-10-CM | POA: Diagnosis not present

## 2023-01-16 DIAGNOSIS — F319 Bipolar disorder, unspecified: Secondary | ICD-10-CM | POA: Diagnosis not present

## 2023-01-16 DIAGNOSIS — Z9181 History of falling: Secondary | ICD-10-CM | POA: Diagnosis not present

## 2023-01-16 DIAGNOSIS — K7460 Unspecified cirrhosis of liver: Secondary | ICD-10-CM | POA: Diagnosis not present

## 2023-01-16 DIAGNOSIS — I110 Hypertensive heart disease with heart failure: Secondary | ICD-10-CM | POA: Diagnosis not present

## 2023-01-28 DIAGNOSIS — R011 Cardiac murmur, unspecified: Secondary | ICD-10-CM | POA: Diagnosis not present

## 2023-01-31 LAB — COLOGUARD

## 2023-02-13 ENCOUNTER — Other Ambulatory Visit: Payer: Medicare Other

## 2023-02-13 DIAGNOSIS — C61 Malignant neoplasm of prostate: Secondary | ICD-10-CM

## 2023-02-14 LAB — PSA: Prostate Specific Ag, Serum: <0.1 ng/mL (ref 0.0–4.0)

## 2023-02-20 ENCOUNTER — Ambulatory Visit (INDEPENDENT_AMBULATORY_CARE_PROVIDER_SITE_OTHER): Payer: Medicare HMO | Admitting: Urology

## 2023-02-20 VITALS — BP 151/75 | HR 71

## 2023-02-20 DIAGNOSIS — C61 Malignant neoplasm of prostate: Secondary | ICD-10-CM

## 2023-02-20 DIAGNOSIS — N401 Enlarged prostate with lower urinary tract symptoms: Secondary | ICD-10-CM

## 2023-02-20 DIAGNOSIS — R3912 Poor urinary stream: Secondary | ICD-10-CM

## 2023-02-20 LAB — URINALYSIS, ROUTINE W REFLEX MICROSCOPIC
Bilirubin, UA: NEGATIVE
Glucose, UA: NEGATIVE
Ketones, UA: NEGATIVE
Leukocytes,UA: NEGATIVE
Nitrite, UA: NEGATIVE
Specific Gravity, UA: 1.03 (ref 1.005–1.030)
Urobilinogen, Ur: 0.2 mg/dL (ref 0.2–1.0)
pH, UA: 6 (ref 5.0–7.5)

## 2023-02-20 LAB — MICROSCOPIC EXAMINATION: Bacteria, UA: NONE SEEN

## 2023-02-20 NOTE — Progress Notes (Signed)
02/20/2023 11:38 AM   Juan Juarez 05-06-1956 [id number]  Referring provider: Ignatius Specking, MD [address] [place],  [place] [address]  Followup prostate cancer   HPI: Juan Juarez is a 67yo here for followup for prostate cancer and BPH with weak urinary stream. PSA undetectable. He denies any hot flashes. IPSS 1 QOl 0 on no therapy. Urine stream strong   PMH: Past Medical History:  Diagnosis Date   Anxiety    Chronic elbow pain, left    Depression    Diabetes mellitus, type II (HCC)    type II   Gout    Head injury 12/2017   Hepatitis C without hepatic coma    Hypertension    denies   Inflammatory arthropathy    left elbow   Insomnia    Non compliance w medication regimen    Osteoarthritis    Polysubstance abuse (HCC)    Prostate cancer (HCC)    Protein-calorie malnutrition, severe (HCC) 09/13/2016   Seizure (HCC) 12/2017   after head Injury   Thrombocytopenia [facility])     Surgical History: Past Surgical History:  Procedure Laterality Date   BURR HOLE Right 01/22/2018   Procedure: RIGHT BURR HOLES;  Surgeon: Tressie Stalker, MD;  Location: [facility] OR;  Service: Neurosurgery;  Laterality: Right;   GOLD SEED IMPLANT N/A 04/20/2021   Procedure: GOLD SEED IMPLANT;  Surgeon: Malen Gauze, MD;  [facility];  Service: Urology;  Laterality: N/A;   I & D EXTREMITY Left 11/06/2018   Procedure: IRRIGATION AND DEBRIDEMENT LEFT ELBOW JOINT;  Surgeon: Dairl Ponder, MD;  [facility] OR;  Service: Orthopedics;  Laterality: Left;   INCISION AND DRAINAGE Left 10/24/2018   Procedure: INCISION AND DRAINAGE LEFT ELBOW WITH MASS EXCISION;  Surgeon: Dairl Ponder, MD;  [facility] OR;  Service: Orthopedics;  Laterality: Left;   MULTIPLE TOOTH EXTRACTIONS     None     SPACE OAR INSTILLATION N/A 04/20/2021   Procedure: SPACE OAR INSTILLATION;  Surgeon: Malen Gauze, MD;  Location: [facility];  Service: Urology;  Laterality: N/A;    Home Medications:  Allergies as of  02/20/2023   No Known Allergies      Medication List        Accurate as of February 20, 2023 11:38 AM. If you have any questions, ask your nurse or doctor.          blood glucose meter kit and supplies Dispense based on patient and insurance preference. Use up to four times daily as directed. (FOR ICD-10 E10.9, E11.9).   clonazePAM 0.5 MG tablet Commonly known as: KLONOPIN Take 0.5 mg by mouth daily.   doxepin 75 MG capsule Commonly known as: SINEQUAN Take 75-150 mg by mouth at bedtime as needed (Sleep).   Easy Comfort Insulin Syringe 31G X 5/16" 1 ML Misc Generic drug: Insulin Syringe-Needle U-100 USE AS DIRECTED ONCE DAILY FOR LEVEMIR FLEXTOUCH 100 DAY SUPPLY   glipiZIDE 5 MG 24 hr tablet Commonly known as: GLUCOTROL XL Take 5 mg by mouth daily.   hydrOXYzine 25 MG tablet Commonly known as: ATARAX Take 1 tablet (25 mg total) by mouth 3 (three) times daily as needed for anxiety.   metFORMIN 500 MG tablet Commonly known as: GLUCOPHAGE Take 500 mg by mouth 2 (two) times daily.   phenazopyridine 200 MG tablet Commonly known as: PYRIDIUM Take 1 tablet (200 mg total) by mouth 3 (three) times daily as needed (Painful urination).   QUEtiapine 100 MG tablet  Commonly known as: SEROQUEL Take 100 mg by mouth at bedtime.   QUEtiapine 400 MG tablet Commonly known as: SEROQUEL Take 1 tablet (400 mg total) by mouth at bedtime. For mood control   sertraline 100 MG tablet Commonly known as: ZOLOFT Take 100 mg by mouth daily.        Allergies: No Known Allergies  Family History: Family History  Problem Relation Age of Onset   Thyroid disease Sister    Colon cancer Neg Hx    Colon polyps Neg Hx    Breast cancer Neg Hx    Prostate cancer Neg Hx    Pancreatic cancer Neg Hx     Social History:  reports that he has been smoking cigarettes. He has a 25 pack-year smoking history. He has never used smokeless tobacco. He reports that he does not currently use alcohol. He  reports that he does not currently use drugs after having used the following drugs: Marijuana and Cocaine.  ROS: All other review of systems were reviewed and are negative except what is noted above in HPI  Physical Exam: BP (!) 151/75   Pulse 71   Constitutional:  Alert and oriented, No acute distress. HEENT: [place] AT, moist mucus membranes.  Trachea midline, no masses. Cardiovascular: No clubbing, cyanosis, or edema. Respiratory: Normal respiratory effort, no increased work of breathing. GI: Abdomen is soft, nontender, nondistended, no abdominal masses GU: No CVA tenderness.  Lymph: No cervical or inguinal lymphadenopathy. Skin: No rashes, bruises or suspicious lesions. Neurologic: Grossly intact, no focal deficits, moving all 4 extremities. Psychiatric: Normal mood and affect.  Laboratory Data: Lab Results  Component Value Date   WBC 7.8 05/12/2021   HGB 14.8 05/12/2021   HCT 41.7 05/12/2021   MCV 90.8 05/12/2021   PLT 97 (L) 05/12/2021    Lab Results  Component Value Date   CREATININE 1.35 (H) 05/12/2021    No results found for: "PSA"  No results found for: "TESTOSTERONE"  Lab Results  Component Value Date   HGBA1C 5.8 (H) 01/30/2021    Urinalysis    Component Value Date/Time   COLORURINE YELLOW 02/02/2021 2053   APPEARANCEUR Clear 08/03/2022 1224   LABSPEC 1.012 02/02/2021 2053   PHURINE 6.0 02/02/2021 2053   GLUCOSEU Negative 08/03/2022 1224   HGBUR MODERATE (A) 02/02/2021 2053   BILIRUBINUR Negative 08/03/2022 1224   KETONESUR NEGATIVE 02/02/2021 2053   PROTEINUR 3+ (A) 08/03/2022 1224   PROTEINUR >=300 (A) 02/02/2021 2053   UROBILINOGEN 0.2 07/01/2014 2244   NITRITE Negative 08/03/2022 1224   NITRITE NEGATIVE 02/02/2021 2053   LEUKOCYTESUR Negative 08/03/2022 1224   LEUKOCYTESUR NEGATIVE 02/02/2021 2053    Lab Results  Component Value Date   LABMICR See below: 08/03/2022   WBCUA 0-5 08/03/2022   LABEPIT 0-10 08/03/2022   MUCUS Present  08/29/2020   BACTERIA None seen 08/03/2022    Pertinent Imaging:  No results found for this or any previous visit.  No results found for this or any previous visit.  No results found for this or any previous visit.  No results found for this or any previous visit.  No results found for this or any previous visit.  No valid procedures specified. No results found for this or any previous visit.  No results found for this or any previous visit.   Assessment & Plan:    1. Prostate cancer (HCC) -followup 6 months with PSA - Urinalysis, Routine w reflex microscopic  2. Weak urinary stream -resolved  3. Benign prostatic hyperplasia with urinary obstruction -resolved   No follow-ups on file.  Wilkie Aye, MD  [facility] [place]

## 2023-02-26 ENCOUNTER — Encounter: Payer: Self-pay | Admitting: Urology

## 2023-02-26 NOTE — Patient Instructions (Signed)

## 2023-04-26 DIAGNOSIS — I10 Essential (primary) hypertension: Secondary | ICD-10-CM | POA: Diagnosis not present

## 2023-04-26 DIAGNOSIS — I700 Atherosclerosis of aorta: Secondary | ICD-10-CM | POA: Diagnosis not present

## 2023-04-26 DIAGNOSIS — R52 Pain, unspecified: Secondary | ICD-10-CM | POA: Diagnosis not present

## 2023-04-26 DIAGNOSIS — R809 Proteinuria, unspecified: Secondary | ICD-10-CM | POA: Diagnosis not present

## 2023-04-26 DIAGNOSIS — Z299 Encounter for prophylactic measures, unspecified: Secondary | ICD-10-CM | POA: Diagnosis not present

## 2023-04-26 DIAGNOSIS — Z2821 Immunization not carried out because of patient refusal: Secondary | ICD-10-CM | POA: Diagnosis not present

## 2023-04-26 DIAGNOSIS — F17210 Nicotine dependence, cigarettes, uncomplicated: Secondary | ICD-10-CM | POA: Diagnosis not present

## 2023-04-26 DIAGNOSIS — E1129 Type 2 diabetes mellitus with other diabetic kidney complication: Secondary | ICD-10-CM | POA: Diagnosis not present

## 2023-04-26 DIAGNOSIS — E119 Type 2 diabetes mellitus without complications: Secondary | ICD-10-CM | POA: Diagnosis not present

## 2023-05-28 ENCOUNTER — Ambulatory Visit (INDEPENDENT_AMBULATORY_CARE_PROVIDER_SITE_OTHER): Payer: Medicare HMO | Admitting: Neurology

## 2023-05-28 ENCOUNTER — Encounter: Payer: Self-pay | Admitting: Neurology

## 2023-05-28 ENCOUNTER — Telehealth: Payer: Self-pay | Admitting: Neurology

## 2023-05-28 VITALS — BP 181/99 | HR 66 | Ht 67.0 in | Wt 192.5 lb

## 2023-05-28 DIAGNOSIS — S069X9S Unspecified intracranial injury with loss of consciousness of unspecified duration, sequela: Secondary | ICD-10-CM

## 2023-05-28 DIAGNOSIS — M24522 Contracture, left elbow: Secondary | ICD-10-CM | POA: Diagnosis not present

## 2023-05-28 DIAGNOSIS — M79602 Pain in left arm: Secondary | ICD-10-CM | POA: Diagnosis not present

## 2023-05-28 DIAGNOSIS — S069XAS Unspecified intracranial injury with loss of consciousness status unknown, sequela: Secondary | ICD-10-CM

## 2023-05-28 DIAGNOSIS — Z5181 Encounter for therapeutic drug level monitoring: Secondary | ICD-10-CM

## 2023-05-28 DIAGNOSIS — G40909 Epilepsy, unspecified, not intractable, without status epilepticus: Secondary | ICD-10-CM

## 2023-05-28 MED ORDER — LEVETIRACETAM 1000 MG PO TABS: 1000.0000 mg | ORAL_TABLET | Freq: Two times a day (BID) | ORAL | 3 refills | Status: DC
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
## 2023-05-28 NOTE — Patient Instructions (Signed)
Continue with Keppra 1000 mg twice daily, refill given  Routine EEG  Referral to home health for occupational therapy  Return in 6 to  months

## 2023-05-28 NOTE — Telephone Encounter (Signed)
[facility] will be taking this patient.

## 2023-05-29 LAB — LEVETIRACETAM LEVEL: Levetiracetam Lvl: 82.9 ug/mL — ABNORMAL HIGH (ref 10.0–40.0)

## 2023-05-29 LAB — VITAMIN D 25 HYDROXY (VIT D DEFICIENCY, FRACTURES): Vit D, 25-Hydroxy: 16.7 ng/mL — ABNORMAL LOW (ref 30.0–100.0)

## 2023-06-03 MED ORDER — VITAMIN D (ERGOCALCIFEROL) 1.25 MG (50000 UNIT) PO CAPS: 50000.0000 [IU] | ORAL_CAPSULE | ORAL | 0 refills | Status: DC

## 2023-06-03 NOTE — Addendum Note (Signed)
Addended byWindell Norfolk on: 06/03/2023 05:18 PM   Modules accepted: Orders

## 2023-06-03 NOTE — Progress Notes (Signed)
Please call and advise the patient that the recent Keppra level was high and his Vitamin D level was low. We won't adjust the medication now but we will check again at next visit. I will also start him on Vitamin D supplement to take weekly for the next 12 weeks. Please remind patient to keep any upcoming appointments or tests and to call us with any interim questions, concerns, problems or updates. Thanks,   Windell Norfolk, MD

## 2023-06-04 ENCOUNTER — Telehealth: Payer: Self-pay

## 2023-06-04 NOTE — Telephone Encounter (Signed)
-----   Message from [facility] sent at 06/03/2023  5:18 PM EST ----- Please call and advise the patient that the recent Keppra level was high and his Vitamin D level was low. We won't adjust the medication now but we will check again at next visit. I will also start him on Vitamin D supplement to take weekly for the next 12 weeks. Please remind patient to keep any upcoming appointments or tests and to call us with any interim questions, concerns, problems or updates. Thanks,   Windell Norfolk, MD

## 2023-06-04 NOTE — Telephone Encounter (Signed)
1st attempt [id number]

## 2023-06-05 DIAGNOSIS — G40401 Other generalized epilepsy and epileptic syndromes, not intractable, with status epilepticus: Secondary | ICD-10-CM | POA: Diagnosis not present

## 2023-06-05 DIAGNOSIS — E1101 Type 2 diabetes mellitus with hyperosmolarity with coma: Secondary | ICD-10-CM | POA: Diagnosis not present

## 2023-06-05 DIAGNOSIS — F319 Bipolar disorder, unspecified: Secondary | ICD-10-CM | POA: Diagnosis not present

## 2023-06-05 DIAGNOSIS — D696 Thrombocytopenia, unspecified: Secondary | ICD-10-CM | POA: Diagnosis not present

## 2023-06-05 DIAGNOSIS — E43 Unspecified severe protein-calorie malnutrition: Secondary | ICD-10-CM | POA: Diagnosis not present

## 2023-06-05 DIAGNOSIS — E1165 Type 2 diabetes mellitus with hyperglycemia: Secondary | ICD-10-CM | POA: Diagnosis not present

## 2023-06-05 DIAGNOSIS — G3184 Mild cognitive impairment, so stated: Secondary | ICD-10-CM | POA: Diagnosis not present

## 2023-06-05 DIAGNOSIS — K7030 Alcoholic cirrhosis of liver without ascites: Secondary | ICD-10-CM | POA: Diagnosis not present

## 2023-06-05 DIAGNOSIS — F419 Anxiety disorder, unspecified: Secondary | ICD-10-CM | POA: Diagnosis not present

## 2023-06-05 NOTE — Telephone Encounter (Signed)
Dorian, PT with [facility]([phone] vm secure) he is calling for verbal orders for 1-2 times per week up to 8 weeks if needed

## 2023-06-05 NOTE — Telephone Encounter (Signed)
Called and spoke w/dorian. Relayed the message from Dr. Teresa Coombs that the verbal orders are approved

## 2023-06-05 NOTE — Telephone Encounter (Signed)
Yes, ok with verbal orders. Thanks

## 2023-06-05 NOTE — Telephone Encounter (Signed)
Patient called.  Unable to reach patient.2nd attempt

## 2023-06-06 NOTE — Telephone Encounter (Signed)
Patient called.  Patient aware.  

## 2023-06-10 DIAGNOSIS — D696 Thrombocytopenia, unspecified: Secondary | ICD-10-CM | POA: Diagnosis not present

## 2023-06-10 DIAGNOSIS — E43 Unspecified severe protein-calorie malnutrition: Secondary | ICD-10-CM | POA: Diagnosis not present

## 2023-06-10 DIAGNOSIS — E1165 Type 2 diabetes mellitus with hyperglycemia: Secondary | ICD-10-CM | POA: Diagnosis not present

## 2023-06-10 DIAGNOSIS — G3184 Mild cognitive impairment, so stated: Secondary | ICD-10-CM | POA: Diagnosis not present

## 2023-06-10 DIAGNOSIS — G40401 Other generalized epilepsy and epileptic syndromes, not intractable, with status epilepticus: Secondary | ICD-10-CM | POA: Diagnosis not present

## 2023-06-10 DIAGNOSIS — F419 Anxiety disorder, unspecified: Secondary | ICD-10-CM | POA: Diagnosis not present

## 2023-06-10 DIAGNOSIS — E1101 Type 2 diabetes mellitus with hyperosmolarity with coma: Secondary | ICD-10-CM | POA: Diagnosis not present

## 2023-06-10 DIAGNOSIS — F319 Bipolar disorder, unspecified: Secondary | ICD-10-CM | POA: Diagnosis not present

## 2023-06-10 DIAGNOSIS — K7030 Alcoholic cirrhosis of liver without ascites: Secondary | ICD-10-CM | POA: Diagnosis not present

## 2023-06-11 DIAGNOSIS — G3184 Mild cognitive impairment, so stated: Secondary | ICD-10-CM | POA: Diagnosis not present

## 2023-06-11 DIAGNOSIS — D696 Thrombocytopenia, unspecified: Secondary | ICD-10-CM | POA: Diagnosis not present

## 2023-06-11 DIAGNOSIS — G40401 Other generalized epilepsy and epileptic syndromes, not intractable, with status epilepticus: Secondary | ICD-10-CM | POA: Diagnosis not present

## 2023-06-11 DIAGNOSIS — E1165 Type 2 diabetes mellitus with hyperglycemia: Secondary | ICD-10-CM | POA: Diagnosis not present

## 2023-06-11 DIAGNOSIS — K7030 Alcoholic cirrhosis of liver without ascites: Secondary | ICD-10-CM | POA: Diagnosis not present

## 2023-06-11 DIAGNOSIS — E1101 Type 2 diabetes mellitus with hyperosmolarity with coma: Secondary | ICD-10-CM | POA: Diagnosis not present

## 2023-06-11 DIAGNOSIS — F419 Anxiety disorder, unspecified: Secondary | ICD-10-CM | POA: Diagnosis not present

## 2023-06-11 DIAGNOSIS — F319 Bipolar disorder, unspecified: Secondary | ICD-10-CM | POA: Diagnosis not present

## 2023-06-11 DIAGNOSIS — E43 Unspecified severe protein-calorie malnutrition: Secondary | ICD-10-CM | POA: Diagnosis not present

## 2023-06-18 ENCOUNTER — Telehealth: Payer: Self-pay

## 2023-06-18 NOTE — Telephone Encounter (Signed)
Signed Plan of care faxed back to [facility]

## 2023-06-20 DIAGNOSIS — G40401 Other generalized epilepsy and epileptic syndromes, not intractable, with status epilepticus: Secondary | ICD-10-CM | POA: Diagnosis not present

## 2023-06-20 DIAGNOSIS — F319 Bipolar disorder, unspecified: Secondary | ICD-10-CM | POA: Diagnosis not present

## 2023-06-20 DIAGNOSIS — E1165 Type 2 diabetes mellitus with hyperglycemia: Secondary | ICD-10-CM | POA: Diagnosis not present

## 2023-06-20 DIAGNOSIS — E43 Unspecified severe protein-calorie malnutrition: Secondary | ICD-10-CM | POA: Diagnosis not present

## 2023-06-20 DIAGNOSIS — E1101 Type 2 diabetes mellitus with hyperosmolarity with coma: Secondary | ICD-10-CM | POA: Diagnosis not present

## 2023-06-20 DIAGNOSIS — F419 Anxiety disorder, unspecified: Secondary | ICD-10-CM | POA: Diagnosis not present

## 2023-06-20 DIAGNOSIS — G3184 Mild cognitive impairment, so stated: Secondary | ICD-10-CM | POA: Diagnosis not present

## 2023-06-20 DIAGNOSIS — K7030 Alcoholic cirrhosis of liver without ascites: Secondary | ICD-10-CM | POA: Diagnosis not present

## 2023-06-20 DIAGNOSIS — D696 Thrombocytopenia, unspecified: Secondary | ICD-10-CM | POA: Diagnosis not present

## 2023-06-21 DIAGNOSIS — F319 Bipolar disorder, unspecified: Secondary | ICD-10-CM | POA: Diagnosis not present

## 2023-06-21 DIAGNOSIS — G3184 Mild cognitive impairment, so stated: Secondary | ICD-10-CM | POA: Diagnosis not present

## 2023-06-21 DIAGNOSIS — E1101 Type 2 diabetes mellitus with hyperosmolarity with coma: Secondary | ICD-10-CM | POA: Diagnosis not present

## 2023-06-21 DIAGNOSIS — G40401 Other generalized epilepsy and epileptic syndromes, not intractable, with status epilepticus: Secondary | ICD-10-CM | POA: Diagnosis not present

## 2023-06-21 DIAGNOSIS — K7030 Alcoholic cirrhosis of liver without ascites: Secondary | ICD-10-CM | POA: Diagnosis not present

## 2023-06-21 DIAGNOSIS — F419 Anxiety disorder, unspecified: Secondary | ICD-10-CM | POA: Diagnosis not present

## 2023-06-21 DIAGNOSIS — D696 Thrombocytopenia, unspecified: Secondary | ICD-10-CM | POA: Diagnosis not present

## 2023-06-21 DIAGNOSIS — E1165 Type 2 diabetes mellitus with hyperglycemia: Secondary | ICD-10-CM | POA: Diagnosis not present

## 2023-06-21 DIAGNOSIS — E43 Unspecified severe protein-calorie malnutrition: Secondary | ICD-10-CM | POA: Diagnosis not present

## 2023-06-24 DIAGNOSIS — G40401 Other generalized epilepsy and epileptic syndromes, not intractable, with status epilepticus: Secondary | ICD-10-CM | POA: Diagnosis not present

## 2023-06-24 DIAGNOSIS — K7030 Alcoholic cirrhosis of liver without ascites: Secondary | ICD-10-CM | POA: Diagnosis not present

## 2023-06-24 DIAGNOSIS — G3184 Mild cognitive impairment, so stated: Secondary | ICD-10-CM | POA: Diagnosis not present

## 2023-06-24 DIAGNOSIS — D696 Thrombocytopenia, unspecified: Secondary | ICD-10-CM | POA: Diagnosis not present

## 2023-06-24 DIAGNOSIS — F419 Anxiety disorder, unspecified: Secondary | ICD-10-CM | POA: Diagnosis not present

## 2023-06-24 DIAGNOSIS — E1101 Type 2 diabetes mellitus with hyperosmolarity with coma: Secondary | ICD-10-CM | POA: Diagnosis not present

## 2023-06-24 DIAGNOSIS — E43 Unspecified severe protein-calorie malnutrition: Secondary | ICD-10-CM | POA: Diagnosis not present

## 2023-06-24 DIAGNOSIS — F319 Bipolar disorder, unspecified: Secondary | ICD-10-CM | POA: Diagnosis not present

## 2023-06-24 DIAGNOSIS — E1165 Type 2 diabetes mellitus with hyperglycemia: Secondary | ICD-10-CM | POA: Diagnosis not present

## 2023-06-25 DIAGNOSIS — F319 Bipolar disorder, unspecified: Secondary | ICD-10-CM | POA: Diagnosis not present

## 2023-06-25 DIAGNOSIS — E1101 Type 2 diabetes mellitus with hyperosmolarity with coma: Secondary | ICD-10-CM | POA: Diagnosis not present

## 2023-06-25 DIAGNOSIS — G3184 Mild cognitive impairment, so stated: Secondary | ICD-10-CM | POA: Diagnosis not present

## 2023-06-25 DIAGNOSIS — F419 Anxiety disorder, unspecified: Secondary | ICD-10-CM | POA: Diagnosis not present

## 2023-06-25 DIAGNOSIS — E43 Unspecified severe protein-calorie malnutrition: Secondary | ICD-10-CM | POA: Diagnosis not present

## 2023-06-25 DIAGNOSIS — D696 Thrombocytopenia, unspecified: Secondary | ICD-10-CM | POA: Diagnosis not present

## 2023-06-25 DIAGNOSIS — K7030 Alcoholic cirrhosis of liver without ascites: Secondary | ICD-10-CM | POA: Diagnosis not present

## 2023-06-25 DIAGNOSIS — E1165 Type 2 diabetes mellitus with hyperglycemia: Secondary | ICD-10-CM | POA: Diagnosis not present

## 2023-06-25 DIAGNOSIS — G40401 Other generalized epilepsy and epileptic syndromes, not intractable, with status epilepticus: Secondary | ICD-10-CM | POA: Diagnosis not present

## 2023-06-26 ENCOUNTER — Observation Stay (HOSPITAL_COMMUNITY)
Admission: EM | Admit: 2023-06-26 | Discharge: 2023-06-27 | Discharge status: Against Medical Advice | Payer: Medicare HMO | Attending: Family Medicine | Admitting: Family Medicine

## 2023-06-26 ENCOUNTER — Other Ambulatory Visit: Payer: Self-pay

## 2023-06-26 ENCOUNTER — Encounter (HOSPITAL_COMMUNITY): Payer: Self-pay | Admitting: Internal Medicine

## 2023-06-26 ENCOUNTER — Emergency Department (HOSPITAL_COMMUNITY): Payer: Medicare HMO

## 2023-06-26 DIAGNOSIS — E119 Type 2 diabetes mellitus without complications: Secondary | ICD-10-CM | POA: Diagnosis not present

## 2023-06-26 DIAGNOSIS — R569 Unspecified convulsions: Secondary | ICD-10-CM | POA: Insufficient documentation

## 2023-06-26 DIAGNOSIS — R410 Disorientation, unspecified: Secondary | ICD-10-CM | POA: Diagnosis not present

## 2023-06-26 DIAGNOSIS — Z5321 Procedure and treatment not carried out due to patient leaving prior to being seen by health care provider: Secondary | ICD-10-CM | POA: Diagnosis not present

## 2023-06-26 DIAGNOSIS — G9389 Other specified disorders of brain: Secondary | ICD-10-CM | POA: Diagnosis not present

## 2023-06-26 DIAGNOSIS — Z8546 Personal history of malignant neoplasm of prostate: Secondary | ICD-10-CM | POA: Diagnosis not present

## 2023-06-26 DIAGNOSIS — R4182 Altered mental status, unspecified: Secondary | ICD-10-CM | POA: Diagnosis not present

## 2023-06-26 DIAGNOSIS — G40909 Epilepsy, unspecified, not intractable, without status epilepticus: Secondary | ICD-10-CM | POA: Diagnosis not present

## 2023-06-26 DIAGNOSIS — Z79899 Other long term (current) drug therapy: Secondary | ICD-10-CM | POA: Insufficient documentation

## 2023-06-26 DIAGNOSIS — K7030 Alcoholic cirrhosis of liver without ascites: Secondary | ICD-10-CM | POA: Diagnosis not present

## 2023-06-26 DIAGNOSIS — I10 Essential (primary) hypertension: Secondary | ICD-10-CM | POA: Diagnosis present

## 2023-06-26 DIAGNOSIS — R531 Weakness: Secondary | ICD-10-CM | POA: Diagnosis not present

## 2023-06-26 DIAGNOSIS — F17200 Nicotine dependence, unspecified, uncomplicated: Secondary | ICD-10-CM

## 2023-06-26 DIAGNOSIS — F17210 Nicotine dependence, cigarettes, uncomplicated: Secondary | ICD-10-CM | POA: Insufficient documentation

## 2023-06-26 DIAGNOSIS — I6782 Cerebral ischemia: Secondary | ICD-10-CM | POA: Diagnosis not present

## 2023-06-26 LAB — PROTIME-INR
INR: 1.1 (ref 0.8–1.2)
Prothrombin Time: 14 s (ref 11.4–15.2)

## 2023-06-26 LAB — COMPREHENSIVE METABOLIC PANEL
ALT: 19 U/L (ref 0–44)
AST: 22 U/L (ref 15–41)
Albumin: 3.7 g/dL (ref 3.5–5.0)
Alkaline Phosphatase: 66 U/L (ref 38–126)
Anion gap: 9 (ref 5–15)
BUN: 35 mg/dL — ABNORMAL HIGH (ref 8–23)
CO2: 19 mmol/L — ABNORMAL LOW (ref 22–32)
Calcium: 9.2 mg/dL (ref 8.9–10.3)
Chloride: 108 mmol/L (ref 98–111)
Creatinine, Ser: 1.58 mg/dL — ABNORMAL HIGH (ref 0.61–1.24)
GFR, Estimated: 47 mL/min — ABNORMAL LOW (ref >60)
Glucose, Bld: 144 mg/dL — ABNORMAL HIGH (ref 70–99)
Potassium: 4 mmol/L (ref 3.5–5.1)
Sodium: 136 mmol/L (ref 135–145)
Total Bilirubin: 1.2 mg/dL — ABNORMAL HIGH (ref <1.2)
Total Protein: 7.3 g/dL (ref 6.5–8.1)

## 2023-06-26 LAB — CBC
HCT: 38.5 % — ABNORMAL LOW (ref 39.0–52.0)
Hemoglobin: 13.8 g/dL (ref 13.0–17.0)
MCH: 33 pg (ref 26.0–34.0)
MCHC: 35.8 g/dL (ref 30.0–36.0)
MCV: 92.1 fL (ref 80.0–100.0)
Platelets: 121 10*3/uL — ABNORMAL LOW (ref 150–400)
RBC: 4.18 MIL/uL — ABNORMAL LOW (ref 4.22–5.81)
RDW: 13.6 % (ref 11.5–15.5)
WBC: 9.3 10*3/uL (ref 4.0–10.5)
nRBC: 0 % (ref 0.0–0.2)

## 2023-06-26 LAB — I-STAT CHEM 8, ED
BUN: 35 mg/dL — ABNORMAL HIGH (ref 8–23)
Calcium, Ion: 1.1 mmol/L — ABNORMAL LOW (ref 1.15–1.40)
Chloride: 109 mmol/L (ref 98–111)
Creatinine, Ser: 1.7 mg/dL — ABNORMAL HIGH (ref 0.61–1.24)
Glucose, Bld: 141 mg/dL — ABNORMAL HIGH (ref 70–99)
HCT: 38 % — ABNORMAL LOW (ref 39.0–52.0)
Hemoglobin: 12.9 g/dL — ABNORMAL LOW (ref 13.0–17.0)
Potassium: 4.1 mmol/L (ref 3.5–5.1)
Sodium: 138 mmol/L (ref 135–145)
TCO2: 19 mmol/L — ABNORMAL LOW (ref 22–32)

## 2023-06-26 LAB — VITAMIN B12: Vitamin B-12: 589 pg/mL (ref 180–914)

## 2023-06-26 LAB — DIFFERENTIAL
Abs Immature Granulocytes: 0.04 10*3/uL (ref 0.00–0.07)
Basophils Absolute: 0 10*3/uL (ref 0.0–0.1)
Basophils Relative: 0 %
Eosinophils Absolute: 0.1 10*3/uL (ref 0.0–0.5)
Eosinophils Relative: 1 %
Immature Granulocytes: 0 %
Lymphocytes Relative: 20 %
Lymphs Abs: 1.9 10*3/uL (ref 0.7–4.0)
Monocytes Absolute: 0.7 10*3/uL (ref 0.1–1.0)
Monocytes Relative: 7 %
Neutro Abs: 6.6 10*3/uL (ref 1.7–7.7)
Neutrophils Relative %: 72 %

## 2023-06-26 LAB — CBG MONITORING, ED: Glucose-Capillary: 137 mg/dL — ABNORMAL HIGH (ref 70–99)

## 2023-06-26 LAB — GLUCOSE, CAPILLARY: Glucose-Capillary: 161 mg/dL — ABNORMAL HIGH (ref 70–99)

## 2023-06-26 LAB — TSH: TSH: 2.67 u[IU]/mL (ref 0.350–4.500)

## 2023-06-26 LAB — ETHANOL: Alcohol, Ethyl (B): <10 mg/dL (ref <10)

## 2023-06-26 LAB — AMMONIA: Ammonia: 40 umol/L — ABNORMAL HIGH (ref 9–35)

## 2023-06-26 LAB — APTT: aPTT: 26 s (ref 24–36)

## 2023-06-26 MED ORDER — LORAZEPAM 2 MG/ML IJ SOLN
0.5000 mg | Freq: Once | INTRAMUSCULAR | Status: AC
  Administered 2023-06-26: 0.5 mg via INTRAVENOUS
  Filled 2023-06-26: qty 1

## 2023-06-26 MED ORDER — ATORVASTATIN CALCIUM 40 MG PO TABS
80.0000 mg | ORAL_TABLET | Freq: Every day | ORAL | Status: DC
  Administered 2023-06-26: 80 mg via ORAL
  Filled 2023-06-26: qty 2

## 2023-06-26 MED ORDER — LISINOPRIL 10 MG PO TABS
20.0000 mg | ORAL_TABLET | Freq: Every day | ORAL | Status: DC
  Administered 2023-06-26: 20 mg via ORAL
  Filled 2023-06-26: qty 2

## 2023-06-26 MED ORDER — ONDANSETRON HCL 4 MG/2ML IJ SOLN: 4.0000 mg | Freq: Four times a day (QID) | INTRAMUSCULAR | Status: DC | PRN

## 2023-06-26 MED ORDER — LEVETIRACETAM IN NACL 500 MG/100ML IV SOLN
500.0000 mg | Freq: Once | INTRAVENOUS | Status: AC
  Administered 2023-06-26: 500 mg via INTRAVENOUS
  Filled 2023-06-26: qty 100

## 2023-06-26 MED ORDER — SODIUM CHLORIDE 0.9 % IV SOLN
1250.0000 mg | Freq: Two times a day (BID) | INTRAVENOUS | Status: DC
  Filled 2023-06-26 (×3): qty 12.5

## 2023-06-26 MED ORDER — IOHEXOL 350 MG/ML SOLN: 75.0000 mL | Freq: Once | INTRAVENOUS | Status: DC | PRN

## 2023-06-26 MED ORDER — POLYETHYLENE GLYCOL 3350 17 G PO PACK: 17.0000 g | PACK | Freq: Every day | ORAL | Status: DC | PRN

## 2023-06-26 MED ORDER — ONDANSETRON HCL 4 MG PO TABS: 4.0000 mg | ORAL_TABLET | Freq: Four times a day (QID) | ORAL | Status: DC | PRN

## 2023-06-26 MED ORDER — ENOXAPARIN SODIUM 40 MG/0.4ML IJ SOSY
40.0000 mg | PREFILLED_SYRINGE | INTRAMUSCULAR | Status: DC
  Administered 2023-06-26: 40 mg via SUBCUTANEOUS
  Filled 2023-06-26: qty 0.4

## 2023-06-26 NOTE — ED Provider Notes (Signed)
[facility] AT [facility] Provider Note   CSN: [id number] Arrival date & time: 06/26/23  1412     History  Chief Complaint  Patient presents with   Code Stroke    Juan Juarez is a 67 y.o. male.  Patient presents with confusion this morning.  Patient has a history of seizures.  With subdural hematoma.  He also has some contractures in that left arm from the subdural.  The history is provided by the patient and the EMS personnel. No language interpreter was used.  Altered Mental Status Presenting symptoms: behavior changes and confusion   Severity:  Moderate Most recent episode:  Today Episode history:  Continuous Timing:  Constant Progression:  Worsening Chronicity:  New Context: not alcohol use   Associated symptoms: no abdominal pain, no hallucinations, no headaches, no rash and no seizures        Home Medications Prior to Admission medications   Medication Sig Start Date End Date Taking? Authorizing Provider  blood glucose meter kit and supplies Dispense based on patient and insurance preference. Use up to four times daily as directed. (FOR ICD-10 E10.9, E11.9). 09/03/18   Johnson, Clanford L, MD  clonazePAM (KLONOPIN) 0.5 MG tablet Take 0.5 mg by mouth daily. 03/17/21   [provider]  levETIRAcetam (KEPPRA) 1000 MG tablet Take 1 tablet (1,000 mg total) by mouth 2 (two) times daily. 05/28/23 05/22/24  Windell Norfolk, MD  lisinopril (ZESTRIL) 20 MG tablet Take 20 mg by mouth daily.    [provider]  Vitamin D, Ergocalciferol, (DRISDOL) 1.25 MG (50000 UNIT) CAPS capsule Take 1 capsule (50,000 Units total) by mouth every 7 (seven) days. 06/03/23   Windell Norfolk, MD      Allergies    Patient has no known allergies.    Review of Systems   Review of Systems  Constitutional:  Negative for appetite change and fatigue.  HENT:  Negative for congestion, ear discharge and sinus pressure.   Eyes:  Negative for discharge.   Respiratory:  Negative for cough.   Cardiovascular:  Negative for chest pain.  Gastrointestinal:  Negative for abdominal pain and diarrhea.  Genitourinary:  Negative for frequency and hematuria.  Musculoskeletal:  Negative for back pain.  Skin:  Negative for rash.  Neurological:  Negative for seizures and headaches.  Psychiatric/Behavioral:  Positive for confusion. Negative for hallucinations.     Physical Exam Updated Vital Signs BP (!) 144/71   Pulse 73   Temp 97.7 F (36.5 C) (Oral)   Resp 13   SpO2 100%  Physical Exam Vitals and nursing note reviewed.  Constitutional:      Appearance: He is well-developed.  HENT:     Head: Normocephalic.     Nose: Nose normal.  Eyes:     General: No scleral icterus.    Conjunctiva/sclera: Conjunctivae normal.  Neck:     Thyroid: No thyromegaly.  Cardiovascular:     Rate and Rhythm: Normal rate and regular rhythm.     Heart sounds: No murmur heard.    No friction rub. No gallop.  Pulmonary:     Breath sounds: No stridor. No wheezing or rales.  Chest:     Chest wall: No tenderness.  Abdominal:     General: There is no distension.     Tenderness: There is no abdominal tenderness. There is no rebound.  Musculoskeletal:        General: Normal range of motion.     Cervical back: Neck  supple.     Comments: Weakness in left arm from contractures secondary to history of subdural  Lymphadenopathy:     Cervical: No cervical adenopathy.  Skin:    Findings: No erythema or rash.  Neurological:     Mental Status: He is alert.     Motor: No abnormal muscle tone.     Coordination: Coordination normal.     Comments: Oriented to person place only     ED Results / Procedures / Treatments   Labs (all labs ordered are listed, but only abnormal results are displayed) Labs Reviewed  CBC - Abnormal; Notable for the following components:      Result Value   RBC 4.18 (*)    HCT 38.5 (*)    Platelets 121 (*)    All other components within  normal limits  COMPREHENSIVE METABOLIC PANEL - Abnormal; Notable for the following components:   CO2 19 (*)    Glucose, Bld 144 (*)    BUN 35 (*)    Creatinine, Ser 1.58 (*)    Total Bilirubin 1.2 (*)    GFR, Estimated 47 (*)    All other components within normal limits  CBG MONITORING, ED - Abnormal; Notable for the following components:   Glucose-Capillary 137 (*)    All other components within normal limits  I-STAT CHEM 8, ED - Abnormal; Notable for the following components:   BUN 35 (*)    Creatinine, Ser 1.70 (*)    Glucose, Bld 141 (*)    Calcium, Ion 1.10 (*)    TCO2 19 (*)    Hemoglobin 12.9 (*)    HCT 38.0 (*)    All other components within normal limits  ETHANOL  PROTIME-INR  APTT  DIFFERENTIAL  RAPID URINE DRUG SCREEN, HOSP PERFORMED  URINALYSIS, ROUTINE W REFLEX MICROSCOPIC    EKG None  Radiology CT HEAD CODE STROKE WO CONTRAST Result Date: 06/26/2023 CLINICAL DATA:  Code stroke. Provided history: Neuro deficit, acute, stroke suspected. Left-sided weakness. EXAM: CT HEAD WITHOUT CONTRAST TECHNIQUE: Contiguous axial images were obtained from the base of the skull through the vertex without intravenous contrast. RADIATION DOSE REDUCTION: This exam was performed according to the departmental dose-optimization program which includes automated exposure control, adjustment of the mA and/or kV according to patient size and/or use of iterative reconstruction technique. COMPARISON:  Head CT 12/06/2022.  Brain MRI 07/08/2020. FINDINGS: Brain: Cerebral atrophy with asymmetrically prominent right cerebral volume loss (most notably involving portions of the right temporal lobe, right insula and right frontal operculum). Foci of chronic encephalomalacia are also questioned at these sites. Ex vacuo dilatation of the right lateral ventricle. Mild cerebral white matter chronic small ischemic disease, better appreciated on the prior brain MRI of 07/08/2020. 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 There is no acute intracranial hemorrhage. No acute demarcated cortical infarct. No extra-axial fluid collection. No evidence of an intracranial mass. No midline shift. Vascular: No hyperdense vessel.  Atherosclerotic calcifications. Skull: Burr holes within the right frontoparietal calvarium and within the right proc calvarium more posteriorly. No calvarial fracture or aggressive osseous lesion. Sinuses/Orbits: No mass or acute finding within the imaged orbits. No significant paranasal sinus disease at the imaged levels. ASPECTS [facility] Stroke Program Early CT Score) - Ganglionic level infarction (caudate, lentiform nuclei, internal capsule, insula, M1-M3 cortex): 7 - Supraganglionic infarction (M4-M6 cortex): 3 Total score (0-10 with 10 being normal): 10 (when discounting potential sites of chronic cortical encephalomalacia).  No evidence of an acute intracranial abnormality. These results were called by telephone at the time of interpretation on 06/26/2023 at 2:48 pm to provider [facility] Theordore Cisnero , who verbally acknowledged these results. IMPRESSION: 1. No evidence of an acute intracranial abnormality. 2. Cerebral atrophy with asymmetrically prominent right cerebral volume loss (most notably involving portions of the right temporal lobe, right insula and right frontoparietal operculum). Superimposed foci of chronic encephalomalacia also questioned at these sites. Associated ex vacuo dilatation of the right lateral ventricle. 3. Mild chronic small vessel ischemic changes within the cerebral white matter. 4. 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 Electronically Signed   By: Jackey Loge D.O.   On: 06/26/2023 14:49    Procedures Procedures    Medications Ordered in ED Medications  iohexol (OMNIPAQUE) 350 MG/ML injection 75 mL (has no administration in time range)  levETIRAcetam (KEPPRA) IVPB 500 mg/100 mL premix  (500 mg Intravenous New Bag/Given 06/26/23 1506)    ED Course/ Medical Decision Making/ A&P   CRITICAL CARE Performed by: Bethann Berkshire Total critical care time: 33 minutes Critical care time was exclusive of separately billable procedures and treating other patients. Critical care was necessary to treat or prevent imminent or life-threatening deterioration. Critical care was time spent personally by me on the following activities: development of treatment plan with patient and/or surrogate as well as nursing, discussions with consultants, evaluation of patient's response to treatment, examination of patient, obtaining history from patient or surrogate, ordering and performing treatments and interventions, ordering and review of laboratory studies, ordering and review of radiographic studies, pulse oximetry and re-evaluation of patient's condition.   Code stroke was called.  Patient was seen by neurology.  They believe this may be related to a seizure.  Neurology would like to increase his Keppra to 1250 twice a day and admit him to medicine for observation Click here for ABCD2, HEART and other calculatorsREFRESH Note before signing :1}                              Medical Decision Making Amount and/or Complexity of Data Reviewed Labs: ordered. Radiology: ordered.  Risk Prescription drug management. Decision regarding hospitalization.  This patient presents to the ED for concern of confusion, this involves an extensive number of treatment options, and is a complaint that carries with it a high risk of complications and morbidity.  The differential diagnosis includes stroke, seizure   Co morbidities that complicate the patient evaluation  Seizure and previous subdural   Additional history obtained:  Additional history obtained from patient and EMS External records from outside source obtained and reviewed including hospital records   Lab Tests:  I Ordered, and personally  interpreted labs.  The pertinent results include: White count 9.3, hemoglobin 13.8   Imaging Studies ordered:  I ordered imaging studies including CT head I independently visualized and interpreted imaging which showed no acute stroke I agree with the radiologist interpretation   Cardiac Monitoring: / EKG:  The patient was maintained on a cardiac monitor.  I personally viewed and interpreted the cardiac monitored which showed an underlying rhythm of: Normal sinus rhythm   Consultations Obtained:  I requested consultation with the neurologist and hospitalist,  and discussed lab and imaging findings as well as pertinent plan - they recommend: Neurologist consult with hospitalist admission   Problem List / ED Course / Critical interventions / Medication management  Altered mental status Keppra for possible seizure Reevaluation of the patient after these medicines showed that the patient stayed the same I have reviewed the patients home medicines and have made adjustments as needed   Social Determinants of Health:  None   Test / Admission - Considered:  None  Altered mental status.  Most likely postictal from seizure.  Patient will be admitted to medicine        Final Clinical Impression(s) / ED Diagnoses Final diagnoses:  None    Rx / DC Orders ED Discharge Orders     None         Bethann Berkshire, MD 06/28/23 1020

## 2023-06-26 NOTE — Progress Notes (Signed)
Telestroke cart was activated at 1412. Per treatment team, pt's LKW was at 2200 last night with c/o AMS. [facility] was paged for code stroke at 1413. Dr. Thad Ranger, [facility] appeared on telestroke cart at 1416 to assess the patient. Pt transported to CT at 1421. Based on [facility]'s assessment, pt does not meet criteria for emergent interventions at this time 2/2 onset known greater than 4.5 hours. [facility] to f/u with EDP regarding recommendations. No further needs from Telestroke RN at this time. Telestroke cart disconnected at 1439 while pt was still in CT.  mRS- 1

## 2023-06-26 NOTE — Progress Notes (Signed)
   06/26/23 2056  Hand-off documentation  Hand-off Received Received from shift RN/LPN  Report received from (Full Name) Morrie Sheldon RN

## 2023-06-26 NOTE — Assessment & Plan Note (Addendum)
??   Postictal state. Encephalopathy presenting with confusion.  History of seizures.  Reports jerking of his left upper extremity yesterday.  Head CT negative for acute abnormality.  Keppra level 05/22/2023 was elevated at 82.9.  Reports compliance with Keppra -Neurology consulted Dr. 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 Suspect current presentation is postictal.  -MRI brain recommended due to change in morphology of his seizure -Following neurology recs- IV Keppra 500 x 1, continue 1250 twice daily -Check Keppra level -UDS pending  -Follow-up UA - Blood alcohol level unremarkable -Check ammonia level -Check Continue B12, folate, TSH

## 2023-06-26 NOTE — ED Triage Notes (Signed)
Pt arrived via [facility] code stroke called in the field 1405. Last known normal was 10pm lastnight. Only Sx is confusion. Equal grip strengths, face symmetrical, Hx of seizures on Keppra, states his L arm was jerking lastnight and is wondering if he had a seizure this morning as well. States he did take his medication this morning. Pt is currently A&O x3

## 2023-06-26 NOTE — Assessment & Plan Note (Addendum)
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  Follows with neurologist Dr. Teresa Coombs.  Per notes he is on 1000 mg twice daily of Keppra. Last visit- 11/26-Keppra level drawn was high, dose was not adjusted, but he was to follow-up for recheck at next visit.  On that visit 11/26 he reported feeling tired and felt like he had had a seizure 2 nights prior. - ??uncontrolled seizures

## 2023-06-26 NOTE — Assessment & Plan Note (Signed)
Stable, not decompensated.  Platelets 121.  Close to baseline.

## 2023-06-26 NOTE — ED Notes (Signed)
While doing neuro assessment right pupil was non reactive and left pupil was pinpoint, EDP notified "nothing to worry about".

## 2023-06-26 NOTE — H&P (Addendum)
History and Physical    Washington Georgiev NWG:[id number] DOB: [birth year] DOA: 06/26/2023  PCP: Ignatius Specking, MD   Patient coming from: Home  I have personally briefly reviewed patient's old medical records in [facility]  Chief Complaint: Confusion  HPI: Juan Juarez is a 67 y.o. male with medical history significant for seizure disorder, alcoholic liver disease, bipolar disorder, cocaine abuse, subdural hematoma, diabetes mellitus, hypertension. Patient was brought to the ED via EMS with reports of altered mental status.  At time of my evaluation, patient is awake, but he is quite anxious, he is able to answer some questions, but appears he is intermittently confused.  He reports jerking of his left upper extremity yesterday.  He lives alone. Patient reports he woke up this morning confused.  He denies weakness of his extremities.  Reports chills over the past few days, he reports good oral intake, no urinary or respiratory symptoms, no vomiting no diarrhea. He tells me he wanted to have Christmas dinner with his sisters and 2 sons today. He has a history of subdural hematoma left upper extremity contracture.  He tells me he had blood work recently (per chart - 05/28/2023)and his Keppra level was elevated.  His dose was not adjusted he was to follow-up for recheck  ED Course: Tmax 97.9.  Heart rate 60s to 70s.  Respiratory rate 12-18, blood pressure systolic 120s to 578I.  O2 sats greater than 92% on room. Teleneurology consulted- Although does not recall focal onset of his seizures, based on his history and right hemispheric encephalomalacia he likely has complex partial seizures with a quick generalization. Suspect current  presentation is postictal.  Recommended IV 500 mg Keppra now and increase maintenance dose to 1250 twice daily.  Due to change in seizure morphology, MRI brain recommended.  Review of Systems: As per HPI all other systems reviewed and negative.  Past Medical  History:  Diagnosis Date   Anxiety    Chronic elbow pain, left    Depression    Diabetes mellitus, type II (HCC)    type II   Gout    Head injury 12/2017   Hepatitis C without hepatic coma    Hypertension    denies   Inflammatory arthropathy    left elbow   Insomnia    Non compliance w medication regimen    Osteoarthritis    Polysubstance abuse (HCC)    Prostate cancer (HCC)    Protein-calorie malnutrition, severe (HCC) 09/13/2016   Seizure (HCC) 12/2017   after head Injury   Thrombocytopenia [facility])     Past Surgical History:  Procedure Laterality Date   BURR HOLE Right 01/22/2018   Procedure: RIGHT BURR HOLES;  Surgeon: Tressie Stalker, MD;  Location: [facility] OR;  Service: Neurosurgery;  Laterality: Right;   GOLD SEED IMPLANT N/A 04/20/2021   Procedure: GOLD SEED IMPLANT;  Surgeon: Malen Gauze, MD;  Location: [facility] ORS;  Service: Urology;  Laterality: N/A;   I & D EXTREMITY Left 11/06/2018   Procedure: IRRIGATION AND DEBRIDEMENT LEFT ELBOW JOINT;  Surgeon: Dairl Ponder, MD;  Location: [facility] OR;  Service: Orthopedics;  Laterality: Left;   INCISION AND DRAINAGE Left 10/24/2018   Procedure: INCISION AND DRAINAGE LEFT ELBOW WITH MASS EXCISION;  Surgeon: Dairl Ponder, MD;  Location: [facility] OR;  Service: Orthopedics;  Laterality: Left;   MULTIPLE TOOTH EXTRACTIONS     None     SPACE OAR INSTILLATION N/A 04/20/2021   Procedure: SPACE OAR INSTILLATION;  Surgeon: Wilkie Aye  L, MD;  Location: [facility] ORS;  Service: Urology;  Laterality: N/A;     reports that he has been smoking cigarettes. He has a 25 pack-year smoking history. He has never used smokeless tobacco. He reports that he does not currently use alcohol. He reports that he does not currently use drugs after having used the following drugs: Marijuana and Cocaine.  No Known Allergies  Family History  Problem Relation Age of Onset   Thyroid disease Sister    Colon cancer Neg Hx    Colon polyps Neg Hx    Breast cancer  Neg Hx    Prostate cancer Neg Hx    Pancreatic cancer Neg Hx    Prior to Admission medications   Medication Sig Start Date End Date Taking? Authorizing Provider  atorvastatin (LIPITOR) 80 MG tablet Take 80 mg by mouth daily. 05/28/23  Yes [provider]  thiamine (VITAMIN B-1) 50 MG tablet Take 50 mg by mouth daily.   Yes [provider]  blood glucose meter kit and supplies Dispense based on patient and insurance preference. Use up to four times daily as directed. (FOR ICD-10 E10.9, E11.9). 09/03/18   Johnson, Clanford L, MD  clonazePAM (KLONOPIN) 0.5 MG tablet Take 0.5 mg by mouth daily. 03/17/21  Yes [provider]  levETIRAcetam (KEPPRA) 1000 MG tablet Take 1 tablet (1,000 mg total) by mouth 2 (two) times daily. 05/28/23 05/22/24 Yes Camara, Amalia Hailey, MD  lisinopril (ZESTRIL) 20 MG tablet Take 20 mg by mouth daily.   Yes [provider]    Physical Exam: Vitals:   06/26/23 1715 06/26/23 1815 06/26/23 1816 06/26/23 1830  BP:  (!) 154/82  (!) 137/94  Pulse: 72     Resp: 18  16   Temp:   97.9 F (36.6 C)   TempSrc:   Oral   SpO2: (!) 87%  99%     Constitutional: NAD, calm, comfortable Vitals:   06/26/23 1715 06/26/23 1815 06/26/23 1816 06/26/23 1830  BP:  (!) 154/82  (!) 137/94  Pulse: 72     Resp: 18  16   Temp:   97.9 F (36.6 C)   TempSrc:   Oral   SpO2: (!) 87%  99%    Eyes:  lids and conjunctivae normal ENMT: Mucous membranes are moist.   Neck: normal, supple, no masses, no thyromegaly Respiratory: clear to auscultation bilaterally, no wheezing, no crackles. Normal respiratory effort. No accessory muscle use.  Cardiovascular: Regular rate and rhythm, no murmurs / rubs / gallops. No extremity edema. 2+ pedal pulses. No carotid bruits.  Abdomen: no tenderness, no masses palpated. No hepatosplenomegaly. Bowel sounds positive.  Musculoskeletal: no clubbing / cyanosis. No joint deformity upper and lower extremities.  Skin: no rashes,  lesions, ulcers. No induration Neurologic: No facial asymmetry, speech fluent, 4+/5 strength bilateral lower extremity, good and equal grip strength bilateral upper extremity.  Sensation intact. Psychiatric: Anxious, alert and oriented x 3.  But speech appears intermittently confused.  Labs on Admission: I have personally reviewed following labs and imaging studies  CBC: Recent Labs  Lab 06/26/23 1419 06/26/23 1420  WBC 9.3  --   NEUTROABS 6.6  --   HGB 13.8 12.9*  HCT 38.5* 38.0*  MCV 92.1  --   PLT 121*  --    Basic Metabolic Panel: Recent Labs  Lab 06/26/23 1419 06/26/23 1420  NA 136 138  K 4.0 4.1  CL 108 109  CO2 19*  --   GLUCOSE 144*  141*  BUN 35* 35*  CREATININE 1.58* 1.70*  CALCIUM 9.2  --    GFR: CrCl cannot be calculated (Unknown ideal weight.). Liver Function Tests: Recent Labs  Lab 06/26/23 1419  AST 22  ALT 19  ALKPHOS 66  BILITOT 1.2*  PROT 7.3  ALBUMIN 3.7   Coagulation Profile: Recent Labs  Lab 06/26/23 1419  INR 1.1   CBG: Recent Labs  Lab 06/26/23 1415  GLUCAP 137*    Radiological Exams on Admission: CT HEAD CODE STROKE WO CONTRAST Result Date: 06/26/2023 CLINICAL DATA:  Code stroke. Provided history: Neuro deficit, acute, stroke suspected. Left-sided weakness. EXAM: CT HEAD WITHOUT CONTRAST TECHNIQUE: Contiguous axial images were obtained from the base of the skull through the vertex without intravenous contrast. RADIATION DOSE REDUCTION: This exam was performed according to the departmental dose-optimization program which includes automated exposure control, adjustment of the mA and/or kV according to patient size and/or use of iterative reconstruction technique. COMPARISON:  Head CT 12/06/2022.  Brain MRI 07/08/2020. FINDINGS: Brain: Cerebral atrophy with asymmetrically prominent right cerebral volume loss (most notably involving portions of the right temporal lobe, right insula and right frontal operculum). Foci of chronic  encephalomalacia are also questioned at these sites. Ex vacuo dilatation of the right lateral ventricle. Mild cerebral white matter chronic small ischemic disease, better appreciated on the prior brain MRI of 07/08/2020. 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 There is no acute intracranial hemorrhage. No acute demarcated cortical infarct. No extra-axial fluid collection. No evidence of an intracranial mass. No midline shift. Vascular: No hyperdense vessel.  Atherosclerotic calcifications. Skull: Burr holes within the right frontoparietal calvarium and within the right proc calvarium more posteriorly. No calvarial fracture or aggressive osseous lesion. Sinuses/Orbits: No mass or acute finding within the imaged orbits. No significant paranasal sinus disease at the imaged levels. ASPECTS [facility] Stroke Program Early CT Score) - Ganglionic level infarction (caudate, lentiform nuclei, internal capsule, insula, M1-M3 cortex): 7 - Supraganglionic infarction (M4-M6 cortex): 3 Total score (0-10 with 10 being normal): 10 (when discounting potential sites of chronic cortical encephalomalacia). No evidence of an acute intracranial abnormality. These results were called by telephone at the time of interpretation on 06/26/2023 at 2:48 pm to provider [facility] ZAMMIT , who verbally acknowledged these results. IMPRESSION: 1. No evidence of an acute intracranial abnormality. 2. Cerebral atrophy with asymmetrically prominent right cerebral volume loss (most notably involving portions of the right temporal lobe, right insula and right frontoparietal operculum). Superimposed foci of chronic encephalomalacia also questioned at these sites. Associated ex vacuo dilatation of the right lateral ventricle. 3. Mild chronic small vessel ischemic changes within the cerebral white matter. 4. 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 Electronically Signed   By:  Jackey Loge D.O.   On: 06/26/2023 14:49    EKG: Independently reviewed.  Sinus rhythm, rate 70, QTc 433.  No significant change from prior.  Assessment/Plan Principal Problem:   AMS (altered mental status) Active Problems:   Essential hypertension, benign   Alcoholic cirrhosis of liver without ascites (HCC)   Seizure disorder (HCC)   Diabetes (HCC)  Assessment and Plan: * AMS (altered mental status) ?? Postictal state. Encephalopathy presenting with confusion.  History of seizures.  Reports jerking of his left upper extremity yesterday.  Head CT negative for acute abnormality.  Keppra level 05/22/2023 was elevated at 82.9.  Reports compliance with Keppra -Neurology consulted Dr. Thad Ranger -  although does not recall focal onset of his seizures, based on his history and right hemispheric encephalomalacia he likely has complex partial seizures with a quick generalization. Suspect current presentation is postictal.  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  Follows with neurologist Dr. Teresa Coombs.  Per notes he is on 1000 mg twice daily of Keppra. Last visit- 11/26-Keppra level drawn was high, dose was not adjusted, but he was to follow-up for recheck at next visit.  On that visit 11/26 he reported feeling tired and felt like he had had a seizure 2 nights prior. - ??uncontrolled seizures  Alcoholic cirrhosis of liver without ascites (HCC) Stable, not decompensated.  Platelets 121.  Close to baseline.  Essential hypertension, benign Stable. -Resume lisinopril 20 mg daily   DVT  prophylaxis: Lovenox Code Status: FULL  Family Communication: None at bedside Disposition Plan: ~ 1- 2 days Consults called: neurology Admission status:  Obs Tele    Author: Onnie Boer, MD 06/26/2023 7:46 PM  For on call review [email or web address].

## 2023-06-26 NOTE — Consult Note (Signed)
[facility] Telemedicine Consult   Requesting Provider: Estell Harpin Consult Participants: Telespecialist nurse, [facility] nurse Location of the provider: [place], [place] Location of the patient: [facility] ED  This consult was provided via telemedicine with 2-way video and audio communication. The patient was informed that care would be provided in this way and agreed to receive care in this manner.    Chief Complaint: Confusion  HPI: 68 year old male with a history of DM, right SDH, seizures on Keppra and prn Klonopin, HTN and polysubstance abuse who presents altered.  Patient reports going to bed at baseline and awakening today confused.  Reports bring compliant with medications.  Reports seizures as generalized and does not recall any focal onset or presentation. Residual from SDH evacuation is left hemianopsia and LUE contracture   LKW: 06/25/2023 @ 2200 tpa given?: No, Outside time window IR Thrombectomy? No, Low likelihood of acute infarct Modified Rankin Scale: 1-No significant post stroke disability and can perform usual duties with stroke symptoms Time of teleneurologist evaluation: 1417  Exam: Vitals:   06/26/23 1421  BP: (!) 169/79  Pulse: 73  Resp: 17  Temp: 97.7 F (36.5 C)  SpO2: 100%    General: NAD, confused  1A: Level of Consciousness - 0 1B: Ask Month and Age - 1 1C: 'Blink Eyes' & 'Squeeze Hands' - 0 2: Test Horizontal Extraocular Movements - 0 3: Test Visual Fields - 1 4: Test Facial Palsy - 0 5A: Test Left Arm Motor Drift - 1 5B: Test Right Arm Motor Drift - 0 6A: Test Left Leg Motor Drift - 0 6B: Test Right Leg Motor Drift - 0 7: Test Limb Ataxia - 0 8: Test Sensation - 0 9: Test Language/Aphasia- 0 10: Test Dysarthria - 1 11: Test Extinction/Inattention - 0 NIHSS score: 4   Imaging Reviewed:  CT HEAD WITHOUT CONTRAST   TECHNIQUE: Contiguous axial images were obtained from the base of the skull through the vertex without intravenous contrast.    RADIATION DOSE REDUCTION: This exam was performed according to the departmental dose-optimization program which includes automated exposure control, adjustment of the mA and/or kV according to patient size and/or use of iterative reconstruction technique.   COMPARISON:  Head CT 12/06/2022.  Brain MRI 07/08/2020.   FINDINGS: Brain:   Cerebral atrophy with asymmetrically prominent right cerebral volume loss (most notably involving portions of the right temporal lobe, right insula and right frontal operculum). Foci of chronic encephalomalacia are also questioned at these sites. Ex vacuo dilatation of the right lateral ventricle.   Mild cerebral white matter chronic small ischemic disease, better appreciated on the prior brain MRI of 07/08/2020.   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   There is no acute intracranial hemorrhage.   No acute demarcated cortical infarct.   No extra-axial fluid collection.   No evidence of an intracranial mass.   No midline shift.   Vascular: No hyperdense vessel.  Atherosclerotic calcifications.   Skull: Burr holes within the right frontoparietal calvarium and within the right proc calvarium more posteriorly. No calvarial fracture or aggressive osseous lesion.   Sinuses/Orbits: No mass or acute finding within the imaged orbits. No significant paranasal sinus disease at the imaged levels.   ASPECTS [facility] Stroke Program Early CT Score)   - Ganglionic level infarction (caudate, lentiform nuclei, internal capsule, insula, M1-M3 cortex): 7   - Supraganglionic infarction (M4-M6 cortex): 3   Total score (0-10 with 10 being normal): 10 (when  discounting potential sites of chronic cortical encephalomalacia).   No evidence of an acute intracranial abnormality. These results were called by telephone at the time of interpretation on 06/26/2023 at 2:48 pm to provider [facility] ZAMMIT , who verbally  acknowledged these results.   IMPRESSION: 1. No evidence of an acute intracranial abnormality. 2. Cerebral atrophy with asymmetrically prominent right cerebral volume loss (most notably involving portions of the right temporal lobe, right insula and right frontoparietal operculum). Superimposed foci of chronic encephalomalacia also questioned at these sites. Associated ex vacuo dilatation of the right lateral ventricle. 3. Mild chronic small vessel ischemic changes within the cerebral white matter. 4. 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  Labs reviewed in epic and pertinent values follow: CBG 137   Assessment: 67 year old male with a history of DM, right SDH, seizures on Keppra and prn Klonopin, HTN and polysubstance abuse who presents altered.  Patient reports going to bed at baseline and awakening today confused.  Reports bring compliant with medications.  Reports seizures as generalized and does not recall any focal onset or presentation. Residual from SDH evacuation is left hemianopsia and LUE contracture.  Patient with no focality on his current examination outside of his residual.  Although does not recall focal onset of his seizures, based on his history and right hemispheric encephalomalacia he likely has complex partial seizures with a quick generalization.  Suspect current presentation is postictal.  Patient reports medication compliance.   Head CT personally reviewed and shows no acute changes.    Recommendations:  Seizure precautions Keppra 500mg  IV now Increase maintenance Keppra to 1250mg  BID Due to possible change in seizure morphology patient should have MRI of the brain with and without contrast    This patient is receiving care for possible acute neurological changes. There was 45 minutes of care by this provider at the time of service, including time for direct evaluation via telemedicine, review of medical records,  imaging studies and discussion of findings with providers.  Thana Farr, MD Neurology  Case discussed with Dr. Estell Harpin   If 7pm- 7am, please page neurology on call as listed in AMION.

## 2023-06-26 NOTE — ED Notes (Signed)
Pt is now resting without incident.

## 2023-06-26 NOTE — Assessment & Plan Note (Signed)
Last A1c 5.4- 6 months ago, not on medication

## 2023-06-26 NOTE — ED Notes (Signed)
Daughter Wilkie Aye called and updated her that patient was being admitted.

## 2023-06-26 NOTE — Assessment & Plan Note (Signed)
Stable. -Resume lisinopril 20 mg daily

## 2023-06-26 NOTE — ED Notes (Signed)
Previous Hx of L elbow/ arm mobility and dexterity due to MRSA and surgical intervention on that arm

## 2023-06-26 NOTE — ED Notes (Signed)
Pt expressed concerns with receiving keppra in ed, states that his previous levels last month were elevated, pt's concerns discussed with Dr Estell Harpin who advised to continue with dose of keppra while in ed. Pt expressed understanding,

## 2023-06-26 NOTE — ED Notes (Signed)
CBG 137 

## 2023-06-27 ENCOUNTER — Observation Stay (HOSPITAL_COMMUNITY)
Admission: RE | Admit: 2023-06-27 | Discharge: 2023-06-27 | Disposition: A | Discharge status: Home / Self Care | Payer: Medicare HMO | Source: Ambulatory Visit | Attending: Internal Medicine | Admitting: Internal Medicine

## 2023-06-27 ENCOUNTER — Encounter (HOSPITAL_COMMUNITY): Payer: Self-pay | Admitting: Radiology

## 2023-06-27 LAB — URINALYSIS, ROUTINE W REFLEX MICROSCOPIC
Bacteria, UA: NONE SEEN
Bilirubin Urine: NEGATIVE
Glucose, UA: 50 mg/dL — AB
Ketones, ur: NEGATIVE mg/dL
Leukocytes,Ua: NEGATIVE
Nitrite: NEGATIVE
Protein, ur: >=300 mg/dL — AB
Specific Gravity, Urine: 1.016 (ref 1.005–1.030)
pH: 5 (ref 5.0–8.0)

## 2023-06-27 LAB — RAPID URINE DRUG SCREEN, HOSP PERFORMED
Amphetamines: NOT DETECTED
Barbiturates: NOT DETECTED
Benzodiazepines: NOT DETECTED
Cocaine: NOT DETECTED
Opiates: NOT DETECTED
Tetrahydrocannabinol: NOT DETECTED

## 2023-06-27 LAB — FOLATE: Folate: 24.4 ng/mL (ref >5.9)

## 2023-06-27 LAB — HIV ANTIBODY (ROUTINE TESTING W REFLEX): HIV Screen 4th Generation wRfx: NONREACTIVE

## 2023-06-27 NOTE — Progress Notes (Signed)
Left message with Zella Ball to have French Ana, RN to give MRI a call.

## 2023-06-27 NOTE — Discharge Summary (Signed)
Physician Discharge Summary   Patient: Juan Juarez MRN: [id number] DOB: June 09, 1956  Admit date:     06/26/2023  Discharge date: 06/27/23  Discharge Physician: Kendell Bane   PCP: Ignatius Specking, MD     Left AGAINST MEDICAL ADVICE Eloped Please see note from Dr. Dolly Rias   Discharge Diagnoses: Principal Problem:   AMS (altered mental status) Active Problems:   Essential hypertension, benign   Alcoholic cirrhosis of liver without ascites (HCC)   Seizure disorder (HCC)   Diabetes (HCC)  Resolved Problems:   * No resolved hospital problems. *  Hospital Course: No notes on file  Assessment and Plan: * AMS (altered mental status) ?? Postictal state. Encephalopathy presenting with confusion.  History of seizures.  Reports jerking of his left upper extremity yesterday.  Head CT negative for acute abnormality.  Keppra level 05/22/2023 was elevated at 82.9.  Reports compliance with Keppra -Neurology consulted Dr. 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 Suspect current presentation is postictal.  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  Follows with neurologist Dr. Teresa Coombs.  Per notes he is on 1000 mg twice daily of Keppra. Last visit- 11/26-Keppra level drawn was high, dose was not adjusted, but he was to follow-up for recheck at next visit.  On that visit 11/26 he reported feeling tired and felt like he had had a  seizure 2 nights prior. - ??uncontrolled seizures  Alcoholic cirrhosis of liver without ascites (HCC) Stable, not decompensated.  Platelets 121.  Close to baseline.  Essential hypertension, benign Stable. -Resume lisinopril 20 mg daily          Consultants: Teleneurology  Diet recommendation:  Discharge Diet Orders (From admission, onward)     Start     Ordered   06/27/23 0000  Diet - low sodium heart healthy        06/27/23 0926            DISCHARGE MEDICATION: Allergies as of 06/27/2023   No Known Allergies      Medication List     TAKE these medications    atorvastatin 80 MG tablet Commonly known as: LIPITOR Take 80 mg by mouth daily.   blood glucose meter kit and supplies Dispense based on patient and insurance preference. Use up to four times daily as directed. (FOR ICD-10 E10.9, E11.9).   clonazePAM 0.5 MG tablet Commonly known as: KLONOPIN Take 0.5 mg by mouth daily.   levETIRAcetam 1000 MG tablet Commonly known as: KEPPRA Take 1 tablet (1,000 mg total) by mouth 2 (two) times daily.   lisinopril 20 MG tablet Commonly known as: ZESTRIL Take 20 mg by mouth daily.   thiamine 50 MG tablet Commonly known as: VITAMIN B-1 Take 50 mg by mouth daily.        Discharge Exam: Filed Weights   06/26/23 2033  Weight: 83.1 kg     Condition at discharge: fair  The results of significant diagnostics from this hospitalization (including imaging, microbiology, ancillary  and laboratory) are listed below for reference.   Imaging Studies: CT HEAD CODE STROKE WO CONTRAST Result Date: 06/26/2023 CLINICAL DATA:  Code stroke. Provided history: Neuro deficit, acute, stroke suspected. Left-sided weakness. EXAM: CT HEAD WITHOUT CONTRAST TECHNIQUE: Contiguous axial images were obtained from the base of the skull through the vertex without intravenous contrast. RADIATION DOSE REDUCTION: This exam was performed according to the departmental dose-optimization  program which includes automated exposure control, adjustment of the mA and/or kV according to patient size and/or use of iterative reconstruction technique. COMPARISON:  Head CT 12/06/2022.  Brain MRI 07/08/2020. FINDINGS: Brain: Cerebral atrophy with asymmetrically prominent right cerebral volume loss (most notably involving portions of the right temporal lobe, right insula and right frontal operculum). Foci of chronic encephalomalacia are also questioned at these sites. Ex vacuo dilatation of the right lateral ventricle. Mild cerebral white matter chronic small ischemic disease, better appreciated on the prior brain MRI of 07/08/2020. 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 There is no acute intracranial hemorrhage. No acute demarcated cortical infarct. No extra-axial fluid collection. No evidence of an intracranial mass. No midline shift. Vascular: No hyperdense vessel.  Atherosclerotic calcifications. Skull: Burr holes within the right frontoparietal calvarium and within the right proc calvarium more posteriorly. No calvarial fracture or aggressive osseous lesion. Sinuses/Orbits: No mass or acute finding within the imaged orbits. No significant paranasal sinus disease at the imaged levels. ASPECTS [facility] Stroke Program Early CT Score) - Ganglionic level infarction (caudate, lentiform nuclei, internal capsule, insula, M1-M3 cortex): 7 - Supraganglionic infarction (M4-M6 cortex): 3 Total score (0-10 with 10 being normal): 10 (when discounting potential sites of chronic cortical encephalomalacia). No evidence of an acute intracranial abnormality. These results were called by telephone at the time of interpretation on 06/26/2023 at 2:48 pm to provider [facility] ZAMMIT , who verbally acknowledged these results. IMPRESSION: 1. No evidence of an acute intracranial abnormality. 2. Cerebral atrophy with asymmetrically prominent right cerebral volume loss (most notably  involving portions of the right temporal lobe, right insula and right frontoparietal operculum). Superimposed foci of chronic encephalomalacia also questioned at these sites. Associated ex vacuo dilatation of the right lateral ventricle. 3. Mild chronic small vessel ischemic changes within the cerebral white matter. 4. 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 Electronically Signed   By: Jackey Loge D.O.   On: 06/26/2023 14:49    Microbiology: Results for orders placed or performed in visit on 02/20/23  Microscopic Examination     Status: Abnormal   Collection Time: 02/20/23 11:16 AM   Urine  Result Value Ref Range Status   WBC, UA 0-5 0 - 5 /hpf Final   RBC, Urine 3-10 (A) 0 - 2 /hpf Final   Epithelial Cells (non renal) 0-10 0 - 10 /hpf Final   Bacteria, UA None seen None seen/Few Final    Labs: CBC: Recent Labs  Lab 06/26/23 1419 06/26/23 1420  WBC 9.3  --   NEUTROABS 6.6  --   HGB 13.8 12.9*  HCT 38.5* 38.0*  MCV 92.1  --   PLT 121*  --    Basic Metabolic Panel: Recent Labs  Lab 06/26/23 1419 06/26/23 1420  NA 136 138  K 4.0 4.1  CL 108 109  CO2 19*  --   GLUCOSE 144* 141*  BUN 35* 35*  CREATININE 1.58* 1.70*  CALCIUM 9.2  --    Liver Function Tests: Recent  Labs  Lab 06/26/23 1419  AST 22  ALT 19  ALKPHOS 66  BILITOT 1.2*  PROT 7.3  ALBUMIN 3.7   CBG: Recent Labs  Lab 06/26/23 1415 06/26/23 2020  GLUCAP 137* 161*    Discharge time spent: greater than 30 minutes.  Signed: Kendell Bane, MD [facility] 06/27/2023

## 2023-06-27 NOTE — Progress Notes (Signed)
Have tried multiple times to get RN for patient to get information to get MRI done. No RN assigned to patient in epic.

## 2023-06-27 NOTE — Plan of Care (Signed)

## 2023-06-27 NOTE — Significant Event (Addendum)
Notified by RN staff that patient attempting to leave against medical advice. On assessment he is in a wheelchair. Wants to leave to smoke a cigarette, and also feels we haven't done anything for him. I offered him aggressive treatment for nicotine cravings but he declined. He was not entirely clear on events leading up to his hospitalization. I expressed that we are worried he may have had a seizure and that he is to have an evaluation with MRI. He is "worried about the radiation" and does not want an MRI. Discussed that we are working with neurology to adjust his AED dosing. He appears to understand that he may have had a seizure "I have them every other day". And he states he will followup with his neurologist. He understates the risk of leaving the hospital "theres no risk". He is oriented fully with exception is slightly off on year "2023", otherwise oriented to time "December 26th". When expressing that I need to assess his capacity to leave the hospital, he became agitated and made threatening statements towards staff. He was agitated that we tried to have him sit in a wheelchair to leave the hospital for his protection. He said "you want to see me protect myself give me a gun"; Feel this is a statement of potential violence against staff. Overall feel he has capacity but poor decision making, allowed to go against advice. I advised him to followup with neurology and he states he will do so.   Dolly Rias, MD   [facility]

## 2023-06-27 NOTE — Progress Notes (Signed)
RN is at bedside trying to keep the pt inside the room as he is requesting to leave AMA. MD is at bedside to assess with this RN. Pt is requesting to leave. Pt pulled IV out and is refusing tele. Pt is alert and oriented to self, place, says it is dec 2023, and situation. Pt states he is okay and denies any need to stay here. On call provider is educating at this time.

## 2023-06-27 NOTE — Progress Notes (Signed)
French Ana, RN called back to say patient had left AMA.

## 2023-06-28 DIAGNOSIS — M25529 Pain in unspecified elbow: Secondary | ICD-10-CM | POA: Diagnosis not present

## 2023-06-28 DIAGNOSIS — M79603 Pain in arm, unspecified: Secondary | ICD-10-CM | POA: Diagnosis not present

## 2023-06-28 DIAGNOSIS — R5381 Other malaise: Secondary | ICD-10-CM | POA: Diagnosis not present

## 2023-06-28 LAB — LEVETIRACETAM LEVEL: Levetiracetam Lvl: 32.9 ug/mL (ref 10.0–40.0)

## 2023-07-01 DIAGNOSIS — E1101 Type 2 diabetes mellitus with hyperosmolarity with coma: Secondary | ICD-10-CM | POA: Diagnosis not present

## 2023-07-01 DIAGNOSIS — D696 Thrombocytopenia, unspecified: Secondary | ICD-10-CM | POA: Diagnosis not present

## 2023-07-01 DIAGNOSIS — K7030 Alcoholic cirrhosis of liver without ascites: Secondary | ICD-10-CM | POA: Diagnosis not present

## 2023-07-01 DIAGNOSIS — F419 Anxiety disorder, unspecified: Secondary | ICD-10-CM | POA: Diagnosis not present

## 2023-07-01 DIAGNOSIS — F319 Bipolar disorder, unspecified: Secondary | ICD-10-CM | POA: Diagnosis not present

## 2023-07-01 DIAGNOSIS — G40401 Other generalized epilepsy and epileptic syndromes, not intractable, with status epilepticus: Secondary | ICD-10-CM | POA: Diagnosis not present

## 2023-07-01 DIAGNOSIS — E43 Unspecified severe protein-calorie malnutrition: Secondary | ICD-10-CM | POA: Diagnosis not present

## 2023-07-01 DIAGNOSIS — E1165 Type 2 diabetes mellitus with hyperglycemia: Secondary | ICD-10-CM | POA: Diagnosis not present

## 2023-07-01 DIAGNOSIS — G3184 Mild cognitive impairment, so stated: Secondary | ICD-10-CM | POA: Diagnosis not present

## 2023-07-05 DIAGNOSIS — E1101 Type 2 diabetes mellitus with hyperosmolarity with coma: Secondary | ICD-10-CM | POA: Diagnosis not present

## 2023-07-05 DIAGNOSIS — G40401 Other generalized epilepsy and epileptic syndromes, not intractable, with status epilepticus: Secondary | ICD-10-CM | POA: Diagnosis not present

## 2023-07-05 DIAGNOSIS — F319 Bipolar disorder, unspecified: Secondary | ICD-10-CM | POA: Diagnosis not present

## 2023-07-05 DIAGNOSIS — D696 Thrombocytopenia, unspecified: Secondary | ICD-10-CM | POA: Diagnosis not present

## 2023-07-05 DIAGNOSIS — E1165 Type 2 diabetes mellitus with hyperglycemia: Secondary | ICD-10-CM | POA: Diagnosis not present

## 2023-07-05 DIAGNOSIS — K7030 Alcoholic cirrhosis of liver without ascites: Secondary | ICD-10-CM | POA: Diagnosis not present

## 2023-07-05 DIAGNOSIS — F419 Anxiety disorder, unspecified: Secondary | ICD-10-CM | POA: Diagnosis not present

## 2023-07-05 DIAGNOSIS — E43 Unspecified severe protein-calorie malnutrition: Secondary | ICD-10-CM | POA: Diagnosis not present

## 2023-07-05 DIAGNOSIS — G3184 Mild cognitive impairment, so stated: Secondary | ICD-10-CM | POA: Diagnosis not present

## 2023-07-12 DIAGNOSIS — F419 Anxiety disorder, unspecified: Secondary | ICD-10-CM | POA: Diagnosis not present

## 2023-07-12 DIAGNOSIS — G3184 Mild cognitive impairment, so stated: Secondary | ICD-10-CM | POA: Diagnosis not present

## 2023-07-12 DIAGNOSIS — K7030 Alcoholic cirrhosis of liver without ascites: Secondary | ICD-10-CM | POA: Diagnosis not present

## 2023-07-12 DIAGNOSIS — E1165 Type 2 diabetes mellitus with hyperglycemia: Secondary | ICD-10-CM | POA: Diagnosis not present

## 2023-07-12 DIAGNOSIS — E1101 Type 2 diabetes mellitus with hyperosmolarity with coma: Secondary | ICD-10-CM | POA: Diagnosis not present

## 2023-07-12 DIAGNOSIS — D696 Thrombocytopenia, unspecified: Secondary | ICD-10-CM | POA: Diagnosis not present

## 2023-07-12 DIAGNOSIS — E43 Unspecified severe protein-calorie malnutrition: Secondary | ICD-10-CM | POA: Diagnosis not present

## 2023-07-12 DIAGNOSIS — G40401 Other generalized epilepsy and epileptic syndromes, not intractable, with status epilepticus: Secondary | ICD-10-CM | POA: Diagnosis not present

## 2023-07-12 DIAGNOSIS — F319 Bipolar disorder, unspecified: Secondary | ICD-10-CM | POA: Diagnosis not present

## 2023-07-15 ENCOUNTER — Other Ambulatory Visit: Payer: Medicare HMO | Admitting: *Deleted

## 2023-07-17 DIAGNOSIS — E43 Unspecified severe protein-calorie malnutrition: Secondary | ICD-10-CM | POA: Diagnosis not present

## 2023-07-17 DIAGNOSIS — F319 Bipolar disorder, unspecified: Secondary | ICD-10-CM | POA: Diagnosis not present

## 2023-07-17 DIAGNOSIS — E1165 Type 2 diabetes mellitus with hyperglycemia: Secondary | ICD-10-CM | POA: Diagnosis not present

## 2023-07-17 DIAGNOSIS — G40401 Other generalized epilepsy and epileptic syndromes, not intractable, with status epilepticus: Secondary | ICD-10-CM | POA: Diagnosis not present

## 2023-07-17 DIAGNOSIS — K7030 Alcoholic cirrhosis of liver without ascites: Secondary | ICD-10-CM | POA: Diagnosis not present

## 2023-07-17 DIAGNOSIS — G3184 Mild cognitive impairment, so stated: Secondary | ICD-10-CM | POA: Diagnosis not present

## 2023-07-17 DIAGNOSIS — D696 Thrombocytopenia, unspecified: Secondary | ICD-10-CM | POA: Diagnosis not present

## 2023-07-17 DIAGNOSIS — E1101 Type 2 diabetes mellitus with hyperosmolarity with coma: Secondary | ICD-10-CM | POA: Diagnosis not present

## 2023-07-17 DIAGNOSIS — F419 Anxiety disorder, unspecified: Secondary | ICD-10-CM | POA: Diagnosis not present

## 2023-07-30 DIAGNOSIS — F419 Anxiety disorder, unspecified: Secondary | ICD-10-CM | POA: Diagnosis not present

## 2023-07-30 DIAGNOSIS — F319 Bipolar disorder, unspecified: Secondary | ICD-10-CM | POA: Diagnosis not present

## 2023-07-30 DIAGNOSIS — D696 Thrombocytopenia, unspecified: Secondary | ICD-10-CM | POA: Diagnosis not present

## 2023-07-30 DIAGNOSIS — K7030 Alcoholic cirrhosis of liver without ascites: Secondary | ICD-10-CM | POA: Diagnosis not present

## 2023-07-30 DIAGNOSIS — G40401 Other generalized epilepsy and epileptic syndromes, not intractable, with status epilepticus: Secondary | ICD-10-CM | POA: Diagnosis not present

## 2023-07-30 DIAGNOSIS — E1165 Type 2 diabetes mellitus with hyperglycemia: Secondary | ICD-10-CM | POA: Diagnosis not present

## 2023-07-30 DIAGNOSIS — E1101 Type 2 diabetes mellitus with hyperosmolarity with coma: Secondary | ICD-10-CM | POA: Diagnosis not present

## 2023-07-30 DIAGNOSIS — G3184 Mild cognitive impairment, so stated: Secondary | ICD-10-CM | POA: Diagnosis not present

## 2023-07-30 DIAGNOSIS — E43 Unspecified severe protein-calorie malnutrition: Secondary | ICD-10-CM | POA: Diagnosis not present

## 2023-08-01 ENCOUNTER — Telehealth: Payer: Self-pay | Admitting: Neurology

## 2023-08-01 ENCOUNTER — Ambulatory Visit (INDEPENDENT_AMBULATORY_CARE_PROVIDER_SITE_OTHER): Payer: Medicare HMO | Admitting: Neurology

## 2023-08-01 ENCOUNTER — Other Ambulatory Visit: Payer: Self-pay | Admitting: Neurology

## 2023-08-01 DIAGNOSIS — G40909 Epilepsy, unspecified, not intractable, without status epilepticus: Secondary | ICD-10-CM | POA: Diagnosis not present

## 2023-08-01 DIAGNOSIS — S069XAS Unspecified intracranial injury with loss of consciousness status unknown, sequela: Secondary | ICD-10-CM

## 2023-08-01 MED ORDER — LEVETIRACETAM 750 MG PO TABS: 1500.0000 mg | ORAL_TABLET | Freq: Two times a day (BID) | ORAL | 3 refills | Status: DC

## 2023-08-01 MED ORDER — LACOSAMIDE 100 MG PO TABS: 100.0000 mg | ORAL_TABLET | Freq: Two times a day (BID) | ORAL | 5 refills | Status: DC

## 2023-08-01 NOTE — Telephone Encounter (Signed)
Call to daughter, no answer. EEG was completed 07/31/23. No results to report yet. Unable to leave voicemail.

## 2023-08-01 NOTE — Progress Notes (Signed)
EEG with right sided seizure. Will increase Keppra and add Vimpat

## 2023-08-01 NOTE — Telephone Encounter (Signed)
Please give patient's daughter, Silva Bandy (listed on DPR) a call with EEG results

## 2023-08-01 NOTE — Progress Notes (Signed)
Spoke with daughter, discussed EEG results showing 1 seizure coming from the right side of his brain. We will increase his Keppra to 1500 mg twice daily and add Vimpat 100 mg BID. Advised them to contact me for any additional questions or concerns. I will see them as scheduled in June.

## 2023-08-01 NOTE — Procedures (Signed)
    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 Electrical activity was reviewed with band pass filter of 1-70Hz , sensitivity of 7 uV/mm, display speed of 70mm/sec with a 60Hz  notched filter applied as appropriate. EEG data were recorded continuously and digitally stored.   Description of the recording: The background rhythms of this recording consists of a fairly well modulated medium amplitude alpha rhythm of 9 Hz that is reactive to eye opening and closure. Present in the anterior head region is a 15-20 Hz beta activity. Photic stimulation was performed, did not show any abnormalities. Hyperventilation was also performed, did not show any abnormalities. Drowsiness was not seen. There were presence of right sided LPDs. There was continuous right hemispheric slowing. There were one electrographic seizure starting in the right temporal region identified.   Abnormality:  Right temporal electrographic seizure captured. Seizure start with fast activity in the right posterior region, then followed with delta rhythmic discharges lasting  1 min and 10 second follow by diffuse slowing. During the diffuse slowing period, patient lateralized periodic discharges.  Right sided lateralized periodic discharges  Continuous right hemispheric slowing   Impression: This is an abnormal awake and drowsy EEG due to one electrographic seizure starting in the right posterior quadrant, right sided epileptiform discharges and continuous right hemispheric slowing.     Windell Norfolk, MD [facility]

## 2023-08-23 ENCOUNTER — Other Ambulatory Visit: Payer: Medicare HMO

## 2023-08-28 ENCOUNTER — Ambulatory Visit: Payer: Medicare HMO | Admitting: Urology

## 2023-10-25 DIAGNOSIS — E1165 Type 2 diabetes mellitus with hyperglycemia: Secondary | ICD-10-CM | POA: Diagnosis not present

## 2023-10-25 DIAGNOSIS — R52 Pain, unspecified: Secondary | ICD-10-CM | POA: Diagnosis not present

## 2023-10-25 DIAGNOSIS — I700 Atherosclerosis of aorta: Secondary | ICD-10-CM | POA: Diagnosis not present

## 2023-10-25 DIAGNOSIS — Z299 Encounter for prophylactic measures, unspecified: Secondary | ICD-10-CM | POA: Diagnosis not present

## 2023-10-25 DIAGNOSIS — F17210 Nicotine dependence, cigarettes, uncomplicated: Secondary | ICD-10-CM | POA: Diagnosis not present

## 2023-10-25 DIAGNOSIS — G4700 Insomnia, unspecified: Secondary | ICD-10-CM | POA: Diagnosis not present

## 2023-10-25 DIAGNOSIS — I10 Essential (primary) hypertension: Secondary | ICD-10-CM | POA: Diagnosis not present

## 2023-11-26 DIAGNOSIS — I10 Essential (primary) hypertension: Secondary | ICD-10-CM | POA: Diagnosis not present

## 2023-11-26 DIAGNOSIS — R0902 Hypoxemia: Secondary | ICD-10-CM | POA: Diagnosis not present

## 2023-11-26 DIAGNOSIS — S40862A Insect bite (nonvenomous) of left upper arm, initial encounter: Secondary | ICD-10-CM | POA: Diagnosis not present

## 2023-11-26 DIAGNOSIS — M109 Gout, unspecified: Secondary | ICD-10-CM | POA: Diagnosis not present

## 2023-11-26 DIAGNOSIS — W57XXXA Bitten or stung by nonvenomous insect and other nonvenomous arthropods, initial encounter: Secondary | ICD-10-CM | POA: Diagnosis not present

## 2023-11-26 DIAGNOSIS — F419 Anxiety disorder, unspecified: Secondary | ICD-10-CM | POA: Diagnosis not present

## 2023-11-26 DIAGNOSIS — F32A Depression, unspecified: Secondary | ICD-10-CM | POA: Diagnosis not present

## 2023-11-26 DIAGNOSIS — Z7982 Long term (current) use of aspirin: Secondary | ICD-10-CM | POA: Diagnosis not present

## 2023-11-26 DIAGNOSIS — F1919 Other psychoactive substance abuse with unspecified psychoactive substance-induced disorder: Secondary | ICD-10-CM | POA: Diagnosis not present

## 2023-11-26 DIAGNOSIS — R609 Edema, unspecified: Secondary | ICD-10-CM | POA: Diagnosis not present

## 2023-11-26 DIAGNOSIS — T7840XA Allergy, unspecified, initial encounter: Secondary | ICD-10-CM | POA: Diagnosis not present

## 2023-11-26 DIAGNOSIS — M1990 Unspecified osteoarthritis, unspecified site: Secondary | ICD-10-CM | POA: Diagnosis not present

## 2023-11-26 DIAGNOSIS — F17210 Nicotine dependence, cigarettes, uncomplicated: Secondary | ICD-10-CM | POA: Diagnosis not present

## 2023-12-26 ENCOUNTER — Ambulatory Visit (INDEPENDENT_AMBULATORY_CARE_PROVIDER_SITE_OTHER): Payer: Medicare HMO | Admitting: Neurology

## 2023-12-26 ENCOUNTER — Encounter: Payer: Self-pay | Admitting: Neurology

## 2023-12-26 VITALS — BP 134/74 | Ht 67.0 in | Wt 177.5 lb

## 2023-12-26 DIAGNOSIS — M24522 Contracture, left elbow: Secondary | ICD-10-CM | POA: Diagnosis not present

## 2023-12-26 DIAGNOSIS — S069XAS Unspecified intracranial injury with loss of consciousness status unknown, sequela: Secondary | ICD-10-CM

## 2023-12-26 DIAGNOSIS — G40909 Epilepsy, unspecified, not intractable, without status epilepticus: Secondary | ICD-10-CM | POA: Diagnosis not present

## 2023-12-26 DIAGNOSIS — Z5181 Encounter for therapeutic drug level monitoring: Secondary | ICD-10-CM | POA: Diagnosis not present

## 2023-12-26 DIAGNOSIS — M79602 Pain in left arm: Secondary | ICD-10-CM

## 2023-12-26 DIAGNOSIS — S069X9S Unspecified intracranial injury with loss of consciousness of unspecified duration, sequela: Secondary | ICD-10-CM | POA: Diagnosis not present

## 2023-12-26 NOTE — Patient Instructions (Signed)
 Continue with levetiracetam  1500 mg twice daily Continue with Vimpat  100 mg twice daily Will check her levetiracetam  and Vimpat  level today Routine EEG, I will contact you to go over the results, and will adjust antiseizure medications if necessary Continue with exercise for the left upper extremity Follow-up in 6 months or sooner if worse.
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
## 2024-01-01 ENCOUNTER — Ambulatory Visit (INDEPENDENT_AMBULATORY_CARE_PROVIDER_SITE_OTHER): Admitting: Neurology

## 2024-01-01 DIAGNOSIS — G40909 Epilepsy, unspecified, not intractable, without status epilepticus: Secondary | ICD-10-CM | POA: Diagnosis not present

## 2024-01-01 DIAGNOSIS — S069XAS Unspecified intracranial injury with loss of consciousness status unknown, sequela: Secondary | ICD-10-CM

## 2024-01-01 NOTE — Procedures (Signed)
   History:  68 year old gentleman with seizure disorder   EEG classification:  Awake and asleep  Duration: 25 minutes   Technical aspects: This EEG study was done with scalp electrodes positioned according to the 10-20 International system of electrode placement. Electrical activity was reviewed with band pass filter of 1-70Hz , sensitivity of 7 uV/mm, display speed of 5mm/sec with a 60Hz  notched filter applied as appropriate. EEG data were recorded continuously and digitally stored.   Description of the recording: The background rhythms of this recording consists of a fairly well modulated medium amplitude background activity of 9 Hz. As the record progresses, the patient initially is in the waking state, but appears to enter the early stage II sleep during the recording, with rudimentary sleep spindles and vertex sharp wave activity seen. During the wakeful state, photic stimulation was performed, and no abnormal responses were seen. Hyperventilation was not performed. There were frequent right temporoparietal sharp and slow waves. There was continuous right hemispheric slowing.    Abnormality: Frequent right temporo-parietal epileptiform discharges  Continuous right hemispheric slowing    Impression: This is an abnormal awake and sleep EEG due to presence of right temporo-parietal epileptiform discharges which is consistent with an area of increase epileptogenic potential in the right temporo-parietal region. In addition, there was continuous right hemispheric slowing which is consistent with neuronal dysfunction within the right hemisphere.       Tkeya Stencil, MD [facility]

## 2024-01-03 LAB — BASIC METABOLIC PANEL WITH GFR
BUN/Creatinine Ratio: 26 — ABNORMAL HIGH (ref 10–24)
BUN: 37 mg/dL — ABNORMAL HIGH (ref 8–27)
CO2: 19 mmol/L — ABNORMAL LOW (ref 20–29)
Calcium: 8.9 mg/dL (ref 8.6–10.2)
Chloride: 107 mmol/L — ABNORMAL HIGH (ref 96–106)
Creatinine, Ser: 1.45 mg/dL — ABNORMAL HIGH (ref 0.76–1.27)
Glucose: 144 mg/dL — ABNORMAL HIGH (ref 70–99)
Potassium: 4.3 mmol/L (ref 3.5–5.2)
Sodium: 140 mmol/L (ref 134–144)
eGFR: 52 mL/min/1.73 — ABNORMAL LOW (ref >59)

## 2024-01-03 LAB — VITAMIN D 25 HYDROXY (VIT D DEFICIENCY, FRACTURES): Vit D, 25-Hydroxy: 14.1 ng/mL — ABNORMAL LOW (ref 30.0–100.0)

## 2024-01-03 LAB — LACOSAMIDE: Lacosamide: 10.2 ug/mL — ABNORMAL HIGH (ref 5.0–10.0)

## 2024-01-03 LAB — LEVETIRACETAM LEVEL: Levetiracetam Lvl: 93.8 ug/mL — ABNORMAL HIGH (ref 10.0–40.0)

## 2024-01-06 ENCOUNTER — Ambulatory Visit: Payer: Self-pay | Admitting: Neurology

## 2024-01-06 MED ORDER — VITAMIN D (ERGOCALCIFEROL) 1.25 MG (50000 UNIT) PO CAPS: 50000.0000 [IU] | ORAL_CAPSULE | ORAL | 0 refills | Status: AC

## 2024-01-06 NOTE — Progress Notes (Signed)
 Please call and inform patient that his recent EEG (Brain wave test) was abnormal. It show epileptiform discharges and slowing in the right temporal region. This is consistent with his history of epilepsy. Please continue current medications, keep any upcoming appointments or tests and  call us  with any interim questions, concerns, problems or updates. Thanks,   Pastor Falling, MD

## 2024-01-06 NOTE — Progress Notes (Signed)
 Please call and advise the patient that the recent labs we checked showed a high level of Keppra  and Vimpat  but this reflect the high dose that he is taking. There is also evidence of dehydration, please advise patient to increase fluid intake.  Lastly, your Vitamin D  level was very low. I will start you on a Vitamin D  supplement to take weekly for the next 12 weeks. Please remind patient to keep any upcoming appointments or tests and to call us  with any interim questions, concerns, problems or updates. Thanks,   Pastor Falling, MD

## 2024-01-09 DIAGNOSIS — C61 Malignant neoplasm of prostate: Secondary | ICD-10-CM | POA: Diagnosis not present

## 2024-01-17 ENCOUNTER — Other Ambulatory Visit: Payer: Self-pay | Admitting: Neurology

## 2024-02-06 DIAGNOSIS — G4700 Insomnia, unspecified: Secondary | ICD-10-CM | POA: Diagnosis not present

## 2024-02-06 DIAGNOSIS — K7460 Unspecified cirrhosis of liver: Secondary | ICD-10-CM | POA: Diagnosis not present

## 2024-02-06 DIAGNOSIS — R52 Pain, unspecified: Secondary | ICD-10-CM | POA: Diagnosis not present

## 2024-02-06 DIAGNOSIS — Z299 Encounter for prophylactic measures, unspecified: Secondary | ICD-10-CM | POA: Diagnosis not present

## 2024-02-06 DIAGNOSIS — I10 Essential (primary) hypertension: Secondary | ICD-10-CM | POA: Diagnosis not present

## 2024-02-06 DIAGNOSIS — F17210 Nicotine dependence, cigarettes, uncomplicated: Secondary | ICD-10-CM | POA: Diagnosis not present

## 2024-02-06 DIAGNOSIS — E119 Type 2 diabetes mellitus without complications: Secondary | ICD-10-CM | POA: Diagnosis not present

## 2024-02-06 DIAGNOSIS — R569 Unspecified convulsions: Secondary | ICD-10-CM | POA: Diagnosis not present

## 2024-02-19 ENCOUNTER — Other Ambulatory Visit: Payer: Self-pay | Admitting: Neurology

## 2024-02-20 ENCOUNTER — Other Ambulatory Visit: Payer: Self-pay | Admitting: Neurology

## 2024-02-20 MED ORDER — LACOSAMIDE 100 MG PO TABS: 100.0000 mg | ORAL_TABLET | Freq: Two times a day (BID) | ORAL | 5 refills | Status: AC

## 2024-02-20 NOTE — Telephone Encounter (Signed)
 Dispensed Days Supply Quantity Provider Pharmacy  lacosamide  100 mg tablet 01/20/2024 30 60 tablet Camara, Amadou, MD [facility]  lacosamide  100 mg tablet 12/24/2023 30 60 tablet Camara, Amadou, MD [facility]  lacosamide  100 mg tablet 11/27/2023 30 60 tablet Camara, Amadou, MD [facility]  lacosamide  100 mg tablet 09/29/2023 30 60 tablet Camara, Amadou, MD [facility]  lacosamide  100 mg tablet 08/30/2023 30 60 tablet Camara, Amadou, MD [facility]  lacosamide  100 mg tablet 08/01/2023 30 60 tablet Gregg Lek, MD [facility]    Last visit 12/26/23 Next visit 08/19/24

## 2024-02-24 ENCOUNTER — Ambulatory Visit: Admitting: Urology

## 2024-03-04 DIAGNOSIS — I10 Essential (primary) hypertension: Secondary | ICD-10-CM | POA: Diagnosis not present

## 2024-03-04 DIAGNOSIS — Z299 Encounter for prophylactic measures, unspecified: Secondary | ICD-10-CM | POA: Diagnosis not present

## 2024-03-04 DIAGNOSIS — R52 Pain, unspecified: Secondary | ICD-10-CM | POA: Diagnosis not present

## 2024-03-04 DIAGNOSIS — Z1331 Encounter for screening for depression: Secondary | ICD-10-CM | POA: Diagnosis not present

## 2024-03-04 DIAGNOSIS — M25522 Pain in left elbow: Secondary | ICD-10-CM | POA: Diagnosis not present

## 2024-03-04 DIAGNOSIS — Z7189 Other specified counseling: Secondary | ICD-10-CM | POA: Diagnosis not present

## 2024-03-04 DIAGNOSIS — Z1339 Encounter for screening examination for other mental health and behavioral disorders: Secondary | ICD-10-CM | POA: Diagnosis not present

## 2024-03-04 DIAGNOSIS — M19029 Primary osteoarthritis, unspecified elbow: Secondary | ICD-10-CM | POA: Diagnosis not present

## 2024-03-04 DIAGNOSIS — Z0000 Encounter for general adult medical examination without abnormal findings: Secondary | ICD-10-CM | POA: Diagnosis not present

## 2024-03-25 ENCOUNTER — Other Ambulatory Visit: Payer: Self-pay | Admitting: Neurology

## 2024-04-03 DIAGNOSIS — R5383 Other fatigue: Secondary | ICD-10-CM | POA: Diagnosis not present

## 2024-04-03 DIAGNOSIS — E1165 Type 2 diabetes mellitus with hyperglycemia: Secondary | ICD-10-CM | POA: Diagnosis not present

## 2024-04-03 DIAGNOSIS — Z0000 Encounter for general adult medical examination without abnormal findings: Secondary | ICD-10-CM | POA: Diagnosis not present

## 2024-04-03 DIAGNOSIS — F419 Anxiety disorder, unspecified: Secondary | ICD-10-CM | POA: Diagnosis not present

## 2024-04-03 DIAGNOSIS — Z299 Encounter for prophylactic measures, unspecified: Secondary | ICD-10-CM | POA: Diagnosis not present

## 2024-04-03 DIAGNOSIS — R52 Pain, unspecified: Secondary | ICD-10-CM | POA: Diagnosis not present

## 2024-04-03 DIAGNOSIS — I10 Essential (primary) hypertension: Secondary | ICD-10-CM | POA: Diagnosis not present

## 2024-04-03 DIAGNOSIS — E7800 Pure hypercholesterolemia, unspecified: Secondary | ICD-10-CM | POA: Diagnosis not present

## 2024-04-03 DIAGNOSIS — Z79899 Other long term (current) drug therapy: Secondary | ICD-10-CM | POA: Diagnosis not present

## 2024-04-03 DIAGNOSIS — Z125 Encounter for screening for malignant neoplasm of prostate: Secondary | ICD-10-CM | POA: Diagnosis not present

## 2024-06-01 DIAGNOSIS — Z7984 Long term (current) use of oral hypoglycemic drugs: Secondary | ICD-10-CM | POA: Diagnosis not present

## 2024-06-01 DIAGNOSIS — I358 Other nonrheumatic aortic valve disorders: Secondary | ICD-10-CM | POA: Diagnosis not present

## 2024-06-01 DIAGNOSIS — G9340 Encephalopathy, unspecified: Secondary | ICD-10-CM | POA: Diagnosis not present

## 2024-06-01 DIAGNOSIS — M129 Arthropathy, unspecified: Secondary | ICD-10-CM | POA: Diagnosis not present

## 2024-06-01 DIAGNOSIS — E785 Hyperlipidemia, unspecified: Secondary | ICD-10-CM | POA: Diagnosis not present

## 2024-06-01 DIAGNOSIS — Z9181 History of falling: Secondary | ICD-10-CM | POA: Diagnosis not present

## 2024-06-01 DIAGNOSIS — G3184 Mild cognitive impairment, so stated: Secondary | ICD-10-CM | POA: Diagnosis not present

## 2024-06-01 DIAGNOSIS — I700 Atherosclerosis of aorta: Secondary | ICD-10-CM | POA: Diagnosis not present

## 2024-06-01 DIAGNOSIS — G40909 Epilepsy, unspecified, not intractable, without status epilepticus: Secondary | ICD-10-CM | POA: Diagnosis not present

## 2024-06-01 DIAGNOSIS — I509 Heart failure, unspecified: Secondary | ICD-10-CM | POA: Diagnosis not present

## 2024-06-01 DIAGNOSIS — B182 Chronic viral hepatitis C: Secondary | ICD-10-CM | POA: Diagnosis not present

## 2024-06-01 DIAGNOSIS — M2450 Contracture, unspecified joint: Secondary | ICD-10-CM | POA: Diagnosis not present

## 2024-06-01 DIAGNOSIS — F319 Bipolar disorder, unspecified: Secondary | ICD-10-CM | POA: Diagnosis not present

## 2024-06-01 DIAGNOSIS — Z833 Family history of diabetes mellitus: Secondary | ICD-10-CM | POA: Diagnosis not present

## 2024-06-01 DIAGNOSIS — F1421 Cocaine dependence, in remission: Secondary | ICD-10-CM | POA: Diagnosis not present

## 2024-06-01 DIAGNOSIS — F411 Generalized anxiety disorder: Secondary | ICD-10-CM | POA: Diagnosis not present

## 2024-06-01 DIAGNOSIS — E669 Obesity, unspecified: Secondary | ICD-10-CM | POA: Diagnosis not present

## 2024-06-01 DIAGNOSIS — E119 Type 2 diabetes mellitus without complications: Secondary | ICD-10-CM | POA: Diagnosis not present

## 2024-06-01 DIAGNOSIS — Z87820 Personal history of traumatic brain injury: Secondary | ICD-10-CM | POA: Diagnosis not present

## 2024-06-01 DIAGNOSIS — F1021 Alcohol dependence, in remission: Secondary | ICD-10-CM | POA: Diagnosis not present

## 2024-06-01 DIAGNOSIS — I110 Hypertensive heart disease with heart failure: Secondary | ICD-10-CM | POA: Diagnosis not present

## 2024-06-13 ENCOUNTER — Other Ambulatory Visit: Payer: Self-pay

## 2024-06-13 ENCOUNTER — Encounter (HOSPITAL_COMMUNITY): Payer: Self-pay

## 2024-06-13 ENCOUNTER — Emergency Department (HOSPITAL_COMMUNITY)
Admission: EM | Admit: 2024-06-13 | Discharge: 2024-06-13 | Disposition: A | Discharge status: Home / Self Care | Attending: Emergency Medicine | Admitting: Emergency Medicine

## 2024-06-13 DIAGNOSIS — I10 Essential (primary) hypertension: Secondary | ICD-10-CM | POA: Insufficient documentation

## 2024-06-13 DIAGNOSIS — R400 Somnolence: Secondary | ICD-10-CM | POA: Insufficient documentation

## 2024-06-13 DIAGNOSIS — Z8546 Personal history of malignant neoplasm of prostate: Secondary | ICD-10-CM | POA: Insufficient documentation

## 2024-06-13 DIAGNOSIS — R339 Retention of urine, unspecified: Secondary | ICD-10-CM | POA: Insufficient documentation

## 2024-06-13 DIAGNOSIS — Z79899 Other long term (current) drug therapy: Secondary | ICD-10-CM | POA: Insufficient documentation

## 2024-06-13 LAB — URINALYSIS, ROUTINE W REFLEX MICROSCOPIC
Bacteria, UA: NONE SEEN
Bilirubin Urine: NEGATIVE
Glucose, UA: NEGATIVE mg/dL
Ketones, ur: NEGATIVE mg/dL
Leukocytes,Ua: NEGATIVE
Nitrite: NEGATIVE
Protein, ur: 100 mg/dL — AB
Specific Gravity, Urine: 1.013 (ref 1.005–1.030)
pH: 5 (ref 5.0–8.0)

## 2024-06-13 LAB — COMPREHENSIVE METABOLIC PANEL WITH GFR
ALT: 19 U/L (ref 0–44)
AST: 23 U/L (ref 15–41)
Albumin: 4.4 g/dL (ref 3.5–5.0)
Alkaline Phosphatase: 108 U/L (ref 38–126)
Anion gap: 13 (ref 5–15)
BUN: 34 mg/dL — ABNORMAL HIGH (ref 8–23)
CO2: 19 mmol/L — ABNORMAL LOW (ref 22–32)
Calcium: 8.9 mg/dL (ref 8.9–10.3)
Chloride: 107 mmol/L (ref 98–111)
Creatinine, Ser: 1.37 mg/dL — ABNORMAL HIGH (ref 0.61–1.24)
GFR, Estimated: 56 mL/min — ABNORMAL LOW (ref >60)
Glucose, Bld: 100 mg/dL — ABNORMAL HIGH (ref 70–99)
Potassium: 5.2 mmol/L — ABNORMAL HIGH (ref 3.5–5.1)
Sodium: 139 mmol/L (ref 135–145)
Total Bilirubin: 0.7 mg/dL (ref 0.0–1.2)
Total Protein: 7.7 g/dL (ref 6.5–8.1)

## 2024-06-13 LAB — CBC WITH DIFFERENTIAL/PLATELET
Abs Immature Granulocytes: 0.01 K/uL (ref 0.00–0.07)
Basophils Absolute: 0 K/uL (ref 0.0–0.1)
Basophils Relative: 1 %
Eosinophils Absolute: 0.1 K/uL (ref 0.0–0.5)
Eosinophils Relative: 2 %
HCT: 38.7 % — ABNORMAL LOW (ref 39.0–52.0)
Hemoglobin: 13.5 g/dL (ref 13.0–17.0)
Immature Granulocytes: 0 %
Lymphocytes Relative: 23 %
Lymphs Abs: 1.7 K/uL (ref 0.7–4.0)
MCH: 33.3 pg (ref 26.0–34.0)
MCHC: 34.9 g/dL (ref 30.0–36.0)
MCV: 95.6 fL (ref 80.0–100.0)
Monocytes Absolute: 0.6 K/uL (ref 0.1–1.0)
Monocytes Relative: 8 %
Neutro Abs: 5.1 K/uL (ref 1.7–7.7)
Neutrophils Relative %: 66 %
Platelets: 127 K/uL — ABNORMAL LOW (ref 150–400)
RBC: 4.05 MIL/uL — ABNORMAL LOW (ref 4.22–5.81)
RDW: 13.4 % (ref 11.5–15.5)
WBC: 7.5 K/uL (ref 4.0–10.5)
nRBC: 0 % (ref 0.0–0.2)

## 2024-06-13 LAB — AMMONIA: Ammonia: 27 umol/L (ref 9–35)

## 2024-06-13 NOTE — Discharge Instructions (Signed)
 Your blood work and testing has been normal, your vital signs are normal, please drink more clear liquids and this will help you to urinate more frequently.  If you find that you are having increasing difficulty urinating pain fevers vomiting or any other worsening symptoms return to the ER otherwise you can see your family doctor this week  Thank you for allowing us  to treat you in the emergency department today.  After reviewing your examination and potential testing that was done it appears that you are safe to go home.  I would like for you to follow-up with your doctor within the next several days, have them obtain your records and follow-up with them to review all potential tests and results from your visit.  If you should develop severe or worsening symptoms return to the emergency department immediately

## 2024-06-13 NOTE — ED Triage Notes (Signed)
 Pt BIB for urinary retention since last night. Pt has hx of prostate cancer. Pt has no other complaints at this time.

## 2024-06-13 NOTE — ED Provider Notes (Signed)
 [place] EMERGENCY DEPARTMENT AT [facility] Provider Note   CSN: [id number] Arrival date & time: 06/13/24  1548     Patient presents with: Urinary Retention   Juan Juarez is a 68 y.o. male.   HPI   This patient is a 68 year old male with a history of alcoholic liver disease, high cholesterol, he also has a history of traumatic brain injury and seizure disorder from it.  He is hypertensive on lisinopril  and has a history of prostate cancer s/p radiation therapy.  He is presenting to the hospital with a complaint of not being able to urinate today.  He said he had a feeling that he needed to urinate but could not this morning.  States that he drinks a steady diet of Diet Coke and water .  He denies fevers chills nausea vomiting or abdominal pain at all.  His lower extremities are nonswollen and the patient denies having any headaches chest pain coughing or shortness of breath.  Prior to Admission medications  Medication Sig Start Date End Date Taking? Authorizing Provider  atorvastatin  (LIPITOR) 80 MG tablet Take 80 mg by mouth daily. 05/28/23   [provider]  blood glucose meter kit and supplies Dispense based on patient and insurance preference. Use up to four times daily as directed. (FOR ICD-10 E10.9, E11.9). 09/03/18   Johnson, Clanford L, MD  clonazePAM (KLONOPIN) 0.5 MG tablet Take 0.5 mg by mouth daily. 03/17/21   [provider]  Lacosamide  100 MG TABS Take 1 tablet (100 mg total) by mouth 2 (two) times daily. take 1 tablet (100 MILLIGRAM total) by mouth 2 (two) times daily. 02/20/24   Penumalli, Vikram R, MD  levETIRAcetam  (KEPPRA ) 750 MG tablet take 2 tablets (1,500 milligram total) by mouth 2 (two) times daily. 03/25/24   Camara, Amadou, MD  lisinopril  (ZESTRIL ) 20 MG tablet Take 20 mg by mouth daily.    [provider]  thiamine  (VITAMIN B-1) 50 MG tablet Take 50 mg by mouth daily.    [provider]  Vitamin D , Ergocalciferol ,  (DRISDOL ) 1.25 MG (50000 UNIT) CAPS capsule Take 1 capsule (50,000 Units total) by mouth every 7 (seven) days. 01/06/24   Camara, Amadou, MD    Allergies: Patient has no known allergies.    Review of Systems  All other systems reviewed and are negative.   Updated Vital Signs BP 139/75 (BP Location: Right Arm)   Pulse 62   Temp 98.3 F (36.8 C) (Oral)   Resp 16   Ht 1.702 m (5' 7)   Wt 86.6 kg   SpO2 98%   BMI 29.91 kg/m   Physical Exam Vitals and nursing note reviewed.  Constitutional:      General: He is not in acute distress.    Appearance: He is well-developed.  HENT:     Head: Normocephalic and atraumatic.     Mouth/Throat:     Pharynx: No oropharyngeal exudate.  Eyes:     General: No scleral icterus.       Right eye: No discharge.        Left eye: No discharge.     Conjunctiva/sclera: Conjunctivae normal.     Pupils: Pupils are equal, round, and reactive to light.  Neck:     Thyroid : No thyromegaly.     Vascular: No JVD.  Cardiovascular:     Rate and Rhythm: Normal rate and regular rhythm.     Heart sounds: Normal heart sounds. No murmur heard.    No  friction rub. No gallop.  Pulmonary:     Effort: Pulmonary effort is normal. No respiratory distress.     Breath sounds: Normal breath sounds. No wheezing or rales.  Abdominal:     General: Bowel sounds are normal. There is no distension.     Palpations: Abdomen is soft. There is no mass.     Tenderness: There is no abdominal tenderness.  Genitourinary:    Comments: Normal-appearing penis scrotum and testicles, uncircumcised, foreskin is able to be retracted to reveal a normal-appearing penile head, there is no tenderness masses erythema induration inguinal hernias and there is no tenderness in the lower abdomen Musculoskeletal:        General: No tenderness. Normal range of motion.     Cervical back: Normal range of motion and neck supple.  Lymphadenopathy:     Cervical: No cervical adenopathy.  Skin:     General: Skin is warm and dry.     Findings: No erythema or rash.  Neurological:     Mental Status: He is alert.     Coordination: Coordination normal.  Psychiatric:        Behavior: Behavior normal.     (all labs ordered are listed, but only abnormal results are displayed) Labs Reviewed  URINALYSIS, ROUTINE W REFLEX MICROSCOPIC - Abnormal; Notable for the following components:      Result Value   Hgb urine dipstick SMALL (*)    Protein, ur 100 (*)    All other components within normal limits  COMPREHENSIVE METABOLIC PANEL WITH GFR - Abnormal; Notable for the following components:   Potassium 5.2 (*)    CO2 19 (*)    Glucose, Bld 100 (*)    BUN 34 (*)    Creatinine, Ser 1.37 (*)    GFR, Estimated 56 (*)    All other components within normal limits  CBC WITH DIFFERENTIAL/PLATELET - Abnormal; Notable for the following components:   RBC 4.05 (*)    HCT 38.7 (*)    Platelets 127 (*)    All other components within normal limits  URINE CULTURE  AMMONIA    EKG: None  Radiology: No results found.   Procedures   Medications Ordered in the ED - No data to display                                  Medical Decision Making Amount and/or Complexity of Data Reviewed Labs: ordered.   = The patient does appear slightly somnolent but is able to answer all my questions and follow commands.  He does have a history of alcoholic liver disease, his vital signs are unremarkable, the presentation is not necessarily consistent with acute urinary retention so a bladder scan was performed and the patient will have an In-N-Out catheterization to measure and quantify his urine quantity.  Urine will be sent, labs will be sent, check ammonia given his history, again vital signs unremarkable  Labs:  I  personally viewed and interpreted the labs which show a metabolic panel which shows an improved creatinine over time, minimal hyperkalemia which is of no clinical significance, CBC showing no  anemia no leukocytosis and mild chronic thrombocytopenia, ammonia is normal at 27 and the urinalysis reveals some protein but no signs of infection or hematuria.  The patient been able to ambulate several times around the ER without any difficulty, he awake alert and understands indications for return, I suspect he  does actually have urinary retention but more likely had decreased urinary volume as he only had 100 cc of urine in the bladder.      Final diagnoses:  Urinary retention    ED Discharge Orders     None          Cleotilde Rogue, MD 06/13/24 [phone]

## 2024-06-13 NOTE — ED Notes (Signed)
 Call to pt daughter who is unable to come as she is currently in [place].  She advises to call pt sister to pick him up.  Pt sister will come but it will be around 8, pt to wait in room for his safety and will walk him out once sister calls me that she is here.  Pt is in no distress.

## 2024-06-15 LAB — URINE CULTURE: Culture: NO GROWTH

## 2024-07-10 ENCOUNTER — Ambulatory Visit: Admitting: Urology

## 2024-08-19 ENCOUNTER — Ambulatory Visit: Admitting: Neurology
